# Patient Record
Sex: Female | Born: 1935 | Race: White | Hispanic: No | Marital: Married | Smoking: Never smoker
Health system: Southern US, Community
[De-identification: ages and names within clinical notes are randomized; demographics above are authoritative.]

## PROBLEM LIST (undated history)

## (undated) DIAGNOSIS — E7800 Pure hypercholesterolemia, unspecified: Secondary | ICD-10-CM

## (undated) DIAGNOSIS — J189 Pneumonia, unspecified organism: Secondary | ICD-10-CM

## (undated) DIAGNOSIS — I420 Dilated cardiomyopathy: Secondary | ICD-10-CM

## (undated) DIAGNOSIS — C801 Malignant (primary) neoplasm, unspecified: Secondary | ICD-10-CM

## (undated) DIAGNOSIS — M810 Age-related osteoporosis without current pathological fracture: Secondary | ICD-10-CM

## (undated) DIAGNOSIS — I471 Supraventricular tachycardia, unspecified: Secondary | ICD-10-CM

## (undated) DIAGNOSIS — K219 Gastro-esophageal reflux disease without esophagitis: Secondary | ICD-10-CM

## (undated) DIAGNOSIS — T4145XA Adverse effect of unspecified anesthetic, initial encounter: Secondary | ICD-10-CM

## (undated) DIAGNOSIS — D869 Sarcoidosis, unspecified: Secondary | ICD-10-CM

## (undated) DIAGNOSIS — M1990 Unspecified osteoarthritis, unspecified site: Secondary | ICD-10-CM

## (undated) DIAGNOSIS — Z9889 Other specified postprocedural states: Secondary | ICD-10-CM

## (undated) DIAGNOSIS — R112 Nausea with vomiting, unspecified: Secondary | ICD-10-CM

## (undated) DIAGNOSIS — I351 Nonrheumatic aortic (valve) insufficiency: Secondary | ICD-10-CM

## (undated) DIAGNOSIS — I890 Lymphedema, not elsewhere classified: Secondary | ICD-10-CM

## (undated) DIAGNOSIS — I2510 Atherosclerotic heart disease of native coronary artery without angina pectoris: Secondary | ICD-10-CM

## (undated) DIAGNOSIS — I5042 Chronic combined systolic (congestive) and diastolic (congestive) heart failure: Secondary | ICD-10-CM

## (undated) DIAGNOSIS — M7989 Other specified soft tissue disorders: Secondary | ICD-10-CM

## (undated) DIAGNOSIS — N281 Cyst of kidney, acquired: Secondary | ICD-10-CM

## (undated) DIAGNOSIS — I480 Paroxysmal atrial fibrillation: Secondary | ICD-10-CM

## (undated) DIAGNOSIS — I350 Nonrheumatic aortic (valve) stenosis: Secondary | ICD-10-CM

## (undated) DIAGNOSIS — I340 Nonrheumatic mitral (valve) insufficiency: Secondary | ICD-10-CM

## (undated) DIAGNOSIS — K589 Irritable bowel syndrome without diarrhea: Secondary | ICD-10-CM

## (undated) DIAGNOSIS — I10 Essential (primary) hypertension: Secondary | ICD-10-CM

## (undated) DIAGNOSIS — G709 Myoneural disorder, unspecified: Secondary | ICD-10-CM

## (undated) DIAGNOSIS — I700 Atherosclerosis of aorta: Secondary | ICD-10-CM

## (undated) HISTORY — DX: Dilated cardiomyopathy: I42.0

## (undated) HISTORY — DX: Sarcoidosis, unspecified: D86.9

## (undated) HISTORY — DX: Supraventricular tachycardia, unspecified: I47.10

## (undated) HISTORY — DX: Nonrheumatic mitral (valve) insufficiency: I34.0

## (undated) HISTORY — DX: Paroxysmal atrial fibrillation: I48.0

## (undated) HISTORY — DX: Chronic combined systolic (congestive) and diastolic (congestive) heart failure: I50.42

## (undated) HISTORY — DX: Atherosclerotic heart disease of native coronary artery without angina pectoris: I25.10

## (undated) HISTORY — DX: Cyst of kidney, acquired: N28.1

## (undated) HISTORY — DX: Nonrheumatic aortic (valve) insufficiency: I35.1

## (undated) HISTORY — PX: EYE SURGERY: SHX253

## (undated) HISTORY — DX: Irritable bowel syndrome, unspecified: K58.9

## (undated) HISTORY — DX: Supraventricular tachycardia: I47.1

## (undated) HISTORY — DX: Pneumonia, unspecified organism: J18.9

## (undated) HISTORY — DX: Unspecified osteoarthritis, unspecified site: M19.90

## (undated) HISTORY — DX: Age-related osteoporosis without current pathological fracture: M81.0

## (undated) HISTORY — DX: Pure hypercholesterolemia, unspecified: E78.00

## (undated) HISTORY — DX: Lymphedema, not elsewhere classified: I89.0

---

## 1995-05-14 HISTORY — PX: BREAST REDUCTION SURGERY: SHX8

## 1999-02-15 ENCOUNTER — Ambulatory Visit (HOSPITAL_COMMUNITY): Admission: RE | Admit: 1999-02-15 | Discharge: 1999-02-15 | Payer: Self-pay | Admitting: *Deleted

## 1999-02-15 ENCOUNTER — Encounter: Payer: Self-pay | Admitting: *Deleted

## 1999-03-14 ENCOUNTER — Encounter: Payer: Self-pay | Admitting: *Deleted

## 1999-03-14 ENCOUNTER — Ambulatory Visit (HOSPITAL_COMMUNITY): Admission: RE | Admit: 1999-03-14 | Discharge: 1999-03-14 | Payer: Self-pay | Admitting: *Deleted

## 1999-05-03 ENCOUNTER — Ambulatory Visit (HOSPITAL_COMMUNITY): Admission: RE | Admit: 1999-05-03 | Discharge: 1999-05-03 | Payer: Self-pay | Admitting: *Deleted

## 1999-05-03 ENCOUNTER — Encounter (INDEPENDENT_AMBULATORY_CARE_PROVIDER_SITE_OTHER): Payer: Self-pay | Admitting: *Deleted

## 2000-06-10 ENCOUNTER — Other Ambulatory Visit: Admission: RE | Admit: 2000-06-10 | Discharge: 2000-06-10 | Payer: Self-pay | Admitting: *Deleted

## 2002-07-12 ENCOUNTER — Ambulatory Visit (HOSPITAL_COMMUNITY): Admission: RE | Admit: 2002-07-12 | Discharge: 2002-07-12 | Payer: Self-pay | Admitting: Specialist

## 2005-12-19 ENCOUNTER — Other Ambulatory Visit: Admission: RE | Admit: 2005-12-19 | Discharge: 2005-12-19 | Payer: Self-pay | Admitting: Family Medicine

## 2008-12-27 ENCOUNTER — Ambulatory Visit: Payer: Self-pay | Admitting: Diagnostic Radiology

## 2008-12-27 ENCOUNTER — Encounter: Payer: Self-pay | Admitting: Emergency Medicine

## 2008-12-27 ENCOUNTER — Inpatient Hospital Stay (HOSPITAL_COMMUNITY): Admission: AD | Admit: 2008-12-27 | Discharge: 2008-12-31 | Payer: Self-pay | Admitting: Internal Medicine

## 2009-01-07 ENCOUNTER — Emergency Department (HOSPITAL_COMMUNITY): Admission: EM | Admit: 2009-01-07 | Discharge: 2009-01-07 | Payer: Self-pay | Admitting: Emergency Medicine

## 2010-05-16 ENCOUNTER — Emergency Department (HOSPITAL_COMMUNITY)
Admission: EM | Admit: 2010-05-16 | Discharge: 2010-05-17 | Payer: Self-pay | Source: Home / Self Care | Admitting: Emergency Medicine

## 2010-05-16 ENCOUNTER — Ambulatory Visit
Admission: RE | Admit: 2010-05-16 | Discharge: 2010-05-16 | Payer: Self-pay | Source: Home / Self Care | Attending: Orthopedic Surgery | Admitting: Orthopedic Surgery

## 2010-05-16 LAB — POCT I-STAT, CHEM 8
BUN: 25 mg/dL — ABNORMAL HIGH (ref 6–23)
Calcium, Ion: 1.08 mmol/L — ABNORMAL LOW (ref 1.12–1.32)
Chloride: 106 mEq/L (ref 96–112)
Creatinine, Ser: 1 mg/dL (ref 0.4–1.2)
Glucose, Bld: 196 mg/dL — ABNORMAL HIGH (ref 70–99)
HCT: 41 % (ref 36.0–46.0)
Hemoglobin: 13.9 g/dL (ref 12.0–15.0)
Potassium: 4.7 mEq/L (ref 3.5–5.1)
Sodium: 139 mEq/L (ref 135–145)
TCO2: 28 mmol/L (ref 0–100)

## 2010-05-16 LAB — POCT HEMOGLOBIN-HEMACUE: Hemoglobin: 14.3 g/dL (ref 12.0–15.0)

## 2010-05-26 ENCOUNTER — Encounter (INDEPENDENT_AMBULATORY_CARE_PROVIDER_SITE_OTHER): Payer: Self-pay | Admitting: Internal Medicine

## 2010-05-26 ENCOUNTER — Observation Stay (HOSPITAL_COMMUNITY)
Admission: EM | Admit: 2010-05-26 | Discharge: 2010-05-28 | Payer: Self-pay | Source: Home / Self Care | Attending: Internal Medicine | Admitting: Internal Medicine

## 2010-05-27 ENCOUNTER — Encounter (INDEPENDENT_AMBULATORY_CARE_PROVIDER_SITE_OTHER): Payer: Self-pay | Admitting: Internal Medicine

## 2010-05-28 LAB — HEMOGLOBIN A1C
Hgb A1c MFr Bld: 5.8 % — ABNORMAL HIGH (ref <5.7)
Mean Plasma Glucose: 120 mg/dL — ABNORMAL HIGH (ref <117)

## 2010-05-28 LAB — CBC
HCT: 39.9 % (ref 36.0–46.0)
HCT: 37.2 % (ref 36.0–46.0)
Hemoglobin: 12.1 g/dL (ref 12.0–15.0)
Hemoglobin: 11.6 g/dL — ABNORMAL LOW (ref 12.0–15.0)
MCH: 27.2 pg (ref 26.0–34.0)
MCH: 28.2 pg (ref 26.0–34.0)
MCHC: 30.3 g/dL (ref 30.0–36.0)
MCHC: 31.2 g/dL (ref 30.0–36.0)
MCV: 89.7 fL (ref 78.0–100.0)
MCV: 90.3 fL (ref 78.0–100.0)
Platelets: 267 10*3/uL (ref 150–400)
Platelets: 238 10*3/uL (ref 150–400)
RBC: 4.45 MIL/uL (ref 3.87–5.11)
RBC: 4.12 MIL/uL (ref 3.87–5.11)
RDW: 15 % (ref 11.5–15.5)
RDW: 15.3 % (ref 11.5–15.5)
WBC: 8.6 10*3/uL (ref 4.0–10.5)
WBC: 8 10*3/uL (ref 4.0–10.5)

## 2010-05-28 LAB — BASIC METABOLIC PANEL
BUN: 26 mg/dL — ABNORMAL HIGH (ref 6–23)
BUN: 18 mg/dL (ref 6–23)
CO2: 25 mEq/L (ref 19–32)
CO2: 27 mEq/L (ref 19–32)
Calcium: 9.4 mg/dL (ref 8.4–10.5)
Calcium: 8.6 mg/dL (ref 8.4–10.5)
Chloride: 101 mEq/L (ref 96–112)
Chloride: 110 mEq/L (ref 96–112)
Creatinine, Ser: 1.18 mg/dL (ref 0.4–1.2)
Creatinine, Ser: 1.29 mg/dL — ABNORMAL HIGH (ref 0.4–1.2)
GFR calc Af Amer: 54 mL/min — ABNORMAL LOW (ref >60)
GFR calc Af Amer: 49 mL/min — ABNORMAL LOW (ref >60)
GFR calc non Af Amer: 45 mL/min — ABNORMAL LOW (ref >60)
GFR calc non Af Amer: 40 mL/min — ABNORMAL LOW (ref >60)
Glucose, Bld: 126 mg/dL — ABNORMAL HIGH (ref 70–99)
Glucose, Bld: 104 mg/dL — ABNORMAL HIGH (ref 70–99)
Potassium: 3.9 mEq/L (ref 3.5–5.1)
Potassium: 4.2 mEq/L (ref 3.5–5.1)
Sodium: 134 mEq/L — ABNORMAL LOW (ref 135–145)
Sodium: 142 mEq/L (ref 135–145)

## 2010-05-28 LAB — DIFFERENTIAL
Basophils Absolute: 0 10*3/uL (ref 0.0–0.1)
Basophils Relative: 0 % (ref 0–1)
Eosinophils Absolute: 0.2 10*3/uL (ref 0.0–0.7)
Eosinophils Relative: 2 % (ref 0–5)
Lymphocytes Relative: 21 % (ref 12–46)
Lymphs Abs: 1.8 10*3/uL (ref 0.7–4.0)
Monocytes Absolute: 0.9 10*3/uL (ref 0.1–1.0)
Monocytes Relative: 10 % (ref 3–12)
Neutro Abs: 5.7 10*3/uL (ref 1.7–7.7)
Neutrophils Relative %: 66 % (ref 43–77)

## 2010-05-28 LAB — LIPID PANEL
Cholesterol: 168 mg/dL (ref 0–200)
HDL: 51 mg/dL (ref >39)
LDL Cholesterol: 98 mg/dL (ref 0–99)
Total CHOL/HDL Ratio: 3.3 RATIO
Triglycerides: 95 mg/dL (ref <150)
VLDL: 19 mg/dL (ref 0–40)

## 2010-05-28 LAB — PROTIME-INR
INR: 1.07 (ref 0.00–1.49)
Prothrombin Time: 14.1 seconds (ref 11.6–15.2)

## 2010-05-28 LAB — POCT CARDIAC MARKERS
CKMB, poc: 1.1 ng/mL (ref 1.0–8.0)
Myoglobin, poc: 92.2 ng/mL (ref 12–200)
Troponin i, poc: <0.05 ng/mL (ref 0.00–0.09)

## 2010-05-28 LAB — CK TOTAL AND CKMB (NOT AT ARMC)
CK, MB: 1.5 ng/mL (ref 0.3–4.0)
Relative Index: INVALID (ref 0.0–2.5)
Total CK: 40 U/L (ref 7–177)

## 2010-05-28 LAB — CARDIAC PANEL(CRET KIN+CKTOT+MB+TROPI)
CK, MB: 1.3 ng/mL (ref 0.3–4.0)
CK, MB: 1.3 ng/mL (ref 0.3–4.0)
Relative Index: INVALID (ref 0.0–2.5)
Relative Index: INVALID (ref 0.0–2.5)
Total CK: 39 U/L (ref 7–177)
Total CK: 38 U/L (ref 7–177)
Troponin I: 0.12 ng/mL — ABNORMAL HIGH (ref 0.00–0.06)
Troponin I: 0.11 ng/mL — ABNORMAL HIGH (ref 0.00–0.06)

## 2010-05-28 LAB — TROPONIN I: Troponin I: 0.17 ng/mL — ABNORMAL HIGH (ref 0.00–0.06)

## 2010-05-28 LAB — APTT: aPTT: 41 seconds — ABNORMAL HIGH (ref 24–37)

## 2010-05-30 LAB — BASIC METABOLIC PANEL
BUN: 16 mg/dL (ref 6–23)
CO2: 25 mEq/L (ref 19–32)
Calcium: 8.6 mg/dL (ref 8.4–10.5)
Chloride: 108 mEq/L (ref 96–112)
Creatinine, Ser: 1.23 mg/dL — ABNORMAL HIGH (ref 0.4–1.2)
GFR calc Af Amer: 51 mL/min — ABNORMAL LOW (ref >60)
GFR calc non Af Amer: 42 mL/min — ABNORMAL LOW (ref >60)
Glucose, Bld: 106 mg/dL — ABNORMAL HIGH (ref 70–99)
Potassium: 4 mEq/L (ref 3.5–5.1)
Sodium: 142 mEq/L (ref 135–145)

## 2010-05-30 LAB — CBC
HCT: 37.2 % (ref 36.0–46.0)
Hemoglobin: 11.6 g/dL — ABNORMAL LOW (ref 12.0–15.0)
MCH: 28.1 pg (ref 26.0–34.0)
MCHC: 31.2 g/dL (ref 30.0–36.0)
MCV: 90.1 fL (ref 78.0–100.0)
Platelets: 224 10*3/uL (ref 150–400)
RBC: 4.13 MIL/uL (ref 3.87–5.11)
RDW: 15.4 % (ref 11.5–15.5)
WBC: 7 10*3/uL (ref 4.0–10.5)

## 2010-05-30 LAB — MAGNESIUM: Magnesium: 2.3 mg/dL (ref 1.5–2.5)

## 2010-06-02 NOTE — H&P (Signed)
NAME:  DEDRICK, KOTAS             ACCOUNT NO.:  [id number]  MEDICAL RECORD NO.:  [id number]          PATIENT TYPE:  EMS  LOCATION:  MAJO                         FACILITY:  [facility]  PHYSICIAN:  Thornton Dales, MD   DATE OF BIRTH:  04/06/1932  DATE OF ADMISSION:  05/26/2010 DATE OF DISCHARGE:                             HISTORY & PHYSICAL   PRIMARY CARE PHYSICIAN:  Emeline General. White, MD  CHIEF COMPLAINT:  Chest discomfort and right leg swelling.  HISTORY OF PRESENT ILLNESS:  This is a pleasant 77 year old woman, who has no prior history of coronary artery disease.  She presented to the emergency room with feeling of indigestion and chest pressure, which is not accompanied with nausea or vomiting or diaphoresis.  In the emergency room, she got some treatment with improvement of pain.  EKG at initial evaluation was nondiagnostic for  myocardial infarction. Initial set of cardiac enzymes were also negative for myocardial injury or infarction.  The patient had arthroscopic surgery over 2 weeks ago for two torn cartilages and had been doing well postoperatively until the past few days when she noticed pain and swelling of the right leg. She denied any fever or trauma.  There is a concern that she may have developed deep vein thrombosis because of limited mobility.  The patient has never had prior history of myocardial infarction and she had been worked up in the past for coronary artery disease.  PAST MEDICAL HISTORY: 1. Osteoarthritis. 2. Obesity. 3. Irritable bowel syndrome. 4. Hypercholesterolemia. 5. Gastric reflux. 6. Cataract. 7. Allergies rhinitis.  She has no prior history of diabetes or high blood pressure.  MEDICATION: 1. Prilosec 20 mg daily. 2. Vitamin D3 one tablet daily. 3. Hydromorphone 2 mg q.4 to q.6 p.r.n. 4. Methocarbamol 500 mg t.i.d.  ALLERGIES:  None.  SOCIAL HISTORY:  No smoking or alcohol use.  The patient lives with husband.  Ten-point review of  systems is negative except as above.  FAMILY HISTORY:  Further died of heart attack in his early 52's.  PHYSICAL EXAMINATION:  GENERAL:  The patient is lying in bed comfortably in no distress. VITAL SIGNS:  Blood pressure is 133/91, pulse 77, respirations 20, temperature 97.8, O2 sat is 97% on room air. HEENT:  Pallor. NECK:  Supple.  No JVD. LUNGS:  Clear to auscultation bilaterally.  No wheezing or crackles. HEART:  S1, S2 normal.  No murmurs, rubs, or gallops. ABDOMEN:  Full, soft, obese, nontender.  Bowel sounds present.  No masses. EXTREMITIES:  No edema, clubbing, or cyanosis.  There is no edema or cyanosis on the left side, but on the right side, there is some swelling of the right leg and calf region.  There is no erythema.  The right knee also appear mildly swollen.  LABORATORY:  White count is normal at 8.6, hemoglobin 12.4, platelet count is 267.  Chemistries:  Sodium is 134, potassium 3.9, BUN 26, creatinine 1.18, glucose is 126.  Cardiac enzymes x1 is negative.  Chest x-ray showed left scarring, right thoracic spondylosis, no acute findings.  EKG showed normal sinus rhythm with no evidence of ischemia.  ASSESSMENT:  This  is a 77 year old woman admitted with: 1. Chest discomfort, rule out acute coronary syndrome.  Her risk     factors include hyperlipidemia and high blood pressure.  She has no     prior history of high blood pressure, but her blood pressure today     is 133/91.  It is likely that pain might be responsible for this,     but as described, she has underlying high blood pressure that is     currently not being treated.  In view of possible coronary artery     disease risk factors, the patient will be admitted as observation     for serial cardiac enzymes and EKG.  She will also get stress echo     if enzymes are negative.  We will put her on aspirin, p.r.n.     nitroglycerin, and keep her on telemetry floor. 2. Newly diagnosed high blood pressure:  The  patient will be started     on Lopressor 25 mg b.i.d. 3. Right leg swelling:  To rule out deep venous thrombosis.  The     patient got one dose of Lovenox in the emergency room.  I will do the protection of right leg, keep deep venous thrombosis prophylaxis for now.  If the deep venous thrombosis is confirmed, she can be changed to full-dose Lovenox. 1. Hyperlipidemia:  Lipid panel will be checked.  She is currently not     on any anti-lipid medication.  This will probably be started if her     LDL is not at the target, as excepted it takes about 1-2 days.     Thornton Dales, MD     FA/MEDQ  D:  05/26/2010  T:  05/26/2010  Job:  [id number]  Electronically Signed by Thornton Dales  on 06/02/2010 10:46:58 PM

## 2010-06-07 NOTE — Consult Note (Addendum)
NAME:  Heather Benson, Heather Benson             ACCOUNT NO.:  [id number]  MEDICAL RECORD NO.:  [id number]          PATIENT TYPE:  OBV  LOCATION:  4729                         FACILITY:  [facility]  PHYSICIAN:  Minus Breeding, MD, FACCDATE OF BIRTH:  04/19/1932  DATE OF CONSULTATION:  05/26/2010 DATE OF DISCHARGE:                                CONSULTATION   PRIMARY CARE PHYSICIAN:  Emeline General. White, MD  CARDIOLOGIST:  None.  REASON FOR CONSULTATION:  Evaluate the patient with elevated troponin.  HISTORY OF PRESENT ILLNESS:  The patient is a 77 year old white female without prior cardiac history.  She came to the ER yesterday after an episode of "indigestion" while lying in bed.  It "scared her."  It was 5/10 in intensity.  There was a heaviness to it.  It was substernal. There was no radiation to her jaw or to her arms.  She never had anything like this before.  There was no nausea, vomiting, diaphoresis, shortness of breath, or other associated symptoms.  Prior to get into the emergency room, she took some Tums without help.  In the emergency room, a GI cocktail did not improve her symptoms.  She did not have any acute ST-segment changes.  However, troponins have been slightly elevated initially at 0.17, drifting down to 0.11.  CK-MB has been negative x2.  Of note, the patient has had recent arthroscopic right knee surgery. She has been slow to recover from this.  She had been ambulating with a walker.  She has had some right lower leg anterior tibial discomfort with walking.  However, she is not describing any calf tenderness or swelling.  PAST MEDICAL HISTORY:  Osteoarthritis, irritable bowel syndrome, obesity, renal cyst, melanoma, polio with left leg weakness, gastroesophageal reflux disease, osteoporosis.  PAST SURGICAL HISTORY:  Melanoma resected, foot surgery many years ago, partial hysterectomy, arthroscopic knee surgery.  ALLERGIES AND INTOLERANCES:  CODEINE.  MEDICATIONS  (PRIOR TO ADMISSION):  Prilosec, vitamin D3, hydromorphone, methocarbamol.  SOCIAL HISTORY:  The patient lives with her husband.  She never smoked cigarettes.  FAMILY HISTORY:  Father had "heart trouble" in his 42s.  Her mother died at later age with heart failure.  REVIEW OF SYSTEMS:  As stated in the HPI and positive for diarrhea. Negative for all other systems.  PHYSICAL EXAMINATION:  GENERAL:  The patient is pleasant and in no distress. VITAL SIGNS:  Blood pressure 125/64, heart rate 66 and regular, afebrile, respiratory rate 16, 96% saturation on room air. HEENT:  Eyes unremarkable.  Pupils equal, round, and reactive to light. Fundi not visualized.  Oral mucosa unremarkable.  Upper dentures. NECK:  No jugular venous distention at 45 degrees.  Carotid upstroke brisk and symmetrical.  No bruits or thyromegaly. LYMPHATICS:  No cervical, axillary, inguinal adenopathy. LUNGS:  Clear to auscultation bilaterally. BACK:  No costovertebral angle tenderness. CHEST:  Unremarkable. HEART:  PMI not displaced or sustained.  S1 and S2 within normal limits. No S3, no S4, 2/6 apical brief systolic murmur, radiating slightly at the aortic outflow tract, no diastolic murmurs. ABDOMEN:  Obese, positive bowel sounds, normal in frequency and pitch, no bruits, no rebound,  no guarding, no midline pulsatile mass, no hepatomegaly, no splenomegaly. SKIN:  No rashes, no nodules. EXTREMITIES:  2+ pulses, mild swelling right greater than left lower leg without calf tenderness, mild tenderness to palpation, anterior tibia. NEUROLOGIC:  Oriented to person, place, and time.  Cranial nerves II-XII grossly intact.  Motor grossly intact.  EKG, normal sinus rhythm, rate 61, left ventricular hypertrophy, left axis deviation, inferior T-wave inversions, possible ischemia.  Chest x-ray, basilar scarring with tortuous aorta.  LABS:  Sodium 134, potassium 0.9, BUN 26, creatinine 1.18.  WBC 8.6, hemoglobin 12.1,  platelets 267.  Hemoglobin A1c 5.8.  ASSESSMENT AND PLAN: 1. Chest discomfort.  The patient's chest discomfort does have some     typical features.  She does have a mildly elevated troponin.  With     her recent orthopedic surgery and lower leg discomfort, we need to     first think about pulmonary emboli.  I think probability is rather     high and so I would proceed directly to CT to exclude this.  She is     being treated currently with aspirin and Lovenox.  She is not     currently having any pain although nitroglycerin has been added.     If her chest CT does not demonstrate a pulmonary emboli, I will     suggest cardiac catheterization. 2. Dyslipidemia.  She needs a lipid profile and risk reduction 3. Obesity.  She can be counseled about this as she recovers from her     knee surgery.     Minus Breeding, MD, [facility]     JH/MEDQ  D:  05/26/2010  T:  05/27/2010  Job:  [id number]  cc:   Emeline General. Dema Severin, M.D.  Electronically Signed by Minus Breeding MD [facility] on 06/07/2010 12:29:17 PM

## 2010-06-07 NOTE — Discharge Summary (Addendum)
NAME:  Heather Benson, Heather Benson             ACCOUNT NO.:  [id number]  MEDICAL RECORD NO.:  [id number]          PATIENT TYPE:  EMS  LOCATION:  ED                           FACILITY:  [facility]  PHYSICIAN:  Hosie Poisson, MD       DATE OF BIRTH:  07-May-1932  DATE OF ADMISSION:  05/16/2010 DATE OF DISCHARGE:  05/17/2010                              DISCHARGE SUMMARY   PRIMARY CARE PHYSICIAN:  Dr. Vidal Schwalbe, [facility].  DISCHARGE DIAGNOSIS:  Chest pain.  OTHER DIAGNOSES: 1. Osteoarthritis, both knee. 2. Irritable bowel syndrome. 3. Hyperlipidemia. 4. Gastroesophageal reflux disease. 5. Allergic rhinitis.  DISCHARGE MEDICATIONS: 1. Protonix 40 mg daily. 2. Methocarbamol 500 mg t.i.d. 3. Hydromorphone 2 mg q.6 h. p.r.n. 4. Metoprolol 25 mg daily. 5. Aspirin 81 mg daily.  CONSULTS CALLED:  Cardiology consults and [facility].  PROCEDURES DONE: 1. Cardiac catheterization. 2. Venous duplex of the right lower extremity.  PERTINENT LABORATORY FINDINGS:  CBC done on May 26, 2010, which was pretty much normal and a basic metabolic panel which showed a sodium of 134, potassium of 3.9, chloride of 101, bicarb of 21, glucose of 126, BUN of 26 and creatinine of 1.18.  Point-of-care cardiac markers first set was negative.  CK-MB was 1.5, troponin was 0.17 which was high. Next set of troponin was therefore 0.2 with normal CK-MB.  The third CK- MB was 1.3 with troponin level of 0.11.  Hemoglobin A1c of 5.8.  Lipid profile showed an LDL of 98, HDL of 51, PT/INR was 1.01.  RADIOLOGY:  She had a CT and a chest x-ray which showed thoracic spondylosis, suspected left basilar scarring and tortuous aorta.  She had a CT angiogram of the chest which showed scattered atherosclerotic calcifications of the aorta, small hiatal hernia pulmonary arteries patent without evidence of pulmonary embolism.  No aortic dissections needed.  Density at the center wall of descending thoracic aorta  towards atelectatic lower lobe tissues enlarged point.  No evidence of pulmonary embolism.  No definite evidence of aneurysm or dissection of the thoracic aorta.  She had an x-ray of the tibia and fibula done on May 27, 2010, which could not show any acute bony abnormality.  BRIEF HOSPITAL COURSE: 1. This is a 77 year old lady with no history of coronary artery     disease who presented to ER with chest pressure.  She was worked up     for acute coronary syndrome.  Her initial cardiac enzyme has been     negative.  The second set of cardiac enzymes, the values of     troponin were high and I am prompting her to get a Cardiology     consult.  Her EKG was normal with sinus rhythm.  Prior to     Cardiology consult who recommended the CT angiogram to rule out PE     which was negative.  The patient later underwent cardiac     catheterization which showed clean coronary arteries.  The chest     pain was thought to be accounted from the gastroesophageal reflux     disease.  The patient was  previously on ranitidine for her acid     reflux which was stopped and she was discharged home on Protonix 40     mg daily. 2. She had right lower extremity swelling and pain.  The patient had a     recent right knee cartilage repair when she was in the hospital.     She was tested for right lower extremity DVT, got a venous Doppler     of her lower extremity, did not show a DVT, but the Baker cyst and     it did not rupture.  Later, we got an x-ray of this as she was     complaining of some pain, did not show any acute bony abnormality.     I asked the patient to elevate the legs, gave her pain medication     and on the day of discharge, the patient's vitals, she had a temp     of 98, pulse of 51 per minute, respirations of 24 per minute, blood     pressure 148/75, saturating 95% on room air.  She was     hemodynamically stable.  She was alert, afebrile and oriented x3.     Cardiovascular, S1 and S2  heard.  Respiratory exam, good air entry     bilateral.  Abdominal exam, soft, nontender, nondistended and bowel     sounds were good.  Extremities, no pedal edema.  The patient was     stable to be discharged home to be followed with Dr. Caren Griffins in     another week or two and with Cardiology as needed.          ______________________________ Hosie Poisson, MD     VA/MEDQ  D:  06/05/2010  T:  06/05/2010  Job:  [id number]  Electronically Signed by Hosie Poisson MD on 06/07/2010 11:05:19 PM

## 2010-07-20 NOTE — Procedures (Signed)
NAME:  Heather Benson, Heather Benson             ACCOUNT NO.:  [id number]  MEDICAL RECORD NO.:  [id number]          PATIENT TYPE:  OBV  LOCATION:  [id number]                         FACILITY:  [place]  PHYSICIAN:  Jettie Booze, MDDATE OF BIRTH:  March 13, 1932  DATE OF PROCEDURE:  05/28/2010 DATE OF DISCHARGE:  05/28/2010                           CARDIAC CATHETERIZATION   PRIMARY CARE DOCTOR:  Emeline General. White, MD  PROCEDURE PERFORMED:  Left heart catheterization, left ventriculogram, coronary angiogram, abdominal aortogram.  OPERATOR:  Jettie Booze, MD  INDICATIONS:  Unstable angina, abnormal troponin.  PROCEDURAL NARRATIVE:  The risks and benefits of cardiac catheterization were explained to the patient.  Informed consent was obtained.  She was brought to the cath lab.  She was prepped and draped in the usual sterile fashion.  Her right wrist was infiltrated with 1% lidocaine.  A 5-French glide sheath was placed into the right radial artery using the modified Seldinger technique.  Right coronary artery angiography was performed using a JR-4.0 Pigtail catheter.  The catheter was advanced to about the vessel ostium under fluoroscopic guidance.  Digital angiography was performed in multiple projections using hand injection of contrast.  Left coronary artery angiography was eventually performed with a JL-3.5 guiding catheter in a similar fashion.  A pigtail catheter was advanced to the ascending aorta and across the aortic valve under fluoroscopic guidance.  Power injection of contrast was performed in the RAO projection to image the left ventricle.  The catheter was then pulled back under continuous hemodynamic pressure monitoring.  It was then advanced to the abdominal aorta.  A power injection of contrast was performed in the AP projection to image the abdominal aorta.  The sheath was removed, and a TR band was placed for hemostasis.  FINDINGS: 1. The right coronary artery is a large  dominant vessel which appears     angiographically normal.  There is a medium-sized posterolateral     artery and PDA which are widely patent. 2. Left main artery is widely patent. 3. Left circumflex is a large vessel.  The OM-1 and OM-2 are medium-     sized vessels, all of these are widely patent.  The continuation     branch in the AV groove is also widely patent. 4. Left anterior descending has mild irregularities in the midvessel.     There are three small diagonals, all of which are widely patent. 5. Left ventriculogram shows normal ventricular function with an     estimated ejection fraction of 60%.  There is significant aortic     annular calcification.  HEMODYNAMICS: 1. Left ventricular pressure 149/40 with an LVEDP of 21 mmHg.  Aortic     pressure 146/64 with a mean aortic pressure of 96 mmHg. 2. Abdominal aortogram showed mild abdominal aortic atherosclerosis     and some ectasia.  IMPRESSION: 1. Mild coronary artery disease.  No significant stenoses. 2. Normal left ventricular function. 3. Mildly increased left ventricular end-diastolic pressure. 4. Small area of infrarenal aortic ectasia with aortic     atherosclerosis.  RECOMMENDATIONS:  The patient needs aggressive preventive therapy and risk factor modification.  It  is unclear what caused her abnormal troponin.  No further cardiac workup is needed at this time.  She has also had a workup for pulmonary embolus which was negative.  From a cardiac standpoint, she is okay be discharged.     Jettie Booze, MD     JSV/MEDQ  D:  05/28/2010  T:  05/29/2010  Job:  [id number]  Electronically Signed by Larae Grooms MD on 07/19/2010 09:59:21 AM

## 2010-08-18 LAB — DIFFERENTIAL
Basophils Absolute: 0 10*3/uL (ref 0.0–0.1)
Basophils Absolute: 0 10*3/uL (ref 0.0–0.1)
Basophils Relative: 0 % (ref 0–1)
Eosinophils Absolute: 0.2 10*3/uL (ref 0.0–0.7)
Eosinophils Relative: 2 % (ref 0–5)
Lymphocytes Relative: 10 % — ABNORMAL LOW (ref 12–46)
Lymphocytes Relative: 6 % — ABNORMAL LOW (ref 12–46)
Lymphs Abs: 0.8 10*3/uL (ref 0.7–4.0)
Monocytes Relative: 4 % (ref 3–12)
Neutro Abs: 11.9 10*3/uL — ABNORMAL HIGH (ref 1.7–7.7)
Neutro Abs: 8.8 10*3/uL — ABNORMAL HIGH (ref 1.7–7.7)
Neutrophils Relative %: 80 % — ABNORMAL HIGH (ref 43–77)
Neutrophils Relative %: 84 % — ABNORMAL HIGH (ref 43–77)
Neutrophils Relative %: 87 % — ABNORMAL HIGH (ref 43–77)

## 2010-08-18 LAB — CBC
Hemoglobin: 11.8 g/dL — ABNORMAL LOW (ref 12.0–15.0)
Hemoglobin: 13.9 g/dL (ref 12.0–15.0)
MCHC: 33.2 g/dL (ref 30.0–36.0)
MCHC: 33.8 g/dL (ref 30.0–36.0)
MCV: 87.1 fL (ref 78.0–100.0)
MCV: 88.4 fL (ref 78.0–100.0)
Platelets: 326 10*3/uL (ref 150–400)
RBC: 4.03 MIL/uL (ref 3.87–5.11)
RDW: 14.6 % (ref 11.5–15.5)
RDW: 15.5 % (ref 11.5–15.5)
WBC: 10.6 10*3/uL — ABNORMAL HIGH (ref 4.0–10.5)

## 2010-08-18 LAB — URINALYSIS, ROUTINE W REFLEX MICROSCOPIC
Bilirubin Urine: NEGATIVE
Glucose, UA: NEGATIVE mg/dL
Ketones, ur: NEGATIVE mg/dL
Nitrite: NEGATIVE
Specific Gravity, Urine: 1.004 — ABNORMAL LOW (ref 1.005–1.030)
Specific Gravity, Urine: 1.007 (ref 1.005–1.030)
Urobilinogen, UA: 0.2 mg/dL (ref 0.0–1.0)
pH: 5.5 (ref 5.0–8.0)

## 2010-08-18 LAB — CLOSTRIDIUM DIFFICILE EIA: C difficile Toxins A+B, EIA: NEGATIVE

## 2010-08-18 LAB — BASIC METABOLIC PANEL
BUN: 16 mg/dL (ref 6–23)
CO2: 25 mEq/L (ref 19–32)
Calcium: 8.5 mg/dL (ref 8.4–10.5)
Calcium: 9.3 mg/dL (ref 8.4–10.5)
Chloride: 110 mEq/L (ref 96–112)
Chloride: 113 mEq/L — ABNORMAL HIGH (ref 96–112)
GFR calc Af Amer: 46 mL/min — ABNORMAL LOW (ref >60)
GFR calc non Af Amer: 41 mL/min — ABNORMAL LOW (ref >60)
GFR calc non Af Amer: 53 mL/min — ABNORMAL LOW (ref >60)
Glucose, Bld: 104 mg/dL — ABNORMAL HIGH (ref 70–99)
Glucose, Bld: 117 mg/dL — ABNORMAL HIGH (ref 70–99)
Glucose, Bld: 148 mg/dL — ABNORMAL HIGH (ref 70–99)
Potassium: 4.1 mEq/L (ref 3.5–5.1)
Potassium: 4.6 mEq/L (ref 3.5–5.1)
Sodium: 139 mEq/L (ref 135–145)
Sodium: 140 mEq/L (ref 135–145)

## 2010-08-18 LAB — COMPREHENSIVE METABOLIC PANEL
ALT: 24 U/L (ref 0–35)
ALT: 30 U/L (ref 0–35)
AST: 34 U/L (ref 0–37)
BUN: 33 mg/dL — ABNORMAL HIGH (ref 6–23)
CO2: 23 mEq/L (ref 19–32)
Calcium: 8 mg/dL — ABNORMAL LOW (ref 8.4–10.5)
Calcium: 9.2 mg/dL (ref 8.4–10.5)
Chloride: 107 mEq/L (ref 96–112)
Creatinine, Ser: 2.6 mg/dL — ABNORMAL HIGH (ref 0.4–1.2)
GFR calc Af Amer: 22 mL/min — ABNORMAL LOW (ref >60)
GFR calc non Af Amer: 18 mL/min — ABNORMAL LOW (ref >60)
GFR calc non Af Amer: 18 mL/min — ABNORMAL LOW (ref >60)
Glucose, Bld: 122 mg/dL — ABNORMAL HIGH (ref 70–99)
Glucose, Bld: 150 mg/dL — ABNORMAL HIGH (ref 70–99)
Sodium: 136 mEq/L (ref 135–145)
Sodium: 138 mEq/L (ref 135–145)
Total Bilirubin: 0.8 mg/dL (ref 0.3–1.2)
Total Protein: 8.1 g/dL (ref 6.0–8.3)

## 2010-08-18 LAB — LEGIONELLA ANTIGEN, URINE

## 2010-08-18 LAB — URINE CULTURE
Colony Count: NO GROWTH
Culture: NO GROWTH

## 2010-08-18 LAB — URINE MICROSCOPIC-ADD ON

## 2010-09-25 NOTE — H&P (Signed)
NAMEMarland Kitchen  Heather Benson, ERPS             ACCOUNT NO.:  [id number]   MEDICAL RECORD NO.:  [id number]          PATIENT TYPE:  INP   LOCATION:  1502                         FACILITY:  [facility]   PHYSICIAN:  Heather Benson, MDDATE OF BIRTH:  06-14-1931   DATE OF ADMISSION:  12/28/2008  DATE OF DISCHARGE:                              HISTORY & PHYSICAL   PRIMARY CARE PHYSICIAN:  Heather Benson, M.D.   CHIEF COMPLAINT:  Diarrhea, nausea, vomiting, cough, low grade fevers at  home currently improving.   HISTORY OF PRESENT ILLNESS:  The patient started to have symptoms about  a week ago, so started mainly with nausea, vomiting and diarrhea.  She  presented to her primary care Heather Benson.  One of the things that was  checked was UA which showed to be dirty.  She was started given dose of  Rocephin and Phenergan and started on Cipro for possible pyelonephritis.  Later on, the urine culture was negative, but two days ago she started  to cough productive of brown sputum.  Continued to have some vomiting,  although her diarrhea started to improve.  No dysuria or frequency,  started to feel lightheaded when she stood up.  Eventually, she  presented back to her primary care Heather Benson who thought that she was  probably most likely dehydrated and sending to [facility] ED from where  on she was directly admitted to the floor.  One chest x-ray showed  possible pneumonia.  Last fever per the patient was up to 101.8 and that  was probably on the 16th.  No chest pain or shortness of breath.  Otherwise, review of systems unremarkable except for his HPI.   PAST MEDICAL HISTORY:  1. Allergic rhinitis.  2. Irritable bowel syndrome.  3. Osteoarthritis.  4. Right renal cyst.  5. Osteoporosis.  6. Status post melanoma removal.  7. GERD.  8. Obesity.  9. History of polio with left leg weakness.   SOCIAL HISTORY:  The patient never smoked, does not drink, does not  abuse drugs.  Lives at home with her  husband.   FAMILY HISTORY:  Significant for coronary artery disease and  hypertension as well as colon cancer and breast cancer in her sister.   ALLERGIES:  CODEINE.   MEDICATIONS:  She recently was started on Cipro, but otherwise, only  takes Centrum Silver daily, fish oil daily, vitamin D and Zyrtec daily  as needed.   PHYSICAL EXAMINATION:  VITALS:  Temperature 98.9, blood pressure 99/64,  pulse 79, respirations 18, satting 97% on 2 liters.  GENERAL:  The patient appears to be in no acute distress, currently not  orthostatic.  HEENT:  Head:  Nontraumatic.  Somewhat dry mucous membranes but normal  skin turgor.  LUNGS:  Clear to auscultation bilaterally.  HEART:  Regular rate and rhythm.  No murmurs appreciated.  ABDOMEN:  Obese but soft, nontender, nondistended.  LOWER EXTREMITIES:  Slightly obese, but also no edema noted.  NEUROLOGICAL:  Grossly intact.   LABORATORY DATA:  Benson blood cell count 13.6, hemoglobin 13.9.  Sodium  136, potassium 4.5, creatinine 2.6.  Total bilirubin 1.7, alk phos 54.  UA showing many bacteria and 11-20  Benson blood cells.  Chest x-ray showing questionable left lower lobe  pneumonia versus atelectasis and right perihilar density pneumonia  versus mass.  Recommend follow-up chest x-ray after her pneumonia is  clear.   ASSESSMENT/PLAN:  This is a 77 year old female with cough, fever,  diarrhea with questionable pneumonia and then urinary tract infection.  1. Pneumonia.  Will treat with Levaquin as this should cover both her      urine and pneumonia.  Her UA is still showing many bacteria which      makes UA still a possibility.  __________should cover while      atypical pneumonia and possible Legionella.  Will send for      Legionella screen.  Will send for sputum cultures.  The patient is      currently afebrile.  Given the initial EKG, she was somewhat      tachycardiac with low blood pressures.  Will put her on telemetry      for further  evaluation with frequent vitals.  2. Questionable urinary tract infection.  Will await urine culture,      but for right now, will ensure she is on Levaquin.  Given her      insufficiency, will dose Levaquin per pharmacy.  3. Acute renal failure.  Will check renal ultrasound.  Will check      urine lytes.  Give IV fluids.  4. Slightly elevated LFTs.  Will follow and recheck.  5. Normal chest x-ray, also likely pneumonia based on her clinical      presentation, but will need to have follow-up chest x-ray and      possibly even CT scan once she becomes asymptomatic.  6. Prophylaxis.  Protonix plus Lovenox.  7. Diarrhea.  Since she had exposure to antibiotics, will check for C      difficile, although doubt that this is C difficile.  Will send      stool culture, stool Benson blood cell count.  She does have history      of irritable bowel syndrome which is a possibility in this      situation.  8. Dehydration.  Will give IV fluids.      Heather Has, MD  Electronically Signed     AVD/MEDQ  D:  12/28/2008  T:  12/28/2008  Job:  [id number]   cc:   Heather General. Dema Severin, M.D.  Fax: [phone]

## 2010-09-28 NOTE — Discharge Summary (Signed)
NAMEMarland Kitchen  Heather Benson, Heather Benson             ACCOUNT NO.:  [id number]   MEDICAL RECORD NO.:  [id number]          PATIENT TYPE:  INP   LOCATION:  [place]                         FACILITY:  [facility]   PHYSICIAN:  Corinna L. Conley Canal, MDDATE OF BIRTH:  1932/03/30   DATE OF ADMISSION:  12/27/2008  DATE OF DISCHARGE:  12/31/2008                               DISCHARGE SUMMARY   DISCHARGE DIAGNOSES:  1. Bilateral pneumonia.  2. Resolved diarrhea, antibiotic related.  3. Resolved nausea.  4. History of recurrent/chronic diarrhea secondary to irritable bowel      syndrome.  5. Acute renal failure secondary to dehydration.  6. Osteoarthritis.  7. Osteoporosis.  8. History of malignant melanoma status post resection.  9. Gastroesophageal reflux disease.  10.History of polio with left leg weakness.   DISCHARGE MEDICATIONS:  1. Ceftin 500 mg twice a day for 3 more days.  2. Azithromycin 500 mg a day for 3 more days.  3. Continue multivitamin, fish oil capsules, vitamin D and  Zyrtec as      previous.   CONDITION:  Stable.   FOLLOW UP:  Follow up with Dr. Dema Severin in 2 weeks.  Will need a repeat PA  and lateral chest x-ray in  4-6 weeks.  If still abnormal, consider CT of the chest.   ACTIVITY:  Increase slowly.   DIET:  As tolerated.   CONSULTATIONS:  None.   PROCEDURE:  None.   LABORATORY DATA:  On admission:  White blood cell count was 10,600 with  80% neutrophils, 11% lymphocytes.  Basic metabolic panel on admission  significant for a BUN of 30, creatinine 2.6.  At discharge, her BUN is  13, creatinine 1.28.  Liver function tests significant for an albumin of  2.5.  Urine Legionella negative.  C. diff negative x2.   DIAGNOSTICS:  1. Two views of the chest on admission showed right perihilar density      may be due to pneumonia or mass lesion, left lower lobe airspace      disease, atelectasis versus infiltrate, possible left lower lobe      nodule.  Findings most likely due to pneumonia, but  followup until      clearing is suggested.  2. Renal ultrasound showed no hydronephrosis, 2.9 cm right renal cyst.  3. Abdominal films showed nonspecific bowel gas pattern.   HISTORY/HOSPITAL COURSE:  Heather Benson is a pleasant 77 year old white  female patient of Dr. [name], who had about a week's worth of  nausea, vomiting and diarrhea.  She had an urinary tract infection  diagnosed prior to admission which was treated with Cipro for concern of  her pyelonephritis.  Several days after the onset of those symptoms, she  began a cough productive of brown sputum.  She went back to her primary  care physician and had evidence of clinical dehydration.  She was  subsequently seen in the [facility] Emergency Room and transferred to  the floor at [facility].  Chest x-ray showed pneumonia.  She had fevers  of 101 and above the week prior to admission.  On admission, she had  normal vital signs.  She was in no acute distress.  She had dry mucous  membranes.  Clear lungs, regular heart rate and rhythm.  Abdomen was  soft, nontender.  She was admitted to treat pneumonia, vomiting,  diarrhea, acute renal failure secondary to dehydration.  She was started  on Levaquin.  She continued to have diarrhea.  This was most likely  worsened by Levaquin and she was switched to Rocephin and azithromycin.  Her diarrhea resolved and she was negative for C. diff toxin.  She  tolerated the Rocephin and azithromycin better.  At the time of  discharge, her renal failure had resolved.  Her vomiting and diarrhea  had resolved.  She was feeling much stronger and ready to go home.  She  continued to have a normal vital signs.  She was given IV hydration.  She will need a repeat chest x-ray as an outpatient to ensure  resolution.      Corinna L. Conley Canal, MD  Electronically Signed     CLS/MEDQ  D:  01/10/2009  T:  01/10/2009  Job:  [id number]

## 2011-02-05 ENCOUNTER — Encounter (HOSPITAL_COMMUNITY): Payer: Medicare Other

## 2011-02-05 ENCOUNTER — Other Ambulatory Visit: Payer: Self-pay | Admitting: Orthopedic Surgery

## 2011-02-05 ENCOUNTER — Other Ambulatory Visit (HOSPITAL_COMMUNITY): Payer: Self-pay | Admitting: Orthopedic Surgery

## 2011-02-05 ENCOUNTER — Ambulatory Visit (HOSPITAL_COMMUNITY)
Admission: RE | Admit: 2011-02-05 | Discharge: 2011-02-05 | Disposition: A | Discharge status: Home / Self Care | Payer: Medicare Other | Source: Ambulatory Visit | Attending: Orthopedic Surgery | Admitting: Orthopedic Surgery

## 2011-02-05 DIAGNOSIS — Z01811 Encounter for preprocedural respiratory examination: Secondary | ICD-10-CM

## 2011-02-05 DIAGNOSIS — Z01818 Encounter for other preprocedural examination: Secondary | ICD-10-CM | POA: Insufficient documentation

## 2011-02-05 LAB — COMPREHENSIVE METABOLIC PANEL
ALT: 18 U/L (ref 0–35)
AST: 24 U/L (ref 0–37)
Alkaline Phosphatase: 58 U/L (ref 39–117)
CO2: 27 mEq/L (ref 19–32)
Calcium: 9.9 mg/dL (ref 8.4–10.5)
Potassium: 4.5 mEq/L (ref 3.5–5.1)
Sodium: 140 mEq/L (ref 135–145)
Total Protein: 8 g/dL (ref 6.0–8.3)

## 2011-02-05 LAB — CBC
HCT: 42.4 % (ref 36.0–46.0)
Hemoglobin: 13.2 g/dL (ref 12.0–15.0)
RBC: 4.64 MIL/uL (ref 3.87–5.11)
RDW: 15.1 % (ref 11.5–15.5)
WBC: 8.4 10*3/uL (ref 4.0–10.5)

## 2011-02-05 LAB — URINALYSIS, ROUTINE W REFLEX MICROSCOPIC
Glucose, UA: NEGATIVE mg/dL
Hgb urine dipstick: NEGATIVE
Ketones, ur: NEGATIVE mg/dL
pH: 5.5 (ref 5.0–8.0)

## 2011-02-05 LAB — URINE MICROSCOPIC-ADD ON

## 2011-02-05 LAB — PROTIME-INR: Prothrombin Time: 13.8 seconds (ref 11.6–15.2)

## 2011-02-11 ENCOUNTER — Inpatient Hospital Stay (HOSPITAL_COMMUNITY)
Admission: RE | Admit: 2011-02-11 | Discharge: 2011-02-18 | DRG: 470 | Disposition: A | Discharge status: Home Health | Payer: Medicare Other | Source: Ambulatory Visit | Attending: Orthopedic Surgery | Admitting: Orthopedic Surgery

## 2011-02-11 DIAGNOSIS — Z01812 Encounter for preprocedural laboratory examination: Secondary | ICD-10-CM

## 2011-02-11 DIAGNOSIS — I519 Heart disease, unspecified: Secondary | ICD-10-CM | POA: Diagnosis not present

## 2011-02-11 DIAGNOSIS — I2510 Atherosclerotic heart disease of native coronary artery without angina pectoris: Secondary | ICD-10-CM | POA: Diagnosis present

## 2011-02-11 DIAGNOSIS — I248 Other forms of acute ischemic heart disease: Secondary | ICD-10-CM | POA: Diagnosis not present

## 2011-02-11 DIAGNOSIS — K219 Gastro-esophageal reflux disease without esophagitis: Secondary | ICD-10-CM | POA: Diagnosis present

## 2011-02-11 DIAGNOSIS — I959 Hypotension, unspecified: Secondary | ICD-10-CM | POA: Diagnosis not present

## 2011-02-11 DIAGNOSIS — I4891 Unspecified atrial fibrillation: Secondary | ICD-10-CM | POA: Diagnosis not present

## 2011-02-11 DIAGNOSIS — Y9210 Unspecified residential institution as the place of occurrence of the external cause: Secondary | ICD-10-CM | POA: Diagnosis present

## 2011-02-11 DIAGNOSIS — Z7982 Long term (current) use of aspirin: Secondary | ICD-10-CM

## 2011-02-11 DIAGNOSIS — K589 Irritable bowel syndrome without diarrhea: Secondary | ICD-10-CM | POA: Diagnosis present

## 2011-02-11 DIAGNOSIS — M1710 Unilateral primary osteoarthritis, unspecified knee: Principal | ICD-10-CM | POA: Diagnosis present

## 2011-02-11 DIAGNOSIS — Z79899 Other long term (current) drug therapy: Secondary | ICD-10-CM

## 2011-02-11 DIAGNOSIS — E669 Obesity, unspecified: Secondary | ICD-10-CM | POA: Diagnosis present

## 2011-02-11 DIAGNOSIS — B029 Zoster without complications: Secondary | ICD-10-CM | POA: Diagnosis present

## 2011-02-11 DIAGNOSIS — I2489 Other forms of acute ischemic heart disease: Secondary | ICD-10-CM | POA: Diagnosis not present

## 2011-02-11 DIAGNOSIS — Y831 Surgical operation with implant of artificial internal device as the cause of abnormal reaction of the patient, or of later complication, without mention of misadventure at the time of the procedure: Secondary | ICD-10-CM | POA: Diagnosis present

## 2011-02-11 DIAGNOSIS — I129 Hypertensive chronic kidney disease with stage 1 through stage 4 chronic kidney disease, or unspecified chronic kidney disease: Secondary | ICD-10-CM | POA: Diagnosis present

## 2011-02-11 DIAGNOSIS — N189 Chronic kidney disease, unspecified: Secondary | ICD-10-CM | POA: Diagnosis present

## 2011-02-11 DIAGNOSIS — M810 Age-related osteoporosis without current pathological fracture: Secondary | ICD-10-CM | POA: Diagnosis present

## 2011-02-11 DIAGNOSIS — B91 Sequelae of poliomyelitis: Secondary | ICD-10-CM

## 2011-02-11 HISTORY — PX: JOINT REPLACEMENT: SHX530

## 2011-02-11 LAB — TYPE AND SCREEN
ABO/RH(D): A POS
Antibody Screen: NEGATIVE

## 2011-02-11 LAB — ABO/RH: ABO/RH(D): A POS

## 2011-02-12 LAB — CBC
MCH: 28.8 pg (ref 26.0–34.0)
Platelets: 185 10*3/uL (ref 150–400)
RBC: 3.58 MIL/uL — ABNORMAL LOW (ref 3.87–5.11)
WBC: 13.6 10*3/uL — ABNORMAL HIGH (ref 4.0–10.5)

## 2011-02-12 LAB — PROTIME-INR
INR: 1.15 (ref 0.00–1.49)
Prothrombin Time: 14.9 seconds (ref 11.6–15.2)

## 2011-02-12 LAB — BASIC METABOLIC PANEL
CO2: 24 mEq/L (ref 19–32)
Calcium: 8.7 mg/dL (ref 8.4–10.5)
Chloride: 100 mEq/L (ref 96–112)
Potassium: 4.8 mEq/L (ref 3.5–5.1)
Sodium: 131 mEq/L — ABNORMAL LOW (ref 135–145)

## 2011-02-13 ENCOUNTER — Inpatient Hospital Stay (HOSPITAL_COMMUNITY): Payer: Medicare Other

## 2011-02-13 DIAGNOSIS — I4891 Unspecified atrial fibrillation: Secondary | ICD-10-CM

## 2011-02-13 LAB — CBC
HCT: 31 % — ABNORMAL LOW (ref 36.0–46.0)
Platelets: 164 10*3/uL (ref 150–400)
RDW: 15.1 % (ref 11.5–15.5)
WBC: 12 10*3/uL — ABNORMAL HIGH (ref 4.0–10.5)

## 2011-02-13 LAB — BASIC METABOLIC PANEL
Chloride: 106 mEq/L (ref 96–112)
Creatinine, Ser: 1.27 mg/dL — ABNORMAL HIGH (ref 0.50–1.10)
GFR calc Af Amer: 46 mL/min — ABNORMAL LOW (ref >90)
Sodium: 138 mEq/L (ref 135–145)

## 2011-02-13 LAB — PROTIME-INR: INR: 1.24 (ref 0.00–1.49)

## 2011-02-14 DIAGNOSIS — I4891 Unspecified atrial fibrillation: Secondary | ICD-10-CM

## 2011-02-14 LAB — BASIC METABOLIC PANEL
BUN: 19 mg/dL (ref 6–23)
Calcium: 8.9 mg/dL (ref 8.4–10.5)
Creatinine, Ser: 1.42 mg/dL — ABNORMAL HIGH (ref 0.50–1.10)
GFR calc non Af Amer: 35 mL/min — ABNORMAL LOW (ref >90)
Glucose, Bld: 119 mg/dL — ABNORMAL HIGH (ref 70–99)

## 2011-02-14 LAB — CARDIAC PANEL(CRET KIN+CKTOT+MB+TROPI)
CK, MB: 3.1 ng/mL (ref 0.3–4.0)
CK, MB: 2.9 ng/mL (ref 0.3–4.0)
Relative Index: INVALID (ref 0.0–2.5)
Total CK: 82 U/L (ref 7–177)
Troponin I: 0.82 ng/mL (ref <0.30)
Troponin I: 1.02 ng/mL (ref <0.30)
Troponin I: 1.43 ng/mL (ref <0.30)
Troponin I: 1.85 ng/mL (ref <0.30)

## 2011-02-14 LAB — MAGNESIUM: Magnesium: 2.1 mg/dL (ref 1.5–2.5)

## 2011-02-14 LAB — TSH: TSH: 0.683 u[IU]/mL (ref 0.350–4.500)

## 2011-02-14 LAB — PROTIME-INR: INR: 1.66 — ABNORMAL HIGH (ref 0.00–1.49)

## 2011-02-14 LAB — PRO B NATRIURETIC PEPTIDE: Pro B Natriuretic peptide (BNP): 5080 pg/mL — ABNORMAL HIGH (ref 0–450)

## 2011-02-14 LAB — CBC
HCT: 30.4 % — ABNORMAL LOW (ref 36.0–46.0)
Hemoglobin: 9.5 g/dL — ABNORMAL LOW (ref 12.0–15.0)
MCH: 28.5 pg (ref 26.0–34.0)
MCHC: 31.3 g/dL (ref 30.0–36.0)

## 2011-02-14 LAB — PHOSPHORUS: Phosphorus: 2.5 mg/dL (ref 2.3–4.6)

## 2011-02-15 LAB — PROTIME-INR: INR: 1.79 — ABNORMAL HIGH (ref 0.00–1.49)

## 2011-02-17 DIAGNOSIS — I4891 Unspecified atrial fibrillation: Secondary | ICD-10-CM

## 2011-02-17 LAB — PROTIME-INR
INR: 1.83 — ABNORMAL HIGH (ref 0.00–1.49)
Prothrombin Time: 21.5 seconds — ABNORMAL HIGH (ref 11.6–15.2)

## 2011-02-18 LAB — CBC
Hemoglobin: 8.8 g/dL — ABNORMAL LOW (ref 12.0–15.0)
MCHC: 31.9 g/dL (ref 30.0–36.0)
RDW: 15.3 % (ref 11.5–15.5)
WBC: 6.5 10*3/uL (ref 4.0–10.5)

## 2011-02-18 LAB — PROTIME-INR
INR: 2.22 — ABNORMAL HIGH (ref 0.00–1.49)
Prothrombin Time: 25 seconds — ABNORMAL HIGH (ref 11.6–15.2)

## 2011-02-20 NOTE — Op Note (Signed)
NAMEMarland Kitchen  Heather Benson, Heather Benson             ACCOUNT NO.:  [id number]  MEDICAL RECORD NO.:  [id number]  LOCATION:  0009                         FACILITY:  [facility]  PHYSICIAN:  Gaynelle Arabian, M.D.    DATE OF BIRTH:  [birth year]  DATE OF PROCEDURE:  02/11/2011 DATE OF DISCHARGE:                              OPERATIVE REPORT   PREOPERATIVE DIAGNOSIS:  Osteoarthritis, right knee.  POSTOPERATIVE DIAGNOSIS:  Osteoarthritis, right knee.  PROCEDURE:  Right total knee arthroplasty.  SURGEON:  Gaynelle Arabian, M.D.  ASSISTANT:  Alexzandrew L. Perkins, P.A.C.  ANESTHESIA:  General.  ESTIMATED BLOOD LOSS:  Minimal.  DRAIN:  Hemovac x1.  TOURNIQUET TIME:  33 minutes at 300 mmHg.  COMPLICATIONS:  None.  CONDITION:  Stable to Recovery.  CLINICAL NOTE:  Heather Benson is a 77 year old female with advanced end- stage arthritis of the right knee with progressively worsening pain and dysfunction.  She has had a previous arthroscopy which had a meniscal tear, but also showed degenerative changes bone-on-bone in the lateral patellofemoral compartments.  She has had injections including cortisone and viscosupplements and analgesics, which have not helped.  She has failed nonoperative management, presents now for right total knee arthroplasty.  PROCEDURE IN DETAIL:  After successful administration of general anesthetic, a tourniquet was placed high on her right thigh and her right lower extremity was prepped and draped in the usual sterile fashion.  The extremity was wrapped in Esmarch, knee flexed, tourniquet inflated to 300 mmHg.  A midline incision was made with a 10 blade through subcutaneous tissue to the level of the extensor mechanism.  A fresh blade was used make a medial parapatellar arthrotomy.  Soft tissue on the proximal medial tibia was subperiosteally elevated to the joint line with the knife into the semimembranosus bursa with a Cobb elevator. Soft tissue laterally was elevated with  attention being paid to avoiding the patellar tendon on the tibial tubercle.  The patella was everted, knee flexed to 90 degrees and ACL and PCL were removed.  She has bone-on- bone change in the lateral compartment and bone-on-bone on the patellofemoral.  A drill was used to create a starting hole in the distal femur and the canal was thoroughly irrigated.  5 degree right valgus alignment guide was placed and the distal femoral cutting block was pinned to remove 10 mm off the distal femur.  Resection was made with an oscillating saw.  The tibia was subluxed forward and the menisci were removed.  The extramedullary tibial alignment guide was placed, referencing proximally at the medial aspect of the tibial tubercle and distally along the second metatarsal axis and tibial crest.  Block was pinned to remove 2 mm off the more deficient lateral side.  Tibial resection was made with an oscillating saw.  Size 2.5 was the most appropriate tibial component. The proximal tibia was prepared to modular drill and keel punch for the size 2.5.  Femoral sizing guide was placed, size 3 was most appropriate. Rotation was marked off the epicondylar axis and confirmed by creating a rectangular flexion gap at 90 degrees.  The block was pinned in that rotation, then the anterior, posterior, and chamfer cuts were made. Intercondylar block was placed  and that cut was made.  Trial size 3 posterior stabilized femur was placed.  A 12.5 mm posterior stabilized rotating platform insert trial was placed.  Full extension was achieved with excellent varus-valgus and anterior-posterior balance throughout full range of motion.  The patella was everted and thickness measured to be 22 mm.  Freehand resection was taken to 12 mm, 35 template was placed, lug holes were drilled, trial patella was placed and it tracked normally.  Osteophytes were removed off the posterior femur with the trial in place.  All trials were removed  and the cut bone surfaces were prepared with pulsatile lavage.  Cement was mixed and once ready for implantation, the size 2.5 mobile-bearing tibial tray, size 3 posterior stabilized femur, and 35 patella were cemented into place and the patella was held with a clamp.  All extruded cement was removed.  The trial 12.5-mm insert was placed, knee held in full extension. Additional extruded cement was removed.  When the cement was fully hardened, then the permanent 12.5-mm posterior stabilized rotating platform insert was placed in the tibial tray.  The wound was copiously irrigated with saline solution and the arthrotomy closed over Hemovac drain with interrupted #1 PDS, flexion against gravity to 135 degrees and patella tracked normally.  The tourniquet was released at a total time of 33 minutes.  Subcu was closed with interrupted 2-0 Vicryl and subcuticular running 4-0 Monocryl.  The incision was cleaned and dried, and Steri-Strips and a bulky sterile dressing were applied.  She was then placed into a knee immobilizer, awakened, transferred to recovery in stable condition.  Please note that it was a medical necessity to have a surgical assistant for this procedure to perform it in a safe and expeditious manner. Surgical assistance was necessary to retract ligaments and vital neurovascular structures to prevent them from injury.  Surgical assistance was also necessary for proper positioning of the leg to allow for anatomic placement of prosthetic implants.     Gaynelle Arabian, M.D.     FA/MEDQ  D:  02/11/2011  T:  02/11/2011  Job:  [id number]  Electronically Signed by Gaynelle Arabian M.D. on 02/20/2011 11:22:59 AM

## 2011-02-27 NOTE — Consult Note (Signed)
NAMEMarland Kitchen  ROHNDA, Heather Benson NO.:  [id number]  MEDICAL RECORD NO.:  [id number]  LOCATION:  24                         FACILITY[facility]  PHYSICIAN:  Lovenia Shuck, MD   DATE OF BIRTH:  03/06/1932  DATE OF CONSULTATION: DATE OF DISCHARGE:                                CONSULTATION   REFERRING PHYSICIAN:  Gaynelle Arabian, M.D.  REASON FOR CONSULTATION:  AFib.  CHIEF COMPLAINT:  Palpitations.  HISTORY OF PRESENT ILLNESS:  Heather Benson is a pleasant white female with history of noncardiac chest pain status post cath showing nonobstructive coronary disease in January 2012, who is in the hospital now, 2 days status post a total knee replacement.  Tonight on the floor she developed AFib with rapid ventricular response.  Rapid response moved her to the ICU and I was consulted.  The patient is relatively asymptomatic at this time, complains only of a fluttering feeling in her chest.  She has no chest pain or shortness of breath.  Other than pain in her knee, she overall is doing fairly well.  She has no orthopnea or PND.  She states that she has never had any atrial fibrillation before.  PAST MEDICAL HISTORY: 1. Knee osteoarthritis. 2. Irritable bowel syndrome. 3. Obesity. 4. History of a renal cyst. 5. History of melanoma. 6. Polio. 7. Gastroesophageal reflux disease. 8. Osteoporosis. 9. Chest pain with nonobstructive coronary disease by left heart cath,     January 2012. 10.Partial hysterectomy. 11.Arthroscopic knee surgery.  ALLERGIES:  CODEINE.  MEDICATIONS: 1. Multivitamin daily. 2. Allegra daily. 3. Aspirin 81 mg daily. 4. Vitamin D3 daily. 5. Losartan/HCTZ 100/12.5 daily. 6. Toprol XL 25 mg daily. 7. Omeprazole 20 mg daily.  SOCIAL HISTORY:  The patient lives at home with her husband.  She has never smoked cigarettes.  She does not drink alcohol.  FAMILY HISTORY:  There is a history of early coronary disease.  There is no family history of  atrial fibrillation.  REVIEW OF SYSTEMS:  Full review of systems is obtained and is negative except as stated in HPI.  She does have knee pain.  PHYSICAL EXAMINATION:  VITAL SIGNS:  Blood pressure 107/70, heart rate 155 initially, respirations 20, temperature afebrile. GENERAL:  No acute stress. HEENT:  Extraocular movements intact.  Oropharynx benign.  Nonicteric sclerae. NECK:  Supple. CARDIOVASCULAR:  Irregular, tachycardic.  No obvious gallops or murmurs and a elevated heart rate.  Jugular venous pressure is currently elevated while she is in AFib. LUNGS:  Clear to auscultation bilaterally. ABDOMEN:  Soft, nontender, nondistended. EXTREMITIES:  Mild edema in her right lower extremity and right knee. Incision clean. SKIN:  No rashes. LYMPH:  No lymphadenopathy. NEURO:  Afocal.  Moves all extremities well.  Hemoglobin 10, white count 12, BUN 23, creatinine 1.27, INR 1.24.  EKG shows a AFib with RVR.  No ST-T changes.  ASSESSMENT/PLAN:  Heather Benson is a 77 year old white female who is postop day #2 from total knee replacement who developed atrial fibrillation with rapid ventricular rate on the floor.  We will treat her with IV diltiazem 50 mg x1 in the IV drip.  Continue her on telemetry monitoring in the step-down unit here.  She will need a TSH with her next lab draw and a 2-D echocardiogram.  We will also plan on cycling her cardiac markers.  Chest x-ray has been ordered and she will get a repeat EKG in the morning.  She is currently asymptomatic. If she develops hypoxia, given her recent surgery, pulmonary embolism should be considered.  If she does not spontaneously convert to normal sinus rhythm, we will likely plan to cardiovert in the next 24 to 48 hours.     Lovenia Shuck, MD     MMB/MEDQ  D:  02/13/2011  T:  02/14/2011  Job:  [id number]  Electronically Signed by Aletta Edouard MD on 02/27/2011 11:55:05 PM

## 2011-03-06 NOTE — Discharge Summary (Signed)
NAMEMarland Kitchen  ANAMARIS, SARANTOS             ACCOUNT NO.:  [id number]  MEDICAL RECORD NO.:  [id number]  LOCATION:  [id number]                         [facility]ACILITY:  [facility]  PHYSICIAN:  Gaynelle Arabian, M.D.    DATE OF BIRTH:  May 13, 1932  DATE OF ADMISSION:  02/11/2011 DATE OF DISCHARGE:  02/18/2011                              DISCHARGE SUMMARY   ADMITTING DIAGNOSES: 1. Osteoarthritis, right knee. 2. Polio, age 77. 22. Shingles. 4. 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 5. Hypertension. 6. Reflux disease.  DISCHARGE DIAGNOSES: 1. Osteoarthritis, right knee; status post right total knee     replacement arthroplasty. 2. Postoperative acute blood loss anemia. 3. Postoperative hyponatremia, improved. 4. Postoperative atrial fibrillation with rapid ventricular rate.  PROCEDURE:  February 11, 2011, right total knee.  SURGEON:  Gaynelle Arabian, MD  ASSISTANT:  Alexzandrew L. Dara Lords, PA-C  ANESTHESIA:  General.  CONSULTS: [facility]. 2. Critical Care Medicine.  BRIEF HISTORY:  The patient is a 77 year old female with advanced arthritis of the right knee, progressive worsening pain and dysfunction, previous scope and meniscal tear surgery showed some bone-on-bone changes, lateral patellofemoral compartment.  She has had injections and failed cortisone injection, viscosupplementation and also analgesics; now presents for total knee arthroplasty.  LABORATORY DATA:  On admission, the preop CBC is not scanned in the chart, but the postop hemoglobin was 10.6, drifted down to 9.5.  Last noted hemoglobin and hematocrit were 8.8 and 27.6.  The admission PT/INR not scanned in, but serial pro-times followed per Coumadin protocol. Last noted pro-time/INR was actually 25.0 and 2.2.  Chem panel on admission not scanned in the chart, but postop sodium dropped down to 131, was back up to 135.  She had elevated BUN and creatinine noted to her known renal insufficiency.  Those  did improve postoperatively and back down to the 19 and 1.42.  She had a phosphorous level taken on June 16, 2010, which is 2.5.  Cardiac enzymes taken on October 3, first set CK 88, CK-MB 3.5.  Troponin 1.85.  B-type natriuretic peptide taken at that time was also 5080.  Second set of cardiac enzymes taken on February 14, 2011; CK 82, CK-MB 3.1.  Troponin 1.43.  Third set taken on February 14, 2011; CK 87, CK-MB 3.1.  Troponin 1.02.  Last set taken on February 14, 2011; CK 81, CK-MB 2.9.  Troponin 0.82.  TSH taken 0.683. Blood group type A positive.  X-rays, portable chest taken on February 13, 2011, shallow inspiration, no evidence of acute pulmonary disease.  HOSPITAL COURSE:  The patient was admitted to [facility], taken to OR, underwent above-stated procedure without complication.  The patient tolerated well, later changed from recovery room to orthopedic floor.  Started on p.o. and IV analgesics, given 24-hour postop IV antibiotics.  Actually doing pretty well on the morning of day 1, seen in rounds by Dr. Wynelle Link, had decent output.  Sodium was a little low. She had some known renal insufficiency and we decreased the fluids, started back on her home meds.  By day 2, she was actually doing pretty well.  Hemoglobin was 10.2.  We changed the dressing.  Incision looked good.  There was possibility that  she would be going home in the next day or so.  So we got discharge planning.  She was starting to get up and ambulate with therapy, fairly well.  Unfortunately, later that evening, she had gone into a rapid heart rate.  EKG was performed by rapid response team, showed to be in atrial fibrillation.  She was transferred.  Cardiology was called.  She was seen and found to have a rapid response.  TSH was drawn.  Chest x-ray was taken, which was stated as above.  She was transferred to step-down for further care.  Critical Care Medicine also evaluated her while she was in step-down.   Enzymes and troponins were taken, and she was monitored and treated by Cardiology and [place].  She remained in the unit a couple of days.  By day 3, she was already back in sinus rhythm for a couple of hours and went back into atrial fibrillation, she was in and out.  Her enzymes were taken and the first 2 sets looked okay, but she had 2 further sets taken for a total of 4 as above.  She had been started on IV Cardizem which helped with the rate.  She was given fluids for any kind of orthostatic hypotension.  Her troponins which were mildly elevated on the enzymes were felt to be due to the rapid response and the ischemic demand.  It was doubtful that she had a PE.  It did not feel she had any acute coronary syndrome.  It felt like it was more of an ischemic demand and troponin leak.  By day 4, she was doing a little better.  She was getting up to the bathroom.  We are waiting for her to get fully cleared before we resume her to aggressive PT.  She was placed on Coumadin postoperatively.  By this time, her INR was 1.79.  This was noted be on day 4.  She remained on IV and eventually was switched over to p.o. meds once she was back in sinus rhythm with a heart rate back down to normal limits diet.  By day 5, she was doing better.  Heart rate was controlled.  She was transferred out of the unit and up to telemetry floor where she remained over the weekend, has been on Saturday and Sunday.  She continued to receive therapy once that was resumed.  She was in sinus rhythm.  We felt that it was all related to her surgical stress.  She was started on amiodarone after came off the IV meds.  She went into the telemetry floor over the weekend again and received therapy on Sunday and by Monday, she was seen back in rounds by Dr. Wynelle Link.  Her atrial fibrillation had resolved.  She was doing better. She was rate controlled.  We had her work with therapy and when she proved that she  can meet her goals, she was doing well, it was decided she be discharged home at that time.  DISCHARGE PLAN: 1. The patient discharged home on February 18, 2011. 2. Discharge diagnoses, please see above. 3. Discharge meds:  Amiodarone, hydromorphone, Nu-Iron, Robaxin,     Coumadin.  Continue home meds of Allegra, baby aspirin,     losartan/hydrochlorothiazide, metoprolol, and omeprazole. 4. Diet:  Heart-healthy diet. 5. Activity:  She is weightbearing as tolerated.  Total knee protocol. 6. Follow up in 2 weeks.  DISPOSITION:  Home.  CONDITION ON DISCHARGE:  Improving.     Alexzandrew  Monika Salk, P.A.C.   ______________________________ Gaynelle Arabian, M.D.    ALP/MEDQ  D:  03/03/2011  T:  03/03/2011  Job:  [id number]  cc:   Emeline General. Dema Severin, M.D. [phone]   Cardiology  Dr. Harrietta Guardian, MD Fax: [phone]  Electronically Signed by Mickel Crow P.A.C. on 03/04/2011 [id number] PM Electronically Signed by Gaynelle Arabian M.D. on 03/06/2011 11:24:17 AM

## 2011-03-08 NOTE — H&P (Signed)
NAMEMarland Kitchen  Heather Benson, Heather Benson             ACCOUNT NO.:  [id number]  MEDICAL RECORD NO.:  [id number]  LOCATION:  [id number]                         [facility]ACILITY[facility]  [facility]  PHYSICIAN:  Alexzandrew L. Perkins, P.A.C.DATE OF BIRTH:  May 11, 1932  DATE OF ADMISSION:  02/11/2011 DATE OF DISCHARGE:                             HISTORY & PHYSICAL   CHIEF COMPLAINT:  Right knee pain.  HISTORY OF PRESENT ILLNESS:  The patient is a 77 year old female who has been seen by Dr. Wynelle Link for ongoing right knee pain.  She has been treated conservatively in the past for her arthritis.  She has undergone injections and also medications.  She has now reached a point where she has advanced arthritis and near bone-on-bone, moderate amount of arthritis in the right knee, and she does have some small areas of bone- on-bone changes.  It was felt she would benefit undergoing surgical intervention.  Risks and benefits have been discussed.  She elects to proceed with surgery.  ALLERGIES:  No known drug allergies.  CURRENT MEDICATIONS: 1. Metoprolol ER 25 mg daily. 2. Baby aspirin 81 mg daily. 3. Vitamin D3 2,000 international units daily. 4. Fexofenadine 180 mg daily. 5. Vicodin 5 mg p.r.n. pain. 6. Omeprazole 20 mg daily. 7. Robaxin 500 mg t.i.d. p.r.n. spasm. 8. Losartan/HCTZ 100/12.5 daily. 9. Advil p.r.n.  PAST MEDICAL HISTORY: 1. Polio age 60 with some left-sided residual weakness. 2. Shingles. 3. Nonsteroidal anti-inflammatory induced renal insufficiency. 4. Hypertension. 5. Reflux disease.  PAST SURGICAL HISTORY: 1. Foot surgery. 2. Hysterectomy.  FAMILY HISTORY:  Father deceased at 58 of heart disease, kidney disease. Mother deceased at age 63 with congestive heart failure, colon cancer.  SOCIAL HISTORY:  She is married.  Denies use of tobacco products or alcohol products.  REVIEW OF SYSTEMS:  GENERAL:  No fevers, chills, night sweats.  NEURO: No seizures, syncope, or paralysis.  RESPIRATORY: No  shortness breath, productive cough, or hemoptysis.  CARDIOVASCULAR:  No chest pain, angina, orthopnea.  GI: No nausea, vomiting, diarrhea, or constipation. GU: No dysuria, hematuria, or discharge.  MUSCULOSKELETAL:  Knee pain.  PHYSICAL EXAMINATION:  VITAL SIGNS: Pulse 74, respirations 14, blood pressure 132/70. GENERAL: A 77 year old white female well-nourished, well-developed, no acute distress, slightly overweight, alert, oriented and cooperative, pleasant. HEENT: Normocephalic, atraumatic.  Pupils round and reactive.  EOMs intact.  Does have upper dentures. NECK: Supple.  No bruits. CHEST: Clear. HEART: Regular rate and rhythm without murmur, S1 and S2 noted. ABDOMEN: Soft, round.  Bowel sounds present. RECTAL:  Not done, not pertinent to present illness. BREASTS:  Not done, not pertinent to present illness. GENITALIA:  Not done, not pertinent to present illness. EXTREMITIES:  Right knee, no effusion.  Marked crepitus, tender more medial than lateral.  No instability.  Left knee, no effusion.  Range of motion 0 to 135.  No tenderness, no instability.  IMPRESSION:  Osteoarthritis, right knee.  PLAN:  The patient will be admitted to [facility] to undergo right total knee arthroplasty.  Surgery will be performed by Dr. Gaynelle Arabian.     Alexzandrew L. Perkins, P.A.C.     ALP/MEDQ  D:  02/13/2011  T:  02/13/2011  Job:  [id number]  cc:  Emeline General Dema Severin, M.D. Fax: [phone]  Electronically Signed by Mickel Crow P.A.C. on 02/14/2011 10:59:21 AM Electronically Signed by Gaynelle Arabian M.D. on 03/08/2011 02:54:39 PM

## 2011-05-20 DIAGNOSIS — L570 Actinic keratosis: Secondary | ICD-10-CM | POA: Diagnosis not present

## 2011-05-20 DIAGNOSIS — L0100 Impetigo, unspecified: Secondary | ICD-10-CM | POA: Diagnosis not present

## 2011-06-27 DIAGNOSIS — E785 Hyperlipidemia, unspecified: Secondary | ICD-10-CM | POA: Diagnosis not present

## 2011-06-27 DIAGNOSIS — I129 Hypertensive chronic kidney disease with stage 1 through stage 4 chronic kidney disease, or unspecified chronic kidney disease: Secondary | ICD-10-CM | POA: Diagnosis not present

## 2011-06-27 DIAGNOSIS — D1800 Hemangioma unspecified site: Secondary | ICD-10-CM | POA: Diagnosis not present

## 2011-06-27 DIAGNOSIS — E559 Vitamin D deficiency, unspecified: Secondary | ICD-10-CM | POA: Diagnosis not present

## 2011-06-27 DIAGNOSIS — K219 Gastro-esophageal reflux disease without esophagitis: Secondary | ICD-10-CM | POA: Diagnosis not present

## 2011-06-27 DIAGNOSIS — Z1331 Encounter for screening for depression: Secondary | ICD-10-CM | POA: Diagnosis not present

## 2011-07-02 DIAGNOSIS — M25569 Pain in unspecified knee: Secondary | ICD-10-CM | POA: Diagnosis not present

## 2011-07-10 DIAGNOSIS — M25569 Pain in unspecified knee: Secondary | ICD-10-CM | POA: Diagnosis not present

## 2011-07-12 DIAGNOSIS — M25569 Pain in unspecified knee: Secondary | ICD-10-CM | POA: Diagnosis not present

## 2011-07-15 DIAGNOSIS — M25569 Pain in unspecified knee: Secondary | ICD-10-CM | POA: Diagnosis not present

## 2011-07-18 DIAGNOSIS — M25569 Pain in unspecified knee: Secondary | ICD-10-CM | POA: Diagnosis not present

## 2011-07-23 DIAGNOSIS — M25569 Pain in unspecified knee: Secondary | ICD-10-CM | POA: Diagnosis not present

## 2011-07-30 DIAGNOSIS — M25569 Pain in unspecified knee: Secondary | ICD-10-CM | POA: Diagnosis not present

## 2011-08-08 DIAGNOSIS — M1710 Unilateral primary osteoarthritis, unspecified knee: Secondary | ICD-10-CM | POA: Diagnosis not present

## 2011-08-25 ENCOUNTER — Other Ambulatory Visit: Payer: Self-pay | Admitting: Orthopedic Surgery

## 2011-08-26 DIAGNOSIS — L821 Other seborrheic keratosis: Secondary | ICD-10-CM | POA: Diagnosis not present

## 2011-08-26 DIAGNOSIS — L570 Actinic keratosis: Secondary | ICD-10-CM | POA: Diagnosis not present

## 2011-08-26 DIAGNOSIS — L905 Scar conditions and fibrosis of skin: Secondary | ICD-10-CM | POA: Diagnosis not present

## 2011-08-26 DIAGNOSIS — Z85828 Personal history of other malignant neoplasm of skin: Secondary | ICD-10-CM | POA: Diagnosis not present

## 2011-08-26 DIAGNOSIS — D0439 Carcinoma in situ of skin of other parts of face: Secondary | ICD-10-CM | POA: Diagnosis not present

## 2011-08-26 DIAGNOSIS — C44320 Squamous cell carcinoma of skin of unspecified parts of face: Secondary | ICD-10-CM | POA: Diagnosis not present

## 2011-08-26 DIAGNOSIS — Z85820 Personal history of malignant melanoma of skin: Secondary | ICD-10-CM | POA: Diagnosis not present

## 2011-08-28 ENCOUNTER — Encounter (HOSPITAL_COMMUNITY): Payer: Self-pay

## 2011-08-28 ENCOUNTER — Encounter (HOSPITAL_COMMUNITY)
Admission: RE | Admit: 2011-08-28 | Discharge: 2011-08-28 | Disposition: A | Discharge status: Home / Self Care | Payer: Medicare Other | Source: Ambulatory Visit | Attending: Orthopedic Surgery | Admitting: Orthopedic Surgery

## 2011-08-28 DIAGNOSIS — T8859XA Other complications of anesthesia, initial encounter: Secondary | ICD-10-CM

## 2011-08-28 DIAGNOSIS — K219 Gastro-esophageal reflux disease without esophagitis: Secondary | ICD-10-CM

## 2011-08-28 DIAGNOSIS — M6580 Other synovitis and tenosynovitis, unspecified site: Secondary | ICD-10-CM | POA: Diagnosis not present

## 2011-08-28 DIAGNOSIS — R29898 Other symptoms and signs involving the musculoskeletal system: Secondary | ICD-10-CM | POA: Diagnosis not present

## 2011-08-28 DIAGNOSIS — M1990 Unspecified osteoarthritis, unspecified site: Secondary | ICD-10-CM

## 2011-08-28 DIAGNOSIS — I10 Essential (primary) hypertension: Secondary | ICD-10-CM | POA: Diagnosis not present

## 2011-08-28 DIAGNOSIS — G709 Myoneural disorder, unspecified: Secondary | ICD-10-CM

## 2011-08-28 DIAGNOSIS — Z01812 Encounter for preprocedural laboratory examination: Secondary | ICD-10-CM | POA: Diagnosis not present

## 2011-08-28 DIAGNOSIS — M7989 Other specified soft tissue disorders: Secondary | ICD-10-CM

## 2011-08-28 DIAGNOSIS — Z79899 Other long term (current) drug therapy: Secondary | ICD-10-CM | POA: Diagnosis not present

## 2011-08-28 DIAGNOSIS — C801 Malignant (primary) neoplasm, unspecified: Secondary | ICD-10-CM

## 2011-08-28 DIAGNOSIS — Z85820 Personal history of malignant melanoma of skin: Secondary | ICD-10-CM | POA: Diagnosis not present

## 2011-08-28 HISTORY — DX: Gastro-esophageal reflux disease without esophagitis: K21.9

## 2011-08-28 HISTORY — DX: Malignant (primary) neoplasm, unspecified: C80.1

## 2011-08-28 HISTORY — PX: ABDOMINAL HYSTERECTOMY: SHX81

## 2011-08-28 HISTORY — DX: Other specified soft tissue disorders: M79.89

## 2011-08-28 HISTORY — PX: CATARACT EXTRACTION, BILATERAL: SHX1313

## 2011-08-28 HISTORY — DX: Myoneural disorder, unspecified: G70.9

## 2011-08-28 HISTORY — DX: Unspecified osteoarthritis, unspecified site: M19.90

## 2011-08-28 HISTORY — DX: Adverse effect of unspecified anesthetic, initial encounter: T41.45XA

## 2011-08-28 HISTORY — PX: BUNIONECTOMY: SHX129

## 2011-08-28 HISTORY — DX: Essential (primary) hypertension: I10

## 2011-08-28 HISTORY — DX: Other complications of anesthesia, initial encounter: T88.59XA

## 2011-08-28 LAB — CBC
MCH: 27.6 pg (ref 26.0–34.0)
MCV: 89 fL (ref 78.0–100.0)
Platelets: 228 10*3/uL (ref 150–400)
RDW: 15.6 % — ABNORMAL HIGH (ref 11.5–15.5)
WBC: 7.2 10*3/uL (ref 4.0–10.5)

## 2011-08-28 LAB — BASIC METABOLIC PANEL
Calcium: 9.6 mg/dL (ref 8.4–10.5)
Creatinine, Ser: 1.52 mg/dL — ABNORMAL HIGH (ref 0.50–1.10)
GFR calc Af Amer: 37 mL/min — ABNORMAL LOW (ref >90)
GFR calc non Af Amer: 32 mL/min — ABNORMAL LOW (ref >90)

## 2011-08-28 LAB — SURGICAL PCR SCREEN: MRSA, PCR: NEGATIVE

## 2011-08-28 MED ORDER — CHLORHEXIDINE GLUCONATE 4 % EX LIQD: 60.0000 mL | Freq: Once | CUTANEOUS | Status: DC

## 2011-08-28 NOTE — Patient Instructions (Signed)
[place]  08/28/2011   Your procedure is scheduled on:  09-06-2011  Report to [place] at  1245 PM.  Call this number if you have problems the morning of surgery: [phone]   Remember:   Do not eat food:After Midnight.  May have clear liquids:up to 6 Hours before arrival. Nothing after :nothing after 0900 am  Clear liquids include soda, tea, black coffee, apple or grape juice, broth.  Take these medicines the morning of surgery with A SIP OF WATER: Allegra, Metoprolol, Omeprazole, Tylenol   Do not wear jewelry, make-up or nail polish.  Do not wear lotions, powders, or perfumes. You may wear deodorant.  Do not shave 48 hours prior to surgery.(face and neck okay, no shaving of legs)  Do not bring valuables to the hospital.  Contacts, dentures or bridgework may not be worn into surgery.  Leave suitcase in the car. After surgery it may be brought to your room.  For patients admitted to the hospital, checkout time is 11:00 AM the day of discharge.   Patients discharged the day of surgery will not be allowed to drive home.  Name and phone number of your driver: spouse  Special Instructions: CHG Shower Use Special Wash: 1/2 bottle night before surgery and 1/2 bottle morning of surgery.(avoid face and genitals)   Please read over the following fact sheets that you were given: MRSA Information, Incentive Spirometry Instruction.

## 2011-08-28 NOTE — Pre-Procedure Instructions (Addendum)
08-28-11 EKG 10'12 in Epic/ CXR 9'12 report in Epic.W. Luba Matzen,RN Fascimil to Dr. Wynelle Link -note labs viewable in Epic-abnormal Bun.W. Rosena Bartle,RN

## 2011-08-30 DIAGNOSIS — H40019 Open angle with borderline findings, low risk, unspecified eye: Secondary | ICD-10-CM | POA: Diagnosis not present

## 2011-09-02 DIAGNOSIS — I4891 Unspecified atrial fibrillation: Secondary | ICD-10-CM | POA: Diagnosis not present

## 2011-09-05 DIAGNOSIS — C44320 Squamous cell carcinoma of skin of unspecified parts of face: Secondary | ICD-10-CM | POA: Diagnosis not present

## 2011-09-05 NOTE — H&P (Signed)
  CC- Heather Benson is a 78 y.o. female who presents with right knee pain.  HPI- . Knee Pain: Patient presents with knee pain involving the  right knee. Onset of the symptoms was several months ago. Inciting event: none known. Current symptoms include crepitus sensation. Pain is aggravated by going up and down stairs, lateral movements, rising after sitting and squatting.  Patient has had prior knee problems. Evaluation to date: plain films: abnormal knee replacement in excellent position with no abnormalities. Treatment to date: avoidance of offending activity.  Past Medical History  Diagnosis Date  . Complication of anesthesia 08-28-11    in past arrythmia-  cardiology has seen in past  . Hypertension 08-28-11    tx. meds  . Swelling of extremity 08-28-11    bilateral lower extremities- mid calf down  . GERD (gastroesophageal reflux disease) 08-28-11    tx. omeprazole  . Cancer 08-28-11    Melonoma-cheek(many Yrs ago) , recent bx. left  face lesion, past skin cancer lesions  . Arthritis 08-28-11    right knee pain-surgery planned  . Neuromuscular disorder 08-28-11    hx. Polio age 24-slight residual affects legs  . Dysrhythmia 08-28-11    only immed.  to several days after surgery- hx. A.fib. in past    Past Surgical History  Procedure Date  . Cataract extraction, bilateral 08-28-11    bilateral  . Abdominal hysterectomy 08-28-11    Partial-vaginal approach  . Bunionectomy 08-28-11    right    Prior to Admission medications   Medication Sig Start Date End Date Taking? Authorizing Provider  aspirin 325 MG tablet Take 325 mg by mouth every morning.   Yes Historical Provider, MD  cholecalciferol (VITAMIN D) 1000 UNITS tablet Take 1,000 Units by mouth daily.   Yes Historical Provider, MD  fexofenadine (ALLEGRA) 180 MG tablet Take 180 mg by mouth daily.   Yes Historical Provider, MD  losartan-hydrochlorothiazide (HYZAAR) 100-12.5 MG per tablet Take 1 tablet by mouth daily.   Yes  Historical Provider, MD  metoprolol succinate (TOPROL-XL) 25 MG 24 hr tablet Take 25 mg by mouth every morning.   Yes Historical Provider, MD  omeprazole (PRILOSEC) 20 MG capsule Take 20 mg by mouth daily.   Yes Historical Provider, MD  tetrahydrozoline 0.05 % ophthalmic solution Place 1 drop into both eyes 2 (two) times daily.   Yes Historical Provider, MD    KNEE EXAM  No effusion. Range 0-120 with moderate crepitus on ROM. No tenderness or instability  Physical Examination: General appearance - alert, well appearing, and in no distress Mental status - alert, oriented to person, place, and time Chest - clear to auscultation, no wheezes, rales or rhonchi, symmetric air entry Heart - normal rate, regular rhythm, normal S1, S2, no murmurs, rubs, clicks or gallops Abdomen - soft, nontender, nondistended, no masses or organomegaly Neurological - alert, oriented, normal speech, no focal findings or movement disorder noted   Asessment/Plan--- Right knee hypertrophic synovitis- - Plan rightt knee arthroscopy with synovectomy. Procedure risks and potential comps discussed with patient who elects to proceed. Goals are decreased pain and increased function with a high likelihood of achieving both

## 2011-09-06 ENCOUNTER — Ambulatory Visit (HOSPITAL_COMMUNITY)
Admission: RE | Admit: 2011-09-06 | Discharge: 2011-09-06 | Disposition: A | Discharge status: Home / Self Care | Payer: Medicare Other | Source: Ambulatory Visit | Attending: Orthopedic Surgery | Admitting: Orthopedic Surgery

## 2011-09-06 ENCOUNTER — Encounter (HOSPITAL_COMMUNITY)
Admission: RE | Disposition: A | Discharge status: Home / Self Care | Payer: Self-pay | Source: Ambulatory Visit | Attending: Orthopedic Surgery

## 2011-09-06 ENCOUNTER — Encounter (HOSPITAL_COMMUNITY): Payer: Self-pay | Admitting: *Deleted

## 2011-09-06 ENCOUNTER — Ambulatory Visit (HOSPITAL_COMMUNITY): Payer: Medicare Other | Admitting: Anesthesiology

## 2011-09-06 ENCOUNTER — Encounter (HOSPITAL_COMMUNITY): Payer: Self-pay | Admitting: Anesthesiology

## 2011-09-06 DIAGNOSIS — M12269 Villonodular synovitis (pigmented), unspecified knee: Secondary | ICD-10-CM | POA: Diagnosis not present

## 2011-09-06 DIAGNOSIS — Z79899 Other long term (current) drug therapy: Secondary | ICD-10-CM | POA: Insufficient documentation

## 2011-09-06 DIAGNOSIS — M6580 Other synovitis and tenosynovitis, unspecified site: Secondary | ICD-10-CM | POA: Diagnosis not present

## 2011-09-06 DIAGNOSIS — R29898 Other symptoms and signs involving the musculoskeletal system: Secondary | ICD-10-CM | POA: Insufficient documentation

## 2011-09-06 DIAGNOSIS — M659 Synovitis and tenosynovitis, unspecified: Secondary | ICD-10-CM

## 2011-09-06 DIAGNOSIS — I10 Essential (primary) hypertension: Secondary | ICD-10-CM | POA: Diagnosis not present

## 2011-09-06 DIAGNOSIS — Z85820 Personal history of malignant melanoma of skin: Secondary | ICD-10-CM | POA: Diagnosis not present

## 2011-09-06 DIAGNOSIS — Z01812 Encounter for preprocedural laboratory examination: Secondary | ICD-10-CM | POA: Insufficient documentation

## 2011-09-06 DIAGNOSIS — K219 Gastro-esophageal reflux disease without esophagitis: Secondary | ICD-10-CM | POA: Insufficient documentation

## 2011-09-06 HISTORY — PX: KNEE ARTHROSCOPY: SHX127

## 2011-09-06 SURGERY — ARTHROSCOPY, KNEE
Anesthesia: General | Site: Knee | Laterality: Right | Wound class: Clean

## 2011-09-06 MED ORDER — METHOCARBAMOL 500 MG PO TABS
ORAL_TABLET | ORAL | Status: AC
  Filled 2011-09-06: qty 1

## 2011-09-06 MED ORDER — BUPIVACAINE-EPINEPHRINE 0.25% -1:200000 IJ SOLN
INTRAMUSCULAR | Status: DC | PRN
  Administered 2011-09-06: 20 mL

## 2011-09-06 MED ORDER — LACTATED RINGERS IV SOLN
INTRAVENOUS | Status: DC
  Administered 2011-09-06: 1000 mL via INTRAVENOUS

## 2011-09-06 MED ORDER — MIDAZOLAM HCL 5 MG/5ML IJ SOLN
INTRAMUSCULAR | Status: DC | PRN
  Administered 2011-09-06: 1 mg via INTRAVENOUS

## 2011-09-06 MED ORDER — FENTANYL CITRATE 0.05 MG/ML IJ SOLN
INTRAMUSCULAR | Status: DC | PRN
  Administered 2011-09-06 (×2): 50 ug via INTRAVENOUS

## 2011-09-06 MED ORDER — ACETAMINOPHEN 10 MG/ML IV SOLN
INTRAVENOUS | Status: DC | PRN
  Administered 2011-09-06: 1000 mg via INTRAVENOUS

## 2011-09-06 MED ORDER — CEFAZOLIN SODIUM-DEXTROSE 2-3 GM-% IV SOLR
INTRAVENOUS | Status: AC
  Filled 2011-09-06: qty 50

## 2011-09-06 MED ORDER — CEFAZOLIN SODIUM-DEXTROSE 2-3 GM-% IV SOLR
2.0000 g | INTRAVENOUS | Status: AC
  Administered 2011-09-06: 2 g via INTRAVENOUS

## 2011-09-06 MED ORDER — SODIUM CHLORIDE 0.9 % IV SOLN: INTRAVENOUS | Status: DC

## 2011-09-06 MED ORDER — METHOCARBAMOL 500 MG PO TABS
500.0000 mg | ORAL_TABLET | Freq: Once | ORAL | Status: AC
  Administered 2011-09-06: 500 mg via ORAL

## 2011-09-06 MED ORDER — FENTANYL CITRATE 0.05 MG/ML IJ SOLN: 25.0000 ug | INTRAMUSCULAR | Status: DC | PRN

## 2011-09-06 MED ORDER — METHOCARBAMOL 500 MG PO TABS: 500.0000 mg | ORAL_TABLET | Freq: Four times a day (QID) | ORAL | Status: AC

## 2011-09-06 MED ORDER — PROPOFOL 10 MG/ML IV EMUL
INTRAVENOUS | Status: DC | PRN
  Administered 2011-09-06: 150 mg via INTRAVENOUS

## 2011-09-06 MED ORDER — ACETAMINOPHEN 10 MG/ML IV SOLN
INTRAVENOUS | Status: AC
  Filled 2011-09-06: qty 100

## 2011-09-06 MED ORDER — HYDROMORPHONE HCL 2 MG PO TABS: 2.0000 mg | ORAL_TABLET | ORAL | Status: AC | PRN

## 2011-09-06 MED ORDER — LIDOCAINE HCL 1 % IJ SOLN
INTRAMUSCULAR | Status: AC
  Filled 2011-09-06: qty 40

## 2011-09-06 MED ORDER — DEXAMETHASONE SODIUM PHOSPHATE 10 MG/ML IJ SOLN: 10.0000 mg | Freq: Once | INTRAMUSCULAR | Status: DC

## 2011-09-06 MED ORDER — ACETAMINOPHEN 10 MG/ML IV SOLN
1000.0000 mg | Freq: Once | INTRAVENOUS | Status: DC
  Filled 2011-09-06: qty 100

## 2011-09-06 MED ORDER — ONDANSETRON HCL 4 MG/2ML IJ SOLN
INTRAMUSCULAR | Status: DC | PRN
  Administered 2011-09-06: 4 mg via INTRAVENOUS

## 2011-09-06 MED ORDER — BUPIVACAINE-EPINEPHRINE PF 0.25-1:200000 % IJ SOLN
INTRAMUSCULAR | Status: AC
  Filled 2011-09-06: qty 30

## 2011-09-06 MED ORDER — LIDOCAINE HCL (CARDIAC) 20 MG/ML IV SOLN
INTRAVENOUS | Status: DC | PRN
  Administered 2011-09-06: 75 mg via INTRAVENOUS

## 2011-09-06 MED ORDER — DEXAMETHASONE SODIUM PHOSPHATE 4 MG/ML IJ SOLN
INTRAMUSCULAR | Status: DC | PRN
  Administered 2011-09-06: 10 mg via INTRAVENOUS

## 2011-09-06 MED ORDER — LACTATED RINGERS IV SOLN: INTRAVENOUS | Status: DC

## 2011-09-06 MED ORDER — LACTATED RINGERS IR SOLN
Status: DC | PRN
  Administered 2011-09-06: 3000 mL

## 2011-09-06 SURGICAL SUPPLY — 26 items
BANDAGE ELASTIC 6 VELCRO ST LF (GAUZE/BANDAGES/DRESSINGS) ×2 IMPLANT
BLADE 4.2CUDA (BLADE) ×2 IMPLANT
BLADE CUDA SHAVER 3.5 (BLADE) ×2 IMPLANT
CLOTH BEACON ORANGE TIMEOUT ST (SAFETY) ×2 IMPLANT
CUFF TOURN SGL QUICK 34 (TOURNIQUET CUFF) ×1
CUFF TRNQT CYL 34X4X40X1 (TOURNIQUET CUFF) ×1 IMPLANT
DRAPE U-SHAPE 47X51 STRL (DRAPES) ×2 IMPLANT
DRSG EMULSION OIL 3X3 NADH (GAUZE/BANDAGES/DRESSINGS) ×2 IMPLANT
DRSG PAD ABDOMINAL 8X10 ST (GAUZE/BANDAGES/DRESSINGS) ×2 IMPLANT
DURAPREP 26ML APPLICATOR (WOUND CARE) ×2 IMPLANT
GLOVE BIO SURGEON STRL SZ7.5 (GLOVE) ×2 IMPLANT
GLOVE BIO SURGEON STRL SZ8 (GLOVE) ×2 IMPLANT
GLOVE BIOGEL PI IND STRL 8 (GLOVE) ×1 IMPLANT
GLOVE BIOGEL PI INDICATOR 8 (GLOVE) ×1
GOWN STRL NON-REIN LRG LVL3 (GOWN DISPOSABLE) ×2 IMPLANT
MANIFOLD NEPTUNE II (INSTRUMENTS) ×4 IMPLANT
PACK ARTHROSCOPY WL (CUSTOM PROCEDURE TRAY) ×2 IMPLANT
PACK ICE MAXI GEL EZY WRAP (MISCELLANEOUS) ×6 IMPLANT
PADDING CAST COTTON 6X4 STRL (CAST SUPPLIES) ×2 IMPLANT
POSITIONER SURGICAL ARM (MISCELLANEOUS) ×2 IMPLANT
SET ARTHROSCOPY TUBING (MISCELLANEOUS) ×1
SET ARTHROSCOPY TUBING LN (MISCELLANEOUS) ×1 IMPLANT
SPONGE GAUZE 4X4 12PLY (GAUZE/BANDAGES/DRESSINGS) ×2 IMPLANT
SUT ETHILON 4 0 PS 2 18 (SUTURE) ×2 IMPLANT
TOWEL OR 17X26 10 PK STRL BLUE (TOWEL DISPOSABLE) ×2 IMPLANT
WRAP KNEE MAXI GEL POST OP (GAUZE/BANDAGES/DRESSINGS) ×4 IMPLANT

## 2011-09-06 NOTE — Brief Op Note (Signed)
09/06/2011  4:26 PM  PATIENT:  Heather Benson  78 y.o. female  PRE-OPERATIVE DIAGNOSIS:  right knee hypertrothic synovitis  POST-OPERATIVE DIAGNOSIS:  right knee hypertrothic synovitis  PROCEDURE:  Procedure(s) (LRB): ARTHROSCOPY KNEE (Right) with synovectomy  SURGEON:  Surgeon(s) and Role:    * Gearlean Alf, MD - Primary  PHYSICIAN ASSISTANT:   ASSISTANTS: none   ANESTHESIA:   general  EBL:  Total I/O In: 900 [I.V.:900] Out: -   DRAINS: none   LOCAL MEDICATIONS USED:  MARCAINE     DICTATION: .Other Dictation: Dictation Number [id number]  PLAN OF CARE: Discharge to home after PACU  PATIENT DISPOSITION:  PACU - hemodynamically stable.   Dione Plover Shady Bradish, MD    09/06/2011, 4:30 PM

## 2011-09-06 NOTE — Anesthesia Preprocedure Evaluation (Addendum)
Anesthesia Evaluation  Patient identified by MRN, date of birth, ID band Patient awake    Reviewed: Allergy & Precautions, H&P , NPO status , Patient's Chart, lab work & pertinent test results, reviewed documented beta blocker date and time   History of Anesthesia Complications (+) PONV  Airway Mallampati: II TM Distance: >3 FB Neck ROM: full    Dental  (+) Edentulous Upper   Pulmonary neg pulmonary ROS,  breath sounds clear to auscultation  Pulmonary exam normal       Cardiovascular Exercise Tolerance: Good hypertension, Pt. on home beta blockers and Pt. on medications negative cardio ROS  + dysrhythmias Atrial Fibrillation Rhythm:regular Rate:Normal  Post op arrhythmia in past.  Atrial fib in past hx.   Neuro/Psych Polio age 78.  Mild residual  Neuromuscular disease negative neurological ROS  negative psych ROS   GI/Hepatic negative GI ROS, Neg liver ROS, GERD-  Medicated and Controlled,  Endo/Other  negative endocrine ROS  Renal/GU negative Renal ROS  negative genitourinary   Musculoskeletal   Abdominal   Peds  Hematology negative hematology ROS (+)   Anesthesia Other Findings   Reproductive/Obstetrics negative OB ROS                          Anesthesia Physical Anesthesia Plan  ASA: III  Anesthesia Plan: General   Post-op Pain Management:    Induction: Intravenous  Airway Management Planned: LMA  Additional Equipment:   Intra-op Plan:   Post-operative Plan:   Informed Consent: I have reviewed the patients History and Physical, chart, labs and discussed the procedure including the risks, benefits and alternatives for the proposed anesthesia with the patient or authorized representative who has indicated his/her understanding and acceptance.   Dental Advisory Given  Plan Discussed with: CRNA and Surgeon  Anesthesia Plan Comments:         Anesthesia Quick  Evaluation

## 2011-09-06 NOTE — Anesthesia Postprocedure Evaluation (Signed)
  Anesthesia Post-op Note  Patient: Heather Benson  Procedure(s) Performed: Procedure(s) (LRB): ARTHROSCOPY KNEE (Right)  Patient Location: PACU  Anesthesia Type: General  Level of Consciousness: awake and alert   Airway and Oxygen Therapy: Patient Spontanous Breathing  Post-op Pain: mild  Post-op Assessment: Post-op Vital signs reviewed, Patient's Cardiovascular Status Stable, Respiratory Function Stable, Patent Airway and No signs of Nausea or vomiting  Post-op Vital Signs: stable  Complications: No apparent anesthesia complications

## 2011-09-06 NOTE — Transfer of Care (Signed)
Immediate Anesthesia Transfer of Care Note  Patient: Heather Benson  Procedure(s) Performed: Procedure(s) (LRB): ARTHROSCOPY KNEE (Right)  Patient Location: PACU  Anesthesia Type: General  Level of Consciousness: awake and alert   Airway & Oxygen Therapy: Patient Spontanous Breathing and Patient connected to face mask oxygen  Post-op Assessment: Report given to PACU RN and Post -op Vital signs reviewed and stable  Post vital signs: Reviewed and stable  Complications: No apparent anesthesia complications

## 2011-09-06 NOTE — Preoperative (Signed)
Beta Blockers   Reason not to administer Beta Blockers:Not Applicable 

## 2011-09-06 NOTE — Anesthesia Procedure Notes (Signed)
Procedure Name: LMA Insertion Date/Time: 09/06/2011 3:47 PM Performed by: Danley Danker L Patient Re-evaluated:Patient Re-evaluated prior to inductionOxygen Delivery Method: Circle system utilized Preoxygenation: Pre-oxygenation with 100% oxygen Intubation Type: IV induction Ventilation: Mask ventilation without difficulty LMA: LMA with gastric port inserted Tube type: Oral Number of attempts: 1 Placement Confirmation: breath sounds checked- equal and bilateral and positive ETCO2 Tube secured with: Tape Dental Injury: Teeth and Oropharynx as per pre-operative assessment

## 2011-09-06 NOTE — Anesthesia Postprocedure Evaluation (Signed)
  Anesthesia Post-op Note  Patient: Heather Benson  Procedure(s) Performed: Procedure(s) (LRB): ARTHROSCOPY KNEE (Right)  Patient Location: PACU  Anesthesia Type: General  Level of Consciousness: awake and alert   Airway and Oxygen Therapy: Patient Spontanous Breathing  Post-op Pain: mild  Post-op Assessment: Post-op Vital signs reviewed  Post-op Vital Signs: Reviewed and stable  Complications: No apparent anesthesia complications

## 2011-09-07 NOTE — Op Note (Signed)
NAME:  Heather Benson, Heather Benson             ACCOUNT NO.:  [id number]  MEDICAL RECORD NO.:  [id number]  LOCATION:  [facility]                         FACILITY:  [facility]  PHYSICIAN:  Gaynelle Arabian, M.D.    DATE OF BIRTH:  13-Jan-1932  DATE OF PROCEDURE:  09/06/2011 DATE OF DISCHARGE:  09/06/2011                              OPERATIVE REPORT   PREOPERATIVE DIAGNOSIS:  Right knee hypertrophic synovitis.  POSTOPERATIVE DIAGNOSIS:  Right knee hypertrophic synovitis.  PROCEDURE:  Right knee arthroscopy with synovectomy.  SURGEON:  Gaynelle Arabian, M.D.  ASSISTANT:  None.  ANESTHESIA:  General.  ESTIMATED BLOOD LOSS:  Minimal.  DRAINS:  None.  COMPLICATIONS:  None.  CONDITION:  Stable to Recovery.  BRIEF CLINICAL NOTE:  [name] is a 78 year old female, who had a right total knee arthroplasty done many months ago.  Did very well with regards to rehab and pain relief, but recently has noted painful popping in the knee especially getting up out of a chair and doing stairs.  Exam and history consistent with hypertrophic synovitis or the patellar clunk syndrome.  She has failed nonoperative management and presents for arthroscopy and debridement.  PROCEDURE IN DETAIL:  After successful administration of general anesthetic, a tourniquet was placed high on her right thigh and her right lower extremity is prepped and draped in usual sterile fashion. Standard superomedial and inferolateral incisions made, inflow cannula passed, superomedial camera passed inferolateral.  Arthroscopic visualization proceeds.  The components appeared in good position and upon probing were well-fixed.  Probing was done to superolateral incision.  There was a large focus of hypertrophic synovial tissue at the junction of the quadriceps tendon and patella.  This would obliterate the suprapatellar area and would lead to the popping during range of motion.  The combination of the 4.2 mm shaver and the ArthroCare device were  used to debride this back to a healthy normal tissue and to recreate the suprapatellar pouch.  Once completed, there was no evidence of any impinging type tissue left.  The arthroscopic equipment was then removed from the lateral and inferior portals.  20 cc of 0.25% Marcaine with epi injected with inflow cannula and then that is removed and  then portal closed with interrupted 4-0 nylon.  The incisions were cleaned and dried, and bulky sterile dressing applied. She is awake and transported to recovery in stable condition.     Gaynelle Arabian, M.D.     FA/MEDQ  D:  09/06/2011  T:  09/07/2011  Job:  [id number]

## 2011-09-09 ENCOUNTER — Encounter (HOSPITAL_COMMUNITY): Payer: Self-pay | Admitting: Orthopedic Surgery

## 2011-10-01 DIAGNOSIS — I872 Venous insufficiency (chronic) (peripheral): Secondary | ICD-10-CM | POA: Diagnosis not present

## 2011-10-02 DIAGNOSIS — R609 Edema, unspecified: Secondary | ICD-10-CM | POA: Diagnosis not present

## 2011-10-02 DIAGNOSIS — J309 Allergic rhinitis, unspecified: Secondary | ICD-10-CM | POA: Diagnosis not present

## 2011-10-02 DIAGNOSIS — I10 Essential (primary) hypertension: Secondary | ICD-10-CM | POA: Diagnosis not present

## 2011-10-14 ENCOUNTER — Ambulatory Visit
Admission: RE | Admit: 2011-10-14 | Discharge: 2011-10-14 | Disposition: A | Discharge status: Home / Self Care | Payer: Medicare Other | Source: Ambulatory Visit | Attending: Family Medicine | Admitting: Family Medicine

## 2011-10-14 ENCOUNTER — Other Ambulatory Visit: Payer: Self-pay | Admitting: Family Medicine

## 2011-10-14 DIAGNOSIS — R05 Cough: Secondary | ICD-10-CM

## 2011-10-14 DIAGNOSIS — I10 Essential (primary) hypertension: Secondary | ICD-10-CM | POA: Diagnosis not present

## 2011-10-14 DIAGNOSIS — K219 Gastro-esophageal reflux disease without esophagitis: Secondary | ICD-10-CM | POA: Diagnosis not present

## 2011-11-08 DIAGNOSIS — N184 Chronic kidney disease, stage 4 (severe): Secondary | ICD-10-CM | POA: Diagnosis not present

## 2011-11-26 DIAGNOSIS — N184 Chronic kidney disease, stage 4 (severe): Secondary | ICD-10-CM | POA: Diagnosis not present

## 2011-11-26 DIAGNOSIS — I129 Hypertensive chronic kidney disease with stage 1 through stage 4 chronic kidney disease, or unspecified chronic kidney disease: Secondary | ICD-10-CM | POA: Diagnosis not present

## 2011-11-26 DIAGNOSIS — I10 Essential (primary) hypertension: Secondary | ICD-10-CM | POA: Diagnosis not present

## 2011-11-28 DIAGNOSIS — Z1231 Encounter for screening mammogram for malignant neoplasm of breast: Secondary | ICD-10-CM | POA: Diagnosis not present

## 2011-11-28 DIAGNOSIS — Z803 Family history of malignant neoplasm of breast: Secondary | ICD-10-CM | POA: Diagnosis not present

## 2011-12-17 DIAGNOSIS — I10 Essential (primary) hypertension: Secondary | ICD-10-CM | POA: Diagnosis not present

## 2011-12-24 DIAGNOSIS — N184 Chronic kidney disease, stage 4 (severe): Secondary | ICD-10-CM | POA: Diagnosis not present

## 2011-12-24 DIAGNOSIS — I129 Hypertensive chronic kidney disease with stage 1 through stage 4 chronic kidney disease, or unspecified chronic kidney disease: Secondary | ICD-10-CM | POA: Diagnosis not present

## 2012-01-09 DIAGNOSIS — M1710 Unilateral primary osteoarthritis, unspecified knee: Secondary | ICD-10-CM | POA: Diagnosis not present

## 2012-02-17 DIAGNOSIS — I890 Lymphedema, not elsewhere classified: Secondary | ICD-10-CM | POA: Diagnosis not present

## 2012-02-18 DIAGNOSIS — N184 Chronic kidney disease, stage 4 (severe): Secondary | ICD-10-CM | POA: Diagnosis not present

## 2012-02-18 DIAGNOSIS — E559 Vitamin D deficiency, unspecified: Secondary | ICD-10-CM | POA: Diagnosis not present

## 2012-02-18 DIAGNOSIS — I129 Hypertensive chronic kidney disease with stage 1 through stage 4 chronic kidney disease, or unspecified chronic kidney disease: Secondary | ICD-10-CM | POA: Diagnosis not present

## 2012-02-18 DIAGNOSIS — R05 Cough: Secondary | ICD-10-CM | POA: Diagnosis not present

## 2012-02-18 DIAGNOSIS — E785 Hyperlipidemia, unspecified: Secondary | ICD-10-CM | POA: Diagnosis not present

## 2012-02-18 DIAGNOSIS — I890 Lymphedema, not elsewhere classified: Secondary | ICD-10-CM | POA: Diagnosis not present

## 2012-02-18 DIAGNOSIS — Z23 Encounter for immunization: Secondary | ICD-10-CM | POA: Diagnosis not present

## 2012-02-24 ENCOUNTER — Other Ambulatory Visit: Payer: Self-pay | Admitting: Family Medicine

## 2012-02-24 ENCOUNTER — Ambulatory Visit
Admission: RE | Admit: 2012-02-24 | Discharge: 2012-02-24 | Disposition: A | Discharge status: Home / Self Care | Payer: Medicare Other | Source: Ambulatory Visit | Attending: Family Medicine | Admitting: Family Medicine

## 2012-02-24 DIAGNOSIS — D485 Neoplasm of uncertain behavior of skin: Secondary | ICD-10-CM | POA: Diagnosis not present

## 2012-02-24 DIAGNOSIS — L821 Other seborrheic keratosis: Secondary | ICD-10-CM | POA: Diagnosis not present

## 2012-02-24 DIAGNOSIS — R05 Cough: Secondary | ICD-10-CM

## 2012-02-24 DIAGNOSIS — Z85820 Personal history of malignant melanoma of skin: Secondary | ICD-10-CM | POA: Diagnosis not present

## 2012-02-24 DIAGNOSIS — L570 Actinic keratosis: Secondary | ICD-10-CM | POA: Diagnosis not present

## 2012-02-24 DIAGNOSIS — I10 Essential (primary) hypertension: Secondary | ICD-10-CM | POA: Diagnosis not present

## 2012-02-25 ENCOUNTER — Other Ambulatory Visit: Payer: Self-pay

## 2012-02-25 DIAGNOSIS — M7989 Other specified soft tissue disorders: Secondary | ICD-10-CM

## 2012-03-20 DIAGNOSIS — N184 Chronic kidney disease, stage 4 (severe): Secondary | ICD-10-CM | POA: Diagnosis not present

## 2012-03-31 ENCOUNTER — Encounter: Payer: Self-pay | Admitting: Vascular Surgery

## 2012-04-01 ENCOUNTER — Ambulatory Visit (INDEPENDENT_AMBULATORY_CARE_PROVIDER_SITE_OTHER): Payer: Medicare Other | Admitting: Vascular Surgery

## 2012-04-01 ENCOUNTER — Encounter (INDEPENDENT_AMBULATORY_CARE_PROVIDER_SITE_OTHER): Payer: Medicare Other | Admitting: *Deleted

## 2012-04-01 ENCOUNTER — Encounter: Payer: Self-pay | Admitting: Vascular Surgery

## 2012-04-01 VITALS — BP 136/87 | HR 61 | Resp 16 | Ht 66.0 in | Wt 226.9 lb

## 2012-04-01 DIAGNOSIS — M7989 Other specified soft tissue disorders: Secondary | ICD-10-CM

## 2012-04-01 DIAGNOSIS — I890 Lymphedema, not elsewhere classified: Secondary | ICD-10-CM

## 2012-04-01 DIAGNOSIS — R600 Localized edema: Secondary | ICD-10-CM

## 2012-04-01 DIAGNOSIS — R609 Edema, unspecified: Secondary | ICD-10-CM

## 2012-04-01 NOTE — Assessment & Plan Note (Signed)
This patient has had a long history of bilateral lower extremity swelling. I think that this is likely related to both some element of lymphedema and also chronic venous insufficiency. I have explained however that the treatment for both of these is the same. We have discussed the importance of intermittent leg elevation and the proper positioning for this. In addition she does wear knee-high medical grade compression stockings faithfully. I have explained that he should probably be replaced every 6 months as they can stretch out and become less effective. I've encouraged her to avoid prolonged standing and to stay as active as possible. She has some incompetence of both the saphenous veins and also the deep system bilaterally. I did not think that having laser ablation of her saphenous veins would significantly impact the swelling in her legs. I will be happy to see her back at any time if any new vascular issues arise. I did reassure her that she has excellent arterial flow.

## 2012-04-01 NOTE — Progress Notes (Signed)
[facility]   Patient name: Heather Benson MRN: [id number] DOB: Dec 23, 1931 Sex: female  REASON FOR CONSULT: Bilateral lower extremity swelling. Referred by Dr. [name]  HPI: Heather Benson is a 78 y.o. female states that she has had many years of bilateral lower extremity swelling. She feels that he has gradually gotten worse over the last year. She denies any history of previous DVT or phlebitis. She denies any abdominal surgery or surgery in her groins. Likewise she has had no radiation therapy in the past. The swelling is aggravated by standing and relieved somewhat with elevation. She has been wearing compression stockings fairly faithfully. She wears knee-high stockings which are medical grade stockings. She does experience some throbbing pain in her legs with standing and also some fatigue in her legs and heavy numbness in both lower extremities. She denies any history of claudication or rest pain.  I have reviewed her records from Dr. Orest Dikes office. She does have a history of hypertension which is been well controlled. In addition she has hyperlipidemia. She has mild chronic kidney disease.  Past Medical History  Diagnosis Date  . Complication of anesthesia 08-28-11    in past arrythmia-  cardiology has seen in past  . Hypertension 08-28-11    tx. meds  . Swelling of extremity 08-28-11    bilateral lower extremities- mid calf down  . GERD (gastroesophageal reflux disease) 08-28-11    tx. omeprazole  . Cancer 08-28-11    Melonoma-cheek(many Yrs ago) , recent bx. left  face lesion, past skin cancer lesions  . Arthritis 08-28-11    right knee pain-surgery planned  . Neuromuscular disorder 08-28-11    hx. Polio age 76-slight residual affects legs  . Dysrhythmia 08-28-11    only immed.  to several days after surgery- hx. A.fib. in past    Family History  Problem Relation Age of Onset  . Cancer Mother   . Heart disease Mother     Varicose Veins  .  Hyperlipidemia Mother   . Hypertension Mother   . Heart disease Father     Heart Disease before age 35  . Hypertension Father   . Cancer Sister     SOCIAL HISTORY: History  Substance Use Topics  . Smoking status: Never Smoker   . Smokeless tobacco: Never Used  . Alcohol Use: No    Allergies  Allergen Reactions  . Codeine     "Spaced out feeling"    Current Outpatient Prescriptions  Medication Sig Dispense Refill  . aspirin 325 MG tablet Take 325 mg by mouth every morning.      . cholecalciferol (VITAMIN D) 1000 UNITS tablet Take 1,000 Units by mouth daily.      . fexofenadine (ALLEGRA) 180 MG tablet Take 180 mg by mouth daily.      . furosemide (LASIX) 20 MG tablet Take by mouth daily.      [name] losartan (COZAAR) 100 MG tablet Take by mouth daily.      [name] losartan-hydrochlorothiazide (HYZAAR) 100-12.5 MG per tablet Take 1 tablet by mouth daily.      . metoprolol succinate (TOPROL-XL) 25 MG 24 hr tablet Take 25 mg by mouth every morning.      [name] omeprazole (PRILOSEC) 20 MG capsule Take 20 mg by mouth daily.      [name] amiodarone (PACERONE) 400 MG tablet Take 400 mg by mouth daily.      [name] tetrahydrozoline 0.05 % ophthalmic solution Place 1 drop into  both eyes 2 (two) times daily.        REVIEW OF SYSTEMS: Valu.Nieves ] denotes positive finding; [  ] denotes negative finding  CARDIOVASCULAR:  [ ]  chest pain   [ ]  chest pressure   [ ]  palpitations   [ ]  orthopnea   [ ]  dyspnea on exertion   [ ]  claudication   [ ]  rest pain   [ ]  DVT   [ ]  phlebitis PULMONARY:   [ ]  productive cough   [ ]  asthma   [ ]  wheezing NEUROLOGIC:   [ ]  weakness  [ ]  paresthesias  [ ]  aphasia  [ ]  amaurosis  [ ]  dizziness HEMATOLOGIC:   [ ]  bleeding problems   [ ]  clotting disorders MUSCULOSKELETAL:  [ ]  joint pain   [ ]  joint swelling [ ]  leg swelling GASTROINTESTINAL: [ ]   blood in stool  [ ]   hematemesis GENITOURINARY:  [ ]   dysuria  [ ]   hematuria PSYCHIATRIC:  [ ]  history of major depression INTEGUMENTARY:  [ ]   rashes  [ ]  ulcers CONSTITUTIONAL:  [ ]  fever   [ ]  chills  PHYSICAL EXAM: Filed Vitals:   04/01/12 1100  BP: 136/87  Pulse: 61  Resp: 16  Height: 5\' 6"  (1.676 m)  Weight: 226 lb 14.4 oz (102.921 kg)  SpO2: 97%   Body mass index is 36.62 kg/(m^2). GENERAL: The patient is a well-nourished female, in no acute distress. The vital signs are documented above. CARDIOVASCULAR: There is a regular rate and rhythm. I do not detect carotid bruits. She has palpable popliteal and pedal pulses bilaterally. She has significant bilateral lower extremity swelling which is nonpitting. PULMONARY: There is good air exchange bilaterally without wheezing or rales. ABDOMEN: Soft and non-tender with normal pitched bowel sounds.  MUSCULOSKELETAL: There are no major deformities or cyanosis. NEUROLOGIC: No focal weakness or paresthesias are detected. SKIN: she does have some hyperpigmentation bilaterally consistent with chronic venous insufficiency. PSYCHIATRIC: The patient has a normal affect.  DATA:  I have independently interpreted her venous duplex study which shows no evidence of DVT bilaterally. She does have venous valvular incompetence involving bilateral common femoral, femoral and greater saphenous veins.  MEDICAL ISSUES:  Swelling of limb This patient has had a long history of bilateral lower extremity swelling. I think that this is likely related to both some element of lymphedema and also chronic venous insufficiency. I have explained however that the treatment for both of these is the same. We have discussed the importance of intermittent leg elevation and the proper positioning for this. In addition she does wear knee-high medical grade compression stockings faithfully. I have explained that he should probably be replaced every 6 months as they can stretch out and become less effective. I've encouraged her to avoid prolonged standing and to stay as active as possible. She has some incompetence of  both the saphenous veins and also the deep system bilaterally. I did not think that having laser ablation of her saphenous veins would significantly impact the swelling in her legs. I will be happy to see her back at any time if any new vascular issues arise. I did reassure her that she has excellent arterial flow.   [facility] [place] Beeper: [phone]

## 2012-04-02 DIAGNOSIS — M1710 Unilateral primary osteoarthritis, unspecified knee: Secondary | ICD-10-CM | POA: Diagnosis not present

## 2012-04-13 DIAGNOSIS — M1710 Unilateral primary osteoarthritis, unspecified knee: Secondary | ICD-10-CM | POA: Diagnosis not present

## 2012-04-20 DIAGNOSIS — M1710 Unilateral primary osteoarthritis, unspecified knee: Secondary | ICD-10-CM | POA: Diagnosis not present

## 2012-06-02 DIAGNOSIS — M1710 Unilateral primary osteoarthritis, unspecified knee: Secondary | ICD-10-CM | POA: Diagnosis not present

## 2012-06-03 DIAGNOSIS — J019 Acute sinusitis, unspecified: Secondary | ICD-10-CM | POA: Diagnosis not present

## 2012-07-10 DIAGNOSIS — N184 Chronic kidney disease, stage 4 (severe): Secondary | ICD-10-CM | POA: Diagnosis not present

## 2012-08-24 DIAGNOSIS — C44319 Basal cell carcinoma of skin of other parts of face: Secondary | ICD-10-CM | POA: Diagnosis not present

## 2012-08-24 DIAGNOSIS — Z85820 Personal history of malignant melanoma of skin: Secondary | ICD-10-CM | POA: Diagnosis not present

## 2012-08-24 DIAGNOSIS — L821 Other seborrheic keratosis: Secondary | ICD-10-CM | POA: Diagnosis not present

## 2012-08-24 DIAGNOSIS — L570 Actinic keratosis: Secondary | ICD-10-CM | POA: Diagnosis not present

## 2012-08-24 DIAGNOSIS — D485 Neoplasm of uncertain behavior of skin: Secondary | ICD-10-CM | POA: Diagnosis not present

## 2012-09-02 DIAGNOSIS — H40019 Open angle with borderline findings, low risk, unspecified eye: Secondary | ICD-10-CM | POA: Diagnosis not present

## 2012-09-10 DIAGNOSIS — C44319 Basal cell carcinoma of skin of other parts of face: Secondary | ICD-10-CM | POA: Diagnosis not present

## 2012-10-12 DIAGNOSIS — Z1331 Encounter for screening for depression: Secondary | ICD-10-CM | POA: Diagnosis not present

## 2012-10-12 DIAGNOSIS — E785 Hyperlipidemia, unspecified: Secondary | ICD-10-CM | POA: Diagnosis not present

## 2012-10-12 DIAGNOSIS — R609 Edema, unspecified: Secondary | ICD-10-CM | POA: Diagnosis not present

## 2012-10-12 DIAGNOSIS — I129 Hypertensive chronic kidney disease with stage 1 through stage 4 chronic kidney disease, or unspecified chronic kidney disease: Secondary | ICD-10-CM | POA: Diagnosis not present

## 2012-10-12 DIAGNOSIS — R05 Cough: Secondary | ICD-10-CM | POA: Diagnosis not present

## 2012-10-12 DIAGNOSIS — M810 Age-related osteoporosis without current pathological fracture: Secondary | ICD-10-CM | POA: Diagnosis not present

## 2012-10-12 DIAGNOSIS — E559 Vitamin D deficiency, unspecified: Secondary | ICD-10-CM | POA: Diagnosis not present

## 2012-11-03 DIAGNOSIS — L608 Other nail disorders: Secondary | ICD-10-CM | POA: Diagnosis not present

## 2012-11-03 DIAGNOSIS — L03039 Cellulitis of unspecified toe: Secondary | ICD-10-CM | POA: Diagnosis not present

## 2012-11-23 DIAGNOSIS — N184 Chronic kidney disease, stage 4 (severe): Secondary | ICD-10-CM | POA: Diagnosis not present

## 2012-11-23 DIAGNOSIS — R609 Edema, unspecified: Secondary | ICD-10-CM | POA: Diagnosis not present

## 2012-11-25 DIAGNOSIS — L821 Other seborrheic keratosis: Secondary | ICD-10-CM | POA: Diagnosis not present

## 2012-11-25 DIAGNOSIS — D485 Neoplasm of uncertain behavior of skin: Secondary | ICD-10-CM | POA: Diagnosis not present

## 2012-11-25 DIAGNOSIS — L570 Actinic keratosis: Secondary | ICD-10-CM | POA: Diagnosis not present

## 2012-11-25 DIAGNOSIS — L608 Other nail disorders: Secondary | ICD-10-CM | POA: Diagnosis not present

## 2012-12-10 DIAGNOSIS — N184 Chronic kidney disease, stage 4 (severe): Secondary | ICD-10-CM | POA: Diagnosis not present

## 2012-12-10 DIAGNOSIS — I120 Hypertensive chronic kidney disease with stage 5 chronic kidney disease or end stage renal disease: Secondary | ICD-10-CM | POA: Diagnosis not present

## 2012-12-15 DIAGNOSIS — L600 Ingrowing nail: Secondary | ICD-10-CM | POA: Diagnosis not present

## 2012-12-15 DIAGNOSIS — L608 Other nail disorders: Secondary | ICD-10-CM | POA: Diagnosis not present

## 2012-12-31 DIAGNOSIS — M810 Age-related osteoporosis without current pathological fracture: Secondary | ICD-10-CM | POA: Diagnosis not present

## 2013-01-19 DIAGNOSIS — I10 Essential (primary) hypertension: Secondary | ICD-10-CM | POA: Diagnosis not present

## 2013-01-19 DIAGNOSIS — I129 Hypertensive chronic kidney disease with stage 1 through stage 4 chronic kidney disease, or unspecified chronic kidney disease: Secondary | ICD-10-CM | POA: Diagnosis not present

## 2013-01-19 DIAGNOSIS — N2581 Secondary hyperparathyroidism of renal origin: Secondary | ICD-10-CM | POA: Diagnosis not present

## 2013-01-19 DIAGNOSIS — I951 Orthostatic hypotension: Secondary | ICD-10-CM | POA: Diagnosis not present

## 2013-01-19 DIAGNOSIS — Z8349 Family history of other endocrine, nutritional and metabolic diseases: Secondary | ICD-10-CM | POA: Diagnosis not present

## 2013-01-19 DIAGNOSIS — N184 Chronic kidney disease, stage 4 (severe): Secondary | ICD-10-CM | POA: Diagnosis not present

## 2013-02-10 DIAGNOSIS — Z85820 Personal history of malignant melanoma of skin: Secondary | ICD-10-CM | POA: Diagnosis not present

## 2013-02-10 DIAGNOSIS — D485 Neoplasm of uncertain behavior of skin: Secondary | ICD-10-CM | POA: Diagnosis not present

## 2013-02-10 DIAGNOSIS — L570 Actinic keratosis: Secondary | ICD-10-CM | POA: Diagnosis not present

## 2013-02-10 DIAGNOSIS — C44319 Basal cell carcinoma of skin of other parts of face: Secondary | ICD-10-CM | POA: Diagnosis not present

## 2013-02-10 DIAGNOSIS — L820 Inflamed seborrheic keratosis: Secondary | ICD-10-CM | POA: Diagnosis not present

## 2013-02-10 DIAGNOSIS — D239 Other benign neoplasm of skin, unspecified: Secondary | ICD-10-CM | POA: Diagnosis not present

## 2013-02-10 DIAGNOSIS — Z85828 Personal history of other malignant neoplasm of skin: Secondary | ICD-10-CM | POA: Diagnosis not present

## 2013-02-10 DIAGNOSIS — L821 Other seborrheic keratosis: Secondary | ICD-10-CM | POA: Diagnosis not present

## 2013-02-17 DIAGNOSIS — Z23 Encounter for immunization: Secondary | ICD-10-CM | POA: Diagnosis not present

## 2013-02-22 DIAGNOSIS — Z1231 Encounter for screening mammogram for malignant neoplasm of breast: Secondary | ICD-10-CM | POA: Diagnosis not present

## 2013-03-16 DIAGNOSIS — N184 Chronic kidney disease, stage 4 (severe): Secondary | ICD-10-CM | POA: Diagnosis not present

## 2013-03-25 DIAGNOSIS — S81009A Unspecified open wound, unspecified knee, initial encounter: Secondary | ICD-10-CM | POA: Diagnosis not present

## 2013-04-08 DIAGNOSIS — B9789 Other viral agents as the cause of diseases classified elsewhere: Secondary | ICD-10-CM | POA: Diagnosis not present

## 2013-04-13 DIAGNOSIS — H9209 Otalgia, unspecified ear: Secondary | ICD-10-CM | POA: Diagnosis not present

## 2013-04-13 DIAGNOSIS — R51 Headache: Secondary | ICD-10-CM | POA: Diagnosis not present

## 2013-04-13 DIAGNOSIS — L02419 Cutaneous abscess of limb, unspecified: Secondary | ICD-10-CM | POA: Diagnosis not present

## 2013-04-21 DIAGNOSIS — R700 Elevated erythrocyte sedimentation rate: Secondary | ICD-10-CM | POA: Diagnosis not present

## 2013-06-14 DIAGNOSIS — Z85820 Personal history of malignant melanoma of skin: Secondary | ICD-10-CM | POA: Diagnosis not present

## 2013-06-14 DIAGNOSIS — D485 Neoplasm of uncertain behavior of skin: Secondary | ICD-10-CM | POA: Diagnosis not present

## 2013-06-14 DIAGNOSIS — Z85828 Personal history of other malignant neoplasm of skin: Secondary | ICD-10-CM | POA: Diagnosis not present

## 2013-06-14 DIAGNOSIS — L98499 Non-pressure chronic ulcer of skin of other sites with unspecified severity: Secondary | ICD-10-CM | POA: Diagnosis not present

## 2013-06-14 DIAGNOSIS — L821 Other seborrheic keratosis: Secondary | ICD-10-CM | POA: Diagnosis not present

## 2013-06-14 DIAGNOSIS — L570 Actinic keratosis: Secondary | ICD-10-CM | POA: Diagnosis not present

## 2013-10-06 DIAGNOSIS — N184 Chronic kidney disease, stage 4 (severe): Secondary | ICD-10-CM | POA: Diagnosis not present

## 2013-11-01 DIAGNOSIS — H26499 Other secondary cataract, unspecified eye: Secondary | ICD-10-CM | POA: Diagnosis not present

## 2013-11-01 DIAGNOSIS — H40019 Open angle with borderline findings, low risk, unspecified eye: Secondary | ICD-10-CM | POA: Diagnosis not present

## 2013-12-02 ENCOUNTER — Encounter (HOSPITAL_COMMUNITY): Payer: Self-pay | Admitting: Emergency Medicine

## 2013-12-02 ENCOUNTER — Emergency Department (HOSPITAL_COMMUNITY): Payer: Medicare Other

## 2013-12-02 ENCOUNTER — Emergency Department (HOSPITAL_COMMUNITY)
Admission: EM | Admit: 2013-12-02 | Discharge: 2013-12-03 | Disposition: A | Discharge status: Home / Self Care | Payer: Medicare Other | Attending: Emergency Medicine | Admitting: Emergency Medicine

## 2013-12-02 DIAGNOSIS — I4891 Unspecified atrial fibrillation: Secondary | ICD-10-CM | POA: Diagnosis not present

## 2013-12-02 DIAGNOSIS — Z7982 Long term (current) use of aspirin: Secondary | ICD-10-CM | POA: Diagnosis not present

## 2013-12-02 DIAGNOSIS — Z79899 Other long term (current) drug therapy: Secondary | ICD-10-CM | POA: Diagnosis not present

## 2013-12-02 DIAGNOSIS — R609 Edema, unspecified: Secondary | ICD-10-CM | POA: Diagnosis not present

## 2013-12-02 DIAGNOSIS — IMO0002 Reserved for concepts with insufficient information to code with codable children: Secondary | ICD-10-CM | POA: Diagnosis not present

## 2013-12-02 DIAGNOSIS — J159 Unspecified bacterial pneumonia: Secondary | ICD-10-CM | POA: Insufficient documentation

## 2013-12-02 DIAGNOSIS — R071 Chest pain on breathing: Secondary | ICD-10-CM | POA: Insufficient documentation

## 2013-12-02 DIAGNOSIS — R079 Chest pain, unspecified: Secondary | ICD-10-CM | POA: Diagnosis not present

## 2013-12-02 DIAGNOSIS — M129 Arthropathy, unspecified: Secondary | ICD-10-CM | POA: Diagnosis not present

## 2013-12-02 DIAGNOSIS — Z8669 Personal history of other diseases of the nervous system and sense organs: Secondary | ICD-10-CM | POA: Insufficient documentation

## 2013-12-02 DIAGNOSIS — R000 Tachycardia, unspecified: Secondary | ICD-10-CM | POA: Diagnosis not present

## 2013-12-02 DIAGNOSIS — I10 Essential (primary) hypertension: Secondary | ICD-10-CM | POA: Diagnosis not present

## 2013-12-02 DIAGNOSIS — Z85820 Personal history of malignant melanoma of skin: Secondary | ICD-10-CM | POA: Insufficient documentation

## 2013-12-02 DIAGNOSIS — K219 Gastro-esophageal reflux disease without esophagitis: Secondary | ICD-10-CM | POA: Insufficient documentation

## 2013-12-02 DIAGNOSIS — J189 Pneumonia, unspecified organism: Secondary | ICD-10-CM | POA: Diagnosis not present

## 2013-12-02 DIAGNOSIS — R0781 Pleurodynia: Secondary | ICD-10-CM

## 2013-12-02 DIAGNOSIS — J9819 Other pulmonary collapse: Secondary | ICD-10-CM | POA: Diagnosis not present

## 2013-12-02 LAB — CBC WITH DIFFERENTIAL/PLATELET
BASOS PCT: 0 % (ref 0–1)
Basophils Absolute: 0 10*3/uL (ref 0.0–0.1)
EOS ABS: 0.6 10*3/uL (ref 0.0–0.7)
Eosinophils Relative: 5 % (ref 0–5)
HCT: 42.7 % (ref 36.0–46.0)
Hemoglobin: 12.4 g/dL (ref 12.0–15.0)
LYMPHS ABS: 2.8 10*3/uL (ref 0.7–4.0)
Lymphocytes Relative: 26 % (ref 12–46)
MCH: 25.9 pg — AB (ref 26.0–34.0)
MCHC: 29 g/dL — AB (ref 30.0–36.0)
MCV: 89.1 fL (ref 78.0–100.0)
Monocytes Absolute: 1.1 10*3/uL — ABNORMAL HIGH (ref 0.1–1.0)
Monocytes Relative: 10 % (ref 3–12)
NEUTROS ABS: 6.6 10*3/uL (ref 1.7–7.7)
NEUTROS PCT: 59 % (ref 43–77)
PLATELETS: 222 10*3/uL (ref 150–400)
RBC: 4.79 MIL/uL (ref 3.87–5.11)
RDW: 17.2 % — ABNORMAL HIGH (ref 11.5–15.5)
WBC: 11.1 10*3/uL — ABNORMAL HIGH (ref 4.0–10.5)

## 2013-12-02 LAB — COMPREHENSIVE METABOLIC PANEL
ALBUMIN: 3.6 g/dL (ref 3.5–5.2)
ALK PHOS: 64 U/L (ref 39–117)
ALT: 36 U/L — ABNORMAL HIGH (ref 0–35)
ANION GAP: 15 (ref 5–15)
AST: 44 U/L — ABNORMAL HIGH (ref 0–37)
BUN: 32 mg/dL — AB (ref 6–23)
CO2: 26 mEq/L (ref 19–32)
CREATININE: 1.7 mg/dL — AB (ref 0.50–1.10)
Calcium: 9.5 mg/dL (ref 8.4–10.5)
Chloride: 101 mEq/L (ref 96–112)
GFR calc non Af Amer: 27 mL/min — ABNORMAL LOW (ref >90)
GFR, EST AFRICAN AMERICAN: 32 mL/min — AB (ref >90)
GLUCOSE: 106 mg/dL — AB (ref 70–99)
POTASSIUM: 4.1 meq/L (ref 3.7–5.3)
Sodium: 142 mEq/L (ref 137–147)
TOTAL PROTEIN: 7.7 g/dL (ref 6.0–8.3)
Total Bilirubin: 0.4 mg/dL (ref 0.3–1.2)

## 2013-12-02 LAB — I-STAT TROPONIN, ED: TROPONIN I, POC: 0 ng/mL (ref 0.00–0.08)

## 2013-12-02 LAB — LIPASE, BLOOD: LIPASE: 49 U/L (ref 11–59)

## 2013-12-02 LAB — PRO B NATRIURETIC PEPTIDE: PRO B NATRI PEPTIDE: 566.4 pg/mL — AB (ref 0–450)

## 2013-12-02 MED ORDER — SODIUM CHLORIDE 0.9 % IV BOLUS (SEPSIS)
250.0000 mL | Freq: Once | INTRAVENOUS | Status: AC
  Administered 2013-12-02: 250 mL via INTRAVENOUS

## 2013-12-02 MED ORDER — NITROGLYCERIN 0.4 MG SL SUBL
0.4000 mg | SUBLINGUAL_TABLET | SUBLINGUAL | Status: DC | PRN
Start: 2013-12-02 — End: 2013-12-03

## 2013-12-02 MED ORDER — SODIUM CHLORIDE 0.9 % IV SOLN
INTRAVENOUS | Status: DC
  Administered 2013-12-03: 02:00:00 via INTRAVENOUS

## 2013-12-02 NOTE — ED Notes (Signed)
Patient returned from X-ray 

## 2013-12-02 NOTE — ED Notes (Signed)
Per EMS, Patient has been experiencing episodes where she feels as if her "heart is running off." Today patient has had chest pain, with discomfort and SOB. Patient compares it with the feeling of indigestion and pneumonia. Pain becomes worse with breathing and palpitations. Patient has a history of Atrial Fibrillation, Exploratory Cath around five years ago, and takes Lasix for Leg Swelling. Patient denies N/V/D. Vitals per EMS: 150/100, 110 HR, 97 % on RA.

## 2013-12-03 ENCOUNTER — Emergency Department (HOSPITAL_COMMUNITY): Payer: Medicare Other

## 2013-12-03 ENCOUNTER — Encounter (HOSPITAL_COMMUNITY): Payer: Self-pay | Admitting: Radiology

## 2013-12-03 DIAGNOSIS — R079 Chest pain, unspecified: Secondary | ICD-10-CM | POA: Diagnosis not present

## 2013-12-03 DIAGNOSIS — I4891 Unspecified atrial fibrillation: Secondary | ICD-10-CM | POA: Diagnosis not present

## 2013-12-03 DIAGNOSIS — J9819 Other pulmonary collapse: Secondary | ICD-10-CM | POA: Diagnosis not present

## 2013-12-03 LAB — I-STAT TROPONIN, ED: TROPONIN I, POC: 0.01 ng/mL (ref 0.00–0.08)

## 2013-12-03 LAB — D-DIMER, QUANTITATIVE (NOT AT ARMC): D-Dimer, Quant: 1.38 ug/mL-FEU — ABNORMAL HIGH (ref 0.00–0.48)

## 2013-12-03 MED ORDER — AZITHROMYCIN 250 MG PO TABS: ORAL_TABLET | ORAL | Status: DC

## 2013-12-03 MED ORDER — DEXTROSE 5 % IV SOLN
1.0000 g | Freq: Once | INTRAVENOUS | Status: AC
  Administered 2013-12-03: 1 g via INTRAVENOUS
  Filled 2013-12-03: qty 10

## 2013-12-03 MED ORDER — TECHNETIUM TC 99M DIETHYLENETRIAME-PENTAACETIC ACID: 40.0000 | Freq: Once | INTRAVENOUS | Status: DC | PRN

## 2013-12-03 MED ORDER — TECHNETIUM TO 99M ALBUMIN AGGREGATED
6.0000 | Freq: Once | INTRAVENOUS | Status: AC | PRN
  Administered 2013-12-03: 6 via INTRAVENOUS

## 2013-12-03 MED ORDER — AZITHROMYCIN 250 MG PO TABS
500.0000 mg | ORAL_TABLET | Freq: Once | ORAL | Status: AC
  Administered 2013-12-03: 500 mg via ORAL
  Filled 2013-12-03: qty 2

## 2013-12-03 NOTE — ED Provider Notes (Signed)
CSN: [id number]     Arrival date & time 12/02/13  1933 History   First MD Initiated Contact with Patient 12/02/13 1934     Chief Complaint  Patient presents with  . Atrial Fibrillation     (Consider location/radiation/quality/duration/timing/severity/associated sxs/prior Treatment) Patient is a 80 y.o. female presenting with atrial fibrillation. The history is provided by the patient and the EMS personnel.  Atrial Fibrillation Associated symptoms include chest pain and shortness of breath. Pertinent negatives include no abdominal pain and no headaches.   patient came in by EMS. Usually was billed as atrial fibrillation with a heart rate around 110. Sats were 97% on room air. Patient's been experiencing episodes of pleuritic chest pain now with some palpitate sheets. Patient can pairs the feeling to indigestion or when she had pneumonia in the past. Patient really has no chest pain without taking a deep breath. Patient had a cardiac cath 5 years ago currently nothing was significantly abnormal there. Patient has had leg swelling, long term and takes Lasix for that.  Past Medical History  Diagnosis Date  . Complication of anesthesia 08-28-11    in past arrythmia-  cardiology has seen in past  . Hypertension 08-28-11    tx. meds  . Swelling of extremity 08-28-11    bilateral lower extremities- mid calf down  . GERD (gastroesophageal reflux disease) 08-28-11    tx. omeprazole  . Cancer 08-28-11    Melonoma-cheek(many Yrs ago) , recent bx. left  face lesion, past skin cancer lesions  . Arthritis 08-28-11    right knee pain-surgery planned  . Neuromuscular disorder 08-28-11    hx. Polio age 17-slight residual affects legs  . Dysrhythmia 08-28-11    only immed.  to several days after surgery- hx. A.fib. in past   Past Surgical History  Procedure Laterality Date  . Cataract extraction, bilateral  08-28-11    bilateral  . Abdominal hysterectomy  08-28-11    Partial-vaginal approach  .  Bunionectomy  08-28-11    right  . Knee arthroscopy  09/06/2011    Procedure: ARTHROSCOPY KNEE;  Surgeon: Gearlean Alf, MD;  Location: [facility];  Service: Orthopedics;  Laterality: Right;  with Synovectomy  . Joint replacement  Oct. 1, 2012    Right Knee  . Eye surgery      Cataract   Family History  Problem Relation Age of Onset  . Cancer Mother   . Heart disease Mother     Varicose Veins  . Hyperlipidemia Mother   . Hypertension Mother   . Heart disease Father     Heart Disease before age 65  . Hypertension Father   . Cancer Sister    History  Substance Use Topics  . Smoking status: Never Smoker   . Smokeless tobacco: Never Used  . Alcohol Use: No   OB History   Grav Para Term Preterm Abortions TAB SAB Ect Mult Living                 Review of Systems  Constitutional: Negative for fever.  HENT: Negative for congestion.   Eyes: Negative for visual disturbance.  Respiratory: Positive for shortness of breath.   Cardiovascular: Positive for chest pain and leg swelling.  Gastrointestinal: Negative for nausea, vomiting and abdominal pain.  Genitourinary: Negative for dysuria.  Musculoskeletal: Negative for back pain.  Skin: Negative for rash.  Neurological: Negative for headaches.  Hematological: Does not bruise/bleed easily.  Psychiatric/Behavioral: Negative for confusion.  Allergies  Codeine  Home Medications   Prior to Admission medications   Medication Sig Start Date End Date Taking? Authorizing Provider  aspirin 325 MG tablet Take 325 mg by mouth every morning.   Yes Historical Provider, MD  Cholecalciferol (VITAMIN D3) 2000 UNITS capsule Take 2,000 Units by mouth daily.   Yes Historical Provider, MD  fexofenadine (ALLEGRA) 180 MG tablet Take 180 mg by mouth daily.   Yes Historical Provider, MD  fluticasone (FLONASE) 50 MCG/ACT nasal spray Place 1 spray into the nose daily as needed for allergies.  09/27/13  Yes Historical Provider, MD  furosemide  (LASIX) 40 MG tablet Take 40 mg by mouth daily. Can take an additional tablet if swelling doesn't decrease   Yes Historical Provider, MD  metoprolol succinate (TOPROL-XL) 25 MG 24 hr tablet Take 25 mg by mouth every morning.   Yes Historical Provider, MD  omeprazole (PRILOSEC) 20 MG capsule Take 20 mg by mouth daily.   Yes Historical Provider, MD  OVER THE COUNTER MEDICATION Take 1 tablet by mouth daily. Move Free Ultra   Yes Historical Provider, MD  tetrahydrozoline 0.05 % ophthalmic solution Place 1 drop into both eyes 2 (two) times daily.   Yes Historical Provider, MD   BP 129/53  Pulse 76  Temp(Src) 98.6 F (37 C) (Oral)  Resp 18  SpO2 96% Physical Exam  Nursing note and vitals reviewed. Constitutional: She is oriented to person, place, and time. She appears well-developed and well-nourished. No distress.  HENT:  Head: Normocephalic and atraumatic.  Mouth/Throat: Oropharynx is clear and moist.  Eyes: Conjunctivae and EOM are normal. Pupils are equal, round, and reactive to light.  Cardiovascular: Normal rate, regular rhythm and normal heart sounds.   No murmur heard. Pulmonary/Chest: Effort normal and breath sounds normal. No respiratory distress.  Abdominal: Soft. Bowel sounds are normal. There is no tenderness.  Musculoskeletal: Normal range of motion. She exhibits edema. She exhibits no tenderness.  Neurological: She is alert and oriented to person, place, and time. No cranial nerve deficit. She exhibits normal muscle tone. Coordination normal.  Skin: Skin is warm. No rash noted.    ED Course  Procedures (including critical care time) Labs Review Labs Reviewed  COMPREHENSIVE METABOLIC PANEL - Abnormal; Notable for the following:    Glucose, Bld 106 (*)    BUN 32 (*)    Creatinine, Ser 1.70 (*)    AST 44 (*)    ALT 36 (*)    GFR calc non Af Amer 27 (*)    GFR calc Af Amer 32 (*)    All other components within normal limits  CBC WITH DIFFERENTIAL - Abnormal; Notable for  the following:    WBC 11.1 (*)    MCH 25.9 (*)    MCHC 29.0 (*)    RDW 17.2 (*)    Monocytes Absolute 1.1 (*)    All other components within normal limits  PRO B NATRIURETIC PEPTIDE - Abnormal; Notable for the following:    Pro B Natriuretic peptide (BNP) 566.4 (*)    All other components within normal limits  D-DIMER, QUANTITATIVE - Abnormal; Notable for the following:    D-Dimer, Quant 1.38 (*)    All other components within normal limits  LIPASE, BLOOD  I-STAT TROPOININ, ED  I-STAT TROPOININ, ED  I-STAT TROPOININ, ED   Results for orders placed during the hospital encounter of 12/02/13  COMPREHENSIVE METABOLIC PANEL      Result Value Ref Range   Sodium  142  137 - 147 mEq/L   Potassium 4.1  3.7 - 5.3 mEq/L   Chloride 101  96 - 112 mEq/L   CO2 26  19 - 32 mEq/L   Glucose, Bld 106 (*) 70 - 99 mg/dL   BUN 32 (*) 6 - 23 mg/dL   Creatinine, Ser 1.70 (*) 0.50 - 1.10 mg/dL   Calcium 9.5  8.4 - 10.5 mg/dL   Total Protein 7.7  6.0 - 8.3 g/dL   Albumin 3.6  3.5 - 5.2 g/dL   AST 44 (*) 0 - 37 U/L   ALT 36 (*) 0 - 35 U/L   Alkaline Phosphatase 64  39 - 117 U/L   Total Bilirubin 0.4  0.3 - 1.2 mg/dL   GFR calc non Af Amer 27 (*) >90 mL/min   GFR calc Af Amer 32 (*) >90 mL/min   Anion gap 15  5 - 15  LIPASE, BLOOD      Result Value Ref Range   Lipase 49  11 - 59 U/L  CBC WITH DIFFERENTIAL      Result Value Ref Range   WBC 11.1 (*) 4.0 - 10.5 K/uL   RBC 4.79  3.87 - 5.11 MIL/uL   Hemoglobin 12.4  12.0 - 15.0 g/dL   HCT 42.7  36.0 - 46.0 %   MCV 89.1  78.0 - 100.0 fL   MCH 25.9 (*) 26.0 - 34.0 pg   MCHC 29.0 (*) 30.0 - 36.0 g/dL   RDW 17.2 (*) 11.5 - 15.5 %   Platelets 222  150 - 400 K/uL   Neutrophils Relative % 59  43 - 77 %   Neutro Abs 6.6  1.7 - 7.7 K/uL   Lymphocytes Relative 26  12 - 46 %   Lymphs Abs 2.8  0.7 - 4.0 K/uL   Monocytes Relative 10  3 - 12 %   Monocytes Absolute 1.1 (*) 0.1 - 1.0 K/uL   Eosinophils Relative 5  0 - 5 %   Eosinophils Absolute 0.6  0.0 -  0.7 K/uL   Basophils Relative 0  0 - 1 %   Basophils Absolute 0.0  0.0 - 0.1 K/uL  PRO B NATRIURETIC PEPTIDE      Result Value Ref Range   Pro B Natriuretic peptide (BNP) 566.4 (*) 0 - 450 pg/mL  D-DIMER, QUANTITATIVE      Result Value Ref Range   D-Dimer, Quant 1.38 (*) 0.00 - 0.48 ug/mL-FEU  I-STAT TROPOININ, ED      Result Value Ref Range   Troponin i, poc 0.00  0.00 - 0.08 ng/mL   Comment 3           I-STAT TROPOININ, ED      Result Value Ref Range   Troponin i, poc 0.01  0.00 - 0.08 ng/mL   Comment 3              Imaging Review Dg Chest 2 View  12/02/2013   CLINICAL DATA:  Tachycardia with chest pain and shortness of breath. Atrial fibrillation.  EXAM: CHEST  2 VIEW  COMPARISON:  02/24/2012 and 10/14/2011.  FINDINGS: The heart size and mediastinal contours are stable with aortic tortuosity. There is stable mild chronic central airway thickening. No edema, confluent airspace opacity or pleural effusion is seen. The bones are demineralized with a stable lower thoracic compression deformity.  IMPRESSION: Stable chest.  No acute cardiopulmonary process.   Electronically Signed   By: Modesta Messing.D.  On: 12/02/2013 20:55   Ct Chest Wo Contrast  12/03/2013   CLINICAL DATA:  Chest pain, shortness of breath and palpitations.  EXAM: CT CHEST WITHOUT CONTRAST  TECHNIQUE: Multidetector CT imaging of the chest was performed following the standard protocol without IV contrast.  COMPARISON:  Chest radiograph performed 12/02/2013, and CTA of the chest performed 05/27/2010  FINDINGS: Mild bibasilar atelectasis is noted, more prominent on the right. Minimal hazy opacity at the anterior right upper lobe and inferior aspect of the left upper lobe likely also reflects atelectasis. Infection is thought to be less likely. No pleural effusion or pneumothorax is seen. No masses are identified.  Dense calcification is noted at the mitral valve. Scattered coronary artery calcifications are seen. There is  apparent mild prominence of the pulmonary arteries, which could reflect mild pulmonary arterial hypertension. Scattered mediastinal nodes remain normal in size. The great vessels are grossly unremarkable in appearance.  The visualized portions of the thyroid gland are unremarkable. No axillary lymphadenopathy is seen.  The visualized portions of the liver and spleen are unremarkable in appearance. The visualized portions of the gallbladder are within normal limits. The visualized portions of the pancreas and adrenal glands are grossly unremarkable. A few small renal cysts are noted bilaterally. A tiny hiatal hernia is seen.  No acute osseous abnormalities are identified. There is stable chronic loss of height at vertebral body T12.  IMPRESSION: 1. Mild bibasilar atelectasis noted, more prominent on the right. Minimal hazy opacity at the anterior right upper lobe and inferior aspect of the left upper lobe likely also reflects atelectasis. Infection is thought to be less likely. 2. Dense calcification at the mitral valve. 3. Scattered coronary artery calcifications seen. 4. Apparent mild prominence of the pulmonary arteries, which could reflect mild pulmonary arterial hypertension. 5. Likely small bilateral renal cysts seen. 6. Tiny hiatal hernia noted. 7. Stable chronic loss of height at vertebral body T12.   Electronically Signed   By: Garald Balding M.D.   On: 12/03/2013 00:53     EKG Interpretation   Date/Time:  Thursday December 02 2013 19:40:27 EDT Ventricular Rate:  117 PR Interval:  141 QRS Duration: 111 QT Interval:  352 QTC Calculation: 491 R Axis:   -50 Text Interpretation:  Atrial fibrillation Abnormal R-wave progression,  early transition LVH with IVCD, LAD and secondary repol abnrm Borderline  prolonged QT interval Artifact Confirmed by Rogene Houston  MD, Crystian Frith [id number])  on 12/02/2013 7:50:48 PM      MDM   Final diagnoses:  Pleuritic chest pain  Atrial fibrillation, unspecified     Patient with known history of atrial fibrillation. Patient heart rate improved here in the emergency apartment. Never was really above like 117. Patient with pleuritic chest pain. Cardiac standpoint no evidence of any acute cardiac event. EKG without acute changes. Chest x-ray was negative. Negative for pneumonia pneumothorax or pulmonary edema. Patient's had troponin x2 both negative. Patient's chest pain has only been pearly in nature and mostly only present with taking a deep breath. Never had any prolonged chest pain lasting 15 or 20 minutes or longer. Because of the low bit tachycardia and pleuritic chest pain. Some concerns were obviously present for the possibility of a pulmonary embolus on the rest of the workup was negative. Patient's renal function did not allow CT angios chest. Regular CT of the chest was done along with a d-dimer. Patient only stated that the symptoms seem to remind her when she had pneumonia in the past. Patient's  room air sats of always been in the mid to upper 90s. CT scan raises concern perhaps for early pneumonia. Patient will be asked initiated on antibiotics for community-acquired pneumonia. No recent admissions to the hospital. Patient to receive Rocephin 1 g and Zithromax by mouth. Patient will have a VQ scan scheduled to rule out the coronary embolus. Patient's d-dimer was elevated at greater than 1.0. If VQ scan is negative would treat patient as if it is a community-acquired pneumonia and pleuritic chest pain. If VQ scan is positive not low were normal probability may require admission.    Fredia Sorrow, MD 12/03/13 [phone]

## 2013-12-03 NOTE — ED Notes (Signed)
Notified Dr. Christy Gentles pt has returned from procedure and was wanting to eat.

## 2013-12-03 NOTE — ED Notes (Signed)
Patient transported to nuclear medicine for [facility]

## 2013-12-03 NOTE — ED Provider Notes (Signed)
Assumed care in signout to f/u on V/Q study Pt reports pleuritic CP.  No other complaints.  She is in no distress If V/Q study negative, will be discharged home with treatment for CAP Pt agreeable with this plan She reports h/o afib, though repeat ekg at 0832am does not reveal afib.  She reports she is only on ASA as directed by her cardiologist.  She will need f/u for this as outpatient   Date: 12/03/2013 0832am  Rate: 63  Rhythm: normal sinus rhythm  QRS Axis:left  Intervals: normal  ST/T Wave abnormalities: inverted T wave in inferior lead  Conduction Disutrbances:none  Narrative Interpretation:   Old EKG Reviewed: unchanged from EKG fron 12/2008    Sharyon Cable, MD 12/03/13 [phone]

## 2013-12-03 NOTE — ED Notes (Signed)
Spoke with Nuclear Medicine stated will be sending for patient now. Patient notified.

## 2013-12-03 NOTE — ED Provider Notes (Signed)
No PE by V/Q study Pt is in no distress. Her pulse ox >96% on RA on my evaluation She declines pain meds She wants to go home She is stable for outpatient treamtent She has been ambulatory and feels well ambulating We discussed strict return precautions   Sharyon Cable, MD 12/03/13 1228

## 2013-12-13 DIAGNOSIS — L84 Corns and callosities: Secondary | ICD-10-CM | POA: Diagnosis not present

## 2013-12-13 DIAGNOSIS — L570 Actinic keratosis: Secondary | ICD-10-CM | POA: Diagnosis not present

## 2013-12-13 DIAGNOSIS — D485 Neoplasm of uncertain behavior of skin: Secondary | ICD-10-CM | POA: Diagnosis not present

## 2013-12-13 DIAGNOSIS — R0602 Shortness of breath: Secondary | ICD-10-CM | POA: Diagnosis not present

## 2013-12-13 DIAGNOSIS — J189 Pneumonia, unspecified organism: Secondary | ICD-10-CM | POA: Diagnosis not present

## 2013-12-13 DIAGNOSIS — L821 Other seborrheic keratosis: Secondary | ICD-10-CM | POA: Diagnosis not present

## 2013-12-13 DIAGNOSIS — C44319 Basal cell carcinoma of skin of other parts of face: Secondary | ICD-10-CM | POA: Diagnosis not present

## 2013-12-13 DIAGNOSIS — R05 Cough: Secondary | ICD-10-CM | POA: Diagnosis not present

## 2013-12-13 DIAGNOSIS — I4891 Unspecified atrial fibrillation: Secondary | ICD-10-CM | POA: Diagnosis not present

## 2013-12-13 DIAGNOSIS — R059 Cough, unspecified: Secondary | ICD-10-CM | POA: Diagnosis not present

## 2013-12-13 DIAGNOSIS — Z85828 Personal history of other malignant neoplasm of skin: Secondary | ICD-10-CM | POA: Diagnosis not present

## 2013-12-13 DIAGNOSIS — L608 Other nail disorders: Secondary | ICD-10-CM | POA: Diagnosis not present

## 2013-12-15 DIAGNOSIS — Z85828 Personal history of other malignant neoplasm of skin: Secondary | ICD-10-CM | POA: Diagnosis not present

## 2013-12-15 DIAGNOSIS — L600 Ingrowing nail: Secondary | ICD-10-CM | POA: Diagnosis not present

## 2014-01-27 ENCOUNTER — Encounter: Payer: Self-pay | Admitting: Interventional Cardiology

## 2014-01-27 ENCOUNTER — Ambulatory Visit (INDEPENDENT_AMBULATORY_CARE_PROVIDER_SITE_OTHER): Payer: Medicare Other | Admitting: Interventional Cardiology

## 2014-01-27 VITALS — BP 140/80 | HR 60 | Ht 65.5 in | Wt 223.0 lb

## 2014-01-27 DIAGNOSIS — N184 Chronic kidney disease, stage 4 (severe): Secondary | ICD-10-CM | POA: Insufficient documentation

## 2014-01-27 DIAGNOSIS — N189 Chronic kidney disease, unspecified: Secondary | ICD-10-CM

## 2014-01-27 DIAGNOSIS — R609 Edema, unspecified: Secondary | ICD-10-CM

## 2014-01-27 DIAGNOSIS — I4891 Unspecified atrial fibrillation: Secondary | ICD-10-CM

## 2014-01-27 DIAGNOSIS — R0602 Shortness of breath: Secondary | ICD-10-CM | POA: Diagnosis not present

## 2014-01-27 DIAGNOSIS — I480 Paroxysmal atrial fibrillation: Secondary | ICD-10-CM

## 2014-01-27 DIAGNOSIS — R600 Localized edema: Secondary | ICD-10-CM

## 2014-01-27 LAB — BASIC METABOLIC PANEL
BUN: 26 mg/dL — ABNORMAL HIGH (ref 6–23)
CALCIUM: 9.2 mg/dL (ref 8.4–10.5)
CO2: 29 mEq/L (ref 19–32)
CREATININE: 1.8 mg/dL — AB (ref 0.4–1.2)
Chloride: 103 mEq/L (ref 96–112)
GFR: 29.71 mL/min — AB (ref >60.00)
Glucose, Bld: 111 mg/dL — ABNORMAL HIGH (ref 70–99)
Potassium: 4.4 mEq/L (ref 3.5–5.1)
SODIUM: 139 meq/L (ref 135–145)

## 2014-01-27 LAB — BRAIN NATRIURETIC PEPTIDE: Pro B Natriuretic peptide (BNP): 185 pg/mL — ABNORMAL HIGH (ref 0.0–100.0)

## 2014-01-27 NOTE — Patient Instructions (Addendum)
Your physician recommends that you continue on your current medications as directed. Please refer to the Current Medication list given to you today.  Your physician recommends that you return for lab work today for BNP and Bmet.   Your physician has requested that you have an echocardiogram. Echocardiography is a painless test that uses sound waves to create images of your heart. It provides your doctor with information about the size and shape of your heart and how well your heart's chambers and valves are working. This procedure takes approximately one hour. There are no restrictions for this procedure.  Your physician recommends that you schedule a follow-up appointment in: 6 weeks with Dr. Irish Lack (after all testing completed)

## 2014-01-27 NOTE — Progress Notes (Signed)
Patient ID: Heather Benson, female   DOB: 1931/11/12, 80 y.o.   MRN: [id number]    [place], [place], [place]  [address] Phone: [phone] Fax:  [phone]  Date:  01/27/2014   ID:  Heather Benson, DOB 03-21-1932, MRN [id number]  PCP:  Vidal Schwalbe, MD      History of Present Illness: Heather Benson is a 80 y.o. female who has had PAF with surgery on two occasions several years ago. Atrial Fibrillation F/U:  Denies : Chest pain.  Syncope.   Reports: Dizziness- worse with standing up- feels better when she sits back down Leg edema. - treated with lasix-sliding scale based on edema  Palpitations. - worse at night. Not daily. Shortness of breath- worse with walking  BP at home are typically in the 135/80 range.  Lowest have been < 123XX123 systolic.  She will feel dizzy with the low BP readings.  She does not drink a lot of water during the day.  Urine is typically concentrated.        Wt Readings from Last 3 Encounters:  01/27/14 223 lb (101.152 kg)  04/01/12 226 lb 14.4 oz (102.921 kg)  08/28/11 217 lb (98.431 kg)     Past Medical History  Diagnosis Date  . Complication of anesthesia 08-28-11    in past arrythmia-  cardiology has seen in past  . Hypertension 08-28-11    tx. meds  . Swelling of extremity 08-28-11    bilateral lower extremities- mid calf down  . GERD (gastroesophageal reflux disease) 08-28-11    tx. omeprazole  . Cancer 08-28-11    Melonoma-cheek(many Yrs ago) , recent bx. left  face lesion, past skin cancer lesions  . Arthritis 08-28-11    right knee pain-surgery planned  . Neuromuscular disorder 08-28-11    hx. Polio age 89-slight residual affects legs  . Dysrhythmia 08-28-11    only immed.  to several days after surgery- hx. A.fib. in past    Current Outpatient Prescriptions  Medication Sig Dispense Refill  . aspirin 325 MG tablet Take 325 mg by mouth every morning.      . Cholecalciferol (VITAMIN D3) 2000 UNITS capsule  Take 2,000 Units by mouth daily.      . fexofenadine (ALLEGRA) 180 MG tablet Take 180 mg by mouth daily.      . fluticasone (FLONASE) 50 MCG/ACT nasal spray Place 1 spray into the nose daily as needed for allergies.       . furosemide (LASIX) 40 MG tablet Take 40 mg by mouth daily. Can take an additional tablet if swelling doesn't decrease      . metoprolol succinate (TOPROL-XL) 25 MG 24 hr tablet Take 25 mg by mouth every morning.      [name] omeprazole (PRILOSEC) 20 MG capsule Take 20 mg by mouth daily.      [name] OVER THE COUNTER MEDICATION Take 1 tablet by mouth daily. Move Free Ultra      . tetrahydrozoline 0.05 % ophthalmic solution Place 1 drop into both eyes 2 (two) times daily.       No current facility-administered medications for this visit.    Allergies:    Allergies  Allergen Reactions  . Codeine     "Spaced out feeling"    Social History:  The patient  reports that she has never smoked. She has never used smokeless tobacco. She reports that she does not drink alcohol or use illicit drugs.   Family  History:  The patient's family history includes Cancer in her mother and sister; Heart disease in her father and mother; Hyperlipidemia in her mother; Hypertension in her father and mother.   ROS:  Please see the history of present illness.  No nausea, vomiting.  No fevers, chills.  No focal weakness.  No dysuria.    All other systems reviewed and negative.   PHYSICAL EXAM: VS:  BP 140/80  Pulse 60  Ht 5' 5.5" (1.664 m)  Wt 223 lb (101.152 kg)  BMI 36.53 kg/m2  SpO2 99% Well nourished, well developed, in no acute distress HEENT: normal Neck: no JVD, no carotid bruits Cardiac:  normal S1, S2; RRR;  Lungs:  clear to auscultation bilaterally, no wheezing, rhonchi or rales Abd: soft, nontender, no hepatomegaly Ext: no edema Skin: warm and dry Neuro:   no focal abnormalities noted  EKG:    AFib, RVR in 7/15  ASSESSMENT AND PLAN:  Atrial fibrillation  Used Amiodarone HCl  Tablet, 400 MG, 1 tablet, Orally, Once a day, 30 day(s), 30, Refills 11 in the past before surgery in 2013. Treated with prophylactic amiodarone to prevent AFib. Continued aspirin for stroke prevention. Now with increased palpitations.  AFib in 7/15.  Palpitations are similar to the AFib.  She should be on stronger anticoagulation.  She does not want Coumadin.  WIll try NOAC based on CrCl.   Chronic renal insuficiency.  Stop aspirin with NOAC.  HTN:  Some low BP episodes.  She needs to stay well hydrated.  Hopefully, this will reduce orthostatic sx.  SHOB: Plan echocardiogram.  LV function normal in 2012 at cath.  Check BNP , BMet.    Edema: Lasix.  Elevate legs at night as well.     Signed, Mina Marble, MD, [facility] 01/27/2014 9:42 AM

## 2014-01-28 ENCOUNTER — Telehealth: Payer: Self-pay | Admitting: Interventional Cardiology

## 2014-01-28 NOTE — Telephone Encounter (Signed)
New message          Pt would like to know if she can go out of town Sunday 9/20

## 2014-01-28 NOTE — Telephone Encounter (Signed)
Pt calling to ask Dr Irish Lack and CMA if its advisable for her to go [place].  Informed the pt that Dr Irish Lack and his CMA are both out of the office today.  Asked pt how she was feeling, and she stated "I feel better, but still dizzy in the mornings." Pt states she really wants to go on this trip for its already prepaid.  Advised pt that she should go based on how she's feeling.  Advised her if she does go then she should take all her meds as prescribed, try to avoid foods high in sodium, get out of the car every hour and rest and walk around, stay hydrated with water, change positions slowly to avoid worsening of dizziness.  Advised the pt if before Sunday her LEE and sob increase, then she should reconsider going.  Advised pt that if she gets there and starts feeling bad with cp, increased sob, increased dizziness, palpitations, increased LEE, or becomes acutely distressed at all, then she should seek medical attention immediately.  Informed the pt that I will route this message to Dr Irish Lack for further review and recommendation, and follow-up thereafter.  Pt verbalized understanding and agrees with this plan.

## 2014-02-01 NOTE — Telephone Encounter (Signed)
Heather Benson, please advise.

## 2014-02-01 NOTE — Telephone Encounter (Signed)
OK to go on trip.  Check with PharmD as to what would be the best NOAC.

## 2014-02-02 NOTE — Telephone Encounter (Signed)
Given age of 80 y.o., Scr of 1.8, and weight of 101 kg, it is appropriate to use Eliquis 2.5 mg bid.

## 2014-02-02 NOTE — Telephone Encounter (Signed)
Dr. Irish Lack prefers Eliquis 2.5 mg 1 tablet BID. Lm for pt to return call.

## 2014-02-02 NOTE — Telephone Encounter (Signed)
Spoke with Porfirio Oar, Pharm D and pt can either take Eliquis 5 mg 1 tab BID or Xarelto 15 mg qd.

## 2014-02-03 ENCOUNTER — Telehealth: Payer: Self-pay | Admitting: Interventional Cardiology

## 2014-02-03 MED ORDER — APIXABAN 2.5 MG PO TABS: 2.5000 mg | ORAL_TABLET | Freq: Two times a day (BID) | ORAL | Status: DC

## 2014-02-03 NOTE — Telephone Encounter (Signed)
See other telephone note.  

## 2014-02-03 NOTE — Telephone Encounter (Signed)
New problem ° ° °Pt returning your call. °

## 2014-02-03 NOTE — Telephone Encounter (Signed)
Call Documentation     Pauline Good at 02/03/2014 8:08 AM     Status: Signed        New problem]  Pt returning your call

## 2014-02-03 NOTE — Telephone Encounter (Signed)
Pt notified. Eliquis 2.5 mg 1 tab BID sent into pharmacy. Pt will stop Aspirin 325 mg. She will be scheduled to see Pharmacist in 1 month for med mangement and labs will be drawn that day.

## 2014-02-04 ENCOUNTER — Ambulatory Visit (HOSPITAL_COMMUNITY): Payer: Medicare Other | Attending: Interventional Cardiology | Admitting: Radiology

## 2014-02-04 DIAGNOSIS — R609 Edema, unspecified: Secondary | ICD-10-CM

## 2014-02-04 DIAGNOSIS — R0609 Other forms of dyspnea: Secondary | ICD-10-CM | POA: Insufficient documentation

## 2014-02-04 DIAGNOSIS — I480 Paroxysmal atrial fibrillation: Secondary | ICD-10-CM

## 2014-02-04 DIAGNOSIS — R0602 Shortness of breath: Secondary | ICD-10-CM | POA: Diagnosis not present

## 2014-02-04 DIAGNOSIS — R0989 Other specified symptoms and signs involving the circulatory and respiratory systems: Secondary | ICD-10-CM | POA: Diagnosis not present

## 2014-02-04 DIAGNOSIS — R600 Localized edema: Secondary | ICD-10-CM

## 2014-02-04 DIAGNOSIS — I10 Essential (primary) hypertension: Secondary | ICD-10-CM | POA: Insufficient documentation

## 2014-02-04 DIAGNOSIS — I4891 Unspecified atrial fibrillation: Secondary | ICD-10-CM

## 2014-02-04 NOTE — Progress Notes (Signed)
Echocardiogram performed.  

## 2014-02-08 ENCOUNTER — Telehealth: Payer: Self-pay | Admitting: Interventional Cardiology

## 2014-02-08 DIAGNOSIS — I480 Paroxysmal atrial fibrillation: Secondary | ICD-10-CM

## 2014-02-08 NOTE — Telephone Encounter (Signed)
New message      Want echo results, and should she continue taking aspirin daily with new medication?

## 2014-02-09 MED ORDER — METOPROLOL SUCCINATE ER 25 MG PO TB24: 25.0000 mg | ORAL_TABLET | Freq: Every morning | ORAL | Status: DC

## 2014-02-09 MED ORDER — METOPROLOL SUCCINATE ER 50 MG PO TB24: 50.0000 mg | ORAL_TABLET | Freq: Every morning | ORAL | Status: DC

## 2014-02-09 NOTE — Addendum Note (Signed)
Addended byUlla Potash H on: 02/09/2014 09:16 AM   Modules accepted: Orders, Medications

## 2014-02-09 NOTE — Telephone Encounter (Signed)
Called pt to let her know to stop Aspirin and gave echo results.

## 2014-02-09 NOTE — Telephone Encounter (Signed)
Pt states she is willing to wear heart monitor if Dr. Irish Lack thinks monitor is needed. Pt c/o of intermittent dizziness everyday that will last for 2-3 hours. It is worse when she goes from sitting to standing. BP usually runs [id number] systolic, but she has had occurences of BP dropping down to 60 systolic ( about 2 times a week). Pt will continue to monitor BP and HR everyday.

## 2014-02-09 NOTE — Telephone Encounter (Signed)
Pt notified. Holter ordered. Pcc to schedule.

## 2014-02-09 NOTE — Addendum Note (Signed)
Addended byUlla Potash H on: 02/09/2014 09:01 AM   Modules accepted: Orders

## 2014-02-09 NOTE — Telephone Encounter (Signed)
Per Dr. Irish Lack order 48 hour holter and have pt decrease Metoprolol back to 25 mg qd.

## 2014-02-10 ENCOUNTER — Encounter (INDEPENDENT_AMBULATORY_CARE_PROVIDER_SITE_OTHER): Payer: Medicare Other

## 2014-02-10 ENCOUNTER — Encounter: Payer: Self-pay | Admitting: *Deleted

## 2014-02-10 DIAGNOSIS — I480 Paroxysmal atrial fibrillation: Secondary | ICD-10-CM | POA: Diagnosis not present

## 2014-02-10 NOTE — Progress Notes (Signed)
Patient ID: Heather Benson, female   DOB: [birth year], 80 y.o.   MRN: [id number] [employer] 48 hour holter monitor applied to patient.

## 2014-02-11 NOTE — Telephone Encounter (Signed)
Pt.notified

## 2014-02-11 NOTE — Telephone Encounter (Signed)
New message '     Patient having nose surgery on 10/5 . On eliquis, should she cancel her procedure or continue. Please advise.

## 2014-02-11 NOTE — Telephone Encounter (Signed)
OK to hold Eliquis 2 days prior to surgery and it will be out of her system, if surgeon wants medicine held.  If surgeon ok doing surgery on Eliquis, that is ok with me.

## 2014-02-11 NOTE — Telephone Encounter (Signed)
To Dr. Beau Fanny, please advise.

## 2014-02-14 DIAGNOSIS — Z85828 Personal history of other malignant neoplasm of skin: Secondary | ICD-10-CM | POA: Diagnosis not present

## 2014-02-14 DIAGNOSIS — C44311 Basal cell carcinoma of skin of nose: Secondary | ICD-10-CM | POA: Diagnosis not present

## 2014-02-18 ENCOUNTER — Telehealth: Payer: Self-pay | Admitting: Cardiology

## 2014-02-18 NOTE — Telephone Encounter (Signed)
Dr. Hassell Done Interpretation of 48 Hour Holter: Intermittent AFib. Controlled HR. Continue current medications.

## 2014-02-18 NOTE — Telephone Encounter (Signed)
Pt.notified

## 2014-02-18 NOTE — Telephone Encounter (Signed)
Stop metoprolol and see what BP does.

## 2014-02-18 NOTE — Telephone Encounter (Signed)
Pt states her BP has been running 110's-90's/50's but mostly readings are in the [id number] systolic. Pt c/o of dizziness that occurs it the mornings, which is when she takes her BP (usually doesn't take BP in the pm).

## 2014-02-21 NOTE — Telephone Encounter (Signed)
Pt notified and meds updated. Pt will stop metoprolol and call back if symptoms persist.

## 2014-02-21 NOTE — Addendum Note (Signed)
Addended byUlla Potash H on: 02/21/2014 08:51 AM   Modules accepted: Orders, Medications

## 2014-02-23 DIAGNOSIS — Z1231 Encounter for screening mammogram for malignant neoplasm of breast: Secondary | ICD-10-CM | POA: Diagnosis not present

## 2014-03-04 ENCOUNTER — Ambulatory Visit (INDEPENDENT_AMBULATORY_CARE_PROVIDER_SITE_OTHER): Payer: Medicare Other | Admitting: *Deleted

## 2014-03-04 DIAGNOSIS — Z23 Encounter for immunization: Secondary | ICD-10-CM | POA: Diagnosis not present

## 2014-03-04 DIAGNOSIS — I4891 Unspecified atrial fibrillation: Secondary | ICD-10-CM | POA: Diagnosis not present

## 2014-03-04 DIAGNOSIS — Z5181 Encounter for therapeutic drug level monitoring: Secondary | ICD-10-CM | POA: Diagnosis not present

## 2014-03-04 LAB — CBC
HCT: 36.5 % (ref 36.0–46.0)
Hemoglobin: 11.6 g/dL — ABNORMAL LOW (ref 12.0–15.0)
MCHC: 31.8 g/dL (ref 30.0–36.0)
MCV: 80.5 fl (ref 78.0–100.0)
Platelets: 234 10*3/uL (ref 150.0–400.0)
RBC: 4.53 Mil/uL (ref 3.87–5.11)
RDW: 17.3 % — ABNORMAL HIGH (ref 11.5–15.5)
WBC: 8.3 10*3/uL (ref 4.0–10.5)

## 2014-03-04 LAB — BASIC METABOLIC PANEL
BUN: 36 mg/dL — ABNORMAL HIGH (ref 6–23)
CHLORIDE: 106 meq/L (ref 96–112)
CO2: 24 meq/L (ref 19–32)
Calcium: 9.5 mg/dL (ref 8.4–10.5)
Creatinine, Ser: 1.8 mg/dL — ABNORMAL HIGH (ref 0.4–1.2)
GFR: 28.94 mL/min — ABNORMAL LOW (ref >60.00)
GLUCOSE: 96 mg/dL (ref 70–99)
POTASSIUM: 3.7 meq/L (ref 3.5–5.1)
SODIUM: 142 meq/L (ref 135–145)

## 2014-03-04 NOTE — Progress Notes (Signed)
Pt was started on Eliquis 2.5mg  bid   for  Atrial Fib on September 24,2015.    Reviewed patients medication list.  Pt is not currently on any combined P-gp and strong CYP3A4 inhibitors/inducers (ketoconazole, traconazole, ritonavir, carbamazepine, phenytoin, rifampin, St. John's wort).  Reviewed labs.  SCr 1.8 , Weight 101.152kg.  Dose is appropriate  based on labs.   Hgb 11.6 and HCT 36.5  A full discussion of the nature of anticoagulants has been carried out.  A benefit/risk analysis has been presented to the patient, so that they understand the justification for choosing anticoagulation with Eliquis at this time.  The need for compliance is stressed.  Pt is aware to take the medication twice daily.  Side effects of potential bleeding are discussed, including unusual colored urine or stools, coughing up blood or coffee ground emesis, nose bleeds or serious fall or head trauma.  Discussed signs and symptoms of stroke. The patient should avoid any OTC items containing aspirin or ibuprofen.  Avoid alcohol consumption.   Call if any signs of abnormal bleeding.  Discussed financial obligations and states is not having  any difficulty in obtaining medication.  Next lab test test in 6 months.  Pt states has had no sign or symptom of bleeding or stroke just has had dizziness. Pt states she restarted Metoprolol only after 2days of holding this  medication due to feeling as though heart rate was up will sent note in basket to Dr Irish Lack . Called pt and placed on her voice mail will talk with her on Monday October 26th 03/07/2014 Called pt and gave lab results and informed to continue on Eliquis 2.5mg  bid  and made appt for her to be rechecked in 6 months and instructed pt to call Dr Irish Lack and talk with him regarding her Metoprolol and she states understanding.

## 2014-03-08 ENCOUNTER — Telehealth: Payer: Self-pay | Admitting: Interventional Cardiology

## 2014-03-08 NOTE — Telephone Encounter (Signed)
New message     Pt cannot remember if she took her eliquis this am.  Should she take another pill just in case she did not take one earlier?

## 2014-03-08 NOTE — Telephone Encounter (Signed)
I spoke with the patient and advised her if she cannot recall if she took her am dose of eliquis, to not take extra now, but to take her pm dose tonight. She did take her eliquis last night.

## 2014-03-09 DIAGNOSIS — L03126 Acute lymphangitis of left lower limb: Secondary | ICD-10-CM | POA: Diagnosis not present

## 2014-03-09 DIAGNOSIS — Z85828 Personal history of other malignant neoplasm of skin: Secondary | ICD-10-CM | POA: Diagnosis not present

## 2014-03-09 DIAGNOSIS — L0100 Impetigo, unspecified: Secondary | ICD-10-CM | POA: Diagnosis not present

## 2014-03-10 DIAGNOSIS — L03116 Cellulitis of left lower limb: Secondary | ICD-10-CM | POA: Diagnosis not present

## 2014-03-10 NOTE — Progress Notes (Signed)
Left message to call back re: lab results.

## 2014-03-10 NOTE — Patient Instructions (Signed)
Patient informed of labs and recommendation to continue Eliquis, as directed.

## 2014-03-10 NOTE — Progress Notes (Signed)
Patient informed. Will continue Eliquis at current dose.

## 2014-03-17 ENCOUNTER — Telehealth: Payer: Self-pay | Admitting: Interventional Cardiology

## 2014-03-17 NOTE — Telephone Encounter (Signed)
New message      Pt said she has been dizzy for the last 4 days.  Could this be from the eliquis?

## 2014-03-17 NOTE — Telephone Encounter (Signed)
I spoke with the patient. She states she left for the beach on Sunday.  Monday she developed dizziness and a headache. The most recent change in medications is starting Eliquis 2.5 mg BID about a month ago. She has not felt her heart racing, but does think it is irregular. She has felt close to blacking out, but mostly it is hard for her to walk due to her dizziness. She reports taking tylenol regularly since Monday (11/2) for her headache. She came home from her beach trip early today because she was not feeling well. She did check her BP/ HR when she got home and this was 139/71 & 69. I advised her I would review with Dr. Irish Lack and call her back.Alvis Lemmings, RN, BSN  I reviewed the patient's symptoms with Dr. Irish Lack and Elberta Leatherwood, Pharm D. They are in agreement that symptoms should not be coming from Eliquis. I have instructed the patient to follow up with her PCP and confirm she does not have vertigo or an inner ear issue. She voices understanding. Alvis Lemmings, RN, BSN

## 2014-03-18 DIAGNOSIS — I129 Hypertensive chronic kidney disease with stage 1 through stage 4 chronic kidney disease, or unspecified chronic kidney disease: Secondary | ICD-10-CM | POA: Diagnosis not present

## 2014-03-18 DIAGNOSIS — J309 Allergic rhinitis, unspecified: Secondary | ICD-10-CM | POA: Diagnosis not present

## 2014-03-18 DIAGNOSIS — R42 Dizziness and giddiness: Secondary | ICD-10-CM | POA: Diagnosis not present

## 2014-03-30 DIAGNOSIS — R42 Dizziness and giddiness: Secondary | ICD-10-CM | POA: Diagnosis not present

## 2014-03-30 DIAGNOSIS — N184 Chronic kidney disease, stage 4 (severe): Secondary | ICD-10-CM | POA: Diagnosis not present

## 2014-03-30 DIAGNOSIS — I10 Essential (primary) hypertension: Secondary | ICD-10-CM | POA: Diagnosis not present

## 2014-03-30 DIAGNOSIS — E785 Hyperlipidemia, unspecified: Secondary | ICD-10-CM | POA: Diagnosis not present

## 2014-04-01 DIAGNOSIS — E785 Hyperlipidemia, unspecified: Secondary | ICD-10-CM | POA: Diagnosis not present

## 2014-04-01 DIAGNOSIS — N184 Chronic kidney disease, stage 4 (severe): Secondary | ICD-10-CM | POA: Diagnosis not present

## 2014-04-01 DIAGNOSIS — R5383 Other fatigue: Secondary | ICD-10-CM | POA: Diagnosis not present

## 2014-04-01 DIAGNOSIS — H8110 Benign paroxysmal vertigo, unspecified ear: Secondary | ICD-10-CM | POA: Diagnosis not present

## 2014-04-01 DIAGNOSIS — Z23 Encounter for immunization: Secondary | ICD-10-CM | POA: Diagnosis not present

## 2014-04-01 DIAGNOSIS — I129 Hypertensive chronic kidney disease with stage 1 through stage 4 chronic kidney disease, or unspecified chronic kidney disease: Secondary | ICD-10-CM | POA: Diagnosis not present

## 2014-04-12 DIAGNOSIS — Z96651 Presence of right artificial knee joint: Secondary | ICD-10-CM | POA: Diagnosis not present

## 2014-04-12 DIAGNOSIS — M1712 Unilateral primary osteoarthritis, left knee: Secondary | ICD-10-CM | POA: Diagnosis not present

## 2014-04-27 DIAGNOSIS — H26493 Other secondary cataract, bilateral: Secondary | ICD-10-CM | POA: Diagnosis not present

## 2014-04-27 DIAGNOSIS — H524 Presbyopia: Secondary | ICD-10-CM | POA: Diagnosis not present

## 2014-04-27 DIAGNOSIS — H40023 Open angle with borderline findings, high risk, bilateral: Secondary | ICD-10-CM | POA: Diagnosis not present

## 2014-05-09 ENCOUNTER — Ambulatory Visit: Payer: Medicare Other | Attending: Family Medicine | Admitting: Physical Therapy

## 2014-05-09 ENCOUNTER — Encounter: Payer: Self-pay | Admitting: Physical Therapy

## 2014-05-09 DIAGNOSIS — R42 Dizziness and giddiness: Secondary | ICD-10-CM | POA: Insufficient documentation

## 2014-05-09 DIAGNOSIS — H8111 Benign paroxysmal vertigo, right ear: Secondary | ICD-10-CM | POA: Insufficient documentation

## 2014-05-09 NOTE — Therapy (Signed)
[facility] [address] [place], [place], [address] Phone: [phone]   Fax:  [phone]  Physical Therapy Evaluation  Patient Details  Name: Heather Benson MRN: [id number] Date of Birth: June 21, 1931  Encounter Date: 05/09/2014      PT End of Session - 05/09/14 2011    Visit Number 1  G1   Number of Visits 4   Date for PT Re-Evaluation 06/09/14   Authorization Type Medicare   Authorization Time Period 05-09-14 - 07-10-14   PT Start Time 1402   PT Stop Time 1448   PT Time Calculation (min) 46 min      Past Medical History  Diagnosis Date  . Complication of anesthesia 08-28-11    in past arrythmia-  cardiology has seen in past  . Hypertension 08-28-11    tx. meds  . Swelling of extremity 08-28-11    bilateral lower extremities- mid calf down  . GERD (gastroesophageal reflux disease) 08-28-11    tx. omeprazole  . Cancer 08-28-11    Melonoma-cheek(many Yrs ago) , recent bx. left  face lesion, past skin cancer lesions  . Arthritis 08-28-11    right knee pain-surgery planned  . Neuromuscular disorder 08-28-11    hx. Polio age 89-slight residual affects legs  . Dysrhythmia 08-28-11    only immed.  to several days after surgery- hx. A.fib. in past    Past Surgical History  Procedure Laterality Date  . Cataract extraction, bilateral  08-28-11    bilateral  . Abdominal hysterectomy  08-28-11    Partial-vaginal approach  . Bunionectomy  08-28-11    right  . Knee arthroscopy  09/06/2011    Procedure: ARTHROSCOPY KNEE;  Surgeon: Gearlean Alf, MD;  Location: [facility];  Service: Orthopedics;  Laterality: Right;  with Synovectomy  . Joint replacement  Oct. 1, 2012    Right Knee  . Eye surgery      Cataract    There were no vitals taken for this visit.  Visit Diagnosis:  BPPV (benign paroxysmal positional vertigo), right - Plan: PT plan of care cert/re-cert  Dizziness and giddiness - Plan: PT plan of care  cert/re-cert      Subjective Assessment - 05/09/14 1349    Symptoms Pt. reports symptoms started when looking down while singing in choir - onset approx. 2 months ago   Pertinent History polio with leg weakness; arrythmia; osteoporosis; s/p TKR   Currently in Pain? No/denies          [facility] PT Assessment - 05/09/14 1415    Assessment   Medical Diagnosis BPPV   Onset Date --  approx. 2 months ago   Balance Screen   Has the patient fallen in the past 6 months No   Has the patient had a decrease in activity level because of a fear of falling?  No   Is the patient reluctant to leave their home because of a fear of falling?  No   Prior Function   Level of Independence Independent with homemaking with ambulation            Vestibular Assessment - 05/09/14 1434    Type of Dizziness "Funny feeling in head"   Aggravating Factors --  looking down   Relieving Factors Lying supine   Smooth Pursuits Intact   Saccades Intact   Supine to Left Side Lightheadedness   Supine to Right Side Mild dizziness   Up from Right Hallpike Moderate dizziness   Up  from Left Hallpike Moderate dizziness   BP sitting 114/93 mmHg   BP standing (after 1 minute) 110/78 mmHg   Orthostatics Comment BP 134/89 seated and 120/78 in standing on 2nd recording for comparison with initial readings     Treatment:  Pt. Received canalith repositioning maneuver (Epley's maneuver) x 3 reps for R BPPV; pt. Did report improvement in symptoms on 3rd except with return to upright from left sidelying position of Epley's maneuver. BP readings taken with c/o no improvement in dizziness/light-headedness on 3rd rep; will continue to assess orthostatics next session if Brandt-Daroff exercises do not fully resolve vertigo.  BP readings significantly fluctuate - please refer to above readings; pt. States vertigo worsens when she is statically standing, i.e. when singing in church choir                 PT Education -  May 14, 2014 August 09, 2008    Education provided Yes   Education Details Laruth Bouchard- Daroff exercises   Person(s) Educated Patient   Methods Explanation;Demonstration;Handout   Comprehension Verbalized understanding;Returned demonstration          PT Short Term Goals - May 14, 2014 2016/08/09    PT SHORT TERM GOAL #1   Title same as LTG's           PT Long Term Goals - 14-May-2014 08/09/16    PT LONG TERM GOAL #1   Title Pt. will report no vertigo with bed mobility   Baseline 06-09-14   Time 4   Period Weeks   Status New   PT LONG TERM GOAL #2   Title Report at least 50% improvement in vertigo   Baseline 06-09-14   Time 4   Period Weeks   Status New   PT LONG TERM GOAL #3   Title Independent in Brandt-Daroff exercises prn   Baseline 06-09-14   Time 4   Period Weeks   Status New   PT LONG TERM GOAL #4   Title Assess for orthostatic hypotension; pt. will verbalize understanding   Baseline 06-09-14   Time 4   Period Weeks   Status New               Plan - 05/14/2014 08/10/2011    Clinical Impression Statement pt. has symptoms consistent with R BPPV but no nystagmus observed; pt was treated with Epley's maneuver x 3 reps and reported improvement on 3rd rep but continues to c/o dizziness with return to upright position; BP checked and pt has symptoms consistent with orthostaic hypotension       Pt will benefit from skilled therapeutic intervention in order to improve on the following deficits --  vertigo/dizziness   Rehab Potential Good   PT Frequency 1x / week  evaluation +   PT Duration 4 weeks   PT Treatment/Interventions Other (comment);ADLs/Self Care Home Management;Patient/family education;Neuromuscular re-education;Balance training;Therapeutic exercise  Epley's maneuver; vestibular rehab   PT Next Visit Plan check orthostatics; CRM for BPPV (R) prn   PT Home Exercise Plan Brandt-Daroff exercises   Consulted and Agree with Plan of Care Patient          G-Codes - 05/14/2014 2022/08/10     Functional Assessment Tool Used --  clinical judgment   Functional Limitation Other PT primary   Other PT Primary Current Status [id number]) At least 40 percent but less than 60 percent impaired, limited or restricted   Other PT Primary Goal Status [id number]) At least 20 percent but less than 40 percent impaired, limited or restricted  Problem List Patient Active Problem List   Diagnosis Date Noted  . Atrial fibrillation 01/27/2014  . Chronic kidney disease, unspecified 01/27/2014  . Swelling of limb 04/01/2012  . Edema leg 04/01/2012    Alda Lea, PT 05/09/2014, 8:30 PM  [facility] [address] [place], [place], [address] Phone: [phone]   Fax:  [phone]

## 2014-05-09 NOTE — Patient Instructions (Addendum)
Benign Positional Vertigo Vertigo means you feel like you or your surroundings are moving when they are not. Benign positional vertigo is the most common form of vertigo. Benign means that the cause of your condition is not serious. Benign positional vertigo is more common in older adults. CAUSES  Benign positional vertigo is the result of an upset in the labyrinth system. This is an area in the middle ear that helps control your balance. This may be caused by a viral infection, head injury, or repetitive motion. However, often no specific cause is found. SYMPTOMS  Symptoms of benign positional vertigo occur when you move your head or eyes in different directions. Some of the symptoms may include:  Loss of balance and falls.  Vomiting.  Blurred vision.  Dizziness.  Nausea.  Involuntary eye movements (nystagmus). DIAGNOSIS  Benign positional vertigo is usually diagnosed by physical exam. If the specific cause of your benign positional vertigo is unknown, your caregiver may perform imaging tests, such as magnetic resonance imaging (MRI) or computed tomography (CT). TREATMENT  Your caregiver may recommend movements or procedures to correct the benign positional vertigo. Medicines such as meclizine, benzodiazepines, and medicines for nausea may be used to treat your symptoms. In rare cases, if your symptoms are caused by certain conditions that affect the inner ear, you may need surgery. HOME CARE INSTRUCTIONS   Follow your caregiver's instructions.  Move slowly. Do not make sudden body or head movements.  Avoid driving.  Avoid operating heavy machinery.  Avoid performing any tasks that would be dangerous to you or others during a vertigo episode.  Drink enough fluids to keep your urine clear or pale yellow. SEEK IMMEDIATE MEDICAL CARE IF:   You develop problems with walking, weakness, numbness, or using your arms, hands, or legs.  You have difficulty speaking.  You develop  severe headaches.  Your nausea or vomiting continues or gets worse.  You develop visual changes.  Your family or friends notice any behavioral changes.  Your condition gets worse.  You have a fever.  You develop a stiff neck or sensitivity to light. MAKE SURE YOU:   Understand these instructions.  Will watch your condition.  Will get help right away if you are not doing well or get worse. Document Released: 02/04/2006 Document Revised: 07/22/2011 Document Reviewed: 01/17/2011 [facility] Patient Information 2015 [place], [place]. This information is not intended to replace advice given to you by your health care provider. Make sure you discuss any questions you have with your health care provider.  Brandt-Daroff exercises Laruth Bouchard- Daroff exercises

## 2014-05-16 ENCOUNTER — Encounter: Payer: Self-pay | Admitting: Physical Therapy

## 2014-05-16 ENCOUNTER — Ambulatory Visit: Payer: Medicare Other | Attending: Family Medicine | Admitting: Physical Therapy

## 2014-05-16 DIAGNOSIS — H8111 Benign paroxysmal vertigo, right ear: Secondary | ICD-10-CM | POA: Insufficient documentation

## 2014-05-16 DIAGNOSIS — R42 Dizziness and giddiness: Secondary | ICD-10-CM | POA: Diagnosis not present

## 2014-05-16 NOTE — Therapy (Signed)
[facility] [address] [place], [place], [address] Phone: [phone]   Fax:  [phone]  Physical Therapy Treatment  Patient Details  Name: Heather Benson MRN: [id number] Date of Birth: 06-Nov-1931  Encounter Date: 05/16/2014 PHYSICAL THERAPY DISCHARGE SUMMARY  Visits from Start of Care: 2  Current functional level related to goals / functional outcomes:   PT Goals         05/09/14 2018    PT SHORT TERM GOAL #1    Title  same as LTG's    PT LONG TERM GOAL #1    Title  Pt. will report no vertigo with bed mobility    Baseline  06-09-14    Time  4    Period  Weeks    Status  New    PT LONG TERM GOAL #2    Title  Report at least 50% improvement in vertigo    Baseline  06-09-14    Time  4    Period  Weeks    Status  New    PT LONG TERM GOAL #3    Title  Independent in Brandt-Daroff exercises prn    Baseline  06-09-14    Time  4    Period  Weeks    Status  New    PT LONG TERM GOAL #4    Title  Assess for orthostatic hypotension; pt. will verbalize understanding    Baseline  06-09-14    Time  4    Period  Weeks    Status  New        05/16/14 2039    PT LONG TERM GOAL #1    Title  Pt. will report no vertigo with bed mobility    Baseline  06-09-14    Status  Achieved    PT LONG TERM GOAL #2    Title  Report at least 50% improvement in vertigo    Status  Not Met    PT LONG TERM GOAL #3    Title  Independent in Brandt-Daroff exercises prn    Status  Revised      exercises discontinued due to pt. reporting not helping to decr. vertigo; light-headedness persists with supine to siit    PT LONG TERM GOAL #4    Title  Assess for orthostatic hypotension; pt. will verbalize understanding    Status  Achieved         Remaining deficits: Continued c/o vertigo/light-headedness with sit to stand and with supine to sit and  with prolonged standing  Education / Equipment: Pt. Was initially instructed in [place] exercises but states exercises do not help to reduce the vertigo/light-headedness Plan: Patient agrees to discharge.  Patient goals were partially met. Patient is being discharged due to lack of progress.  ?????Pt.'s vertigo appears to be due to BP changes and not due to BPPV.         PT End of Session - 05/16/14 2034    Visit Number 2  G2   Date for PT Re-Evaluation 06/09/14   Authorization Type Medicare   PT Start Time 1102   PT Stop Time 1140   PT Time Calculation (min) 38 min      Past Medical History  Diagnosis Date  . Complication of anesthesia 08-28-11    in past arrythmia-  cardiology has seen in past  . Hypertension 08-28-11    tx. meds  . Swelling of extremity 08-28-11    bilateral lower  extremities- mid calf down  . GERD (gastroesophageal reflux disease) 08-28-11    tx. omeprazole  . Cancer 08-28-11    Melonoma-cheek(many Yrs ago) , recent bx. left  face lesion, past skin cancer lesions  . Arthritis 08-28-11    right knee pain-surgery planned  . Neuromuscular disorder 08-28-11    hx. Polio age 12-slight residual affects legs  . Dysrhythmia 08-28-11    only immed.  to several days after surgery- hx. A.fib. in past    Past Surgical History  Procedure Laterality Date  . Cataract extraction, bilateral  08-28-11    bilateral  . Abdominal hysterectomy  08-28-11    Partial-vaginal approach  . Bunionectomy  08-28-11    right  . Knee arthroscopy  09/06/2011    Procedure: ARTHROSCOPY KNEE;  Surgeon: Gearlean Alf, MD;  Location: [facility];  Service: Orthopedics;  Laterality: Right;  with Synovectomy  . Joint replacement  Oct. 1, 2012    Right Knee  . Eye surgery      Cataract    There were no vitals taken for this visit.  Visit Diagnosis:  BPPV (benign paroxysmal positional vertigo), right  Dizziness and giddiness      Subjective Assessment - 05/16/14 2030    Symptoms  Pt. reports no change in the dizziness; did the Brandt-Daroff exercises some but did not help to reduce the vertigo   Pertinent History polio with leg weakness; arrythmia; osteoporosis; s/p TKR   Currently in Pain? No/denies              Vestibular Assessment - 05/16/14 2031    Dix-Hallpike Right Symptoms No nystagmus  c/o light-headedness   BP supine (x 5 minutes) 114/86 mmHg   HR supine (x 5 minutes) 97   BP sitting 103/70 mmHg   HR sitting 91   BP standing (after 1 minute) 104/81 mmHg   HR standing (after 1 minute) 81   BP standing (after 3 minutes) 114/87 mmHg   HR standing (after 3 minutes) 100   Orthostatics Comment pt. reports vertigo with transferring from supine to sit and with sit to stand;  pt. also states that the dizziness increases with standing ( pt reported more vertigo standing for 3" when checking orthostatics)    Pt.'s symptoms appear to be more consistent with orthostatic hypotension as pt. Denies vertigo with any bed mobility; pt.  States vertigo increases with transitional changes such as supine to sit, sit to stand, and prolonged standing ,i.e. Reported   vertigo increased with standing 3"  Dix-Hallpike test negative for nystagmus, however, pt. Did c/o dizziness in test position but this was attributed to changes in circulation In test position as not described as true spinning vertigo; pt. Reported incr. Vertigo/dizziness with return to upright position from  test position                  PT Short Term Goals - 05/09/14 2018    PT SHORT TERM GOAL #1   Title same as LTG's           PT Long Term Goals - 05/16/14 2039    PT LONG TERM GOAL #1   Title Pt. will report no vertigo with bed mobility   Baseline 06-09-14   Status Achieved   PT LONG TERM GOAL #2   Title Report at least 50% improvement in vertigo   Status Not Met   PT LONG TERM GOAL #3   Title Independent in Brandt-Daroff exercises prn  Status Revised  exercises  discontinued  due to pt. reporting not helping to decr. vertigo;  light-headedness persists with supine to siit   PT LONG TERM GOAL #4   Title Assess for orthostatic hypotension; pt. will verbalize understanding   Status Achieved               Plan - 08-Jun-2014 07-14-2033    Clinical Impression Statement Pt. has no nystagmus with any positional testing but does exhibit fluctuations in BP (orthostatic hypostenison) and fluctuations in heart rate, possibly due to arrythmia per pt. report;  pt.'s vertigo appears to be attribuatedd to orthostatic hypotension and does not appear to be BPPV   PT Next Visit Plan D/C due to symptoms consistent with orthostatic hypotension or cardiac arrythmia   PT Home Exercise Plan D/C   Consulted and Agree with Plan of Care Patient          G-Codes - June 08, 2014 2040-07-14    Functional Assessment Tool Used clinical judgment   Functional Limitation Other PT primary   Other PT Primary Goal Status (A2320) At least 20 percent but less than 40 percent impaired, limited or restricted   Other PT Primary Discharge Status [phone]) At least 40 percent but less than 60 percent impaired, limited or restricted      Problem List Patient Active Problem List   Diagnosis Date Noted  . Atrial fibrillation 01/27/2014  . Chronic kidney disease, unspecified 01/27/2014  . Swelling of limb 04/01/2012  . Edema leg 04/01/2012    Alda Lea, PT 06-08-2014, 8:44 PM  [facility] [address] [place], [place], [address] Phone: [phone]   Fax:  [phone]

## 2014-05-17 ENCOUNTER — Ambulatory Visit: Payer: Medicare Other | Admitting: Physical Therapy

## 2014-05-18 DIAGNOSIS — R609 Edema, unspecified: Secondary | ICD-10-CM | POA: Diagnosis not present

## 2014-05-18 DIAGNOSIS — R42 Dizziness and giddiness: Secondary | ICD-10-CM | POA: Diagnosis not present

## 2014-05-18 DIAGNOSIS — I480 Paroxysmal atrial fibrillation: Secondary | ICD-10-CM | POA: Diagnosis not present

## 2014-05-26 DIAGNOSIS — M1712 Unilateral primary osteoarthritis, left knee: Secondary | ICD-10-CM | POA: Diagnosis not present

## 2014-06-02 DIAGNOSIS — M1712 Unilateral primary osteoarthritis, left knee: Secondary | ICD-10-CM | POA: Diagnosis not present

## 2014-06-08 DIAGNOSIS — M1712 Unilateral primary osteoarthritis, left knee: Secondary | ICD-10-CM | POA: Diagnosis not present

## 2014-06-20 ENCOUNTER — Ambulatory Visit: Payer: Medicare Other | Attending: Family Medicine | Admitting: Physical Therapy

## 2014-06-20 DIAGNOSIS — R42 Dizziness and giddiness: Secondary | ICD-10-CM | POA: Insufficient documentation

## 2014-06-20 DIAGNOSIS — H8111 Benign paroxysmal vertigo, right ear: Secondary | ICD-10-CM | POA: Insufficient documentation

## 2014-06-20 DIAGNOSIS — M1712 Unilateral primary osteoarthritis, left knee: Secondary | ICD-10-CM | POA: Diagnosis not present

## 2014-06-22 ENCOUNTER — Encounter: Payer: Self-pay | Admitting: Interventional Cardiology

## 2014-06-22 ENCOUNTER — Ambulatory Visit (INDEPENDENT_AMBULATORY_CARE_PROVIDER_SITE_OTHER): Payer: Medicare Other | Admitting: Interventional Cardiology

## 2014-06-22 VITALS — BP 135/89 | HR 92 | Ht 65.5 in | Wt 216.0 lb

## 2014-06-22 DIAGNOSIS — N183 Chronic kidney disease, stage 3 unspecified: Secondary | ICD-10-CM

## 2014-06-22 DIAGNOSIS — I4819 Other persistent atrial fibrillation: Secondary | ICD-10-CM

## 2014-06-22 DIAGNOSIS — R600 Localized edema: Secondary | ICD-10-CM

## 2014-06-22 DIAGNOSIS — I481 Persistent atrial fibrillation: Secondary | ICD-10-CM

## 2014-06-22 DIAGNOSIS — R5382 Chronic fatigue, unspecified: Secondary | ICD-10-CM | POA: Diagnosis not present

## 2014-06-22 DIAGNOSIS — R5383 Other fatigue: Secondary | ICD-10-CM

## 2014-06-22 DIAGNOSIS — R531 Weakness: Secondary | ICD-10-CM | POA: Insufficient documentation

## 2014-06-22 MED ORDER — BISOPROLOL FUMARATE 5 MG PO TABS
5.0000 mg | ORAL_TABLET | Freq: Two times a day (BID) | ORAL | Status: DC
Start: 2014-06-22 — End: 2014-06-22

## 2014-06-22 MED ORDER — BISOPROLOL FUMARATE 5 MG PO TABS: 5.0000 mg | ORAL_TABLET | Freq: Two times a day (BID) | ORAL | Status: DC

## 2014-06-22 NOTE — Progress Notes (Signed)
Patient ID: Heather Benson, female   DOB: April 21, 1932, 81 y.o.   MRN: [id number]    [place], [place], [place]  [address] Phone: [phone] Fax:  [phone]  Date:  06/22/2014   ID:  Heather Benson, DOB Nov 29, 1931, MRN [id number]  PCP:  Vidal Schwalbe, MD      History of Present Illness: Heather Benson is a 81 y.o. female who has had PAF with surgery on two occasions several years ago. Atrial Fibrillation F/U: She thinks she has AFib several times a week.  Episodes last 30 minutes at a time.  She takes deep breaths and gets some relef.  Denies : Chest pain.  Syncope.   Reports: Dizziness- worse with standing up- feels better when she sits back down Leg edema. - treated with lasix-sliding scale based on edema  Palpitations. - worse at night. Not daily. Shortness of breath- worse with walking  BP at home are typically in the 120s/80 range.  In the past, she has had low BP with systolic around 123XX123, and had dizziness.  Of late, she has had dizziness while standing for long periods of time at church or while doing house work.  She will sit down with relief.  No syncope or falling.  No bleeding on the Eliquis.   She takes the Lasix daily.  If she does not take it, there will be severe swelling.  She sometimes has to take an extra 2-3x/week.     Wt Readings from Last 3 Encounters:  06/22/14 216 lb (97.977 kg)  01/27/14 223 lb (101.152 kg)  04/01/12 226 lb 14.4 oz (102.921 kg)     Past Medical History  Diagnosis Date  . Complication of anesthesia 08-28-11    in past arrythmia-  cardiology has seen in past  . Hypertension 08-28-11    tx. meds  . Swelling of extremity 08-28-11    bilateral lower extremities- mid calf down  . GERD (gastroesophageal reflux disease) 08-28-11    tx. omeprazole  . Cancer 08-28-11    Melonoma-cheek(many Yrs ago) , recent bx. left  face lesion, past skin cancer lesions  . Arthritis 08-28-11    right knee pain-surgery planned  .  Neuromuscular disorder 08-28-11    hx. Polio age 59-slight residual affects legs  . Dysrhythmia 08-28-11    only immed.  to several days after surgery- hx. A.fib. in past    Current Outpatient Prescriptions  Medication Sig Dispense Refill  . apixaban (ELIQUIS) 2.5 MG TABS tablet Take 1 tablet (2.5 mg total) by mouth 2 (two) times daily. 60 tablet 6  . Cholecalciferol (VITAMIN D3) 2000 UNITS capsule Take 2,000 Units by mouth daily.    . fexofenadine (ALLEGRA) 180 MG tablet Take 180 mg by mouth daily.    . fluticasone (FLONASE) 50 MCG/ACT nasal spray Place 1 spray into the nose daily as needed for allergies.     . furosemide (LASIX) 40 MG tablet Take 40 mg by mouth daily. Can take an additional tablet if swelling persists    . meclizine (ANTIVERT) 25 MG tablet   0  . omeprazole (PRILOSEC) 20 MG capsule Take 20 mg by mouth daily.    [name] OVER THE COUNTER MEDICATION Take 1 tablet by mouth daily. Move Free Ultra    . tetrahydrozoline 0.05 % ophthalmic solution Place 1 drop into both eyes 2 (two) times daily.     No current facility-administered medications for this visit.    Allergies:  Allergies  Allergen Reactions  . Codeine     "Spaced out feeling"    Social History:  The patient  reports that she has never smoked. She has never used smokeless tobacco. She reports that she does not drink alcohol or use illicit drugs.   Family History:  The patient's family history includes Cancer in her mother and sister; Heart disease in her father and mother; Hyperlipidemia in her mother; Hypertension in her father and mother.   ROS:  Please see the history of present illness.  No nausea, vomiting.  No fevers, chills.  No focal weakness.  No dysuria. Fatigue/SHOB.   All other systems reviewed and negative.   PHYSICAL EXAM: VS:  BP 135/89 mmHg  Pulse 92  Ht 5' 5.5" (1.664 m)  Wt 216 lb (97.977 kg)  BMI 35.38 kg/m2 General: Well developed, well nourished, in no acute distress HEENT: normal Neck:  no JVD, no carotid bruits Cardiac:  normal S1, S2; irregularly irregular;  Lungs:  clear to auscultation bilaterally, no wheezing, rhonchi or rales Abd: soft, nontender, no hepatomegaly Ext: bilateral leg edema Skin: warm and dry Neuro:   no focal abnormalities noted Psych: normal affect      ASSESSMENT AND PLAN:  Atrial fibrillation  Used Amiodarone HCl Tablet, 400 MG, 1 tablet, Orally, Once a day, 30 day(s), 30, Refills 11 in the past before surgery in 2013. Treated with prophylactic amiodarone to prevent AFib.  AFib in 7/15 ECG and today on exam. Palpitations are similar to the AFib. She is on low-dose Eliquis based on CrCl. No bleeding issues. Chronic renal insuficiency. Stopped aspirin with NOAC.  HTN: No low BP episodes. She needs to stay well hydrated. Hopefully, this will reduce orthostatic sx.  SHOB/fatigue: May be related to atrial fibrillation. Start bisoprolol 5 mg by mouth twice a day. She had some symptoms on Lopressor in the past. If she does not tolerate bisoprolol, would consider adding back amiodarone for rhythm control.   Edema: Lasix. Elevate legs at night as well. She does need to drink plenty of water to try to avoid dehydration. Would avoid calcium channel blocker as I do not want to worsen her edema.  Check HR in a few weeks.  Signed, Mina Marble, MD, [facility] 06/22/2014 9:32 AM

## 2014-06-22 NOTE — Patient Instructions (Signed)
Your physician has recommended you make the following change in your medication:  1)START taking Bisoprolol 5mg  twice daily  Your physician recommends that you schedule a follow-up appointment in: 2 weeks with Dr. Irish Lack or Flex provider.

## 2014-06-23 DIAGNOSIS — M1712 Unilateral primary osteoarthritis, left knee: Secondary | ICD-10-CM | POA: Diagnosis not present

## 2014-06-27 DIAGNOSIS — L820 Inflamed seborrheic keratosis: Secondary | ICD-10-CM | POA: Diagnosis not present

## 2014-06-27 DIAGNOSIS — L821 Other seborrheic keratosis: Secondary | ICD-10-CM | POA: Diagnosis not present

## 2014-06-27 DIAGNOSIS — L84 Corns and callosities: Secondary | ICD-10-CM | POA: Diagnosis not present

## 2014-06-27 DIAGNOSIS — D1801 Hemangioma of skin and subcutaneous tissue: Secondary | ICD-10-CM | POA: Diagnosis not present

## 2014-06-27 DIAGNOSIS — Z85828 Personal history of other malignant neoplasm of skin: Secondary | ICD-10-CM | POA: Diagnosis not present

## 2014-06-27 DIAGNOSIS — L570 Actinic keratosis: Secondary | ICD-10-CM | POA: Diagnosis not present

## 2014-06-28 DIAGNOSIS — M1712 Unilateral primary osteoarthritis, left knee: Secondary | ICD-10-CM | POA: Diagnosis not present

## 2014-06-29 ENCOUNTER — Encounter: Payer: Self-pay | Admitting: *Deleted

## 2014-07-04 ENCOUNTER — Ambulatory Visit (INDEPENDENT_AMBULATORY_CARE_PROVIDER_SITE_OTHER): Payer: Medicare Other | Admitting: Neurology

## 2014-07-04 ENCOUNTER — Encounter: Payer: Self-pay | Admitting: Neurology

## 2014-07-04 VITALS — BP 136/74 | HR 96 | Temp 98.4°F | Resp 16 | Ht 65.0 in | Wt 210.7 lb

## 2014-07-04 DIAGNOSIS — R42 Dizziness and giddiness: Secondary | ICD-10-CM

## 2014-07-04 DIAGNOSIS — I959 Hypotension, unspecified: Secondary | ICD-10-CM

## 2014-07-04 NOTE — Patient Instructions (Signed)
Based on our testing, your blood pressure does drop when you stand up.  Therefore, I think this is the cause of the dizziness.  I don't think it is crystals in the ears or anything neurologic in the brain.  Discuss with Dr. Dema Severin regarding steps to properly treat this.

## 2014-07-04 NOTE — Progress Notes (Signed)
NEUROLOGY CONSULTATION NOTE  Heather Benson MRN: [id number] DOB: 1932/05/04  Referring provider: Dr. Dema Severin Primary care provider: Dr. Dema Severin  Reason for consult:  dizziness  HISTORY OF PRESENT ILLNESS: Heather Benson is an 81 year old right-handed woman with paroxysmal atrial fibrillation, hypertension, arthritis and history of Polio who presents for dizziness.  Records reviewed.  She began feeling dizzy several months ago.  At first, reportedly it would occur when she exhibited low blood pressure readings with systolic less than 123XX123.  She describes a "funny feeling" in her head, a lightheadedness.  On a couple of occasions,she felt like she was going to pass out but never did.  It is not described as a spinning sensation.  It would occur when she would get up or while standing.  It would resolve usually when she would sit back down.  There is no associated nausea or diaphoresis.  She was sent for vestibular rehab.  The therapist thought it was related to orthostatic changes rather than BPPV.  Orthostatics were later checked by her PCP, which were negative.  PAST MEDICAL HISTORY: Past Medical History  Diagnosis Date  . Complication of anesthesia 08-28-11    in past arrythmia-  cardiology has seen in past  . Hypertension 08-28-11    tx. meds  . Swelling of extremity 08-28-11    bilateral lower extremities- mid calf down  . GERD (gastroesophageal reflux disease) 08-28-11    tx. omeprazole  . Cancer 08-28-11    Melonoma-cheek(many Yrs ago) , recent bx. left  face lesion, past skin cancer lesions  . Arthritis 08-28-11    right knee pain-surgery planned  . Neuromuscular disorder 08-28-11    hx. Polio age 37-slight residual affects legs  . Dysrhythmia 08-28-11    only immed.  to several days after surgery- hx. A.fib. in past    PAST SURGICAL HISTORY: Past Surgical History  Procedure Laterality Date  . Cataract extraction, bilateral  08-28-11    bilateral  . Abdominal hysterectomy   08-28-11    Partial-vaginal approach  . Bunionectomy  08-28-11    right  . Knee arthroscopy  09/06/2011    Procedure: ARTHROSCOPY KNEE;  Surgeon: Gearlean Alf, MD;  Location: [facility];  Service: Orthopedics;  Laterality: Right;  with Synovectomy  . Joint replacement  Oct. 1, 2012    Right Knee  . Eye surgery      Cataract    MEDICATIONS: Current Outpatient Prescriptions on File Prior to Visit  Medication Sig Dispense Refill  . apixaban (ELIQUIS) 2.5 MG TABS tablet Take 1 tablet (2.5 mg total) by mouth 2 (two) times daily. 60 tablet 6  . bisoprolol (ZEBETA) 5 MG tablet Take 1 tablet (5 mg total) by mouth 2 (two) times daily. 60 tablet 6  . Cholecalciferol (VITAMIN D3) 2000 UNITS capsule Take 2,000 Units by mouth daily.    . fexofenadine (ALLEGRA) 180 MG tablet Take 180 mg by mouth daily.    . fluticasone (FLONASE) 50 MCG/ACT nasal spray Place 1 spray into the nose daily as needed for allergies.     . furosemide (LASIX) 40 MG tablet Take 40 mg by mouth daily. Can take an additional tablet if swelling persists    . meclizine (ANTIVERT) 25 MG tablet   0  . omeprazole (PRILOSEC) 20 MG capsule Take 20 mg by mouth daily.    [name] OVER THE COUNTER MEDICATION Take 1 tablet by mouth daily. Move Free Ultra    . tetrahydrozoline 0.05 % ophthalmic  solution Place 1 drop into both eyes 2 (two) times daily.     No current facility-administered medications on file prior to visit.    ALLERGIES: Allergies  Allergen Reactions  . Codeine     "Spaced out feeling"    FAMILY HISTORY: Family History  Problem Relation Age of Onset  . Cancer Mother   . Heart disease Mother     Varicose Veins  . Hyperlipidemia Mother   . Hypertension Mother   . Heart disease Father     Heart Disease before age 75  . Hypertension Father   . Cancer Sister     SOCIAL HISTORY: History   Social History  . Marital Status: Married    Spouse Name: N/A  . Number of Children: N/A  . Years of Education: N/A    Occupational History  . Not on file.   Social History Main Topics  . Smoking status: Never Smoker   . Smokeless tobacco: Never Used  . Alcohol Use: No  . Drug Use: No  . Sexual Activity:    Partners: Male   Other Topics Concern  . Not on file   Social History Narrative    REVIEW OF SYSTEMS: Constitutional: No fevers, chills, or sweats, no generalized fatigue, change in appetite Eyes: No visual changes, double vision, eye pain Ear, nose and throat: No hearing loss, ear pain, nasal congestion, sore throat Cardiovascular: No chest pain, palpitations Respiratory:  No shortness of breath at rest or with exertion, wheezes GastrointestinaI: No nausea, vomiting, diarrhea, abdominal pain, fecal incontinence Genitourinary:  No dysuria, urinary retention or frequency Musculoskeletal:  No neck pain, back pain Integumentary: No rash, pruritus, skin lesions Neurological: as above Psychiatric: No depression, insomnia, anxiety Endocrine: No palpitations, fatigue, diaphoresis, mood swings, change in appetite, change in weight, increased thirst Hematologic/Lymphatic:  No anemia, purpura, petechiae. Allergic/Immunologic: no itchy/runny eyes, nasal congestion, recent allergic reactions, rashes  PHYSICAL EXAM: Filed Vitals:   07/04/14 1436  BP: 136/74  Pulse:   Temp:   Resp:   Orthostatics: Left  Right Supine: 130/72  120/56 Sitting: 136/74  120/68 Standing: 100/60  96/62 General: No acute distress Head:  Normocephalic/atraumatic Eyes:  fundi unremarkable, without vessel changes, exudates, hemorrhages or papilledema. Neck: supple, no paraspinal tenderness, full range of motion Back: No paraspinal tenderness Heart: regular rate and rhythm Lungs: Clear to auscultation bilaterally. Vascular: No carotid bruits. Neurological Exam: Mental status: alert and oriented to person, place, and time, recent and remote memory intact, fund of knowledge intact, attention and concentration intact,  speech fluent and not dysarthric, language intact. Cranial nerves: CN I: not tested CN II: pupils equal, round and reactive to light, visual fields intact, fundi unremarkable, without vessel changes, exudates, hemorrhages or papilledema. CN III, IV, VI:  full range of motion, no nystagmus, no ptosis CN V: facial sensation intact CN VII: upper and lower face symmetric CN VIII: hearing intact CN IX, X: gag intact, uvula midline CN XI: sternocleidomastoid and trapezius muscles intact CN XII: tongue midline Bulk & Tone: normal, no fasciculations. Motor:  5/5 throughout except 5-/5 in legs distally. Sensation:  Reduced vibration in feet.  Pinprick sensation intact. Deep Tendon Reflexes:  2+ throughout, toes downgoing. Finger to nose testing:  No dysmetria Gait:  Wide-based gait.  Unable to tandem walk. Romberg negative.  IMPRESSION: Dizziness.  Related to Orthostatic hypotension.  She is orthostatic on testing today.  Also, her symptoms are not characteristic for vertigo.  PLAN: Discuss treatment options with Dr. Dema Severin regarding  dizziness.  45 minutes spent with patient, over 50% spent discussing diagnosis and management.  Thank you for allowing me to take part in the care of this patient.  Metta Clines, DO  CC:  [name], MD

## 2014-07-06 ENCOUNTER — Encounter: Payer: Self-pay | Admitting: Physician Assistant

## 2014-07-06 ENCOUNTER — Ambulatory Visit (INDEPENDENT_AMBULATORY_CARE_PROVIDER_SITE_OTHER): Payer: Medicare Other | Admitting: Physician Assistant

## 2014-07-06 VITALS — BP 122/53 | HR 90 | Ht 65.0 in | Wt 217.0 lb

## 2014-07-06 DIAGNOSIS — I10 Essential (primary) hypertension: Secondary | ICD-10-CM

## 2014-07-06 DIAGNOSIS — I481 Persistent atrial fibrillation: Secondary | ICD-10-CM

## 2014-07-06 DIAGNOSIS — N189 Chronic kidney disease, unspecified: Secondary | ICD-10-CM

## 2014-07-06 DIAGNOSIS — I4819 Other persistent atrial fibrillation: Secondary | ICD-10-CM

## 2014-07-06 DIAGNOSIS — M1990 Unspecified osteoarthritis, unspecified site: Secondary | ICD-10-CM | POA: Diagnosis not present

## 2014-07-06 DIAGNOSIS — R42 Dizziness and giddiness: Secondary | ICD-10-CM | POA: Diagnosis not present

## 2014-07-06 DIAGNOSIS — I4891 Unspecified atrial fibrillation: Secondary | ICD-10-CM | POA: Diagnosis not present

## 2014-07-06 DIAGNOSIS — R600 Localized edema: Secondary | ICD-10-CM

## 2014-07-06 MED ORDER — AMIODARONE HCL 200 MG PO TABS: 200.0000 mg | ORAL_TABLET | ORAL | Status: DC

## 2014-07-06 NOTE — Progress Notes (Signed)
Cardiology Office Note   Date:  07/06/2014   ID:  Heather Benson, DOB 13-Jul-1931, MRN [id number]  PCP:  Vidal Schwalbe, MD  Cardiologist:  Dr. Casandra Doffing     Chief Complaint  Patient presents with  . Atrial Fibrillation     History of Present Illness: Heather Benson is a 81 y.o. female with a hx of paroxysmal atrial fibrillation, HTN, LE edema, GERD.  Seen by Dr. Irish Lack 06/22/14.  She complained of dizziness, edema, palpitations and dyspnea.  She was back in atrial fibrillation. She had previously been on amiodarone. She was placed on bisoprolol for rate control and brought back today for follow-up.  She has had a lot of issues with dizziness. She's had orthostatic hypotension documented in the past by primary care as well as neurology. However, her orthostatic vital signs today are normal. Her dizziness seems to be brought on by prolonged standing. She tells me that, for the last several months, she has not felt well at all. I suspect her symptoms are all related to atrial fibrillation. There are likely made worse by her orthostatic hypotension. She has been to physical therapy. She has been told that she does not have BPPV. She does not wear compression stockings. She denies chest pain. She has chronic dyspnea which is made worse by her struggles with arthritis and polio. She is NYHA 2b-3. She denies orthopnea or PND. She has chronic LE edema without significant change.   Studies/Reports Reviewed Today:  24-hour Holter 02/2014 Intermittent atrial fibrillation with controlled ventricular rate  Echocardiogram 01/2014 Mild LVH, EF 50-55%, normal wall motion Mild AI Mild MR Mild LAE Mildly reduced RVSF Mild TR PASP 32 mmHg  Renal artery ultrasound 12/2011 No RAS  Cardiac cath 05/2010 LAD: Mid mild irregularities EF: 60%    Past Medical History  Diagnosis Date  . Complication of anesthesia 08-28-11    in past arrythmia-  cardiology has seen in past  .  Hypertension 08-28-11    tx. meds  . Swelling of extremity 08-28-11    bilateral lower extremities- mid calf down  . GERD (gastroesophageal reflux disease) 08-28-11    tx. omeprazole  . Cancer 08-28-11    Melonoma-cheek(many Yrs ago) , recent bx. left  face lesion, past skin cancer lesions  . Arthritis 08-28-11    right knee pain-surgery planned  . Neuromuscular disorder 08-28-11    hx. Polio age 4-slight residual affects legs  . Dysrhythmia 08-28-11    only immed.  to several days after surgery- hx. A.fib. in past    Past Surgical History  Procedure Laterality Date  . Cataract extraction, bilateral  08-28-11    bilateral  . Abdominal hysterectomy  08-28-11    Partial-vaginal approach  . Bunionectomy  08-28-11    right  . Knee arthroscopy  09/06/2011    Procedure: ARTHROSCOPY KNEE;  Surgeon: Gearlean Alf, MD;  Location: [facility];  Service: Orthopedics;  Laterality: Right;  with Synovectomy  . Joint replacement  Oct. 1, 2012    Right Knee  . Eye surgery      Cataract     Current Outpatient Prescriptions  Medication Sig Dispense Refill  . apixaban (ELIQUIS) 2.5 MG TABS tablet Take 1 tablet (2.5 mg total) by mouth 2 (two) times daily. 60 tablet 6  . bisoprolol (ZEBETA) 5 MG tablet Take 1 tablet (5 mg total) by mouth 2 (two) times daily. 60 tablet 6  . Cholecalciferol (VITAMIN D3) 2000 UNITS capsule Take 2,000  Units by mouth daily.    . fexofenadine (ALLEGRA) 180 MG tablet Take 180 mg by mouth daily.    . fluticasone (FLONASE) 50 MCG/ACT nasal spray Place 1 spray into the nose daily as needed for allergies.     . furosemide (LASIX) 40 MG tablet Take 40 mg by mouth daily. Can take an additional tablet if swelling persists    . meclizine (ANTIVERT) 25 MG tablet   0  . omeprazole (PRILOSEC) 20 MG capsule Take 20 mg by mouth daily.    [name] OVER THE COUNTER MEDICATION Take 1 tablet by mouth daily. Move Free Ultra    . tetrahydrozoline 0.05 % ophthalmic solution Place 1 drop into both eyes 2  (two) times daily.     No current facility-administered medications for this visit.    Allergies:   Codeine    Social History:  The patient  reports that she has never smoked. She has never used smokeless tobacco. She reports that she does not drink alcohol or use illicit drugs.   Family History:  The patient's family history includes Cancer in her mother and sister; Heart disease in her father and mother; Hyperlipidemia in her mother; Hypertension in her father and mother.    ROS:   Please see the history of present illness.   Review of Systems  HENT: Positive for headaches.   Hematologic/Lymphatic: Bruises/bleeds easily.  All other systems reviewed and are negative.    PHYSICAL EXAM: VS:  BP 122/53 mmHg  Pulse 90  Ht 5\' 5"  (1.651 m)  Wt 217 lb (98.431 kg)  BMI 36.11 kg/m2    Wt Readings from Last 3 Encounters:  07/06/14 217 lb (98.431 kg)  07/04/14 210 lb 11.2 oz (95.573 kg)  06/22/14 216 lb (97.977 kg)     GEN: Well nourished, well developed, in no acute distress HEENT: normal Neck: no JVD, no masses Cardiac:  Normal S1/S2, irreg irreg rhythm; no murmur, no rubs or gallops, trace edema  Respiratory:  clear to auscultation bilaterally, no wheezing, rhonchi or rales. GI: soft, nontender, nondistended, + BS MS: no deformity or atrophy Skin: warm and dry  Neuro:  CNs II-XII intact, Strength and sensation are intact Psych: Normal affect   EKG:  EKG is ordered today.  It demonstrates:   AFib, HR 90, LAD, IVCD, no change from prior tracing   Recent Labs: 12/02/2013: ALT 36* 01/27/2014: Pro B Natriuretic peptide (BNP) 185.0* 03/04/2014: BUN 36*; Creatinine 1.8*; Hemoglobin 11.6*; Platelets 234.0; Potassium 3.7; Sodium 142       ASSESSMENT AND PLAN:  1.  Persistent Atrial Fibrillation:  I suspect that her fatigue and dizziness are primarily related to her atrial fibrillation. Her rate is marginally controlled on her current therapy.  When seen by Dr. Irish Lack last,  he recommended starting amiodarone should she continue to have issues with atrial fibrillation.  I think we should pursue a strategy of rhythm control. She is on the appropriate dose of Eliquis. She has tolerated amiodarone in the past.    -  Start amiodarone 200 mg twice daily 1 week, then 200 mg daily    -  Continue Eliquis    -  Consider arranging DC CV at follow-up 2.  Dizziness: As noted, she does have some issues with orthostatic hypotension. I have recommended that she keep her legs elevated, pump her calf muscles prior to rising from a sitting position. She should by over-the-counter compression stockings. She should wear stockings from the time she wakes up  to the time she goes to bed at night.  3.  Hypertension: Controlled.  4.  Chronic kidney disease: She is managed by nephrology.  5.  LE edema: Stable.  6.  DJD: I asked her to hold off on proceeding with physical therapy until we can better control her heart rate/rhythm.  Current medicines are reviewed at length with the patient today.  The patient does not have concerns regarding medicines.  The following changes have been made:  As above.  Labs/ tests ordered today include:   Orders Placed This Encounter  Procedures  . EKG 12-Lead     Disposition:   FU with me in 2 weeks   Signed, Versie Starks, MHS 07/06/2014 12:26 PM    [facility] [address], [place], [place]  [address] Phone: [phone]; Fax: [phone]

## 2014-07-06 NOTE — Patient Instructions (Addendum)
START AMIODARONE 200 MG TWICE DAILY FOR 1 WEEK; THEN DECREASE TO 200 MG DAILY; RX SENT IN   FOLLOW UP WITH SCOTT WEAVER, PAC IN 2-3 WEEKS SAME DAY DR. Irish Lack IS IN THE OFFICE  PER SCOTT WEAVER, PAC TO GET OTC COMPRESSION STOCKINGS.

## 2014-07-08 ENCOUNTER — Telehealth: Payer: Self-pay | Admitting: Neurology

## 2014-07-08 NOTE — Telephone Encounter (Signed)
Note faxed to Dr Dema Severin at [phone] with confirmation received.

## 2014-07-25 ENCOUNTER — Ambulatory Visit (INDEPENDENT_AMBULATORY_CARE_PROVIDER_SITE_OTHER): Payer: Medicare Other | Admitting: Physician Assistant

## 2014-07-25 ENCOUNTER — Encounter: Payer: Self-pay | Admitting: Physician Assistant

## 2014-07-25 VITALS — BP 130/78 | HR 80 | Ht 65.0 in | Wt 215.0 lb

## 2014-07-25 DIAGNOSIS — R600 Localized edema: Secondary | ICD-10-CM

## 2014-07-25 DIAGNOSIS — I481 Persistent atrial fibrillation: Secondary | ICD-10-CM

## 2014-07-25 DIAGNOSIS — I4819 Other persistent atrial fibrillation: Secondary | ICD-10-CM

## 2014-07-25 DIAGNOSIS — I10 Essential (primary) hypertension: Secondary | ICD-10-CM

## 2014-07-25 DIAGNOSIS — N189 Chronic kidney disease, unspecified: Secondary | ICD-10-CM | POA: Diagnosis not present

## 2014-07-25 DIAGNOSIS — R42 Dizziness and giddiness: Secondary | ICD-10-CM

## 2014-07-25 NOTE — H&P (Signed)
History and Physical   Date:  07/25/2014   ID:  Heather Benson, DOB Jul 26, 1931, MRN [id number]  PCP:  Heather Schwalbe, MD  Cardiologist:  Dr. Casandra Benson     Chief Complaint  Patient presents with  . Atrial Fibrillation    follow up     History of Present Illness: Heather Benson is a 81 y.o. female with a hx of paroxysmal atrial fibrillation, HTN, LE edema, GERD.  Seen by Dr. Irish Benson 06/22/14.  She complained of dizziness, edema, palpitations and dyspnea.  She was back in atrial fibrillation. She had previously been on amiodarone. She was placed on bisoprolol for rate control and brought back for follow-up.  She remained in AFib.  She seemed symptomatic with fatigue and dizziness.  Amiodarone was started and she was brought back today with eye towards DCCV if she remained in AFib.   She continues to feel weak and dizzy. She denies chest pain, orthopnea, PND or syncope. LE edema is unchanged. She remains short of breath with exertion. She is NYHA 3.   Studies/Reports Reviewed Today:  24-hour Holter 02/2014 Intermittent atrial fibrillation with controlled ventricular rate  Echocardiogram 01/2014 Mild LVH, EF 50-55%, normal wall motion Mild AI Mild MR Mild LAE Mildly reduced RVSF Mild TR PASP 32 mmHg  Renal artery ultrasound 12/2011 No RAS  Cardiac cath 05/2010 LAD: Mid mild irregularities EF: 60%    Past Medical History  Diagnosis Date  . Complication of anesthesia 08-28-11    in past arrythmia-  cardiology has seen in past  . Hypertension 08-28-11    tx. meds  . Swelling of extremity 08-28-11    bilateral lower extremities- mid calf down  . GERD (gastroesophageal reflux disease) 08-28-11    tx. omeprazole  . Cancer 08-28-11    Melonoma-cheek(many Yrs ago) , recent bx. left  face lesion, past skin cancer lesions  . Arthritis 08-28-11    right knee pain-surgery planned  . Neuromuscular disorder 08-28-11    hx. Polio age 26-slight residual affects legs  .  Dysrhythmia 08-28-11    only immed.  to several days after surgery- hx. A.fib. in past    Past Surgical History  Procedure Laterality Date  . Cataract extraction, bilateral  08-28-11    bilateral  . Abdominal hysterectomy  08-28-11    Partial-vaginal approach  . Bunionectomy  08-28-11    right  . Knee arthroscopy  09/06/2011    Procedure: ARTHROSCOPY KNEE;  Surgeon: Gearlean Alf, MD;  Location: [facility];  Service: Orthopedics;  Laterality: Right;  with Synovectomy  . Joint replacement  Oct. 1, 2012    Right Knee  . Eye surgery      Cataract     Current Outpatient Prescriptions  Medication Sig Dispense Refill  . amiodarone (PACERONE) 200 MG tablet Take 1 tablet (200 mg total) by mouth as directed. 200 mg twice daily x 1 week then decrease to 200 mg daily 45 tablet 6  . apixaban (ELIQUIS) 2.5 MG TABS tablet Take 1 tablet (2.5 mg total) by mouth 2 (two) times daily. 60 tablet 6  . bisoprolol (ZEBETA) 5 MG tablet Take 1 tablet (5 mg total) by mouth 2 (two) times daily. 60 tablet 6  . Cholecalciferol (VITAMIN D3) 2000 UNITS capsule Take 2,000 Units by mouth daily.    . fexofenadine (ALLEGRA) 180 MG tablet Take 180 mg by mouth daily.    . fluticasone (FLONASE) 50 MCG/ACT nasal spray Place 1 spray into the nose  daily as needed for allergies.     . furosemide (LASIX) 40 MG tablet Take 40 mg by mouth daily. Can take an additional tablet if swelling persists    . meclizine (ANTIVERT) 25 MG tablet   0  . omeprazole (PRILOSEC) 20 MG capsule Take 20 mg by mouth daily.    [name] OVER THE COUNTER MEDICATION Take 1 tablet by mouth daily. Move Free Ultra    . tetrahydrozoline 0.05 % ophthalmic solution Place 1 drop into both eyes 2 (two) times daily.     No current facility-administered medications for this visit.    Allergies:   Codeine    Social History:  The patient  reports that she has never smoked. She has never used smokeless tobacco. She reports that she does not drink alcohol or use illicit  drugs.   Family History:  The patient's family history includes Cancer in her mother and sister; Heart attack in her brother; Heart disease in her father and mother; Hyperlipidemia in her mother; Hypertension in her father and mother; Stroke in her maternal grandmother.    ROS:   Please see the history of present illness.   Review of Systems  HENT: Positive for headaches.   Cardiovascular: Positive for dyspnea on exertion and palpitations.  Respiratory: Positive for sleep disturbances due to breathing.   Neurological: Positive for dizziness and loss of balance.  All other systems reviewed and are negative.    PHYSICAL EXAM: VS:  BP 130/78 mmHg  Pulse 80  Ht 5\' 5"  (1.651 m)  Wt 215 lb (97.523 kg)  BMI 35.78 kg/m2    Wt Readings from Last 3 Encounters:  07/25/14 215 lb (97.523 kg)  07/06/14 217 lb (98.431 kg)  07/04/14 210 lb 11.2 oz (95.573 kg)     GEN: Well nourished, well developed, in no acute distress HEENT: normal Neck: no JVD, no masses Cardiac:  Normal S1/S2, irreg irreg rhythm; no murmur, no rubs or gallops, trace edema  Respiratory:  clear to auscultation bilaterally, no wheezing, rhonchi or rales. GI: soft, nontender, nondistended, + BS MS: no deformity or atrophy Skin: warm and dry  Neuro:  CNs II-XII intact, Strength and sensation are intact Psych: Normal affect   EKG:  EKG is ordered today.  It demonstrates:   AFib, HR 80, no change since prior tracing   Recent Labs: 12/02/2013: ALT 36* 01/27/2014: Pro B Natriuretic peptide (BNP) 185.0* 03/04/2014: BUN 36*; Creatinine 1.8*; Hemoglobin 11.6*; Platelets 234.0; Potassium 3.7; Sodium 142    ASSESSMENT AND PLAN:  1.  Persistent Atrial Fibrillation:  She remains in AFib and I believe she is symptomatic.  She has been on Amiodarone for a couple of weeks.  I have recommended proceeding with DCCV.  She has been on Eliquis without interruption for several months.  We discussed the risks and benefits and she agrees  to proceed.  I reviewed this with Dr. Thompson Grayer (DOD) who agreed. 2.  Dizziness:   She has issues with orthostatic hypotension. She should keep her legs elevated, pump her calf muscles prior to rising from a sitting position and wear compression stockings.   3.  Hypertension: Controlled.    4.  Chronic kidney disease:   She is managed by nephrology.  5.  LE edema:  Continue compression stockings, Lasix.   Current medicines are reviewed at length with the patient today.  The patient does not have concerns regarding medicines.  The following changes have been made:  As above.   Labs/  tests ordered today include:   Orders Placed This Encounter  Procedures  . PTT  . Basic Metabolic Panel (BMET)  . CBC with Differential  . INR/PT  . EKG 12-Lead    Disposition:   FU with Dr. Casandra Benson or me after her DCCV.   Signed, Versie Starks, MHS 07/25/2014 12:25 PM    [facility] [place], [place], [place]  [address] Phone: [phone]; Fax: [phone]

## 2014-07-25 NOTE — Progress Notes (Signed)
Cardiology Office Note   Date:  07/25/2014   ID:  Heather Benson, DOB [birth year], MRN [id number]  PCP:  Vidal Schwalbe, MD  Cardiologist:  Dr. Casandra Doffing     Chief Complaint  Patient presents with  . Atrial Fibrillation    follow up     History of Present Illness: Heather Benson is a 81 y.o. female with a hx of paroxysmal atrial fibrillation, HTN, LE edema, GERD.  Seen by Dr. Irish Lack 06/22/14.  She complained of dizziness, edema, palpitations and dyspnea.  She was back in atrial fibrillation. She had previously been on amiodarone. She was placed on bisoprolol for rate control and brought back for follow-up.  She remained in AFib.  She seemed symptomatic with fatigue and dizziness.  Amiodarone was started and she was brought back today with eye towards DCCV if she remained in AFib.   She continues to feel weak and dizzy. She denies chest pain, orthopnea, PND or syncope. LE edema is unchanged. She remains short of breath with exertion. She is NYHA 3.   Studies/Reports Reviewed Today:  24-hour Holter 02/2014 Intermittent atrial fibrillation with controlled ventricular rate  Echocardiogram 01/2014 Mild LVH, EF 50-55%, normal wall motion Mild AI Mild MR Mild LAE Mildly reduced RVSF Mild TR PASP 32 mmHg  Renal artery ultrasound 12/2011 No RAS  Cardiac cath 05/2010 LAD: Mid mild irregularities EF: 60%    Past Medical History  Diagnosis Date  . Complication of anesthesia 08-28-11    in past arrythmia-  cardiology has seen in past  . Hypertension 08-28-11    tx. meds  . Swelling of extremity 08-28-11    bilateral lower extremities- mid calf down  . GERD (gastroesophageal reflux disease) 08-28-11    tx. omeprazole  . Cancer 08-28-11    Melonoma-cheek(many Yrs ago) , recent bx. left  face lesion, past skin cancer lesions  . Arthritis 08-28-11    right knee pain-surgery planned  . Neuromuscular disorder 08-28-11    hx. Polio age 25-slight residual affects legs  .  Dysrhythmia 08-28-11    only immed.  to several days after surgery- hx. A.fib. in past    Past Surgical History  Procedure Laterality Date  . Cataract extraction, bilateral  08-28-11    bilateral  . Abdominal hysterectomy  08-28-11    Partial-vaginal approach  . Bunionectomy  08-28-11    right  . Knee arthroscopy  09/06/2011    Procedure: ARTHROSCOPY KNEE;  Surgeon: Gearlean Alf, MD;  Location: [facility];  Service: Orthopedics;  Laterality: Right;  with Synovectomy  . Joint replacement  Oct. 1, 2012    Right Knee  . Eye surgery      Cataract     Current Outpatient Prescriptions  Medication Sig Dispense Refill  . amiodarone (PACERONE) 200 MG tablet Take 1 tablet (200 mg total) by mouth as directed. 200 mg twice daily x 1 week then decrease to 200 mg daily 45 tablet 6  . apixaban (ELIQUIS) 2.5 MG TABS tablet Take 1 tablet (2.5 mg total) by mouth 2 (two) times daily. 60 tablet 6  . bisoprolol (ZEBETA) 5 MG tablet Take 1 tablet (5 mg total) by mouth 2 (two) times daily. 60 tablet 6  . Cholecalciferol (VITAMIN D3) 2000 UNITS capsule Take 2,000 Units by mouth daily.    . fexofenadine (ALLEGRA) 180 MG tablet Take 180 mg by mouth daily.    . fluticasone (FLONASE) 50 MCG/ACT nasal spray Place 1 spray into the nose  daily as needed for allergies.     . furosemide (LASIX) 40 MG tablet Take 40 mg by mouth daily. Can take an additional tablet if swelling persists    . meclizine (ANTIVERT) 25 MG tablet   0  . omeprazole (PRILOSEC) 20 MG capsule Take 20 mg by mouth daily.    [name] OVER THE COUNTER MEDICATION Take 1 tablet by mouth daily. Move Free Ultra    . tetrahydrozoline 0.05 % ophthalmic solution Place 1 drop into both eyes 2 (two) times daily.     No current facility-administered medications for this visit.    Allergies:   Codeine    Social History:  The patient  reports that she has never smoked. She has never used smokeless tobacco. She reports that she does not drink alcohol or use illicit  drugs.   Family History:  The patient's family history includes Cancer in her mother and sister; Heart attack in her brother; Heart disease in her father and mother; Hyperlipidemia in her mother; Hypertension in her father and mother; Stroke in her maternal grandmother.    ROS:   Please see the history of present illness.   Review of Systems  HENT: Positive for headaches.   Cardiovascular: Positive for dyspnea on exertion and palpitations.  Respiratory: Positive for sleep disturbances due to breathing.   Neurological: Positive for dizziness and loss of balance.  All other systems reviewed and are negative.    PHYSICAL EXAM: VS:  BP 130/78 mmHg  Pulse 80  Ht 5\' 5"  (1.651 m)  Wt 215 lb (97.523 kg)  BMI 35.78 kg/m2    Wt Readings from Last 3 Encounters:  07/25/14 215 lb (97.523 kg)  07/06/14 217 lb (98.431 kg)  07/04/14 210 lb 11.2 oz (95.573 kg)     GEN: Well nourished, well developed, in no acute distress HEENT: normal Neck: no JVD, no masses Cardiac:  Normal S1/S2, irreg irreg rhythm; no murmur, no rubs or gallops, trace edema  Respiratory:  clear to auscultation bilaterally, no wheezing, rhonchi or rales. GI: soft, nontender, nondistended, + BS MS: no deformity or atrophy Skin: warm and dry  Neuro:  CNs II-XII intact, Strength and sensation are intact Psych: Normal affect   EKG:  EKG is ordered today.  It demonstrates:   AFib, HR 80, no change since prior tracing   Recent Labs: 12/02/2013: ALT 36* 01/27/2014: Pro B Natriuretic peptide (BNP) 185.0* 03/04/2014: BUN 36*; Creatinine 1.8*; Hemoglobin 11.6*; Platelets 234.0; Potassium 3.7; Sodium 142    ASSESSMENT AND PLAN:  1.  Persistent Atrial Fibrillation:  She remains in AFib and I believe she is symptomatic.  She has been on Amiodarone for a couple of weeks.  I have recommended proceeding with DCCV.  She has been on Eliquis without interruption for several months.  We discussed the risks and benefits and she agrees  to proceed.  I reviewed this with Dr. Thompson Grayer (DOD) who agreed. 2.  Dizziness:   She has issues with orthostatic hypotension. She should keep her legs elevated, pump her calf muscles prior to rising from a sitting position and wear compression stockings.   3.  Hypertension: Controlled.    4.  Chronic kidney disease:   She is managed by nephrology.  5.  LE edema:  Continue compression stockings, Lasix.   Current medicines are reviewed at length with the patient today.  The patient does not have concerns regarding medicines.  The following changes have been made:  As above.   Labs/  tests ordered today include:   Orders Placed This Encounter  Procedures  . PTT  . Basic Metabolic Panel (BMET)  . CBC with Differential  . INR/PT  . EKG 12-Lead    Disposition:   FU with Dr. Casandra Doffing or me after her DCCV.   Signed, Versie Starks, MHS 07/25/2014 12:25 PM    [facility] [place], [place], [place]  [address] Phone: [phone]; Fax: [phone]

## 2014-07-25 NOTE — Patient Instructions (Signed)
Your physician recommends that you return for lab work: Monday, 3/21.  (PTT, CBC, BMET)  Your physician recommends that you continue on your current medications as directed. Please refer to the Current Medication list given to you today.

## 2014-07-26 ENCOUNTER — Telehealth: Payer: Self-pay

## 2014-07-26 DIAGNOSIS — M1712 Unilateral primary osteoarthritis, left knee: Secondary | ICD-10-CM | POA: Diagnosis not present

## 2014-07-26 NOTE — Telephone Encounter (Signed)
Left message for patient to call back.  Pt has lab appt next Tuesday 3/22 and scheduled cardioversion Friday 3/25.   See below message from Richardson Dopp as to why pt was called:  I forgot for this patient -     Have her take 1/2 dose Bisoprolol the night before and the AM of her DCCV.    Schedule FU with Dr. Casandra Doffing or me 2 weeks post DCCV    Thanks    Nicki Reaper

## 2014-07-26 NOTE — Telephone Encounter (Signed)
Pt aware of medication update before Cardioversion on 3/25. Scheduled Follow up appointment with Dr. Irish Lack for 4/12 @ 11:45

## 2014-08-01 ENCOUNTER — Other Ambulatory Visit: Payer: No Typology Code available for payment source

## 2014-08-01 ENCOUNTER — Encounter (HOSPITAL_COMMUNITY): Payer: Self-pay | Admitting: Pharmacy Technician

## 2014-08-02 ENCOUNTER — Other Ambulatory Visit (INDEPENDENT_AMBULATORY_CARE_PROVIDER_SITE_OTHER): Payer: Medicare Other

## 2014-08-02 DIAGNOSIS — I481 Persistent atrial fibrillation: Secondary | ICD-10-CM | POA: Diagnosis not present

## 2014-08-02 DIAGNOSIS — I4819 Other persistent atrial fibrillation: Secondary | ICD-10-CM

## 2014-08-02 DIAGNOSIS — R42 Dizziness and giddiness: Secondary | ICD-10-CM | POA: Diagnosis not present

## 2014-08-02 LAB — BASIC METABOLIC PANEL
BUN: 35 mg/dL — ABNORMAL HIGH (ref 6–23)
CALCIUM: 9.3 mg/dL (ref 8.4–10.5)
CO2: 30 mEq/L (ref 19–32)
Chloride: 105 mEq/L (ref 96–112)
Creatinine, Ser: 1.81 mg/dL — ABNORMAL HIGH (ref 0.40–1.20)
GFR: 28.54 mL/min — ABNORMAL LOW (ref >60.00)
GLUCOSE: 98 mg/dL (ref 70–99)
Potassium: 4.5 mEq/L (ref 3.5–5.1)
SODIUM: 140 meq/L (ref 135–145)

## 2014-08-02 LAB — CBC WITH DIFFERENTIAL/PLATELET
BASOS ABS: 0 10*3/uL (ref 0.0–0.1)
Basophils Relative: 0.4 % (ref 0.0–3.0)
EOS ABS: 0.4 10*3/uL (ref 0.0–0.7)
Eosinophils Relative: 4.9 % (ref 0.0–5.0)
HEMATOCRIT: 37.5 % (ref 36.0–46.0)
HEMOGLOBIN: 11.9 g/dL — AB (ref 12.0–15.0)
LYMPHS ABS: 2.5 10*3/uL (ref 0.7–4.0)
Lymphocytes Relative: 34.9 % (ref 12.0–46.0)
MCHC: 31.7 g/dL (ref 30.0–36.0)
MCV: 78.3 fl (ref 78.0–100.0)
MONO ABS: 0.7 10*3/uL (ref 0.1–1.0)
MONOS PCT: 9.5 % (ref 3.0–12.0)
NEUTROS ABS: 3.6 10*3/uL (ref 1.4–7.7)
Neutrophils Relative %: 50.3 % (ref 43.0–77.0)
PLATELETS: 248 10*3/uL (ref 150.0–400.0)
RBC: 4.78 Mil/uL (ref 3.87–5.11)
RDW: 17.3 % — AB (ref 11.5–15.5)
WBC: 7.2 10*3/uL (ref 4.0–10.5)

## 2014-08-02 LAB — PROTIME-INR
INR: 1.3 ratio — ABNORMAL HIGH (ref 0.8–1.0)
Prothrombin Time: 14.4 s — ABNORMAL HIGH (ref 9.6–13.1)

## 2014-08-02 LAB — APTT: aPTT: 31.6 s (ref 23.4–32.7)

## 2014-08-03 ENCOUNTER — Telehealth: Payer: Self-pay | Admitting: Physician Assistant

## 2014-08-03 DIAGNOSIS — Z961 Presence of intraocular lens: Secondary | ICD-10-CM | POA: Diagnosis not present

## 2014-08-03 DIAGNOSIS — H401232 Low-tension glaucoma, bilateral, moderate stage: Secondary | ICD-10-CM | POA: Diagnosis not present

## 2014-08-03 NOTE — Telephone Encounter (Signed)
pt notified about lab results with verbal understanding. Pt asked what time was she supposed to be at [facility] 3/25 for DCCV. I said new protocol for time is now 1 hour and not the 2 hours as before. I advised pt to be there by 11 am 3/25. Pt said thank you.

## 2014-08-03 NOTE — Telephone Encounter (Signed)
New Msg        Pt states she is returning call from [place].    Please call back.

## 2014-08-05 ENCOUNTER — Encounter (HOSPITAL_COMMUNITY)
Admission: RE | Disposition: A | Discharge status: Home Health | Payer: Medicare Other | Source: Ambulatory Visit | Attending: Cardiology

## 2014-08-05 ENCOUNTER — Ambulatory Visit (HOSPITAL_COMMUNITY)
Admission: RE | Admit: 2014-08-05 | Discharge: 2014-08-05 | Disposition: A | Discharge status: Home Health | Payer: Medicare Other | Source: Ambulatory Visit | Attending: Cardiology | Admitting: Cardiology

## 2014-08-05 ENCOUNTER — Encounter (HOSPITAL_COMMUNITY): Payer: Self-pay | Admitting: Anesthesiology

## 2014-08-05 ENCOUNTER — Ambulatory Visit (HOSPITAL_COMMUNITY): Payer: Medicare Other | Admitting: Anesthesiology

## 2014-08-05 ENCOUNTER — Encounter (HOSPITAL_COMMUNITY): Payer: Self-pay

## 2014-08-05 ENCOUNTER — Ambulatory Visit (HOSPITAL_COMMUNITY): Admit: 2014-08-05 | Payer: Self-pay | Admitting: Interventional Cardiology

## 2014-08-05 DIAGNOSIS — Z90710 Acquired absence of both cervix and uterus: Secondary | ICD-10-CM | POA: Diagnosis not present

## 2014-08-05 DIAGNOSIS — I4819 Other persistent atrial fibrillation: Secondary | ICD-10-CM

## 2014-08-05 DIAGNOSIS — Z85820 Personal history of malignant melanoma of skin: Secondary | ICD-10-CM | POA: Diagnosis not present

## 2014-08-05 DIAGNOSIS — I481 Persistent atrial fibrillation: Secondary | ICD-10-CM | POA: Diagnosis not present

## 2014-08-05 DIAGNOSIS — I129 Hypertensive chronic kidney disease with stage 1 through stage 4 chronic kidney disease, or unspecified chronic kidney disease: Secondary | ICD-10-CM | POA: Diagnosis not present

## 2014-08-05 DIAGNOSIS — Z886 Allergy status to analgesic agent status: Secondary | ICD-10-CM | POA: Diagnosis not present

## 2014-08-05 DIAGNOSIS — M13861 Other specified arthritis, right knee: Secondary | ICD-10-CM | POA: Diagnosis not present

## 2014-08-05 DIAGNOSIS — I4891 Unspecified atrial fibrillation: Secondary | ICD-10-CM | POA: Diagnosis not present

## 2014-08-05 DIAGNOSIS — K219 Gastro-esophageal reflux disease without esophagitis: Secondary | ICD-10-CM | POA: Insufficient documentation

## 2014-08-05 DIAGNOSIS — N189 Chronic kidney disease, unspecified: Secondary | ICD-10-CM | POA: Diagnosis not present

## 2014-08-05 DIAGNOSIS — Z96651 Presence of right artificial knee joint: Secondary | ICD-10-CM | POA: Diagnosis not present

## 2014-08-05 HISTORY — PX: CARDIOVERSION: SHX1299

## 2014-08-05 SURGERY — CARDIOVERSION
Anesthesia: General

## 2014-08-05 SURGERY — LEFT HEART CATHETERIZATION WITH CORONARY ANGIOGRAM

## 2014-08-05 MED ORDER — HYDROMORPHONE HCL 1 MG/ML IJ SOLN: 0.2500 mg | INTRAMUSCULAR | Status: DC | PRN

## 2014-08-05 MED ORDER — SODIUM CHLORIDE 0.9 % IJ SOLN: 3.0000 mL | INTRAMUSCULAR | Status: DC | PRN

## 2014-08-05 MED ORDER — LIDOCAINE HCL (CARDIAC) 20 MG/ML IV SOLN
INTRAVENOUS | Status: DC | PRN
  Administered 2014-08-05: 4 mg via INTRAVENOUS

## 2014-08-05 MED ORDER — SODIUM CHLORIDE 0.9 % IV SOLN
INTRAVENOUS | Status: DC | PRN
  Administered 2014-08-05: 12:00:00 via INTRAVENOUS

## 2014-08-05 MED ORDER — SODIUM CHLORIDE 0.9 % IV SOLN
250.0000 mL | INTRAVENOUS | Status: DC
  Administered 2014-08-05: 500 mL via INTRAVENOUS

## 2014-08-05 MED ORDER — ONDANSETRON HCL 4 MG/2ML IJ SOLN: 4.0000 mg | Freq: Once | INTRAMUSCULAR | Status: DC | PRN

## 2014-08-05 MED ORDER — PROPOFOL 10 MG/ML IV BOLUS
INTRAVENOUS | Status: DC | PRN
Start: 2014-08-05 — End: 2014-08-05
  Administered 2014-08-05: 30 mg via INTRAVENOUS

## 2014-08-05 MED ORDER — SODIUM CHLORIDE 0.9 % IJ SOLN: 3.0000 mL | Freq: Two times a day (BID) | INTRAMUSCULAR | Status: DC

## 2014-08-05 NOTE — Interval H&P Note (Signed)
History and Physical Interval Note:  08/05/2014 12:14 PM  Heather Benson  has presented today for surgery, with the diagnosis of AFIB  The various methods of treatment have been discussed with the patient and family. After consideration of risks, benefits and other options for treatment, the patient has consented to  Procedure(s): CARDIOVERSION (N/A) as a surgical intervention .  The patient's history has been reviewed, patient examined, no change in status, stable for surgery.  I have reviewed the patient's chart and labs.  Questions were answered to the patient's satisfaction.     Kelley Knoth [employer]

## 2014-08-05 NOTE — Anesthesia Preprocedure Evaluation (Signed)
Anesthesia Evaluation  Patient identified by MRN, date of birth, ID band Patient awake    Reviewed: Allergy & Precautions, NPO status , Patient's Chart, lab work & pertinent test results  Airway        Dental   Pulmonary          Cardiovascular hypertension, + dysrhythmias Atrial Fibrillation     Neuro/Psych  Neuromuscular disease    GI/Hepatic   Endo/Other    Renal/GU Renal InsufficiencyRenal disease     Musculoskeletal  (+) Arthritis -,   Abdominal   Peds  Hematology   Anesthesia Other Findings   Reproductive/Obstetrics                             Anesthesia Physical Anesthesia Plan  ASA: III  Anesthesia Plan: General   Post-op Pain Management:    Induction: Intravenous  Airway Management Planned: Mask  Additional Equipment:   Intra-op Plan:   Post-operative Plan:   Informed Consent: I have reviewed the patients History and Physical, chart, labs and discussed the procedure including the risks, benefits and alternatives for the proposed anesthesia with the patient or authorized representative who has indicated his/her understanding and acceptance.     Plan Discussed with: CRNA, Anesthesiologist and Surgeon  Anesthesia Plan Comments:         Anesthesia Quick Evaluation

## 2014-08-05 NOTE — Transfer of Care (Signed)
Immediate Anesthesia Transfer of Care Note  Patient: Heather Benson  Procedure(s) Performed: Procedure(s): CARDIOVERSION (N/A)  Patient Location: PACU  Anesthesia Type:MAC  Level of Consciousness: awake, alert  and oriented  Airway & Oxygen Therapy: Patient Spontanous Breathing and Patient connected to nasal cannula oxygen  Post-op Assessment: Report given to RN and Post -op Vital signs reviewed and stable  Post vital signs: Reviewed and stable  Last Vitals:  Filed Vitals:   08/05/14 1224  BP: 153/63  Temp:   Resp: 18    Complications: No apparent anesthesia complications

## 2014-08-05 NOTE — Anesthesia Postprocedure Evaluation (Signed)
  Anesthesia Post-op Note  Patient: Heather Benson  Procedure(s) Performed: Procedure(s): CARDIOVERSION (N/A)  Patient Location: PACU  Anesthesia Type:General  Level of Consciousness: awake, alert , oriented and patient cooperative  Airway and Oxygen Therapy: Patient Spontanous Breathing  Post-op Pain: none  Post-op Assessment: Post-op Vital signs reviewed, Patient's Cardiovascular Status Stable, Respiratory Function Stable, Patent Airway, No signs of Nausea or vomiting and Pain level controlled  Post-op Vital Signs: stable  Last Vitals:  Filed Vitals:   08/05/14 1224  BP: 153/63  Temp:   Resp: 18    Complications: No apparent anesthesia complications

## 2014-08-05 NOTE — Discharge Instructions (Signed)
Monitored Anesthesia Care Monitored anesthesia care is an anesthesia service for a medical procedure. Anesthesia is the loss of the ability to feel pain. It is produced by medicines called anesthetics. It may affect a small area of your body (local anesthesia), a large area of your body (regional anesthesia), or your entire body (general anesthesia). The need for monitored anesthesia care depends your procedure, your condition, and the potential need for regional or general anesthesia. It is often provided during procedures where:   General anesthesia may be needed if there are complications. This is because you need special care when you are under general anesthesia.   You will be under local or regional anesthesia. This is so that you are able to have higher levels of anesthesia if needed.   You will receive calming medicines (sedatives). This is especially the case if sedatives are given to put you in a semi-conscious state of relaxation (deep sedation). This is because the amount of sedative needed to produce this state can be hard to predict. Too much of a sedative can produce general anesthesia. Monitored anesthesia care is performed by one or more health care providers who have special training in all types of anesthesia. You will need to meet with these health care providers before your procedure. During this meeting, they will ask you about your medical history. They will also give you instructions to follow. (For example, you will need to stop eating and drinking before your procedure. You may also need to stop or change medicines you are taking.) During your procedure, your health care providers will stay with you. They will:   Watch your condition. This includes watching your blood pressure, breathing, and level of pain.   Diagnose and treat problems that occur.   Give medicines if they are needed. These may include calming medicines (sedatives) and anesthetics.   Make sure you are  comfortable.  Having monitored anesthesia care does not necessarily mean that you will be under anesthesia. It does mean that your health care providers will be able to manage anesthesia if you need it or if it occurs. It also means that you will be able to have a different type of anesthesia than you are having if you need it. When your procedure is complete, your health care providers will continue to watch your condition. They will make sure any medicines wear off before you are allowed to go home.  Document Released: 01/23/2005 Document Revised: 09/13/2013 Document Reviewed: 06/10/2012 [facility] Patient Information 2015 [place], [place]. This information is not intended to replace advice given to you by your health care provider. Make sure you discuss any questions you have with your health care provider. Electrical Cardioversion Electrical cardioversion is the delivery of a jolt of electricity to change the rhythm of the heart. Sticky patches or metal paddles are placed on the chest to deliver the electricity from a device. This is done to restore a normal rhythm. A rhythm that is too fast or not regular keeps the heart from pumping well. Electrical cardioversion is done in an emergency if:   There is low or no blood pressure as a result of the heart rhythm.   Normal rhythm must be restored as fast as possible to protect the brain and heart from further damage.   It may save a life. Cardioversion may be done for heart rhythms that are not immediately life threatening, such as atrial fibrillation or flutter, in which:   The heart is beating too fast or  is not regular.   Medicine to change the rhythm has not worked.   It is safe to wait in order to allow time for preparation.  Symptoms of the abnormal rhythm are bothersome.  The risk of stroke and other serious problems can be reduced. LET [facility] CARE PROVIDER KNOW ABOUT:   Any allergies you have.  All medicines you are taking,  including vitamins, herbs, eye drops, creams, and over-the-counter medicines.  Previous problems you or members of your family have had with the use of anesthetics.   Any blood disorders you have.   Previous surgeries you have had.   Medical conditions you have. RISKS AND COMPLICATIONS  Generally, this is a safe procedure. However, problems can occur and include:   Breathing problems related to the anesthetic used.  A blood clot that breaks free and travels to other parts of your body. This could cause a stroke or other problems. The risk of this is lowered by use of blood-thinning medicine (anticoagulant) prior to the procedure.  Cardiac arrest (rare). BEFORE THE PROCEDURE   You may have tests to detect blood clots in your heart and to evaluate heart function.  You may start taking anticoagulants so your blood does not clot as easily.   Medicines may be given to help stabilize your heart rate and rhythm. PROCEDURE  You will be given medicine through an IV tube to reduce discomfort and make you sleepy (sedative).   An electrical shock will be delivered. AFTER THE PROCEDURE Your heart rhythm will be watched to make sure it does not change.  Document Released: 04/19/2002 Document Revised: 09/13/2013 Document Reviewed: 11/11/2012 [facility] Patient Information 2015 [place], [place]. This information is not intended to replace advice given to you by your health care provider. Make sure you discuss any questions you have with your health care provider.

## 2014-08-05 NOTE — Anesthesia Procedure Notes (Signed)
Procedure Name: MAC Date/Time: 08/05/2014 12:18 PM Performed by: Eligha Bridegroom Pre-anesthesia Checklist: Patient identified, Emergency Drugs available, Suction available, Patient being monitored and Timeout performed Patient Re-evaluated:Patient Re-evaluated prior to inductionOxygen Delivery Method: Ambu bag Preoxygenation: Pre-oxygenation with 100% oxygen Intubation Type: IV induction

## 2014-08-05 NOTE — Procedures (Addendum)
Electrical Cardioversion Procedure Note Heather Benson [id number] 06-18-1931  Procedure: Electrical Cardioversion Indications:  Atrial Fibrillation  Procedure Details Consent: Risks of procedure as well as the alternatives and risks of each were explained to the (patient/caregiver).  Consent for procedure obtained. Time Out: Verified patient identification, verified procedure, site/side was marked, verified correct patient position, special equipment/implants available, medications/allergies/relevent history reviewed, required imaging and test results available.  Performed  Patient placed on cardiac monitor, pulse oximetry, supplemental oxygen as necessary.  Sedation given: Propofol per anesthesiology. Pacer pads placed anterior and posterior chest.  Cardioverted 1 time(s).  Cardioverted at [facility].  Evaluation Findings: Post procedure EKG shows: NSR Complications: None Patient did tolerate procedure well.   [name] 08/05/2014, 12:20 PM

## 2014-08-08 ENCOUNTER — Encounter (HOSPITAL_COMMUNITY): Payer: Self-pay | Admitting: Cardiology

## 2014-08-23 ENCOUNTER — Encounter: Payer: Self-pay | Admitting: Interventional Cardiology

## 2014-08-23 ENCOUNTER — Ambulatory Visit (INDEPENDENT_AMBULATORY_CARE_PROVIDER_SITE_OTHER): Payer: Medicare Other | Admitting: Interventional Cardiology

## 2014-08-23 VITALS — BP 118/72 | HR 48 | Ht 66.0 in | Wt 215.0 lb

## 2014-08-23 DIAGNOSIS — R001 Bradycardia, unspecified: Secondary | ICD-10-CM

## 2014-08-23 DIAGNOSIS — I4891 Unspecified atrial fibrillation: Secondary | ICD-10-CM

## 2014-08-23 DIAGNOSIS — I10 Essential (primary) hypertension: Secondary | ICD-10-CM | POA: Diagnosis not present

## 2014-08-23 DIAGNOSIS — Z5181 Encounter for therapeutic drug level monitoring: Secondary | ICD-10-CM | POA: Diagnosis not present

## 2014-08-23 DIAGNOSIS — R42 Dizziness and giddiness: Secondary | ICD-10-CM

## 2014-08-23 MED ORDER — BISOPROLOL FUMARATE 5 MG PO TABS: 2.5000 mg | ORAL_TABLET | Freq: Two times a day (BID) | ORAL | Status: DC

## 2014-08-23 NOTE — Progress Notes (Signed)
Patient ID: Heather Benson, female   DOB: [birth year], 81 y.o.   MRN: [id number]     Cardiology Office Note   Date:  08/23/2014   ID:  Heather Benson, DOB April 13, 1932, MRN [id number]  PCP:  Vidal Schwalbe, MD    No chief complaint on file.  follow-up cardioversion   Wt Readings from Last 3 Encounters:  08/23/14 215 lb (97.523 kg)  08/05/14 200 lb (90.719 kg)  07/25/14 215 lb (97.523 kg)       History of Present Illness: Heather Benson is a 81 y.o. female  with a history of paroxysmal atrial fibrillation for several years. It is typically occurred when she has had surgery. Over the last few months, she has had intermittent palpitations and dizziness. She was found to be back in atrial fibrillation. She was set up for cardioversion which was successful at the end of March 2016. Since that time, she feels unchanged. She still has dizziness. She still feels intermittent palpitations. Dizziness is worse when she stands up. She has trouble immediately after standing as well as standing for long periods of time.  No syncope.  No falls.  She does walk regularly while walking her dog. She continues to feel fatigued in general. She'll be following up with her nephrologist next week.    Past Medical History  Diagnosis Date  . Complication of anesthesia 08-28-11    in past arrythmia-  cardiology has seen in past  . Hypertension 08-28-11    tx. meds  . Swelling of extremity 08-28-11    bilateral lower extremities- mid calf down  . GERD (gastroesophageal reflux disease) 08-28-11    tx. omeprazole  . Cancer 08-28-11    Melonoma-cheek(many Yrs ago) , recent bx. left  face lesion, past skin cancer lesions  . Arthritis 08-28-11    right knee pain-surgery planned  . Neuromuscular disorder 08-28-11    hx. Polio age 57-slight residual affects legs  . Dysrhythmia 08-28-11    only immed.  to several days after surgery- hx. A.fib. in past    Past Surgical History  Procedure Laterality Date  .  Cataract extraction, bilateral  08-28-11    bilateral  . Abdominal hysterectomy  08-28-11    Partial-vaginal approach  . Bunionectomy  08-28-11    right  . Knee arthroscopy  09/06/2011    Procedure: ARTHROSCOPY KNEE;  Surgeon: Gearlean Alf, MD;  Location: [facility];  Service: Orthopedics;  Laterality: Right;  with Synovectomy  . Joint replacement  Oct. 1, 2012    Right Knee  . Eye surgery      Cataract  . Cardioversion N/A 08/05/2014    Procedure: CARDIOVERSION;  Surgeon: Larey Dresser, MD;  Location: [place];  Service: Cardiovascular;  Laterality: N/A;     Current Outpatient Prescriptions  Medication Sig Dispense Refill  . acetaminophen (TYLENOL) 500 MG tablet Take 1,000 mg by mouth every 8 (eight) hours as needed for moderate pain (pain in legs).    [name] amiodarone (PACERONE) 200 MG tablet Take 200 mg by mouth daily.    [name] apixaban (ELIQUIS) 2.5 MG TABS tablet Take 1 tablet (2.5 mg total) by mouth 2 (two) times daily. 60 tablet 6  . bisoprolol (ZEBETA) 5 MG tablet Take 0.5 tablets (2.5 mg total) by mouth 2 (two) times daily.    . Cholecalciferol (VITAMIN D3) 2000 UNITS capsule Take 2,000 Units by mouth daily.    . fexofenadine (ALLEGRA) 180 MG tablet Take 180 mg by mouth  daily.    . fluticasone (FLONASE) 50 MCG/ACT nasal spray Place 1 spray into both nostrils daily as needed for allergies.     . furosemide (LASIX) 40 MG tablet Take 40 mg by mouth daily. Can take an additional tablet if swelling persists    . latanoprost (XALATAN) 0.005 % ophthalmic solution Place 1 drop into both eyes at bedtime.  3  . meclizine (ANTIVERT) 25 MG tablet Take 12.5 mg by mouth daily as needed for dizziness.   0  . omeprazole (PRILOSEC) 20 MG capsule Take 20 mg by mouth daily.    [name] OVER THE COUNTER MEDICATION Take 1 tablet by mouth daily. Move Free Ultra    . tetrahydrozoline 0.05 % ophthalmic solution Place 1 drop into both eyes 2 (two) times daily.     No current facility-administered medications for  this visit.    Allergies:   Hydromorphone and Codeine    Social History:  The patient  reports that she has never smoked. She has never used smokeless tobacco. She reports that she does not drink alcohol or use illicit drugs.   Family History:  The patient's family history includes Cancer in her mother and sister; Heart attack in her brother; Heart disease in her father and mother; Hyperlipidemia in her mother; Hypertension in her father and mother; Stroke in her maternal grandmother.    ROS:  Please see the history of present illness.   Otherwise, review of systems are positive for fatigue.   All other systems are reviewed and negative.    PHYSICAL EXAM: VS:  BP 118/72 mmHg  Pulse 48  Ht 5\' 6"  (1.676 m)  Wt 215 lb (97.523 kg)  BMI 34.72 kg/m2 , BMI Body mass index is 34.72 kg/(m^2). GEN: Well nourished, well developed, in no acute distress HEENT: normal Neck: no JVD, carotid bruits, or masses Cardiac: Bradycardic, regular rhythm; no murmurs, rubs, or gallops,no edema  Respiratory:  clear to auscultation bilaterally, normal work of breathing GI: soft, nontender, nondistended, + BS MS: no deformity or atrophy Skin: warm and dry, no rash Neuro:  Strength and sensation are intact Psych: euthymic mood, full affect  No ECG   Recent Labs: 12/02/2013: ALT 36* 01/27/2014: Pro B Natriuretic peptide (BNP) 185.0* 08/02/2014: BUN 35*; Creatinine 1.81*; Hemoglobin 11.9*; Platelets 248.0; Potassium 4.5; Sodium 140   Lipid Panel    Component Value Date/Time   CHOL  05/27/2010 0505    168        ATP III CLASSIFICATION:  <200     mg/dL   Desirable  200-239  mg/dL   Borderline High  >=240    mg/dL   High          TRIG 95 05/27/2010 0505   HDL 51 05/27/2010 0505   CHOLHDL 3.3 05/27/2010 0505   VLDL 19 05/27/2010 0505   LDLCALC  05/27/2010 0505    98        Total Cholesterol/HDL:CHD Risk Coronary Heart Disease Risk Table                     Men   Women  1/2 Average Risk   3.4    3.3  Average Risk       5.0   4.4  2 X Average Risk   9.6   7.1  3 X Average Risk  23.4   11.0        Use the calculated Patient Ratio above and the CHD Risk Table to determine  the patient's CHD Risk.        ATP III CLASSIFICATION (LDL):  <100     mg/dL   Optimal  100-129  mg/dL   Near or Above                    Optimal  130-159  mg/dL   Borderline  160-189  mg/dL   High  >190     mg/dL   Very High     Other studies Reviewed: Additional studies/ records that were reviewed today with results demonstrating: Cardioversion report.   ASSESSMENT AND PLAN:  1. Atrial fibrillation: Maintaining sinus rhythm. Continue amiodarone. Will check LFTs and TSH every 6 months. 2. Bradycardia: May be contributing to her fatigue and dizziness. Will decrease bisoprolol to 2.5 mg twice a day. Hopefully, resting heart rate will increase into the 50s. 3. Lightheadedness: I don't think this is completely coming from her heart rate. I don't think she would benefit from a pacemaker at this point. We'll try to decrease her medications to lower resting heart rate to be as high as possible without having a recurrence of atrial fibrillation. Hopefully, amiodarone will continue to maintain sinus rhythm. 4. HTN: stable   Current medicines are reviewed at length with the patient today.  The patient concerns regarding her medicines were addressed.  The following changes have been made:  No change decrease bisoprolol  Labs/ tests ordered today include: TSH LFTs in a week at her nephrologist.  No orders of the defined types were placed in this encounter.    Recommend 150 minutes/week of aerobic exercise Low fat, low carb, high fiber diet recommended  Disposition:   FU in one month with Richardson Dopp.   Teresita Madura., MD  08/23/2014 1:19 PM    [facility] [place], [place], [place]  [address] Phone: [phone]; Fax: [phone]

## 2014-08-23 NOTE — Patient Instructions (Signed)
Medication Instructions:  DECREASE ZEBETA TO 2.5 MG DAILY  Labwork: YOU HAVE BEEN GIVEN AN RX TO HAVE LAB WORK WITH YOUR NEPHROLOGIST NEXT WEEK; LFT, TSH, PLEASE HAVE THE RESULTS FAXED TO DR. Irish Lack [id number]  Testing/Procedures: NONE  Follow-Up: FOLLOW UP WITH [place], [facility] 09/28/14 11:30  Any Other Special Instructions Will Be Listed Below (If Applicable).

## 2014-08-29 ENCOUNTER — Ambulatory Visit (INDEPENDENT_AMBULATORY_CARE_PROVIDER_SITE_OTHER): Payer: Medicare Other | Admitting: *Deleted

## 2014-08-29 ENCOUNTER — Other Ambulatory Visit: Payer: Self-pay | Admitting: Interventional Cardiology

## 2014-08-29 DIAGNOSIS — I4891 Unspecified atrial fibrillation: Secondary | ICD-10-CM

## 2014-08-29 LAB — BASIC METABOLIC PANEL
BUN: 34 mg/dL — ABNORMAL HIGH (ref 6–23)
CHLORIDE: 103 meq/L (ref 96–112)
CO2: 30 meq/L (ref 19–32)
Calcium: 9.8 mg/dL (ref 8.4–10.5)
Creatinine, Ser: 2.17 mg/dL — ABNORMAL HIGH (ref 0.40–1.20)
GFR: 23.15 mL/min — ABNORMAL LOW (ref >60.00)
GLUCOSE: 91 mg/dL (ref 70–99)
POTASSIUM: 4.6 meq/L (ref 3.5–5.1)
SODIUM: 140 meq/L (ref 135–145)

## 2014-08-29 LAB — CBC
HEMATOCRIT: 37.2 % (ref 36.0–46.0)
HEMOGLOBIN: 11.7 g/dL — AB (ref 12.0–15.0)
MCHC: 31.5 g/dL (ref 30.0–36.0)
MCV: 78.5 fl (ref 78.0–100.0)
Platelets: 230 10*3/uL (ref 150.0–400.0)
RBC: 4.74 Mil/uL (ref 3.87–5.11)
RDW: 18.1 % — ABNORMAL HIGH (ref 11.5–15.5)
WBC: 6.7 10*3/uL (ref 4.0–10.5)

## 2014-08-29 NOTE — Progress Notes (Signed)
Pt was started on Eliquis 2.5mg  BID  for Atrial Fib on 02/03/2014.    Reviewed patients medication list.  Pt is not currently on any combined P-gp and strong CYP3A4 inhibitors/inducers (ketoconazole, traconazole, ritonavir, carbamazepine, phenytoin, rifampin, St. John's wort).  Reviewed labs.  SCr 2.17, Weight 97.72kg.  Dose is appropriate based on weight, age and 88.   Hgb 11.7 and HCT37.2  A full discussion of the nature of anticoagulants has been carried out.  A benefit/risk analysis has been presented to the patient, so that they understand the justification for choosing anticoagulation with Eliquis at this time.  The need for compliance is stressed.  Pt is aware to take the medication twice daily.  Side effects of potential bleeding are discussed, including unusual colored urine or stools, coughing up blood or coffee ground emesis, nose bleeds or serious fall or head trauma.  Discussed signs and symptoms of stroke. The patient should avoid any OTC items containing aspirin or ibuprofen.  Avoid alcohol consumption.   Call if any signs of abnormal bleeding.  Discussed financial obligations and states may have problem in the near future and instructed to talk with her insurance company and pharmacy and to call her MD if she has problems  in obtaining medication.  Next lab test test in 6 months Pt states she has not missed any doses of Eliquis and has had no sign or symptom of bleeding or sign or symptom of stroke Had cardioversion on August 05 2014  Bisoprolol reduced to 2.5mg  BID . Will get CBC and BMET today  08/30/2014 Spoke with pt and informed that she is on the correct dose of Eliquis and made an appt for her to be seen in 6 months and she states understanding.

## 2014-08-30 ENCOUNTER — Other Ambulatory Visit: Payer: Self-pay | Admitting: *Deleted

## 2014-08-30 DIAGNOSIS — I4819 Other persistent atrial fibrillation: Secondary | ICD-10-CM

## 2014-08-30 DIAGNOSIS — I10 Essential (primary) hypertension: Secondary | ICD-10-CM

## 2014-09-07 DIAGNOSIS — H401231 Low-tension glaucoma, bilateral, mild stage: Secondary | ICD-10-CM | POA: Diagnosis not present

## 2014-09-08 ENCOUNTER — Other Ambulatory Visit (INDEPENDENT_AMBULATORY_CARE_PROVIDER_SITE_OTHER): Payer: Medicare Other | Admitting: *Deleted

## 2014-09-08 DIAGNOSIS — I4819 Other persistent atrial fibrillation: Secondary | ICD-10-CM

## 2014-09-08 DIAGNOSIS — I10 Essential (primary) hypertension: Secondary | ICD-10-CM | POA: Diagnosis not present

## 2014-09-08 DIAGNOSIS — I481 Persistent atrial fibrillation: Secondary | ICD-10-CM | POA: Diagnosis not present

## 2014-09-08 LAB — BASIC METABOLIC PANEL
BUN: 25 mg/dL — AB (ref 6–23)
CHLORIDE: 103 meq/L (ref 96–112)
CO2: 30 meq/L (ref 19–32)
Calcium: 9.7 mg/dL (ref 8.4–10.5)
Creatinine, Ser: 1.71 mg/dL — ABNORMAL HIGH (ref 0.40–1.20)
GFR: 30.47 mL/min — AB (ref >60.00)
GLUCOSE: 95 mg/dL (ref 70–99)
POTASSIUM: 5.1 meq/L (ref 3.5–5.1)
Sodium: 137 mEq/L (ref 135–145)

## 2014-09-13 ENCOUNTER — Telehealth: Payer: Self-pay | Admitting: Interventional Cardiology

## 2014-09-13 NOTE — Telephone Encounter (Signed)
Follow Up ° °Pt returned call/sr  °

## 2014-09-13 NOTE — Telephone Encounter (Signed)
The pt is advised of her lab results per Dr Irish Lack. She verbalized understanding.

## 2014-09-27 NOTE — Progress Notes (Signed)
Cardiology Office Note   Date:  09/28/2014   ID:  Heather Benson, DOB Dec 13, 1931, MRN [id number]  Patient Care Team: [name], MD as PCP - General (Family Medicine) Jettie Booze, MD as Consulting Physician (Cardiology) Jamal Maes, MD as Consulting Physician (Nephrology)    Chief Complaint  Patient presents with  . Atrial Fibrillation     History of Present Illness: Heather Benson is a 81 y.o. female with a hx of paroxysmal atrial fibrillation, HTN, LE edema, GERD.  She was seen in February and was back in atrial fibrillation. She was placed on amiodarone and eventually underwent cardioversion 08/05/14.  She saw Dr. Irish Lack in follow-up 08/23/14. She felt no different and continued to have dizziness as well as intermittent palpitations. Dizziness was worse with standing up. She denied syncope. She was noted to be bradycardic and her bisoprolol was decreased. She was given an order to get TSH and LFTs with her nephrologist when seen in the near future.  She returns for FU.  She is here by herself today. She continues to have dizziness. It may be somewhat better. She has dizziness with standing. She's had orthostatic intolerance in the past. When seen recently, orthostatic vital signs were normal. She denies chest pain. She denies syncope or near-syncope. She notes chronic dyspnea with exertion. She describes NYHA 3 symptoms. It is not getting worse. She denies orthopnea, PND. LE edema is unchanged. She notes significant fatigue which is chronic.   Studies/Reports Reviewed Today:  24-hour Holter 02/2014 Intermittent atrial fibrillation with controlled ventricular rate  Echocardiogram 01/2014 Mild LVH, EF 50-55%, normal wall motion Mild AI Mild MR Mild LAE Mildly reduced RVSF Mild TR PASP 32 mmHg  Renal artery ultrasound 12/2011 No RAS  Cardiac cath 05/2010 LAD: Mid mild irregularities EF: 60%    Past Medical History  Diagnosis Date  . Complication of  anesthesia 08-28-11    in past arrythmia-  cardiology has seen in past  . Hypertension 08-28-11    tx. meds  . Swelling of extremity 08-28-11    bilateral lower extremities- mid calf down  . GERD (gastroesophageal reflux disease) 08-28-11    tx. omeprazole  . Cancer 08-28-11    Melonoma-cheek(many Yrs ago) , recent bx. left  face lesion, past skin cancer lesions  . Arthritis 08-28-11    right knee pain-surgery planned  . Neuromuscular disorder 08-28-11    hx. Polio age 28-slight residual affects legs  . Dysrhythmia 08-28-11    only immed.  to several days after surgery- hx. A.fib. in past    Past Surgical History  Procedure Laterality Date  . Cataract extraction, bilateral  08-28-11    bilateral  . Abdominal hysterectomy  08-28-11    Partial-vaginal approach  . Bunionectomy  08-28-11    right  . Knee arthroscopy  09/06/2011    Procedure: ARTHROSCOPY KNEE;  Surgeon: Gearlean Alf, MD;  Location: [facility];  Service: Orthopedics;  Laterality: Right;  with Synovectomy  . Joint replacement  Oct. 1, 2012    Right Knee  . Eye surgery      Cataract  . Cardioversion N/A 08/05/2014    Procedure: CARDIOVERSION;  Surgeon: Larey Dresser, MD;  Location: [place];  Service: Cardiovascular;  Laterality: N/A;     Current Outpatient Prescriptions  Medication Sig Dispense Refill  . acetaminophen (TYLENOL) 500 MG tablet Take 1,000 mg by mouth every 8 (eight) hours as needed for moderate pain (pain in legs).    [name]  amiodarone (PACERONE) 200 MG tablet Take 200 mg by mouth daily.    . Cholecalciferol (VITAMIN D3) 2000 UNITS capsule Take 2,000 Units by mouth daily.    [name] ELIQUIS 2.5 MG TABS tablet TAKE 1 TABLET (2.5 MG TOTAL) BY MOUTH 2 (TWO) TIMES DAILY. 60 tablet 6  . fexofenadine (ALLEGRA) 180 MG tablet Take 180 mg by mouth daily.    . fluticasone (FLONASE) 50 MCG/ACT nasal spray Place 1 spray into both nostrils daily as needed for allergies.     . furosemide (LASIX) 40 MG tablet Take 40 mg by mouth  daily. Can take an additional tablet if swelling persists    . latanoprost (XALATAN) 0.005 % ophthalmic solution Place 1 drop into both eyes at bedtime.  3  . meclizine (ANTIVERT) 25 MG tablet Take 12.5 mg by mouth daily as needed for dizziness.   0  . omeprazole (PRILOSEC) 20 MG capsule Take 20 mg by mouth daily.    [name] OVER THE COUNTER MEDICATION Take 1 tablet by mouth daily. Move Free Ultra    . tetrahydrozoline 0.05 % ophthalmic solution Place 1 drop into both eyes 2 (two) times daily.    [name] amLODipine (NORVASC) 2.5 MG tablet Take 1 tablet (2.5 mg total) by mouth daily. 30 tablet 6   No current facility-administered medications for this visit.    Allergies:   Hydromorphone and Codeine    Social History:  The patient  reports that she has never smoked. She has never used smokeless tobacco. She reports that she does not drink alcohol or use illicit drugs.   Family History:  The patient's family history includes Cancer in her mother and sister; Heart attack in her brother; Heart disease in her father and mother; Hyperlipidemia in her mother; Hypertension in her father and mother; Stroke in her maternal grandmother.    ROS:   Please see the history of present illness.   Review of Systems  HENT: Positive for headaches.   All other systems reviewed and are negative.    PHYSICAL EXAM: VS:  BP 160/78 mmHg  Pulse 49  Ht 5\' 6"  (1.676 m)  Wt 214 lb (97.07 kg)  BMI 34.56 kg/m2    Wt Readings from Last 3 Encounters:  09/28/14 214 lb (97.07 kg)  08/23/14 215 lb (97.523 kg)  08/05/14 200 lb (90.719 kg)     GEN: Well nourished, well developed, in no acute distress HEENT: normal Neck: no JVD, no masses Cardiac:  Normal S1/S2, RRR; no murmur, no rubs or gallops, trace edema   Respiratory:  clear to auscultation bilaterally, no wheezing, rhonchi or rales. GI: soft, nontender, nondistended, + BS MS: no deformity or atrophy Skin: warm and dry  Neuro:  CNs II-XII intact, Strength and  sensation are intact Psych: Normal affect   EKG:  EKG is ordered today.  It demonstrates:   Sinus bradycardia, HR 49, LAD, LVH, T-wave inversions in 3, aVF, V3, V4, question LVH with repolarization abnormality, IVCD   Recent Labs: 12/02/2013: ALT 36* 01/27/2014: Pro B Natriuretic peptide (BNP) 185.0* 08/29/2014: Hemoglobin 11.7*; Platelets 230.0 09/08/2014: BUN 25*; Creatinine 1.71*; Potassium 5.1; Sodium 137    ASSESSMENT AND PLAN:  1.  Persistent Atrial Fibrillation:  Maintaining NSR on Amiodarone.  She is tolerating Eliquis.  I will give her a Rx to get her LFTs and TSH done when she sees Nephrology. 2.  Dizziness:   Etiology not clear.  She has a lot of orthostatic intolerance. She is intolerant of compression stockings.  I suspect her dizziness is multifactorial.  Getting her HR to come up some may not make her dizziness resolve, but may help.  3.  Bradycardia:  I will taper her off of Bisoprolol.  Repeat ECG with BP check in 1 week with the RN.  If HR still < 60, I will get an Event Monitor.   4.  Hypertension: Not Controlled.   She has had some HAs.  I will try her on Norvasc 2.5 mg QD.  Will recheck her BP in 1 week.  If she cannot tolerate this, we may need to try Hydralazine.   5.  Chronic kidney disease:    She is managed by nephrology.  6.  LE edema:  Overall stable.    Current medicines are reviewed at length with the patient today.  Any concerns are as outlined above. The following changes have been made:    Stop Bisoprolol  Start Norvasc 2.5 mg QD   Labs/ tests ordered today include:   Orders Placed This Encounter  Procedures  . EKG 12-Lead    Disposition:   FU me 4 weeks.    Signed, Versie Starks, MHS 09/28/2014 5:38 PM    [facility] [place], [place],   [address] Phone: [phone]; Fax: [phone]

## 2014-09-28 ENCOUNTER — Encounter: Payer: Self-pay | Admitting: Physician Assistant

## 2014-09-28 ENCOUNTER — Ambulatory Visit (INDEPENDENT_AMBULATORY_CARE_PROVIDER_SITE_OTHER): Payer: Medicare Other | Admitting: Physician Assistant

## 2014-09-28 VITALS — BP 160/78 | HR 49 | Ht 66.0 in | Wt 214.0 lb

## 2014-09-28 DIAGNOSIS — I481 Persistent atrial fibrillation: Secondary | ICD-10-CM

## 2014-09-28 DIAGNOSIS — I4819 Other persistent atrial fibrillation: Secondary | ICD-10-CM

## 2014-09-28 DIAGNOSIS — R42 Dizziness and giddiness: Secondary | ICD-10-CM

## 2014-09-28 DIAGNOSIS — I10 Essential (primary) hypertension: Secondary | ICD-10-CM | POA: Diagnosis not present

## 2014-09-28 DIAGNOSIS — R001 Bradycardia, unspecified: Secondary | ICD-10-CM

## 2014-09-28 DIAGNOSIS — N189 Chronic kidney disease, unspecified: Secondary | ICD-10-CM

## 2014-09-28 DIAGNOSIS — R600 Localized edema: Secondary | ICD-10-CM

## 2014-09-28 MED ORDER — AMLODIPINE BESYLATE 2.5 MG PO TABS: 2.5000 mg | ORAL_TABLET | Freq: Every day | ORAL | Status: DC

## 2014-09-28 NOTE — Patient Instructions (Signed)
Medication Instructions:  TAKE  BISOPROLOL 2.5 MG ONCE A DAY FOR 2 DAYS THEN STOP AND   START TAKING NORVASC 2.5 MG ONCE A DAY   Labwork:  LAB SCRIPT SENT WITH YOU FOR NEUROLOGIST   Testing/Procedures:   Follow-Up: IN 1 TO 2 WEEKS FOR A NURSE VISIT  FOR BLOOD PRESSURE CHECK AND EKG   IN 4 WEEKS WITH SCOTT WEAVER ON A DAY DR Irish Lack IN OFFICE PER SCOTT   Any Other Special Instructions Will Be Listed Below (If Applicable).  BRING HOME BLOOD PRESSURE MACHINE  WITH YOU WHEN YOU COME BACK FOR NURSE VISIT

## 2014-10-04 ENCOUNTER — Telehealth: Payer: Self-pay | Admitting: Interventional Cardiology

## 2014-10-04 DIAGNOSIS — N2581 Secondary hyperparathyroidism of renal origin: Secondary | ICD-10-CM | POA: Diagnosis not present

## 2014-10-04 DIAGNOSIS — I4891 Unspecified atrial fibrillation: Secondary | ICD-10-CM | POA: Diagnosis not present

## 2014-10-04 DIAGNOSIS — Z5181 Encounter for therapeutic drug level monitoring: Secondary | ICD-10-CM | POA: Diagnosis not present

## 2014-10-04 DIAGNOSIS — I10 Essential (primary) hypertension: Secondary | ICD-10-CM | POA: Diagnosis not present

## 2014-10-04 DIAGNOSIS — E785 Hyperlipidemia, unspecified: Secondary | ICD-10-CM | POA: Diagnosis not present

## 2014-10-04 DIAGNOSIS — N184 Chronic kidney disease, stage 4 (severe): Secondary | ICD-10-CM | POA: Diagnosis not present

## 2014-10-04 NOTE — Telephone Encounter (Signed)
New Message  Pt wanted to clarify w/ Rn to see if she needs to continue taking metoprolol. Can leave VM if not avail. Please call back and discuss.

## 2014-10-04 NOTE — Telephone Encounter (Signed)
Pt seen by Nephrologist today and Metoprolol was on med list at their office. Pt calling to make sure she was not actually suppose to be taking that medication. Informed pt that she was not to take Metoprolol per her medication list from 5/18 OV. Pt verbalized understanding.

## 2014-10-12 ENCOUNTER — Ambulatory Visit (INDEPENDENT_AMBULATORY_CARE_PROVIDER_SITE_OTHER): Payer: Medicare Other | Admitting: *Deleted

## 2014-10-12 VITALS — BP 136/72 | HR 64 | Wt 216.5 lb

## 2014-10-12 DIAGNOSIS — R001 Bradycardia, unspecified: Secondary | ICD-10-CM

## 2014-10-12 NOTE — Progress Notes (Signed)
Agree. HR improved. Richardson Dopp, PA-C   10/12/2014 5:26 PM

## 2014-10-12 NOTE — Progress Notes (Signed)
The patient is here today for a follow up BP check and EKG per checkout 09/28/14 with Richardson Dopp, PA-C. At her last office visit, she was complaining of dizziness and she was bradycardic (HR- 49 bpm). Her bisoprolol was stopped and she was started on amlodipine 2.5 mg once daily. She is still having some dizziness with standing, but feels this may have improved slightly. She has been tracking her BP and HR at home since 09/30/14- She has ranged from 123-167/ 67-82. HR has been around the mid to upper 60's on average.   Today her BP is: 136/72 right arm 140/70 left arm HR- 64  144/80- right arm- HR- 71 (home monitor)  She is having some increased swelling to her lower extremities over the last 2-3 days. Her lasix was decreased from 40 mg daily to 20 mg daily on 4/19 per Dr. Irish Lack due to her creatinine increasing to ~ 2.1. She had repeat labs on 4/28 and her creatinine was back down to ~ 1.7.  She states she had labs repeated again last week with Dr. Lorrene Reid and was told her kidney function was good. These labs have not been scanned in her chart yet and are unavailable for review.   Reviewed with Richardson Dopp, PA-C. The patient will continue her current medications. Per Nicki Reaper, she may take lasix 40 mg total today, then she may take an extra 1/2 pill (20 mg) daily as needed for increased swelling or weight gain of 2-3 lbs in 24 hours. The patient is aware and verbalizes understanding. I have left a message for the Medical Records department at Dr. Sanda Klein office to please send her most recent labs.

## 2014-10-13 ENCOUNTER — Encounter: Payer: Self-pay | Admitting: Interventional Cardiology

## 2014-10-13 DIAGNOSIS — G4733 Obstructive sleep apnea (adult) (pediatric): Secondary | ICD-10-CM | POA: Diagnosis not present

## 2014-11-01 NOTE — Progress Notes (Signed)
Cardiology Office Note   Date:  11/02/2014   ID:  Heather Benson, DOB [birth year], MRN [id number]  Patient Care Team: [name], MD as PCP - General (Family Medicine) Jettie Booze, MD as Consulting Physician (Cardiology) Jamal Maes, MD as Consulting Physician (Nephrology)    Chief Complaint  Patient presents with  . Atrial Fibrillation     History of Present Illness: Heather Benson is a 81 y.o. female with a hx of paroxysmal atrial fibrillation, HTN, LE edema, GERD.  She was seen in February and was back in atrial fibrillation. She was placed on amiodarone and eventually underwent cardioversion 08/05/14.  She saw Dr. Irish Lack in follow-up 08/23/14. She felt no different and continued to have dizziness as well as intermittent palpitations. Dizziness was worse with standing up. She denied syncope. She was noted to be bradycardic and her bisoprolol was decreased.   Last seen 5/18.  Continued to c/o dizziness and was bradycardic.  Her beta-blocker was stopped.  FU ECG with improved HR.  She returns for FU.    Her heart rate is much improved. However, she remains dizzy. I reviewed her chart extensively today. She was seen by outpatient neuro physical therapy as well as neurology. Testing for BPPV was negative. She did have orthostatic hypotension documented at least once. Most of her dizziness seems to occur while she standing. However, she does describe a spinning sensation at times. She does get dizzy with activity. She continues to have shortness of breath with activity. She is NYHA 2b-3. She denies orthopnea or PND. She has chronic edema. This is unchanged. She denies syncope or near-syncope.  Studies/Reports Reviewed Today:  24-hour Holter 02/2014 Intermittent atrial fibrillation with controlled ventricular rate  Echocardiogram 01/2014 Mild LVH, EF 50-55%, normal wall motion Mild AI Mild MR Mild LAE Mildly reduced RVSF Mild TR PASP 32 mmHg  Renal artery  ultrasound 12/2011 No RAS  Cardiac cath 05/2010 LAD: Mid mild irregularities EF: 60%    Past Medical History  Diagnosis Date  . Complication of anesthesia 08-28-11    in past arrythmia-  cardiology has seen in past  . Hypertension 08-28-11    tx. meds  . Swelling of extremity 08-28-11    bilateral lower extremities- mid calf down  . GERD (gastroesophageal reflux disease) 08-28-11    tx. omeprazole  . Cancer 08-28-11    Melonoma-cheek(many Yrs ago) , recent bx. left  face lesion, past skin cancer lesions  . Arthritis 08-28-11    right knee pain-surgery planned  . Neuromuscular disorder 08-28-11    hx. Polio age 57-slight residual affects legs  . Dysrhythmia 08-28-11    only immed.  to several days after surgery- hx. A.fib. in past    Past Surgical History  Procedure Laterality Date  . Cataract extraction, bilateral  08-28-11    bilateral  . Abdominal hysterectomy  08-28-11    Partial-vaginal approach  . Bunionectomy  08-28-11    right  . Knee arthroscopy  09/06/2011    Procedure: ARTHROSCOPY KNEE;  Surgeon: Gearlean Alf, MD;  Location: [facility];  Service: Orthopedics;  Laterality: Right;  with Synovectomy  . Joint replacement  Oct. 1, 2012    Right Knee  . Eye surgery      Cataract  . Cardioversion N/A 08/05/2014    Procedure: CARDIOVERSION;  Surgeon: Larey Dresser, MD;  Location: [facility];  Service: Cardiovascular;  Laterality: N/A;     Current Outpatient Prescriptions  Medication Sig Dispense Refill  .  acetaminophen (TYLENOL) 500 MG tablet Take 1,000 mg by mouth every 8 (eight) hours as needed for moderate pain (pain in legs).    [name] amiodarone (PACERONE) 200 MG tablet Take 200 mg by mouth daily.    [name] amLODipine (NORVASC) 2.5 MG tablet Take 1 tablet (2.5 mg total) by mouth daily. 30 tablet 6  . Cholecalciferol (VITAMIN D3) 2000 UNITS capsule Take 2,000 Units by mouth daily.    [name] ELIQUIS 2.5 MG TABS tablet TAKE 1 TABLET (2.5 MG TOTAL) BY MOUTH 2 (TWO) TIMES DAILY. 60  tablet 6  . fexofenadine (ALLEGRA) 180 MG tablet Take 180 mg by mouth daily.    . fluticasone (FLONASE) 50 MCG/ACT nasal spray Place 1 spray into both nostrils daily as needed for allergies.     . furosemide (LASIX) 40 MG tablet Take 1/2 tablet (20 mg) once daily. Can take an additional tablet if swelling persists    . latanoprost (XALATAN) 0.005 % ophthalmic solution Place 1 drop into both eyes at bedtime.  3  . meclizine (ANTIVERT) 25 MG tablet Take 12.5 mg by mouth daily as needed for dizziness.   0  . omeprazole (PRILOSEC) 20 MG capsule Take 20 mg by mouth daily.    [name] OVER THE COUNTER MEDICATION Take 1 tablet by mouth daily. Move Free Ultra    . tetrahydrozoline 0.05 % ophthalmic solution Place 1 drop into both eyes 2 (two) times daily.     No current facility-administered medications for this visit.    Allergies:   Hydromorphone and Codeine    Social History:  The patient  reports that she has never smoked. She has never used smokeless tobacco. She reports that she does not drink alcohol or use illicit drugs.   Family History:  The patient's family history includes Cancer in her mother and sister; Heart attack in her brother; Heart disease in her father and mother; Hyperlipidemia in her mother; Hypertension in her father and mother; Stroke in her maternal grandmother.    ROS:   Please see the history of present illness.   Review of Systems  HENT: Positive for headaches.   Cardiovascular: Positive for dyspnea on exertion, irregular heartbeat and leg swelling.  Respiratory: Positive for cough.   Neurological: Positive for loss of balance.  All other systems reviewed and are negative.    PHYSICAL EXAM: VS:  BP 140/78 mmHg  Pulse 63  Ht 5\' 6"  (1.676 m)  Wt 214 lb (97.07 kg)  BMI 34.56 kg/m2    Wt Readings from Last 3 Encounters:  11/02/14 214 lb (97.07 kg)  10/12/14 216 lb 8 oz (98.204 kg)  09/28/14 214 lb (97.07 kg)     GEN: Well nourished, well developed, in no acute  distress HEENT: normal Neck: no JVD, no masses Cardiac:  Normal S1/S2, RRR; no murmur, no rubs or gallops, trace-1+ bilateral LE edema    Respiratory:  clear to auscultation bilaterally, no wheezing, rhonchi or rales. GI: soft, nontender, nondistended, + BS MS: no deformity or atrophy Skin: warm and dry  Neuro:  CNs II-XII intact, Strength and sensation are intact Psych: Normal affect   EKG:  EKG is ordered today.  It demonstrates:   NSR, HR 63, LAD, IVCD, nonspecific ST-T wave changes, QTc 458, no change from prior tracing  Recent Labs: 12/02/2013: ALT 36* 01/27/2014: Pro B Natriuretic peptide (BNP) 185.0* 08/29/2014: Hemoglobin 11.7*; Platelets 230.0 09/08/2014: BUN 25*; Creatinine, Ser 1.71*; Potassium 5.1; Sodium 137  10/04/14 (at [facility] office):  Cr 1.68,  K 4.8, TSH 1.98, ALT 13.  ASSESSMENT AND PLAN:  1.  Persistent Atrial Fibrillation:   Maintaining NSR on Amiodarone.  She is tolerating Eliquis.  She remains on the correct dose. Recent TSH and LFTs ok. 2.  Dizziness:   She continues to have problems with dizziness. The only consistent finding has been orthostatic hypotension. She cannot tolerate compression stockings. I have asked her to try these again. We also discussed keeping her legs elevated and pumping her calf muscles before standing. She should stand slowly from a seated or lying position. 3.  Bradycardia:  Resolved. 4.  Hypertension:  Controlled. 5.  Chronic kidney disease:    She is managed by nephrology.  6.  LE edema:  Overall stable.   7.  Shortness of breath: Etiology not entirely clear. I suspect deconditioning. I offered her a cardiopulmonary stress test. She prefers to hold off for now. I think this is reasonable. I have asked her to slowly increase her activity over time to see if this helps. If she continues to have problems or if her dyspnea worsens, she will contact us.  Current medicines are reviewed at length with the patient today.  Any concerns are as outlined  above. The following changes have been made:   Modified Medications   No medications on file   Discontinued Medications   No medications on file   New Prescriptions   No medications on file     Labs/ tests ordered today include:   Orders Placed This Encounter  Procedures  . EKG 12-Lead    Disposition:   FU  Dr. Irish Lack 4 months.  Signed, Versie Starks, MHS 11/02/2014 12:47 PM    [facility] [address], [place], [place]  [address] Phone: [phone]; Fax: [phone]

## 2014-11-02 ENCOUNTER — Ambulatory Visit (INDEPENDENT_AMBULATORY_CARE_PROVIDER_SITE_OTHER): Payer: Medicare Other | Admitting: Physician Assistant

## 2014-11-02 ENCOUNTER — Encounter: Payer: Self-pay | Admitting: Physician Assistant

## 2014-11-02 VITALS — BP 140/78 | HR 63 | Ht 66.0 in | Wt 214.0 lb

## 2014-11-02 DIAGNOSIS — I481 Persistent atrial fibrillation: Secondary | ICD-10-CM | POA: Diagnosis not present

## 2014-11-02 DIAGNOSIS — N189 Chronic kidney disease, unspecified: Secondary | ICD-10-CM

## 2014-11-02 DIAGNOSIS — R42 Dizziness and giddiness: Secondary | ICD-10-CM

## 2014-11-02 DIAGNOSIS — R001 Bradycardia, unspecified: Secondary | ICD-10-CM

## 2014-11-02 DIAGNOSIS — I4819 Other persistent atrial fibrillation: Secondary | ICD-10-CM

## 2014-11-02 DIAGNOSIS — R0602 Shortness of breath: Secondary | ICD-10-CM

## 2014-11-02 DIAGNOSIS — I10 Essential (primary) hypertension: Secondary | ICD-10-CM

## 2014-11-02 NOTE — Patient Instructions (Signed)
Medication Instructions:  Your physician recommends that you continue on your current medications as directed. Please refer to the Current Medication list given to you today.   Labwork: NONE  Testing/Procedures: NONE  Follow-Up: DR. VARANASI IN 4 MONTHS  Any Other Special Instructions Will Be Listed Below (If Applicable).

## 2015-01-02 DIAGNOSIS — D0472 Carcinoma in situ of skin of left lower limb, including hip: Secondary | ICD-10-CM | POA: Diagnosis not present

## 2015-01-02 DIAGNOSIS — L821 Other seborrheic keratosis: Secondary | ICD-10-CM | POA: Diagnosis not present

## 2015-01-02 DIAGNOSIS — Z85828 Personal history of other malignant neoplasm of skin: Secondary | ICD-10-CM | POA: Diagnosis not present

## 2015-01-02 DIAGNOSIS — L720 Epidermal cyst: Secondary | ICD-10-CM | POA: Diagnosis not present

## 2015-01-02 DIAGNOSIS — Z85820 Personal history of malignant melanoma of skin: Secondary | ICD-10-CM | POA: Diagnosis not present

## 2015-01-02 DIAGNOSIS — L570 Actinic keratosis: Secondary | ICD-10-CM | POA: Diagnosis not present

## 2015-01-02 DIAGNOSIS — D485 Neoplasm of uncertain behavior of skin: Secondary | ICD-10-CM | POA: Diagnosis not present

## 2015-01-02 DIAGNOSIS — D1801 Hemangioma of skin and subcutaneous tissue: Secondary | ICD-10-CM | POA: Diagnosis not present

## 2015-01-04 DIAGNOSIS — G4733 Obstructive sleep apnea (adult) (pediatric): Secondary | ICD-10-CM | POA: Diagnosis not present

## 2015-01-19 DIAGNOSIS — H401212 Low-tension glaucoma, right eye, moderate stage: Secondary | ICD-10-CM | POA: Diagnosis not present

## 2015-01-19 DIAGNOSIS — H10413 Chronic giant papillary conjunctivitis, bilateral: Secondary | ICD-10-CM | POA: Diagnosis not present

## 2015-01-19 DIAGNOSIS — H401222 Low-tension glaucoma, left eye, moderate stage: Secondary | ICD-10-CM | POA: Diagnosis not present

## 2015-02-01 DIAGNOSIS — R05 Cough: Secondary | ICD-10-CM | POA: Diagnosis not present

## 2015-02-01 DIAGNOSIS — Z23 Encounter for immunization: Secondary | ICD-10-CM | POA: Diagnosis not present

## 2015-02-03 ENCOUNTER — Other Ambulatory Visit: Payer: Self-pay | Admitting: *Deleted

## 2015-02-03 MED ORDER — AMLODIPINE BESYLATE 2.5 MG PO TABS: 2.5000 mg | ORAL_TABLET | Freq: Every day | ORAL | Status: DC

## 2015-02-03 MED ORDER — APIXABAN 2.5 MG PO TABS: ORAL_TABLET | ORAL | Status: DC

## 2015-02-03 MED ORDER — AMIODARONE HCL 200 MG PO TABS: 200.0000 mg | ORAL_TABLET | Freq: Every day | ORAL | Status: DC

## 2015-02-20 DIAGNOSIS — G4733 Obstructive sleep apnea (adult) (pediatric): Secondary | ICD-10-CM | POA: Diagnosis not present

## 2015-02-27 ENCOUNTER — Ambulatory Visit (INDEPENDENT_AMBULATORY_CARE_PROVIDER_SITE_OTHER): Payer: Medicare Other | Admitting: *Deleted

## 2015-02-27 DIAGNOSIS — I4891 Unspecified atrial fibrillation: Secondary | ICD-10-CM | POA: Diagnosis not present

## 2015-02-27 LAB — CBC
HEMATOCRIT: 38.3 % (ref 36.0–46.0)
Hemoglobin: 11.9 g/dL — ABNORMAL LOW (ref 12.0–15.0)
MCH: 26.3 pg (ref 26.0–34.0)
MCHC: 31.1 g/dL (ref 30.0–36.0)
MCV: 84.7 fL (ref 78.0–100.0)
MPV: 10.1 fL (ref 8.6–12.4)
PLATELETS: 240 10*3/uL (ref 150–400)
RBC: 4.52 MIL/uL (ref 3.87–5.11)
RDW: 15.8 % — ABNORMAL HIGH (ref 11.5–15.5)
WBC: 7.1 10*3/uL (ref 4.0–10.5)

## 2015-02-27 LAB — BASIC METABOLIC PANEL
BUN: 32 mg/dL — ABNORMAL HIGH (ref 7–25)
CHLORIDE: 103 mmol/L (ref 98–110)
CO2: 29 mmol/L (ref 20–31)
CREATININE: 2.03 mg/dL — AB (ref 0.60–0.88)
Calcium: 9.5 mg/dL (ref 8.6–10.4)
Glucose, Bld: 102 mg/dL — ABNORMAL HIGH (ref 65–99)
Potassium: 4.2 mmol/L (ref 3.5–5.3)
Sodium: 142 mmol/L (ref 135–146)

## 2015-02-27 NOTE — Progress Notes (Signed)
Pt was started on Eliquis 2.5mg  for Afib on February 03, 2014 by Dr. Irish Lack.    Reviewed patients medication list.  Pt is not currently on any combined P-gp and strong CYP3A4 inhibitors/inducers (ketoconazole, traconazole, ritonavir, carbamazepine, phenytoin, rifampin, St. John's wort).  Reviewed labs.  SCr 2.03, Weight-100.1 kg, .  Dose is appropriate based on Age and SrCr    Hgb 11.9 and HCT 38.3  A full discussion of the nature of anticoagulants has been carried out.  A benefit/risk analysis has been presented to the patient, so that they understand the justification for choosing anticoagulation with Eliquis at this time.  The need for compliance is stressed.  Pt is aware to take the medication twice daily.  Side effects of potential bleeding are discussed, including unusual colored urine or stools, coughing up blood or coffee ground emesis, nose bleeds or serious fall or head trauma.  Discussed signs and symptoms of stroke. The patient should avoid any OTC items containing aspirin or ibuprofen.  Avoid alcohol consumption.   Call if any signs of abnormal bleeding.  Discussed financial obligations and resolved any difficulty in obtaining medication.  Will need to check labs in 6 months   Patient states she has not missed any doses of Eliquis, denies having any bleeding problems and no signs or symptoms of a stroke.  Patient stated that her monthly copayment is $168/month and she is willing to continue with this.  Advised that Coumadin/Warfarin is an option and she stated Dr. Irish Lack wants her stay on Eliquis and she prefers to do so as well.  She stated she will ask Dr. Irish Lack about options next week at her appointment but states she will continue paying the higher cost for the medication at this time.   02/28/2015

## 2015-02-28 NOTE — Progress Notes (Signed)
Pt aware of lab results and to keep follow up with Dr. Irish Lack as previously scheduled.

## 2015-03-06 ENCOUNTER — Encounter: Payer: Self-pay | Admitting: Interventional Cardiology

## 2015-03-06 ENCOUNTER — Ambulatory Visit (INDEPENDENT_AMBULATORY_CARE_PROVIDER_SITE_OTHER): Payer: Medicare Other | Admitting: Interventional Cardiology

## 2015-03-06 VITALS — BP 142/80 | HR 63 | Ht 66.0 in | Wt 218.0 lb

## 2015-03-06 DIAGNOSIS — R0609 Other forms of dyspnea: Secondary | ICD-10-CM

## 2015-03-06 DIAGNOSIS — N183 Chronic kidney disease, stage 3 unspecified: Secondary | ICD-10-CM

## 2015-03-06 DIAGNOSIS — I4819 Other persistent atrial fibrillation: Secondary | ICD-10-CM

## 2015-03-06 DIAGNOSIS — R600 Localized edema: Secondary | ICD-10-CM | POA: Diagnosis not present

## 2015-03-06 DIAGNOSIS — I481 Persistent atrial fibrillation: Secondary | ICD-10-CM

## 2015-03-06 NOTE — Progress Notes (Signed)
Patient ID: Heather Benson, female   DOB: 07/22/1931, 81 y.o.   MRN: [id number]     Cardiology Office Note   Date:  03/06/2015   ID:  Heather Benson, DOB [birth year], MRN [id number]  PCP:  Vidal Schwalbe, MD    No chief complaint on file.  F/u PAF  Wt Readings from Last 3 Encounters:  03/06/15 218 lb (98.884 kg)  11/02/14 214 lb (97.07 kg)  10/12/14 216 lb 8 oz (98.204 kg)       History of Present Illness: Heather Benson is a 81 y.o. female  with a hx of paroxysmal atrial fibrillation, HTN, LE edema, GERD. She was seen in February and was back in atrial fibrillation. She was placed on amiodarone and eventually underwent cardioversion 08/05/14. She saw Dr. Irish Lack in follow-up 08/23/14. She felt no different and continued to have dizziness as well as intermittent palpitations. Dizziness was worse with standing up. She denied syncope. She was noted to be bradycardic and her bisoprolol was decreased.   Last seen 5/18. Continued to c/o dizziness and was bradycardic. Her beta-blocker was stopped. FU ECG with improved HR.   Her heart rate improved. However, she remained dizzy. She was seen by outpatient neuro physical therapy as well as neurology. Testing for BPPV was negative. She did have orthostatic hypotension documented at least once. Most of her dizziness seems to occur while she standing. However, she does describe a spinning sensation at times. She does get dizzy with activity. She continues to have shortness of breath with activity. She is NYHA 2b-3. She denies orthopnea or PND. She has chronic edema. This is unchanged. She denies syncope or near-syncope.  Overall, the dizziness and SHOB is better today.  Her biggest complaint is the cost of her Eliquis at this time in the year.     Past Medical History  Diagnosis Date  . Complication of anesthesia 08-28-11    in past arrythmia-  cardiology has seen in past  . Hypertension 08-28-11    tx. meds  . Swelling of  extremity 08-28-11    bilateral lower extremities- mid calf down  . GERD (gastroesophageal reflux disease) 08-28-11    tx. omeprazole  . Cancer [facility]) 08-28-11    Melonoma-cheek(many Yrs ago) , recent bx. left  face lesion, past skin cancer lesions  . Arthritis 08-28-11    right knee pain-surgery planned  . Neuromuscular disorder ([place]) 08-28-11    hx. Polio age 77-slight residual affects legs  . Dysrhythmia 08-28-11    only immed.  to several days after surgery- hx. A.fib. in past    Past Surgical History  Procedure Laterality Date  . Cataract extraction, bilateral  08-28-11    bilateral  . Abdominal hysterectomy  08-28-11    Partial-vaginal approach  . Bunionectomy  08-28-11    right  . Knee arthroscopy  09/06/2011    Procedure: ARTHROSCOPY KNEE;  Surgeon: Gearlean Alf, MD;  Location: [facility];  Service: Orthopedics;  Laterality: Right;  with Synovectomy  . Joint replacement  Oct. 1, 2012    Right Knee  . Eye surgery      Cataract  . Cardioversion N/A 08/05/2014    Procedure: CARDIOVERSION;  Surgeon: Larey Dresser, MD;  Location: [facility];  Service: Cardiovascular;  Laterality: N/A;     Current Outpatient Prescriptions  Medication Sig Dispense Refill  . acetaminophen (TYLENOL) 500 MG tablet Take 1,000 mg by mouth every 8 (eight) hours as needed for moderate pain (pain  in legs).    [name] amiodarone (PACERONE) 200 MG tablet Take 1 tablet (200 mg total) by mouth daily. 90 tablet 0  . amLODipine (NORVASC) 2.5 MG tablet Take 1 tablet (2.5 mg total) by mouth daily. 90 tablet 0  . apixaban (ELIQUIS) 2.5 MG TABS tablet TAKE 1 TABLET (2.5 MG TOTAL) BY MOUTH 2 (TWO) TIMES DAILY. 180 tablet 0  . Cholecalciferol (VITAMIN D3) 2000 UNITS capsule Take 2,000 Units by mouth daily.    . fexofenadine (ALLEGRA) 180 MG tablet Take 180 mg by mouth daily.    . fluticasone (FLONASE) 50 MCG/ACT nasal spray Place 1 spray into both nostrils daily as needed for allergies.     . furosemide (LASIX) 40 MG tablet  Take 1/2 tablet (20 mg) once daily. Can take an additional tablet if swelling persists    . latanoprost (XALATAN) 0.005 % ophthalmic solution Place 1 drop into both eyes at bedtime.  3  . meclizine (ANTIVERT) 25 MG tablet Take 12.5 mg by mouth daily as needed for dizziness.   0  . omeprazole (PRILOSEC) 20 MG capsule Take 20 mg by mouth daily.    [name] OVER THE COUNTER MEDICATION Take 1 tablet by mouth daily. Move Free Ultra    . tetrahydrozoline 0.05 % ophthalmic solution Place 1 drop into both eyes 2 (two) times daily.     No current facility-administered medications for this visit.    Allergies:   Hydromorphone and Codeine    Social History:  The patient  reports that she has never smoked. She has never used smokeless tobacco. She reports that she does not drink alcohol or use illicit drugs.   Family History:  The patient's family history includes Cancer in her mother and sister; Heart attack in her brother; Heart disease in her father and mother; Hyperlipidemia in her mother; Hypertension in her father and mother; Stroke in her maternal grandmother.    ROS:  Please see the history of present illness.   Otherwise, review of systems are positive for leg swelling, better with compression stocking.   All other systems are reviewed and negative.    PHYSICAL EXAM: VS:  BP 142/80 mmHg  Pulse 63  Ht 5\' 6"  (1.676 m)  Wt 218 lb (98.884 kg)  BMI 35.20 kg/m2  SpO2 96% , BMI Body mass index is 35.2 kg/(m^2). GEN: Well nourished, well developed, in no acute distress HEENT: normal Neck: no JVD, carotid bruits, or masses Cardiac: RRR; no murmurs, rubs, or gallops,no edema  Respiratory:  clear to auscultation bilaterally, normal work of breathing GI: soft, nontender, nondistended, + BS MS: no deformity or atrophy Skin: warm and dry, no rash Neuro:  Strength and sensation are intact Psych: euthymic mood, full affect    Recent Labs: 02/27/2015: BUN 32*; Creat 2.03*; Hemoglobin 11.9*; Platelets  240; Potassium 4.2; Sodium 142   Lipid Panel    Component Value Date/Time   CHOL  05/27/2010 0505    168        ATP III CLASSIFICATION:  <200     mg/dL   Desirable  200-239  mg/dL   Borderline High  >=240    mg/dL   High          TRIG 95 05/27/2010 0505   HDL 51 05/27/2010 0505   CHOLHDL 3.3 05/27/2010 0505   VLDL 19 05/27/2010 0505   LDLCALC  05/27/2010 0505    98        Total Cholesterol/HDL:CHD Risk Coronary Heart Disease Risk Table  Men   Women  1/2 Average Risk   3.4   3.3  Average Risk       5.0   4.4  2 X Average Risk   9.6   7.1  3 X Average Risk  23.4   11.0        Use the calculated Patient Ratio above and the CHD Risk Table to determine the patient's CHD Risk.        ATP III CLASSIFICATION (LDL):  <100     mg/dL   Optimal  100-129  mg/dL   Near or Above                    Optimal  130-159  mg/dL   Borderline  160-189  mg/dL   High  >190     mg/dL   Very High     Other studies Reviewed: Additional studies/ records that were reviewed today with results demonstrating: .   ASSESSMENT AND PLAN:  1. AFib:  Maintaining NSR on Amiodarone. She is tolerating Eliquis. She remains on the correct dose. Most Recent TSH and LFTs ok. 2. SHOB: I have encouraged her to try to increase her activity. She needs to try to get up to 5 days a week 30 minutes a day of walking. 3. Chronic kidney disease: Last creatinine was increased. She is artery on the decreased dose of Eliquis. I asked her to drink plenty of water during the day to avoid dehydration. 4. Lower extremity edema: She will resume wearing compression stockings out of the weather is getting cooler. I do not want to give her additional diuretic medication given her issues with dizziness which have been difficult to treat.   Current medicines are reviewed at length with the patient today.  The patient concerns regarding her medicines were addressed.  The following changes have been made:  No  change  Labs/ tests ordered today include:  No orders of the defined types were placed in this encounter.    Recommend 150 minutes/week of aerobic exercise Low fat, low carb, high fiber diet recommended  Disposition:   FU in 6 months   Teresita Madura., MD  03/06/2015 12:10 PM    [facility] [address], [place], [place]  [address] Phone: [phone]; Fax: [phone]

## 2015-03-06 NOTE — Patient Instructions (Signed)
Medication Instructions:  Same-no changes  Labwork: None  Testing/Procedures: None  Follow-Up: Your physician wants you to follow-up in: 6 months. You will receive a reminder letter in the mail two months in advance. If you don't receive a letter, please call our office to schedule the follow-up appointment.      If you need a refill on your cardiac medications before your next appointment, please call your pharmacy.   

## 2015-03-20 DIAGNOSIS — N184 Chronic kidney disease, stage 4 (severe): Secondary | ICD-10-CM | POA: Diagnosis not present

## 2015-03-20 DIAGNOSIS — I10 Essential (primary) hypertension: Secondary | ICD-10-CM | POA: Diagnosis not present

## 2015-03-20 DIAGNOSIS — R42 Dizziness and giddiness: Secondary | ICD-10-CM | POA: Diagnosis not present

## 2015-03-20 DIAGNOSIS — N2581 Secondary hyperparathyroidism of renal origin: Secondary | ICD-10-CM | POA: Diagnosis not present

## 2015-03-20 DIAGNOSIS — E785 Hyperlipidemia, unspecified: Secondary | ICD-10-CM | POA: Diagnosis not present

## 2015-03-21 DIAGNOSIS — Z1231 Encounter for screening mammogram for malignant neoplasm of breast: Secondary | ICD-10-CM | POA: Diagnosis not present

## 2015-03-21 DIAGNOSIS — Z803 Family history of malignant neoplasm of breast: Secondary | ICD-10-CM | POA: Diagnosis not present

## 2015-04-13 DIAGNOSIS — D485 Neoplasm of uncertain behavior of skin: Secondary | ICD-10-CM | POA: Diagnosis not present

## 2015-04-13 DIAGNOSIS — L570 Actinic keratosis: Secondary | ICD-10-CM | POA: Diagnosis not present

## 2015-04-13 DIAGNOSIS — L821 Other seborrheic keratosis: Secondary | ICD-10-CM | POA: Diagnosis not present

## 2015-04-13 DIAGNOSIS — Z85828 Personal history of other malignant neoplasm of skin: Secondary | ICD-10-CM | POA: Diagnosis not present

## 2015-04-13 DIAGNOSIS — L729 Follicular cyst of the skin and subcutaneous tissue, unspecified: Secondary | ICD-10-CM | POA: Diagnosis not present

## 2015-04-25 ENCOUNTER — Other Ambulatory Visit: Payer: Self-pay | Admitting: Physician Assistant

## 2015-04-26 DIAGNOSIS — M1712 Unilateral primary osteoarthritis, left knee: Secondary | ICD-10-CM | POA: Diagnosis not present

## 2015-05-03 DIAGNOSIS — M1712 Unilateral primary osteoarthritis, left knee: Secondary | ICD-10-CM | POA: Diagnosis not present

## 2015-05-10 DIAGNOSIS — M1712 Unilateral primary osteoarthritis, left knee: Secondary | ICD-10-CM | POA: Diagnosis not present

## 2015-08-03 DIAGNOSIS — E785 Hyperlipidemia, unspecified: Secondary | ICD-10-CM | POA: Diagnosis not present

## 2015-08-03 DIAGNOSIS — N184 Chronic kidney disease, stage 4 (severe): Secondary | ICD-10-CM | POA: Diagnosis not present

## 2015-08-03 DIAGNOSIS — I10 Essential (primary) hypertension: Secondary | ICD-10-CM | POA: Diagnosis not present

## 2015-08-03 DIAGNOSIS — N2581 Secondary hyperparathyroidism of renal origin: Secondary | ICD-10-CM | POA: Diagnosis not present

## 2015-08-03 DIAGNOSIS — R42 Dizziness and giddiness: Secondary | ICD-10-CM | POA: Diagnosis not present

## 2015-08-11 DIAGNOSIS — H401221 Low-tension glaucoma, left eye, mild stage: Secondary | ICD-10-CM | POA: Diagnosis not present

## 2015-08-11 DIAGNOSIS — Z961 Presence of intraocular lens: Secondary | ICD-10-CM | POA: Diagnosis not present

## 2015-08-11 DIAGNOSIS — H5211 Myopia, right eye: Secondary | ICD-10-CM | POA: Diagnosis not present

## 2015-08-11 DIAGNOSIS — H401211 Low-tension glaucoma, right eye, mild stage: Secondary | ICD-10-CM | POA: Diagnosis not present

## 2015-08-30 ENCOUNTER — Other Ambulatory Visit: Payer: Self-pay | Admitting: Interventional Cardiology

## 2015-09-05 ENCOUNTER — Encounter: Payer: Self-pay | Admitting: Interventional Cardiology

## 2015-09-05 ENCOUNTER — Ambulatory Visit (INDEPENDENT_AMBULATORY_CARE_PROVIDER_SITE_OTHER): Payer: Medicare Other | Admitting: Interventional Cardiology

## 2015-09-05 VITALS — BP 160/90 | HR 64 | Ht 66.0 in | Wt 220.0 lb

## 2015-09-05 DIAGNOSIS — I481 Persistent atrial fibrillation: Secondary | ICD-10-CM

## 2015-09-05 DIAGNOSIS — N183 Chronic kidney disease, stage 3 unspecified: Secondary | ICD-10-CM

## 2015-09-05 DIAGNOSIS — I10 Essential (primary) hypertension: Secondary | ICD-10-CM | POA: Diagnosis not present

## 2015-09-05 DIAGNOSIS — R600 Localized edema: Secondary | ICD-10-CM

## 2015-09-05 DIAGNOSIS — I4819 Other persistent atrial fibrillation: Secondary | ICD-10-CM

## 2015-09-05 NOTE — Progress Notes (Signed)
Patient ID: Heather Benson, female   DOB: [birth year], 82 y.o.   MRN: [id number]     Cardiology Office Note   Date:  09/05/2015   ID:  Heather Benson, DOB 07-12-1931, MRN [id number]  PCP:  Heather Schwalbe, MD    No chief complaint on file.  F/u PAF  Wt Readings from Last 3 Encounters:  09/05/15 220 lb (99.791 kg)  03/06/15 218 lb (98.884 kg)  11/02/14 214 lb (97.07 kg)       History of Present Illness: Heather Benson is a 82 y.o. female  with a hx of paroxysmal atrial fibrillation, HTN, LE edema, GERD. She was seen in February 2016 and was back in atrial fibrillation. She was placed on amiodarone and eventually underwent cardioversion 08/05/14. She saw Dr. Irish Lack in follow-up 08/23/14. She felt no different and continued to have dizziness as well as intermittent palpitations. Dizziness was worse with standing up. She denied syncope. She was noted to be bradycardic and her bisoprolol was decreased.   In 5/16, she Continued to c/o dizziness and was bradycardic. Her beta-blocker was stopped. FU ECG with improved HR.   Her heart rate improved. However, she remained dizzy. She was seen by outpatient neuro physical therapy as well as neurology. Testing for BPPV was negative. She did have orthostatic hypotension documented at least once. Most of her dizziness seems to occur while she standing. However, she does describe a spinning sensation at times.   She does get dizzy with activity. No syncope or falls. She continues to have shortness of breath with activity. She walks the dog for about 10 minutes at a time. She denies orthopnea or PND. She has chronic edema. This is unchanged. She denies syncope or near-syncope.  Overall, the dizziness and SHOB is better today.  Her biggest complaint is the cost of her Eliquis later in the year.  No bleeding issues.  She is back on Lasix for the leg swelling.    Very rarely, she feels palpitations at night.      Past Medical History    Diagnosis Date  . Complication of anesthesia 08-28-11    in past arrythmia-  cardiology has seen in past  . Hypertension 08-28-11    tx. meds  . Swelling of extremity 08-28-11    bilateral lower extremities- mid calf down  . GERD (gastroesophageal reflux disease) 08-28-11    tx. omeprazole  . Cancer [facility]) 08-28-11    Melonoma-cheek(many Yrs ago) , recent bx. left  face lesion, past skin cancer lesions  . Arthritis 08-28-11    right knee pain-surgery planned  . Neuromuscular disorder ([place]) 08-28-11    hx. Polio age 12-slight residual affects legs  . Dysrhythmia 08-28-11    only immed.  to several days after surgery- hx. A.fib. in past    Past Surgical History  Procedure Laterality Date  . Cataract extraction, bilateral  08-28-11    bilateral  . Abdominal hysterectomy  08-28-11    Partial-vaginal approach  . Bunionectomy  08-28-11    right  . Knee arthroscopy  09/06/2011    Procedure: ARTHROSCOPY KNEE;  Surgeon: Gearlean Alf, MD;  Location: [facility];  Service: Orthopedics;  Laterality: Right;  with Synovectomy  . Joint replacement  Oct. 1, 2012    Right Knee  . Eye surgery      Cataract  . Cardioversion N/A 08/05/2014    Procedure: CARDIOVERSION;  Surgeon: Larey Dresser, MD;  Location: [place];  Service: Cardiovascular;  Laterality: N/A;     Current Outpatient Prescriptions  Medication Sig Dispense Refill  . acetaminophen (TYLENOL) 500 MG tablet Take 1,000 mg by mouth every 8 (eight) hours as needed for moderate pain (pain in legs).    [name] amiodarone (PACERONE) 200 MG tablet Take 1 tablet (200 mg total) by mouth daily. 90 tablet 0  . amLODipine (NORVASC) 2.5 MG tablet Take 1 tablet (2.5 mg total) by mouth daily. 90 tablet 0  . Cholecalciferol (VITAMIN D3) 2000 UNITS capsule Take 2,000 Units by mouth daily.    [name] ELIQUIS 2.5 MG TABS tablet TAKE 1 TABLET (2.5 MG TOTAL) BY MOUTH 2 (TWO) TIMES DAILY. 180 tablet 1  . fexofenadine (ALLEGRA) 180 MG tablet Take 180 mg by mouth daily.     . fluticasone (FLONASE) 50 MCG/ACT nasal spray Place 1 spray into both nostrils daily as needed for allergies.     . furosemide (LASIX) 40 MG tablet Take 40 mg by mouth daily.     [name] latanoprost (XALATAN) 0.005 % ophthalmic solution Place 1 drop into both eyes at bedtime.  3  . meclizine (ANTIVERT) 25 MG tablet Take 12.5 mg by mouth daily as needed for dizziness.   0  . omeprazole (PRILOSEC) 20 MG capsule Take 20 mg by mouth daily.    [name] OVER THE COUNTER MEDICATION Take 1 tablet by mouth daily. Move Free Ultra    . tetrahydrozoline 0.05 % ophthalmic solution Place 1 drop into both eyes 2 (two) times daily.     No current facility-administered medications for this visit.    Allergies:   Hydromorphone and Codeine    Social History:  The patient  reports that she has never smoked. She has never used smokeless tobacco. She reports that she does not drink alcohol or use illicit drugs.   Family History:  The patient's family history includes Cancer in her mother and sister; Heart attack in her brother; Heart disease in her father and mother; Hyperlipidemia in her mother; Hypertension in her father and mother; Stroke in her maternal grandmother.    ROS:  Please see the history of present illness.   Otherwise, review of systems are positive for leg swelling, better with compression stocking.   All other systems are reviewed and negative.    PHYSICAL EXAM: VS:  BP 160/90 mmHg  Pulse 64  Ht 5\' 6"  (1.676 m)  Wt 220 lb (99.791 kg)  BMI 35.53 kg/m2 , BMI Body mass index is 35.53 kg/(m^2). GEN: Well nourished, well developed, in no acute distress HEENT: normal Neck: no JVD, carotid bruits, or masses Cardiac: RRR; no murmurs, rubs, or gallops,no edema  Respiratory:  clear to auscultation bilaterally, normal work of breathing GI: soft, nontender, nondistended, + BS MS: no deformity or atrophy Skin: warm and dry, no rash Neuro:  Strength and sensation are intact Psych: euthymic mood, full  affect    Recent Labs: 02/27/2015: BUN 32*; Creat 2.03*; Hemoglobin 11.9*; Platelets 240; Potassium 4.2; Sodium 142   Lipid Panel    Component Value Date/Time   CHOL  05/27/2010 0505    168        ATP III CLASSIFICATION:  <200     mg/dL   Desirable  200-239  mg/dL   Borderline High  >=240    mg/dL   High          TRIG 95 05/27/2010 0505   HDL 51 05/27/2010 0505   CHOLHDL 3.3 05/27/2010 0505   VLDL 19 05/27/2010 0505  [place]  05/27/2010 0505    98        Total Cholesterol/HDL:CHD Risk Coronary Heart Disease Risk Table                     Men   Women  1/2 Average Risk   3.4   3.3  Average Risk       5.0   4.4  2 X Average Risk   9.6   7.1  3 X Average Risk  23.4   11.0        Use the calculated Patient Ratio above and the CHD Risk Table to determine the patient's CHD Risk.        ATP III CLASSIFICATION (LDL):  <100     mg/dL   Optimal  100-129  mg/dL   Near or Above                    Optimal  130-159  mg/dL   Borderline  160-189  mg/dL   High  >190     mg/dL   Very High     Other studies Reviewed: Additional studies/ records that were reviewed today with results demonstrating: Creatinine was as high as 2.03 but has decreased 1.85 on last check with nephrologist..   ASSESSMENT AND PLAN:  1. AFib:  Maintaining NSR on Amiodarone. She is tolerating Eliquis. She remains on the correct dose. Most Recent TSH and LFTs ok.  Follwed with PMD.  Will have to get those records.  We'll contact Dr. Orest Dikes office. 2. SHOB: I have encouraged her to try to increase her activity. She needs to try to get up to 5 days a week 30 minutes a day of walking. 3. Chronic kidney disease: Last creatinine was increased. She is already on the decreased dose of Eliquis. I asked her to drink plenty of water during the day to avoid dehydration.  Followed by Nephrology, Dr. Clint Lipps recent creatinine from the nephrologists office was reviewed and was 1.85. 4. Lower extremity edema: She will  resume wearing compression stockings out of the weather is getting cooler. I do not want to give her additional diuretic medication given her issues with dizziness which have been difficult to treat. 5. HTN BP at home is in the 135-140 range.  Better controlled than in the office.  I am hesitant to increase her blood pressure medicines due to her dizziness issues.  Blood pressure will also improve with increased exercise.   Current medicines are reviewed at length with the patient today.  The patient concerns regarding her medicines were addressed.  The following changes have been made:  No change  Labs/ tests ordered today include:   Orders Placed This Encounter  Procedures  . EKG 12-Lead    Recommend 150 minutes/week of aerobic exercise Low fat, low carb, high fiber diet recommended  Disposition:   FU in 9 months   Teresita Madura., MD  09/05/2015 8:41 AM    [facility] [place], [place], [place]  [address] Phone: [phone]; Fax: [phone]

## 2015-09-05 NOTE — Patient Instructions (Signed)
**Note De-identified Heather Benson Obfuscation** Medication Instructions:  Same-no changes  Labwork: None  Testing/Procedures: None  Follow-Up: Your physician wants you to follow-up in: 9 months. You will receive a reminder letter in the mail two months in advance. If you don't receive a letter, please call our office to schedule the follow-up appointment.     If you need a refill on your cardiac medications before your next appointment, please call your pharmacy.   

## 2015-09-18 DIAGNOSIS — Z85828 Personal history of other malignant neoplasm of skin: Secondary | ICD-10-CM | POA: Diagnosis not present

## 2015-09-18 DIAGNOSIS — D1801 Hemangioma of skin and subcutaneous tissue: Secondary | ICD-10-CM | POA: Diagnosis not present

## 2015-09-18 DIAGNOSIS — L570 Actinic keratosis: Secondary | ICD-10-CM | POA: Diagnosis not present

## 2015-09-18 DIAGNOSIS — D692 Other nonthrombocytopenic purpura: Secondary | ICD-10-CM | POA: Diagnosis not present

## 2015-09-18 DIAGNOSIS — Z85820 Personal history of malignant melanoma of skin: Secondary | ICD-10-CM | POA: Diagnosis not present

## 2015-09-18 DIAGNOSIS — L821 Other seborrheic keratosis: Secondary | ICD-10-CM | POA: Diagnosis not present

## 2015-10-23 DIAGNOSIS — R42 Dizziness and giddiness: Secondary | ICD-10-CM | POA: Diagnosis not present

## 2015-10-23 DIAGNOSIS — R05 Cough: Secondary | ICD-10-CM | POA: Diagnosis not present

## 2015-10-23 DIAGNOSIS — R197 Diarrhea, unspecified: Secondary | ICD-10-CM | POA: Diagnosis not present

## 2015-10-25 ENCOUNTER — Other Ambulatory Visit: Payer: Self-pay | Admitting: Family Medicine

## 2015-10-25 ENCOUNTER — Ambulatory Visit
Admission: RE | Admit: 2015-10-25 | Discharge: 2015-10-25 | Disposition: A | Discharge status: Home / Self Care | Payer: Medicare Other | Source: Ambulatory Visit | Attending: Family Medicine | Admitting: Family Medicine

## 2015-10-25 DIAGNOSIS — R05 Cough: Secondary | ICD-10-CM | POA: Diagnosis not present

## 2015-10-25 DIAGNOSIS — R059 Cough, unspecified: Secondary | ICD-10-CM

## 2015-10-30 ENCOUNTER — Other Ambulatory Visit: Payer: Self-pay | Admitting: Interventional Cardiology

## 2015-11-09 ENCOUNTER — Encounter: Payer: Self-pay | Admitting: *Deleted

## 2015-11-13 ENCOUNTER — Encounter: Payer: Self-pay | Admitting: Diagnostic Neuroimaging

## 2015-11-13 ENCOUNTER — Ambulatory Visit (INDEPENDENT_AMBULATORY_CARE_PROVIDER_SITE_OTHER): Payer: Medicare Other | Admitting: Diagnostic Neuroimaging

## 2015-11-13 VITALS — Ht 66.0 in | Wt 218.0 lb

## 2015-11-13 DIAGNOSIS — R292 Abnormal reflex: Secondary | ICD-10-CM

## 2015-11-13 DIAGNOSIS — G20 Parkinson's disease: Secondary | ICD-10-CM

## 2015-11-13 DIAGNOSIS — I4819 Other persistent atrial fibrillation: Secondary | ICD-10-CM

## 2015-11-13 DIAGNOSIS — N184 Chronic kidney disease, stage 4 (severe): Secondary | ICD-10-CM | POA: Diagnosis not present

## 2015-11-13 DIAGNOSIS — R269 Unspecified abnormalities of gait and mobility: Secondary | ICD-10-CM

## 2015-11-13 DIAGNOSIS — M542 Cervicalgia: Secondary | ICD-10-CM | POA: Diagnosis not present

## 2015-11-13 DIAGNOSIS — Z7901 Long term (current) use of anticoagulants: Secondary | ICD-10-CM

## 2015-11-13 DIAGNOSIS — I951 Orthostatic hypotension: Secondary | ICD-10-CM | POA: Diagnosis not present

## 2015-11-13 DIAGNOSIS — R42 Dizziness and giddiness: Secondary | ICD-10-CM

## 2015-11-13 DIAGNOSIS — I5033 Acute on chronic diastolic (congestive) heart failure: Secondary | ICD-10-CM | POA: Diagnosis not present

## 2015-11-13 DIAGNOSIS — I481 Persistent atrial fibrillation: Secondary | ICD-10-CM | POA: Diagnosis not present

## 2015-11-13 NOTE — Progress Notes (Addendum)
[facility]  PATIENT: Heather Benson DOB: June 11, 1931  REFERRING CLINICIAN: C White  HISTORY FROM: patient  REASON FOR VISIT: new consult    HISTORICAL  CHIEF COMPLAINT:  Chief Complaint  Patient presents with  . New Patient (Initial Visit)    RM 6, alone. Paper referral from Dr Dema Severin for dizziness for the last 18 months. She has seen a previous neurologist for the same thing. She has orthos checked and they sent her to vestibular rehab but she is continuing to have sx.    HISTORY OF PRESENT ILLNESS:   82 year old right-handed female here for evaluation of dizziness.   Since 2012 patient has had intermittent episodes of lightheadedness, fear of passing out, almost blacking out sensations especially when she stands up from sitting position or if she is standing up for a long time. This has happened several times when she is at church. She typically grabs onto something, studies herself, and then avoid passing out. Episodes are happening frequently almost once per day. Patient was evaluated by neurology and cardiology in the past for this problem, found to have orthostatic hypotension, advised to increase fluid intake, adjust blood pressure medication and gradually move from sitting to standing position. In spite of these interventions patient has continued to have problems.  Patient has another type of "dizziness" problem which she describes as a balance problem. Patient describes at least one year of progressive unsteadiness with balance and walking. She has not fallen down. She typically uses a cane if she has to walk a long distance. Otherwise she tends to hold onto furniture and people to balance herself. She denies any room spinning or vertigo, nausea or vomiting with these attacks.  Patient has noticed some intermittent tremor in her arms.  Patient denies any change in smell, taste, sleep, anxiety or constipation. No unilateral numbness, tingling, weakness,  slurred speech or trouble talking. She has had a chronic cough for the past one year. People tell her that she tends to speak quietly.   REVIEW OF SYSTEMS: Full 14 system review of systems performed and negative with exception of:  Insomnia dizziness decreased energy easy bruising cough shortness of breath wheezing swelling in legs fatigue diarrhea joint pain.   ALLERGIES: Allergies  Allergen Reactions  . Hydromorphone Nausea And Vomiting  . Codeine Other (See Comments)    "Spaced out feeling"  . Boniva [Ibandronic Acid] Diarrhea  . Lisinopril Cough    HOME MEDICATIONS: Outpatient Prescriptions Prior to Visit  Medication Sig Dispense Refill  . acetaminophen (TYLENOL) 500 MG tablet Take 1,000 mg by mouth every 8 (eight) hours as needed for moderate pain (pain in legs).    [name] amiodarone (PACERONE) 200 MG tablet TAKE 1 TABLET (200 MG TOTAL) BY MOUTH DAILY. 90 tablet 3  . amLODipine (NORVASC) 2.5 MG tablet Take 1 tablet (2.5 mg total) by mouth daily. 90 tablet 0  . Cholecalciferol (VITAMIN D3) 2000 UNITS capsule Take 2,000 Units by mouth daily.    [name] ELIQUIS 2.5 MG TABS tablet TAKE 1 TABLET (2.5 MG TOTAL) BY MOUTH 2 (TWO) TIMES DAILY. 180 tablet 1  . fexofenadine (ALLEGRA) 180 MG tablet Take 180 mg by mouth daily.    . fluticasone (FLONASE) 50 MCG/ACT nasal spray Place 1 spray into both nostrils daily as needed for allergies.     . furosemide (LASIX) 40 MG tablet Take 40 mg by mouth daily.     [name] latanoprost (XALATAN) 0.005 % ophthalmic solution Place 1 drop into both  eyes at bedtime.  3  . meclizine (ANTIVERT) 25 MG tablet Take 12.5 mg by mouth daily as needed for dizziness.   0  . omeprazole (PRILOSEC) 20 MG capsule Take 20 mg by mouth daily.    [name] OVER THE COUNTER MEDICATION Take 1 tablet by mouth daily. Move Free Ultra    . tetrahydrozoline 0.05 % ophthalmic solution Place 1 drop into both eyes 2 (two) times daily.     No facility-administered medications prior to visit.    PAST  MEDICAL HISTORY: Past Medical History  Diagnosis Date  . Complication of anesthesia 08-28-11    in past arrythmia-  cardiology has seen in past  . Hypertension 08-28-11    tx. meds  . Swelling of extremity 08-28-11    bilateral lower extremities- mid calf down  . GERD (gastroesophageal reflux disease) 08-28-11    tx. omeprazole  . Cancer [facility]) 08-28-11    Melonoma-cheek(many Yrs ago) , recent bx. left  face lesion, past skin cancer lesions  . Arthritis 08-28-11    right knee pain-surgery planned  . Neuromuscular disorder ([place]) 08-28-11    hx. Polio age 48-slight residual affects legs  . Dysrhythmia 08-28-11    only immed.  to several days after surgery- hx. A.fib. in past  . IBS (irritable bowel syndrome)     PAST SURGICAL HISTORY: Past Surgical History  Procedure Laterality Date  . Cataract extraction, bilateral  08-28-11    bilateral  . Abdominal hysterectomy  08-28-11    Partial-vaginal approach  . Bunionectomy  08-28-11    right  . Knee arthroscopy  09/06/2011    Procedure: ARTHROSCOPY KNEE;  Surgeon: Gearlean Alf, MD;  Location: [facility];  Service: Orthopedics;  Laterality: Right;  with Synovectomy  . Joint replacement  Oct. 1, 2012    Right Knee  . Eye surgery      Cataract  . Cardioversion N/A 08/05/2014    Procedure: CARDIOVERSION;  Surgeon: Larey Dresser, MD;  Location: [facility] ENDOSCOPY;  Service: Cardiovascular;  Laterality: N/A;    FAMILY HISTORY: Family History  Problem Relation Age of Onset  . Cancer Mother   . Heart disease Mother     Varicose Veins  . Hyperlipidemia Mother   . Hypertension Mother   . Heart disease Father     Heart Disease before age 21  . Hypertension Father   . Cancer Sister   . Stroke Maternal Grandmother   . Heart attack Brother     SOCIAL HISTORY:  Social History   Social History  . Marital Status: Married    Spouse Name: [name]  . Number of Children: 3  . Years of Education: 12   Occupational History  . Retired- [employer] History Main Topics  . Smoking status: Never Smoker   . Smokeless tobacco: Never Used  . Alcohol Use: No  . Drug Use: No  . Sexual Activity:    Partners: Male   Other Topics Concern  . Not on file   Social History Narrative   Lives with husband, Hilbert Odor at home   Caffeine use: Drinks coffee/tea/caffeine free soda     PHYSICAL EXAM   GENERAL EXAM/CONSTITUTIONAL: Vitals:  Filed Vitals:   11/13/15 1123  Height: 5\' 6"  (1.676 m)  Weight: 218 lb (98.884 kg)     Orthostatic VS for the past 24 hrs (Last 3 readings):  BP- Lying Pulse- Lying BP- Sitting Pulse- Sitting BP- Standing at 3  minutes Pulse- Standing at 3 minutes  11/13/15 1127 145/70 mmHg 68 130/68 mmHg 71 117/70 mmHg 79    Body mass index is 35.2 kg/(m^2).  Visual Acuity Screening   Right eye Left eye Both eyes  Without correction: 20/70 20/70 20/50   With correction:        Patient is in no distress; well developed, nourished and groomed;   NECK HAS DECR ROM  MASKED FACIES  SOFT VOICE  CARDIOVASCULAR:  Examination of carotid arteries is normal; no carotid bruits  Regular rate and rhythm, no murmurs  Examination of peripheral vascular system by observation and palpation is normal  EYES:  Ophthalmoscopic exam of optic discs and posterior segments is normal; no papilledema or hemorrhages  MUSCULOSKELETAL:  Gait, strength, tone, movements noted in Neurologic exam below  NEUROLOGIC: MENTAL STATUS:  No flowsheet data found.  awake, alert, oriented to person, place and time  DECR recent and remote memory intact  normal attention and concentration  language fluent, comprehension intact, naming intact,   fund of knowledge appropriate  CRANIAL NERVE:   2nd - no papilledema on fundoscopic exam  2nd, 3rd, 4th, 6th - pupils equal and reactive to light, visual fields full to confrontation, extraocular muscles intact, no nystagmus  5th - facial sensation symmetric  7th - facial  strength symmetric  8th - hearing intact  9th - palate elevates symmetrically, uvula midline  11th - shoulder shrug symmetric  12th - tongue protrusion midline  MOTOR:   POSTURAL AND ACTION TREMOR IN BUE (LEFT WORSE THAN RIGHT)  RARE RESTING TREMOR IN LUE WITH CONTRALATERAL FINGER TAPPING  BRADYKINESIA IN BUE AND FOOT TAPPING  normal bulk, full strength in the BUE, BLE; EXCEPT HIP FLEX 4+ BILATERALLY  SENSORY:   normal and symmetric to light touch; ABSENT VIB AT TOES  COORDINATION:   finger-nose-finger, fine finger movements SLOW BILATERALLY  REFLEXES:   deep tendon reflexes RUE 2, LUE 3; KNEES 3; ANKLES TRACE  POSITIVE SNOUT AND MYERSON'S REFLEXES  GAIT/STATION:   SLOW TO RISE; SHORT STEPS; UNSTEADY    DIAGNOSTIC DATA (LABS, IMAGING, TESTING) - I reviewed patient records, labs, notes, testing and imaging myself where available.  Lab Results  Component Value Date   WBC 7.1 02/27/2015   HGB 11.9* 02/27/2015   HCT 38.3 02/27/2015   MCV 84.7 02/27/2015   PLT 240 02/27/2015      Component Value Date/Time   NA 142 02/27/2015 1051   K 4.2 02/27/2015 1051   CL 103 02/27/2015 1051   CO2 29 02/27/2015 1051   GLUCOSE 102* 02/27/2015 1051   BUN 32* 02/27/2015 1051   CREATININE 2.03* 02/27/2015 1051   CREATININE 1.71* 09/08/2014 1132   CALCIUM 9.5 02/27/2015 1051   PROT 7.7 12/02/2013 2009   ALBUMIN 3.6 12/02/2013 2009   AST 44* 12/02/2013 2009   ALT 36* 12/02/2013 2009   ALKPHOS 64 12/02/2013 2009   BILITOT 0.4 12/02/2013 2009   GFRNONAA 27* 12/02/2013 2009   GFRAA 32* 12/02/2013 2009   Lab Results  Component Value Date   CHOL  05/27/2010    168        ATP III CLASSIFICATION:  <200     mg/dL   Desirable  200-239  mg/dL   Borderline High  >=240    mg/dL   High          HDL 51 05/27/2010   LDLCALC  05/27/2010    98        Total Cholesterol/HDL:CHD Risk  Coronary Heart Disease Risk Table                     Men   Women  1/2 Average Risk   3.4    3.3  Average Risk       5.0   4.4  2 X Average Risk   9.6   7.1  3 X Average Risk  23.4   11.0        Use the calculated Patient Ratio above and the CHD Risk Table to determine the patient's CHD Risk.        ATP III CLASSIFICATION (LDL):  <100     mg/dL   Optimal  100-129  mg/dL   Near or Above                    Optimal  130-159  mg/dL   Borderline  160-189  mg/dL   High  >190     mg/dL   Very High   TRIG 95 05/27/2010   CHOLHDL 3.3 05/27/2010   Lab Results  Component Value Date   HGBA1C * 05/26/2010    5.8 (NOTE)                                                                       According to the ADA Clinical Practice Recommendations for 2011, when HbA1c is used as a screening test:   >=6.5%   Diagnostic of Diabetes Mellitus           (if abnormal result  is confirmed)  5.7-6.4%   Increased risk of developing Diabetes Mellitus  References:Diagnosis and Classification of Diabetes Mellitus,Diabetes [id number] 1):S62-S69 and Standards of Medical Care in         Diabetes - 2011,Diabetes Care,2011,34  (Suppl 1):S11-S61.   No results found for: VITAMINB12 Lab Results  Component Value Date   TSH 0.683 02/14/2011    02/04/14 TTE  - Low normal left ventricular systolic and mildly reduced rightventricular function. This might be underestimated by rapidventricular rate.  12/03/13 CT chest [I reviewed images myself and agree with interpretation. -VRP]  1. Mild bibasilar atelectasis noted, more prominent on the right. Minimal hazy opacity at the anterior right upper lobe and inferior aspect of the left upper lobe likely also reflects atelectasis. Infection is thought to be less likely. 2. Dense calcification at the mitral valve. 3. Scattered coronary artery calcifications seen. 4. Apparent mild prominence of the pulmonary arteries, which could reflect mild pulmonary arterial hypertension. 5. Likely small bilateral renal cysts seen. 6. Tiny hiatal hernia noted. 7. Stable  chronic loss of height at vertebral body T12.    ASSESSMENT AND PLAN  82 y.o. year old female here with 2 types of dizziness sensations. The first type consistent orthostatic lightheadedness associated with orthostatic hypotension. The second type consistent of balance and walking problem. Neurologic examination notable for orthostatic hypotension on exam today (30 point drop in systolic pressure with moving from laying to standing, with inadequate compensatory tachycardia). Also patient has subtle resting tremor, bradykinesia, postural instability. Findings suspicious for parkinsonism (multiple system atrophy versus idiopathic Parkinson's disease). Patient also has subtle hyperreflexia in the left upper and bilateral lower extremities, associated with neck pain, raising possibility  of cervical myelopathy. We'll check further evaluation for causes of neurogenic orthostatic hypotension, autonomic neuropathy, cervical myelopathy or other CNS causes.  Ddx: autonomic neuropathy, neurogenic orthostatic hypotension, parkinsonism (MSA-a vs parkinson's disease), cervical myelopathy   1. Orthostatic hypotension   2. Dizziness and giddiness   3. Gait difficulty   4. Parkinsonism, unspecified Parkinsonism type ([place])   5. Neck pain   6. Hyperreflexia   7. Persistent atrial fibrillation ([place])   8. Chronic anticoagulation     PLAN: - MRI brain and cervical spine - check neuropathy labs - PT evaluation - use rollator walker or cane - caution with gait, walking, driving - may consider droxidopa (northera) for neurogenic orthostatic hypotension  Orders Placed This Encounter  Procedures  . MR Brain Wo Contrast  . MR Cervical Spine Wo Contrast  . Neuropathy Panel  . Ambulatory referral to Physical Therapy   Return in about 6 weeks (around 12/25/2015).  I reviewed images, labs, notes, records myself. I summarized findings and reviewed with patient, for this high risk condition (orthostatic  hypotension, gait difficulty, chronic anticoagulation) requiring high complexity decision making.    Penni Bombard, MD 0000000, [id number] PM Certified in Neurology, Neurophysiology and Neuroimaging  [facility] [address] [place], [place] [address] [phone]

## 2015-11-13 NOTE — Patient Instructions (Addendum)
Thank you for coming to see [place] at [facility]. I hope we have been able to provide you high quality care today.  You may receive a patient satisfaction survey over the next few weeks. We would appreciate your feedback and comments so that we may continue to improve ourselves and the health of our patients.  - I will check MRI brain and cervical spine - I will check labs - I will setup physical therapy   ~~~~~~~~~~~~~~~~~~~~~~~~~~~~~~~~~~~~~~~~~~~~~~~~~~~~~~~~~~~~~~~~~  DR. PENUMALLI'S GUIDE TO HAPPY AND HEALTHY LIVING These are some of my general health and wellness recommendations. Some of them may apply to you better than others. Please use common sense as you try these suggestions and feel free to ask me any questions.   ACTIVITY/FITNESS Mental, social, emotional and physical stimulation are very important for brain and body health. Try learning a new activity (arts, music, language, sports, games).  Keep moving your body to the best of your abilities. You can do this at home, inside or outside, the park, community center, gym or anywhere you like. Consider a physical therapist or personal trainer to get started. Consider the app Sworkit. Fitness trackers such as smart-watches, smart-phones or Fitbits can help as well.   NUTRITION Eat more plants: colorful vegetables, nuts, seeds and berries.  Eat less sugar, salt, preservatives and processed foods.  Avoid toxins such as cigarettes and alcohol.  Drink water when you are thirsty. Warm water with a slice of lemon is an excellent morning drink to start the day.  Consider these websites for more information The Nutrition Source (https://www.henry-hernandez.biz/) Precision Nutrition (WindowBlog.ch)   RELAXATION Consider practicing mindfulness meditation or other relaxation techniques such as deep breathing, prayer, yoga, tai chi, massage. See website mindful.org or the  apps Headspace or Calm to help get started.   SLEEP Try to get at least 7-8+ hours sleep per day. Regular exercise and reduced caffeine will help you sleep better. Practice good sleep hygeine techniques. See website sleep.org for more information.   PLANNING Prepare estate planning, living will, healthcare POA documents. Sometimes this is best planned with the help of an attorney. Theconversationproject.org and agingwithdignity.org are excellent resources.

## 2015-11-16 LAB — NEUROPATHY PANEL
A/G Ratio: 1 (ref 0.7–1.7)
ALPHA 1: 0.2 g/dL (ref 0.0–0.4)
ANA: NEGATIVE
ANGIO CONVERT ENZYME: 47 U/L (ref 14–82)
Albumin ELP: 3.6 g/dL (ref 2.9–4.4)
Alpha 2: 1 g/dL (ref 0.4–1.0)
Beta: 1.6 g/dL — ABNORMAL HIGH (ref 0.7–1.3)
GAMMA GLOBULIN: 0.8 g/dL (ref 0.4–1.8)
GLOBULIN, TOTAL: 3.6 g/dL (ref 2.2–3.9)
SED RATE: 22 mm/h (ref 0–40)
TSH: 1.4 u[IU]/mL (ref 0.450–4.500)
Total Protein: 7.2 g/dL (ref 6.0–8.5)
VIT D 25 HYDROXY: 54.5 ng/mL (ref 30.0–100.0)
Vitamin B-12: 332 pg/mL (ref 211–946)

## 2015-11-17 ENCOUNTER — Telehealth: Payer: Self-pay | Admitting: *Deleted

## 2015-11-17 DIAGNOSIS — I10 Essential (primary) hypertension: Secondary | ICD-10-CM | POA: Diagnosis not present

## 2015-11-17 DIAGNOSIS — E785 Hyperlipidemia, unspecified: Secondary | ICD-10-CM | POA: Diagnosis not present

## 2015-11-17 DIAGNOSIS — R42 Dizziness and giddiness: Secondary | ICD-10-CM | POA: Diagnosis not present

## 2015-11-17 DIAGNOSIS — N184 Chronic kidney disease, stage 4 (severe): Secondary | ICD-10-CM | POA: Diagnosis not present

## 2015-11-17 DIAGNOSIS — Z6841 Body Mass Index (BMI) 40.0 and over, adult: Secondary | ICD-10-CM | POA: Diagnosis not present

## 2015-11-17 DIAGNOSIS — N2581 Secondary hyperparathyroidism of renal origin: Secondary | ICD-10-CM | POA: Diagnosis not present

## 2015-11-17 NOTE — Telephone Encounter (Signed)
LVM for patient informing her per Dr Leta Baptist, her lab results are unremarkable. Reminded her of her upcoming MRIs and FU. Left name, number and advised office is now closed.

## 2015-11-22 DIAGNOSIS — M542 Cervicalgia: Secondary | ICD-10-CM | POA: Diagnosis not present

## 2015-11-22 DIAGNOSIS — R42 Dizziness and giddiness: Secondary | ICD-10-CM | POA: Diagnosis not present

## 2015-11-22 DIAGNOSIS — R292 Abnormal reflex: Secondary | ICD-10-CM | POA: Diagnosis not present

## 2015-11-22 DIAGNOSIS — R269 Unspecified abnormalities of gait and mobility: Secondary | ICD-10-CM | POA: Diagnosis not present

## 2015-11-22 DIAGNOSIS — I951 Orthostatic hypotension: Secondary | ICD-10-CM | POA: Diagnosis not present

## 2015-11-23 ENCOUNTER — Ambulatory Visit
Admission: RE | Admit: 2015-11-23 | Discharge: 2015-11-23 | Disposition: A | Discharge status: Home / Self Care | Payer: Medicare Other | Source: Ambulatory Visit | Attending: Diagnostic Neuroimaging | Admitting: Diagnostic Neuroimaging

## 2015-11-23 DIAGNOSIS — R42 Dizziness and giddiness: Secondary | ICD-10-CM

## 2015-11-23 DIAGNOSIS — M542 Cervicalgia: Secondary | ICD-10-CM | POA: Diagnosis not present

## 2015-11-23 DIAGNOSIS — R269 Unspecified abnormalities of gait and mobility: Secondary | ICD-10-CM

## 2015-11-23 DIAGNOSIS — I951 Orthostatic hypotension: Secondary | ICD-10-CM

## 2015-11-23 DIAGNOSIS — R292 Abnormal reflex: Secondary | ICD-10-CM

## 2015-11-28 ENCOUNTER — Ambulatory Visit: Payer: Medicare Other | Attending: Diagnostic Neuroimaging | Admitting: Physical Therapy

## 2015-11-28 ENCOUNTER — Telehealth: Payer: Self-pay | Admitting: *Deleted

## 2015-11-28 VITALS — BP 146/73 | HR 69

## 2015-11-28 DIAGNOSIS — R42 Dizziness and giddiness: Secondary | ICD-10-CM | POA: Diagnosis not present

## 2015-11-28 DIAGNOSIS — R197 Diarrhea, unspecified: Secondary | ICD-10-CM | POA: Diagnosis not present

## 2015-11-28 NOTE — Telephone Encounter (Signed)
Received call back from patient. Informed her Dr Leta Baptist is out of office, but Dr Dohmeier reviewed her MRI brain and cervical spine. Informed her that her MRI brain showed no acute changes but some mild temporal shrinkage. Advised this can be seen in memory loss disorders, but it does not diagnose her with anything new. Informed her that MRI cervical spine showed degenerative disk disease in her neck, and when Dr Leta Baptist returns he will review her scans further. She will be advised  If he has any further recommendations or referals. She verbalized understanding, appreciation.

## 2015-11-28 NOTE — Telephone Encounter (Signed)
LVM requesting call back to give MRI results. Left name, number.

## 2015-12-03 NOTE — Therapy (Signed)
[facility] [address] [place], [place], [address] Phone: [phone]   Fax:  [phone]  Physical Therapy Evaluation  Patient Details  Name: THEIA FABER MRN: [id number] Date of Birth: 1931-05-17 Referring Provider: Dr. Andrey Spearman  Encounter Date: 11/28/2015      PT End of Session - 12/03/15 1344    Visit Number 1   Number of Visits 1   Authorization Type Medicare   Authorization Time Period 11-28-15 - 01-27-16   PT Start Time 1315   PT Stop Time 1400   PT Time Calculation (min) 45 min      Past Medical History:  Diagnosis Date  . Arthritis 08-28-11   right knee pain-surgery planned  . Cancer [facility]) 08-28-11   Melonoma-cheek(many Yrs ago) , recent bx. left  face lesion, past skin cancer lesions  . Complication of anesthesia 08-28-11   in past arrythmia-  cardiology has seen in past  . Dysrhythmia 08-28-11   only immed.  to several days after surgery- hx. A.fib. in past  . GERD (gastroesophageal reflux disease) 08-28-11   tx. omeprazole  . Hypertension 08-28-11   tx. meds  . IBS (irritable bowel syndrome)   . Neuromuscular disorder ([place]) 08-28-11   hx. Polio age 36-slight residual affects legs  . Swelling of extremity 08-28-11   bilateral lower extremities- mid calf down    Past Surgical History:  Procedure Laterality Date  . ABDOMINAL HYSTERECTOMY  08-28-11   Partial-vaginal approach  . BUNIONECTOMY  08-28-11   right  . CARDIOVERSION N/A 08/05/2014   Procedure: CARDIOVERSION;  Surgeon: Larey Dresser, MD;  Location: [facility];  Service: Cardiovascular;  Laterality: N/A;  . CATARACT EXTRACTION, BILATERAL  08-28-11   bilateral  . EYE SURGERY     Cataract  . JOINT REPLACEMENT  Oct. 1, 2012   Right Knee  . KNEE ARTHROSCOPY  09/06/2011   Procedure: ARTHROSCOPY KNEE;  Surgeon: Gearlean Alf, MD;  Location: [facility];  Service: Orthopedics;  Laterality: Right;  with Synovectomy    Vitals:   11/28/15 1405  BP: (!) 146/73  Pulse: 69         Subjective Assessment - 12/03/15 1336    Subjective Pt reports dizziness/lightheadedness with sit to stand and with prolonged standing/ambulating - episodes especially occur at church during which she usually has to stand for prolonged periods of time   Patient Stated Goals ?? - reduce vertigo if possible - "I don't know - I was here before and it didn't help"   Currently in Pain? No/denies            [facility] PT Assessment - 12/03/15 0001      Assessment   Medical Diagnosis Dizziness;  Orthostatic hypotension   Referring Provider Dr. Andrey Spearman   Onset Date/Surgical Date --  Oct. 2015   Prior Therapy at this facility in Jan. 2016     Precautions   Precautions Other (comment)  Vertigo     Balance Screen   Has the patient fallen in the past 6 months No   Has the patient had a decrease in activity level because of a fear of falling?  No   Is the patient reluctant to leave their home because of a fear of falling?  Yes     Prior Function   Level of Independence Independent            Vestibular Assessment - 12/03/15 0001      Vestibular  Assessment   General Observation Pt is an 82 year old lady with c/o light-headedness that started almost 2 yrs ago     Symptom Behavior   Type of Dizziness Lightheadedness   Frequency of Dizziness daily   Relieving Factors Lying supine     Occulomotor Exam   Smooth Pursuits Intact   Saccades Intact     Positional Testing   Sidelying Test Sidelying Right;Sidelying Left     Sidelying Right   Sidelying Right Duration none   Sidelying Right Symptoms Other (comment)  severe light headedness with return to upright     Sidelying Left   Sidelying Left Duration none   Sidelying Left Symptoms Other (comment)  c/o light-headedness with return to upright     Orthostatics   BP supine (x 5 minutes) 156/73   HR supine (x 5 minutes) 60   BP sitting 149/80   HR sitting 63   BP  standing (after 1 minute) 137/79   HR standing (after 1 minute) 70   BP standing (after 3 minutes) 135/78   HR standing (after 3 minutes) 71                                   Plan - 12/03/15 1345    Clinical Impression Statement Pt is an 82 year old lady with c/o light-headedness that occurs with transferring from supine to sit and with sit to stand.  Pt denies any tru room spinning vertigo:  pt also reports having severe coughing spells in which she says she coughs up tremendous amounts of mucous and is embarassed to go out for fear that she will have a coughing spell.  Pt denies pain and reports balance is affected by her light-headedness.  Pt's c/o's light-headedness do not appear to be of vestibular system etiology but rather related to orthostatic hypotension.     Rehab Potential Fair   PT Frequency One time visit   PT Duration Other (comment)  eval only - pt wishes to follow up with MD   PT Treatment/Interventions Other (comment)  PT evaluation   PT Next Visit Plan N/A   Recommended Other Services pt to follow up with MD regarding severe coughing spells and light-headedness   Consulted and Agree with Plan of Care Patient      Patient will benefit from skilled therapeutic intervention in order to improve the following deficits and impairments:  Dizziness  Visit Diagnosis: Dizziness and giddiness - Plan: PT plan of care cert/re-cert     Problem List Patient Active Problem List   Diagnosis Date Noted  . DOE (dyspnea on exertion) 03/06/2015  . Bradycardia 08/23/2014  . Essential hypertension 08/23/2014  . Lightheadedness 08/23/2014  . Fatigue 06/22/2014  . Atrial fibrillation ([place]) 01/27/2014  . Chronic kidney disease 01/27/2014  . Swelling of limb 04/01/2012  . Edema leg 04/01/2012    Alda Lea, PT 12/03/2015, 1:54 PM  [facility] [address] [place], [place],  [address] Phone: [phone]   Fax:  [phone]  Name: MAEVERY WASMUND MRN: [id number] Date of Birth: Sep 25, 1931

## 2015-12-08 DIAGNOSIS — N184 Chronic kidney disease, stage 4 (severe): Secondary | ICD-10-CM | POA: Diagnosis not present

## 2015-12-08 DIAGNOSIS — R05 Cough: Secondary | ICD-10-CM | POA: Diagnosis not present

## 2015-12-08 DIAGNOSIS — I480 Paroxysmal atrial fibrillation: Secondary | ICD-10-CM | POA: Diagnosis not present

## 2015-12-08 DIAGNOSIS — J209 Acute bronchitis, unspecified: Secondary | ICD-10-CM | POA: Diagnosis not present

## 2015-12-10 ENCOUNTER — Ambulatory Visit (HOSPITAL_COMMUNITY): Payer: Medicare Other

## 2015-12-10 ENCOUNTER — Ambulatory Visit (INDEPENDENT_AMBULATORY_CARE_PROVIDER_SITE_OTHER)
Admission: EM | Admit: 2015-12-10 | Discharge: 2015-12-10 | Disposition: A | Discharge status: Home / Self Care | Payer: Medicare Other | Source: Home / Self Care | Attending: Emergency Medicine | Admitting: Emergency Medicine

## 2015-12-10 ENCOUNTER — Encounter (HOSPITAL_COMMUNITY): Payer: Self-pay | Admitting: Emergency Medicine

## 2015-12-10 DIAGNOSIS — Z7901 Long term (current) use of anticoagulants: Secondary | ICD-10-CM | POA: Insufficient documentation

## 2015-12-10 DIAGNOSIS — R918 Other nonspecific abnormal finding of lung field: Secondary | ICD-10-CM | POA: Insufficient documentation

## 2015-12-10 DIAGNOSIS — I700 Atherosclerosis of aorta: Secondary | ICD-10-CM | POA: Insufficient documentation

## 2015-12-10 DIAGNOSIS — K589 Irritable bowel syndrome without diarrhea: Secondary | ICD-10-CM

## 2015-12-10 DIAGNOSIS — K219 Gastro-esophageal reflux disease without esophagitis: Secondary | ICD-10-CM

## 2015-12-10 DIAGNOSIS — Z8249 Family history of ischemic heart disease and other diseases of the circulatory system: Secondary | ICD-10-CM

## 2015-12-10 DIAGNOSIS — R05 Cough: Secondary | ICD-10-CM

## 2015-12-10 DIAGNOSIS — I509 Heart failure, unspecified: Secondary | ICD-10-CM

## 2015-12-10 DIAGNOSIS — I110 Hypertensive heart disease with heart failure: Secondary | ICD-10-CM

## 2015-12-10 DIAGNOSIS — R059 Cough, unspecified: Secondary | ICD-10-CM

## 2015-12-10 DIAGNOSIS — Z79899 Other long term (current) drug therapy: Secondary | ICD-10-CM | POA: Insufficient documentation

## 2015-12-10 DIAGNOSIS — Z85828 Personal history of other malignant neoplasm of skin: Secondary | ICD-10-CM

## 2015-12-10 LAB — COMPREHENSIVE METABOLIC PANEL
ALK PHOS: 83 U/L (ref 38–126)
ALT: 21 U/L (ref 14–54)
ANION GAP: 6 (ref 5–15)
AST: 32 U/L (ref 15–41)
Albumin: 3.3 g/dL — ABNORMAL LOW (ref 3.5–5.0)
BILIRUBIN TOTAL: 0.9 mg/dL (ref 0.3–1.2)
BUN: 23 mg/dL — ABNORMAL HIGH (ref 6–20)
CALCIUM: 9.5 mg/dL (ref 8.9–10.3)
CO2: 28 mmol/L (ref 22–32)
Chloride: 102 mmol/L (ref 101–111)
Creatinine, Ser: 1.89 mg/dL — ABNORMAL HIGH (ref 0.44–1.00)
GFR, EST AFRICAN AMERICAN: 27 mL/min — AB (ref >60)
GFR, EST NON AFRICAN AMERICAN: 24 mL/min — AB (ref >60)
GLUCOSE: 112 mg/dL — AB (ref 65–99)
Potassium: 4 mmol/L (ref 3.5–5.1)
Sodium: 136 mmol/L (ref 135–145)
TOTAL PROTEIN: 7.6 g/dL (ref 6.5–8.1)

## 2015-12-10 LAB — CBC WITH DIFFERENTIAL/PLATELET
BASOS PCT: 0 %
Basophils Absolute: 0 10*3/uL (ref 0.0–0.1)
Eosinophils Absolute: 0.2 10*3/uL (ref 0.0–0.7)
Eosinophils Relative: 3 %
HEMATOCRIT: 38.4 % (ref 36.0–46.0)
HEMOGLOBIN: 11.6 g/dL — AB (ref 12.0–15.0)
LYMPHS ABS: 2.8 10*3/uL (ref 0.7–4.0)
LYMPHS PCT: 33 %
MCH: 24.8 pg — AB (ref 26.0–34.0)
MCHC: 30.2 g/dL (ref 30.0–36.0)
MCV: 82.2 fL (ref 78.0–100.0)
MONO ABS: 0.9 10*3/uL (ref 0.1–1.0)
MONOS PCT: 10 %
NEUTROS ABS: 4.6 10*3/uL (ref 1.7–7.7)
NEUTROS PCT: 54 %
Platelets: 257 10*3/uL (ref 150–400)
RBC: 4.67 MIL/uL (ref 3.87–5.11)
RDW: 16.1 % — AB (ref 11.5–15.5)
WBC: 8.5 10*3/uL (ref 4.0–10.5)

## 2015-12-10 MED ORDER — ONDANSETRON 4 MG PO TBDP
4.0000 mg | ORAL_TABLET | Freq: Once | ORAL | Status: AC
  Administered 2015-12-10: 4 mg via ORAL

## 2015-12-10 MED ORDER — HYDROCOD POLST-CPM POLST ER 10-8 MG/5ML PO SUER: 5.0000 mL | Freq: Two times a day (BID) | ORAL | 0 refills | Status: DC

## 2015-12-10 MED ORDER — ACETAMINOPHEN 325 MG PO TABS
650.0000 mg | ORAL_TABLET | Freq: Once | ORAL | Status: AC
  Administered 2015-12-10: 650 mg via ORAL

## 2015-12-10 MED ORDER — ACETAMINOPHEN 325 MG PO TABS
ORAL_TABLET | ORAL | Status: AC
  Filled 2015-12-10: qty 2

## 2015-12-10 MED ORDER — ONDANSETRON 4 MG PO TBDP
ORAL_TABLET | ORAL | Status: AC
  Filled 2015-12-10: qty 1

## 2015-12-10 MED ORDER — FUROSEMIDE 40 MG PO TABS: 40.0000 mg | ORAL_TABLET | Freq: Every day | ORAL | 0 refills | Status: DC

## 2015-12-10 NOTE — ED Triage Notes (Signed)
Patient saw pcp on Friday and was prescribed generic augmentin and benzonatate.  Patient reports feling worse since Friday and starting medicine.  Patient feels worse and has developed nausea and a terrible headache.  Patient says her cough medicine is not helping and she coughs all night.  Patient had a chest xray 3 weeks ago that was "clear" and pcp wants her to have a chest xray tomorrow.

## 2015-12-10 NOTE — ED Provider Notes (Signed)
CSN: [id number]     Arrival date & time 12/10/15  1200 History   None    Chief Complaint  Patient presents with  . Cough   (Consider location/radiation/quality/duration/timing/severity/associated sxs/prior Treatment) The history is provided by the patient. No language interpreter was used.  Cough  Cough characteristics:  Productive Sputum characteristics:  Clear Severity:  Moderate Onset quality:  Gradual Duration:  1 week Timing:  Constant Progression:  Worsening Chronicity:  New Smoker: no   Context: upper respiratory infection   Relieved by:  Nothing Worsened by:  Nothing Ineffective treatments:  None tried Associated symptoms: no shortness of breath and no sinus congestion   Risk factors: no recent infection   Pt complains of a continued cough.  No relief with augmentin and tessalon perles.   Past Medical History:  Diagnosis Date  . Arthritis 08-28-11   right knee pain-surgery planned  . Cancer [facility]) 08-28-11   Melonoma-cheek(many Yrs ago) , recent bx. left  face lesion, past skin cancer lesions  . Complication of anesthesia 08-28-11   in past arrythmia-  cardiology has seen in past  . Dysrhythmia 08-28-11   only immed.  to several days after surgery- hx. A.fib. in past  . GERD (gastroesophageal reflux disease) 08-28-11   tx. omeprazole  . Hypertension 08-28-11   tx. meds  . IBS (irritable bowel syndrome)   . Neuromuscular disorder ([place]) 08-28-11   hx. Polio age 58-slight residual affects legs  . Swelling of extremity 08-28-11   bilateral lower extremities- mid calf down   Past Surgical History:  Procedure Laterality Date  . ABDOMINAL HYSTERECTOMY  08-28-11   Partial-vaginal approach  . BUNIONECTOMY  08-28-11   right  . CARDIOVERSION N/A 08/05/2014   Procedure: CARDIOVERSION;  Surgeon: Larey Dresser, MD;  Location: [facility];  Service: Cardiovascular;  Laterality: N/A;  . CATARACT EXTRACTION, BILATERAL  08-28-11   bilateral  . EYE SURGERY     Cataract  . JOINT  REPLACEMENT  Oct. 1, 2012   Right Knee  . KNEE ARTHROSCOPY  09/06/2011   Procedure: ARTHROSCOPY KNEE;  Surgeon: Gearlean Alf, MD;  Location: [facility];  Service: Orthopedics;  Laterality: Right;  with Synovectomy   Family History  Problem Relation Age of Onset  . Cancer Mother   . Heart disease Mother     Varicose Veins  . Hyperlipidemia Mother   . Hypertension Mother   . Heart disease Father     Heart Disease before age 30  . Hypertension Father   . Cancer Sister   . Stroke Maternal Grandmother   . Heart attack Brother    Social History  Substance Use Topics  . Smoking status: Never Smoker  . Smokeless tobacco: Never Used  . Alcohol use No   OB History    No data available     Review of Systems  Respiratory: Positive for cough. Negative for shortness of breath.   All other systems reviewed and are negative.   Allergies  Hydromorphone; Codeine; Boniva [ibandronic acid]; and Lisinopril  Home Medications   Prior to Admission medications   Medication Sig Start Date End Date Taking? Authorizing Provider  acetaminophen (TYLENOL) 500 MG tablet Take 1,000 mg by mouth every 8 (eight) hours as needed for moderate pain (pain in legs).   Yes Historical Provider, MD  amoxicillin-clavulanate (AUGMENTIN) 500-125 MG tablet Take 1 tablet by mouth 3 (three) times daily.   Yes Historical Provider, MD  benzonatate (TESSALON) 200 MG capsule Take 200 mg  by mouth 3 (three) times daily as needed for cough.   Yes Historical Provider, MD  amiodarone (PACERONE) 200 MG tablet TAKE 1 TABLET (200 MG TOTAL) BY MOUTH DAILY. 10/31/15   Jettie Booze, MD  amLODipine (NORVASC) 2.5 MG tablet Take 1 tablet (2.5 mg total) by mouth daily. 02/03/15   Jettie Booze, MD  chlorpheniramine-HYDROcodone [facility]) 10-8 MG/5ML SUER Take 5 mLs by mouth 2 (two) times daily. 12/10/15   Fransico Meadow, PA-C  Cholecalciferol (VITAMIN D3) 2000 UNITS capsule Take 2,000 Units by mouth daily.     Historical Provider, MD  ELIQUIS 2.5 MG TABS tablet TAKE 1 TABLET (2.5 MG TOTAL) BY MOUTH 2 (TWO) TIMES DAILY. 08/30/15   Jettie Booze, MD  fexofenadine (ALLEGRA) 180 MG tablet Take 180 mg by mouth daily.    Historical Provider, MD  fluticasone (FLONASE) 50 MCG/ACT nasal spray Place 1 spray into both nostrils daily as needed for allergies.  09/27/13   Historical Provider, MD  latanoprost (XALATAN) 0.005 % ophthalmic solution Place 1 drop into both eyes at bedtime. 08/11/14   Historical Provider, MD  meclizine (ANTIVERT) 25 MG tablet Take 12.5 mg by mouth daily as needed for dizziness.  04/01/14   Historical Provider, MD  omeprazole (PRILOSEC) 20 MG capsule Take 20 mg by mouth daily.    Historical Provider, MD  OVER THE COUNTER MEDICATION Take 1 tablet by mouth daily. Move Free Ultra    Historical Provider, MD  pantoprazole (PROTONIX) 40 MG tablet Take 40 mg by mouth daily. 11/01/15   Historical Provider, MD  tetrahydrozoline 0.05 % ophthalmic solution Place 1 drop into both eyes 2 (two) times daily.    Historical Provider, MD   Meds Ordered and Administered this Visit   Medications  ondansetron (ZOFRAN-ODT) disintegrating tablet 4 mg (4 mg Oral Given 12/10/15 1410)  acetaminophen (TYLENOL) tablet 650 mg (650 mg Oral Given 12/10/15 1409)    BP 152/76   Pulse 66   Temp 98.1 F (36.7 C) (Oral)   Resp 22   SpO2 96%  No data found.   Physical Exam  Constitutional: She appears well-developed and well-nourished. No distress.  HENT:  Head: Normocephalic and atraumatic.  Eyes: Conjunctivae are normal.  Neck: Neck supple.  Cardiovascular: Normal rate, regular rhythm, normal heart sounds and intact distal pulses.   No murmur heard. Pulmonary/Chest: Effort normal and breath sounds normal. No respiratory distress. She exhibits no tenderness.  Abdominal: Soft. She exhibits no mass. There is no tenderness.  Musculoskeletal: She exhibits no edema.  Neurological: She is alert.  Skin: Skin is  warm and dry.  Psychiatric: She has a normal mood and affect.  Nursing note and vitals reviewed.   Urgent Care Course   Clinical Course  Value Comment By Time  BUN: (!) 23 (Reviewed) Fransico Meadow, PA-C 07/30 1442    Procedures (including critical care time)  Labs Review Labs Reviewed  CBC WITH DIFFERENTIAL/PLATELET - Abnormal; Notable for the following:       Result Value   Hemoglobin 11.6 (*)    MCH 24.8 (*)    RDW 16.1 (*)    All other components within normal limits  COMPREHENSIVE METABOLIC PANEL - Abnormal; Notable for the following:    Glucose, Bld 112 (*)    BUN 23 (*)    Creatinine, Ser 1.89 (*)    Albumin 3.3 (*)    GFR calc non Af Amer 24 (*)    GFR calc Af Wyvonnia Lora  27 (*)    All other components within normal limits    Imaging Review Dg Chest 2 View  Result Date: 10-16-2015 CLINICAL DATA:  Cardiac care. Productive cough for 1 week. Decreased sleep. EXAM: CHEST  2 VIEW COMPARISON:  Two-view chest x-ray 10/25/2015. FINDINGS: Mild cardiomegaly is present. Increased interstitial markings are present since the prior exam. Increased pulmonary vascular congestion is noted. Atherosclerotic changes are noted within the aortic arch. Left basilar airspace disease likely reflects atelectasis. There is no effusion. Exaggerated thoracic kyphosis and remote compression fractures are stable. IMPRESSION: 1. Borderline cardiomegaly and increasing interstitial prominence suggesting edema. 2. New left lower lobe airspace disease. While this likely reflects atelectasis, infection is also considered. 3. Atherosclerosis. Electronically Signed   By: San Morelle M.D.   On: 006-09-2015 14:32    Visual Acuity Review  Right Eye Distance:   Left Eye Distance:   Bilateral Distance:    Right Eye Near:   Left Eye Near:    Bilateral Near:         MDM  Pt counseled on her xray.  Pt advised to take 40 mg of lasix today.  Pt advised to see her Md tomorrow.  Pt agrees with paln.     1. Cough   2. Chronic congestive heart failure, unspecified congestive heart failure type [facility])        Fransico Meadow, PA-C 12/10/15 2150

## 2015-12-10 NOTE — ED Notes (Signed)
Patient sent for xray  Patient going to hospital for radiology services

## 2015-12-12 ENCOUNTER — Telehealth: Payer: Self-pay | Admitting: Interventional Cardiology

## 2015-12-12 DIAGNOSIS — J189 Pneumonia, unspecified organism: Secondary | ICD-10-CM

## 2015-12-12 HISTORY — DX: Pneumonia, unspecified organism: J18.9

## 2015-12-12 NOTE — Telephone Encounter (Signed)
The pt had CXR and CMET on 7/30 while at Urgent care. She states that her PCP has called her in some medicine and that she now has an appt with him tomorrow due to her s/s. She thanked me for calling her back.

## 2015-12-12 NOTE — Telephone Encounter (Addendum)
The pt got on the phone and gave me permission to speak to her daughter in law concerning her s/s.  Cherene Julian, the pts daughter in law states that the pt was seen in Urgent care on 7/30 for cough.  Per Cherene Julian and notes from Urgent care visit in [facility]  the Pt was advised to take 40 mg of lasix today and to f/u with her MD on 7/31.  Cherene Julian is concerned because the pt is nauseated, has no appetite and no energy and wants to know what they should do.  I have advised them to contact the pts PCP and kidney MD  as well to make them aware and to see if they have any recommendations.  She is aware that I will call her back with Dr Hassell Done recommendations. Please advise.

## 2015-12-12 NOTE — Telephone Encounter (Signed)
Cherene Julian ( Daughter-in-Law) is calling because Heather Benson went to Urgent  Care and they told her she had fluid around her heart and they gave her some fluid pills and told her take two for today , but she says she has no energy , no appetite and is feeling nausea.  Please call     Thanks

## 2015-12-12 NOTE — Telephone Encounter (Signed)
OK to check a Chest xray, BMet and BNP for SHOB, cough.

## 2015-12-13 ENCOUNTER — Inpatient Hospital Stay (HOSPITAL_COMMUNITY)
Admission: EM | Admit: 2015-12-13 | Discharge: 2015-12-15 | DRG: 193 | Disposition: A | Discharge status: Home Health | Payer: Medicare Other | Attending: Internal Medicine | Admitting: Internal Medicine

## 2015-12-13 ENCOUNTER — Encounter (HOSPITAL_COMMUNITY): Payer: Self-pay | Admitting: *Deleted

## 2015-12-13 ENCOUNTER — Emergency Department (HOSPITAL_COMMUNITY): Payer: Medicare Other

## 2015-12-13 DIAGNOSIS — D631 Anemia in chronic kidney disease: Secondary | ICD-10-CM | POA: Diagnosis not present

## 2015-12-13 DIAGNOSIS — I5042 Chronic combined systolic (congestive) and diastolic (congestive) heart failure: Secondary | ICD-10-CM

## 2015-12-13 DIAGNOSIS — Z8612 Personal history of poliomyelitis: Secondary | ICD-10-CM | POA: Diagnosis not present

## 2015-12-13 DIAGNOSIS — Z7901 Long term (current) use of anticoagulants: Secondary | ICD-10-CM | POA: Diagnosis not present

## 2015-12-13 DIAGNOSIS — R7989 Other specified abnormal findings of blood chemistry: Secondary | ICD-10-CM | POA: Diagnosis not present

## 2015-12-13 DIAGNOSIS — Z888 Allergy status to other drugs, medicaments and biological substances status: Secondary | ICD-10-CM | POA: Diagnosis not present

## 2015-12-13 DIAGNOSIS — K580 Irritable bowel syndrome with diarrhea: Secondary | ICD-10-CM | POA: Diagnosis present

## 2015-12-13 DIAGNOSIS — Z885 Allergy status to narcotic agent status: Secondary | ICD-10-CM | POA: Diagnosis not present

## 2015-12-13 DIAGNOSIS — R197 Diarrhea, unspecified: Secondary | ICD-10-CM | POA: Diagnosis not present

## 2015-12-13 DIAGNOSIS — N189 Chronic kidney disease, unspecified: Secondary | ICD-10-CM | POA: Diagnosis not present

## 2015-12-13 DIAGNOSIS — Z85828 Personal history of other malignant neoplasm of skin: Secondary | ICD-10-CM | POA: Diagnosis not present

## 2015-12-13 DIAGNOSIS — Z96651 Presence of right artificial knee joint: Secondary | ICD-10-CM | POA: Diagnosis present

## 2015-12-13 DIAGNOSIS — I130 Hypertensive heart and chronic kidney disease with heart failure and stage 1 through stage 4 chronic kidney disease, or unspecified chronic kidney disease: Secondary | ICD-10-CM | POA: Diagnosis present

## 2015-12-13 DIAGNOSIS — I509 Heart failure, unspecified: Secondary | ICD-10-CM | POA: Diagnosis not present

## 2015-12-13 DIAGNOSIS — I482 Chronic atrial fibrillation: Secondary | ICD-10-CM

## 2015-12-13 DIAGNOSIS — R05 Cough: Secondary | ICD-10-CM | POA: Diagnosis not present

## 2015-12-13 DIAGNOSIS — R778 Other specified abnormalities of plasma proteins: Secondary | ICD-10-CM

## 2015-12-13 DIAGNOSIS — I350 Nonrheumatic aortic (valve) stenosis: Secondary | ICD-10-CM

## 2015-12-13 DIAGNOSIS — E871 Hypo-osmolality and hyponatremia: Secondary | ICD-10-CM

## 2015-12-13 DIAGNOSIS — N184 Chronic kidney disease, stage 4 (severe): Secondary | ICD-10-CM | POA: Diagnosis not present

## 2015-12-13 DIAGNOSIS — I10 Essential (primary) hypertension: Secondary | ICD-10-CM | POA: Diagnosis present

## 2015-12-13 DIAGNOSIS — N179 Acute kidney failure, unspecified: Secondary | ICD-10-CM | POA: Diagnosis present

## 2015-12-13 DIAGNOSIS — R531 Weakness: Secondary | ICD-10-CM | POA: Diagnosis not present

## 2015-12-13 DIAGNOSIS — Z79899 Other long term (current) drug therapy: Secondary | ICD-10-CM

## 2015-12-13 DIAGNOSIS — R0600 Dyspnea, unspecified: Secondary | ICD-10-CM | POA: Diagnosis not present

## 2015-12-13 DIAGNOSIS — R0602 Shortness of breath: Secondary | ICD-10-CM | POA: Diagnosis not present

## 2015-12-13 DIAGNOSIS — J189 Pneumonia, unspecified organism: Principal | ICD-10-CM | POA: Diagnosis present

## 2015-12-13 DIAGNOSIS — I5031 Acute diastolic (congestive) heart failure: Secondary | ICD-10-CM | POA: Diagnosis present

## 2015-12-13 DIAGNOSIS — K219 Gastro-esophageal reflux disease without esophagitis: Secondary | ICD-10-CM | POA: Diagnosis present

## 2015-12-13 DIAGNOSIS — I480 Paroxysmal atrial fibrillation: Secondary | ICD-10-CM | POA: Diagnosis not present

## 2015-12-13 HISTORY — DX: Pneumonia, unspecified organism: J18.9

## 2015-12-13 HISTORY — DX: Other specified postprocedural states: Z98.890

## 2015-12-13 HISTORY — DX: Other specified postprocedural states: R11.2

## 2015-12-13 HISTORY — DX: Nonrheumatic aortic (valve) stenosis: I35.0

## 2015-12-13 LAB — CBC
HEMATOCRIT: 38.4 % (ref 36.0–46.0)
HEMOGLOBIN: 11.5 g/dL — AB (ref 12.0–15.0)
MCH: 24.8 pg — AB (ref 26.0–34.0)
MCHC: 29.9 g/dL — AB (ref 30.0–36.0)
MCV: 82.8 fL (ref 78.0–100.0)
Platelets: 270 10*3/uL (ref 150–400)
RBC: 4.64 MIL/uL (ref 3.87–5.11)
RDW: 16.3 % — AB (ref 11.5–15.5)
WBC: 9.4 10*3/uL (ref 4.0–10.5)

## 2015-12-13 LAB — BASIC METABOLIC PANEL
Anion gap: 9 (ref 5–15)
BUN: 35 mg/dL — AB (ref 6–20)
CO2: 27 mmol/L (ref 22–32)
Calcium: 9.2 mg/dL (ref 8.9–10.3)
Chloride: 95 mmol/L — ABNORMAL LOW (ref 101–111)
Creatinine, Ser: 2.25 mg/dL — ABNORMAL HIGH (ref 0.44–1.00)
GFR calc Af Amer: 22 mL/min — ABNORMAL LOW (ref >60)
GFR calc non Af Amer: 19 mL/min — ABNORMAL LOW (ref >60)
Glucose, Bld: 105 mg/dL — ABNORMAL HIGH (ref 65–99)
POTASSIUM: 4.1 mmol/L (ref 3.5–5.1)
SODIUM: 131 mmol/L — AB (ref 135–145)

## 2015-12-13 LAB — I-STAT ARTERIAL BLOOD GAS, ED
Acid-Base Excess: 2 mmol/L (ref 0.0–2.0)
Bicarbonate: 28 mEq/L — ABNORMAL HIGH (ref 20.0–24.0)
O2 Saturation: 97 %
PCO2 ART: 45.5 mmHg — AB (ref 35.0–45.0)
TCO2: 29 mmol/L (ref 0–100)
pH, Arterial: 7.395 (ref 7.350–7.450)
pO2, Arterial: 94 mmHg (ref 80.0–100.0)

## 2015-12-13 LAB — I-STAT TROPONIN, ED
Troponin i, poc: 0.13 ng/mL (ref 0.00–0.08)
Troponin i, poc: 0.12 ng/mL (ref 0.00–0.08)

## 2015-12-13 MED ORDER — AZITHROMYCIN 500 MG PO TABS
500.0000 mg | ORAL_TABLET | ORAL | Status: DC
  Administered 2015-12-14: 500 mg via ORAL
  Filled 2015-12-13: qty 1

## 2015-12-13 MED ORDER — ACETAMINOPHEN 650 MG RE SUPP: 650.0000 mg | Freq: Four times a day (QID) | RECTAL | Status: DC | PRN

## 2015-12-13 MED ORDER — AMIODARONE HCL 200 MG PO TABS
200.0000 mg | ORAL_TABLET | Freq: Every day | ORAL | Status: DC
  Administered 2015-12-14 – 2015-12-15 (×2): 200 mg via ORAL
  Filled 2015-12-13 (×2): qty 1

## 2015-12-13 MED ORDER — PANTOPRAZOLE SODIUM 40 MG PO TBEC
40.0000 mg | DELAYED_RELEASE_TABLET | Freq: Every day | ORAL | Status: DC
  Administered 2015-12-14 – 2015-12-15 (×2): 40 mg via ORAL
  Filled 2015-12-13 (×2): qty 1

## 2015-12-13 MED ORDER — APIXABAN 2.5 MG PO TABS
2.5000 mg | ORAL_TABLET | Freq: Two times a day (BID) | ORAL | Status: DC
  Administered 2015-12-13 – 2015-12-15 (×4): 2.5 mg via ORAL
  Filled 2015-12-13 (×4): qty 1

## 2015-12-13 MED ORDER — DEXTROSE 5 % IV SOLN
1.0000 g | Freq: Once | INTRAVENOUS | Status: AC
  Administered 2015-12-13: 1 g via INTRAVENOUS
  Filled 2015-12-13: qty 10

## 2015-12-13 MED ORDER — AMLODIPINE BESYLATE 2.5 MG PO TABS
2.5000 mg | ORAL_TABLET | Freq: Every day | ORAL | Status: DC
  Administered 2015-12-14 – 2015-12-15 (×2): 2.5 mg via ORAL
  Filled 2015-12-13 (×2): qty 1

## 2015-12-13 MED ORDER — SACCHAROMYCES BOULARDII 250 MG PO CAPS
250.0000 mg | ORAL_CAPSULE | Freq: Two times a day (BID) | ORAL | Status: DC
  Administered 2015-12-13 – 2015-12-15 (×4): 250 mg via ORAL
  Filled 2015-12-13 (×4): qty 1

## 2015-12-13 MED ORDER — HYDROCOD POLST-CPM POLST ER 10-8 MG/5ML PO SUER
5.0000 mL | Freq: Two times a day (BID) | ORAL | Status: DC
  Administered 2015-12-13 – 2015-12-15 (×4): 5 mL via ORAL
  Filled 2015-12-13 (×4): qty 5

## 2015-12-13 MED ORDER — FUROSEMIDE 20 MG PO TABS
20.0000 mg | ORAL_TABLET | Freq: Every day | ORAL | Status: DC
  Administered 2015-12-14 – 2015-12-15 (×2): 20 mg via ORAL
  Filled 2015-12-13 (×2): qty 1

## 2015-12-13 MED ORDER — SODIUM CHLORIDE 0.9 % IV BOLUS (SEPSIS)
500.0000 mL | Freq: Once | INTRAVENOUS | Status: AC
  Administered 2015-12-13: 500 mL via INTRAVENOUS

## 2015-12-13 MED ORDER — ACETAMINOPHEN 325 MG PO TABS
650.0000 mg | ORAL_TABLET | Freq: Four times a day (QID) | ORAL | Status: DC | PRN
  Administered 2015-12-15: 650 mg via ORAL
  Filled 2015-12-13: qty 2

## 2015-12-13 MED ORDER — DEXTROSE 5 % IV SOLN
500.0000 mg | Freq: Once | INTRAVENOUS | Status: AC
  Administered 2015-12-13: 500 mg via INTRAVENOUS
  Filled 2015-12-13: qty 500

## 2015-12-13 MED ORDER — DEXTROSE 5 % IV SOLN
1.0000 g | INTRAVENOUS | Status: DC
  Administered 2015-12-14: 1 g via INTRAVENOUS
  Filled 2015-12-13 (×2): qty 10

## 2015-12-13 NOTE — ED Notes (Signed)
The pt just returned from xray  She is c/o a headache at present other complaint

## 2015-12-13 NOTE — Consult Note (Signed)
Referring Physician: Dr. Pattricia Boss-- ER  Primary Cardiologist: Dr. Irish Lack  Reason for Consultation: Elevated Troponin  HPI: 82 pleasant [place] woman with history of paroxysmal AF (on Amiodarone for about two years), CHA2DS2-VASc score at least 4 (gender, age, HTN; on Eliquis) presented to ER for evaluation. Her main complaint to me is diarrhea for about two weeks (loose stools 3-4 times per day), severe headache and bouts of severe cough associated with yellow sputum. No fever, N/V, syncope, CP, orthopnea, PND. Has chronic bilateral lower extremity edema without any change recently. In ER, being diagnosed with LLL pneumonia. She also seems to have AKI on CKD with Cr up to 2.25 today (was 1.89 three days ago). Upon my evaluation, she was supine in bed, no distress, 99% o2 sat on 2 lit Tira with NSR and normal BP.     Review of Systems:  10 systems reviewed unremarkable except as noted in HPI   Past Medical History:  Diagnosis Date  . Arthritis 08-28-11   right knee pain-surgery planned  . Cancer [facility]) 08-28-11   Melonoma-cheek(many Yrs ago) , recent bx. left  face lesion, past skin cancer lesions  . CHF (congestive heart failure) ([place])   . Complication of anesthesia 08-28-11   in past arrythmia-  cardiology has seen in past  . Dysrhythmia 08-28-11   only immed.  to several days after surgery- hx. A.fib. in past  . GERD (gastroesophageal reflux disease) 08-28-11   tx. omeprazole  . Hypertension 08-28-11   tx. meds  . IBS (irritable bowel syndrome)   . Neuromuscular disorder ([place]) 08-28-11   hx. Polio age 27-slight residual affects legs  . Swelling of extremity 08-28-11   bilateral lower extremities- mid calf down   Past Surgical History:  Procedure Laterality Date  . ABDOMINAL HYSTERECTOMY  08-28-11   Partial-vaginal approach  . BUNIONECTOMY  08-28-11   right  . CARDIOVERSION N/A 08/05/2014   Procedure: CARDIOVERSION;  Surgeon: Larey Dresser, MD;  Location: [place];  Service:  Cardiovascular;  Laterality: N/A;  . CATARACT EXTRACTION, BILATERAL  08-28-11   bilateral  . EYE SURGERY     Cataract  . JOINT REPLACEMENT  Oct. 1, 2012   Right Knee  . KNEE ARTHROSCOPY  09/06/2011   Procedure: ARTHROSCOPY KNEE;  Surgeon: Gearlean Alf, MD;  Location: [facility];  Service: Orthopedics;  Laterality: Right;  with Synovectomy      (Not in a hospital admission)   No current facility-administered medications on file prior to encounter.    Current Outpatient Prescriptions on File Prior to Encounter  Medication Sig Dispense Refill  . acetaminophen (TYLENOL) 500 MG tablet Take 1,000 mg by mouth every 8 (eight) hours as needed for moderate pain (pain in legs).    [name] amiodarone (PACERONE) 200 MG tablet TAKE 1 TABLET (200 MG TOTAL) BY MOUTH DAILY. 90 tablet 3  . amLODipine (NORVASC) 2.5 MG tablet Take 1 tablet (2.5 mg total) by mouth daily. 90 tablet 0  . chlorpheniramine-HYDROcodone (TUSSIONEX PENNKINETIC ER) 10-8 MG/5ML SUER Take 5 mLs by mouth 2 (two) times daily. 100 mL 0  . Cholecalciferol (VITAMIN D3) 2000 UNITS capsule Take 2,000 Units by mouth daily.    [name] ELIQUIS 2.5 MG TABS tablet TAKE 1 TABLET (2.5 MG TOTAL) BY MOUTH 2 (TWO) TIMES DAILY. 180 tablet 1  . fexofenadine (ALLEGRA) 180 MG tablet Take 180 mg by mouth daily.    . fluticasone (FLONASE) 50 MCG/ACT nasal spray Place 1 spray into both  nostrils daily as needed for allergies.     [name] latanoprost (XALATAN) 0.005 % ophthalmic solution Place 1 drop into both eyes at bedtime.  3  . meclizine (ANTIVERT) 25 MG tablet Take 12.5 mg by mouth every other day.   0  . OVER THE COUNTER MEDICATION Take 1 tablet by mouth daily. Move Free Ultra    . pantoprazole (PROTONIX) 40 MG tablet Take 40 mg by mouth daily.    [name] tetrahydrozoline 0.05 % ophthalmic solution Place 1 drop into both eyes 2 (two) times daily as needed (for allergy eyes).        Infusions:   Allergies  Allergen Reactions  . Hydromorphone Nausea And Vomiting  .  Codeine Other (See Comments)    "Spaced out feeling"  . Augmentin [Amoxicillin-Pot Clavulanate] Diarrhea and Nausea Only  . Boniva [Ibandronic Acid] Diarrhea  . Latex Other (See Comments)    Unknown  . Lisinopril Cough  . Tape Itching and Rash    Social History   Social History  . Marital status: Married    Spouse name: [name]  . Number of children: 3  . Years of education: 12   Occupational History  . Retired- [employer] History Main Topics  . Smoking status: Never Smoker  . Smokeless tobacco: Never Used  . Alcohol use No  . Drug use: No  . Sexual activity: Not Currently    Partners: Male   Other Topics Concern  . Not on file   Social History Narrative   Lives with husband, Hilbert Odor at home   Caffeine use: Drinks coffee/tea/caffeine free soda    Family History  Problem Relation Age of Onset  . Cancer Mother   . Heart disease Mother     Varicose Veins  . Hyperlipidemia Mother   . Hypertension Mother   . Heart disease Father     Heart Disease before age 49  . Hypertension Father   . Cancer Sister   . Stroke Maternal Grandmother   . Heart attack Brother     PHYSICAL EXAM: Vitals:   12/13/15 1930 12/13/15 2000  BP: 130/57 151/81  Pulse: 62 71  Resp: 17 21  Temp:      No intake or output data in the 24 hours ending 12/13/15 2121  General:  Well appearing. No respiratory difficulty HEENT: conjunctival hemorrhage R eye Neck: supple. no JVD. Carotids 2+ bilat; no bruits. No lymphadenopathy or thryomegaly appreciated. Cor: PMI nondisplaced. Regular rate & rhythm. No rubs, gallops or murmurs. Lungs: clear Abdomen: soft, nontender, nondistended. No hepatosplenomegaly. No bruits or masses. Good bowel sounds. Extremities: no cyanosis, clubbing, rash, edema Neuro: alert & oriented x 3, cranial nerves grossly intact. moves all 4 extremities w/o difficulty. Affect pleasant.  ECG: NSR, LAFB, no acute ischemic changes  Results for orders placed or  performed during the hospital encounter of 12/13/15 (from the past 24 hour(s))  Basic metabolic panel     Status: Abnormal   Collection Time: 12/13/15  4:33 PM  Result Value Ref Range   Sodium 131 (L) 135 - 145 mmol/L   Potassium 4.1 3.5 - 5.1 mmol/L   Chloride 95 (L) 101 - 111 mmol/L   CO2 27 22 - 32 mmol/L   Glucose, Bld 105 (H) 65 - 99 mg/dL   BUN 35 (H) 6 - 20 mg/dL   Creatinine, Ser 2.25 (H) 0.44 - 1.00 mg/dL   Calcium 9.2 8.9 - 10.3 mg/dL   GFR calc  non Af Amer 19 (L) >60 mL/min   GFR calc Af Amer 22 (L) >60 mL/min   Anion gap 9 5 - 15  CBC     Status: Abnormal   Collection Time: 12/13/15  4:33 PM  Result Value Ref Range   WBC 9.4 4.0 - 10.5 K/uL   RBC 4.64 3.87 - 5.11 MIL/uL   Hemoglobin 11.5 (L) 12.0 - 15.0 g/dL   HCT 38.4 36.0 - 46.0 %   MCV 82.8 78.0 - 100.0 fL   MCH 24.8 (L) 26.0 - 34.0 pg   MCHC 29.9 (L) 30.0 - 36.0 g/dL   RDW 16.3 (H) 11.5 - 15.5 %   Platelets 270 150 - 400 K/uL  I-stat troponin, ED     Status: Abnormal   Collection Time: 12/13/15  4:41 PM  Result Value Ref Range   Troponin i, poc 0.13 (HH) 0.00 - 0.08 ng/mL   Comment NOTIFIED PHYSICIAN    Comment 3          I-stat troponin, ED     Status: Abnormal   Collection Time: 12/13/15  5:22 PM  Result Value Ref Range   Troponin i, poc 0.12 (HH) 0.00 - 0.08 ng/mL   Comment NOTIFIED PHYSICIAN    Comment 3          I-Stat arterial blood gas, ED     Status: Abnormal   Collection Time: 12/13/15  6:24 PM  Result Value Ref Range   pH, Arterial 7.395 7.350 - 7.450   pCO2 arterial 45.5 (H) 35.0 - 45.0 mmHg   pO2, Arterial 94.0 80.0 - 100.0 mmHg   Bicarbonate 28.0 (H) 20.0 - 24.0 mEq/L   TCO2 29 0 - 100 mmol/L   O2 Saturation 97.0 %   Acid-Base Excess 2.0 0.0 - 2.0 mmol/L   Patient temperature 97.8 F    Collection site RADIAL, ALLEN'S TEST ACCEPTABLE    Drawn by Operator    Sample type ARTERIAL    Dg Chest 2 View  Result Date: 12/13/2015 CLINICAL DATA:  Shortness of breath. Headaches and diarrhea.  Productive cough. EXAM: CHEST  2 VIEW COMPARISON:  Two-view chest x-ray 12-19-202017. FINDINGS: The heart is mildly enlarged. Ill-defined airspace disease at the left base is new. Emphysematous changes are noted. Exaggerated kyphosis of the thoracic spine is stable. A remote superior endplate fracture at 624THL is stable. Atherosclerotic changes are present within the thoracic spine. IMPRESSION: 1. New left lower lobe airspace disease is concerning for pneumonia. 2. Otherwise stable chronic findings. Electronically Signed   By: San Morelle M.D.   On: 12/13/2015 17:32     ASSESSMENT:  1. Mildly elevated Troponin without rise or fall (flat) in the setting of possible community acquired pneumonia and AKI over CKD. No angina. Do not think it represents ACS but likely a secondary phenomenon.   2. Paroxysmal AF. Currently in NSR. On Amiodarone and Eliquis 2.5 mg bid.   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 Chronic pedal edema without recent change    PLAN/DISCUSSION:  Continue Amiodarone and Eliquis Check echo. Last echo in 01/2014 showed normal LVEF  Thanks for consult. Please call if questions.   Cardiology consult service will follow.    Wandra Mannan, MD Cardiology

## 2015-12-13 NOTE — ED Triage Notes (Signed)
Reported critical troponin to Dr Jeneen Rinks. Pt will get next room available.

## 2015-12-13 NOTE — ED Notes (Signed)
meds delayed needs blood cultures  Hard inv stick plebotomy  Needs to stick

## 2015-12-13 NOTE — ED Provider Notes (Signed)
[place] DEPT Provider Note   CSN: [id number] Arrival date & time: 12/13/15  1620  First Provider Contact:  None       History   Chief Complaint Chief Complaint  Patient presents with  . Diarrhea  . Shortness of Breath    HPI Heather Benson is a 82 y.o. female.History of congestive heart failure presents today complaining of cough, dyspnea, headache.  She states that she began having cough and congestion over the week ago. She was seen by her primary care physician 5 days ago and started on antibiotics. She states she took antibiotics for 2 days but had severe dizziness and diarrhea. She stopped the antibiotics 4 days ago. She was seen in the ED the next day and given cough medicine. She has continued to have active cough with increasing weakness, dyspnea, and continued diarrhea. Cough is productive of sputum. She denies fever or chills.  HPI  Past Medical History:  Diagnosis Date  . Arthritis 08-28-11   right knee pain-surgery planned  . Cancer [facility]) 08-28-11   Melonoma-cheek(many Yrs ago) , recent bx. left  face lesion, past skin cancer lesions  . CHF (congestive heart failure) ([place])   . Complication of anesthesia 08-28-11   in past arrythmia-  cardiology has seen in past  . Dysrhythmia 08-28-11   only immed.  to several days after surgery- hx. A.fib. in past  . GERD (gastroesophageal reflux disease) 08-28-11   tx. omeprazole  . Hypertension 08-28-11   tx. meds  . IBS (irritable bowel syndrome)   . Neuromuscular disorder ([place]) 08-28-11   hx. Polio age 25-slight residual affects legs  . Swelling of extremity 08-28-11   bilateral lower extremities- mid calf down    Patient Active Problem List   Diagnosis Date Noted  . DOE (dyspnea on exertion) 03/06/2015  . Bradycardia 08/23/2014  . Essential hypertension 08/23/2014  . Lightheadedness 08/23/2014  . Fatigue 06/22/2014  . Atrial fibrillation ([place]) 01/27/2014  . Chronic kidney disease 01/27/2014  . Swelling of limb  04/01/2012  . Edema leg 04/01/2012    Past Surgical History:  Procedure Laterality Date  . ABDOMINAL HYSTERECTOMY  08-28-11   Partial-vaginal approach  . BUNIONECTOMY  08-28-11   right  . CARDIOVERSION N/A 08/05/2014   Procedure: CARDIOVERSION;  Surgeon: Larey Dresser, MD;  Location: [place];  Service: Cardiovascular;  Laterality: N/A;  . CATARACT EXTRACTION, BILATERAL  08-28-11   bilateral  . EYE SURGERY     Cataract  . JOINT REPLACEMENT  Oct. 1, 2012   Right Knee  . KNEE ARTHROSCOPY  09/06/2011   Procedure: ARTHROSCOPY KNEE;  Surgeon: Gearlean Alf, MD;  [facility];  Service: Orthopedics;  Laterality: Right;  with Synovectomy    OB History    No data available       Home Medications    Prior to Admission medications   Medication Sig Start Date End Date Taking? Authorizing Provider  acetaminophen (TYLENOL) 500 MG tablet Take 1,000 mg by mouth every 8 (eight) hours as needed for moderate pain (pain in legs).    Historical Provider, MD  amiodarone (PACERONE) 200 MG tablet TAKE 1 TABLET (200 MG TOTAL) BY MOUTH DAILY. 10/31/15   Jettie Booze, MD  amLODipine (NORVASC) 2.5 MG tablet Take 1 tablet (2.5 mg total) by mouth daily. 02/03/15   Jettie Booze, MD  amoxicillin-clavulanate (AUGMENTIN) 500-125 MG tablet Take 1 tablet by mouth 3 (three) times daily.    Historical Provider, MD  benzonatate (TESSALON) 200 MG capsule Take 200 mg by mouth 3 (three) times daily as needed for cough.    Historical Provider, MD  chlorpheniramine-HYDROcodone (TUSSIONEX PENNKINETIC ER) 10-8 MG/5ML SUER Take 5 mLs by mouth 2 (two) times daily. 12/10/15   Fransico Meadow, PA-C  Cholecalciferol (VITAMIN D3) 2000 UNITS capsule Take 2,000 Units by mouth daily.    Historical Provider, MD  ELIQUIS 2.5 MG TABS tablet TAKE 1 TABLET (2.5 MG TOTAL) BY MOUTH 2 (TWO) TIMES DAILY. 08/30/15   Jettie Booze, MD  fexofenadine (ALLEGRA) 180 MG tablet Take 180 mg by mouth daily.    Historical  Provider, MD  fluticasone (FLONASE) 50 MCG/ACT nasal spray Place 1 spray into both nostrils daily as needed for allergies.  09/27/13   Historical Provider, MD  latanoprost (XALATAN) 0.005 % ophthalmic solution Place 1 drop into both eyes at bedtime. 08/11/14   Historical Provider, MD  meclizine (ANTIVERT) 25 MG tablet Take 12.5 mg by mouth daily as needed for dizziness.  04/01/14   Historical Provider, MD  omeprazole (PRILOSEC) 20 MG capsule Take 20 mg by mouth daily.    Historical Provider, MD  OVER THE COUNTER MEDICATION Take 1 tablet by mouth daily. Move Free Ultra    Historical Provider, MD  pantoprazole (PROTONIX) 40 MG tablet Take 40 mg by mouth daily. 11/01/15   Historical Provider, MD  tetrahydrozoline 0.05 % ophthalmic solution Place 1 drop into both eyes 2 (two) times daily.    Historical Provider, MD    Family History Family History  Problem Relation Age of Onset  . Cancer Mother   . Heart disease Mother     Varicose Veins  . Hyperlipidemia Mother   . Hypertension Mother   . Heart disease Father     Heart Disease before age 51  . Hypertension Father   . Cancer Sister   . Stroke Maternal Grandmother   . Heart attack Brother     Social History Social History  Substance Use Topics  . Smoking status: Never Smoker  . Smokeless tobacco: Never Used  . Alcohol use No     Allergies   Hydromorphone; Codeine; Boniva [ibandronic acid]; and Lisinopril   Review of Systems Review of Systems  All other systems reviewed and are negative.    Physical Exam Updated Vital Signs BP 132/65 (BP Location: Left Arm)   Pulse 66   Temp 98.2 F (36.8 C) (Oral)   Resp 20   SpO2 97%   Physical Exam  Constitutional: She appears well-developed and well-nourished. No distress.  HENT:  Head: Normocephalic and atraumatic.  Nose: Nose normal.  Mouth/Throat: Oropharynx is clear and moist.  Eyes: EOM are normal. Pupils are equal, round, and reactive to light.  Neck: Normal range of  motion.  Cardiovascular: Normal rate, regular rhythm, normal heart sounds and intact distal pulses.   Pulmonary/Chest: Effort normal and breath sounds normal.  Abdominal: Soft. Bowel sounds are normal.  Musculoskeletal: Normal range of motion.  Skin: Skin is warm.  Psychiatric: She has a normal mood and affect.  Nursing note and vitals reviewed.    ED Treatments / Results  Labs (all labs ordered are listed, but only abnormal results are displayed) Labs Reviewed  BASIC METABOLIC PANEL - Abnormal; Notable for the following:       Result Value   Sodium 131 (*)    Chloride 95 (*)    Glucose, Bld 105 (*)    BUN 35 (*)    Creatinine, Ser  2.25 (*)    GFR calc non Af Amer 19 (*)    GFR calc Af Amer 22 (*)    All other components within normal limits  CBC - Abnormal; Notable for the following:    Hemoglobin 11.5 (*)    MCH 24.8 (*)    MCHC 29.9 (*)    RDW 16.3 (*)    All other components within normal limits  I-STAT TROPOININ, ED - Abnormal; Notable for the following:    Troponin i, poc 0.13 (*)    All other components within normal limits  I-STAT TROPOININ, ED - Abnormal; Notable for the following:    Troponin i, poc 0.12 (*)    All other components within normal limits    EKG  EKG Interpretation  Date/Time:  Wednesday December 13 2015 16:27:49 EDT Ventricular Rate:  67 PR Interval:  156 QRS Duration: 120 QT Interval:  468 QTC Calculation: 494 R Axis:   -51 Text Interpretation:  Normal sinus rhythm Left anterior fascicular block Left ventricular hypertrophy with QRS widening T wave abnormality, consider anterior ischemia Abnormal ECG biphasic t waves new from prior Confirmed by FLOYD MD, DANIEL [id number]) on 12/13/2015 4:34:33 PM       Radiology Dg Chest 2 View  Result Date: 12/13/2015 CLINICAL DATA:  Shortness of breath. Headaches and diarrhea. Productive cough. EXAM: CHEST  2 VIEW COMPARISON:  Two-view chest x-Jeovani Weisenburger 07-Sep-202017. FINDINGS: The heart is mildly enlarged.  Ill-defined airspace disease at the left base is new. Emphysematous changes are noted. Exaggerated kyphosis of the thoracic spine is stable. A remote superior endplate fracture at 624THL is stable. Atherosclerotic changes are present within the thoracic spine. IMPRESSION: 1. New left lower lobe airspace disease is concerning for pneumonia. 2. Otherwise stable chronic findings. Electronically Signed   By: San Morelle M.D.   On: 12/13/2015 17:32    Procedures Procedures (including critical care time)  Medications Ordered in ED Medications - No data to display   Initial Impression / Assessment and Plan / ED Course  I have reviewed the triage vital signs and the nursing notes.  Pertinent labs & imaging results that were available during my care of the patient were reviewed by me and considered in my medical decision making (see chart for details).  Clinical Course    82 year old female history of congestive heart failure presents today with right lower lobe pneumonia which has failed outpatient therapy and patient is requiring oxygen for symptoms. She received Rocephin and Zithromax IV here in the emergency department. Blood pressure and heart rate have been stable here in the emergency department. Initial sats were 88% but have been responsive to nasal cannula with sats in the upper 90s. Plan admission for further treatment. Will consult hospitalist. Patient has EKG with normal sinus rhythm with left anterior fascicular block with mildly positive troponins here at 0.13. Patient has not had any chest pain. She has had similar appearing EKGs in the past. Plan consult to cardiology. Discussed with Dr. Loleta Books.  Final Clinical Impressions(s) / ED Diagnoses   Final diagnoses:  CAP (community acquired pneumonia)  Elevated troponin    New Prescriptions New Prescriptions   No medications on file     Pattricia Boss, MD 12/13/15 2108

## 2015-12-13 NOTE — ED Triage Notes (Addendum)
Pt reports being seen x 2 recently for sob and diagnosed with CHF. Also having diarrhea for almost one week, denies n/v. Reports having headache and ear pain. ekg done at triage. No resp distress noted.

## 2015-12-13 NOTE — H&P (Signed)
History and Physical  Patient Name: Heather Benson     [id number]    DOB: [birth year]    DOA: 12/13/2015 PCP: Vidal Schwalbe, MD   Patient coming from: Home  Chief Complaint: Cough  HPI: Heather Benson is a 82 y.o. female with a past medical history significant for atrial fibrillation on amiodarone and apixaban, HTN, CKD stage IV who presents with cough for 1 week with fever, purulent sputum and dyspnea.  The patient has had a nagging cough (sometimes dry, sometimes productive) for about 6 months.  Now, over the last week to 10 days, this has worsened to frequent cough, productive of green, non-bloody sputum, chest discomfort with coughing, intermittent subjective fever and chills, and (per family) intermittent confusion/"being out of it".  1 week ago she went to her PCP who prescribed an antibiotic and T[place]on.  The antibiotic (Augmentin per pharmacy notes) gave her diarrhea, so she stopped after one day, and the cough syrup didn't help her cough, and so the cough worsened.    Three days ago she went to urgent Care, where a CXR was interpreted as pulmonary edema, and she was given Tussionex and told to take extra Lasix and follow up with PMD.  Today, she had continued fevers, purulent sputum, painful cough and chest congestion, so she came to the ER.  ED course: -Afebrile, heart rate and respirations normal, BP stable, hypoxic to 88% on RA, normalized with supplemental O2 by nasal cannula -Na 131, K 4.1, Cr 2.25 (baseline 1.9-2.0), WBC 9.4K, Hgb 11.5 and normocytic -Troponin I-STAT 0.13 and repeat stable -CXR showed again a LL opacity, indistinct, read by Radiology as possible CAP -ECG was unchanged and Cardiology were consulted by ED for elevated troponin -Blood cultures were drawn and she was administered ceftriaxone and azithromycin for community-acquired pneumonia and TRH was asked to evaluate for admission    Review of Systems:  Pt complains of cough, dyspnea, purulent  sputum, chest discomfort with cough, confusion. Pt denies any chest pain, increased leg swelling, orthopnea, PND, dyspnea with exertion.  All other systems negative except as just noted or noted in the history of present illness.    Past Medical History:  Diagnosis Date  . Arthritis 08-28-11   right knee pain-surgery planned  . Cancer [facility]) 08-28-11   Melonoma-cheek(many Yrs ago) , recent bx. left  face lesion, past skin cancer lesions  . CHF (congestive heart failure) ([place])   . Complication of anesthesia 08-28-11   in past arrythmia-  cardiology has seen in past  . Dysrhythmia 08-28-11   only immed.  to several days after surgery- hx. A.fib. in past  . GERD (gastroesophageal reflux disease) 08-28-11   tx. omeprazole  . Hypertension 08-28-11   tx. meds  . IBS (irritable bowel syndrome)   . Neuromuscular disorder ([place]) 08-28-11   hx. Polio age 64-slight residual affects legs  . Swelling of extremity 08-28-11   bilateral lower extremities- mid calf down    Past Surgical History:  Procedure Laterality Date  . ABDOMINAL HYSTERECTOMY  08-28-11   Partial-vaginal approach  . BUNIONECTOMY  08-28-11   right  . CARDIOVERSION N/A 08/05/2014   Procedure: CARDIOVERSION;  Surgeon: Larey Dresser, MD;  Location: [place];  Service: Cardiovascular;  Laterality: N/A;  . CATARACT EXTRACTION, BILATERAL  08-28-11   bilateral  . EYE SURGERY     Cataract  . JOINT REPLACEMENT  Oct. 1, 2012   Right Knee  . KNEE ARTHROSCOPY  09/06/2011   Procedure:  ARTHROSCOPY KNEE;  Surgeon: Gearlean Alf, MD;  Location: [facility];  Service: Orthopedics;  Laterality: Right;  with Synovectomy    Social History: Patient lives with her husband in [place]. She is a lifelong nonsmoker, only has remote history to smoke exposure. She still drives, walks unassisted. No evidence of dementia.  Allergies  Allergen Reactions  . Hydromorphone Nausea And Vomiting  . Codeine Other (See Comments)    "Spaced out feeling"    . Augmentin [Amoxicillin-Pot Clavulanate] Diarrhea and Nausea Only  . Boniva [Ibandronic Acid] Diarrhea  . Latex Other (See Comments)    Unknown  . Lisinopril Cough  . Tape Itching and Rash    Family history: family history includes Cancer in her mother and sister; Heart attack in her brother; Heart disease in her father and mother; Hyperlipidemia in her mother; Hypertension in her father and mother; Stroke in her maternal grandmother.  Prior to Admission medications   Medication Sig Start Date End Date Taking? Authorizing Provider  acetaminophen (TYLENOL) 500 MG tablet Take 1,000 mg by mouth every 8 (eight) hours as needed for moderate pain (pain in legs).   Yes Historical Provider, MD  amiodarone (PACERONE) 200 MG tablet TAKE 1 TABLET (200 MG TOTAL) BY MOUTH DAILY. 10/31/15  Yes Jettie Booze, MD  amLODipine (NORVASC) 2.5 MG tablet Take 1 tablet (2.5 mg total) by mouth daily. 02/03/15  Yes Jettie Booze, MD  chlorpheniramine-HYDROcodone [facility]) 10-8 MG/5ML SUER Take 5 mLs by mouth 2 (two) times daily. 12/10/15  Yes Fransico Meadow, PA-C  Cholecalciferol (VITAMIN D3) 2000 UNITS capsule Take 2,000 Units by mouth daily.   Yes Historical Provider, MD  ELIQUIS 2.5 MG TABS tablet TAKE 1 TABLET (2.5 MG TOTAL) BY MOUTH 2 (TWO) TIMES DAILY. 08/30/15  Yes Jettie Booze, MD  fexofenadine (ALLEGRA) 180 MG tablet Take 180 mg by mouth daily.   Yes Historical Provider, MD  fluticasone (FLONASE) 50 MCG/ACT nasal spray Place 1 spray into both nostrils daily as needed for allergies.  09/27/13  Yes Historical Provider, MD  furosemide (LASIX) 40 MG tablet Take 20 mg by mouth daily. 12/09/15  Yes Historical Provider, MD  latanoprost (XALATAN) 0.005 % ophthalmic solution Place 1 drop into both eyes at bedtime. 08/11/14  Yes Historical Provider, MD  meclizine (ANTIVERT) 25 MG tablet Take 12.5 mg by mouth every other day.  04/01/14  Yes Historical Provider, MD  OVER THE COUNTER  MEDICATION Take 1 tablet by mouth daily. Move Free Ultra   Yes Historical Provider, MD  pantoprazole (PROTONIX) 40 MG tablet Take 40 mg by mouth daily. 11/01/15  Yes Historical Provider, MD  tetrahydrozoline 0.05 % ophthalmic solution Place 1 drop into both eyes 2 (two) times daily as needed (for allergy eyes).    Yes Historical Provider, MD       Physical Exam: BP 153/66   Pulse 63   Temp 98.2 F (36.8 C) (Oral)   Resp 23   SpO2 99%  General appearance: Well-developed, obese elderly adult female, alert and in no acute distress.   Eyes: Anicteric, conjunctiva pink, lids and lashes normal.  Subconjunctival hemorrhage.   ENT: No nasal deformity, discharge, or epistaxis.  OP moist without lesions.   Lymph: No cervical or supraclavicular lymphadenopathy. Mildly tender. Skin: Warm and dry.  No jaundice.  No suspicious rashes or lesions. Cardiac: RRR, nl S1-S2, no murmurs appreciated.  Capillary refill is brisk.  JVP not visible.  Mild 1+ LE edema.  Radial  and DP pulses 2+ and symmetric. Respiratory: Normal respiratory rate and rhythm.  No wheezes.  I appreciate rales on the RIGHT actually, base. Abdomen: Abdomen soft without rigidity.  No TTP. No ascites, distension.   MSK: No deformities or effusions. Neuro: Cranial nerves normal.  Sensorium intact and responding to questions, attention normal.  Speech is fluent.  Moves all extremities equally and with normal coordination.    Psych: Behavior appropriate.  Affect pleasant.  No evidence of aural or visual hallucinations or delusions.       Labs on Admission:  I have personally reviewed the following studies: The metabolic panel shows hyponatremia (new), normal potassium and bicarbonate, creatinine 2.25, chronically elevated, near baseline. Blood cultures pending. Troponin i-STAT 0.13 and repeat stable. The complete blood count shows no leukocytosis or thrombocytopenia. There si a chronic normocytic anemia.   Radiological Exams on  Admission: Personally reviewed, this is not an impressive infiltrate: Dg Chest 2 View  Result Date: 12/13/2015 CLINICAL DATA:  Shortness of breath. Headaches and diarrhea. Productive cough. EXAM: CHEST  2 VIEW COMPARISON:  Two-view chest x-ray 09/18/2015. FINDINGS: The heart is mildly enlarged. Ill-defined airspace disease at the left base is new. Emphysematous changes are noted. Exaggerated kyphosis of the thoracic spine is stable. A remote superior endplate fracture at 624THL is stable. Atherosclerotic changes are present within the thoracic spine. IMPRESSION: 1. New left lower lobe airspace disease is concerning for pneumonia. 2. Otherwise stable chronic findings. Electronically Signed   By: San Morelle M.D.   On: 12/13/2015 17:32    EKG: Independently reviewed. Rate 67, QTC 494, LAFB, nonspecific IVCD, inferior repolarization abnormality, similar to previous but more pronounced.  Echocardiogram 08/25/2013: EF 50-55%, no significant valvular disease.   Assessment/Plan 1. CAP:  Fever, cough productive of green sputum, hypoxia and new opacity on CXR although subtle.   -Ceftriaxone and azithromycin -Tussionex for cough (patient reports this was somewhat helpful) -Probiotic, given diarrhea with Augmentin -Sputum culture, follow blood cultures -Check urine strep and Legionella antigens -Check BNP and Procalcitonin -Incentive spirometry and supplemental oxygen as needed   2. Elevated troponin:  Doubt ACS. -Cardiology consulted, appreciate cares -Trend enzymes -Check echocardiogram  3. Hyponatremia:  Suspect this is hypovolemic and setting pneumonia.  -Check free water clearance and urine sodium  4. CKD stage IV:  Minimal increase in creatinine from baseline 1.9-2.0. -Monitor BMP while in hospital  5. HTN:  Normotensive at admission. -Continue amlodipine with hold parameters -Continue home furosemide  6. Atrial fibrillation:  CHADS2-VASc 4. On amiodarone and  apixaban. -Continue amlodipine -Continue apixaban, appropriately dosed given age and renal function -Check magnesium  7. Anemia of renal disease:  Stable     DVT prophylaxis: not needed, on apixaban  Code Status: FULL  Family Communication: Daughter-in-law and husband at bedside  Disposition Plan: Anticipate fluids, trend enzymes, follow procalcitonin and BNP, check echo.  For suspected CAP, ceftriaxone and azithromycin.  Patient desires to go home as soon as possible. Consults called: Cardiology. Admission status: INPATIENT, telemetry   Medical decision making: Patient seen at 10:00 PM on 12/13/2015.  The patient was discussed with Dr. Jeanell Sparrow.  What exists of the patient's chart was reviewed in depth.  Clinical condition: stable.          Edwin Dada [facility] Pager [phone]

## 2015-12-14 ENCOUNTER — Inpatient Hospital Stay (HOSPITAL_COMMUNITY): Payer: Medicare Other

## 2015-12-14 ENCOUNTER — Encounter (HOSPITAL_COMMUNITY): Payer: Self-pay | Admitting: General Practice

## 2015-12-14 DIAGNOSIS — I480 Paroxysmal atrial fibrillation: Secondary | ICD-10-CM

## 2015-12-14 DIAGNOSIS — I350 Nonrheumatic aortic (valve) stenosis: Secondary | ICD-10-CM

## 2015-12-14 DIAGNOSIS — R0600 Dyspnea, unspecified: Secondary | ICD-10-CM

## 2015-12-14 DIAGNOSIS — I5042 Chronic combined systolic (congestive) and diastolic (congestive) heart failure: Secondary | ICD-10-CM

## 2015-12-14 DIAGNOSIS — N184 Chronic kidney disease, stage 4 (severe): Secondary | ICD-10-CM

## 2015-12-14 DIAGNOSIS — I5031 Acute diastolic (congestive) heart failure: Secondary | ICD-10-CM

## 2015-12-14 DIAGNOSIS — I10 Essential (primary) hypertension: Secondary | ICD-10-CM

## 2015-12-14 HISTORY — DX: Nonrheumatic aortic (valve) stenosis: I35.0

## 2015-12-14 LAB — CBC
HCT: 36.5 % (ref 36.0–46.0)
HEMOGLOBIN: 10.7 g/dL — AB (ref 12.0–15.0)
MCH: 24.2 pg — ABNORMAL LOW (ref 26.0–34.0)
MCHC: 29.3 g/dL — ABNORMAL LOW (ref 30.0–36.0)
MCV: 82.6 fL (ref 78.0–100.0)
PLATELETS: 231 10*3/uL (ref 150–400)
RBC: 4.42 MIL/uL (ref 3.87–5.11)
RDW: 16 % — ABNORMAL HIGH (ref 11.5–15.5)
WBC: 6.7 10*3/uL (ref 4.0–10.5)

## 2015-12-14 LAB — PROCALCITONIN: Procalcitonin: 0.27 ng/mL

## 2015-12-14 LAB — ECHOCARDIOGRAM COMPLETE
HEIGHTINCHES: 66 in
WEIGHTICAEL: 3497.6 [oz_av]

## 2015-12-14 LAB — BASIC METABOLIC PANEL
ANION GAP: 9 (ref 5–15)
BUN: 29 mg/dL — ABNORMAL HIGH (ref 6–20)
CALCIUM: 8.8 mg/dL — AB (ref 8.9–10.3)
CO2: 27 mmol/L (ref 22–32)
CREATININE: 1.95 mg/dL — AB (ref 0.44–1.00)
Chloride: 99 mmol/L — ABNORMAL LOW (ref 101–111)
GFR calc non Af Amer: 23 mL/min — ABNORMAL LOW (ref >60)
GFR, EST AFRICAN AMERICAN: 26 mL/min — AB (ref >60)
Glucose, Bld: 113 mg/dL — ABNORMAL HIGH (ref 65–99)
Potassium: 3.9 mmol/L (ref 3.5–5.1)
SODIUM: 135 mmol/L (ref 135–145)

## 2015-12-14 LAB — BRAIN NATRIURETIC PEPTIDE: B NATRIURETIC PEPTIDE 5: 211 pg/mL — AB (ref 0.0–100.0)

## 2015-12-14 LAB — STREP PNEUMONIAE URINARY ANTIGEN: Strep Pneumo Urinary Antigen: NEGATIVE

## 2015-12-14 LAB — TROPONIN I
TROPONIN I: 0.07 ng/mL — AB (ref <0.03)
TROPONIN I: 0.09 ng/mL — AB (ref <0.03)

## 2015-12-14 LAB — MAGNESIUM: MAGNESIUM: 2.2 mg/dL (ref 1.7–2.4)

## 2015-12-14 LAB — OSMOLALITY: Osmolality: 289 mOsm/kg (ref 275–295)

## 2015-12-14 LAB — SODIUM, URINE, RANDOM: Sodium, Ur: 29 mmol/L

## 2015-12-14 LAB — OSMOLALITY, URINE: Osmolality, Ur: 297 mOsm/kg — ABNORMAL LOW (ref 300–900)

## 2015-12-14 MED ORDER — FUROSEMIDE 10 MG/ML IJ SOLN
20.0000 mg | Freq: Once | INTRAMUSCULAR | Status: AC
  Administered 2015-12-14: 20 mg via INTRAVENOUS
  Filled 2015-12-14: qty 2

## 2015-12-14 NOTE — Progress Notes (Signed)
Patient Name: Heather Benson Date of Encounter: 12/14/2015  Hospital Problem List     Principal Problem:   CAP (community acquired pneumonia) Active Problems:   Permanent atrial fibrillation (HCC)   CKD (chronic kidney disease), stage IV ([place])   Essential hypertension   Elevated troponin   Anemia in chronic renal disease   Hyponatremia    Subjective   Breathing easier this morning. No reports of angina. Sitting on the side of the bed.   Inpatient Medications    . amiodarone  200 mg Oral Daily  . amLODipine  2.5 mg Oral Daily  . apixaban  2.5 mg Oral BID  . azithromycin  500 mg Oral Q24H  . cefTRIAXone (ROCEPHIN)  IV  1 g Intravenous Q24H  . chlorpheniramine-HYDROcodone  5 mL Oral BID  . furosemide  20 mg Oral Daily  . pantoprazole  40 mg Oral Daily  . saccharomyces boulardii  250 mg Oral BID    Vital Signs    Vitals:   12/13/15 2100 12/13/15 2200 12/13/15 2331 12/14/15 0322  BP: (!) 126/53 153/66 (!) 147/66 (!) 141/59  Pulse: 62 63 64 64  Resp: 18 23 20 20   Temp:   97.7 F (36.5 C) 98.6 F (37 C)  TempSrc:   Oral Oral  SpO2: 100% 99% 98% 97%  Weight:   218 lb 11.2 oz (99.2 kg) 218 lb 9.6 oz (99.2 kg)  Height:   5\' 6"  (1.676 m)     Intake/Output Summary (Last 24 hours) at 12/14/15 0805 Last data filed at 12/13/15 2332  Gross per 24 hour  Intake                0 ml  Output              100 ml  Net             -100 ml   Filed Weights   12/13/15 2331 12/14/15 0322  Weight: 218 lb 11.2 oz (99.2 kg) 218 lb 9.6 oz (99.2 kg)    Physical Exam    General: Pleasant obese female, NAD. Neuro: Alert and oriented X 3. Moves all extremities spontaneously. Psych: Normal affect. HEENT:  Normal  Neck: Supple without bruits or JVD. Lungs:  Resp regular and unlabored, Dry crackles bilat bases. Heart: RRR no s3, s4, 2/6 systolic murmur. Abdomen: Soft, non-tender, non-distended, BS + x 4.  Extremities: No clubbing, cyanosis 2+ bilat lower extremity edema.  DP/PT/Radials 2+ and equal bilaterally.  Labs    CBC  Recent Labs  12/13/15 1633 12/14/15 0459  WBC 9.4 6.7  HGB 11.5* 10.7*  HCT 38.4 36.5  MCV 82.8 82.6  PLT 270 [id number]   Basic Metabolic Panel  Recent Labs  12/13/15 1633 12/13/15 2334 12/14/15 0459  NA 131*  --  135  K 4.1  --  3.9  CL 95*  --  99*  CO2 27  --  27  GLUCOSE 105*  --  113*  BUN 35*  --  29*  CREATININE 2.25*  --  1.95*  CALCIUM 9.2  --  8.8*  MG  --  2.2  --    Liver Function Tests No results for input(s): AST, ALT, ALKPHOS, BILITOT, PROT, ALBUMIN in the last 72 hours. No results for input(s): LIPASE, AMYLASE in the last 72 hours. Cardiac Enzymes  Recent Labs  12/13/15 2334  TROPONINI 0.09*   BNP Invalid input(s): POCBNP D-Dimer No results for input(s): DDIMER in the last  72 hours. Hemoglobin A1C No results for input(s): HGBA1C in the last 72 hours. Fasting Lipid Panel No results for input(s): CHOL, HDL, LDLCALC, TRIG, CHOLHDL, LDLDIRECT in the last 72 hours. Thyroid Function Tests No results for input(s): TSH, T4TOTAL, T3FREE, THYROIDAB in the last 72 hours.  Invalid input(s): FREET3  Telemetry    SR  ECG    SR Rate-67 LAFB (old)  Radiology     Assessment & Plan    82 yo female with PMH of PAF (on amiodarone and eliquis), HTN, IBS, diastolic CHF, CKD IV who presented to the ED with reports of increased dyspnea, headache and diarrhea for about 2 weeks.   1. LLL CAP: Treatment per primary  2. Elevated trop: Did have mild elevation in POC trop on admission with downward trend. 0.13>>0.09. Denies having had any episodes of chest pain prior or during this admission. At this time not likely to be ACS. 2/2 demand ischemia from CAP. Md to advise if further testing warranted.  3. AKI on CKD IV: Cr with improvement this morning. Follow BMET  4. HTN: Borderline controlled.  -- continue norvasc, adequate room to adjust if blood pressure remains elevated.  5. PAF:  SR on telemetry --  Continue amiodarone, Eliquis -- This patients CHA2DS2-VASc Score and unadjusted Ischemic Stroke Rate (% per year) is equal to 4.8 % stroke rate/year from a score of 4 Above score calculated as 1 point each if present [CHF, HTN, DM, Vascular=MI/PAD/Aortic Plaque, Age if 65-74, or Female] Above score calculated as 2 points each if present [Age > 75, or Stroke/TIA/TE]  6. Diastolic CHF: Does not appear volume overloaded. Some dry crackles in lungs bases. Last echo 01/2014 with showed normal EF.  --Repeat pending   Signed, Reino Bellis NP-C Pager [phone]

## 2015-12-14 NOTE — Progress Notes (Signed)
[facility] PROGRESS NOTE  Heather Benson [id number] DOB: 01-09-1932 DOA: 12/13/2015   PCP: Vidal Schwalbe, MD     Assessment/Plan: Principal Problem:   CAP (community acquired pneumonia) Active Problems:   PAF (paroxysmal atrial fibrillation) (HCC)   CKD (chronic kidney disease), stage IV ([place])   Essential hypertension   Elevated troponin   Anemia in chronic renal disease   Hyponatremia   Aortic stenosis, mild   Acute diastolic CHF (congestive heart failure) ([place])   82 y.o. female with a past medical history significant for atrial fibrillation on amiodarone and apixaban, HTN, CKD stage IV who presents with cough for 1 week with fever, purulent sputum and dyspnea  1. CAP:  Fever, cough productive of green sputum, hypoxia and new opacity on CXR although subtle.   -Ceftriaxone and azithromycin, day 2  -Tussionex for cough (patient reports this was somewhat helpful) -Probiotic, given diarrhea with Augmentin -Sputum culture, follow blood cultures   urine strep negative  and Legionella antigen pending  -Check BNP and Procalcitonin 0.27 -Incentive spirometry and supplemental oxygen as needed   2. Elevated troponin:  Doubt ACS. -Cardiology consulted, appreciate cares  echocardiogram LV EF: 55% -   60  3. Hyponatremia: resolved  Suspect this is hypovolemic and setting pneumonia.  -Check free water clearance and urine sodium  4. CKD stage IV:  Minimal increase in creatinine from baseline 1.9-2.0. -Monitor BMP while in hospital  5. HTN:  Normotensive at admission. -Continue amlodipine with hold parameters -Continue home furosemide  6. Atrial fibrillation:  CHADS2-VASc 4. On amiodarone and apixaban. -Continue amlodipine -Continue apixaban, appropriately dosed given age and renal function -Check magnesium  7. Anemia of renal disease:  Stable    DVT prophylaxsis eliquis  Code Status:  Full code     Family Communication: Discussed in  detail with the patient, all imaging results, lab results explained to the patient   Disposition Plan:  PT eval      Consultants:  None   Procedures:  None   Antibiotics: Anti-infectives    Start     Dose/Rate Route Frequency Ordered Stop   12/14/15 2200  cefTRIAXone (ROCEPHIN) 1 g in dextrose 5 % 50 mL IVPB     1 g 100 mL/hr over 30 Minutes Intravenous Every 24 hours 12/13/15 2320 12/21/15 2159   12/14/15 2200  azithromycin (ZITHROMAX) tablet 500 mg     500 mg Oral Every 24 hours 12/13/15 2320 12/21/15 2159   12/13/15 1800  cefTRIAXone (ROCEPHIN) 1 g in dextrose 5 % 50 mL IVPB     1 g 100 mL/hr over 30 Minutes Intravenous  Once 12/13/15 1747 12/13/15 2003   12/13/15 1800  azithromycin (ZITHROMAX) 500 mg in dextrose 5 % 250 mL IVPB     500 mg 250 mL/hr over 60 Minutes Intravenous  Once 12/13/15 1747 12/13/15 2154         HPI/Subjective:  very weak, family by the bedside , afebrile   Objective: Vitals:   12/13/15 2331 12/14/15 0322 12/14/15 1030 12/14/15 1412  BP: (!) 147/66 (!) 141/59 134/62 (!) 130/54  Pulse: 64 64  65  Resp: 20 20  20   Temp: 97.7 F (36.5 C) 98.6 F (37 C)  98.7 F (37.1 C)  TempSrc: Oral Oral  Oral  SpO2: 98% 97%  96%  Weight: 99.2 kg (218 lb 11.2 oz) 99.2 kg (218 lb 9.6 oz)    Height: 5\' 6"  (1.676 m)  Intake/Output Summary (Last 24 hours) at 12/14/15 1439 Last data filed at 12/14/15 1410  Gross per 24 hour  Intake              460 ml  Output              525 ml  Net              -65 ml    Exam:  Examination:  General exam: Appears calm and comfortable  Respiratory system: Clear to auscultation. Respiratory effort normal. Cardiovascular system: S1 & S2 heard, RRR. No JVD, murmurs, rubs, gallops or clicks. No pedal edema. Gastrointestinal system: Abdomen is nondistended, soft and nontender. No organomegaly or masses felt. Normal bowel sounds heard. Central nervous system: Alert and oriented. No focal neurological  deficits. Extremities: Symmetric 5 x 5 power. Skin: No rashes, lesions or ulcers Psychiatry: Judgement and insight appear normal. Mood & affect appropriate.     Data Reviewed: I have personally reviewed following labs and imaging studies  Micro Results Recent Results (from the past 240 hour(s))  Blood culture (routine x 2)     Status: None (Preliminary result)   Collection Time: 12/13/15  7:10 PM  Result Value Ref Range Status   Specimen Description BLOOD RIGHT ARM  Final   Special Requests IN PEDIATRIC BOTTLE 1CC  Final   Culture NO GROWTH < 24 HOURS  Final   Report Status PENDING  Incomplete  Blood culture (routine x 2)     Status: None (Preliminary result)   Collection Time: 12/13/15  7:18 PM  Result Value Ref Range Status   Specimen Description BLOOD RIGHT HAND  Final   Special Requests IN PEDIATRIC BOTTLE 1CC  Final   Culture NO GROWTH < 24 HOURS  Final   Report Status PENDING  Incomplete    Radiology Reports Dg Chest 2 View  Result Date: 12/13/2015 CLINICAL DATA:  Shortness of breath. Headaches and diarrhea. Productive cough. EXAM: CHEST  2 VIEW COMPARISON:  Two-view chest x-ray 01/07/202017. FINDINGS: The heart is mildly enlarged. Ill-defined airspace disease at the left base is new. Emphysematous changes are noted. Exaggerated kyphosis of the thoracic spine is stable. A remote superior endplate fracture at 624THL is stable. Atherosclerotic changes are present within the thoracic spine. IMPRESSION: 1. New left lower lobe airspace disease is concerning for pneumonia. 2. Otherwise stable chronic findings. Electronically Signed   By: San Morelle M.D.   On: 12/13/2015 17:32   Dg Chest 2 View  Result Date: 03/08/202017 CLINICAL DATA:  Cardiac care. Productive cough for 1 week. Decreased sleep. EXAM: CHEST  2 VIEW COMPARISON:  Two-view chest x-ray 10/25/2015. FINDINGS: Mild cardiomegaly is present. Increased interstitial markings are present since the prior exam. Increased  pulmonary vascular congestion is noted. Atherosclerotic changes are noted within the aortic arch. Left basilar airspace disease likely reflects atelectasis. There is no effusion. Exaggerated thoracic kyphosis and remote compression fractures are stable. IMPRESSION: 1. Borderline cardiomegaly and increasing interstitial prominence suggesting edema. 2. New left lower lobe airspace disease. While this likely reflects atelectasis, infection is also considered. 3. Atherosclerosis. Electronically Signed   By: San Morelle M.D.   On: 01/07/202017 14:32  Mr Brain Wo Contrast  Result Date: 11/24/2015  [facility] [address] [place], [place] [address] [phone] NEUROIMAGING REPORT STUDY DATE: 11/23/2015 PATIENT NAME: TEREZ BALAZS DOB: 09-01-1931 MRN: [id number] EXAM: MRI Brain without contrast ORDERING CLINICIAN: Andrey Spearman M.D. CLINICAL HISTORY: 82 year old woman with a gait  disturbance, dizziness and orthostatic hypotension COMPARISON FILMS: None TECHNIQUE: MRI of the brain without contrast was obtained utilizing 5 mm axial slices with T1, T2, T2 flair, SWI and diffusion weighted views.  T1 sagittal and T2 coronal views were obtained. CONTRAST: none IMAGING SITE: [facility], [place], [place] FINDINGS: On sagittal images, the spinal cord is imaged caudally to C3 and is normal in caliber.   The contents of the posterior fossa are of normal size and position.   There is a "partially empty sella" with thin pituitary tissue within a normal sized sella turcica.  The optic chiasm appear normal.    The fourth ventricle was normal in size without distortion. The third and lateral ventricles are mildly to moderately enlarged, in proportion to the extent to moderate cortical atrophy. The cortical atrophy is most pronounced in the mesial temporal lobes. There is mild cerebellar and moderate corpus callosal atrophy.  There are no abnormal extra-axial collections of  fluid.  The cerebellum shows a small T2/FLAIR hyperintense focus in the left cerebellar hemisphere .  The brainstem appears normal.   The deep gray matter appears normal.  In the hemispheres, there are some T2/FLAIR flair hyperintense foci, predominantly in the periventricular white matter. None of these appear to be acute.   Diffusion weighted images are normal.  Susceptibility weighted images are normal.  The orbits appear normal.   The VIIth/VIIIth nerve complex appears normal.  The mastoid air cells appear normal.  The paranasal sinuses appear normal.  Flow voids are identified within the major intracerebral arteries.      This MRI of the brain without contrast shows the following: 1.    Moderate cortical atrophy that is a little more pronounced in the mesial temporal lobes. 2.    Mild chronic microvascular ischemic change, predominantly in the periventricular white matter and with a small focus in the left cerebellum.  3.    There are no acute findings. INTERPRETING PHYSICIAN: Richard A. Felecia Shelling, MD, PhD Certified in  Neuroimaging by [employer]   Mr Cervical Spine Wo Contrast  Result Date: 11/24/2015  [facility] [address] [place], [place] [address] [phone] NEUROIMAGING REPORT STUDY DATE: 11/23/2015 PATIENT NAME: SHANIKQUA RUNNING DOB: 06-12-1931 MRN: [id number] EXAM: MRI of the cervical spine ORDERING CLINICIAN: Andrey Spearman M.D. CLINICAL HISTORY: 82 year old woman with a gait disturbance, hyperreflexia and neck pain COMPARISON FILMS: None TECHNIQUE: MRI of the cervical spine was obtained utilizing 3 mm sagittal slices from the posterior fossa down to the T3-4 level with T1, T2 and inversion recovery views. In addition 4 mm axial slices from [id number] down to T1-2 level were included with T2 and gradient echo views. CONTRAST: None IMAGING SITE: [facility], [address], [place], [place] [address] FINDINGS: :  On sagittal images, the  spine is imaged from above the cervicomedullary junction to T2.   The spinal cord is of normal caliber and signal.   There is straightening of the upper cervical spine. There is reduced disc height at C5-C6. There is no spondylolisthesis.   The vertebral bodies have normal signal.  The discs and interspaces were further evaluated on axial views from C2 to T1 as follows: C2 - C3:  The disc and interspace appear normal. C3 - C4:  There is uncovertebral spurring towards the left and mild left facet hypertrophy. The left neural foramen is moderately narrowed. There is no definite nerve root compression though there is some encroachment upon the  exiting left C4 nerve root. C4 - C5:  There is mild disc bulging and mild uncovertebral spurring.   There is a suggestion of a hyperintense focus in the right spinal cord on an echo axial images only not seen on other sequences or planes.  This is more likely to be artifactual.. C5 - C6:  There is a right disc osteophyte complex that distorts the right side of the thecal sac without causing spinal cord compression. There is mild right foraminal narrowing but no nerve root compression. C6 - C7:  The disc and interspace appear normal. C7 - T1:  The disc and interspace appear normal.    This MRI of the cervical spine without contrast shows the following: 1.   At C3-C4, there are predominantly left-sided degenerative changes causing moderate foraminal narrowing. Although there does not appear to be nerve root compression there is some encroachment upon the exiting left C4 nerve root. 2.   At C4-C5 there are degenerative changes nerve root impingement. On the axial gradient echo MEDIC images (image #11) there is a suggestion of hyperintense signal within the right side of the spinal cord but this is not seen on any of the other sequences and is thus more likely to be artifactual than due to myelopathy. 3.   At C5-C6 there is a right disc osteophyte complex distorting the right side  of the thecal sac without causing spinal cord compression or nerve root compression. INTERPRETING PHYSICIAN: Richard A. Felecia Shelling, MD, PhD Certified in  Neuroimaging by [place] [employer]     CBC  Recent Labs Lab 12/10/15 1313 12/13/15 1633 12/14/15 0459  WBC 8.5 9.4 6.7  HGB 11.6* 11.5* 10.7*  HCT 38.4 38.4 36.5  PLT 257 270 231  MCV 82.2 82.8 82.6  MCH 24.8* 24.8* 24.2*  MCHC 30.2 29.9* 29.3*  RDW 16.1* 16.3* 16.0*  LYMPHSABS 2.8  --   --   MONOABS 0.9  --   --   EOSABS 0.2  --   --   BASOSABS 0.0  --   --     Chemistries   Recent Labs Lab 12/10/15 1313 12/13/15 1633 12/13/15 2334 12/14/15 0459  NA 136 131*  --  135  K 4.0 4.1  --  3.9  CL 102 95*  --  99*  CO2 28 27  --  27  GLUCOSE 112* 105*  --  113*  BUN 23* 35*  --  29*  CREATININE 1.89* 2.25*  --  1.95*  CALCIUM 9.5 9.2  --  8.8*  MG  --   --  2.2  --   AST 32  --   --   --   ALT 21  --   --   --   ALKPHOS 83  --   --   --   BILITOT 0.9  --   --   --    ------------------------------------------------------------------------------------------------------------------ estimated creatinine clearance is 26.4 mL/min (by C-G formula based on SCr of 1.95 mg/dL). ------------------------------------------------------------------------------------------------------------------ No results for input(s): HGBA1C in the last 72 hours. ------------------------------------------------------------------------------------------------------------------ No results for input(s): CHOL, HDL, LDLCALC, TRIG, CHOLHDL, LDLDIRECT in the last 72 hours. ------------------------------------------------------------------------------------------------------------------ No results for input(s): TSH, T4TOTAL, T3FREE, THYROIDAB in the last 72 hours.  Invalid input(s): FREET3 ------------------------------------------------------------------------------------------------------------------ No results for input(s): VITAMINB12,  FOLATE, FERRITIN, TIBC, IRON, RETICCTPCT in the last 72 hours.  Coagulation profile No results for input(s): INR, PROTIME in the last 168 hours.  No results for input(s): DDIMER in the last  72 hours.  Cardiac Enzymes  Recent Labs Lab 12/13/15 2334 12/14/15 0836  TROPONINI 0.09* 0.07*   ------------------------------------------------------------------------------------------------------------------ Invalid input(s): POCBNP   CBG: No results for input(s): GLUCAP in the last 168 hours.     Studies: Dg Chest 2 View  Result Date: 12/13/2015 CLINICAL DATA:  Shortness of breath. Headaches and diarrhea. Productive cough. EXAM: CHEST  2 VIEW COMPARISON:  Two-view chest x-ray Sep 13, 202017. FINDINGS: The heart is mildly enlarged. Ill-defined airspace disease at the left base is new. Emphysematous changes are noted. Exaggerated kyphosis of the thoracic spine is stable. A remote superior endplate fracture at 624THL is stable. Atherosclerotic changes are present within the thoracic spine. IMPRESSION: 1. New left lower lobe airspace disease is concerning for pneumonia. 2. Otherwise stable chronic findings. Electronically Signed   By: San Morelle M.D.   On: 12/13/2015 17:32      Lab Results  Component Value Date   HGBA1C (H) 05/26/2010    5.8 (NOTE)                                                                       According to the ADA Clinical Practice Recommendations for 2011, when HbA1c is used as a screening test:   >=6.5%   Diagnostic of Diabetes Mellitus           (if abnormal result  is confirmed)  5.7-6.4%   Increased risk of developing Diabetes Mellitus  References:Diagnosis and Classification of Diabetes Mellitus,Diabetes [id number] 1):S62-S69 and Standards of Medical Care in         Diabetes - 2011,Diabetes [id number]  (Suppl 1):S11-S61.   Lab Results  Component Value Date   [facility]  05/27/2010    98        Total Cholesterol/HDL:CHD Risk Coronary Heart Disease  Risk Table                     Men   Women  1/2 Average Risk   3.4   3.3  Average Risk       5.0   4.4  2 X Average Risk   9.6   7.1  3 X Average Risk  23.4   11.0        Use the calculated Patient Ratio above and the CHD Risk Table to determine the patient's CHD Risk.        ATP III CLASSIFICATION (LDL):  <100     mg/dL   Optimal  100-129  mg/dL   Near or Above                    Optimal  130-159  mg/dL   Borderline  160-189  mg/dL   High  >190     mg/dL   Very High   CREATININE 1.95 (H) 12/14/2015       Scheduled Meds: . amiodarone  200 mg Oral Daily  . amLODipine  2.5 mg Oral Daily  . apixaban  2.5 mg Oral BID  . azithromycin  500 mg Oral Q24H  . cefTRIAXone (ROCEPHIN)  IV  1 g Intravenous Q24H  . chlorpheniramine-HYDROcodone  5 mL Oral BID  . furosemide  20 mg Oral Daily  . pantoprazole  40 mg Oral Daily  .  saccharomyces boulardii  250 mg Oral BID   Continuous Infusions:    LOS: 1 day    Time spent: >30 MINS    [facility]  [facility] Pager [phone]. If 7PM-7AM, please contact night-coverage at [email or web address], password [facility] 12/14/2015, 2:39 PM  LOS: 1 day

## 2015-12-14 NOTE — Discharge Instructions (Signed)

## 2015-12-14 NOTE — Care Management Important Message (Signed)
Important Message  Patient Details  Name: Heather Benson MRN: [id number] Date of Birth: [birth year]   Medicare Important Message Given:  Yes    Loann Quill 12/14/2015, 10:50 AM

## 2015-12-14 NOTE — Progress Notes (Signed)
  Echocardiogram 2D Echocardiogram has been performed.  Heather Benson 12/14/2015, 10:19 AM

## 2015-12-14 NOTE — Progress Notes (Signed)
Lab called with a Troponin of 0.09, will continue to monitor.

## 2015-12-15 DIAGNOSIS — N189 Chronic kidney disease, unspecified: Secondary | ICD-10-CM

## 2015-12-15 DIAGNOSIS — J189 Pneumonia, unspecified organism: Principal | ICD-10-CM

## 2015-12-15 DIAGNOSIS — D631 Anemia in chronic kidney disease: Secondary | ICD-10-CM

## 2015-12-15 LAB — CBC
HEMATOCRIT: 35.3 % — AB (ref 36.0–46.0)
HEMOGLOBIN: 10.6 g/dL — AB (ref 12.0–15.0)
MCH: 24.9 pg — ABNORMAL LOW (ref 26.0–34.0)
MCHC: 30 g/dL (ref 30.0–36.0)
MCV: 82.9 fL (ref 78.0–100.0)
Platelets: 265 10*3/uL (ref 150–400)
RBC: 4.26 MIL/uL (ref 3.87–5.11)
RDW: 16.3 % — AB (ref 11.5–15.5)
WBC: 6.7 10*3/uL (ref 4.0–10.5)

## 2015-12-15 LAB — COMPREHENSIVE METABOLIC PANEL
ALBUMIN: 2.9 g/dL — AB (ref 3.5–5.0)
ALT: 22 U/L (ref 14–54)
ANION GAP: 8 (ref 5–15)
AST: 37 U/L (ref 15–41)
Alkaline Phosphatase: 54 U/L (ref 38–126)
BILIRUBIN TOTAL: 0.3 mg/dL (ref 0.3–1.2)
BUN: 25 mg/dL — AB (ref 6–20)
CHLORIDE: 96 mmol/L — AB (ref 101–111)
CO2: 31 mmol/L (ref 22–32)
Calcium: 8.7 mg/dL — ABNORMAL LOW (ref 8.9–10.3)
Creatinine, Ser: 1.92 mg/dL — ABNORMAL HIGH (ref 0.44–1.00)
GFR calc Af Amer: 27 mL/min — ABNORMAL LOW (ref >60)
GFR calc non Af Amer: 23 mL/min — ABNORMAL LOW (ref >60)
GLUCOSE: 108 mg/dL — AB (ref 65–99)
POTASSIUM: 3.9 mmol/L (ref 3.5–5.1)
SODIUM: 135 mmol/L (ref 135–145)
TOTAL PROTEIN: 6 g/dL — AB (ref 6.5–8.1)

## 2015-12-15 LAB — PROCALCITONIN: Procalcitonin: 0.25 ng/mL

## 2015-12-15 LAB — LEGIONELLA PNEUMOPHILA SEROGP 1 UR AG: L. PNEUMOPHILA SEROGP 1 UR AG: NEGATIVE

## 2015-12-15 MED ORDER — CEFDINIR 300 MG PO CAPS: 300.0000 mg | ORAL_CAPSULE | Freq: Two times a day (BID) | ORAL | 0 refills | Status: AC

## 2015-12-15 MED ORDER — AZITHROMYCIN 500 MG PO TABS: 500.0000 mg | ORAL_TABLET | ORAL | 0 refills | Status: DC

## 2015-12-15 MED ORDER — SACCHAROMYCES BOULARDII 250 MG PO CAPS: 250.0000 mg | ORAL_CAPSULE | Freq: Two times a day (BID) | ORAL | 0 refills | Status: DC

## 2015-12-15 NOTE — Discharge Summary (Signed)
Physician Discharge Summary  Heather Benson MRN: [id number] DOB/AGE: 1931/12/25 82 y.o.  PCP: Cala Bradford, MD   Admit date: 12/13/2015 Discharge date: 12/15/2015  Discharge Diagnoses:    Principal Problem:   CAP (community acquired pneumonia) Active Problems:   PAF (paroxysmal atrial fibrillation) (HCC)   CKD (chronic kidney disease), stage IV (HCC)   Essential hypertension   Elevated troponin   Anemia in chronic renal disease   Hyponatremia   Aortic stenosis, mild   Acute diastolic CHF (congestive heart failure) (HCC)    Follow-up recommendations Follow-up with PCP in 3-5 days , including all  additional recommended appointments as below Follow-up CBC, CMP in 3-5 days       Current Discharge Medication List    START taking these medications   Details  azithromycin (ZITHROMAX) 500 MG tablet Take 1 tablet (500 mg total) by mouth daily. Qty: 7 tablet, Refills: 0    cefdinir (OMNICEF) 300 MG capsule Take 1 capsule (300 mg total) by mouth 2 (two) times daily. Qty: 14 capsule, Refills: 0    saccharomyces boulardii (FLORASTOR) 250 MG capsule Take 1 capsule (250 mg total) by mouth 2 (two) times daily. Qty: 20 capsule, Refills: 0      CONTINUE these medications which have NOT CHANGED   Details  acetaminophen (TYLENOL) 500 MG tablet Take 1,000 mg by mouth every 8 (eight) hours as needed for moderate pain (pain in legs).    amiodarone (PACERONE) 200 MG tablet TAKE 1 TABLET (200 MG TOTAL) BY MOUTH DAILY. Qty: 90 tablet, Refills: 3    amLODipine (NORVASC) 2.5 MG tablet Take 1 tablet (2.5 mg total) by mouth daily. Qty: 90 tablet, Refills: 0    Cholecalciferol (VITAMIN D3) 2000 UNITS capsule Take 2,000 Units by mouth daily.    ELIQUIS 2.5 MG TABS tablet TAKE 1 TABLET (2.5 MG TOTAL) BY MOUTH 2 (TWO) TIMES DAILY. Qty: 180 tablet, Refills: 1    fexofenadine (ALLEGRA) 180 MG tablet Take 180 mg by mouth daily.    fluticasone (FLONASE) 50 MCG/ACT nasal spray Place 1  spray into both nostrils daily as needed for allergies.     furosemide (LASIX) 40 MG tablet Take 20 mg by mouth daily.    latanoprost (XALATAN) 0.005 % ophthalmic solution Place 1 drop into both eyes at bedtime. Refills: 3    meclizine (ANTIVERT) 25 MG tablet Take 12.5 mg by mouth every other day.  Refills: 0    OVER THE COUNTER MEDICATION Take 1 tablet by mouth daily. Move Free Ultra    pantoprazole (PROTONIX) 40 MG tablet Take 40 mg by mouth daily.    tetrahydrozoline 0.05 % ophthalmic solution Place 1 drop into both eyes 2 (two) times daily as needed (for allergy eyes).       STOP taking these medications     chlorpheniramine-HYDROcodone (TUSSIONEX PENNKINETIC ER) 10-8 MG/5ML SUER          Discharge Condition: stable   Discharge Instructions Get Medicines reviewed and adjusted: Please take all your medications with you for your next visit with your Primary MD  Please request your Primary MD to go over all hospital tests and procedure/radiological results at the follow up, please ask your Primary MD to get all Hospital records sent to his/her office.  If you experience worsening of your admission symptoms, develop shortness of breath, life threatening emergency, suicidal or homicidal thoughts you must seek medical attention immediately by calling 911 or calling your MD immediately if symptoms less severe.  You  must read complete instructions/literature along with all the possible adverse reactions/side effects for all the Medicines you take and that have been prescribed to you. Take any new Medicines after you have completely understood and accpet all the possible adverse reactions/side effects.   Do not drive when taking Pain medications.   Do not take more than prescribed Pain, Sleep and Anxiety Medications  Special Instructions: If you have smoked or chewed Tobacco in the last 2 yrs please stop smoking, stop any regular Alcohol and or any Recreational drug  use.  Wear Seat belts while driving.  Please note  You were cared for by a hospitalist during your hospital stay. Once you are discharged, your primary care physician will handle any further medical issues. Please note that NO REFILLS for any discharge medications will be authorized once you are discharged, as it is imperative that you return to your primary care physician (or establish a relationship with a primary care physician if you do not have one) for your aftercare needs so that they can reassess your need for medications and monitor your lab values.  Discharge Instructions    Diet - low sodium heart healthy    Complete by:  As directed   Increase activity slowly    Complete by:  As directed       Allergies  Allergen Reactions  . Hydromorphone Nausea And Vomiting  . Codeine Other (See Comments)    "Spaced out feeling"  . Augmentin [Amoxicillin-Pot Clavulanate] Diarrhea and Nausea Only  . Boniva [Ibandronic Acid] Diarrhea  . Latex Other (See Comments)    Unknown  . Lisinopril Cough  . Tape Itching and Rash      Disposition: 01-Home or Self Care   Consults:  Cardiology     Significant Diagnostic Studies:  Dg Chest 2 View  Result Date: 12/13/2015 CLINICAL DATA:  Shortness of breath. Headaches and diarrhea. Productive cough. EXAM: CHEST  2 VIEW COMPARISON:  Two-view chest x-ray 02-Jun-202017. FINDINGS: The heart is mildly enlarged. Ill-defined airspace disease at the left base is new. Emphysematous changes are noted. Exaggerated kyphosis of the thoracic spine is stable. A remote superior endplate fracture at O84 is stable. Atherosclerotic changes are present within the thoracic spine. IMPRESSION: 1. New left lower lobe airspace disease is concerning for pneumonia. 2. Otherwise stable chronic findings. Electronically Signed   By: San Morelle M.D.   On: 12/13/2015 17:32   Dg Chest 2 View  Result Date: 2020/03/2816 CLINICAL DATA:  Cardiac care. Productive cough for 1  week. Decreased sleep. EXAM: CHEST  2 VIEW COMPARISON:  Two-view chest x-ray 10/25/2015. FINDINGS: Mild cardiomegaly is present. Increased interstitial markings are present since the prior exam. Increased pulmonary vascular congestion is noted. Atherosclerotic changes are noted within the aortic arch. Left basilar airspace disease likely reflects atelectasis. There is no effusion. Exaggerated thoracic kyphosis and remote compression fractures are stable. IMPRESSION: 1. Borderline cardiomegaly and increasing interstitial prominence suggesting edema. 2. New left lower lobe airspace disease. While this likely reflects atelectasis, infection is also considered. 3. Atherosclerosis. Electronically Signed   By: San Morelle M.D.   On: 02-Jun-202017 14:32  Mr Brain Wo Contrast  Result Date: 11/24/2015  [facility] [address] [place], [place] [address] [phone] NEUROIMAGING REPORT STUDY DATE: 11/23/2015 PATIENT NAME: Heather Benson DOB: 1931-07-20 MRN: [id number] EXAM: MRI Brain without contrast ORDERING CLINICIAN: Andrey Spearman M.D. CLINICAL HISTORY: 82 year old woman with a gait disturbance, dizziness and orthostatic hypotension COMPARISON FILMS: None TECHNIQUE:  MRI of the brain without contrast was obtained utilizing 5 mm axial slices with T1, T2, T2 flair, SWI and diffusion weighted views.  T1 sagittal and T2 coronal views were obtained. CONTRAST: none IMAGING SITE: [facility], [place], [place] FINDINGS: On sagittal images, the spinal cord is imaged caudally to C3 and is normal in caliber.   The contents of the posterior fossa are of normal size and position.   There is a "partially empty sella" with thin pituitary tissue within a normal sized sella turcica.  The optic chiasm appear normal.    The fourth ventricle was normal in size without distortion. The third and lateral ventricles are mildly to moderately enlarged, in proportion to the extent to  moderate cortical atrophy. The cortical atrophy is most pronounced in the mesial temporal lobes. There is mild cerebellar and moderate corpus callosal atrophy.  There are no abnormal extra-axial collections of fluid.  The cerebellum shows a small T2/FLAIR hyperintense focus in the left cerebellar hemisphere .  The brainstem appears normal.   The deep gray matter appears normal.  In the hemispheres, there are some T2/FLAIR flair hyperintense foci, predominantly in the periventricular white matter. None of these appear to be acute.   Diffusion weighted images are normal.  Susceptibility weighted images are normal.  The orbits appear normal.   The VIIth/VIIIth nerve complex appears normal.  The mastoid air cells appear normal.  The paranasal sinuses appear normal.  Flow voids are identified within the major intracerebral arteries.      This MRI of the brain without contrast shows the following: 1.    Moderate cortical atrophy that is a little more pronounced in the mesial temporal lobes. 2.    Mild chronic microvascular ischemic change, predominantly in the periventricular white matter and with a small focus in the left cerebellum.  3.    There are no acute findings. INTERPRETING PHYSICIAN: Richard A. Felecia Shelling, MD, PhD Certified in  Neuroimaging by [employer] [employer]   Mr Cervical Spine Wo Contrast  Result Date: 11/24/2015  [facility] [address] [place], [place] [address] [phone] NEUROIMAGING REPORT STUDY DATE: 11/23/2015 PATIENT NAME: Heather Benson DOB: 05/19/1931 MRN: [id number] EXAM: MRI of the cervical spine ORDERING CLINICIAN: Andrey Spearman M.D. CLINICAL HISTORY: 82 year old woman with a gait disturbance, hyperreflexia and neck pain COMPARISON FILMS: None TECHNIQUE: MRI of the cervical spine was obtained utilizing 3 mm sagittal slices from the posterior fossa down to the T3-4 level with T1, T2 and inversion recovery views. In addition 4 mm axial slices from  F6-4 down to T1-2 level were included with T2 and gradient echo views. CONTRAST: None IMAGING SITE: [facility], [address], [place], [place] [address] FINDINGS: :  On sagittal images, the spine is imaged from above the cervicomedullary junction to T2.   The spinal cord is of normal caliber and signal.   There is straightening of the upper cervical spine. There is reduced disc height at C5-C6. There is no spondylolisthesis.   The vertebral bodies have normal signal.  The discs and interspaces were further evaluated on axial views from C2 to T1 as follows: C2 - C3:  The disc and interspace appear normal. C3 - C4:  There is uncovertebral spurring towards the left and mild left facet hypertrophy. The left neural foramen is moderately narrowed. There is no definite nerve root compression though there is some encroachment upon the exiting left C4 nerve root. C4 - C5:  There is mild disc bulging and mild uncovertebral spurring.   There is a suggestion of a hyperintense focus in the right spinal cord on an echo axial images only not seen on other sequences or planes.  This is more likely to be artifactual.. C5 - C6:  There is a right disc osteophyte complex that distorts the right side of the thecal sac without causing spinal cord compression. There is mild right foraminal narrowing but no nerve root compression. C6 - C7:  The disc and interspace appear normal. C7 - T1:  The disc and interspace appear normal.    This MRI of the cervical spine without contrast shows the following: 1.   At C3-C4, there are predominantly left-sided degenerative changes causing moderate foraminal narrowing. Although there does not appear to be nerve root compression there is some encroachment upon the exiting left C4 nerve root. 2.   At C4-C5 there are degenerative changes nerve root impingement. On the axial gradient echo MEDIC images (image #11) there is a suggestion of hyperintense signal within the right side of the  spinal cord but this is not seen on any of the other sequences and is thus more likely to be artifactual than due to myelopathy. 3.   At C5-C6 there is a right disc osteophyte complex distorting the right side of the thecal sac without causing spinal cord compression or nerve root compression. INTERPRETING PHYSICIAN: Richard A. Felecia Shelling, MD, PhD Certified in  Neuroimaging by [place] [employer]    eLV EF: 55% -   60%  ------------------------------------------------------------------- Indications:      Dyspnea 786.09.  ------------------------------------------------------------------- History:   PMH:  Bradycardia. Elevated troponin. CKD.  Atrial fibrillation.  Risk factors:  Hypertension.  ------------------------------------------------------------------- Study Conclusions  - Left ventricle: The cavity size was normal. There was moderate   concentric hypertrophy. Systolic function was normal. The   estimated ejection fraction was in the range of 55% to 60%. Left   ventricular diastolic function parameters were normal. - Aortic valve: There was very mild stenosis. There was trivial   regurgitation. Valve area (VTI): 1.23 cm^2. Valve area (Vmax):   1.51 cm^2. Valve area (Vmean): 1.27 cm^2. - Mitral valve: Calcified annulus. There was mild regurgitation. - Atrial septum: No defect or patent foramen ovale was identifiedcho    [employer]   12/13/15 2331 12/14/15 0322 12/15/15 0554  Weight: 99.2 kg (218 lb 11.2 oz) 99.2 kg (218 lb 9.6 oz) 99.1 kg (218 lb 8 oz)     Microbiology: Recent Results (from the past 240 hour(s))  Blood culture (routine x 2)     Status: None (Preliminary result)   Collection Time: 12/13/15  7:10 PM  Result Value Ref Range Status   Specimen Description BLOOD RIGHT ARM  Final   Special Requests IN PEDIATRIC BOTTLE 1CC  Final   Culture NO GROWTH 2 DAYS  Final   Report Status PENDING  Incomplete  Blood culture (routine x 2)     Status: None  (Preliminary result)   Collection Time: 12/13/15  7:18 PM  Result Value Ref Range Status   Specimen Description BLOOD RIGHT HAND  Final   Special Requests IN PEDIATRIC BOTTLE 1CC  Final   Culture NO GROWTH 2 DAYS  Final   Report Status PENDING  Incomplete       Blood Culture    Component Value Date/Time   SDES BLOOD RIGHT HAND 12/13/2015 1918   SPECREQUEST IN PEDIATRIC BOTTLE [place] 12/13/2015 1918   CULT NO  GROWTH 2 DAYS 12/13/2015 1918   REPTSTATUS PENDING 12/13/2015 1918      Labs: Results for orders placed or performed during the hospital encounter of 12/13/15 (from the past 48 hour(s))  I-stat troponin, ED     Status: Abnormal   Collection Time: 12/13/15  5:22 PM  Result Value Ref Range   Troponin i, poc 0.12 (HH) 0.00 - 0.08 ng/mL   Comment NOTIFIED PHYSICIAN    Comment 3            Comment: Due to the release kinetics of cTnI, a negative result within the first hours of the onset of symptoms does not rule out myocardial infarction with certainty. If myocardial infarction is still suspected, repeat the test at appropriate intervals.   I-Stat arterial blood gas, ED     Status: Abnormal   Collection Time: 12/13/15  6:24 PM  Result Value Ref Range   pH, Arterial 7.395 7.350 - 7.450   pCO2 arterial 45.5 (H) 35.0 - 45.0 mmHg   pO2, Arterial 94.0 80.0 - 100.0 mmHg   Bicarbonate 28.0 (H) 20.0 - 24.0 mEq/L   TCO2 29 0 - 100 mmol/L   O2 Saturation 97.0 %   Acid-Base Excess 2.0 0.0 - 2.0 mmol/L   Patient temperature 97.8 F    Collection site RADIAL, ALLEN'S TEST ACCEPTABLE    Drawn by Operator    Sample type ARTERIAL   Blood culture (routine x 2)     Status: None (Preliminary result)   Collection Time: 12/13/15  7:10 PM  Result Value Ref Range   Specimen Description BLOOD RIGHT ARM    Special Requests IN PEDIATRIC BOTTLE 1CC    Culture NO GROWTH 2 DAYS    Report Status PENDING   Blood culture (routine x 2)     Status: None (Preliminary result)   Collection Time:  12/13/15  7:18 PM  Result Value Ref Range   Specimen Description BLOOD RIGHT HAND    Special Requests IN PEDIATRIC BOTTLE 1CC    Culture NO GROWTH 2 DAYS    Report Status PENDING   Strep pneumoniae urinary antigen     Status: None   Collection Time: 12/13/15 11:21 PM  Result Value Ref Range   Strep Pneumo Urinary Antigen NEGATIVE NEGATIVE    Comment:        Infection due to S. pneumoniae cannot be absolutely ruled out since the antigen present may be below the detection limit of the test.   Legionella Pneumophila Serogp 1 Ur Ag     Status: None   Collection Time: 12/13/15 11:21 PM  Result Value Ref Range   L. pneumophila Serogp 1 Ur Ag Negative Negative    Comment: (NOTE) Presumptive negative for L. pneumophila serogroup 1 antigen in urine, suggesting no recent or current infection. Legionnaires' disease cannot be ruled out since other serogroups and species may also cause disease. Performed At: [facility] [place], [place] [id number] Lindon Romp MD [id number]    Source of Sample URINE, RANDOM   Osmolality, urine     Status: Abnormal   Collection Time: 12/13/15 11:21 PM  Result Value Ref Range   Osmolality, Ur 297 (L) 300 - 900 mOsm/kg  Sodium, urine, random     Status: None   Collection Time: 12/13/15 11:21 PM  Result Value Ref Range   Sodium, Ur 29 mmol/L  Procalcitonin - Baseline     Status: None   Collection Time: 12/13/15 11:34 PM  Result Value  Ref Range   Procalcitonin 0.27 ng/mL    Comment:        Interpretation: PCT (Procalcitonin) <= 0.5 ng/mL: Systemic infection (sepsis) is not likely. Local bacterial infection is possible. (NOTE)         ICU PCT Algorithm               Non ICU PCT Algorithm    ----------------------------     ------------------------------         PCT < 0.25 ng/mL                 PCT < 0.1 ng/mL     Stopping of antibiotics            Stopping of antibiotics       strongly encouraged.                strongly encouraged.    ----------------------------     ------------------------------       PCT level decrease by               PCT < 0.25 ng/mL       >= 80% from peak PCT       OR PCT 0.25 - 0.5 ng/mL          Stopping of antibiotics                                             encouraged.     Stopping of antibiotics           encouraged.    ----------------------------     ------------------------------       PCT level decrease by              PCT >= 0.25 ng/mL       < 80% from peak PCT        AND PCT >= 0.5 ng/mL            Continuin g antibiotics                                              encouraged.       Continuing antibiotics            encouraged.    ----------------------------     ------------------------------     PCT level increase compared          PCT > 0.5 ng/mL         with peak PCT AND          PCT >= 0.5 ng/mL             Escalation of antibiotics                                          strongly encouraged.      Escalation of antibiotics        strongly encouraged.   Brain natriuretic peptide     Status: Abnormal   Collection Time: 12/13/15 11:34 PM  Result Value Ref Range   B Natriuretic Peptide 211.0 (H) 0.0 - 100.0 pg/mL  Troponin I     Status: Abnormal  Collection Time: 12/13/15 11:34 PM  Result Value Ref Range   Troponin I 0.09 (HH) <0.03 ng/mL    Comment: CRITICAL RESULT CALLED TO, READ BACK BY AND VERIFIED WITH: ALI V,RN 12/14/15 0041 WAYK   Magnesium     Status: None   Collection Time: 12/13/15 11:34 PM  Result Value Ref Range   Magnesium 2.2 1.7 - 2.4 mg/dL  Osmolality     Status: None   Collection Time: 12/13/15 11:34 PM  Result Value Ref Range   Osmolality 289 275 - 295 mOsm/kg  Basic metabolic panel     Status: Abnormal   Collection Time: 12/14/15  4:59 AM  Result Value Ref Range   Sodium 135 135 - 145 mmol/L   Potassium 3.9 3.5 - 5.1 mmol/L   Chloride 99 (L) 101 - 111 mmol/L   CO2 27 22 - 32 mmol/L   Glucose, Bld 113 (H) 65 - 99  mg/dL   BUN 29 (H) 6 - 20 mg/dL   Creatinine, Ser 1.95 (H) 0.44 - 1.00 mg/dL   Calcium 8.8 (L) 8.9 - 10.3 mg/dL   GFR calc non Af Amer 23 (L) >60 mL/min   GFR calc Af Amer 26 (L) >60 mL/min    Comment: (NOTE) The eGFR has been calculated using the CKD EPI equation. This calculation has not been validated in all clinical situations. eGFR's persistently <60 mL/min signify possible Chronic Kidney Disease.    Anion gap 9 5 - 15  CBC     Status: Abnormal   Collection Time: 12/14/15  4:59 AM  Result Value Ref Range   WBC 6.7 4.0 - 10.5 K/uL   RBC 4.42 3.87 - 5.11 MIL/uL   Hemoglobin 10.7 (L) 12.0 - 15.0 g/dL   HCT 36.5 36.0 - 46.0 %   MCV 82.6 78.0 - 100.0 fL   MCH 24.2 (L) 26.0 - 34.0 pg   MCHC 29.3 (L) 30.0 - 36.0 g/dL   RDW 16.0 (H) 11.5 - 15.5 %   Platelets 231 150 - 400 K/uL  Troponin I     Status: Abnormal   Collection Time: 12/14/15  8:36 AM  Result Value Ref Range   Troponin I 0.07 (HH) <0.03 ng/mL    Comment: CRITICAL VALUE NOTED.  VALUE IS CONSISTENT WITH PREVIOUSLY REPORTED AND CALLED VALUE.  Procalcitonin     Status: None   Collection Time: 12/15/15  4:30 AM  Result Value Ref Range   Procalcitonin 0.25 ng/mL    Comment:        Interpretation: PCT (Procalcitonin) <= 0.5 ng/mL: Systemic infection (sepsis) is not likely. Local bacterial infection is possible. (NOTE)         ICU PCT Algorithm               Non ICU PCT Algorithm    ----------------------------     ------------------------------         PCT < 0.25 ng/mL                 PCT < 0.1 ng/mL     Stopping of antibiotics            Stopping of antibiotics       strongly encouraged.               strongly encouraged.    ----------------------------     ------------------------------       PCT level decrease by  PCT < 0.25 ng/mL       >= 80% from peak PCT       OR PCT 0.25 - 0.5 ng/mL          Stopping of antibiotics                                             encouraged.     Stopping of  antibiotics           encouraged.    ----------------------------     ------------------------------       PCT level decrease by              PCT >= 0.25 ng/mL       < 80% from peak PCT        AND PCT >= 0.5 ng/mL            Continuin g antibiotics                                              encouraged.       Continuing antibiotics            encouraged.    ----------------------------     ------------------------------     PCT level increase compared          PCT > 0.5 ng/mL         with peak PCT AND          PCT >= 0.5 ng/mL             Escalation of antibiotics                                          strongly encouraged.      Escalation of antibiotics        strongly encouraged.   Comprehensive metabolic panel     Status: Abnormal   Collection Time: 12/15/15  4:30 AM  Result Value Ref Range   Sodium 135 135 - 145 mmol/L   Potassium 3.9 3.5 - 5.1 mmol/L   Chloride 96 (L) 101 - 111 mmol/L   CO2 31 22 - 32 mmol/L   Glucose, Bld 108 (H) 65 - 99 mg/dL   BUN 25 (H) 6 - 20 mg/dL   Creatinine, Ser 1.92 (H) 0.44 - 1.00 mg/dL   Calcium 8.7 (L) 8.9 - 10.3 mg/dL   Total Protein 6.0 (L) 6.5 - 8.1 g/dL   Albumin 2.9 (L) 3.5 - 5.0 g/dL   AST 37 15 - 41 U/L   ALT 22 14 - 54 U/L   Alkaline Phosphatase 54 38 - 126 U/L   Total Bilirubin 0.3 0.3 - 1.2 mg/dL   GFR calc non Af Amer 23 (L) >60 mL/min   GFR calc Af Amer 27 (L) >60 mL/min    Comment: (NOTE) The eGFR has been calculated using the CKD EPI equation. This calculation has not been validated in all clinical situations. eGFR's persistently <60 mL/min signify possible Chronic Kidney Disease.    Anion gap 8 5 - 15  CBC     Status: Abnormal   Collection Time: 12/15/15  4:30 AM  Result Value Ref Range   WBC 6.7 4.0 - 10.5 K/uL   RBC 4.26 3.87 - 5.11 MIL/uL   Hemoglobin 10.6 (L) 12.0 - 15.0 g/dL   HCT 35.3 (L) 36.0 - 46.0 %   MCV 82.9 78.0 - 100.0 fL   MCH 24.9 (L) 26.0 - 34.0 pg   MCHC 30.0 30.0 - 36.0 g/dL   RDW 16.3 (H) 11.5  - 15.5 %   Platelets 265 150 - 400 K/uL     Lipid Panel     Component Value Date/Time   CHOL  05/27/2010 0505    168        ATP III CLASSIFICATION:  <200     mg/dL   Desirable  200-239  mg/dL   Borderline High  >=240    mg/dL   High          TRIG 95 05/27/2010 0505   HDL 51 05/27/2010 0505   CHOLHDL 3.3 05/27/2010 0505   VLDL 19 05/27/2010 0505   LDLCALC  05/27/2010 0505    98        Total Cholesterol/HDL:CHD Risk Coronary Heart Disease Risk Table                     Men   Women  1/2 Average Risk   3.4   3.3  Average Risk       5.0   4.4  2 X Average Risk   9.6   7.1  3 X Average Risk  23.4   11.0        Use the calculated Patient Ratio above and the CHD Risk Table to determine the patient's CHD Risk.        ATP III CLASSIFICATION (LDL):  <100     mg/dL   Optimal  100-129  mg/dL   Near or Above                    Optimal  130-159  mg/dL   Borderline  160-189  mg/dL   High  >190     mg/dL   Very High     Lab Results  Component Value Date   HGBA1C (H) 05/26/2010    5.8 (NOTE)                                                                       According to the ADA Clinical Practice Recommendations for 2011, when HbA1c is used as a screening test:   >=6.5%   Diagnostic of Diabetes Mellitus           (if abnormal result  is confirmed)  5.7-6.4%   Increased risk of developing Diabetes Mellitus  References:Diagnosis and Classification of Diabetes Mellitus,Diabetes ZOXW,9604,54(UJWJX 1):S62-S69 and Standards of Medical Care in         Diabetes - 2011,Diabetes Care,2011,34  (Suppl 1):S11-S61.     Lab Results  Component Value Date   [facility]  05/27/2010    98        Total Cholesterol/HDL:CHD Risk Coronary Heart Disease Risk Table                     Men   Women  1/2 Average Risk  3.4   3.3  Average Risk       5.0   4.4  2 X Average Risk   9.6   7.1  3 X Average Risk  23.4   11.0        Use the calculated Patient Ratio above and the CHD Risk Table to  determine the patient's CHD Risk.        ATP III CLASSIFICATION (LDL):  <100     mg/dL   Optimal  100-129  mg/dL   Near or Above                    Optimal  130-159  mg/dL   Borderline  160-189  mg/dL   High  >190     mg/dL   Very High   CREATININE 1.92 (H) 12/15/2015     HPI :*  31 pleasant [place] woman with history of paroxysmal AF (on Amiodarone for about two years), CHA2DS2-VASc score at least 4 (gender, age, HTN; on Eliquis) presented to ER for evaluation. Her main complaint to me is diarrhea for about two weeks (loose stools 3-4 times per day), severe headache and bouts of severe cough associated with yellow sputum. No fever, N/V, syncope, CP, orthopnea, PND. Has chronic bilateral lower extremity edema without any change recently. In ER, being diagnosed with LLL pneumonia. She also seems to have AKI on CKD with Cr up to 2.25 today (was 1.89 three days ago  HOSPITAL COURSE:   1. CAP: Fever, cough productive of green sputum, hypoxia and new opacity on CXR on admission.  -given Ceftriaxone and azithromycin, day 3, now switched to azithromycin and omnicef for 7 days   -Tussionex for cough   -Probiotic, given diarrhea with Augmentin -Sputum culture, follow blood cultures, NGSF    urine strep negative  and Legionella antigen pending    BNP 211 and Procalcitonin 0.27 -Incentive spirometry and supplemental oxygen as needed PT OT evaluation recommend SNF Patient Saturations on Room Air while Ambulating = 96%  2. Elevated troponin: Did have mild elevation in POC trop on admission with downward trend. 0.13>>0.09. Denies having had any episodes of chest pain prior or during this admission. At this time not likely to be ACS. 2/2 demand ischemia from CAP. 2D echo with normal LVF and EKG with nonspecific ST abnormality similar to prior EKGs. No further ischemic workup at this time.  follow-up with Dr. Irish Lack as outpatient.   3. Hyponatremia:resolved  Suspect this is hypovolemic and  setting pneumonia. Vs SIADH    4. CKD stage IV: Minimal increase in creatinine from baseline 1.9-2.0. -Monitor BMP    5. HTN: Normotensive at admission. -Continue amlodipine with hold parameters -Continue home furosemide  6. Atrial fibrillation: CHADS2-VASc 4. On amiodarone and apixaban. -Continue amlodipine -Continue apixaban, appropriately dosed given age and renal function -Check magnesium  7. Anemia of renal disease: Stable  8.Diastolic BJY:NWGN not appear volume overloaded.Repeat echo with normal LVF EF 55-60% and moderate LVH, mild AS, mild MR. Cardiology has signed off   Discharge Exam: *Blood pressure (!) 108/49, pulse 72, temperature 98.9 F (37.2 C), temperature source Oral, resp. rate 19, height '5\' 6"'$  (1.676 m), weight 99.1 kg (218 lb 8 oz), SpO2 92 %.  General exam: Appears calm and comfortable  Respiratory system: Clear to auscultation. Respiratory effort normal. Cardiovascular system: S1 & S2 heard, RRR. No JVD, murmurs, rubs, gallops or clicks. No pedal edema. Gastrointestinal system: Abdomen is nondistended, soft and nontender. No organomegaly or  masses felt. Normal bowel sounds heard. Central nervous system: Alert and oriented. No focal neurological deficits. Extremities: Symmetric 5 x 5 power. Skin: No rashes, lesions or ulcers Psychiatry: Judgement and insight appear normal. Mood & affect appropriate    Follow-up Information    [facility] .   Why:  They will do your home health care at your home Contact information: [address] [place] [address] [phone]        Vidal Schwalbe, MD. Schedule an appointment as soon as possible for a visit in 2 week(s).   Specialty:  Family Medicine Contact information: [place] [address] [phone]           Signed: Reyne Dumas 12/15/2015, 4:57 PM        Time spent >45 mins

## 2015-12-15 NOTE — Progress Notes (Addendum)
SUBJECTIVE:  Denies any chest pain   OBJECTIVE:   Vitals:   Vitals:   12/14/15 1412 12/14/15 2223 12/15/15 0554 12/15/15 0901  BP: (!) 130/54 (!) 128/49 139/69 (!) 121/52  Pulse: 65 72 77   Resp: 20 20 19    Temp: 98.7 F (37.1 C) 98.7 F (37.1 C) 98.9 F (37.2 C)   TempSrc: Oral Oral Oral   SpO2: 96% 94% 93%   Weight:   218 lb 8 oz (99.1 kg)   Height:       I&O's:   Intake/Output Summary (Last 24 hours) at 12/15/15 1022 Last data filed at 12/15/15 0907  Gross per 24 hour  Intake              460 ml  Output             1050 ml  Net             -590 ml   TELEMETRY: Reviewed telemetry pt in NSR:     PHYSICAL EXAM General: Well developed, well nourished, in no acute distress Head: Eyes PERRLA, No xanthomas.   Normal cephalic and atramatic  Lungs:   Crackles at left base Heart:   HRRR S1 S2 Pulses are 2+ & equal. Abdomen: Bowel sounds are positive, abdomen soft and non-tender without masses  Msk:  Back normal, normal gait. Normal strength and tone for age. Extremities:   No clubbing, cyanosis or edema.  DP +1 Neuro: Alert and oriented X 3. Psych:  Good affect, responds appropriately   LABS: Basic Metabolic Panel:  Recent Labs  12/13/15 2334 12/14/15 0459 12/15/15 0430  NA  --  135 135  K  --  3.9 3.9  CL  --  99* 96*  CO2  --  27 31  GLUCOSE  --  113* 108*  BUN  --  29* 25*  CREATININE  --  1.95* 1.92*  CALCIUM  --  8.8* 8.7*  MG 2.2  --   --    Liver Function Tests:  Recent Labs  12/15/15 0430  AST 37  ALT 22  ALKPHOS 54  BILITOT 0.3  PROT 6.0*  ALBUMIN 2.9*   No results for input(s): LIPASE, AMYLASE in the last 72 hours. CBC:  Recent Labs  12/14/15 0459 12/15/15 0430  WBC 6.7 6.7  HGB 10.7* 10.6*  HCT 36.5 35.3*  MCV 82.6 82.9  PLT 231 265   Cardiac Enzymes:  Recent Labs  12/13/15 2334 12/14/15 0836  TROPONINI 0.09* 0.07*   BNP: Invalid input(s): POCBNP D-Dimer: No results for input(s): DDIMER in the last 72  hours. Hemoglobin A1C: No results for input(s): HGBA1C in the last 72 hours. Fasting Lipid Panel: No results for input(s): CHOL, HDL, LDLCALC, TRIG, CHOLHDL, LDLDIRECT in the last 72 hours. Thyroid Function Tests: No results for input(s): TSH, T4TOTAL, T3FREE, THYROIDAB in the last 72 hours.  Invalid input(s): FREET3 Anemia Panel: No results for input(s): VITAMINB12, FOLATE, FERRITIN, TIBC, IRON, RETICCTPCT in the last 72 hours. Coag Panel:   Lab Results  Component Value Date   INR 1.3 (H) 08/02/2014   INR 2.22 (H) 02/18/2011   INR 1.83 (H) 02/17/2011    RADIOLOGY: Dg Chest 2 View  Result Date: 12/13/2015 CLINICAL DATA:  Shortness of breath. Headaches and diarrhea. Productive cough. EXAM: CHEST  2 VIEW COMPARISON:  Two-view chest x-ray 01-21-2016. FINDINGS: The heart is mildly enlarged. Ill-defined airspace disease at the left base is new. Emphysematous changes are noted. Exaggerated kyphosis  of the thoracic spine is stable. A remote superior endplate fracture at [id number] is stable. Atherosclerotic changes are present within the thoracic spine. IMPRESSION: 1. New left lower lobe airspace disease is concerning for pneumonia. 2. Otherwise stable chronic findings. Electronically Signed   By: San Morelle M.D.   On: 12/13/2015 17:32   Dg Chest 2 View  Result Date: 03/03/202017 CLINICAL DATA:  Cardiac care. Productive cough for 1 week. Decreased sleep. EXAM: CHEST  2 VIEW COMPARISON:  Two-view chest x-ray 10/25/2015. FINDINGS: Mild cardiomegaly is present. Increased interstitial markings are present since the prior exam. Increased pulmonary vascular congestion is noted. Atherosclerotic changes are noted within the aortic arch. Left basilar airspace disease likely reflects atelectasis. There is no effusion. Exaggerated thoracic kyphosis and remote compression fractures are stable. IMPRESSION: 1. Borderline cardiomegaly and increasing interstitial prominence suggesting edema. 2. New left lower lobe  airspace disease. While this likely reflects atelectasis, infection is also considered. 3. Atherosclerosis. Electronically Signed   By: San Morelle M.D.   On: 003/03/202017 14:32  Mr Brain Wo Contrast  Result Date: 11/24/2015  [facility] [address], [place], [place] [address] [phone] NEUROIMAGING REPORT STUDY DATE: 11/23/2015 PATIENT NAME: Heather Benson DOB: 08-27-1931 MRN: [id number] EXAM: MRI Brain without contrast ORDERING CLINICIAN: Andrey Spearman M.D. CLINICAL HISTORY: 82 year old woman with a gait disturbance, dizziness and orthostatic hypotension COMPARISON FILMS: None TECHNIQUE: MRI of the brain without contrast was obtained utilizing 5 mm axial slices with T1, T2, T2 flair, SWI and diffusion weighted views.  T1 sagittal and T2 coronal views were obtained. CONTRAST: none IMAGING SITE: [facility], [place], [place] FINDINGS: On sagittal images, the spinal cord is imaged caudally to C3 and is normal in caliber.   The contents of the posterior fossa are of normal size and position.   There is a "partially empty sella" with thin pituitary tissue within a normal sized sella turcica.  The optic chiasm appear normal.    The fourth ventricle was normal in size without distortion. The third and lateral ventricles are mildly to moderately enlarged, in proportion to the extent to moderate cortical atrophy. The cortical atrophy is most pronounced in the mesial temporal lobes. There is mild cerebellar and moderate corpus callosal atrophy.  There are no abnormal extra-axial collections of fluid.  The cerebellum shows a small T2/FLAIR hyperintense focus in the left cerebellar hemisphere .  The brainstem appears normal.   The deep gray matter appears normal.  In the hemispheres, there are some T2/FLAIR flair hyperintense foci, predominantly in the periventricular white matter. None of these appear to be acute.   Diffusion weighted images are normal.   Susceptibility weighted images are normal.  The orbits appear normal.   The VIIth/VIIIth nerve complex appears normal.  The mastoid air cells appear normal.  The paranasal sinuses appear normal.  Flow voids are identified within the major intracerebral arteries.      This MRI of the brain without contrast shows the following: 1.    Moderate cortical atrophy that is a little more pronounced in the mesial temporal lobes. 2.    Mild chronic microvascular ischemic change, predominantly in the periventricular white matter and with a small focus in the left cerebellum.  3.    There are no acute findings. INTERPRETING PHYSICIAN: Richard A. Felecia Shelling, MD, PhD Certified in  Neuroimaging by [employer]   Mr Cervical Spine Wo Contrast  Result Date: 11/24/2015  [facility] [id number]  [address], [place], [place] [address] [phone] NEUROIMAGING REPORT STUDY DATE: 11/23/2015 PATIENT NAME: Heather Benson DOB: 22-Jan-1932 MRN: [id number] EXAM: MRI of the cervical spine ORDERING CLINICIAN: Andrey Spearman M.D. CLINICAL HISTORY: 82 year old woman with a gait disturbance, hyperreflexia and neck pain COMPARISON FILMS: None TECHNIQUE: MRI of the cervical spine was obtained utilizing 3 mm sagittal slices from the posterior fossa down to the T3-4 level with T1, T2 and inversion recovery views. In addition 4 mm axial slices from [id number] down to T1-2 level were included with T2 and gradient echo views. CONTRAST: None IMAGING SITE: [facility], [address], [place], [place] [address] FINDINGS: :  On sagittal images, the spine is imaged from above the cervicomedullary junction to T2.   The spinal cord is of normal caliber and signal.   There is straightening of the upper cervical spine. There is reduced disc height at C5-C6. There is no spondylolisthesis.   The vertebral bodies have normal signal.  The discs and interspaces were further evaluated on axial views from C2 to T1 as  follows: C2 - C3:  The disc and interspace appear normal. C3 - C4:  There is uncovertebral spurring towards the left and mild left facet hypertrophy. The left neural foramen is moderately narrowed. There is no definite nerve root compression though there is some encroachment upon the exiting left C4 nerve root. C4 - C5:  There is mild disc bulging and mild uncovertebral spurring.   There is a suggestion of a hyperintense focus in the right spinal cord on an echo axial images only not seen on other sequences or planes.  This is more likely to be artifactual.. C5 - C6:  There is a right disc osteophyte complex that distorts the right side of the thecal sac without causing spinal cord compression. There is mild right foraminal narrowing but no nerve root compression. C6 - C7:  The disc and interspace appear normal. C7 - T1:  The disc and interspace appear normal.    This MRI of the cervical spine without contrast shows the following: 1.   At C3-C4, there are predominantly left-sided degenerative changes causing moderate foraminal narrowing. Although there does not appear to be nerve root compression there is some encroachment upon the exiting left C4 nerve root. 2.   At C4-C5 there are degenerative changes nerve root impingement. On the axial gradient echo MEDIC images (image #11) there is a suggestion of hyperintense signal within the right side of the spinal cord but this is not seen on any of the other sequences and is thus more likely to be artifactual than due to myelopathy. 3.   At C5-C6 there is a right disc osteophyte complex distorting the right side of the thecal sac without causing spinal cord compression or nerve root compression. INTERPRETING PHYSICIAN: Richard A. Sater, MD, PhD Certified in  Neuroimaging by [employer] [facility] [employer]    82 yo female with PMH of PAF (on amiodarone and eliquis), HTN, IBS, diastolic CHF, CKD IV who presented to the ED with reports of  increased dyspnea, headache and diarrhea for about 2 weeks.   1. LLL CAP: Treatment per primary  2. Elevated trop: Did have mild elevation in POC trop on admission with downward trend. 0.13>>0.09. Denies having had any episodes of chest pain prior or during this admission. At this time not likely to be ACS. 2/2 demand ischemia from CAP.   2D echo with normal LVF and EKG  with nonspecific ST abnormality similar to prior EKGs. No further ischemic workup at this time.  Could consider as an outpt.  Please have patient followup with Dr. Irish Lack as outp.   3. AKI on CKD IV: Cr with improvement this morning. Follow BMET  4. HTN:  controlled.  -- continue norvasc  5. PAF:  SR on telemetry -- Continue amiodarone, Eliquis -- This patients CHA2DS2-VASc Score and unadjusted Ischemic Stroke Rate (% per year) is equal to 4.8 % stroke rate/year from a score of 4 Above score calculated as 1 point each if present [CHF, HTN, DM, Vascular=MI/PAD/Aortic Plaque, Age if 65-74, or Female] Above score calculated as 2 points each if present [Age > 75, or Stroke/TIA/TE]  6. Diastolic CHF: Does not appear volume overloaded. Crackles at right base have resolved.  She still has crackles at left base c/w LLL PNA. Last echo 01/2014 with showed normal EF. Repeat echo with normal LVF EF 55-60% and moderate LVH, mild AS, mild MR  No new recs at this time. Will sign off.  Call with any questions.       Fransico Him, MD  12/15/2015  10:22 AM

## 2015-12-15 NOTE — Progress Notes (Signed)
SATURATION QUALIFICATIONS: (This note is used to comply with regulatory documentation for home oxygen) 91 Patient Saturations on Room Air at Rest = 91%  Patient Saturations on Room Air while Ambulating = 96%  Patient Saturations on 2 Liters of oxygen while Ambulating =96%  Please briefly explain why patient needs home oxygen:

## 2015-12-15 NOTE — Care Management Note (Signed)
Case Management Note  Patient Details  Name: Heather Benson MRN: [id number] Date of Birth: [birth year]  Subjective/Objective:      Admitted with Pneumonia              Action/Plan: Patient lives at home with spouse; PCP is Dr [name]; Private insurance with Medicare; [facility] choice [facility]; patient could benefit from HHPT/ RN at discharge. HHC choice offered, pt/ spouse chose [place] ( [facility]) [facility] with [facility] called for arrangements; 3:1 ordered also as requested.  Expected Discharge Date:    possibly 12/16/2015             Expected Discharge Plan:  [place]  In-House Referral:   [facility]  Discharge planning Services  CM Consult Choice offered to:  Patient, Spouse  DME Arranged:   3:1 DME Agency:   [facility]  HH Arranged:  RN, PT [place] Agency:  [facility] ([facility])  Status of Service:  In process, will continue to follow  Sherrilyn Rist [id number] 12/15/2015, 1:59 PM

## 2015-12-15 NOTE — Evaluation (Signed)
Physical Therapy Evaluation Patient Details Name: Heather Benson MRN: [id number] DOB: [birth year] Today's Date: 12/15/2015   History of Present Illness  Heather Benson is a 82 y.o. female with a past medical history significant for atrial fibrillation on amiodarone and apixaban, HTN, CKD stage IV who presents with cough for 1 week with fever, purulent sputum and dyspnea.  Clinical Impression  Patient presents with decreased independence with mobility due to deficits listed in PT problem list.  She will benefit from skilled PT in the acute setting to allow d/c home with family support and follow up HHPT.     Follow Up Recommendations Home health PT;Supervision/Assistance - 24 hour    Equipment Recommendations  3in1 (PT)    Recommendations for Other Services       Precautions / Restrictions Precautions Precautions: Fall      Mobility  Bed Mobility               General bed mobility comments: sitting up in recliner  Transfers Overall transfer level: Needs assistance Equipment used: Rolling walker (2 wheeled) Transfers: Sit to/from [employer] Transfers Sit to Stand: Min assist;Mod assist Stand pivot transfers: Min assist       General transfer comment: spouse assist up from recliner, assist up from 3:1 with UE support on armrests; assist to pivot with RW to 3:1 from recliner  Ambulation/Gait Ambulation/Gait assistance: Min assist Ambulation Distance (Feet): 25 Feet Assistive device: Rolling walker (2 wheeled) Gait Pattern/deviations: Step-through pattern;Decreased stride length;Shuffle     General Gait Details: assist for balance, safety; pt c/o lightheaded throughout; BP checked after return to recliner and noted to be low (108/49)  Stairs            Wheelchair Mobility    Modified Rankin (Stroke Patients Only)       Balance Overall balance assessment: Needs assistance   Sitting balance-Leahy Scale: Fair     Standing balance support:  Bilateral upper extremity supported Standing balance-Leahy Scale: Poor Standing balance comment: needs assist or UE support for balance                             Pertinent Vitals/Pain Pain Assessment: Faces Faces Pain Scale: Hurts little more Pain Location: c/o headache Pain Descriptors / Indicators: Headache Pain Intervention(s): Monitored during session    Home Living Family/patient expects to be discharged to:: Private residence Living Arrangements: Spouse/significant other Available Help at Discharge: Family;Available 24 hours/day Type of Home: House Home Access: Stairs to enter   [employer] of Steps: 1 Home Layout: One level (with sunken sunroom 1 step to enter) Home Equipment: Walker - 2 wheels;Cane - single point      Prior Function Level of Independence: Needs assistance   Gait / Transfers Assistance Needed: spouse helping at times with transfers, patient able to walk with cane to bathroom on her own           Hand Dominance        Extremity/Trunk Assessment   Upper Extremity Assessment: Generalized weakness           Lower Extremity Assessment: Generalized weakness         Communication   Communication: No difficulties  Cognition Arousal/Alertness: Awake/alert Behavior During Therapy: WFL for tasks assessed/performed Overall Cognitive Status: Within Functional Limits for tasks assessed                      General  Comments General comments (skin integrity, edema, etc.): spouse and sons in room and report pt with limited mobility during hosptial stay.  Report pt. with declining mobility prior to admission    Exercises        Assessment/Plan    PT Assessment Patient needs continued PT services  PT Diagnosis Generalized weakness;Difficulty walking   PT Problem List Decreased strength;Decreased activity tolerance;Decreased balance;Decreased mobility;Cardiopulmonary status limiting activity  PT Treatment  Interventions DME instruction;Gait training;Stair training;Functional mobility training;Balance training;Therapeutic activities;Therapeutic exercise;Patient/family education   PT Goals (Current goals can be found in the Care Plan section) Acute Rehab PT Goals Patient Stated Goal: To go home today PT Goal Formulation: With patient/family Time For Goal Achievement: 12/22/15 Potential to Achieve Goals: Fair    Frequency Min 3X/week   Barriers to discharge        Co-evaluation               End of Session Equipment Utilized During Treatment: Gait belt Activity Tolerance: Patient limited by fatigue;Treatment limited secondary to medical complications (Comment) (low BP) Patient left: with call bell/phone within reach;in chair;with family/visitor present           Time: [id number] PT Time Calculation (min) (ACUTE ONLY): 26 min   Charges:   PT Evaluation $PT Eval Moderate Complexity: 1 Procedure PT Treatments $Gait Training: 8-22 mins   PT G CodesReginia Naas January 07, 2016, 1:14 PM  Magda Kiel, [place] 01-07-16

## 2015-12-15 NOTE — Progress Notes (Signed)
All d/c instructions explained and given to pt and her spouse at the bedside.  Verbalized understanding.  At [place] pt d/c off floor via w/ to awaiting transport/  Karie Kirks, RN.

## 2015-12-18 LAB — CULTURE, BLOOD (ROUTINE X 2)
CULTURE: NO GROWTH
CULTURE: NO GROWTH

## 2015-12-19 DIAGNOSIS — E441 Mild protein-calorie malnutrition: Secondary | ICD-10-CM | POA: Diagnosis not present

## 2015-12-19 DIAGNOSIS — R197 Diarrhea, unspecified: Secondary | ICD-10-CM | POA: Diagnosis not present

## 2015-12-19 DIAGNOSIS — N179 Acute kidney failure, unspecified: Secondary | ICD-10-CM | POA: Diagnosis not present

## 2015-12-19 DIAGNOSIS — J181 Lobar pneumonia, unspecified organism: Secondary | ICD-10-CM | POA: Diagnosis not present

## 2015-12-19 DIAGNOSIS — H6122 Impacted cerumen, left ear: Secondary | ICD-10-CM | POA: Diagnosis not present

## 2015-12-20 DIAGNOSIS — J189 Pneumonia, unspecified organism: Secondary | ICD-10-CM | POA: Diagnosis not present

## 2015-12-20 DIAGNOSIS — I480 Paroxysmal atrial fibrillation: Secondary | ICD-10-CM | POA: Diagnosis not present

## 2015-12-20 DIAGNOSIS — I130 Hypertensive heart and chronic kidney disease with heart failure and stage 1 through stage 4 chronic kidney disease, or unspecified chronic kidney disease: Secondary | ICD-10-CM | POA: Diagnosis not present

## 2015-12-20 DIAGNOSIS — I5030 Unspecified diastolic (congestive) heart failure: Secondary | ICD-10-CM | POA: Diagnosis not present

## 2015-12-20 DIAGNOSIS — D631 Anemia in chronic kidney disease: Secondary | ICD-10-CM | POA: Diagnosis not present

## 2015-12-20 DIAGNOSIS — N184 Chronic kidney disease, stage 4 (severe): Secondary | ICD-10-CM | POA: Diagnosis not present

## 2015-12-25 ENCOUNTER — Ambulatory Visit (INDEPENDENT_AMBULATORY_CARE_PROVIDER_SITE_OTHER): Payer: Medicare Other | Admitting: Diagnostic Neuroimaging

## 2015-12-25 ENCOUNTER — Encounter: Payer: Self-pay | Admitting: Diagnostic Neuroimaging

## 2015-12-25 VITALS — BP 124/70 | HR 80 | Wt 211.8 lb

## 2015-12-25 DIAGNOSIS — I951 Orthostatic hypotension: Secondary | ICD-10-CM | POA: Diagnosis not present

## 2015-12-25 DIAGNOSIS — G20 Parkinson's disease: Secondary | ICD-10-CM

## 2015-12-25 NOTE — Progress Notes (Signed)
[facility]  PATIENT: Heather Benson DOB: [birth year]  REFERRING CLINICIAN: C White  HISTORY FROM: patient  REASON FOR VISIT: new consult    HISTORICAL  CHIEF COMPLAINT:  Chief Complaint  Patient presents with  . Other    rm 6, husband [name], orthostatic hypotension, "episodes of dizziness, use a cane, had PT evaluation"    HISTORY OF PRESENT ILLNESS:   UPDATE 12/25/15: Since last visit patient is doing slightly better. Her episodes of dizziness have reduced, but continue. Now she is using a cane and has had physical therapy evaluation.  PRIOR HPI (11/23/15 ) 82 year old right-handed female here for evaluation of dizziness. Since 2012 patient has had intermittent episodes of lightheadedness, fear of passing out, almost blacking out sensations especially when she stands up from sitting position or if she is standing up for a long time. This has happened several times when she is at church. She typically grabs onto something, studies herself, and then avoid passing out. Episodes are happening frequently almost once per day. Patient was evaluated by neurology and cardiology in the past for this problem, found to have orthostatic hypotension, advised to increase fluid intake, adjust blood pressure medication and gradually move from sitting to standing position. In spite of these interventions patient has continued to have problems. Patient has another type of "dizziness" problem which she describes as a balance problem. Patient describes at least one year of progressive unsteadiness with balance and walking. She has not fallen down. She typically uses a cane if she has to walk a long distance. Otherwise she tends to hold onto furniture and people to balance herself. She denies any room spinning or vertigo, nausea or vomiting with these attacks. Patient has noticed some intermittent tremor in her arms. Patient denies any change in smell, taste, sleep, anxiety or constipation.  No unilateral numbness, tingling, weakness, slurred speech or trouble talking. She has had a chronic cough for the past one year. People tell her that she tends to speak quietly.   REVIEW OF SYSTEMS: Full 14 system review of systems performed and negative with exception of:  dizziness cough.   ALLERGIES: Allergies  Allergen Reactions  . Hydromorphone Nausea And Vomiting  . Codeine Other (See Comments)    "Spaced out feeling"  . Augmentin [Amoxicillin-Pot Clavulanate] Diarrhea and Nausea Only  . Boniva [Ibandronic Acid] Diarrhea  . Latex Other (See Comments)    Unknown  . Lisinopril Cough  . Tape Itching and Rash    HOME MEDICATIONS: Outpatient Medications Prior to Visit  Medication Sig Dispense Refill  . acetaminophen (TYLENOL) 500 MG tablet Take 1,000 mg by mouth every 8 (eight) hours as needed for moderate pain (pain in legs).    [name] amiodarone (PACERONE) 200 MG tablet TAKE 1 TABLET (200 MG TOTAL) BY MOUTH DAILY. 90 tablet 3  . amLODipine (NORVASC) 2.5 MG tablet Take 1 tablet (2.5 mg total) by mouth daily. 90 tablet 0  . azithromycin (ZITHROMAX) 500 MG tablet Take 1 tablet (500 mg total) by mouth daily. 7 tablet 0  . Cholecalciferol (VITAMIN D3) 2000 UNITS capsule Take 2,000 Units by mouth daily.    [name] ELIQUIS 2.5 MG TABS tablet TAKE 1 TABLET (2.5 MG TOTAL) BY MOUTH 2 (TWO) TIMES DAILY. 180 tablet 1  . fexofenadine (ALLEGRA) 180 MG tablet Take 180 mg by mouth daily.    . fluticasone (FLONASE) 50 MCG/ACT nasal spray Place 1 spray into both nostrils daily as needed for allergies.     [name]  furosemide (LASIX) 40 MG tablet Take 20 mg by mouth daily.    [name] latanoprost (XALATAN) 0.005 % ophthalmic solution Place 1 drop into both eyes at bedtime.  3  . meclizine (ANTIVERT) 25 MG tablet Take 12.5 mg by mouth every other day.   0  . OVER THE COUNTER MEDICATION Take 1 tablet by mouth daily. Move Free Ultra    . pantoprazole (PROTONIX) 40 MG tablet Take 40 mg by mouth daily.    [name] saccharomyces  boulardii (FLORASTOR) 250 MG capsule Take 1 capsule (250 mg total) by mouth 2 (two) times daily. 20 capsule 0  . tetrahydrozoline 0.05 % ophthalmic solution Place 1 drop into both eyes 2 (two) times daily as needed (for allergy eyes).      No facility-administered medications prior to visit.     PAST MEDICAL HISTORY: Past Medical History:  Diagnosis Date  . Aortic stenosis, mild 12/14/2015  . Arthritis 08-28-11   right knee pain-surgery planned  . Cancer [facility]) 08-28-11   Melonoma-cheek(many Yrs ago) , recent bx. left  face lesion, past skin cancer lesions  . CAP (community acquired pneumonia) 12/13/2015  . CHF (congestive heart failure) ([place])   . Complication of anesthesia 08-28-11   in past arrythmia-  cardiology has seen in past  . Dysrhythmia 08-28-11   only immed.  to several days after surgery- hx. A.fib. in past  . GERD (gastroesophageal reflux disease) 08-28-11   tx. omeprazole  . Hypertension 08-28-11   tx. meds  . IBS (irritable bowel syndrome)   . Neuromuscular disorder ([place]) 08-28-11   hx. Polio age 39-slight residual affects legs  . Pneumonia 12/2015   hospital x 3 days  . PONV (postoperative nausea and vomiting)   . Swelling of extremity 08-28-11   bilateral lower extremities- mid calf down    PAST SURGICAL HISTORY: Past Surgical History:  Procedure Laterality Date  . ABDOMINAL HYSTERECTOMY  08-28-11   Partial-vaginal approach  . BUNIONECTOMY  08-28-11   right  . CARDIOVERSION N/A 08/05/2014   Procedure: CARDIOVERSION;  Surgeon: Larey Dresser, MD;  Location: [facility];  Service: Cardiovascular;  Laterality: N/A;  . CATARACT EXTRACTION, BILATERAL  08-28-11   bilateral  . EYE SURGERY     Cataract  . JOINT REPLACEMENT  Oct. 1, 2012   Right Knee  . KNEE ARTHROSCOPY  09/06/2011   Procedure: ARTHROSCOPY KNEE;  Surgeon: Gearlean Alf, MD;  Location: [facility];  Service: Orthopedics;  Laterality: Right;  with Synovectomy    FAMILY HISTORY: Family History  Problem  Relation Age of Onset  . Cancer Mother   . Heart disease Mother     Varicose Veins  . Hyperlipidemia Mother   . Hypertension Mother   . Heart disease Father     Heart Disease before age 61  . Hypertension Father   . Cancer Sister     Breast  . Stroke Maternal Grandmother   . Heart attack Brother   . Cancer Sister     breast    SOCIAL HISTORY:  Social History   Social History  . Marital status: Married    Spouse name: [name]  . Number of children: 3  . Years of education: 12   Occupational History  . Retired- [employer] [place] History Main Topics  . Smoking status: Never Smoker  . Smokeless tobacco: Never Used  . Alcohol use No  . Drug use: No  . Sexual activity: Not Currently  Partners: Male   Other Topics Concern  . Not on file   Social History Narrative   Lives with husband, Hilbert Odor at home   Caffeine use: Drinks coffee/tea/caffeine free soda     PHYSICAL EXAM   GENERAL EXAM/CONSTITUTIONAL: Vitals:  Vitals:   12/25/15 1426  BP: 124/70  Pulse: 80  Weight: 211 lb 12.8 oz (96.1 kg)    No data found.  Body mass index is 34.19 kg/m. No exam data present  Patient is in no distress; well developed, nourished and groomed;   NECK HAS DECR ROM  MASKED FACIES  SOFT VOICE  CARDIOVASCULAR:  Examination of carotid arteries is normal; no carotid bruits  Regular rate and rhythm, no murmurs  Examination of peripheral vascular system by observation and palpation is normal  EYES:  Ophthalmoscopic exam of optic discs and posterior segments is normal; no papilledema or hemorrhages  MUSCULOSKELETAL:  Gait, strength, tone, movements noted in Neurologic exam below  NEUROLOGIC: MENTAL STATUS:  No flowsheet data found.  awake, alert, oriented to person, place and time  DECR recent and remote memory intact  normal attention and concentration  language fluent, comprehension intact, naming intact,   fund of knowledge  appropriate  CRANIAL NERVE:   2nd - no papilledema on fundoscopic exam  2nd, 3rd, 4th, 6th - pupils equal and reactive to light, visual fields full to confrontation, extraocular muscles intact, no nystagmus  5th - facial sensation symmetric  7th - facial strength symmetric  8th - hearing intact  9th - palate elevates symmetrically, uvula midline  11th - shoulder shrug symmetric  12th - tongue protrusion midline  MOTOR:   POSTURAL AND ACTION TREMOR IN BUE (LEFT WORSE THAN RIGHT)  RARE RESTING TREMOR IN LUE WITH CONTRALATERAL FINGER TAPPING  BRADYKINESIA IN BUE AND FOOT TAPPING  normal bulk, full strength in the BUE, BLE; EXCEPT HIP FLEX 4+ BILATERALLY  SENSORY:   normal and symmetric to light touch; ABSENT VIB AT TOES  COORDINATION:   finger-nose-finger, fine finger movements SLOW BILATERALLY  REFLEXES:   deep tendon reflexes RUE 2, LUE 3; KNEES 3; ANKLES TRACE  POSITIVE SNOUT AND MYERSON'S REFLEXES  GAIT/STATION:   SLOW TO RISE; SHORT STEPS; UNSTEADY    DIAGNOSTIC DATA (LABS, IMAGING, TESTING) - I reviewed patient records, labs, notes, testing and imaging myself where available.  Lab Results  Component Value Date   WBC 6.7 12/15/2015   HGB 10.6 (L) 12/15/2015   HCT 35.3 (L) 12/15/2015   MCV 82.9 12/15/2015   PLT 265 12/15/2015      Component Value Date/Time   NA 135 12/15/2015 0430   K 3.9 12/15/2015 0430   CL 96 (L) 12/15/2015 0430   CO2 31 12/15/2015 0430   GLUCOSE 108 (H) 12/15/2015 0430   BUN 25 (H) 12/15/2015 0430   CREATININE 1.92 (H) 12/15/2015 0430   CREATININE 2.03 (H) 02/27/2015 1051   CALCIUM 8.7 (L) 12/15/2015 0430   PROT 6.0 (L) 12/15/2015 0430   PROT 7.2 11/13/2015 1305   ALBUMIN 2.9 (L) 12/15/2015 0430   AST 37 12/15/2015 0430   ALT 22 12/15/2015 0430   ALKPHOS 54 12/15/2015 0430   BILITOT 0.3 12/15/2015 0430   GFRNONAA 23 (L) 12/15/2015 0430   GFRAA 27 (L) 12/15/2015 0430   Lab Results  Component Value Date   CHOL   05/27/2010    168        ATP III CLASSIFICATION:  <200     mg/dL   Desirable  200-239  mg/dL   Borderline High  >=240    mg/dL   High          HDL 51 05/27/2010   LDLCALC  05/27/2010    98        Total Cholesterol/HDL:CHD Risk Coronary Heart Disease Risk Table                     Men   Women  1/2 Average Risk   3.4   3.3  Average Risk       5.0   4.4  2 X Average Risk   9.6   7.1  3 X Average Risk  23.4   11.0        Use the calculated Patient Ratio above and the CHD Risk Table to determine the patient's CHD Risk.        ATP III CLASSIFICATION (LDL):  <100     mg/dL   Optimal  100-129  mg/dL   Near or Above                    Optimal  130-159  mg/dL   Borderline  160-189  mg/dL   High  >190     mg/dL   Very High   TRIG 95 05/27/2010   CHOLHDL 3.3 05/27/2010   Lab Results  Component Value Date   HGBA1C (H) 05/26/2010    5.8 (NOTE)                                                                       According to the ADA Clinical Practice Recommendations for 2011, when HbA1c is used as a screening test:   >=6.5%   Diagnostic of Diabetes Mellitus           (if abnormal result  is confirmed)  5.7-6.4%   Increased risk of developing Diabetes Mellitus  References:Diagnosis and Classification of Diabetes Mellitus,Diabetes [id number] 1):S62-S69 and Standards of Medical Care in         Diabetes - 2011,Diabetes Care,2011,34  (Suppl 1):S11-S61.   Lab Results  Component Value Date   [id number] 11/13/2015   Lab Results  Component Value Date   TSH 1.400 11/13/2015    02/04/14 TTE  - Low normal left ventricular systolic and mildly reduced rightventricular function. This might be underestimated by rapidventricular rate.  12/03/13 CT chest [I reviewed images myself and agree with interpretation. -VRP]  1. Mild bibasilar atelectasis noted, more prominent on the right. Minimal hazy opacity at the anterior right upper lobe and inferior aspect of the left upper lobe likely  also reflects atelectasis. Infection is thought to be less likely. 2. Dense calcification at the mitral valve. 3. Scattered coronary artery calcifications seen. 4. Apparent mild prominence of the pulmonary arteries, which could reflect mild pulmonary arterial hypertension. 5. Likely small bilateral renal cysts seen. 6. Tiny hiatal hernia noted. 7. Stable chronic loss of height at vertebral body T12.  11/23/15 MRI brain 1.    Moderate cortical atrophy that is a little more pronounced in the mesial temporal lobes. 2.    Mild chronic microvascular ischemic change, predominantly in the periventricular white matter and with a small focus in the left cerebellum.  3.    There are no acute findings.   11/23/15 MRI cervical  1.   At C3-C4, there are predominantly left-sided degenerative changes causing moderate foraminal narrowing. Although there does not appear to be nerve root compression there is some encroachment upon the exiting left C4 nerve root. 2.   At C4-C5 there are degenerative changes nerve root impingement. On the axial gradient echo MEDIC images (image #11) there is a suggestion of hyperintense signal within the right side of the spinal cord but this is not seen on any of the other sequences and is thus more likely to be artifactual than due to myelopathy.  3.   At C5-C6 there is a right disc osteophyte complex distorting the right side of the thecal sac without causing spinal cord compression or nerve root compression.     ASSESSMENT AND PLAN  82 y.o. year old female here with 2 types of dizziness sensations. The first type consistent orthostatic lightheadedness associated with orthostatic hypotension. The second type consistent of balance and walking problem. Neurologic examination notable for orthostatic hypotension on exam today (30 point drop in systolic pressure with moving from laying to standing, with inadequate compensatory tachycardia). Also patient has subtle resting tremor,  bradykinesia, postural instability. Findings suspicious for parkinsonism (multiple system atrophy versus idiopathic Parkinson's disease). Patient also has subtle hyperreflexia in the left upper and bilateral lower extremities, associated with neck pain, raising possibility of cervical myelopathy. We'll check further evaluation for causes of neurogenic orthostatic hypotension, autonomic neuropathy, cervical myelopathy or other CNS causes.  Ddx: autonomic neuropathy, neurogenic orthostatic hypotension, parkinsonism (MSA-a vs parkinson's disease)  1. Orthostatic hypotension   2. Parkinsonism, unspecified Parkinsonism type ([place])     PLAN: - continue PT evaluation / exercises - use cane - caution with gait, walking, driving - in future may consider droxidopa (northera) for neurogenic orthostatic hypotension  Return in about 3 months (around 03/26/2016).     Penni Bombard, MD [id number], [id number] PM Certified in Neurology, Neurophysiology and Neuroimaging  [facility] [address] [place], [place] [address] [phone]

## 2015-12-25 NOTE — Patient Instructions (Signed)
-   monitor symptoms

## 2015-12-26 ENCOUNTER — Other Ambulatory Visit: Payer: Self-pay | Admitting: Family Medicine

## 2015-12-26 ENCOUNTER — Ambulatory Visit
Admission: RE | Admit: 2015-12-26 | Discharge: 2015-12-26 | Disposition: A | Discharge status: Home / Self Care | Payer: Medicare Other | Source: Ambulatory Visit | Attending: Family Medicine | Admitting: Family Medicine

## 2015-12-26 ENCOUNTER — Encounter: Payer: Self-pay | Admitting: Diagnostic Neuroimaging

## 2015-12-26 DIAGNOSIS — J181 Lobar pneumonia, unspecified organism: Secondary | ICD-10-CM

## 2015-12-26 DIAGNOSIS — J189 Pneumonia, unspecified organism: Secondary | ICD-10-CM | POA: Diagnosis not present

## 2015-12-28 DIAGNOSIS — D631 Anemia in chronic kidney disease: Secondary | ICD-10-CM | POA: Diagnosis not present

## 2015-12-28 DIAGNOSIS — J189 Pneumonia, unspecified organism: Secondary | ICD-10-CM | POA: Diagnosis not present

## 2015-12-28 DIAGNOSIS — I5030 Unspecified diastolic (congestive) heart failure: Secondary | ICD-10-CM | POA: Diagnosis not present

## 2015-12-28 DIAGNOSIS — R197 Diarrhea, unspecified: Secondary | ICD-10-CM | POA: Diagnosis not present

## 2015-12-28 DIAGNOSIS — I480 Paroxysmal atrial fibrillation: Secondary | ICD-10-CM | POA: Diagnosis not present

## 2015-12-28 DIAGNOSIS — R630 Anorexia: Secondary | ICD-10-CM | POA: Diagnosis not present

## 2015-12-28 DIAGNOSIS — I130 Hypertensive heart and chronic kidney disease with heart failure and stage 1 through stage 4 chronic kidney disease, or unspecified chronic kidney disease: Secondary | ICD-10-CM | POA: Diagnosis not present

## 2015-12-28 DIAGNOSIS — N184 Chronic kidney disease, stage 4 (severe): Secondary | ICD-10-CM | POA: Diagnosis not present

## 2016-01-01 ENCOUNTER — Other Ambulatory Visit: Payer: Self-pay | Admitting: Interventional Cardiology

## 2016-01-01 MED ORDER — APIXABAN 2.5 MG PO TABS: ORAL_TABLET | ORAL | 2 refills | Status: DC

## 2016-01-02 DIAGNOSIS — N184 Chronic kidney disease, stage 4 (severe): Secondary | ICD-10-CM | POA: Diagnosis not present

## 2016-01-02 DIAGNOSIS — I5030 Unspecified diastolic (congestive) heart failure: Secondary | ICD-10-CM | POA: Diagnosis not present

## 2016-01-02 DIAGNOSIS — D631 Anemia in chronic kidney disease: Secondary | ICD-10-CM | POA: Diagnosis not present

## 2016-01-02 DIAGNOSIS — J189 Pneumonia, unspecified organism: Secondary | ICD-10-CM | POA: Diagnosis not present

## 2016-01-02 DIAGNOSIS — I130 Hypertensive heart and chronic kidney disease with heart failure and stage 1 through stage 4 chronic kidney disease, or unspecified chronic kidney disease: Secondary | ICD-10-CM | POA: Diagnosis not present

## 2016-01-02 DIAGNOSIS — I480 Paroxysmal atrial fibrillation: Secondary | ICD-10-CM | POA: Diagnosis not present

## 2016-01-05 ENCOUNTER — Telehealth: Payer: Self-pay | Admitting: Interventional Cardiology

## 2016-01-05 DIAGNOSIS — J189 Pneumonia, unspecified organism: Secondary | ICD-10-CM | POA: Diagnosis not present

## 2016-01-05 DIAGNOSIS — N184 Chronic kidney disease, stage 4 (severe): Secondary | ICD-10-CM | POA: Diagnosis not present

## 2016-01-05 DIAGNOSIS — I480 Paroxysmal atrial fibrillation: Secondary | ICD-10-CM | POA: Diagnosis not present

## 2016-01-05 DIAGNOSIS — I130 Hypertensive heart and chronic kidney disease with heart failure and stage 1 through stage 4 chronic kidney disease, or unspecified chronic kidney disease: Secondary | ICD-10-CM | POA: Diagnosis not present

## 2016-01-05 DIAGNOSIS — I5030 Unspecified diastolic (congestive) heart failure: Secondary | ICD-10-CM | POA: Diagnosis not present

## 2016-01-05 DIAGNOSIS — D631 Anemia in chronic kidney disease: Secondary | ICD-10-CM | POA: Diagnosis not present

## 2016-01-05 MED ORDER — APIXABAN 2.5 MG PO TABS: ORAL_TABLET | ORAL | 2 refills | Status: DC

## 2016-01-05 NOTE — Telephone Encounter (Signed)
Blood pressures controlled.  COntinue to monitor.  I fit gets worse, would consider repeating event monitor.

## 2016-01-05 NOTE — Telephone Encounter (Signed)
New Message:     Please call asap,she is at the pt's home. Not an emergency,but she definitely needs to talk to you while she is with the pt.

## 2016-01-05 NOTE — Telephone Encounter (Signed)
Spoke with home care nurse. 1.) pt took last dose of Eliquis this am.  Waiting on mail to deliver the prescription.  Request a prescription sent to [facility] to be picked up later today by her husband.   Sent refill to [facility].  2.) pt c/o dizziness that started over last weekend.  Per nurse, pt states she is getting enough to drink, even though not eating much.  She can get dizzy sitting or standing.  BP sitting 124/82, standing 108/78.   Takes hydrocodone cough syrup q hs.   Has meclizine at home (on med list), took one 3 days ago but it didn't help and "it never helps".    Advised I would send message to Dr. Irish Lack to inform and would call patient back with recommendations.

## 2016-01-08 NOTE — Telephone Encounter (Signed)
Called patient and informed to continue to monitor dizziness and if continues to let Dr. Irish Lack know.  She is appreciative for the call.

## 2016-01-10 ENCOUNTER — Institutional Professional Consult (permissible substitution): Payer: Medicare Other | Admitting: Pulmonary Disease

## 2016-01-11 DIAGNOSIS — D631 Anemia in chronic kidney disease: Secondary | ICD-10-CM | POA: Diagnosis not present

## 2016-01-11 DIAGNOSIS — N184 Chronic kidney disease, stage 4 (severe): Secondary | ICD-10-CM | POA: Diagnosis not present

## 2016-01-11 DIAGNOSIS — I480 Paroxysmal atrial fibrillation: Secondary | ICD-10-CM | POA: Diagnosis not present

## 2016-01-11 DIAGNOSIS — J189 Pneumonia, unspecified organism: Secondary | ICD-10-CM | POA: Diagnosis not present

## 2016-01-11 DIAGNOSIS — I130 Hypertensive heart and chronic kidney disease with heart failure and stage 1 through stage 4 chronic kidney disease, or unspecified chronic kidney disease: Secondary | ICD-10-CM | POA: Diagnosis not present

## 2016-01-11 DIAGNOSIS — I5030 Unspecified diastolic (congestive) heart failure: Secondary | ICD-10-CM | POA: Diagnosis not present

## 2016-01-18 DIAGNOSIS — I5030 Unspecified diastolic (congestive) heart failure: Secondary | ICD-10-CM | POA: Diagnosis not present

## 2016-01-18 DIAGNOSIS — I130 Hypertensive heart and chronic kidney disease with heart failure and stage 1 through stage 4 chronic kidney disease, or unspecified chronic kidney disease: Secondary | ICD-10-CM | POA: Diagnosis not present

## 2016-01-18 DIAGNOSIS — J189 Pneumonia, unspecified organism: Secondary | ICD-10-CM | POA: Diagnosis not present

## 2016-01-18 DIAGNOSIS — I480 Paroxysmal atrial fibrillation: Secondary | ICD-10-CM | POA: Diagnosis not present

## 2016-01-18 DIAGNOSIS — D631 Anemia in chronic kidney disease: Secondary | ICD-10-CM | POA: Diagnosis not present

## 2016-01-18 DIAGNOSIS — N184 Chronic kidney disease, stage 4 (severe): Secondary | ICD-10-CM | POA: Diagnosis not present

## 2016-01-25 DIAGNOSIS — N184 Chronic kidney disease, stage 4 (severe): Secondary | ICD-10-CM | POA: Diagnosis not present

## 2016-01-25 DIAGNOSIS — D631 Anemia in chronic kidney disease: Secondary | ICD-10-CM | POA: Diagnosis not present

## 2016-01-25 DIAGNOSIS — I5030 Unspecified diastolic (congestive) heart failure: Secondary | ICD-10-CM | POA: Diagnosis not present

## 2016-01-25 DIAGNOSIS — I480 Paroxysmal atrial fibrillation: Secondary | ICD-10-CM | POA: Diagnosis not present

## 2016-01-25 DIAGNOSIS — I130 Hypertensive heart and chronic kidney disease with heart failure and stage 1 through stage 4 chronic kidney disease, or unspecified chronic kidney disease: Secondary | ICD-10-CM | POA: Diagnosis not present

## 2016-01-25 DIAGNOSIS — J189 Pneumonia, unspecified organism: Secondary | ICD-10-CM | POA: Diagnosis not present

## 2016-01-29 DIAGNOSIS — N184 Chronic kidney disease, stage 4 (severe): Secondary | ICD-10-CM | POA: Diagnosis not present

## 2016-01-29 DIAGNOSIS — Z23 Encounter for immunization: Secondary | ICD-10-CM | POA: Diagnosis not present

## 2016-01-29 DIAGNOSIS — Z1389 Encounter for screening for other disorder: Secondary | ICD-10-CM | POA: Diagnosis not present

## 2016-01-29 DIAGNOSIS — R05 Cough: Secondary | ICD-10-CM | POA: Diagnosis not present

## 2016-01-29 DIAGNOSIS — I480 Paroxysmal atrial fibrillation: Secondary | ICD-10-CM | POA: Diagnosis not present

## 2016-01-29 DIAGNOSIS — E6609 Other obesity due to excess calories: Secondary | ICD-10-CM | POA: Diagnosis not present

## 2016-01-29 DIAGNOSIS — I129 Hypertensive chronic kidney disease with stage 1 through stage 4 chronic kidney disease, or unspecified chronic kidney disease: Secondary | ICD-10-CM | POA: Diagnosis not present

## 2016-02-06 ENCOUNTER — Ambulatory Visit (INDEPENDENT_AMBULATORY_CARE_PROVIDER_SITE_OTHER): Payer: Medicare Other | Admitting: Interventional Cardiology

## 2016-02-06 ENCOUNTER — Telehealth: Payer: Self-pay | Admitting: Pulmonary Disease

## 2016-02-06 ENCOUNTER — Encounter: Payer: Self-pay | Admitting: Pulmonary Disease

## 2016-02-06 ENCOUNTER — Ambulatory Visit (INDEPENDENT_AMBULATORY_CARE_PROVIDER_SITE_OTHER): Payer: Medicare Other | Admitting: Pulmonary Disease

## 2016-02-06 ENCOUNTER — Encounter: Payer: Self-pay | Admitting: Interventional Cardiology

## 2016-02-06 VITALS — BP 120/70 | HR 62 | Ht 66.0 in | Wt 209.0 lb

## 2016-02-06 VITALS — BP 140/84 | HR 67 | Ht 66.0 in | Wt 209.6 lb

## 2016-02-06 DIAGNOSIS — R05 Cough: Secondary | ICD-10-CM

## 2016-02-06 DIAGNOSIS — R600 Localized edema: Secondary | ICD-10-CM | POA: Diagnosis not present

## 2016-02-06 DIAGNOSIS — I10 Essential (primary) hypertension: Secondary | ICD-10-CM | POA: Diagnosis not present

## 2016-02-06 DIAGNOSIS — R0609 Other forms of dyspnea: Secondary | ICD-10-CM | POA: Diagnosis not present

## 2016-02-06 DIAGNOSIS — I480 Paroxysmal atrial fibrillation: Secondary | ICD-10-CM

## 2016-02-06 DIAGNOSIS — R059 Cough, unspecified: Secondary | ICD-10-CM

## 2016-02-06 LAB — NITRIC OXIDE: Nitric Oxide: 12

## 2016-02-06 MED ORDER — AZELASTINE-FLUTICASONE 137-50 MCG/ACT NA SUSP: 1.0000 | Freq: Two times a day (BID) | NASAL | 5 refills | Status: DC

## 2016-02-06 MED ORDER — FLUTTER DEVI: 0 refills | Status: AC

## 2016-02-06 MED ORDER — AZELASTINE-FLUTICASONE 137-50 MCG/ACT NA SUSP: 1.0000 | Freq: Two times a day (BID) | NASAL | 0 refills | Status: DC

## 2016-02-06 NOTE — Telephone Encounter (Signed)
Pt wed 03/06/16@10 :30@[facility]  for pfts before appt to see dr Vaughan Browner @12 :[place]

## 2016-02-06 NOTE — Telephone Encounter (Signed)
Order has been placed for pt PFT. Will inform PCC's as pt was seen today

## 2016-02-06 NOTE — Progress Notes (Signed)
Heather Benson    [id number]    03/09/1932  Primary Care Physician:Heather S, MD  Referring Physician: [name], MD Heather Benson, Heather Benson [id number]  Chief complaint:  Evaluation for chronic cough  HPI: Heather Benson is a 82 year old with complaints of chronic cough for the past 1 year. She has daily symptoms with yellow/white mucus production, no fevers, chills. This is associated with dyspnea on exertion. She denies any dyspnea at rest. She denies any wheezing, hemoptysis. She has daily symptoms of allergic rhinitis. She denies any postnasal drip. She denies any heartburn symptoms but she is on a Protonix by mouth daily. She was admitted in August of 2017 for left lower lobe CAP, treated with azithromycin. However the symptoms of cough preceded this episode. She was on lisinopril in the past which was stopped due to cough but symptoms have persisted. She was on hycodan cough syrup in past with no effect.   She saw her primary care physician Heather Benson last week and was started on Qvar inhaler and Mucinex. She just started taking this a few days ago and has not noticed any change in symptoms yet.  Outpatient Encounter Prescriptions as of 02/06/2016  Medication Sig  . acetaminophen (TYLENOL) 500 MG tablet Take 1,000 mg by mouth every 8 (eight) hours as needed for moderate pain (pain in legs).  [name] amiodarone (PACERONE) 200 MG tablet TAKE 1 TABLET (200 MG TOTAL) BY MOUTH DAILY.  [name] amLODipine (NORVASC) 2.5 MG tablet Take 1 tablet (2.5 mg total) by mouth daily.  [name] apixaban (ELIQUIS) 2.5 MG TABS tablet TAKE 1 TABLET (2.5 MG TOTAL) BY MOUTH 2 (TWO) TIMES DAILY.  [name] azithromycin (ZITHROMAX) 500 MG tablet Take 1 tablet (500 mg total) by mouth daily.  . Cholecalciferol (VITAMIN D3) 2000 UNITS capsule Take 2,000 Units by mouth daily.  . fexofenadine (ALLEGRA) 180 MG tablet Take 180 mg by mouth daily.  . fluticasone (FLONASE) 50 MCG/ACT nasal spray Place 1  spray into both nostrils daily as needed for allergies.   . furosemide (LASIX) 40 MG tablet Take 20 mg by mouth daily.  [name] latanoprost (XALATAN) 0.005 % ophthalmic solution Place 1 drop into both eyes at bedtime.  . meclizine (ANTIVERT) 25 MG tablet Take 12.5 mg by mouth every other day.   [name] OVER THE COUNTER MEDICATION Take 1 tablet by mouth daily. Move Free Ultra  . pantoprazole (PROTONIX) 40 MG tablet Take 40 mg by mouth daily.  [name] saccharomyces boulardii (FLORASTOR) 250 MG capsule Take 1 capsule (250 mg total) by mouth 2 (two) times daily.  [name] tetrahydrozoline 0.05 % ophthalmic solution Place 1 drop into both eyes 2 (two) times daily as needed (for allergy eyes).    No facility-administered encounter medications on file as of 02/06/2016.     Allergies as of 02/06/2016 - Review Complete 12/25/2015  Allergen Reaction Noted  . Hydromorphone Nausea And Vomiting 08/05/2014  . Codeine Other (See Comments) 08/19/2011  . Augmentin [amoxicillin-pot clavulanate] Diarrhea and Nausea Only 12/13/2015  . Boniva [ibandronic acid] Diarrhea 11/09/2015  . Latex Other (See Comments) 12/13/2015  . Lisinopril Cough 11/09/2015  . Tape Itching and Rash 12/13/2015    Past Medical History:  Diagnosis Date  . Aortic stenosis, mild 12/14/2015  . Arthritis 08-28-11   right knee pain-surgery planned  . Cancer [facility]) 08-28-11   Melonoma-cheek(many Yrs ago) , recent bx. left  face lesion, past skin cancer lesions  . CAP (  community acquired pneumonia) 12/13/2015  . CHF (congestive heart failure) ([place])   . Complication of anesthesia 08-28-11   in past arrythmia-  cardiology has seen in past  . Dysrhythmia 08-28-11   only immed.  to several days after surgery- hx. A.fib. in past  . GERD (gastroesophageal reflux disease) 08-28-11   tx. omeprazole  . Hypertension 08-28-11   tx. meds  . IBS (irritable bowel syndrome)   . Neuromuscular disorder ([place]) 08-28-11   hx. Polio age 69-slight residual affects legs  . Pneumonia  12/2015   hospital x 3 days  . PONV (postoperative nausea and vomiting)   . Swelling of extremity 08-28-11   bilateral lower extremities- mid calf down    Past Surgical History:  Procedure Laterality Date  . ABDOMINAL HYSTERECTOMY  08-28-11   Partial-vaginal approach  . BUNIONECTOMY  08-28-11   right  . CARDIOVERSION N/A 08/05/2014   Procedure: CARDIOVERSION;  Surgeon: Heather Dresser, MD;  Location: [facility];  Service: Cardiovascular;  Laterality: N/A;  . CATARACT EXTRACTION, BILATERAL  08-28-11   bilateral  . EYE SURGERY     Cataract  . JOINT REPLACEMENT  Oct. 1, 2012   Right Knee  . KNEE ARTHROSCOPY  09/06/2011   Procedure: ARTHROSCOPY KNEE;  Surgeon: Heather Alf, MD;  Location: [facility];  Service: Orthopedics;  Laterality: Right;  with Synovectomy    Family History  Problem Relation Age of Onset  . Cancer Mother   . Heart disease Mother     Varicose Veins  . Hyperlipidemia Mother   . Hypertension Mother   . Heart disease Father     Heart Disease before age 63  . Hypertension Father   . Cancer Sister     Breast  . Stroke Maternal Grandmother   . Heart attack Brother   . Cancer Sister     breast    Social History   Social History  . Marital status: Married    Spouse name: Heather Benson  . Number of children: 3  . Years of education: 12   Occupational History  . Retired- [employer] History Main Topics  . Smoking status: Never Smoker  . Smokeless tobacco: Never Used  . Alcohol use No  . Drug use: No  . Sexual activity: Not Currently    Partners: Male   Other Topics Concern  . Not on file   Social History Narrative   Lives with husband, Heather Benson at home   Caffeine use: Drinks coffee/tea/caffeine free soda     Review of systems: Review of Systems  Constitutional: Negative for fever and chills.  HENT: Negative.   Eyes: Negative for blurred vision.  Respiratory: as per HPI  Cardiovascular: Negative for chest pain and palpitations.    Gastrointestinal: Negative for vomiting, diarrhea, blood per rectum. Genitourinary: Negative for dysuria, urgency, frequency and hematuria.  Musculoskeletal: Negative for myalgias, back pain and joint pain.  Skin: Negative for itching and rash.  Neurological: Negative for dizziness, tremors, focal weakness, seizures and loss of consciousness.  Endo/Heme/Allergies: Negative for environmental allergies.  Psychiatric/Behavioral: Negative for depression, suicidal ideas and hallucinations.  All other systems reviewed and are negative.   Physical Exam: There were no vitals taken for this visit. Gen:      No acute distress HEENT:  EOMI, sclera anicteric Neck:     No masses; no thyromegaly Lungs:    Clear to auscultation bilaterally; normal respiratory effort CV:  Regular rate and rhythm; no murmurs Abd:      + bowel sounds; soft, non-tender; no palpable masses, no distension Ext:    No edema; adequate peripheral perfusion Skin:      Warm and dry; no rash Neuro: alert and oriented x 3 Psych: normal mood and affect  Data Reviewed: ABG 12/13/15- 7.39/45/94/97%  ANA, RA 11/13/15- Negative  CXR 12/26/15- No acute cardiopulmonary abnormality. Right hemidiaphragm elevation that appears stable dating back to 2010. Images reviewed  CT scan chest 12/03/13- Bibasilar atelectasis R > L. Images reviewed.   V/Q scan 12/03/13- No PE  Echo 12/14/15 - Left ventricle: The cavity size was normal. There was moderate   concentric hypertrophy. Systolic function was normal. The   estimated ejection fraction was in the range of 55% to 60%. Left   ventricular diastolic function parameters were normal. - Aortic valve: There was very mild stenosis. There was trivial   regurgitation. Valve area (VTI): 1.23 cm^2. Valve area (Vmax):   1.51 cm^2. Valve area (Vmean): 1.27 cm^2. - Mitral valve: Calcified annulus. There was mild regurgitation. - Atrial septum: No defect or patent foramen ovale was  identified.  Assessment:  Assessment for chronic cough.  Her symptoms appear to be from upper airway cough syndrome from rhinitis, postnasal drip, GERD. She may have a component of asthmatic bronchitis but low FENO [18] in office today argues against airway inflammation. I will continue the Qvar for now and the Protonix. We need to get better control of her postnasal drip, rhinitis. I will change the Allegra to first generation antihistamine, chlorphentermine and change the Flonase to dymistsa nasal spray. She'll get scheduled for pulmonary function tests with a broncho dilator response. I'll also give her a flutter valve for clearance of secretions.  I educated her on behavioral changes to deal with cough including conscious suppression of the urge to cough, use of throat lozenges.  Plan/Recommendations: - Change allegra to chlorpheniramine 8 mg tid - Change flonase to dymista - Continue qvar, mucinex, protonix - Flutter valve - Schedule PFTs  Return in 1 month.  Marshell Garfinkel MD [facility] Pager [phone] 02/06/2016, 10:15 AM  CC: Heather Stains, MD

## 2016-02-06 NOTE — Patient Instructions (Signed)
Medication Instructions:  Same-no changes  Labwork: None  Testing/Procedures: None  Follow-Up: Your physician wants you to follow-up in: 6 months. You will receive a reminder letter in the mail two months in advance. If you don't receive a letter, please call our office to schedule the follow-up appointment.      If you need a refill on your cardiac medications before your next appointment, please call your pharmacy.   

## 2016-02-06 NOTE — Progress Notes (Signed)
Patient seen in the office today and instructed on use of Flutter Valve.  Patient expressed understanding and demonstrated technique. Parke Poisson, [facility] 02/06/16

## 2016-02-06 NOTE — Addendum Note (Signed)
Addended by: Parke Poisson E on: 02/06/2016 03:54 PM   Modules accepted: Orders

## 2016-02-06 NOTE — Addendum Note (Signed)
Addended by: Parke Poisson E on: 02/06/2016 12:47 PM   Modules accepted: Orders

## 2016-02-06 NOTE — Patient Instructions (Signed)
We will change the allegra to chlorpheniramine 8 mg tid Change flonase to dymista bid Continue qvar inhaler Continue protonix  We will check FENO and schedule you for PFTs.  Return in 1 month

## 2016-02-06 NOTE — Progress Notes (Signed)
Patient ID: Heather Benson, female   DOB: Nov 02, 1931, 82 y.o.   MRN: [id number]     Cardiology Office Note   Date:  02/06/2016   ID:  Heather Benson, DOB [birth year], MRN [id number]  PCP:  Vidal Schwalbe, MD    No chief complaint on file.  F/u PAF  Wt Readings from Last 3 Encounters:  02/06/16 209 lb (94.8 kg)  02/06/16 209 lb 9.6 oz (95.1 kg)  12/25/15 211 lb 12.8 oz (96.1 kg)       History of Present Illness: Heather Benson is a 82 y.o. female  with a hx of paroxysmal atrial fibrillation, HTN, LE edema, GERD. She was seen in February 2016 and was back in atrial fibrillation. She was placed on amiodarone and eventually underwent cardioversion 08/05/14. She was seen in follow-up on 08/23/14. She felt no different and continued to have dizziness as well as intermittent palpitations. Dizziness was worse with standing up. She denied syncope. She was noted to be bradycardic and her bisoprolol was decreased.   In 5/16, she Continued to c/o dizziness and was bradycardic. Her beta-blocker was stopped. FU ECG with improved HR.   Her heart rate improved. However, she remained dizzy. She was seen by outpatient neuro physical therapy as well as neurology. Testing for BPPV was negative. She did have orthostatic hypotension documented at least once. Most of her dizziness seems to occur while she standing. However, she does describe a spinning sensation at times.   She does get dizzy with activity. No syncope or falls. She continues to have shortness of breath with activity. She walks the dog for about 10 minutes at a time. She denies orthopnea or PND. She has chronic edema. This is unchanged. She denies syncope or near-syncope.  Overall, the dizziness and SHOB is better today.  Her biggest complaint is the cost of her Eliquis later in the year.  No bleeding issues.  She is back on Lasix for the leg swelling.    Very rarely, she feels palpitations at night.   She has been hospitalized  for PNA in 8/17.  She was there for 2 day and was quite sick.  She has persistent SHOB.  She does not walk much. They are not members of a gym but she could walk at home.  THey have exercise equipment at the church as well.   Edema in the ankles persists.  Diuretic decreased due to kidney function.       Past Medical History:  Diagnosis Date  . Aortic stenosis, mild 12/14/2015  . Arthritis 08-28-11   right knee pain-surgery planned  . Cancer [facility]) 08-28-11   Melonoma-cheek(many Yrs ago) , recent bx. left  face lesion, past skin cancer lesions  . CAP (community acquired pneumonia) 12/13/2015  . CHF (congestive heart failure) ([place])   . Complication of anesthesia 08-28-11   in past arrythmia-  cardiology has seen in past  . Dysrhythmia 08-28-11   only immed.  to several days after surgery- hx. A.fib. in past  . GERD (gastroesophageal reflux disease) 08-28-11   tx. omeprazole  . Hypertension 08-28-11   tx. meds  . IBS (irritable bowel syndrome)   . Neuromuscular disorder ([place]) 08-28-11   hx. Polio age 26-slight residual affects legs  . Pneumonia 12/2015   hospital x 3 days  . PONV (postoperative nausea and vomiting)   . Swelling of extremity 08-28-11   bilateral lower extremities- mid calf down    Past Surgical History:  Procedure  Laterality Date  . ABDOMINAL HYSTERECTOMY  08-28-11   Partial-vaginal approach  . BREAST REDUCTION SURGERY  1997  . BUNIONECTOMY  08-28-11   right  . CARDIOVERSION N/A 08/05/2014   Procedure: CARDIOVERSION;  Surgeon: Larey Dresser, MD;  Location: [facility];  Service: Cardiovascular;  Laterality: N/A;  . CATARACT EXTRACTION, BILATERAL  08-28-11   bilateral  . EYE SURGERY     Cataract  . JOINT REPLACEMENT  Oct. 1, 2012   Right Knee  . KNEE ARTHROSCOPY  09/06/2011   Procedure: ARTHROSCOPY KNEE;  Surgeon: Gearlean Alf, MD;  [facility];  Service: Orthopedics;  Laterality: Right;  with Synovectomy     Current Outpatient Prescriptions  Medication  Sig Dispense Refill  . acetaminophen (TYLENOL) 500 MG tablet Take 1,000 mg by mouth every 8 (eight) hours as needed for moderate pain (pain in legs).    [name] amiodarone (PACERONE) 200 MG tablet TAKE 1 TABLET (200 MG TOTAL) BY MOUTH DAILY. 90 tablet 3  . amLODipine (NORVASC) 2.5 MG tablet Take 1 tablet (2.5 mg total) by mouth daily. 90 tablet 0  . apixaban (ELIQUIS) 2.5 MG TABS tablet TAKE 1 TABLET (2.5 MG TOTAL) BY MOUTH 2 (TWO) TIMES DAILY. 60 tablet 2  . Azelastine-Fluticasone 137-50 MCG/ACT SUSP Place 1 spray into the nose 2 (two) times daily. 23 g 5  . Cholecalciferol (VITAMIN D3) 2000 UNITS capsule Take 2,000 Units by mouth daily.    . fexofenadine (ALLEGRA) 180 MG tablet Take 180 mg by mouth daily.    . fluticasone (FLONASE) 50 MCG/ACT nasal spray Place 1 spray into both nostrils daily as needed for allergies.     . furosemide (LASIX) 40 MG tablet Take 20 mg by mouth daily.    [name] latanoprost (XALATAN) 0.005 % ophthalmic solution Place 1 drop into both eyes at bedtime.  3  . meclizine (ANTIVERT) 25 MG tablet Take 12.5 mg by mouth 3 (three) times daily as needed for dizziness.   0  . OVER THE COUNTER MEDICATION Take 1 tablet by mouth daily. Move Free Ultra    . pantoprazole (PROTONIX) 40 MG tablet Take 40 mg by mouth daily.    [name] Respiratory Therapy Supplies (FLUTTER) DEVI Use as directed. 1 each 0  . tetrahydrozoline 0.05 % ophthalmic solution Place 1 drop into both eyes 2 (two) times daily as needed (for allergy eyes).      No current facility-administered medications for this visit.     Allergies:   Augmentin [amoxicillin-pot clavulanate]; Boniva [ibandronic acid]; Hydromorphone; Latex; Lisinopril; Codeine; and Tape    Social History:  The patient  reports that she has never smoked. She has never used smokeless tobacco. She reports that she does not drink alcohol or use drugs.   Family History:  The patient's family history includes Breast cancer in her sister; Colon cancer in her mother;  Emphysema in her father; Heart attack in her brother; Heart disease in her father and mother; Hyperlipidemia in her mother; Hypertension in her father and mother; Stroke in her maternal grandmother.    ROS:  Please see the history of present illness.   Otherwise, review of systems are positive for leg swelling, better with compression stocking.   All other systems are reviewed and negative.    PHYSICAL EXAM: VS:  BP 120/70   Pulse 62   Ht 5\' 6"  (1.676 m)   Wt 209 lb (94.8 kg)   SpO2 92%   BMI 33.73 kg/m  , BMI Body mass  index is 33.73 kg/m. GEN: Well nourished, well developed, in no acute distress  HEENT: normal  Neck: no JVD, carotid bruits, or masses Cardiac: RRR; no murmurs, rubs, or gallops,no edema  Respiratory:  clear to auscultation bilaterally, normal work of breathing GI: soft, nontender, nondistended, + BS MS: no deformity or atrophy  Skin: warm and dry, no rash Neuro:  Strength and sensation are intact Psych: euthymic mood, full affect    Recent Labs: 11/13/2015: TSH 1.400 12/13/2015: B Natriuretic Peptide 211.0; Magnesium 2.2 12/15/2015: ALT 22; BUN 25; Creatinine, Ser 1.92; Hemoglobin 10.6; Platelets 265; Potassium 3.9; Sodium 135   Lipid Panel    Component Value Date/Time   CHOL  05/27/2010 0505    168        ATP III CLASSIFICATION:  <200     mg/dL   Desirable  200-239  mg/dL   Borderline High  >=240    mg/dL   High          TRIG 95 05/27/2010 0505   HDL 51 05/27/2010 0505   CHOLHDL 3.3 05/27/2010 0505   VLDL 19 05/27/2010 0505   LDLCALC  05/27/2010 0505    98        Total Cholesterol/HDL:CHD Risk Coronary Heart Disease Risk Table                     Men   Women  1/2 Average Risk   3.4   3.3  Average Risk       5.0   4.4  2 X Average Risk   9.6   7.1  3 X Average Risk  23.4   11.0        Use the calculated Patient Ratio above and the CHD Risk Table to determine the patient's CHD Risk.        ATP III CLASSIFICATION (LDL):  <100     mg/dL    Optimal  100-129  mg/dL   Near or Above                    Optimal  130-159  mg/dL   Borderline  160-189  mg/dL   High  >190     mg/dL   Very High     Other studies Reviewed: Additional studies/ records that were reviewed today with results demonstrating: Creatinine was as high as 2.03 but has decreased 1.85 on last check with nephrologist..   ASSESSMENT AND PLAN:  1. AFib:  Maintaining NSR on Amiodarone. She is tolerating Eliquis. She remains on the correct dose. Most Recent TSH and LFTs ok.  Follwed with PMD.  Will have to get those records.  We'll contact Dr. Orest Dikes office. 2. SHOB: I have encouraged her to try to increase her activity. She needs to try to get up to 5 days a week 30 minutes a day of walking, exercise bike, water aerobics to increase stamina. . 3. Chronic kidney disease: Last creatinine was increased. She is already on the decreased dose of Eliquis. I asked her to drink plenty of water during the day to avoid dehydration.  Followed by Nephrology, Dr. Lorrene Reid.  Cr 1.92.  4. Lower extremity edema: She will resume wearing compression stockings out of the weather is getting cooler. I do not want to give her additional diuretic medication given her issues with dizziness which have been difficult to treat. 5. HTN:  Better today in the office.  Home BP Typically it is in the 123-135/66-79 range.  HR  is in the 66-78 range.  No bradycardia noted.     Current medicines are reviewed at length with the patient today.  The patient concerns regarding her medicines were addressed.  The following changes have been made:  No change  Labs/ tests ordered today include:   No orders of the defined types were placed in this encounter.   Recommend 150 minutes/week of aerobic exercise Low fat, low carb, high fiber diet recommended  Disposition:   FU in 9 months   Signed, Larae Grooms, MD  02/06/2016 2:48 PM    [facility] [address], [place],  [place]  [address] Phone: [phone]; Fax: [phone]

## 2016-02-09 DIAGNOSIS — J189 Pneumonia, unspecified organism: Secondary | ICD-10-CM | POA: Diagnosis not present

## 2016-02-09 DIAGNOSIS — N184 Chronic kidney disease, stage 4 (severe): Secondary | ICD-10-CM | POA: Diagnosis not present

## 2016-02-09 DIAGNOSIS — I480 Paroxysmal atrial fibrillation: Secondary | ICD-10-CM | POA: Diagnosis not present

## 2016-02-09 DIAGNOSIS — I130 Hypertensive heart and chronic kidney disease with heart failure and stage 1 through stage 4 chronic kidney disease, or unspecified chronic kidney disease: Secondary | ICD-10-CM | POA: Diagnosis not present

## 2016-02-09 DIAGNOSIS — I5030 Unspecified diastolic (congestive) heart failure: Secondary | ICD-10-CM | POA: Diagnosis not present

## 2016-02-09 DIAGNOSIS — D631 Anemia in chronic kidney disease: Secondary | ICD-10-CM | POA: Diagnosis not present

## 2016-03-04 DIAGNOSIS — H401211 Low-tension glaucoma, right eye, mild stage: Secondary | ICD-10-CM | POA: Diagnosis not present

## 2016-03-06 ENCOUNTER — Ambulatory Visit (HOSPITAL_COMMUNITY)
Admission: RE | Admit: 2016-03-06 | Discharge: 2016-03-06 | Disposition: A | Discharge status: Home / Self Care | Payer: Medicare Other | Source: Ambulatory Visit | Attending: Pulmonary Disease | Admitting: Pulmonary Disease

## 2016-03-06 ENCOUNTER — Ambulatory Visit (INDEPENDENT_AMBULATORY_CARE_PROVIDER_SITE_OTHER): Payer: Medicare Other | Admitting: Pulmonary Disease

## 2016-03-06 ENCOUNTER — Ambulatory Visit: Payer: Medicare Other | Admitting: Pulmonary Disease

## 2016-03-06 ENCOUNTER — Encounter: Payer: Self-pay | Admitting: Pulmonary Disease

## 2016-03-06 VITALS — BP 130/68 | HR 75 | Ht 66.0 in | Wt 207.8 lb

## 2016-03-06 DIAGNOSIS — R059 Cough, unspecified: Secondary | ICD-10-CM

## 2016-03-06 DIAGNOSIS — R05 Cough: Secondary | ICD-10-CM

## 2016-03-06 LAB — PULMONARY FUNCTION TEST
DL/VA % PRED: 66 %
DL/VA: 3.35 ml/min/mmHg/L
DLCO unc % pred: 38 %
DLCO unc: 10.48 ml/min/mmHg
FEF 25-75 POST: 3.08 L/s
FEF 25-75 Pre: 2.57 L/sec
FEF2575-%CHANGE-POST: 19 %
FEF2575-%PRED-POST: 218 %
FEF2575-%PRED-PRE: 182 %
FEV1-%Change-Post: 1 %
FEV1-%Pred-Post: 86 %
FEV1-%Pred-Pre: 85 %
FEV1-PRE: 1.75 L
FEV1-Post: 1.77 L
FEV1FVC-%CHANGE-POST: -7 %
FEV1FVC-%PRED-PRE: 130 %
FEV6-%CHANGE-POST: 9 %
FEV6-%Pred-Post: 76 %
FEV6-%Pred-Pre: 70 %
FEV6-Post: 2 L
FEV6-Pre: 1.83 L
FEV6FVC-%Pred-Post: 105 %
FEV6FVC-%Pred-Pre: 105 %
FVC-%Change-Post: 9 %
FVC-%PRED-POST: 72 %
FVC-%Pred-Pre: 66 %
FVC-PRE: 1.83 L
FVC-Post: 2 L
POST FEV1/FVC RATIO: 89 %
PRE FEV1/FVC RATIO: 96 %
Post FEV6/FVC ratio: 100 %
Pre FEV6/FVC Ratio: 100 %
RV % pred: 69 %
RV: 1.77 L
TLC % PRED: 69 %
TLC: 3.74 L

## 2016-03-06 MED ORDER — ALBUTEROL SULFATE (2.5 MG/3ML) 0.083% IN NEBU
2.5000 mg | INHALATION_SOLUTION | Freq: Once | RESPIRATORY_TRACT | Status: AC
  Administered 2016-03-06: 2.5 mg via RESPIRATORY_TRACT

## 2016-03-06 MED ORDER — CHLORPHENIRAMINE MALEATE 4 MG PO TABS: 8.0000 mg | ORAL_TABLET | Freq: Three times a day (TID) | ORAL | 1 refills | Status: DC

## 2016-03-06 NOTE — Patient Instructions (Addendum)
Stop taking allegra Instead use Chlorpheniramine 8 mg 3 times a day. Continue using the dymista nasal spray and the Qvar inhaler Continue Protonix for acid reflux  Return to clinic in 3 months.

## 2016-03-06 NOTE — Progress Notes (Signed)
Heather Benson    [id number]    [birth year]  Primary Care Physician:WHITE,CYNTHIA S, MD  Referring Physician: [name], MD [place], [place] [address]  Chief complaint:  Follow up for chronic cough  HPI: Heather Benson is a 82 year old with complaints of chronic cough for the past 1 year. She has daily symptoms with yellow/white mucus production, no fevers, chills. This is associated with dyspnea on exertion. She denies any dyspnea at rest. She denies any wheezing, hemoptysis. She has daily symptoms of allergic rhinitis. She denies any postnasal drip. She denies any heartburn symptoms but she is on a Protonix by mouth daily. She was admitted in August of 2017 for left lower lobe CAP, treated with azithromycin. However the symptoms of cough preceded this episode. She was on lisinopril in the past which was stopped due to cough but symptoms have persisted. She was on hycodan cough syrup in past with no effect.   Interim history: She continues on Qvar inhaler. There is no change in symptoms. She still has baseline dyspnea on exertion, chronic cough with mucus.   Outpatient Encounter Prescriptions as of 03/06/2016  Medication Sig  . acetaminophen (TYLENOL) 500 MG tablet Take 1,000 mg by mouth every 8 (eight) hours as needed for moderate pain (pain in legs).  [name] amiodarone (PACERONE) 200 MG tablet TAKE 1 TABLET (200 MG TOTAL) BY MOUTH DAILY.  [name] amLODipine (NORVASC) 2.5 MG tablet Take 1 tablet (2.5 mg total) by mouth daily.  [name] apixaban (ELIQUIS) 2.5 MG TABS tablet TAKE 1 TABLET (2.5 MG TOTAL) BY MOUTH 2 (TWO) TIMES DAILY.  [name] Azelastine-Fluticasone (DYMISTA) 137-50 MCG/ACT SUSP Place 1 spray into the nose 2 (two) times daily.  . Azelastine-Fluticasone 137-50 MCG/ACT SUSP Place 1 spray into the nose 2 (two) times daily.  . beclomethasone (QVAR) 80 MCG/ACT inhaler Inhale 2 puffs into the lungs 2 (two) times daily.  . Cholecalciferol (VITAMIN D3) 2000 UNITS  capsule Take 2,000 Units by mouth daily.  . fexofenadine (ALLEGRA) 180 MG tablet Take 180 mg by mouth daily.  . fluticasone (FLONASE) 50 MCG/ACT nasal spray Place 1 spray into both nostrils daily as needed for allergies.   . furosemide (LASIX) 40 MG tablet Take 20 mg by mouth daily.  [name] guaiFENesin (MUCINEX) 600 MG 12 hr tablet Take 600 mg by mouth 2 (two) times daily.  [name] latanoprost (XALATAN) 0.005 % ophthalmic solution Place 1 drop into both eyes at bedtime.  . meclizine (ANTIVERT) 25 MG tablet Take 12.5 mg by mouth 3 (three) times daily as needed for dizziness.   [name] OVER THE COUNTER MEDICATION Take 1 tablet by mouth daily. Move Free Ultra  . pantoprazole (PROTONIX) 40 MG tablet Take 40 mg by mouth daily.  [name] Respiratory Therapy Supplies (FLUTTER) DEVI Use as directed.  . tetrahydrozoline 0.05 % ophthalmic solution Place 1 drop into both eyes 2 (two) times daily as needed (for allergy eyes).    No facility-administered encounter medications on file as of 03/06/2016.     Allergies as of 03/06/2016 - Review Complete 03/06/2016  Allergen Reaction Noted  . Augmentin [amoxicillin-pot clavulanate] Diarrhea and Nausea Only 12/13/2015  . Boniva [ibandronic acid] Diarrhea 11/09/2015  . Hydromorphone Nausea And Vomiting 08/05/2014  . Latex Other (See Comments) 12/13/2015  . Lisinopril Cough 11/09/2015  . Codeine Other (See Comments) 08/19/2011  . Tape Itching and Rash 12/13/2015    Past Medical History:  Diagnosis Date  . Aortic  stenosis, mild 12/14/2015  . Arthritis 08-28-11   right knee pain-surgery planned  . [facility]) 08-28-11   Melonoma-cheek(many Yrs ago) , recent bx. left  face lesion, past skin cancer lesions  . CAP (community acquired pneumonia) 12/13/2015  . CHF (congestive heart failure) ([place])   . Complication of anesthesia 08-28-11   in past arrythmia-  cardiology has seen in past  . Dysrhythmia 08-28-11   only immed.  to several days after surgery- hx. A.fib. in past  . GERD  (gastroesophageal reflux disease) 08-28-11   tx. omeprazole  . Hypertension 08-28-11   tx. meds  . IBS (irritable bowel syndrome)   . Neuromuscular disorder ([place]) 08-28-11   hx. Polio age 81-slight residual affects legs  . Pneumonia 12/2015   hospital x 3 days  . PONV (postoperative nausea and vomiting)   . Swelling of extremity 08-28-11   bilateral lower extremities- mid calf down    Past Surgical History:  Procedure Laterality Date  . ABDOMINAL HYSTERECTOMY  08-28-11   Partial-vaginal approach  . BREAST REDUCTION SURGERY  1997  . BUNIONECTOMY  08-28-11   right  . CARDIOVERSION N/A 08/05/2014   Procedure: CARDIOVERSION;  Surgeon: Larey Dresser, MD;  Location: [facility];  Service: Cardiovascular;  Laterality: N/A;  . CATARACT EXTRACTION, BILATERAL  08-28-11   bilateral  . EYE SURGERY     Cataract  . JOINT REPLACEMENT  Oct. 1, 2012   Right Knee  . KNEE ARTHROSCOPY  09/06/2011   Procedure: ARTHROSCOPY KNEE;  Surgeon: Gearlean Alf, MD;  Location: [facility];  Service: Orthopedics;  Laterality: Right;  with Synovectomy    Family History  Problem Relation Age of Onset  . Heart disease Mother     Varicose Veins  . Hyperlipidemia Mother   . Hypertension Mother   . Colon cancer Mother   . Heart disease Father     Heart Disease before age 23  . Hypertension Father   . Emphysema Father   . Breast cancer Sister   . Stroke Maternal Grandmother   . Heart attack Brother     Social History   Social History  . Marital status: Married    Spouse name: [name]  . Number of children: 3  . Years of education: 12   Occupational History  . Retired- [employer] History Main Topics  . Smoking status: Never Smoker  . Smokeless tobacco: Never Used  . Alcohol use No  . Drug use: No  . Sexual activity: Not Currently    Partners: Male   Other Topics Concern  . Not on file   Social History Narrative   Lives with husband, Hilbert Odor at home   Caffeine use: Drinks  coffee/tea/caffeine free soda   OCCUPATION: retired      Review of systems: Review of Systems  Constitutional: Negative for fever and chills.  HENT: Negative.   Eyes: Negative for blurred vision.  Respiratory: as per HPI  Cardiovascular: Negative for chest pain and palpitations.  Gastrointestinal: Negative for vomiting, diarrhea, blood per rectum. Genitourinary: Negative for dysuria, urgency, frequency and hematuria.  Musculoskeletal: Negative for myalgias, back pain and joint pain.  Skin: Negative for itching and rash.  Neurological: Negative for dizziness, tremors, focal weakness, seizures and loss of consciousness.  Endo/Heme/Allergies: Negative for environmental allergies.  Psychiatric/Behavioral: Negative for depression, suicidal ideas and hallucinations.  All other systems reviewed and are negative.   Physical Exam: There were no vitals taken for this  visit. Gen:      No acute distress HEENT:  EOMI, sclera anicteric Neck:     No masses; no thyromegaly Lungs:    Clear to auscultation bilaterally; normal respiratory effort CV:         Regular rate and rhythm; no murmurs Abd:      + bowel sounds; soft, non-tender; no palpable masses, no distension Ext:    No edema; adequate peripheral perfusion Skin:      Warm and dry; no rash Neuro: alert and oriented x 3 Psych: normal mood and affect  Data Reviewed: ABG 12/13/15- 7.39/45/94/97%  ANA, RA 11/13/15- Negative  CXR 12/26/15- No acute cardiopulmonary abnormality. Right hemidiaphragm elevation that appears stable dating back to 2010. Images reviewed  CT scan chest 12/03/13- Bibasilar atelectasis R > L. Images reviewed.   V/Q scan 12/03/13- No PE  Echo 12/14/15 - Left ventricle: The cavity size was normal. There was moderate   concentric hypertrophy. Systolic function was normal. The   estimated ejection fraction was in the range of 55% to 60%. Left   ventricular diastolic function parameters were normal. - Aortic valve: There  was very mild stenosis. There was trivial   regurgitation. Valve area (VTI): 1.23 cm^2. Valve area (Vmax):   1.51 cm^2. Valve area (Vmean): 1.27 cm^2. - Mitral valve: Calcified annulus. There was mild regurgitation. - Atrial septum: No defect or patent foramen ovale was identified.  PFTs 03/06/16 FVC 2.0 (72%) FEV1 1.77 (86%) F/F 89 TLC 69% DLCO 38%   Assessment:  Chronic cough. Her symptoms appear to be from upper airway cough syndrome from rhinitis, postnasal drip, GERD. She may have a component of asthmatic bronchitis but low FENO [18] in office argues against airway inflammation. I will continue the Qvar for now and the Protonix. Change the Allegra to first generation antihistamine, chlorphentermine and change the Flonase to dymistsa nasal spray.Continue flutter valve for clearance of secretions.  I re educated her on behavioral changes to deal with cough including conscious suppression of the urge to cough, use of throat lozenges.  Plan/Recommendations: - Chlorpheniramine 8 mg tid, dymista - Continue qvar, mucinex, protonix - Flutter valve Return in 3 months.  Marshell Garfinkel MD [facility] Pager [phone] 03/06/2016, 12:18 PM  CC: [name], MD

## 2016-03-26 ENCOUNTER — Encounter: Payer: Self-pay | Admitting: Diagnostic Neuroimaging

## 2016-03-26 ENCOUNTER — Ambulatory Visit (INDEPENDENT_AMBULATORY_CARE_PROVIDER_SITE_OTHER): Payer: Medicare Other | Admitting: Diagnostic Neuroimaging

## 2016-03-26 VITALS — BP 118/65 | HR 66 | Wt 209.0 lb

## 2016-03-26 DIAGNOSIS — R269 Unspecified abnormalities of gait and mobility: Secondary | ICD-10-CM

## 2016-03-26 DIAGNOSIS — L570 Actinic keratosis: Secondary | ICD-10-CM | POA: Diagnosis not present

## 2016-03-26 DIAGNOSIS — I951 Orthostatic hypotension: Secondary | ICD-10-CM | POA: Diagnosis not present

## 2016-03-26 DIAGNOSIS — R42 Dizziness and giddiness: Secondary | ICD-10-CM | POA: Diagnosis not present

## 2016-03-26 DIAGNOSIS — L821 Other seborrheic keratosis: Secondary | ICD-10-CM | POA: Diagnosis not present

## 2016-03-26 DIAGNOSIS — Z85820 Personal history of malignant melanoma of skin: Secondary | ICD-10-CM | POA: Diagnosis not present

## 2016-03-26 DIAGNOSIS — Z85828 Personal history of other malignant neoplasm of skin: Secondary | ICD-10-CM | POA: Diagnosis not present

## 2016-03-26 NOTE — Progress Notes (Signed)
[facility]  PATIENT: Heather Benson DOB: [birth year]  REFERRING CLINICIAN: C White  HISTORY FROM: patient  REASON FOR VISIT: follow up    HISTORICAL  CHIEF COMPLAINT:  Chief Complaint  Patient presents with  . Orthostatic hypotension    rm 7, husband- [name], "more dizziness, mostly when I get up and walk; please review MRI from July"  . Follow-up    3 month    HISTORY OF PRESENT ILLNESS:   UPDATE 03/26/16: Since last visit, continues with similar symptoms. Continues with 2 types of dizziness: lightheadedness and decreased BP with standing; also with general balance difficulty.  UPDATE 12/25/15: Since last visit patient is doing slightly better. Her episodes of dizziness have reduced, but continue. Now she is using a cane and has had physical therapy evaluation.  PRIOR HPI (11/23/15 ) 82 year old right-handed female here for evaluation of dizziness. Since 2012 patient has had intermittent episodes of lightheadedness, fear of passing out, almost blacking out sensations especially when she stands up from sitting position or if she is standing up for a long time. This has happened several times when she is at church. She typically grabs onto something, steadies herself, and then avoid passing out. Episodes are happening frequently almost once per day. Patient was evaluated by neurology and cardiology in the past for this problem, found to have orthostatic hypotension, advised to increase fluid intake, adjust blood pressure medication and gradually move from sitting to standing position. In spite of these interventions patient has continued to have problems. Patient has another type of "dizziness" problem which she describes as a balance problem. Patient describes at least one year of progressive unsteadiness with balance and walking. She has not fallen down. She typically uses a cane if she has to walk a long distance. Otherwise she tends to hold onto furniture and people  to balance herself. She denies any room spinning or vertigo, nausea or vomiting with these attacks. Patient has noticed some intermittent tremor in her arms. Patient denies any change in smell, taste, sleep, anxiety or constipation. No unilateral numbness, tingling, weakness, slurred speech or trouble talking. She has had a chronic cough for the past one year. People tell her that she tends to speak quietly.   REVIEW OF SYSTEMS: Full 14 system review of systems performed and negative with exception of:  dizziness cough snoring.    ALLERGIES: Allergies  Allergen Reactions  . Augmentin [Amoxicillin-Pot Clavulanate] Diarrhea and Nausea Only  . Boniva [Ibandronic Acid] Diarrhea  . Hydromorphone Nausea And Vomiting  . Latex Other (See Comments)    Unknown  . Lisinopril Cough  . Codeine Other (See Comments)    "Spaced out feeling"  . Tape Itching and Rash    HOME MEDICATIONS: Outpatient Medications Prior to Visit  Medication Sig Dispense Refill  . acetaminophen (TYLENOL) 500 MG tablet Take 1,000 mg by mouth every 8 (eight) hours as needed for moderate pain (pain in legs).    [name] amiodarone (PACERONE) 200 MG tablet TAKE 1 TABLET (200 MG TOTAL) BY MOUTH DAILY. 90 tablet 3  . amLODipine (NORVASC) 2.5 MG tablet Take 1 tablet (2.5 mg total) by mouth daily. 90 tablet 0  . apixaban (ELIQUIS) 2.5 MG TABS tablet TAKE 1 TABLET (2.5 MG TOTAL) BY MOUTH 2 (TWO) TIMES DAILY. 60 tablet 2  . Azelastine-Fluticasone 137-50 MCG/ACT SUSP Place 1 spray into the nose 2 (two) times daily. 23 g 5  . beclomethasone (QVAR) 80 MCG/ACT inhaler Inhale 2 puffs into the lungs  2 (two) times daily.    . chlorpheniramine (CHLORPHEN) 4 MG tablet Take 2 tablets (8 mg total) by mouth 3 (three) times daily. 90 tablet 1  . Cholecalciferol (VITAMIN D3) 2000 UNITS capsule Take 2,000 Units by mouth daily.    . fexofenadine (ALLEGRA) 180 MG tablet Take 180 mg by mouth daily.    . fluticasone (FLONASE) 50 MCG/ACT nasal spray Place 1  spray into both nostrils daily as needed for allergies.     . furosemide (LASIX) 40 MG tablet Take 20 mg by mouth daily.    [name] guaiFENesin (MUCINEX) 600 MG 12 hr tablet Take 600 mg by mouth 2 (two) times daily.    [name] latanoprost (XALATAN) 0.005 % ophthalmic solution Place 1 drop into both eyes at bedtime.  3  . meclizine (ANTIVERT) 25 MG tablet Take 12.5 mg by mouth 3 (three) times daily as needed for dizziness.   0  . OVER THE COUNTER MEDICATION Take 1 tablet by mouth daily. Move Free Ultra    . pantoprazole (PROTONIX) 40 MG tablet Take 40 mg by mouth daily.    [name] Respiratory Therapy Supplies (FLUTTER) DEVI Use as directed. 1 each 0  . tetrahydrozoline 0.05 % ophthalmic solution Place 1 drop into both eyes 2 (two) times daily as needed (for allergy eyes).     . Azelastine-Fluticasone (DYMISTA) 137-50 MCG/ACT SUSP Place 1 spray into the nose 2 (two) times daily. 1 Bottle 0   No facility-administered medications prior to visit.     PAST MEDICAL HISTORY: Past Medical History:  Diagnosis Date  . Aortic stenosis, mild 12/14/2015  . Arthritis 08-28-11   right knee pain-surgery planned  . Cancer [facility]) 08-28-11   Melonoma-cheek(many Yrs ago) , recent bx. left  face lesion, past skin cancer lesions  . CAP (community acquired pneumonia) 12/13/2015  . CHF (congestive heart failure) ([place])   . Complication of anesthesia 08-28-11   in past arrythmia-  cardiology has seen in past  . Dysrhythmia 08-28-11   only immed.  to several days after surgery- hx. A.fib. in past  . GERD (gastroesophageal reflux disease) 08-28-11   tx. omeprazole  . Hypertension 08-28-11   tx. meds  . IBS (irritable bowel syndrome)   . Neuromuscular disorder ([place]) 08-28-11   hx. Polio age 35-slight residual affects legs  . Pneumonia 12/2015   hospital x 3 days  . PONV (postoperative nausea and vomiting)   . Swelling of extremity 08-28-11   bilateral lower extremities- mid calf down    PAST SURGICAL HISTORY: Past Surgical History:   Procedure Laterality Date  . ABDOMINAL HYSTERECTOMY  08-28-11   Partial-vaginal approach  . BREAST REDUCTION SURGERY  1997  . BUNIONECTOMY  08-28-11   right  . CARDIOVERSION N/A 08/05/2014   Procedure: CARDIOVERSION;  Surgeon: Larey Dresser, MD;  Location: [facility];  Service: Cardiovascular;  Laterality: N/A;  . CATARACT EXTRACTION, BILATERAL  08-28-11   bilateral  . EYE SURGERY     Cataract  . JOINT REPLACEMENT  Oct. 1, 2012   Right Knee  . KNEE ARTHROSCOPY  09/06/2011   Procedure: ARTHROSCOPY KNEE;  Surgeon: Gearlean Alf, MD;  Location: [facility];  Service: Orthopedics;  Laterality: Right;  with Synovectomy    FAMILY HISTORY: Family History  Problem Relation Age of Onset  . Heart disease Mother     Varicose Veins  . Hyperlipidemia Mother   . Hypertension Mother   . Colon cancer Mother   . Heart disease Father  Heart Disease before age 7  . Hypertension Father   . Emphysema Father   . Breast cancer Sister   . Stroke Maternal Grandmother   . Heart attack Brother     SOCIAL HISTORY:  Social History   Social History  . Marital status: Married    Spouse name: [name]  . Number of children: 3  . Years of education: 12   Occupational History  . Retired- [employer] History Main Topics  . Smoking status: Never Smoker  . Smokeless tobacco: Never Used  . Alcohol use No  . Drug use: No  . Sexual activity: Not Currently    Partners: Male   Other Topics Concern  . Not on file   Social History Narrative   Lives with husband, Hilbert Odor at home   Caffeine use: Drinks coffee/tea/caffeine free soda   OCCUPATION: retired      PHYSICAL EXAM   GENERAL EXAM/CONSTITUTIONAL: Vitals:  Vitals:   03/26/16 1519  BP: 118/65  Pulse: 66  Weight: 209 lb (94.8 kg)    Orthostatic VS for the past 24 hrs (Last 3 readings):  BP- Lying Pulse- Lying BP- Sitting Pulse- Sitting BP- Standing at 0 minutes Pulse- Standing at 0 minutes  03/26/16 1521 132/63 64 118/65  66 95/62 72   Body mass index is 33.73 kg/m. No exam data present  Patient is in no distress; well developed, nourished and groomed;   NECK HAS DECR ROM  MASKED FACIES  SOFT VOICE  CARDIOVASCULAR:  Examination of carotid arteries is normal; no carotid bruits  Regular rate and rhythm, no murmurs  Examination of peripheral vascular system by observation and palpation is normal  EYES:  Ophthalmoscopic exam of optic discs and posterior segments is normal; no papilledema or hemorrhages  MUSCULOSKELETAL:  Gait, strength, tone, movements noted in Neurologic exam below  NEUROLOGIC: MENTAL STATUS:  No flowsheet data found.  awake, alert, oriented to person, place and time  DECR recent and remote memory intact  normal attention and concentration  language fluent, comprehension intact, naming intact,   fund of knowledge appropriate  CRANIAL NERVE:   2nd - no papilledema on fundoscopic exam  2nd, 3rd, 4th, 6th - pupils equal and reactive to light, visual fields full to confrontation, extraocular muscles intact, no nystagmus  5th - facial sensation symmetric  7th - facial strength symmetric  8th - hearing intact  9th - palate elevates symmetrically, uvula midline  11th - shoulder shrug symmetric  12th - tongue protrusion midline  MOTOR:   MILD POSTURAL AND ACTION TREMOR IN BUE (LEFT WORSE THAN RIGHT)  NO RESTING TREMOR  BRADYKINESIA IN BUE AND FOOT TAPPING; MILD APRAXIA  normal bulk, full strength in the BUE, BLE; EXCEPT HIP FLEX 4+ BILATERALLY  SENSORY:   normal and symmetric to light touch; ABSENT VIB AT TOES  COORDINATION:   finger-nose-finger, fine finger movements SLOW BILATERALLY  REFLEXES:   deep tendon reflexes BUE 2, knees 2, ankles traec  POSITIVE SNOUT AND MYERSON'S REFLEXES  GAIT/STATION:   SLOW TO RISE; SHORT STEPS; UNSTEADY; USES SINGLE POINT CANE    DIAGNOSTIC DATA (LABS, IMAGING, TESTING) - I reviewed patient records,  labs, notes, testing and imaging myself where available.  Lab Results  Component Value Date   WBC 6.7 12/15/2015   HGB 10.6 (L) 12/15/2015   HCT 35.3 (L) 12/15/2015   MCV 82.9 12/15/2015   PLT 265 12/15/2015      Component Value Date/Time  NA 135 12/15/2015 0430   K 3.9 12/15/2015 0430   CL 96 (L) 12/15/2015 0430   CO2 31 12/15/2015 0430   GLUCOSE 108 (H) 12/15/2015 0430   BUN 25 (H) 12/15/2015 0430   CREATININE 1.92 (H) 12/15/2015 0430   CREATININE 2.03 (H) 02/27/2015 1051   CALCIUM 8.7 (L) 12/15/2015 0430   PROT 6.0 (L) 12/15/2015 0430   PROT 7.2 11/13/2015 1305   ALBUMIN 2.9 (L) 12/15/2015 0430   AST 37 12/15/2015 0430   ALT 22 12/15/2015 0430   ALKPHOS 54 12/15/2015 0430   BILITOT 0.3 12/15/2015 0430   GFRNONAA 23 (L) 12/15/2015 0430   GFRAA 27 (L) 12/15/2015 0430   Lab Results  Component Value Date   CHOL  05/27/2010    168        ATP III CLASSIFICATION:  <200     mg/dL   Desirable  200-239  mg/dL   Borderline High  >=240    mg/dL   High          HDL 51 05/27/2010   LDLCALC  05/27/2010    98        Total Cholesterol/HDL:CHD Risk Coronary Heart Disease Risk Table                     Men   Women  1/2 Average Risk   3.4   3.3  Average Risk       5.0   4.4  2 X Average Risk   9.6   7.1  3 X Average Risk  23.4   11.0        Use the calculated Patient Ratio above and the CHD Risk Table to determine the patient's CHD Risk.        ATP III CLASSIFICATION (LDL):  <100     mg/dL   Optimal  100-129  mg/dL   Near or Above                    Optimal  130-159  mg/dL   Borderline  160-189  mg/dL   High  >190     mg/dL   Very High   TRIG 95 05/27/2010   CHOLHDL 3.3 05/27/2010   Lab Results  Component Value Date   HGBA1C (H) 05/26/2010    5.8 (NOTE)                                                                       According to the ADA Clinical Practice Recommendations for 2011, when HbA1c is used as a screening test:   >=6.5%   Diagnostic of Diabetes  Mellitus           (if abnormal result  is confirmed)  5.7-6.4%   Increased risk of developing Diabetes Mellitus  References:Diagnosis and Classification of Diabetes Mellitus,Diabetes OIZT,2458,09(XIPJA 1):S62-S69 and Standards of Medical Care in         Diabetes - 2011,Diabetes Care,2011,34  (Suppl 1):S11-S61.   Lab Results  Component Value Date   [id number] 419 11/13/2015   Lab Results  Component Value Date   TSH 1.400 11/13/2015    02/04/14 TTE  - Low normal left ventricular systolic and mildly reduced rightventricular function. This might be  underestimated by rapidventricular rate.  12/03/13 CT chest [I reviewed images myself and agree with interpretation. -VRP]  1. Mild bibasilar atelectasis noted, more prominent on the right. Minimal hazy opacity at the anterior right upper lobe and inferior aspect of the left upper lobe likely also reflects atelectasis. Infection is thought to be less likely. 2. Dense calcification at the mitral valve. 3. Scattered coronary artery calcifications seen. 4. Apparent mild prominence of the pulmonary arteries, which could reflect mild pulmonary arterial hypertension. 5. Likely small bilateral renal cysts seen. 6. Tiny hiatal hernia noted. 7. Stable chronic loss of height at vertebral body T12.  11/23/15 MRI brain [I reviewed images myself and agree with interpretation. -VRP]  1.    Moderate cortical atrophy that is a little more pronounced in the mesial temporal lobes. 2.    Mild chronic microvascular ischemic change, predominantly in the periventricular white matter and with a small focus in the left cerebellum.   3.    There are no acute findings.   11/23/15 MRI cervical [I reviewed images myself and agree with interpretation. -VRP]  1.   At C3-C4, there are predominantly left-sided degenerative changes causing moderate foraminal narrowing. Although there does not appear to be nerve root compression there is some encroachment upon the exiting left C4  nerve root. 2.   At C4-C5 there are degenerative changes nerve root impingement. On the axial gradient echo MEDIC images (image #11) there is a suggestion of hyperintense signal within the right side of the spinal cord but this is not seen on any of the other sequences and is thus more likely to be artifactual than due to myelopathy.  3.   At C5-C6 there is a right disc osteophyte complex distorting the right side of the thecal sac without causing spinal cord compression or nerve root compression.     ASSESSMENT AND PLAN  82 y.o. year old female here with 2 types of dizziness sensations since 2012.   The first type consistent orthostatic lightheadedness associated with orthostatic hypotension. The second type consistent of balance and walking problem. Neurologic examination notable for orthostatic hypotension on exam today (> 30 point drop in systolic pressure with moving from laying to standing, with inadequate compensatory tachycardia).   The second dizziness is related to general balance difficulty, in setting of subtle tremor, bradykinesia, postural instability. Findings raise possibility for parkinsonism but not clear cut. (multiple system atrophy versus idiopathic Parkinson's disease).    Ddx: autonomic neuropathy, neurogenic orthostatic hypotension, cardiogenic orthostatic hypotension, parkinsonism (MSA-a vs parkinson's disease)  No diagnosis found.   PLAN: - use rollator walker - caution with gait, walking, driving - could consider florinef or midodrine if orthostatic hypotension worsens; would defer to cardiology/nephrology given her cardiac and renal issues  Orders Placed This Encounter  Procedures  . For home use only DME 4 wheeled rolling walker with seat   Return in about 6 months (around 09/23/2016).     Penni Bombard, MD 56/31/4970, 2:63 PM Certified in Neurology, Neurophysiology and Neuroimaging  [facility] [address] [place], [place] [address] [phone]

## 2016-04-11 DIAGNOSIS — R05 Cough: Secondary | ICD-10-CM | POA: Diagnosis not present

## 2016-04-11 DIAGNOSIS — Z6841 Body Mass Index (BMI) 40.0 and over, adult: Secondary | ICD-10-CM | POA: Diagnosis not present

## 2016-04-11 DIAGNOSIS — R42 Dizziness and giddiness: Secondary | ICD-10-CM | POA: Diagnosis not present

## 2016-04-11 DIAGNOSIS — N2581 Secondary hyperparathyroidism of renal origin: Secondary | ICD-10-CM | POA: Diagnosis not present

## 2016-04-11 DIAGNOSIS — I10 Essential (primary) hypertension: Secondary | ICD-10-CM | POA: Diagnosis not present

## 2016-04-11 DIAGNOSIS — N184 Chronic kidney disease, stage 4 (severe): Secondary | ICD-10-CM | POA: Diagnosis not present

## 2016-04-11 DIAGNOSIS — I480 Paroxysmal atrial fibrillation: Secondary | ICD-10-CM | POA: Diagnosis not present

## 2016-04-22 ENCOUNTER — Telehealth: Payer: Self-pay | Admitting: Interventional Cardiology

## 2016-04-22 NOTE — Telephone Encounter (Signed)
New Message  Pt call requesting to speak with RN. Pt states she was instructed by Dr Lu Duffel to call the our office to getting lab work completed. Pt would like an order to be put in the system to get labs completed. Please call back to discuss

## 2016-04-22 NOTE — Telephone Encounter (Signed)
Pt incorrectly called the wrong office.  Pt states she was instructed by Dr Lu Duffel, some weeks ago, to call [facility] and report there to have labs done.  Pt states she was to have this done on 12/7, but completely forgot to go.  Pt states she is a bit confused on what to do.  Informed the pt that this is her cardiology office.  Advised the pt to call the ordering Providers office (Dr Lu Duffel) back, and ask specific instructions on what to do.  Informed the pt that if she were to need any further assistance from our office, please follow back accordingly.  Pt verbalized understanding and gracious for all the assistance provided.

## 2016-04-30 DIAGNOSIS — N2581 Secondary hyperparathyroidism of renal origin: Secondary | ICD-10-CM | POA: Diagnosis not present

## 2016-04-30 DIAGNOSIS — N184 Chronic kidney disease, stage 4 (severe): Secondary | ICD-10-CM | POA: Diagnosis not present

## 2016-05-05 ENCOUNTER — Other Ambulatory Visit: Payer: Self-pay | Admitting: Cardiology

## 2016-05-05 MED ORDER — APIXABAN 2.5 MG PO TABS: ORAL_TABLET | ORAL | 6 refills | Status: DC

## 2016-05-15 DIAGNOSIS — Z1231 Encounter for screening mammogram for malignant neoplasm of breast: Secondary | ICD-10-CM | POA: Diagnosis not present

## 2016-05-15 DIAGNOSIS — Z803 Family history of malignant neoplasm of breast: Secondary | ICD-10-CM | POA: Diagnosis not present

## 2016-05-15 DIAGNOSIS — N184 Chronic kidney disease, stage 4 (severe): Secondary | ICD-10-CM | POA: Diagnosis not present

## 2016-05-22 DIAGNOSIS — Z471 Aftercare following joint replacement surgery: Secondary | ICD-10-CM | POA: Diagnosis not present

## 2016-05-22 DIAGNOSIS — Z96651 Presence of right artificial knee joint: Secondary | ICD-10-CM | POA: Diagnosis not present

## 2016-05-22 DIAGNOSIS — M1712 Unilateral primary osteoarthritis, left knee: Secondary | ICD-10-CM | POA: Diagnosis not present

## 2016-05-31 DIAGNOSIS — M1712 Unilateral primary osteoarthritis, left knee: Secondary | ICD-10-CM | POA: Diagnosis not present

## 2016-06-04 DIAGNOSIS — M1712 Unilateral primary osteoarthritis, left knee: Secondary | ICD-10-CM | POA: Diagnosis not present

## 2016-06-11 ENCOUNTER — Ambulatory Visit: Payer: Medicare Other | Admitting: Pulmonary Disease

## 2016-07-17 NOTE — Progress Notes (Signed)
Patient ID: Heather Benson, female   DOB: 06/04/1931, 83 y.o.   MRN: [id number]     Cardiology Office Note   Date:  07/18/2016   ID:  Heather Benson, DOB Jun 20, 1931, MRN [id number]  PCP:  Vidal Schwalbe, MD    No chief complaint on file.  F/u PAF  Wt Readings from Last 3 Encounters:  07/18/16 194 lb 3.2 oz (88.1 kg)  03/26/16 209 lb (94.8 kg)  03/06/16 207 lb 12.8 oz (94.3 kg)       History of Present Illness: Heather Benson is a 83 y.o. female  with a hx of paroxysmal atrial fibrillation, HTN, LE edema, GERD. She was seen in February 2016 and was back in atrial fibrillation. She was placed on amiodarone and eventually underwent cardioversion 08/05/14. She was seen in follow-up on 08/23/14. She felt no different and continued to have dizziness as well as intermittent palpitations. Dizziness was worse with standing up. She denied syncope. She was noted to be bradycardic and her bisoprolol was decreased.   In 5/16, she Continued to c/o dizziness and was bradycardic. Her beta-blocker was stopped. FU ECG with improved HR.   Her heart rate improved. However, she remained dizzy. She was seen by outpatient neuro physical therapy as well as neurology. Testing for BPPV was negative. She did have orthostatic hypotension documented at least once. Most of her dizziness seems to occur while she standing. However, she does describe a spinning sensation at times.    She denies orthopnea or PND. She has chronic edema. This is unchanged. She denies syncope or near-syncope.  Overall, the dizziness and SHOB is better today.  Her biggest complaint is the cost of her Eliquis later in the year.  No bleeding issues.  She is back on Lasix for the leg swelling.    Very rarely, she feels palpitations at night.   She has been hospitalized for PNA in 8/17.  She was there for 2 day and was quite sick.  She has persistent SHOB.  She does not walk much. They are not members of a gym but she could walk  at home.  THey have exercise equipment at the church as well.   Edema in the ankles persists.  Diuretic decreased due to kidney function.    Renal dysfunction has been an issue.  SHe does not want to do dialysis if it was ever needed.    BP at home is in the 357S systolic typically.    She still has dizziness on occasion.  Her balance is off but she has not fallen.  She feels that her head is "swimmy."  She has a persistent cough.      Past Medical History:  Diagnosis Date  . Aortic stenosis, mild 12/14/2015  . Arthritis 08-28-11   right knee pain-surgery planned  . Cancer [facility]) 08-28-11   Melonoma-cheek(many Yrs ago) , recent bx. left  face lesion, past skin cancer lesions  . CAP (community acquired pneumonia) 12/13/2015  . CHF (congestive heart failure) ([place])   . Complication of anesthesia 08-28-11   in past arrythmia-  cardiology has seen in past  . Dysrhythmia 08-28-11   only immed.  to several days after surgery- hx. A.fib. in past  . GERD (gastroesophageal reflux disease) 08-28-11   tx. omeprazole  . Hypertension 08-28-11   tx. meds  . IBS (irritable bowel syndrome)   . Neuromuscular disorder ([place]) 08-28-11   hx. Polio age 58-slight residual affects legs  . Pneumonia  12/2015   hospital x 3 days  . PONV (postoperative nausea and vomiting)   . Swelling of extremity 08-28-11   bilateral lower extremities- mid calf down    Past Surgical History:  Procedure Laterality Date  . ABDOMINAL HYSTERECTOMY  08-28-11   Partial-vaginal approach  . BREAST REDUCTION SURGERY  1997  . BUNIONECTOMY  08-28-11   right  . CARDIOVERSION N/A 08/05/2014   Procedure: CARDIOVERSION;  Surgeon: Larey Dresser, MD;  Location: [place];  Service: Cardiovascular;  Laterality: N/A;  . CATARACT EXTRACTION, BILATERAL  08-28-11   bilateral  . EYE SURGERY     Cataract  . JOINT REPLACEMENT  Oct. 1, 2012   Right Knee  . KNEE ARTHROSCOPY  09/06/2011   Procedure: ARTHROSCOPY KNEE;  Surgeon: Gearlean Alf, MD;  Location: [facility];  Service: Orthopedics;  Laterality: Right;  with Synovectomy     Current Outpatient Prescriptions  Medication Sig Dispense Refill  . acetaminophen (TYLENOL) 500 MG tablet Take 1,000 mg by mouth every 8 (eight) hours as needed for moderate pain (pain in legs).    [name] amiodarone (PACERONE) 200 MG tablet TAKE 1 TABLET (200 MG TOTAL) BY MOUTH DAILY. 90 tablet 3  . amLODipine (NORVASC) 2.5 MG tablet Take 1 tablet (2.5 mg total) by mouth daily. 90 tablet 0  . apixaban (ELIQUIS) 2.5 MG TABS tablet TAKE 1 TABLET (2.5 MG TOTAL) BY MOUTH 2 (TWO) TIMES DAILY. 60 tablet 6  . Azelastine-Fluticasone (DYMISTA) 137-50 MCG/ACT SUSP Place 1 spray into the nose 2 (two) times daily. 1 Bottle 0  . Azelastine-Fluticasone 137-50 MCG/ACT SUSP Place 1 spray into the nose 2 (two) times daily. 23 g 5  . beclomethasone (QVAR) 80 MCG/ACT inhaler Inhale 2 puffs into the lungs 2 (two) times daily.    . chlorpheniramine (CHLORPHEN) 4 MG tablet Take 2 tablets (8 mg total) by mouth 3 (three) times daily. 90 tablet 1  . Cholecalciferol (VITAMIN D3) 2000 UNITS capsule Take 2,000 Units by mouth daily.    . fexofenadine (ALLEGRA) 180 MG tablet Take 180 mg by mouth daily.    . fluticasone (FLONASE) 50 MCG/ACT nasal spray Place 1 spray into both nostrils daily as needed for allergies.     . furosemide (LASIX) 40 MG tablet Take 20 mg by mouth daily.    [name] guaiFENesin (MUCINEX) 600 MG 12 hr tablet Take 600 mg by mouth 2 (two) times daily.    [name] latanoprost (XALATAN) 0.005 % ophthalmic solution Place 1 drop into both eyes at bedtime.  3  . meclizine (ANTIVERT) 25 MG tablet Take 12.5 mg by mouth 3 (three) times daily as needed for dizziness.   0  . OVER THE COUNTER MEDICATION Take 1 tablet by mouth daily. Move Free Ultra    . pantoprazole (PROTONIX) 40 MG tablet Take 40 mg by mouth daily.    [name] Respiratory Therapy Supplies (FLUTTER) DEVI Use as directed. 1 each 0  . tetrahydrozoline 0.05 % ophthalmic solution  Place 1 drop into both eyes 2 (two) times daily as needed (for allergy eyes).      No current facility-administered medications for this visit.     Allergies:   Augmentin [amoxicillin-pot clavulanate]; Boniva [ibandronic acid]; Hydromorphone; Latex; Lisinopril; Codeine; and Tape    Social History:  The patient  reports that she has never smoked. She has never used smokeless tobacco. She reports that she does not drink alcohol or use drugs.   Family History:  The patient's family history includes  Breast cancer in her sister; Colon cancer in her mother; Emphysema in her father; Heart attack in her brother; Heart disease in her father and mother; Hyperlipidemia in her mother; Hypertension in her father and mother; Stroke in her maternal grandmother.    ROS:  Please see the history of present illness.   Otherwise, review of systems are positive for leg swelling, better with compression stocking.   All other systems are reviewed and negative.    PHYSICAL EXAM: VS:  BP (!) 146/82   Pulse 73   Ht 5\' 6"  (1.676 m)   Wt 194 lb 3.2 oz (88.1 kg)   BMI 31.34 kg/m  , BMI Body mass index is 31.34 kg/m. GEN: Well nourished, well developed, in no acute distress , intermittent productive cough HEENT: normal  Neck: no JVD, carotid bruits, or masses Cardiac: RRR; no murmurs, rubs, or gallops,no edema  Respiratory:  clear to auscultation bilaterally, normal work of breathing GI: soft, nontender, nondistended, + BS MS: no deformity or atrophy  Skin: warm and dry, no rash Neuro:  Strength and sensation are intact Psych: euthymic mood, full affect    Recent Labs: 11/13/2015: TSH 1.400 12/13/2015: B Natriuretic Peptide 211.0; Magnesium 2.2 12/15/2015: ALT 22; BUN 25; Creatinine, Ser 1.92; Hemoglobin 10.6; Platelets 265; Potassium 3.9; Sodium 135   Lipid Panel    Component Value Date/Time   CHOL  05/27/2010 0505    168        ATP III CLASSIFICATION:  <200     mg/dL   Desirable  200-239  mg/dL    Borderline High  >=240    mg/dL   High          TRIG 95 05/27/2010 0505   HDL 51 05/27/2010 0505   CHOLHDL 3.3 05/27/2010 0505   VLDL 19 05/27/2010 0505   LDLCALC  05/27/2010 0505    98        Total Cholesterol/HDL:CHD Risk Coronary Heart Disease Risk Table                     Men   Women  1/2 Average Risk   3.4   3.3  Average Risk       5.0   4.4  2 X Average Risk   9.6   7.1  3 X Average Risk  23.4   11.0        Use the calculated Patient Ratio above and the CHD Risk Table to determine the patient's CHD Risk.        ATP III CLASSIFICATION (LDL):  <100     mg/dL   Optimal  100-129  mg/dL   Near or Above                    Optimal  130-159  mg/dL   Borderline  160-189  mg/dL   High  >190     mg/dL   Very High     Other studies Reviewed: Additional studies/ records that were reviewed today with results demonstrating: Creatinine was as high as 2.03 but has decreased.  Last chest xray in 8/17 was normal.    ASSESSMENT AND PLAN:  1. AFib:  Maintaining NSR on Amiodarone. She is tolerating Eliquis. She remains on the low dose. Most Recent TSH and LFTs ok. She needs LFTs and TSH every 6 months along with periodic chest xray.   Follwed with PMD.  Will have to get those records.  We'll contact Dr. Orest Dikes office.  We'll have to adjust Eliquis dose based on her renal function. 2. SHOB: Likely multifactorial with some deconditioning involved. Her activity is limited. 3. Chronic kidney disease: Last creatinine was increased. She is already on the decreased dose of Eliquis. I asked her to drink plenty of water during the day to avoid dehydration.  Followed by Nephrology, Dr. Lorrene Reid.  Cr 1.92 At last check in our system. I explained to her also that she should avoid NSAIDs.  4. Lower extremity edema: She will resume wearing compression stockings out of the weather is getting cooler. I do not want to give her additional diuretic medication given her issues with dizziness which have been  difficult to treat. 5. HTN:  Well controlled.   Current medicines are reviewed at length with the patient today.  The patient concerns regarding her medicines were addressed.  The following changes have been made:  No change  Labs/ tests ordered today include:   No orders of the defined types were placed in this encounter.   Recommend 150 minutes/week of aerobic exercise Low fat, low carb, high fiber diet recommended  Disposition:   FU in 12 months   Signed, Larae Grooms, MD  07/18/2016 11:14 AM    [facility] [address], [place], [place]  [address] Phone: [phone]; Fax: [phone]

## 2016-07-18 ENCOUNTER — Encounter (INDEPENDENT_AMBULATORY_CARE_PROVIDER_SITE_OTHER): Payer: Self-pay

## 2016-07-18 ENCOUNTER — Ambulatory Visit (INDEPENDENT_AMBULATORY_CARE_PROVIDER_SITE_OTHER): Payer: Medicare Other | Admitting: Interventional Cardiology

## 2016-07-18 ENCOUNTER — Encounter: Payer: Self-pay | Admitting: Interventional Cardiology

## 2016-07-18 VITALS — BP 146/82 | HR 73 | Ht 66.0 in | Wt 194.2 lb

## 2016-07-18 DIAGNOSIS — R42 Dizziness and giddiness: Secondary | ICD-10-CM | POA: Diagnosis not present

## 2016-07-18 DIAGNOSIS — I10 Essential (primary) hypertension: Secondary | ICD-10-CM

## 2016-07-18 DIAGNOSIS — N184 Chronic kidney disease, stage 4 (severe): Secondary | ICD-10-CM

## 2016-07-18 DIAGNOSIS — Z6838 Body mass index (BMI) 38.0-38.9, adult: Secondary | ICD-10-CM | POA: Diagnosis not present

## 2016-07-18 DIAGNOSIS — I480 Paroxysmal atrial fibrillation: Secondary | ICD-10-CM

## 2016-07-18 DIAGNOSIS — N2581 Secondary hyperparathyroidism of renal origin: Secondary | ICD-10-CM | POA: Diagnosis not present

## 2016-07-18 DIAGNOSIS — R600 Localized edema: Secondary | ICD-10-CM

## 2016-07-18 NOTE — Patient Instructions (Signed)
Medication Instructions:  Your physician recommends that you continue on your current medications as directed. Please refer to the Current Medication list given to you today.   Labwork: None Ordered  Testing/Procedures: None Ordered  Follow-Up: Your physician wants you to follow-up in: 1 year with Dr. Irish Lack. You will receive a reminder letter in the mail two months in advance. If you don't receive a letter, please call our office to schedule the follow-up appointment.   Any Other Special Instructions Will Be Listed Below (If Applicable).     If you need a refill on your cardiac medications before your next appointment, please call your pharmacy.

## 2016-07-19 DIAGNOSIS — H04123 Dry eye syndrome of bilateral lacrimal glands: Secondary | ICD-10-CM | POA: Diagnosis not present

## 2016-08-05 DIAGNOSIS — R05 Cough: Secondary | ICD-10-CM | POA: Diagnosis not present

## 2016-08-07 ENCOUNTER — Telehealth: Payer: Self-pay | Admitting: *Deleted

## 2016-08-07 NOTE — Telephone Encounter (Signed)
Spoke with patient and advised her FU needs to be rescheduled; dr on vacation. Patient stated she is seeing her kidney dr tomorrow. She stated "I have a lot going on." She requested to call back and reschedule FU after seeing kidney dr. She verbalized understanding of call.

## 2016-08-08 DIAGNOSIS — I480 Paroxysmal atrial fibrillation: Secondary | ICD-10-CM | POA: Diagnosis not present

## 2016-08-08 DIAGNOSIS — R42 Dizziness and giddiness: Secondary | ICD-10-CM | POA: Diagnosis not present

## 2016-08-08 DIAGNOSIS — Z6838 Body mass index (BMI) 38.0-38.9, adult: Secondary | ICD-10-CM | POA: Diagnosis not present

## 2016-08-08 DIAGNOSIS — N184 Chronic kidney disease, stage 4 (severe): Secondary | ICD-10-CM | POA: Diagnosis not present

## 2016-08-08 DIAGNOSIS — I10 Essential (primary) hypertension: Secondary | ICD-10-CM | POA: Diagnosis not present

## 2016-08-08 DIAGNOSIS — N2581 Secondary hyperparathyroidism of renal origin: Secondary | ICD-10-CM | POA: Diagnosis not present

## 2016-09-05 DIAGNOSIS — H401211 Low-tension glaucoma, right eye, mild stage: Secondary | ICD-10-CM | POA: Diagnosis not present

## 2016-09-05 DIAGNOSIS — Z961 Presence of intraocular lens: Secondary | ICD-10-CM | POA: Diagnosis not present

## 2016-09-09 DIAGNOSIS — Z6838 Body mass index (BMI) 38.0-38.9, adult: Secondary | ICD-10-CM | POA: Diagnosis not present

## 2016-09-09 DIAGNOSIS — I10 Essential (primary) hypertension: Secondary | ICD-10-CM | POA: Diagnosis not present

## 2016-09-09 DIAGNOSIS — R42 Dizziness and giddiness: Secondary | ICD-10-CM | POA: Diagnosis not present

## 2016-09-09 DIAGNOSIS — I480 Paroxysmal atrial fibrillation: Secondary | ICD-10-CM | POA: Diagnosis not present

## 2016-09-09 DIAGNOSIS — N184 Chronic kidney disease, stage 4 (severe): Secondary | ICD-10-CM | POA: Diagnosis not present

## 2016-09-09 DIAGNOSIS — N2581 Secondary hyperparathyroidism of renal origin: Secondary | ICD-10-CM | POA: Diagnosis not present

## 2016-09-24 ENCOUNTER — Ambulatory Visit: Payer: Medicare Other | Admitting: Diagnostic Neuroimaging

## 2016-09-24 DIAGNOSIS — D485 Neoplasm of uncertain behavior of skin: Secondary | ICD-10-CM | POA: Diagnosis not present

## 2016-09-24 DIAGNOSIS — Z85820 Personal history of malignant melanoma of skin: Secondary | ICD-10-CM | POA: Diagnosis not present

## 2016-09-24 DIAGNOSIS — C44321 Squamous cell carcinoma of skin of nose: Secondary | ICD-10-CM | POA: Diagnosis not present

## 2016-09-24 DIAGNOSIS — Z85828 Personal history of other malignant neoplasm of skin: Secondary | ICD-10-CM | POA: Diagnosis not present

## 2016-09-24 DIAGNOSIS — L817 Pigmented purpuric dermatosis: Secondary | ICD-10-CM | POA: Diagnosis not present

## 2016-09-24 DIAGNOSIS — L570 Actinic keratosis: Secondary | ICD-10-CM | POA: Diagnosis not present

## 2016-10-16 DIAGNOSIS — N184 Chronic kidney disease, stage 4 (severe): Secondary | ICD-10-CM | POA: Diagnosis not present

## 2016-11-04 DIAGNOSIS — R42 Dizziness and giddiness: Secondary | ICD-10-CM | POA: Diagnosis not present

## 2016-11-04 DIAGNOSIS — N184 Chronic kidney disease, stage 4 (severe): Secondary | ICD-10-CM | POA: Diagnosis not present

## 2016-11-04 DIAGNOSIS — I480 Paroxysmal atrial fibrillation: Secondary | ICD-10-CM | POA: Diagnosis not present

## 2016-11-04 DIAGNOSIS — E876 Hypokalemia: Secondary | ICD-10-CM | POA: Diagnosis not present

## 2016-11-04 DIAGNOSIS — Z6837 Body mass index (BMI) 37.0-37.9, adult: Secondary | ICD-10-CM | POA: Diagnosis not present

## 2016-11-04 DIAGNOSIS — I10 Essential (primary) hypertension: Secondary | ICD-10-CM | POA: Diagnosis not present

## 2016-11-24 ENCOUNTER — Other Ambulatory Visit: Payer: Self-pay | Admitting: Cardiology

## 2016-12-11 ENCOUNTER — Encounter (HOSPITAL_COMMUNITY): Payer: Self-pay

## 2016-12-11 ENCOUNTER — Emergency Department (HOSPITAL_COMMUNITY)
Admission: EM | Admit: 2016-12-11 | Discharge: 2016-12-11 | Disposition: A | Discharge status: Home / Self Care | Payer: Medicare Other | Attending: Emergency Medicine | Admitting: Emergency Medicine

## 2016-12-11 DIAGNOSIS — I5032 Chronic diastolic (congestive) heart failure: Secondary | ICD-10-CM | POA: Insufficient documentation

## 2016-12-11 DIAGNOSIS — I130 Hypertensive heart and chronic kidney disease with heart failure and stage 1 through stage 4 chronic kidney disease, or unspecified chronic kidney disease: Secondary | ICD-10-CM | POA: Diagnosis not present

## 2016-12-11 DIAGNOSIS — Z96651 Presence of right artificial knee joint: Secondary | ICD-10-CM | POA: Insufficient documentation

## 2016-12-11 DIAGNOSIS — R42 Dizziness and giddiness: Secondary | ICD-10-CM | POA: Diagnosis not present

## 2016-12-11 DIAGNOSIS — Z7901 Long term (current) use of anticoagulants: Secondary | ICD-10-CM | POA: Insufficient documentation

## 2016-12-11 DIAGNOSIS — E860 Dehydration: Secondary | ICD-10-CM | POA: Diagnosis not present

## 2016-12-11 DIAGNOSIS — Z79899 Other long term (current) drug therapy: Secondary | ICD-10-CM | POA: Diagnosis not present

## 2016-12-11 DIAGNOSIS — R197 Diarrhea, unspecified: Secondary | ICD-10-CM

## 2016-12-11 DIAGNOSIS — Z91040 Latex allergy status: Secondary | ICD-10-CM | POA: Diagnosis not present

## 2016-12-11 DIAGNOSIS — R110 Nausea: Secondary | ICD-10-CM | POA: Diagnosis not present

## 2016-12-11 DIAGNOSIS — N184 Chronic kidney disease, stage 4 (severe): Secondary | ICD-10-CM | POA: Diagnosis not present

## 2016-12-11 DIAGNOSIS — R404 Transient alteration of awareness: Secondary | ICD-10-CM | POA: Diagnosis not present

## 2016-12-11 LAB — CBC WITH DIFFERENTIAL/PLATELET
BASOS ABS: 0 10*3/uL (ref 0.0–0.1)
BASOS PCT: 0 %
EOS ABS: 0 10*3/uL (ref 0.0–0.7)
Eosinophils Relative: 0 %
HEMATOCRIT: 40.8 % (ref 36.0–46.0)
Hemoglobin: 13 g/dL (ref 12.0–15.0)
Lymphocytes Relative: 15 %
Lymphs Abs: 2.3 10*3/uL (ref 0.7–4.0)
MCH: 27.4 pg (ref 26.0–34.0)
MCHC: 31.9 g/dL (ref 30.0–36.0)
MCV: 86.1 fL (ref 78.0–100.0)
MONO ABS: 0.9 10*3/uL (ref 0.1–1.0)
Monocytes Relative: 6 %
NEUTROS ABS: 12.2 10*3/uL — AB (ref 1.7–7.7)
Neutrophils Relative %: 79 %
PLATELETS: 136 10*3/uL — AB (ref 150–400)
RBC: 4.74 MIL/uL (ref 3.87–5.11)
RDW: 19.4 % — AB (ref 11.5–15.5)
WBC: 15.4 10*3/uL — ABNORMAL HIGH (ref 4.0–10.5)

## 2016-12-11 LAB — I-STAT CHEM 8, ED
BUN: 31 mg/dL — AB (ref 6–20)
CHLORIDE: 101 mmol/L (ref 101–111)
CREATININE: 2.5 mg/dL — AB (ref 0.44–1.00)
Calcium, Ion: 1.13 mmol/L — ABNORMAL LOW (ref 1.15–1.40)
Glucose, Bld: 99 mg/dL (ref 65–99)
HEMATOCRIT: 40 % (ref 36.0–46.0)
Hemoglobin: 13.6 g/dL (ref 12.0–15.0)
POTASSIUM: 4.3 mmol/L (ref 3.5–5.1)
Sodium: 136 mmol/L (ref 135–145)
TCO2: 26 mmol/L (ref 0–100)

## 2016-12-11 LAB — COMPREHENSIVE METABOLIC PANEL
ALBUMIN: 2.8 g/dL — AB (ref 3.5–5.0)
ALT: 16 U/L (ref 14–54)
AST: 18 U/L (ref 15–41)
Alkaline Phosphatase: 37 U/L — ABNORMAL LOW (ref 38–126)
Anion gap: 9 (ref 5–15)
BILIRUBIN TOTAL: 1.2 mg/dL (ref 0.3–1.2)
BUN: 30 mg/dL — AB (ref 6–20)
CHLORIDE: 103 mmol/L (ref 101–111)
CO2: 25 mmol/L (ref 22–32)
Calcium: 8.4 mg/dL — ABNORMAL LOW (ref 8.9–10.3)
Creatinine, Ser: 2.34 mg/dL — ABNORMAL HIGH (ref 0.44–1.00)
GFR calc Af Amer: 21 mL/min — ABNORMAL LOW (ref >60)
GFR calc non Af Amer: 18 mL/min — ABNORMAL LOW (ref >60)
GLUCOSE: 102 mg/dL — AB (ref 65–99)
POTASSIUM: 4.2 mmol/L (ref 3.5–5.1)
SODIUM: 137 mmol/L (ref 135–145)
Total Protein: 5.8 g/dL — ABNORMAL LOW (ref 6.5–8.1)

## 2016-12-11 LAB — LIPASE, BLOOD: Lipase: 27 U/L (ref 11–51)

## 2016-12-11 LAB — MAGNESIUM: MAGNESIUM: 1.9 mg/dL (ref 1.7–2.4)

## 2016-12-11 MED ORDER — ONDANSETRON 4 MG PO TBDP: ORAL_TABLET | ORAL | 0 refills | Status: DC

## 2016-12-11 MED ORDER — LOPERAMIDE HCL 2 MG PO CAPS
2.0000 mg | ORAL_CAPSULE | ORAL | Status: DC | PRN
  Administered 2016-12-11: 2 mg via ORAL
  Filled 2016-12-11: qty 1

## 2016-12-11 MED ORDER — SODIUM CHLORIDE 0.9 % IV BOLUS (SEPSIS)
1000.0000 mL | Freq: Once | INTRAVENOUS | Status: AC
  Administered 2016-12-11: 1000 mL via INTRAVENOUS

## 2016-12-11 NOTE — ED Notes (Signed)
Patient provided with graham crackers and water for PO challenge. Will re-evaluate shortly.

## 2016-12-11 NOTE — ED Provider Notes (Signed)
[facility] DEPT Provider Note   CSN: [id number] Arrival date & time: 12/11/16  1228     History   Chief Complaint Chief Complaint  Patient presents with  . Diarrhea  . Hypotension    HPI Heather Benson is a 83 y.o. female.  83 yo F with a chief complaint of diarrhea. Going on for the past 3 days. Multiple loops episodes. Denies recent antibiotic use has had some low-grade fevers at home. Denies abdominal pain or vomiting. Denies chest pain or shortness of breath. Denies lower extremity edema. Denies suspicious food intake.  She has tried Imodium 1. She stated did not work.   The history is provided by the patient.  Diarrhea   This is a new problem. The current episode started 2 days ago. The problem occurs 5 to 10 times per day. The problem has not changed since onset.The stool consistency is described as watery. The maximum temperature recorded prior to her arrival was 100 to 100.9 F. The fever has been present for less than 1 day. Pertinent negatives include no vomiting, no chills, no headaches, no arthralgias and no myalgias. She has tried anti-motility drugs for the symptoms. The treatment provided no relief.    Past Medical History:  Diagnosis Date  . Aortic stenosis, mild 12/14/2015  . Arthritis 08-28-11   right knee pain-surgery planned  . Cancer [facility]) 08-28-11   Melonoma-cheek(many Yrs ago) , recent bx. left  face lesion, past skin cancer lesions  . CAP (community acquired pneumonia) 12/13/2015  . CHF (congestive heart failure) ([place])   . Complication of anesthesia 08-28-11   in past arrythmia-  cardiology has seen in past  . Dysrhythmia 08-28-11   only immed.  to several days after surgery- hx. A.fib. in past  . GERD (gastroesophageal reflux disease) 08-28-11   tx. omeprazole  . Hypertension 08-28-11   tx. meds  . IBS (irritable bowel syndrome)   . Neuromuscular disorder ([place]) 08-28-11   hx. Polio age 60-slight residual affects legs  . Pneumonia 12/2015   hospital x 3 days  . PONV (postoperative nausea and vomiting)   . Swelling of extremity 08-28-11   bilateral lower extremities- mid calf down    Patient Active Problem List   Diagnosis Date Noted  . Acute diastolic CHF (congestive heart failure) ([place])   . CAP (community acquired pneumonia) 12/13/2015  . Elevated troponin 12/13/2015  . Anemia in chronic renal disease 12/13/2015  . Hyponatremia 12/13/2015  . DOE (dyspnea on exertion) 03/06/2015  . Bradycardia 08/23/2014  . Essential hypertension 08/23/2014  . Lightheadedness 08/23/2014  . Fatigue 06/22/2014  . PAF (paroxysmal atrial fibrillation) ([place]) 01/27/2014  . CKD (chronic kidney disease), stage IV ([place]) 01/27/2014  . Swelling of limb 04/01/2012  . Edema leg 04/01/2012    Past Surgical History:  Procedure Laterality Date  . ABDOMINAL HYSTERECTOMY  08-28-11   Partial-vaginal approach  . BREAST REDUCTION SURGERY  1997  . BUNIONECTOMY  08-28-11   right  . CARDIOVERSION N/A 08/05/2014   Procedure: CARDIOVERSION;  Surgeon: Larey Dresser, MD;  Location: [place];  Service: Cardiovascular;  Laterality: N/A;  . CATARACT EXTRACTION, BILATERAL  08-28-11   bilateral  . EYE SURGERY     Cataract  . JOINT REPLACEMENT  Oct. 1, 2012   Right Knee  . KNEE ARTHROSCOPY  09/06/2011   Procedure: ARTHROSCOPY KNEE;  Surgeon: Gearlean Alf, MD;  Location[facility];  Service: Orthopedics;  Laterality: Right;  with Synovectomy    OB  History    No data available       Home Medications    Prior to Admission medications   Medication Sig Start Date End Date Taking? Authorizing Provider  acetaminophen (TYLENOL) 500 MG tablet Take 1,000 mg by mouth every 8 (eight) hours as needed for moderate pain (pain in legs).    [provider]  amiodarone (PACERONE) 200 MG tablet TAKE 1 TABLET (200 MG TOTAL) BY MOUTH DAILY. 10/31/15   Jettie Booze, MD  amLODipine (NORVASC) 2.5 MG tablet Take 1 tablet (2.5 mg total) by mouth daily.  02/03/15   Jettie Booze, MD  Azelastine-Fluticasone [facility]) 137-50 MCG/ACT SUSP Place 1 spray into the nose 2 (two) times daily. 02/06/16 07/18/16  Marshell Garfinkel, MD  Azelastine-Fluticasone 137-50 MCG/ACT SUSP Place 1 spray into the nose 2 (two) times daily. 02/06/16   Mannam, Hart Robinsons, MD  beclomethasone (QVAR) 80 MCG/ACT inhaler Inhale 2 puffs into the lungs 2 (two) times daily.    [provider]  chlorpheniramine (CHLORPHEN) 4 MG tablet Take 2 tablets (8 mg total) by mouth 3 (three) times daily. 03/06/16   Mannam, Hart Robinsons, MD  Cholecalciferol (VITAMIN D3) 2000 UNITS capsule Take 2,000 Units by mouth daily.    [provider]  ELIQUIS 2.5 MG TABS tablet TAKE 1 TABLET BY MOUTH TWICE A DAY 11/26/16   Jettie Booze, MD  fexofenadine (ALLEGRA) 180 MG tablet Take 180 mg by mouth daily.    [provider]  fluticasone (FLONASE) 50 MCG/ACT nasal spray Place 1 spray into both nostrils daily as needed for allergies.  09/27/13   [provider]  furosemide (LASIX) 40 MG tablet Take 20 mg by mouth daily. 12/09/15   [provider]  guaiFENesin (MUCINEX) 600 MG 12 hr tablet Take 600 mg by mouth 2 (two) times daily.    [provider]  latanoprost (XALATAN) 0.005 % ophthalmic solution Place 1 drop into both eyes at bedtime. 08/11/14   [provider]  meclizine (ANTIVERT) 25 MG tablet Take 12.5 mg by mouth 3 (three) times daily as needed for dizziness.  04/01/14   [provider]  ondansetron (ZOFRAN ODT) 4 MG disintegrating tablet 4mg  ODT q4 hours prn nausea/vomit 12/11/16   Deno Etienne, DO  OVER THE COUNTER MEDICATION Take 1 tablet by mouth daily. Move Free Ultra    [provider]  pantoprazole (PROTONIX) 40 MG tablet Take 40 mg by mouth daily. 11/01/15   [provider]  Respiratory Therapy Supplies (FLUTTER) DEVI Use as directed. 02/06/16   Mannam, Hart Robinsons, MD  tetrahydrozoline 0.05 % ophthalmic solution Place 1  drop into both eyes 2 (two) times daily as needed (for allergy eyes).     [provider]    Family History Family History  Problem Relation Age of Onset  . Heart disease Mother        Varicose Veins  . Hyperlipidemia Mother   . Hypertension Mother   . Colon cancer Mother   . Heart disease Father        Heart Disease before age 17  . Hypertension Father   . Emphysema Father   . Breast cancer Sister   . Stroke Maternal Grandmother   . Heart attack Brother     Social History Social History  Substance Use Topics  . Smoking status: Never Smoker  . Smokeless tobacco: Never Used  . Alcohol use No     Allergies   Augmentin [amoxicillin-pot clavulanate]; Boniva [ibandronic acid]; Hydromorphone; Latex;  Lisinopril; Codeine; and Tape   Review of Systems Review of Systems  Constitutional: Positive for fever. Negative for chills.  HENT: Negative for congestion and rhinorrhea.   Eyes: Negative for redness and visual disturbance.  Respiratory: Negative for shortness of breath and wheezing.   Cardiovascular: Negative for chest pain and palpitations.  Gastrointestinal: Positive for diarrhea. Negative for nausea and vomiting.  Genitourinary: Negative for dysuria and urgency.  Musculoskeletal: Negative for arthralgias and myalgias.  Skin: Negative for pallor and wound.  Neurological: Positive for weakness. Negative for dizziness and headaches.     Physical Exam Updated Vital Signs BP 139/68   Pulse 79   Temp 99.8 F (37.7 C) (Oral)   Resp 18   Wt 88.1 kg (194 lb 3.6 oz)   SpO2 97%   BMI 31.35 kg/m   Physical Exam  Constitutional: She is oriented to person, place, and time. She appears well-developed and well-nourished. No distress.  HENT:  Head: Normocephalic and atraumatic.  Eyes: Pupils are equal, round, and reactive to light. EOM are normal.  Neck: Normal range of motion. Neck supple.  Cardiovascular: Normal rate and regular rhythm.  Exam reveals no gallop  and no friction rub.   No murmur heard. Pulmonary/Chest: Effort normal. She has no wheezes. She has no rales.  Abdominal: Soft. She exhibits no distension and no mass. There is no tenderness. There is no guarding.  Musculoskeletal: She exhibits no edema or tenderness.  Neurological: She is alert and oriented to person, place, and time.  Skin: Skin is warm and dry. She is not diaphoretic.  Psychiatric: She has a normal mood and affect. Her behavior is normal.  Nursing note and vitals reviewed.    ED Treatments / Results  Labs (all labs ordered are listed, but only abnormal results are displayed) Labs Reviewed  CBC WITH DIFFERENTIAL/PLATELET - Abnormal; Notable for the following:       Result Value   WBC 15.4 (*)    RDW 19.4 (*)    Platelets 136 (*)    Neutro Abs 12.2 (*)    All other components within normal limits  COMPREHENSIVE METABOLIC PANEL - Abnormal; Notable for the following:    Glucose, Bld 102 (*)    BUN 30 (*)    Creatinine, Ser 2.34 (*)    Calcium 8.4 (*)    Total Protein 5.8 (*)    Albumin 2.8 (*)    Alkaline Phosphatase 37 (*)    GFR calc non Af Amer 18 (*)    GFR calc Af Amer 21 (*)    All other components within normal limits  I-STAT CHEM 8, ED - Abnormal; Notable for the following:    BUN 31 (*)    Creatinine, Ser 2.50 (*)    Calcium, Ion 1.13 (*)    All other components within normal limits  LIPASE, BLOOD  MAGNESIUM    EKG  EKG Interpretation None       Radiology No results found.  Procedures Procedures (including critical care time)  Medications Ordered in ED Medications  sodium chloride 0.9 % bolus 1,000 mL (0 mLs Intravenous Stopped 12/11/16 1510)     Initial Impression / Assessment and Plan / ED Course  I have reviewed the triage vital signs and the nursing notes.  Pertinent labs & imaging results that were available during my care of the patient were reviewed by me and considered in my medical decision making (see chart for  details).     83 yo F  With a chief complaint of diarrhea and fever. This appears to be going around the area in which she lives. She is well-appearing and nontoxic. Eyes no noted abdominal tenderness. Will obtain labs give fluids and Zofran oral trial recess.  Labs with mild worsening renal function.  Given fluids.  Feeling mildly better. Not hypoxic here.   Eating and drinking without difficulty.  Feeling better.  D/c home.   3:28 PM:  I have discussed the diagnosis/risks/treatment options with the patient and family and believe the pt to be eligible for discharge home to follow-up with PCP. We also discussed returning to the ED immediately if new or worsening sx occur. We discussed the sx which are most concerning (e.g., sudden worsening pain, fever, inability to tolerate by mouth) that necessitate immediate return. Medications administered to the patient during their visit and any new prescriptions provided to the patient are listed below.  Medications given during this visit Medications  sodium chloride 0.9 % bolus 1,000 mL (0 mLs Intravenous Stopped 12/11/16 1510)     The patient appears reasonably screen and/or stabilized for discharge and I doubt any other medical condition or other [facility] requiring further screening, evaluation, or treatment in the ED at this time prior to discharge.    Final Clinical Impressions(s) / ED Diagnoses   Final diagnoses:  Dehydration  Diarrhea, unspecified type    New Prescriptions New Prescriptions   ONDANSETRON (ZOFRAN ODT) 4 MG DISINTEGRATING TABLET    4mg  ODT q4 hours prn nausea/vomit     Deno Etienne, DO 12/11/16 1528

## 2016-12-11 NOTE — ED Triage Notes (Signed)
Patient BIB EMS from home with c/o diarrhea, dizziness, and nausea for the past couple of days. Patient denies pain or vomiting. Patient had positive orthostatics for EMS. Patient also was hyptensive for EMS. Patient heart rate for EMS was 100 seated, 100/70 BP- after 1LNS IV, patient HR 70 seated, and 120/71. For EMS, patient CBG= 109. Per EMS report, patient oxygen saturations dropped en route and was placed on 2LNC.

## 2016-12-11 NOTE — ED Notes (Signed)
Bed: [facility] Expected date:  Expected time:  Means of arrival:  Comments: 

## 2016-12-11 NOTE — ED Notes (Signed)
Patient experienced another bout of diarrhea. Patient cleaned up and assisted back to the room. MD notified. Immodium ordered.

## 2016-12-11 NOTE — ED Notes (Signed)
Patient had 2 episodes of watery diarrhea. Patient cleaned and placed in paper scurbs. MD notified.

## 2016-12-11 NOTE — ED Notes (Signed)
Bed: WA10 Expected date:  Expected time:  Means of arrival:  Comments: Hall B 

## 2016-12-11 NOTE — Discharge Instructions (Signed)
Take imodium for diarrhea.  Return for sudden worsening abdominal pain.  Inability to eat or drink.  Try the BRAT diet(bananas applesauce rice and toast).

## 2016-12-16 DIAGNOSIS — R42 Dizziness and giddiness: Secondary | ICD-10-CM | POA: Diagnosis not present

## 2016-12-22 ENCOUNTER — Encounter (HOSPITAL_COMMUNITY): Payer: Self-pay | Admitting: Emergency Medicine

## 2016-12-22 ENCOUNTER — Inpatient Hospital Stay (HOSPITAL_COMMUNITY)
Admission: EM | Admit: 2016-12-22 | Discharge: 2016-12-30 | DRG: 377 | Disposition: A | Discharge status: Skilled Nursing Facility | Payer: Medicare Other | Attending: Internal Medicine | Admitting: Internal Medicine

## 2016-12-22 ENCOUNTER — Inpatient Hospital Stay (HOSPITAL_COMMUNITY): Payer: Medicare Other

## 2016-12-22 DIAGNOSIS — R001 Bradycardia, unspecified: Secondary | ICD-10-CM | POA: Diagnosis present

## 2016-12-22 DIAGNOSIS — Z8249 Family history of ischemic heart disease and other diseases of the circulatory system: Secondary | ICD-10-CM | POA: Diagnosis not present

## 2016-12-22 DIAGNOSIS — K922 Gastrointestinal hemorrhage, unspecified: Secondary | ICD-10-CM | POA: Diagnosis not present

## 2016-12-22 DIAGNOSIS — I471 Supraventricular tachycardia: Secondary | ICD-10-CM | POA: Diagnosis not present

## 2016-12-22 DIAGNOSIS — Z96651 Presence of right artificial knee joint: Secondary | ICD-10-CM | POA: Diagnosis present

## 2016-12-22 DIAGNOSIS — I959 Hypotension, unspecified: Secondary | ICD-10-CM | POA: Diagnosis present

## 2016-12-22 DIAGNOSIS — M6281 Muscle weakness (generalized): Secondary | ICD-10-CM | POA: Diagnosis not present

## 2016-12-22 DIAGNOSIS — I10 Essential (primary) hypertension: Secondary | ICD-10-CM

## 2016-12-22 DIAGNOSIS — K625 Hemorrhage of anus and rectum: Secondary | ICD-10-CM | POA: Diagnosis not present

## 2016-12-22 DIAGNOSIS — N289 Disorder of kidney and ureter, unspecified: Secondary | ICD-10-CM | POA: Diagnosis not present

## 2016-12-22 DIAGNOSIS — I5032 Chronic diastolic (congestive) heart failure: Secondary | ICD-10-CM | POA: Diagnosis present

## 2016-12-22 DIAGNOSIS — D127 Benign neoplasm of rectosigmoid junction: Secondary | ICD-10-CM | POA: Diagnosis present

## 2016-12-22 DIAGNOSIS — K219 Gastro-esophageal reflux disease without esophagitis: Secondary | ICD-10-CM | POA: Diagnosis present

## 2016-12-22 DIAGNOSIS — Z66 Do not resuscitate: Secondary | ICD-10-CM | POA: Diagnosis present

## 2016-12-22 DIAGNOSIS — Z6830 Body mass index (BMI) 30.0-30.9, adult: Secondary | ICD-10-CM

## 2016-12-22 DIAGNOSIS — N184 Chronic kidney disease, stage 4 (severe): Secondary | ICD-10-CM | POA: Diagnosis present

## 2016-12-22 DIAGNOSIS — D62 Acute posthemorrhagic anemia: Secondary | ICD-10-CM | POA: Diagnosis present

## 2016-12-22 DIAGNOSIS — Z7951 Long term (current) use of inhaled steroids: Secondary | ICD-10-CM

## 2016-12-22 DIAGNOSIS — Z91048 Other nonmedicinal substance allergy status: Secondary | ICD-10-CM

## 2016-12-22 DIAGNOSIS — Z881 Allergy status to other antibiotic agents status: Secondary | ICD-10-CM

## 2016-12-22 DIAGNOSIS — I480 Paroxysmal atrial fibrillation: Secondary | ICD-10-CM | POA: Diagnosis not present

## 2016-12-22 DIAGNOSIS — E669 Obesity, unspecified: Secondary | ICD-10-CM | POA: Diagnosis present

## 2016-12-22 DIAGNOSIS — I700 Atherosclerosis of aorta: Secondary | ICD-10-CM | POA: Diagnosis not present

## 2016-12-22 DIAGNOSIS — I130 Hypertensive heart and chronic kidney disease with heart failure and stage 1 through stage 4 chronic kidney disease, or unspecified chronic kidney disease: Secondary | ICD-10-CM | POA: Diagnosis present

## 2016-12-22 DIAGNOSIS — B9689 Other specified bacterial agents as the cause of diseases classified elsewhere: Secondary | ICD-10-CM | POA: Diagnosis present

## 2016-12-22 DIAGNOSIS — K921 Melena: Secondary | ICD-10-CM | POA: Diagnosis not present

## 2016-12-22 DIAGNOSIS — R41841 Cognitive communication deficit: Secondary | ICD-10-CM | POA: Diagnosis not present

## 2016-12-22 DIAGNOSIS — Z91040 Latex allergy status: Secondary | ICD-10-CM

## 2016-12-22 DIAGNOSIS — Z7901 Long term (current) use of anticoagulants: Secondary | ICD-10-CM

## 2016-12-22 DIAGNOSIS — K5733 Diverticulitis of large intestine without perforation or abscess with bleeding: Secondary | ICD-10-CM | POA: Diagnosis not present

## 2016-12-22 DIAGNOSIS — Z888 Allergy status to other drugs, medicaments and biological substances status: Secondary | ICD-10-CM

## 2016-12-22 DIAGNOSIS — R278 Other lack of coordination: Secondary | ICD-10-CM | POA: Diagnosis not present

## 2016-12-22 DIAGNOSIS — N170 Acute kidney failure with tubular necrosis: Secondary | ICD-10-CM | POA: Diagnosis present

## 2016-12-22 DIAGNOSIS — R2681 Unsteadiness on feet: Secondary | ICD-10-CM | POA: Diagnosis not present

## 2016-12-22 DIAGNOSIS — K5730 Diverticulosis of large intestine without perforation or abscess without bleeding: Secondary | ICD-10-CM | POA: Diagnosis not present

## 2016-12-22 DIAGNOSIS — K5731 Diverticulosis of large intestine without perforation or abscess with bleeding: Secondary | ICD-10-CM | POA: Diagnosis not present

## 2016-12-22 DIAGNOSIS — R319 Hematuria, unspecified: Secondary | ICD-10-CM | POA: Diagnosis present

## 2016-12-22 DIAGNOSIS — Z885 Allergy status to narcotic agent status: Secondary | ICD-10-CM

## 2016-12-22 DIAGNOSIS — D120 Benign neoplasm of cecum: Secondary | ICD-10-CM | POA: Diagnosis present

## 2016-12-22 DIAGNOSIS — Z85828 Personal history of other malignant neoplasm of skin: Secondary | ICD-10-CM

## 2016-12-22 DIAGNOSIS — B9620 Unspecified Escherichia coli [E. coli] as the cause of diseases classified elsewhere: Secondary | ICD-10-CM | POA: Diagnosis present

## 2016-12-22 DIAGNOSIS — N390 Urinary tract infection, site not specified: Secondary | ICD-10-CM | POA: Diagnosis present

## 2016-12-22 DIAGNOSIS — K635 Polyp of colon: Secondary | ICD-10-CM | POA: Diagnosis not present

## 2016-12-22 DIAGNOSIS — Z800 Family history of malignant neoplasm of digestive organs: Secondary | ICD-10-CM

## 2016-12-22 HISTORY — DX: Atherosclerosis of aorta: I70.0

## 2016-12-22 LAB — BASIC METABOLIC PANEL
ANION GAP: 11 (ref 5–15)
BUN: 37 mg/dL — ABNORMAL HIGH (ref 6–20)
CALCIUM: 8.3 mg/dL — AB (ref 8.9–10.3)
CO2: 25 mmol/L (ref 22–32)
Chloride: 107 mmol/L (ref 101–111)
Creatinine, Ser: 2.39 mg/dL — ABNORMAL HIGH (ref 0.44–1.00)
GFR, EST AFRICAN AMERICAN: 20 mL/min — AB (ref >60)
GFR, EST NON AFRICAN AMERICAN: 18 mL/min — AB (ref >60)
Glucose, Bld: 173 mg/dL — ABNORMAL HIGH (ref 65–99)
Potassium: 4.8 mmol/L (ref 3.5–5.1)
SODIUM: 143 mmol/L (ref 135–145)

## 2016-12-22 LAB — PREPARE RBC (CROSSMATCH)

## 2016-12-22 LAB — CBC
HCT: 45.3 % (ref 36.0–46.0)
HCT: 35 % — ABNORMAL LOW (ref 36.0–46.0)
Hemoglobin: 13.7 g/dL (ref 12.0–15.0)
Hemoglobin: 10.9 g/dL — ABNORMAL LOW (ref 12.0–15.0)
MCH: 27 pg (ref 26.0–34.0)
MCH: 27.7 pg (ref 26.0–34.0)
MCHC: 30.2 g/dL (ref 30.0–36.0)
MCHC: 31.1 g/dL (ref 30.0–36.0)
MCV: 89.2 fL (ref 78.0–100.0)
MCV: 88.8 fL (ref 78.0–100.0)
PLATELETS: 349 10*3/uL (ref 150–400)
PLATELETS: 294 10*3/uL (ref 150–400)
RBC: 5.08 MIL/uL (ref 3.87–5.11)
RBC: 3.94 MIL/uL (ref 3.87–5.11)
RDW: 18.9 % — AB (ref 11.5–15.5)
RDW: 18.4 % — ABNORMAL HIGH (ref 11.5–15.5)
WBC: 18.3 10*3/uL — AB (ref 4.0–10.5)
WBC: 28.9 10*3/uL — AB (ref 4.0–10.5)

## 2016-12-22 LAB — COMPREHENSIVE METABOLIC PANEL
ALK PHOS: 47 U/L (ref 38–126)
ALT: 20 U/L (ref 14–54)
AST: 23 U/L (ref 15–41)
Albumin: 3.1 g/dL — ABNORMAL LOW (ref 3.5–5.0)
Anion gap: 14 (ref 5–15)
BILIRUBIN TOTAL: 0.7 mg/dL (ref 0.3–1.2)
BUN: 40 mg/dL — AB (ref 6–20)
CALCIUM: 9.1 mg/dL (ref 8.9–10.3)
CHLORIDE: 103 mmol/L (ref 101–111)
CO2: 25 mmol/L (ref 22–32)
CREATININE: 2.4 mg/dL — AB (ref 0.44–1.00)
GFR, EST AFRICAN AMERICAN: 20 mL/min — AB (ref >60)
GFR, EST NON AFRICAN AMERICAN: 18 mL/min — AB (ref >60)
Glucose, Bld: 152 mg/dL — ABNORMAL HIGH (ref 65–99)
Potassium: 4.6 mmol/L (ref 3.5–5.1)
Sodium: 142 mmol/L (ref 135–145)
Total Protein: 6.4 g/dL — ABNORMAL LOW (ref 6.5–8.1)

## 2016-12-22 LAB — HEMOGLOBIN AND HEMATOCRIT, BLOOD
HCT: 32.2 % — ABNORMAL LOW (ref 36.0–46.0)
HCT: 32.1 % — ABNORMAL LOW (ref 36.0–46.0)
HEMOGLOBIN: 10 g/dL — AB (ref 12.0–15.0)
Hemoglobin: 9.9 g/dL — ABNORMAL LOW (ref 12.0–15.0)

## 2016-12-22 LAB — HEMOGLOBIN: HEMOGLOBIN: 9.9 g/dL — AB (ref 12.0–15.0)

## 2016-12-22 LAB — DIFFERENTIAL
BASOS ABS: 0 10*3/uL (ref 0.0–0.1)
BASOS PCT: 0 %
EOS ABS: 0 10*3/uL (ref 0.0–0.7)
EOS PCT: 0 %
Lymphocytes Relative: 29 %
Lymphs Abs: 5.5 10*3/uL — ABNORMAL HIGH (ref 0.7–4.0)
Monocytes Absolute: 1.1 10*3/uL — ABNORMAL HIGH (ref 0.1–1.0)
Monocytes Relative: 6 %
NEUTROS PCT: 65 %
Neutro Abs: 12.3 10*3/uL — ABNORMAL HIGH (ref 1.7–7.7)

## 2016-12-22 LAB — HEPARIN LEVEL (UNFRACTIONATED): Heparin Unfractionated: >2.2 IU/mL — ABNORMAL HIGH (ref 0.30–0.70)

## 2016-12-22 LAB — ABO/RH: ABO/RH(D): A POS

## 2016-12-22 LAB — APTT: APTT: 27 s (ref 24–36)

## 2016-12-22 LAB — MRSA PCR SCREENING: MRSA BY PCR: NEGATIVE

## 2016-12-22 LAB — PROTIME-INR
INR: 1.11
Prothrombin Time: 14.3 seconds (ref 11.4–15.2)

## 2016-12-22 LAB — MAGNESIUM: MAGNESIUM: 2.3 mg/dL (ref 1.7–2.4)

## 2016-12-22 MED ORDER — SODIUM CHLORIDE 0.9 % IV SOLN
Freq: Once | INTRAVENOUS | Status: AC
  Administered 2016-12-22: 07:00:00 via INTRAVENOUS

## 2016-12-22 MED ORDER — SODIUM CHLORIDE 0.9 % IV BOLUS (SEPSIS)
500.0000 mL | Freq: Once | INTRAVENOUS | Status: AC
  Administered 2016-12-22: 500 mL via INTRAVENOUS

## 2016-12-22 MED ORDER — ONDANSETRON HCL 4 MG/2ML IJ SOLN
4.0000 mg | Freq: Four times a day (QID) | INTRAMUSCULAR | Status: DC | PRN
  Administered 2016-12-23 – 2016-12-24 (×3): 4 mg via INTRAVENOUS
  Filled 2016-12-22 (×2): qty 2

## 2016-12-22 MED ORDER — PANTOPRAZOLE SODIUM 40 MG IV SOLR: 40.0000 mg | Freq: Two times a day (BID) | INTRAVENOUS | Status: DC

## 2016-12-22 MED ORDER — ONDANSETRON HCL 4 MG PO TABS
4.0000 mg | ORAL_TABLET | Freq: Four times a day (QID) | ORAL | Status: DC | PRN
  Administered 2016-12-30: 4 mg via ORAL
  Filled 2016-12-22: qty 1

## 2016-12-22 MED ORDER — CHARCOAL ACTIVATED PO LIQD
50.0000 g | ORAL | Status: AC
  Administered 2016-12-22: 50 g via ORAL
  Filled 2016-12-22: qty 240

## 2016-12-22 MED ORDER — GUAIFENESIN ER 600 MG PO TB12
600.0000 mg | ORAL_TABLET | Freq: Two times a day (BID) | ORAL | Status: DC
  Administered 2016-12-22: 600 mg via ORAL
  Filled 2016-12-22 (×3): qty 1

## 2016-12-22 MED ORDER — SODIUM CHLORIDE 0.9 % IV SOLN
10.0000 mL/h | Freq: Once | INTRAVENOUS | Status: AC
  Administered 2016-12-22: 10 mL/h via INTRAVENOUS

## 2016-12-22 MED ORDER — SODIUM CHLORIDE 0.9 % IV BOLUS (SEPSIS)
1000.0000 mL | Freq: Once | INTRAVENOUS | Status: AC
  Administered 2016-12-22: 1000 mL via INTRAVENOUS

## 2016-12-22 MED ORDER — SODIUM CHLORIDE 0.9 % IV SOLN
8.0000 mg/h | INTRAVENOUS | Status: DC
  Administered 2016-12-22 (×2): 8 mg/h via INTRAVENOUS
  Filled 2016-12-22 (×6): qty 80

## 2016-12-22 MED ORDER — PROTHROMBIN COMPLEX CONC HUMAN 500 UNITS IV KIT
4492.0000 [IU] | PACK | Status: AC
  Administered 2016-12-22: 4492 [IU] via INTRAVENOUS
  Filled 2016-12-22: qty 180

## 2016-12-22 MED ORDER — TECHNETIUM TC 99M-LABELED RED BLOOD CELLS IV KIT
23.9000 | PACK | Freq: Once | INTRAVENOUS | Status: AC | PRN
  Administered 2016-12-22: 23.9 via INTRAVENOUS

## 2016-12-22 MED ORDER — BUDESONIDE 0.5 MG/2ML IN SUSP
0.5000 mg | Freq: Two times a day (BID) | RESPIRATORY_TRACT | Status: DC
  Administered 2016-12-22 – 2016-12-30 (×12): 0.5 mg via RESPIRATORY_TRACT
  Filled 2016-12-22 (×15): qty 2

## 2016-12-22 MED ORDER — LATANOPROST 0.005 % OP SOLN
1.0000 [drp] | Freq: Every day | OPHTHALMIC | Status: DC
  Administered 2016-12-22 – 2016-12-29 (×7): 1 [drp] via OPHTHALMIC
  Filled 2016-12-22 (×2): qty 2.5

## 2016-12-22 MED ORDER — ONDANSETRON HCL 4 MG/2ML IJ SOLN
INTRAMUSCULAR | Status: AC
  Filled 2016-12-22: qty 2

## 2016-12-22 MED ORDER — AMIODARONE HCL 200 MG PO TABS
200.0000 mg | ORAL_TABLET | Freq: Every day | ORAL | Status: DC
  Administered 2016-12-23 – 2016-12-30 (×8): 200 mg via ORAL
  Filled 2016-12-22 (×9): qty 1

## 2016-12-22 MED ORDER — SODIUM CHLORIDE 0.9 % IV SOLN
80.0000 mg | Freq: Once | INTRAVENOUS | Status: AC
  Administered 2016-12-22: 06:00:00 80 mg via INTRAVENOUS
  Filled 2016-12-22: qty 80

## 2016-12-22 MED ORDER — DEXTROSE-NACL 5-0.9 % IV SOLN
INTRAVENOUS | Status: DC
  Administered 2016-12-22 – 2016-12-24 (×4): via INTRAVENOUS

## 2016-12-22 MED ORDER — ACETAMINOPHEN 500 MG PO TABS: 1000.0000 mg | ORAL_TABLET | Freq: Three times a day (TID) | ORAL | Status: DC | PRN

## 2016-12-22 MED ORDER — PANTOPRAZOLE SODIUM 40 MG PO TBEC: 40.0000 mg | DELAYED_RELEASE_TABLET | Freq: Every day | ORAL | Status: DC

## 2016-12-22 MED ORDER — ONDANSETRON HCL 4 MG/2ML IJ SOLN
4.0000 mg | Freq: Once | INTRAMUSCULAR | Status: AC
  Administered 2016-12-22: 4 mg via INTRAVENOUS

## 2016-12-22 NOTE — Progress Notes (Signed)
Patient received from ED. ED RN at bedside. A/O x4; 2L [place]; Patient BP low (placed in trendelenburg); protonix infusing via L arm PIV. R arm PIV taped under tele lead and removed via transfer from stretcher to bed. ED RN reported patient's DNR status but no armband in place. Conversed with patient to confirm wishes and DNR armband placed. Skin intact with the exception of a right lateral forearm skin tear, cleansed and covered with thin Allevyn dressing. CHG bath given, full linen change. Large amount of serosanguineous fluid with multiple clots in various sizes expelled from rectum. Patient stated that with each movement the blood comes out. No c/o pain; only abdominal tenderness and soreness from peri care. Dr Sherral Hammers notified of pt's arrival, BP and blood output. GI consulted. Awaiting bleeding scan. Husband and son at bedside.

## 2016-12-22 NOTE — ED Provider Notes (Signed)
[facility] DEPT Provider Note   CSN: [id number] Arrival date & time: 12/22/16  0210     History   Chief Complaint Chief Complaint  Patient presents with  . Rectal Bleeding    HPI Heather Benson is a 83 y.o. female.  The history is provided by the patient.  She woke up because of bleeding from her rectum. She has passed clots. There is been no abdominal pain but there has been some mild nausea. She did also have some episodes of chest pain. She was also lightheaded while at home. She is anticoagulated on apixaban for paroxysmal atrial fibrillation. She's never had any bleeding problems previously. She did have a colonoscopy in the past and was not told of any pathology.  Past Medical History:  Diagnosis Date  . Aortic stenosis, mild 12/14/2015  . Arthritis 08-28-11   right knee pain-surgery planned  . Cancer [facility]) 08-28-11   Melonoma-cheek(many Yrs ago) , recent bx. left  face lesion, past skin cancer lesions  . CAP (community acquired pneumonia) 12/13/2015  . CHF (congestive heart failure) ([place])   . Complication of anesthesia 08-28-11   in past arrythmia-  cardiology has seen in past  . Dysrhythmia 08-28-11   only immed.  to several days after surgery- hx. A.fib. in past  . GERD (gastroesophageal reflux disease) 08-28-11   tx. omeprazole  . Hypertension 08-28-11   tx. meds  . IBS (irritable bowel syndrome)   . Neuromuscular disorder ([place]) 08-28-11   hx. Polio age 6-slight residual affects legs  . Pneumonia 12/2015   hospital x 3 days  . PONV (postoperative nausea and vomiting)   . Swelling of extremity 08-28-11   bilateral lower extremities- mid calf down    Patient Active Problem List   Diagnosis Date Noted  . Acute diastolic CHF (congestive heart failure) ([place])   . CAP (community acquired pneumonia) 12/13/2015  . Elevated troponin 12/13/2015  . Anemia in chronic renal disease 12/13/2015  . Hyponatremia 12/13/2015  . DOE (dyspnea on exertion) 03/06/2015  .  Bradycardia 08/23/2014  . Essential hypertension 08/23/2014  . Lightheadedness 08/23/2014  . Fatigue 06/22/2014  . PAF (paroxysmal atrial fibrillation) ([place]) 01/27/2014  . CKD (chronic kidney disease), stage IV ([place]) 01/27/2014  . Swelling of limb 04/01/2012  . Edema leg 04/01/2012    Past Surgical History:  Procedure Laterality Date  . ABDOMINAL HYSTERECTOMY  08-28-11   Partial-vaginal approach  . BREAST REDUCTION SURGERY  1997  . BUNIONECTOMY  08-28-11   right  . CARDIOVERSION N/A 08/05/2014   Procedure: CARDIOVERSION;  Surgeon: Larey Dresser, MD;  Location: [facility];  Service: Cardiovascular;  Laterality: N/A;  . CATARACT EXTRACTION, BILATERAL  08-28-11   bilateral  . EYE SURGERY     Cataract  . JOINT REPLACEMENT  Oct. 1, 2012   Right Knee  . KNEE ARTHROSCOPY  09/06/2011   Procedure: ARTHROSCOPY KNEE;  Surgeon: Gearlean Alf, MD;  Location: [facility];  Service: Orthopedics;  Laterality: Right;  with Synovectomy    OB History    No data available       Home Medications    Prior to Admission medications   Medication Sig Start Date End Date Taking? Authorizing Provider  acetaminophen (TYLENOL) 500 MG tablet Take 1,000 mg by mouth every 8 (eight) hours as needed for moderate pain (pain in legs).    [provider]  amiodarone (PACERONE) 200 MG tablet TAKE 1 TABLET (200 MG TOTAL) BY MOUTH DAILY. 10/31/15  Jettie Booze, MD  amLODipine (NORVASC) 2.5 MG tablet Take 1 tablet (2.5 mg total) by mouth daily. 02/03/15   Jettie Booze, MD  Azelastine-Fluticasone [facility]) 137-50 MCG/ACT SUSP Place 1 spray into the nose 2 (two) times daily. 02/06/16 07/18/16  Marshell Garfinkel, MD  Azelastine-Fluticasone 137-50 MCG/ACT SUSP Place 1 spray into the nose 2 (two) times daily. 02/06/16   Mannam, Hart Robinsons, MD  beclomethasone (QVAR) 80 MCG/ACT inhaler Inhale 2 puffs into the lungs 2 (two) times daily.    [provider]  chlorpheniramine (CHLORPHEN) 4 MG tablet Take  2 tablets (8 mg total) by mouth 3 (three) times daily. 03/06/16   Mannam, Hart Robinsons, MD  Cholecalciferol (VITAMIN D3) 2000 UNITS capsule Take 2,000 Units by mouth daily.    [provider]  ELIQUIS 2.5 MG TABS tablet TAKE 1 TABLET BY MOUTH TWICE A DAY 11/26/16   Jettie Booze, MD  fexofenadine (ALLEGRA) 180 MG tablet Take 180 mg by mouth daily.    [provider]  fluticasone (FLONASE) 50 MCG/ACT nasal spray Place 1 spray into both nostrils daily as needed for allergies.  09/27/13   [provider]  furosemide (LASIX) 40 MG tablet Take 20 mg by mouth daily. 12/09/15   [provider]  guaiFENesin (MUCINEX) 600 MG 12 hr tablet Take 600 mg by mouth 2 (two) times daily.    [provider]  latanoprost (XALATAN) 0.005 % ophthalmic solution Place 1 drop into both eyes at bedtime. 08/11/14   [provider]  meclizine (ANTIVERT) 25 MG tablet Take 12.5 mg by mouth 3 (three) times daily as needed for dizziness.  04/01/14   [provider]  ondansetron (ZOFRAN ODT) 4 MG disintegrating tablet 4mg  ODT q4 hours prn nausea/vomit 12/11/16   Deno Etienne, DO  OVER THE COUNTER MEDICATION Take 1 tablet by mouth daily. Move Free Ultra    [provider]  pantoprazole (PROTONIX) 40 MG tablet Take 40 mg by mouth daily. 11/01/15   [provider]  Respiratory Therapy Supplies (FLUTTER) DEVI Use as directed. 02/06/16   Mannam, Hart Robinsons, MD  tetrahydrozoline 0.05 % ophthalmic solution Place 1 drop into both eyes 2 (two) times daily as needed (for allergy eyes).     [provider]    Family History Family History  Problem Relation Age of Onset  . Heart disease Mother        Varicose Veins  . Hyperlipidemia Mother   . Hypertension Mother   . Colon cancer Mother   . Heart disease Father        Heart Disease before age 61  . Hypertension Father   . Emphysema Father   . Breast cancer Sister   . Stroke Maternal Grandmother   .  Heart attack Brother     Social History Social History  Substance Use Topics  . Smoking status: Never Smoker  . Smokeless tobacco: Never Used  . Alcohol use No     Allergies   Augmentin [amoxicillin-pot clavulanate]; Boniva [ibandronic acid]; Hydromorphone; Latex; Lisinopril; Codeine; and Tape   Review of Systems Review of Systems  All other systems reviewed and are negative.    Physical Exam Updated Vital Signs BP 130/81   Pulse 80   Resp (!) 21   SpO2 97%   Physical Exam  Nursing note and vitals reviewed.  83 year old female, resting comfortably and in no acute distress. Vital signs are normal. Oxygen saturation is 97%, which is normal. Head is normocephalic and  atraumatic. PERRLA, EOMI. Oropharynx is clear. Neck is nontender and supple without adenopathy or JVD. Back is nontender and there is no CVA tenderness. Lungs are clear without rales, wheezes, or rhonchi. Chest is nontender. Heart has regular rate and rhythm without murmur. Abdomen is soft, flat, with mild tenderness in the suprapubic area. There is no rebound or guarding. There are no masses or hepatosplenomegaly and peristalsis is normoactive. Rectal: Bright red blood coming from the rectum. Extremities have no cyanosis or edema, full range of motion is present. Skin is warm and dry without rash. Neurologic: Mental status is normal, cranial nerves are intact, there are no motor or sensory deficits.  ED Treatments / Results  Labs (all labs ordered are listed, but only abnormal results are displayed) Labs Reviewed  COMPREHENSIVE METABOLIC PANEL - Abnormal; Notable for the following:       Result Value   Glucose, Bld 152 (*)    BUN 40 (*)    Creatinine, Ser 2.40 (*)    Total Protein 6.4 (*)    Albumin 3.1 (*)    GFR calc non Af Amer 18 (*)    GFR calc Af Amer 20 (*)    All other components within normal limits  CBC - Abnormal; Notable for the following:    WBC 18.3 (*)    RDW 18.9 (*)    All  other components within normal limits  DIFFERENTIAL - Abnormal; Notable for the following:    Neutro Abs 12.3 (*)    Lymphs Abs 5.5 (*)    Monocytes Absolute 1.1 (*)    All other components within normal limits  PROTIME-INR  APTT  HEPARIN LEVEL (UNFRACTIONATED)  POC OCCULT BLOOD, ED  TYPE AND SCREEN  ABO/RH  PREPARE RBC (CROSSMATCH)    EKG  EKG Interpretation  Date/Time:  Sunday December 22 2016 02:37:44 EDT Ventricular Rate:  74 PR Interval:    QRS Duration: 109 QT Interval:  376 QTC Calculation: 418 R Axis:   -50 Text Interpretation:  Sinus rhythm LAD, consider left anterior fascicular block Abnormal R-wave progression, late transition Left ventricular hypertrophy When compared with ECG of 12/13/2015, Nonspecific T wave abnormality has improved Confirmed by Delora Fuel ([id number]) on 12/22/2016 2:53:17 AM       Procedures Procedures (including critical care time) CRITICAL CARE Performed by: NLGXQ,JJHER Total critical care time: 95 minutes Critical care time was exclusive of separately billable procedures and treating other patients. Critical care was necessary to treat or prevent imminent or life-threatening deterioration. Critical care was time spent personally by me on the following activities: development of treatment plan with patient and/or surrogate as well as nursing, discussions with consultants, evaluation of patient's response to treatment, examination of patient, obtaining history from patient or surrogate, ordering and performing treatments and interventions, ordering and review of laboratory studies, ordering and review of radiographic studies, pulse oximetry and re-evaluation of patient's condition.  Medications Ordered in ED Medications  amiodarone (PACERONE) tablet 200 mg (not administered)  acetaminophen (TYLENOL) tablet 1,000 mg (not administered)  beclomethasone (QVAR) 80 MCG/ACT inhaler 2 puff (not administered)  pantoprazole (PROTONIX) EC tablet 40 mg (not  administered)  guaiFENesin (MUCINEX) 12 hr tablet 600 mg (not administered)  latanoprost (XALATAN) 0.005 % ophthalmic solution 1 drop (not administered)  sodium chloride 0.9 % bolus 1,000 mL (0 mLs Intravenous Stopped 12/22/16 0400)  prothrombin complex conc human (KCENTRA) IVPB 4,492 Units (0 Units Intravenous Stopped 12/22/16 0400)  charcoal activated (NO SORBITOL) (ACTIDOSE-AQUA) suspension 50 g (50 g  Oral Given 12/22/16 0305)  ondansetron (ZOFRAN) injection 4 mg (4 mg Intravenous Given 12/22/16 0316)  0.9 %  sodium chloride infusion (10 mL/hr Intravenous New Bag/Given 12/22/16 0448)     Initial Impression / Assessment and Plan / ED Course  I have reviewed the triage vital signs and the nursing notes.  Pertinent labs & imaging results that were available during my care of the patient were reviewed by me and considered in my medical decision making (see chart for details).  Rectal bleeding in an anticoagulated patient. She was hypotensive at triage, but blood pressure has come up on arrival to her bed. She is started on fluid resuscitation. Because she has a life-threatening bleeding episode, and is anticoagulated, she is started on protocol with KCentra to reverse anticoagulation. Old records are reviewed, confirming diagnosis of paroxysmal atrial fibrillation.  She has past additional large amount of blood clots. However, hemodynamics have remained stable. Hemoglobin has not dropped, but presumably will with subsequent hemoglobin tests. Because amount of blood that she has lost, and initial hypotension, decision is made to transfuse 1 unit of blood. Renal insufficiency is present and unchanged from baseline. Case is discussed with Dr. Alcario Drought of [facility] hospitalists who agrees to admit the patient. Consultation has been placed with gastroenterology, awaiting their call.  Final Clinical Impressions(s) / ED Diagnoses   Final diagnoses:  Rectal bleeding  Anticoagulant long-term use  Renal  insufficiency    New Prescriptions New Prescriptions   No medications on file     Delora Fuel, MD 45/40/98 [phone]

## 2016-12-22 NOTE — H&P (Signed)
History and Physical    Heather Benson FTD:[id number] DOB: 19-Jan-1932 DOA: 12/22/2016  PCP: [name], MD  Patient coming from: Home  I have personally briefly reviewed patient's old medical records in [place]  Chief Complaint: BRBPR  HPI: Heather Benson is a 83 y.o. female with medical history significant of PAF on Eliquis.  Patient presents to the ED after waking up early this AM with bleeding from rectum.  She has been passing BRB and clots.  No abd pain.  Mild nausea.  Never had bleeding problems previously.  Colonoscopy by [facility] in past, not told of any pathology.   ED Course: Patient has had ongoing massive GIB in ED with ongoing blood loss.  BP low in triage, given 1L NS bolus, 1 unit PRBC transfusing, K-centra given.   Review of Systems: As per HPI otherwise 10 point review of systems negative.   Past Medical History:  Diagnosis Date  . Aortic stenosis, mild 12/14/2015  . Arthritis 08-28-11   right knee pain-surgery planned  . Cancer [facility]) 08-28-11   Melonoma-cheek(many Yrs ago) , recent bx. left  face lesion, past skin cancer lesions  . CAP (community acquired pneumonia) 12/13/2015  . CHF (congestive heart failure) ([place])   . Complication of anesthesia 08-28-11   in past arrythmia-  cardiology has seen in past  . Dysrhythmia 08-28-11   only immed.  to several days after surgery- hx. A.fib. in past  . GERD (gastroesophageal reflux disease) 08-28-11   tx. omeprazole  . Hypertension 08-28-11   tx. meds  . IBS (irritable bowel syndrome)   . Neuromuscular disorder ([place]) 08-28-11   hx. Polio age 60-slight residual affects legs  . Pneumonia 12/2015   hospital x 3 days  . PONV (postoperative nausea and vomiting)   . Swelling of extremity 08-28-11   bilateral lower extremities- mid calf down    Past Surgical History:  Procedure Laterality Date  . ABDOMINAL HYSTERECTOMY  08-28-11   Partial-vaginal approach  . BREAST REDUCTION SURGERY  1997  . BUNIONECTOMY   08-28-11   right  . CARDIOVERSION N/A 08/05/2014   Procedure: CARDIOVERSION;  Surgeon: Larey Dresser, MD;  Location: [facility];  Service: Cardiovascular;  Laterality: N/A;  . CATARACT EXTRACTION, BILATERAL  08-28-11   bilateral  . EYE SURGERY     Cataract  . JOINT REPLACEMENT  Oct. 1, 2012   Right Knee  . KNEE ARTHROSCOPY  09/06/2011   Procedure: ARTHROSCOPY KNEE;  Surgeon: Gearlean Alf, MD;  Location[facility];  Service: Orthopedics;  Laterality: Right;  with Synovectomy     reports that she has never smoked. She has never used smokeless tobacco. She reports that she does not drink alcohol or use drugs.  Allergies  Allergen Reactions  . Augmentin [Amoxicillin-Pot Clavulanate] Diarrhea and Nausea Only  . Boniva [Ibandronic Acid] Diarrhea  . Hydromorphone Nausea And Vomiting  . Latex Other (See Comments)    Unknown  . Lisinopril Cough  . Codeine Other (See Comments)    "Spaced out feeling"  . Tape Itching and Rash    Family History  Problem Relation Age of Onset  . Heart disease Mother        Varicose Veins  . Hyperlipidemia Mother   . Hypertension Mother   . Colon cancer Mother   . Heart disease Father        Heart Disease before age 63  . Hypertension Father   . Emphysema Father   . Breast  cancer Sister   . Stroke Maternal Grandmother   . Heart attack Brother      Prior to Admission medications   Medication Sig Start Date End Date Taking? Authorizing Provider  acetaminophen (TYLENOL) 500 MG tablet Take 1,000 mg by mouth every 8 (eight) hours as needed for moderate pain (pain in legs).   Yes [provider]  amiodarone (PACERONE) 200 MG tablet TAKE 1 TABLET (200 MG TOTAL) BY MOUTH DAILY. 10/31/15  Yes Jettie Booze, MD  amLODipine (NORVASC) 2.5 MG tablet Take 1 tablet (2.5 mg total) by mouth daily. 02/03/15  Yes Jettie Booze, MD  beclomethasone (QVAR) 80 MCG/ACT inhaler Inhale 2 puffs into the lungs 2 (two) times daily.   Yes [provider]  Cholecalciferol (VITAMIN D3) 2000 UNITS capsule Take 2,000 Units by mouth daily.   Yes [provider]  ELIQUIS 2.5 MG TABS tablet TAKE 1 TABLET BY MOUTH TWICE A DAY 11/26/16  Yes Jettie Booze, MD  fexofenadine (ALLEGRA) 180 MG tablet Take 180 mg by mouth daily.   Yes [provider]  fluticasone (FLONASE) 50 MCG/ACT nasal spray Place 1 spray into both nostrils daily as needed for allergies.  09/27/13  Yes [provider]  furosemide (LASIX) 40 MG tablet Take 20 mg by mouth daily. 12/09/15  Yes [provider]  guaiFENesin (MUCINEX) 600 MG 12 hr tablet Take 600 mg by mouth 2 (two) times daily.   Yes [provider]  latanoprost (XALATAN) 0.005 % ophthalmic solution Place 1 drop into both eyes at bedtime. 08/11/14  Yes [provider]  meclizine (ANTIVERT) 25 MG tablet Take 12.5 mg by mouth 3 (three) times daily as needed for dizziness.  04/01/14  Yes [provider]  pantoprazole (PROTONIX) 40 MG tablet Take 40 mg by mouth daily. 11/01/15  Yes [provider]  Respiratory Therapy Supplies (FLUTTER) DEVI Use as directed. 02/06/16   Marshell Garfinkel, MD    Physical Exam: Vitals:   12/22/16 0439 12/22/16 0445 12/22/16 0455 12/22/16 0500  BP: (!) 94/53 (!) 75/52 (!) 84/47 (!) 114/94  Pulse: 95 95 93 85  Resp: (!) 21 (!) 21 18 (!) 22  Temp: 97.8 F (36.6 C)  (!) 97.5 F (36.4 C)   TempSrc:   Oral   SpO2:  96% 95% 97%    Constitutional: NAD, calm, comfortable Eyes: PERRL, lids and conjunctivae normal ENMT: Mucous membranes are moist. Posterior pharynx clear of any exudate or lesions.Normal dentition.  Neck: normal, supple, no masses, no thyromegaly Respiratory: clear to auscultation bilaterally, no wheezing, no crackles. Normal respiratory effort. No accessory muscle use.  Cardiovascular: Regular rate and rhythm, no murmurs / rubs / gallops. No extremity edema. 2+ pedal pulses. No carotid bruits.    Abdomen: no tenderness, no masses palpated. No hepatosplenomegaly. Bowel sounds positive.  Musculoskeletal: no clubbing / cyanosis. No joint deformity upper and lower extremities. Good ROM, no contractures. Normal muscle tone.  Skin: no rashes, lesions, ulcers. No induration Neurologic: CN 2-12 grossly intact. Sensation intact, DTR normal. Strength 5/5 in all 4.  Psychiatric: Normal judgment and insight. Alert and oriented x 3. Normal mood.    Labs on Admission: I have personally reviewed following labs and imaging studies  CBC:  Recent Labs Lab 12/22/16 0230  WBC 18.3*  NEUTROABS 12.3*  HGB 13.7  HCT 45.3  MCV 89.2  PLT 161   Basic Metabolic Panel:  Recent Labs Lab 12/22/16 0230  NA 142  K 4.6  CL 103  CO2 25  GLUCOSE 152*  BUN 40*  CREATININE 2.40*  CALCIUM 9.1   GFR: Estimated Creatinine Clearance: 19.9 mL/min (A) (by C-G formula based on SCr of 2.4 mg/dL (H)). Liver Function Tests:  Recent Labs Lab 12/22/16 0230  AST 23  ALT 20  ALKPHOS 47  BILITOT 0.7  PROT 6.4*  ALBUMIN 3.1*   No results for input(s): LIPASE, AMYLASE in the last 168 hours. No results for input(s): AMMONIA in the last 168 hours. Coagulation Profile:  Recent Labs Lab 12/22/16 0230  INR 1.11   Cardiac Enzymes: No results for input(s): CKTOTAL, CKMB, CKMBINDEX, TROPONINI in the last 168 hours. BNP (last 3 results) No results for input(s): PROBNP in the last 8760 hours. HbA1C: No results for input(s): HGBA1C in the last 72 hours. CBG: No results for input(s): GLUCAP in the last 168 hours. Lipid Profile: No results for input(s): CHOL, HDL, LDLCALC, TRIG, CHOLHDL, LDLDIRECT in the last 72 hours. Thyroid Function Tests: No results for input(s): TSH, T4TOTAL, FREET4, T3FREE, THYROIDAB in the last 72 hours. Anemia Panel: No results for input(s): VITAMINB12, FOLATE, FERRITIN, TIBC, IRON, RETICCTPCT in the last 72 hours. Urine analysis:    Component Value Date/Time   COLORURINE  YELLOW 02/05/2011 0915   APPEARANCEUR CLEAR 02/05/2011 0915   LABSPEC 1.019 02/05/2011 0915   PHURINE 5.5 02/05/2011 0915   GLUCOSEU NEGATIVE 02/05/2011 0915   HGBUR NEGATIVE 02/05/2011 0915   BILIRUBINUR NEGATIVE 02/05/2011 0915   KETONESUR NEGATIVE 02/05/2011 0915   PROTEINUR NEGATIVE 02/05/2011 0915   UROBILINOGEN 0.2 02/05/2011 0915   NITRITE POSITIVE (A) 02/05/2011 0915   LEUKOCYTESUR MODERATE (A) 02/05/2011 0915    Radiological Exams on Admission: No results found.  EKG: Independently reviewed.  Assessment/Plan Principal Problem:   BRBPR (bright red blood per rectum) Active Problems:   PAF (paroxysmal atrial fibrillation) ([facility])   CKD (chronic kidney disease), stage IV ([facility])   Essential hypertension    1. BRBPR - 1. Massive GIB, probably lower, diverticular bleed seems likely 2. 1 unit PRBC transfusing, 2 more units ordered to be prepped and keep ahead 2 units 3. K centra given to reverse eliquis 4. GI consulted - recommended tagged NM RBC scan 5. Tagged NM RBC scan ordered stat 1. Per Dr. Marchia Bond, earliest this can be done is 0700 when they get the isotope in, he will make sure she is done first thing. 6. Bleeding appears to have slowed significantly per patient post K-Centra 7. Repeat H/H 1 hr after PRBC done infusing 8. Empirically plan to give next unit of blood quickly if patient drops BP again. 2. HTN - hold all BP meds 3. PAF - stop eliquis, continue amiodarone 4. CKD stage 4 - chronic and baseline at this point, follow during stay.  DVT prophylaxis: SCDs Code Status: DNR - this is confirmed with patient and family at bedside, they re-affirm this even though they understand she is currently having a life threatening bleed Family Communication: Family at bedside Disposition Plan: TBD Consults called: GI Dr. Inda Coke, spoke also with Dr. Marchia Bond with radiology to expidite the stat NM tagged RBC scan Admission status: Admit to inpatient - patient with life  threatening, massive GIB   [place], [place] [facility] Pager [phone]  If 7AM-7PM, please contact day team taking care of patient [email or web address] Password [email or web address]  12/22/2016, 5:30 AM

## 2016-12-22 NOTE — ED Notes (Signed)
RN [facility] informed of pt vitals

## 2016-12-22 NOTE — Plan of Care (Signed)
Problem: Safety: Goal: Ability to remain free from injury will improve Outcome: Progressing Pt understands to call for help when getting out of bed. No falls or injuries this shift. Patient educated about fall prevention. Pt verbalized understanding.    

## 2016-12-22 NOTE — ED Triage Notes (Signed)
Pt from home with c/o bleeding from her rectum that woke her up. Pt in the waiting room bathroom, she passed multiple clots and frank blood. BP 63/29, A&O x 4 at this time. C/o shortness of breath and mild chest discomfort. Pt currently on eliquis.

## 2016-12-22 NOTE — Consult Note (Addendum)
Referring Provider:  Dr. Roxanne Mins Primary Care Physician:  [name], MD Primary Gastroenterologist:  Sadie Haber primary  Reason for Consultation:  Bright red blood per rectum  HPI: Heather Benson is a 83 y.o. female with past medical history of paroxysmal atrial fibrillation on Eliquis was positive in the ER by family for evaluation of rectal bleeding. Patient seen and examined in the ICU. Family at bedside. Patient woke up around 1 AM with desire to go for a bowel movement and she noted blood clots in the commode. Had a few episodes of rectal bleeding. She denied any abdominal pain, nausea or vomiting. Denied any fever or chills. Denied history of bleeding in the past. Was found to have hypotension on initial evaluation in the ER. Hgb 13.7 on initial evaluation   She was subsequently resuscitated and was given K Centra to reverse anticoagulation.  At colonoscopy in 2000 which was unremarkable except for diminutive rectal polyp. Family history of colon cancer in mother in her late 61s   Past Medical History:  Diagnosis Date  . Aortic stenosis, mild 12/14/2015  . Arthritis 08-28-11   right knee pain-surgery planned  . Cancer [facility]) 08-28-11   Melonoma-cheek(many Yrs ago) , recent bx. left  face lesion, past skin cancer lesions  . CAP (community acquired pneumonia) 12/13/2015  . CHF (congestive heart failure) ([place])   . Complication of anesthesia 08-28-11   in past arrythmia-  cardiology has seen in past  . Dysrhythmia 08-28-11   only immed.  to several days after surgery- hx. A.fib. in past  . GERD (gastroesophageal reflux disease) 08-28-11   tx. omeprazole  . Hypertension 08-28-11   tx. meds  . IBS (irritable bowel syndrome)   . Neuromuscular disorder ([place]) 08-28-11   hx. Polio age 55-slight residual affects legs  . Pneumonia 12/2015   hospital x 3 days  . PONV (postoperative nausea and vomiting)   . Swelling of extremity 08-28-11   bilateral lower extremities- mid calf down    Past  Surgical History:  Procedure Laterality Date  . ABDOMINAL HYSTERECTOMY  08-28-11   Partial-vaginal approach  . BREAST REDUCTION SURGERY  1997  . BUNIONECTOMY  08-28-11   right  . CARDIOVERSION N/A 08/05/2014   Procedure: CARDIOVERSION;  Surgeon: Larey Dresser, MD;  Location: [place];  Service: Cardiovascular;  Laterality: N/A;  . CATARACT EXTRACTION, BILATERAL  08-28-11   bilateral  . EYE SURGERY     Cataract  . JOINT REPLACEMENT  Oct. 1, 2012   Right Knee  . KNEE ARTHROSCOPY  09/06/2011   Procedure: ARTHROSCOPY KNEE;  Surgeon: Gearlean Alf, MD;  Location: [facility];  Service: Orthopedics;  Laterality: Right;  with Synovectomy    Prior to Admission medications   Medication Sig Start Date End Date Taking? Authorizing Provider  acetaminophen (TYLENOL) 500 MG tablet Take 1,000 mg by mouth every 8 (eight) hours as needed for moderate pain (pain in legs).   Yes [provider]  amiodarone (PACERONE) 200 MG tablet TAKE 1 TABLET (200 MG TOTAL) BY MOUTH DAILY. 10/31/15  Yes Jettie Booze, MD  amLODipine (NORVASC) 2.5 MG tablet Take 1 tablet (2.5 mg total) by mouth daily. 02/03/15  Yes Jettie Booze, MD  beclomethasone (QVAR) 80 MCG/ACT inhaler Inhale 2 puffs into the lungs 2 (two) times daily.   Yes [provider]  Cholecalciferol (VITAMIN D3) 2000 UNITS capsule Take 2,000 Units by mouth daily.   Yes [provider]  ELIQUIS 2.5 MG TABS tablet  TAKE 1 TABLET BY MOUTH TWICE A DAY 11/26/16  Yes Jettie Booze, MD  fexofenadine (ALLEGRA) 180 MG tablet Take 180 mg by mouth daily.   Yes [provider]  fluticasone (FLONASE) 50 MCG/ACT nasal spray Place 1 spray into both nostrils daily as needed for allergies.  09/27/13  Yes [provider]  furosemide (LASIX) 40 MG tablet Take 20 mg by mouth daily. 12/09/15  Yes [provider]  guaiFENesin (MUCINEX) 600 MG 12 hr tablet Take 600 mg by mouth 2 (two) times daily.   Yes [provider]  latanoprost (XALATAN) 0.005 % ophthalmic solution Place 1 drop into both eyes at bedtime. 08/11/14  Yes [provider]  meclizine (ANTIVERT) 25 MG tablet Take 12.5 mg by mouth 3 (three) times daily as needed for dizziness.  04/01/14  Yes [provider]  pantoprazole (PROTONIX) 40 MG tablet Take 40 mg by mouth daily. 11/01/15  Yes [provider]  Respiratory Therapy Supplies (FLUTTER) DEVI Use as directed. 02/06/16   Mannam, Hart Robinsons, MD    Scheduled Meds: . amiodarone  200 mg Oral Daily  . budesonide  0.5 mg Inhalation BID  . guaiFENesin  600 mg Oral BID  . latanoprost  1 drop Both Eyes QHS  . [START ON 12/25/2016] pantoprazole  40 mg Intravenous Q12H   Continuous Infusions: . pantoprozole (PROTONIX) infusion 8 mg/hr (12/22/16 0600)   PRN Meds:.acetaminophen, ondansetron **OR** ondansetron (ZOFRAN) IV  Allergies as of 12/22/2016 - Review Complete 12/22/2016  Allergen Reaction Noted  . Augmentin [amoxicillin-pot clavulanate] Diarrhea and Nausea Only 12/13/2015  . Boniva [ibandronic acid] Diarrhea 11/09/2015  . Hydromorphone Nausea And Vomiting 08/05/2014  . Latex Other (See Comments) 12/13/2015  . Lisinopril Cough 11/09/2015  . Codeine Other (See Comments) 08/19/2011  . Tape Itching and Rash 12/13/2015    Family History  Problem Relation Age of Onset  . Heart disease Mother        Varicose Veins  . Hyperlipidemia Mother   . Hypertension Mother   . Colon cancer Mother   . Heart disease Father        Heart Disease before age 54  . Hypertension Father   . Emphysema Father   . Breast cancer Sister   . Stroke Maternal Grandmother   . Heart attack Brother     Social History   Social History  . Marital status: Married    Spouse name: [name]  . Number of children: 3  . Years of education: 12   Occupational History  . Retired- [employer] History Main Topics  . Smoking status: Never Smoker  . Smokeless tobacco: Never  Used  . Alcohol use No  . Drug use: No  . Sexual activity: Not Currently    Partners: Male   Other Topics Concern  . Not on file   Social History Narrative   Lives with husband, Hilbert Odor at home   Caffeine use: Drinks coffee/tea/caffeine free soda   OCCUPATION: retired     Review of Systems: Review of Systems  Constitutional: Negative for chills, fever, malaise/fatigue and weight loss.  HENT: Negative for ear pain, hearing loss and tinnitus.   Eyes: Negative for blurred vision and double vision.  Respiratory: Negative for cough, hemoptysis and sputum production.   Cardiovascular: Negative for chest pain and palpitations.  Gastrointestinal: Positive for blood in stool. Negative for abdominal pain, heartburn, nausea and vomiting.  Genitourinary: Negative for dysuria and urgency.  Musculoskeletal: Negative  for myalgias and neck pain.  Skin: Negative for itching and rash.  Neurological: Negative for seizures and loss of consciousness.  Endo/Heme/Allergies: Does not bruise/bleed easily.  Psychiatric/Behavioral: Negative for hallucinations and suicidal ideas.    Physical Exam: Vital signs: Vitals:   12/22/16 0833 12/22/16 0906  BP:    Pulse:    Resp:    Temp: 97.6 F (36.4 C)   SpO2:  100%     General:   Alert,  Well-developed, well-nourished, pleasant and cooperative in NAD HEENT : Normocephalic, atraumatic, no oral lesion. Dark coated tongue because of recent charcoal use Lungs:  Clear throughout to auscultation.   No wheezes, crackles, or rhonchi. No acute distress. Heart:  Regular rate and rhythm; no murmurs, clicks, rubs,  or gallops. Abdomen: Soft, nontender, nondistended, bowel sounds present. No peritoneal signs LE: No edema. Pulses present Psych : Alert and oriented 3. Mood and affect normal. Judgment normal Rectal. Not performed. Patient is being transported to get a bleeding scan done. Rectal exam done by ED physician showed bright red blood coming from the  rectum.  GI:  Lab Results:  Recent Labs  12/22/16 0230 12/22/16 0853  WBC 18.3*  --   HGB 13.7 9.9*  9.9*  HCT 45.3 32.2*  PLT 349  --    BMET  Recent Labs  12/22/16 0230  NA 142  K 4.6  CL 103  CO2 25  GLUCOSE 152*  BUN 40*  CREATININE 2.40*  CALCIUM 9.1   LFT  Recent Labs  12/22/16 0230  PROT 6.4*  ALBUMIN 3.1*  AST 23  ALT 20  ALKPHOS 47  BILITOT 0.7   PT/INR  Recent Labs  12/22/16 0230  LABPROT 14.3  INR 1.11     Studies/Results: No results found.  Impression/Plan: - Acute lower GI bleed. Most likely diverticular bleed versus hemorrhoid. - Paroxysmal atrial fibrillation on Eliquis. Last dose 9 PM yesterday S/P K - centra for reversal - Chronic kidney disease  Recommendation -------------------------- - Proceed with bleeding scan as ordered.  - Patient has a chronic kidney disease. Risk of worsening kidney function with use of contrast during angiogram need to be discussed with the patient first prior to proceeding with the interventional radiology guided embolization, if bleeding scan positive.  - If negative bleeding scan, she will need colonoscopy after holding Eliquis for at least 2 days given her chronic kidney disease (see has been given K - centra for reversal - Transfuse to keep hemoglobin more than 8. GI will follow    LOS: 0 days   Otis Brace  MD, FACP 12/22/2016, 9:54 AM  Pager [phone] If no answer or after 5 PM call [phone]

## 2016-12-22 NOTE — Progress Notes (Signed)
PROGRESS NOTE    Heather Benson  VHQ:[id number] DOB: [birth year] DOA: 12/22/2016 PCP: [name], MD   Brief Narrative:  83 y.o. WF PMHx Paroxysmal Atrial Fibrillation on Eliquis, Chronic Diastolic CHF, Aortic Stenosis, IBS (irritable bowel syndrome)   on Eliquis was positive in the ER by family for evaluation of rectal bleeding. Patient seen and examined in the ICU. Family at bedside. Patient woke up around 1 AM with desire to go for a bowel movement and she noted blood clots in the commode. Had a few episodes of rectal bleeding. She denied any abdominal pain, nausea or vomiting. Denied any fever or chills. Denied history of bleeding in the past. Was found to have hypotension on initial evaluation in the ER. Hgb 13.7 on initial evaluation   She was subsequently resuscitated and was given K Centra to reverse anticoagulation.  At colonoscopy in 2000 which was unremarkable except for diminutive rectal polyp. Family history of colon cancer in mother in her late 21s   Subjective: 8/12 A/O 4, negative SOB, negative abdominal pain, negative N/V, negative CP. States this is her first GI bleed. Last colonoscopy~20 years ago.    Assessment & Plan:   Principal Problem:   BRBPR (bright red blood per rectum) Active Problems:   PAF (paroxysmal atrial fibrillation) (HCC)   CKD (chronic kidney disease), stage IV (HCC)   Essential hypertension  Acute lower GI bleed/acute blood loss anemia -Negative RBC scan see results below -Spoke with [facility] Dr.Parag Brahmbhatt, patient will receive colonoscopy with negative nuclear med GI scan. Tentatively Tuesday -Hold all anticoagulation agents  Paroxysmal atrial relation -Rate controlled -Eliquis discontinued on admission -Patient received dose of K-Centra (off label use, used for reversal of Xarelto) -Transfuse for hemoglobin<8  Essential hypertension/Hypotension -Currently patient BP soft: D5-0.9% saline 83ml/hr -Hold all BP  medication   CKD stage IV (baseline Cr 1.9) -After stabilization of lower GI bleed, will require Nephrology consult, discuss possible future HD      DVT prophylaxis: SCD Code Status: DO NOT RESUSCITATE Family Communication: Husband and son present for discussion of plan of care Disposition Plan: TBD   Consultants:  [facility] Dr.Parag Brahmbhatt,    Procedures/Significant Events:  8/12 GI bleeding scan: Negative    I have personally reviewed and interpreted all radiology studies and my findings are as above.  VENTILATOR SETTINGS: Negative   Cultures Negative  Antimicrobials: Anti-infectives    None      Devices Negative   LINES / TUBES:  Negative    Continuous Infusions: . pantoprozole (PROTONIX) infusion 8 mg/hr (12/22/16 0600)     Objective: Vitals:   12/22/16 0615 12/22/16 0630 12/22/16 0833 12/22/16 0906  BP: 113/65 106/65    Pulse: 80 80    Resp: 19 19    Temp:   97.6 F (36.4 C)   TempSrc:   Oral   SpO2: 100% 100%  100%  Weight:   186 lb 8 oz (84.6 kg)     Intake/Output Summary (Last 24 hours) at 12/22/16 1020 Last data filed at 12/22/16 0737  Gross per 24 hour  Intake             2330 ml  Output                0 ml  Net             2330 ml   Filed Weights   12/22/16 0833  Weight: 186 lb 8 oz (84.6 kg)  Examination:  General: A/O 4, No acute respiratory distress Eyes: negative scleral hemorrhage, negative anisocoria, negative icterus Lungs: Clear to auscultation bilaterally without wheezes or crackles Cardiovascular: Irregular irregular rhythm and rate, without murmur gallop or rub normal S1 and S2 Abdomen: negative abdominal pain, nondistended, positive soft, bowel sounds, no rebound, no ascites, no appreciable mass Extremities: No significant cyanosis, clubbing, or edema bilateral lower extremities Skin: Negative rashes, lesions, ulcers Psychiatric:  Negative depression, negative anxiety, negative fatigue, negative mania   Central nervous system:  Cranial nerves II through XII intact, tongue/uvula midline, all extremities muscle strength 5/5, sensation intact throughout,  negative dysarthria, negative expressive aphasia, negative receptive aphasia.  .     Data Reviewed: Care during the described time interval was provided by me .  I have reviewed this patient's available data, including medical history, events of note, physical examination, and all test results as part of my evaluation.   CBC:  Recent Labs Lab 12/22/16 0230 12/22/16 0853  WBC 18.3*  --   NEUTROABS 12.3*  --   HGB 13.7 9.9*  9.9*  HCT 45.3 32.2*  MCV 89.2  --   PLT 349  --    Basic Metabolic Panel:  Recent Labs Lab 12/22/16 0230  NA 142  K 4.6  CL 103  CO2 25  GLUCOSE 152*  BUN 40*  CREATININE 2.40*  CALCIUM 9.1   GFR: Estimated Creatinine Clearance: 19.5 mL/min (A) (by C-G formula based on SCr of 2.4 mg/dL (H)). Liver Function Tests:  Recent Labs Lab 12/22/16 0230  AST 23  ALT 20  ALKPHOS 47  BILITOT 0.7  PROT 6.4*  ALBUMIN 3.1*   No results for input(s): LIPASE, AMYLASE in the last 168 hours. No results for input(s): AMMONIA in the last 168 hours. Coagulation Profile:  Recent Labs Lab 12/22/16 0230  INR 1.11   Cardiac Enzymes: No results for input(s): CKTOTAL, CKMB, CKMBINDEX, TROPONINI in the last 168 hours. BNP (last 3 results) No results for input(s): PROBNP in the last 8760 hours. HbA1C: No results for input(s): HGBA1C in the last 72 hours. CBG: No results for input(s): GLUCAP in the last 168 hours. Lipid Profile: No results for input(s): CHOL, HDL, LDLCALC, TRIG, CHOLHDL, LDLDIRECT in the last 72 hours. Thyroid Function Tests: No results for input(s): TSH, T4TOTAL, FREET4, T3FREE, THYROIDAB in the last 72 hours. Anemia Panel: No results for input(s): VITAMINB12, FOLATE, FERRITIN, TIBC, IRON, RETICCTPCT in the last 72 hours. Urine analysis:    Component Value Date/Time   COLORURINE  YELLOW 02/05/2011 0915   APPEARANCEUR CLEAR 02/05/2011 0915   LABSPEC 1.019 02/05/2011 0915   PHURINE 5.5 02/05/2011 0915   GLUCOSEU NEGATIVE 02/05/2011 0915   HGBUR NEGATIVE 02/05/2011 0915   BILIRUBINUR NEGATIVE 02/05/2011 0915   KETONESUR NEGATIVE 02/05/2011 0915   PROTEINUR NEGATIVE 02/05/2011 0915   UROBILINOGEN 0.2 02/05/2011 0915   NITRITE POSITIVE (A) 02/05/2011 0915   LEUKOCYTESUR MODERATE (A) 02/05/2011 0915   Sepsis Labs: @LABRCNTIP (procalcitonin:4,lacticidven:4)  )No results found for this or any previous visit (from the past 240 hour(s)).       Radiology Studies: No results found.      Scheduled Meds: . amiodarone  200 mg Oral Daily  . budesonide  0.5 mg Inhalation BID  . guaiFENesin  600 mg Oral BID  . latanoprost  1 drop Both Eyes QHS  . [START ON 12/25/2016] pantoprazole  40 mg Intravenous Q12H   Continuous Infusions: . pantoprozole (PROTONIX) infusion 8 mg/hr (12/22/16 0600)  LOS: 0 days    Time spent: 40 minutes    Heather Benson, Geraldo Docker, MD [facility] Pager [phone]   If 7PM-7AM, please contact night-coverage [email or web address] Password [email or web address] 12/22/2016, 10:20 AM

## 2016-12-23 LAB — CBC WITH DIFFERENTIAL/PLATELET
Basophils Absolute: 0.1 10*3/uL (ref 0.0–0.1)
Basophils Relative: 0 %
EOS ABS: 0.1 10*3/uL (ref 0.0–0.7)
Eosinophils Relative: 0 %
HEMATOCRIT: 29.2 % — AB (ref 36.0–46.0)
HEMOGLOBIN: 9 g/dL — AB (ref 12.0–15.0)
LYMPHS ABS: 3.2 10*3/uL (ref 0.7–4.0)
LYMPHS PCT: 13 %
MCH: 27.4 pg (ref 26.0–34.0)
MCHC: 30.8 g/dL (ref 30.0–36.0)
MCV: 88.8 fL (ref 78.0–100.0)
MONOS PCT: 6 %
Monocytes Absolute: 1.4 10*3/uL — ABNORMAL HIGH (ref 0.1–1.0)
NEUTROS PCT: 81 %
Neutro Abs: 18.9 10*3/uL — ABNORMAL HIGH (ref 1.7–7.7)
Platelets: 244 10*3/uL (ref 150–400)
RBC: 3.29 MIL/uL — AB (ref 3.87–5.11)
RDW: 18.7 % — ABNORMAL HIGH (ref 11.5–15.5)
WBC: 23.6 10*3/uL — AB (ref 4.0–10.5)

## 2016-12-23 LAB — URINALYSIS, ROUTINE W REFLEX MICROSCOPIC
Bilirubin Urine: NEGATIVE
Glucose, UA: 250 mg/dL — AB
Ketones, ur: NEGATIVE mg/dL
Nitrite: POSITIVE — AB
PH: 5.5 (ref 5.0–8.0)
Protein, ur: 100 mg/dL — AB
SPECIFIC GRAVITY, URINE: 1.025 (ref 1.005–1.030)

## 2016-12-23 LAB — BASIC METABOLIC PANEL
ANION GAP: 9 (ref 5–15)
BUN: 42 mg/dL — ABNORMAL HIGH (ref 6–20)
CALCIUM: 8.3 mg/dL — AB (ref 8.9–10.3)
CHLORIDE: 109 mmol/L (ref 101–111)
CO2: 24 mmol/L (ref 22–32)
Creatinine, Ser: 2.73 mg/dL — ABNORMAL HIGH (ref 0.44–1.00)
GFR calc non Af Amer: 15 mL/min — ABNORMAL LOW (ref >60)
GFR, EST AFRICAN AMERICAN: 17 mL/min — AB (ref >60)
GLUCOSE: 140 mg/dL — AB (ref 65–99)
POTASSIUM: 5.1 mmol/L (ref 3.5–5.1)
Sodium: 142 mmol/L (ref 135–145)

## 2016-12-23 LAB — MAGNESIUM: Magnesium: 2.1 mg/dL (ref 1.7–2.4)

## 2016-12-23 LAB — URINALYSIS, MICROSCOPIC (REFLEX)

## 2016-12-23 LAB — CBC
HCT: 26.6 % — ABNORMAL LOW (ref 36.0–46.0)
Hemoglobin: 8.1 g/dL — ABNORMAL LOW (ref 12.0–15.0)
MCH: 27.2 pg (ref 26.0–34.0)
MCHC: 30.5 g/dL (ref 30.0–36.0)
MCV: 89.3 fL (ref 78.0–100.0)
PLATELETS: 220 10*3/uL (ref 150–400)
RBC: 2.98 MIL/uL — AB (ref 3.87–5.11)
RDW: 18.8 % — AB (ref 11.5–15.5)
WBC: 23 10*3/uL — AB (ref 4.0–10.5)

## 2016-12-23 LAB — HEMOGLOBIN AND HEMATOCRIT, BLOOD
HCT: 29.9 % — ABNORMAL LOW (ref 36.0–46.0)
Hemoglobin: 9.3 g/dL — ABNORMAL LOW (ref 12.0–15.0)

## 2016-12-23 MED ORDER — ACETAMINOPHEN 325 MG PO TABS
650.0000 mg | ORAL_TABLET | Freq: Four times a day (QID) | ORAL | Status: DC | PRN
  Administered 2016-12-27: 650 mg via ORAL
  Filled 2016-12-23: qty 2

## 2016-12-23 MED ORDER — CIPROFLOXACIN IN D5W 200 MG/100ML IV SOLN
200.0000 mg | INTRAVENOUS | Status: DC
  Administered 2016-12-23 – 2016-12-26 (×4): 200 mg via INTRAVENOUS
  Filled 2016-12-23 (×5): qty 100

## 2016-12-23 MED ORDER — PANTOPRAZOLE SODIUM 40 MG PO TBEC
40.0000 mg | DELAYED_RELEASE_TABLET | Freq: Every day | ORAL | Status: DC
  Administered 2016-12-23 – 2016-12-30 (×8): 40 mg via ORAL
  Filled 2016-12-23 (×8): qty 1

## 2016-12-23 MED ORDER — SODIUM CHLORIDE 0.9 % IV SOLN: INTRAVENOUS | Status: DC

## 2016-12-23 MED ORDER — PEG 3350-KCL-NA BICARB-NACL 420 G PO SOLR
4000.0000 mL | Freq: Once | ORAL | Status: AC
  Administered 2016-12-23: 4000 mL via ORAL
  Filled 2016-12-23: qty 4000

## 2016-12-23 MED ORDER — GUAIFENESIN ER 600 MG PO TB12: 600.0000 mg | ORAL_TABLET | Freq: Two times a day (BID) | ORAL | Status: DC | PRN

## 2016-12-23 MED ORDER — FLUTICASONE PROPIONATE 50 MCG/ACT NA SUSP
1.0000 | Freq: Every day | NASAL | Status: DC | PRN
  Filled 2016-12-23: qty 16

## 2016-12-23 MED ORDER — MECLIZINE HCL 12.5 MG PO TABS
12.5000 mg | ORAL_TABLET | Freq: Three times a day (TID) | ORAL | Status: DC | PRN
  Filled 2016-12-23: qty 1

## 2016-12-23 NOTE — Progress Notes (Signed)
[facility] Progress Note  Subjective: No further signs of bleeding according to the patient and her husband. GI bleed scan was negative. We discussed further evaluation to include colonoscopy. She is agreeable.  Objective: Vital signs in last 24 hours: Temp:  [97.5 F (36.4 C)-98.3 F (36.8 C)] 97.8 F (36.6 C) (08/13 0741) Pulse Rate:  [73-139] 73 (08/13 0735) Resp:  [15-20] 16 (08/13 0735) BP: (88-119)/(52-62) 119/52 (08/13 0741) SpO2:  [90 %-100 %] 96 % (08/13 0735) Weight change:    PE:  No distress  Heart regular rhythm  Lungs clear  Abdomen soft and nontender  Lab Results: Results for orders placed or performed during the hospital encounter of 12/22/16 (from the past 24 hour(s))  MRSA PCR Screening     Status: None   Collection Time: 12/22/16 10:48 AM  Result Value Ref Range   MRSA by PCR NEGATIVE NEGATIVE  Basic metabolic panel     Status: Abnormal   Collection Time: 12/22/16  2:02 PM  Result Value Ref Range   Sodium 143 135 - 145 mmol/L   Potassium 4.8 3.5 - 5.1 mmol/L   Chloride 107 101 - 111 mmol/L   CO2 25 22 - 32 mmol/L   Glucose, Bld 173 (H) 65 - 99 mg/dL   BUN 37 (H) 6 - 20 mg/dL   Creatinine, Ser 2.39 (H) 0.44 - 1.00 mg/dL   Calcium 8.3 (L) 8.9 - 10.3 mg/dL   GFR calc non Af Amer 18 (L) >60 mL/min   GFR calc Af Amer 20 (L) >60 mL/min   Anion gap 11 5 - 15  Magnesium     Status: None   Collection Time: 12/22/16  2:02 PM  Result Value Ref Range   Magnesium 2.3 1.7 - 2.4 mg/dL  CBC     Status: Abnormal   Collection Time: 12/22/16  2:02 PM  Result Value Ref Range   WBC 28.9 (H) 4.0 - 10.5 K/uL   RBC 3.94 3.87 - 5.11 MIL/uL   Hemoglobin 10.9 (L) 12.0 - 15.0 g/dL   HCT 35.0 (L) 36.0 - 46.0 %   MCV 88.8 78.0 - 100.0 fL   MCH 27.7 26.0 - 34.0 pg   MCHC 31.1 30.0 - 36.0 g/dL   RDW 18.4 (H) 11.5 - 15.5 %   Platelets 294 150 - 400 K/uL  Hemoglobin and hematocrit, blood     Status: Abnormal   Collection Time: 12/22/16  9:01 PM  Result  Value Ref Range   Hemoglobin 10.0 (L) 12.0 - 15.0 g/dL   HCT 32.1 (L) 36.0 - 46.0 %  Hemoglobin and hematocrit, blood     Status: Abnormal   Collection Time: 12/23/16  3:24 AM  Result Value Ref Range   Hemoglobin 9.3 (L) 12.0 - 15.0 g/dL   HCT 29.9 (L) 36.0 - 64.4 %  Basic metabolic panel     Status: Abnormal   Collection Time: 12/23/16  7:54 AM  Result Value Ref Range   Sodium 142 135 - 145 mmol/L   Potassium 5.1 3.5 - 5.1 mmol/L   Chloride 109 101 - 111 mmol/L   CO2 24 22 - 32 mmol/L   Glucose, Bld 140 (H) 65 - 99 mg/dL   BUN 42 (H) 6 - 20 mg/dL   Creatinine, Ser 2.73 (H) 0.44 - 1.00 mg/dL   Calcium 8.3 (L) 8.9 - 10.3 mg/dL   GFR calc non Af Amer 15 (L) >60 mL/min   GFR calc Af Amer 17 (L) >60 mL/min  Anion gap 9 5 - 15  Magnesium     Status: None   Collection Time: 12/23/16  7:54 AM  Result Value Ref Range   Magnesium 2.1 1.7 - 2.4 mg/dL  CBC with Differential/Platelet     Status: Abnormal   Collection Time: 12/23/16  7:54 AM  Result Value Ref Range   WBC 23.6 (H) 4.0 - 10.5 K/uL   RBC 3.29 (L) 3.87 - 5.11 MIL/uL   Hemoglobin 9.0 (L) 12.0 - 15.0 g/dL   HCT 29.2 (L) 36.0 - 46.0 %   MCV 88.8 78.0 - 100.0 fL   MCH 27.4 26.0 - 34.0 pg   MCHC 30.8 30.0 - 36.0 g/dL   RDW 18.7 (H) 11.5 - 15.5 %   Platelets 244 150 - 400 K/uL   Neutrophils Relative % 81 %   Neutro Abs 18.9 (H) 1.7 - 7.7 K/uL   Lymphocytes Relative 13 %   Lymphs Abs 3.2 0.7 - 4.0 K/uL   Monocytes Relative 6 %   Monocytes Absolute 1.4 (H) 0.1 - 1.0 K/uL   Eosinophils Relative 0 %   Eosinophils Absolute 0.1 0.0 - 0.7 K/uL   Basophils Relative 0 %   Basophils Absolute 0.1 0.0 - 0.1 K/uL    Studies/Results: Nm Gi Blood Loss  Result Date: 12/22/2016 CLINICAL DATA:  GI bleeding EXAM: NUCLEAR MEDICINE GASTROINTESTINAL BLEEDING SCAN TECHNIQUE: Sequential abdominal images were obtained for 2 hours following intravenous administration of Tc-10m labeled red blood cells. RADIOPHARMACEUTICALS:  23.9 mCi Tc-42m  in-vitro labeled autologous red cells. COMPARISON:  None FINDINGS: Normal blood pool distribution of labeled red cells. No definite abnormal gastrointestinal localization of labeled red cells identified to suggest active GI bleeding. IMPRESSION: Negative GI bleeding scan. Electronically Signed   By: Lavonia Dana M.D.   On: 12/22/2016 14:05      Assessment: Lower GI bleed  Plan:   Colonoscopy will be planned for tomorrow    Cassell Clement 12/23/2016, 9:26 AM  Pager: [phone] If no answer or after 5 PM call [phone] Lab Results  Component Value Date   HGB 9.0 (L) 12/23/2016   HGB 9.3 (L) 12/23/2016   HGB 10.0 (L) 12/22/2016   HCT 29.2 (L) 12/23/2016   HCT 29.9 (L) 12/23/2016   HCT 32.1 (L) 12/22/2016   ALKPHOS 47 12/22/2016   ALKPHOS 37 (L) 12/11/2016   ALKPHOS 54 12/15/2015   AST 23 12/22/2016   AST 18 12/11/2016   AST 37 12/15/2015   ALT 20 12/22/2016   ALT 16 12/11/2016   ALT 22 12/15/2015

## 2016-12-23 NOTE — Progress Notes (Signed)
[name] TEAM 1 - Stepdown/ICU TEAM  Heather Benson  CHY:[id number] DOB: 1931-12-07 DOA: 12/22/2016 PCP: [name], MD    Brief Narrative:  83yo F w/ a Hx of Paroxysmal Atrial Fibrillation on Eliquis, Chronic Diastolic CHF, Aortic Stenosis, and IBS who was brought to the ER by family for evaluation of profuse rectal bleeding after she woke up around 1 AM due to the need to have a bowel movement and noted blood clots in the commode. She denied any abdominal pain, nausea or vomiting. In the ED she was hypotensive w/ a Hgb of 13.7.  She was given Heather Benson to reverse her anticoagulation.  Subjective: Bleeding has stopped since her admission.  Vomited her clear liquid breakfast this morning. Denies abdom pain, sob, cp, or HA.  No more bleeding this morning that she has noticed.     Assessment & Plan:  BRBPR - Acute lower GIB Suspect diverticular - for colo in AM   Acute blood loss anemia Follow in serial fashion - appears to have stabilized for now  Paroxysmal Atrial fib on Eliquis  Presently in NSR - holding anticoag in setting of acute bleeding   Hypotension w/ hx of HTN BP remains borderline - increase IVF support  CKD Stage 4 Baseline creatinine is 1.9 - crt is climbing - assure pt remains hydrated - follow trend - likely low grade ATN related to transient hypotension   DVT prophylaxis: SCDs Code Status: NO CODE BLUE - DNR  Family Communication: spoke w/ husband at bedside  Disposition Plan: SDU   Consultants:  [facility]   Procedures: 8/12 nuc med bleeding scan - negative   Antimicrobials:  none  Objective: Blood pressure (!) 119/52, pulse 73, temperature 97.8 F (36.6 C), temperature source Oral, resp. rate 16, height 5\' 5"  (1.651 m), weight 84.6 kg (186 lb 8 oz), SpO2 96 %.  Intake/Output Summary (Last 24 hours) at 12/23/16 1144 Last data filed at 12/23/16 0654  Gross per 24 hour  Intake             1115 ml  Output              300 ml  Net              815  ml   Filed Weights   12/22/16 0833  Weight: 84.6 kg (186 lb 8 oz)    Examination: General: No acute respiratory distress Lungs: Clear to auscultation bilaterally without wheezes or crackles Cardiovascular: Regular rate and rhythm without murmur gallop or rub normal S1 and S2 Abdomen: Nontender, nondistended, soft, bowel sounds positive, no rebound, no ascites, no appreciable mass Extremities: No significant cyanosis, clubbing, or edema bilateral lower extremities  CBC:  Recent Labs Lab 12/22/16 0230 12/22/16 0853 12/22/16 1402 12/22/16 2101 12/23/16 0324 12/23/16 0754  WBC 18.3*  --  28.9*  --   --  23.6*  NEUTROABS 12.3*  --   --   --   --  18.9*  HGB 13.7 9.9*  9.9* 10.9* 10.0* 9.3* 9.0*  HCT 45.3 32.2* 35.0* 32.1* 29.9* 29.2*  MCV 89.2  --  88.8  --   --  88.8  PLT 349  --  294  --   --  878   Basic Metabolic Panel:  Recent Labs Lab 12/22/16 0230 12/22/16 1402 12/23/16 0754  NA 142 143 142  K 4.6 4.8 5.1  CL 103 107 109  CO2 25 25 24   GLUCOSE 152* 173* 140*  BUN  40* 37* 42*  CREATININE 2.40* 2.39* 2.73*  CALCIUM 9.1 8.3* 8.3*  MG  --  2.3 2.1   GFR: Estimated Creatinine Clearance: 16.8 mL/min (A) (by C-G formula based on SCr of 2.73 mg/dL (H)).  Liver Function Tests:  Recent Labs Lab 12/22/16 0230  AST 23  ALT 20  ALKPHOS 47  BILITOT 0.7  PROT 6.4*  ALBUMIN 3.1*    Coagulation Profile:  Recent Labs Lab 12/22/16 0230  INR 1.11    Recent Results (from the past 240 hour(s))  MRSA PCR Screening     Status: None   Collection Time: 12/22/16 10:48 AM  Result Value Ref Range Status   MRSA by PCR NEGATIVE NEGATIVE Final    Comment:        The GeneXpert MRSA Assay (FDA approved for NASAL specimens only), is one component of a comprehensive MRSA colonization surveillance program. It is not intended to diagnose MRSA infection nor to guide or monitor treatment for MRSA infections.      Scheduled Meds: . amiodarone  200 mg Oral Daily    . budesonide  0.5 mg Inhalation BID  . guaiFENesin  600 mg Oral BID  . latanoprost  1 drop Both Eyes QHS  . [START ON 12/25/2016] pantoprazole  40 mg Intravenous Q12H  . polyethylene glycol-electrolytes  4,000 mL Oral Once     LOS: 1 day   Cherene Altes, MD [facility] Office  [phone] Pager - Text Page per Amion as per below:  On-Call/Text Page:      [email or web address]      password [email or web address]  If 7PM-7AM, please contact night-coverage [email or web address] Password [facility] 12/23/2016, 11:44 AM

## 2016-12-23 NOTE — Progress Notes (Signed)
Dr. Thereasa Solo notified of elevated WBC count of 23.6 this am (down from 28.9 yesterday, 8/12). Also informed urinalysis has not been checked this admission. MD to follow up. Order placed to collect urinalysis and urine culture sample. Pt notified of this and is aware of need for urine sample. Will continue to monitor.

## 2016-12-23 NOTE — Anesthesia Preprocedure Evaluation (Addendum)
Anesthesia Evaluation  Patient identified by MRN, date of birth, ID band Patient awake    Reviewed: Allergy & Precautions, H&P , NPO status , Patient's Chart, lab work & pertinent test results  History of Anesthesia Complications (+) PONV  Airway Mallampati: III  TM Distance: >3 FB Neck ROM: Full    Dental no notable dental hx. (+) Upper Dentures, Dental Advisory Given   Pulmonary neg pulmonary ROS,    Pulmonary exam normal breath sounds clear to auscultation       Cardiovascular Exercise Tolerance: Good hypertension, Pt. on medications +CHF  + dysrhythmias Atrial Fibrillation  Rhythm:Regular Rate:Normal     Neuro/Psych negative neurological ROS  negative psych ROS   GI/Hepatic Neg liver ROS, GERD  Medicated and Controlled,  Endo/Other  negative endocrine ROS  Renal/GU Renal disease  negative genitourinary   Musculoskeletal  (+) Arthritis , Osteoarthritis,    Abdominal   Peds  Hematology  (+) anemia ,   Anesthesia Other Findings   Reproductive/Obstetrics negative OB ROS                            Anesthesia Physical Anesthesia Plan  ASA: III  Anesthesia Plan: MAC   Post-op Pain Management:    Induction: Intravenous  PONV Risk Score and Plan: 3 and Propofol infusion and Treatment may vary due to age or medical condition  Airway Management Planned: Simple Face Mask  Additional Equipment:   Intra-op Plan:   Post-operative Plan:   Informed Consent: I have reviewed the patients History and Physical, chart, labs and discussed the procedure including the risks, benefits and alternatives for the proposed anesthesia with the patient or authorized representative who has indicated his/her understanding and acceptance.   Dental advisory given  Plan Discussed with: CRNA  Anesthesia Plan Comments:         Anesthesia Quick Evaluation

## 2016-12-24 ENCOUNTER — Encounter (HOSPITAL_COMMUNITY): Payer: Self-pay | Admitting: Certified Registered Nurse Anesthetist

## 2016-12-24 ENCOUNTER — Inpatient Hospital Stay (HOSPITAL_COMMUNITY): Payer: Medicare Other | Admitting: Anesthesiology

## 2016-12-24 ENCOUNTER — Inpatient Hospital Stay (HOSPITAL_COMMUNITY): Payer: Medicare Other

## 2016-12-24 ENCOUNTER — Encounter (HOSPITAL_COMMUNITY)
Admission: EM | Disposition: A | Discharge status: Skilled Nursing Facility | Payer: Self-pay | Source: Home / Self Care | Attending: Internal Medicine

## 2016-12-24 HISTORY — PX: COLONOSCOPY WITH PROPOFOL: SHX5780

## 2016-12-24 LAB — CBC WITH DIFFERENTIAL/PLATELET
BASOS ABS: 0 10*3/uL (ref 0.0–0.1)
BASOS PCT: 0 %
BASOS PCT: 0 %
Basophils Absolute: 0 10*3/uL (ref 0.0–0.1)
EOS ABS: 0.2 10*3/uL (ref 0.0–0.7)
Eosinophils Absolute: 0.1 10*3/uL (ref 0.0–0.7)
Eosinophils Relative: 1 %
Eosinophils Relative: 1 %
HEMATOCRIT: 23.7 % — AB (ref 36.0–46.0)
HEMATOCRIT: 28.7 % — AB (ref 36.0–46.0)
HEMOGLOBIN: 7.5 g/dL — AB (ref 12.0–15.0)
HEMOGLOBIN: 9 g/dL — AB (ref 12.0–15.0)
LYMPHS ABS: 3.1 10*3/uL (ref 0.7–4.0)
Lymphocytes Relative: 18 %
Lymphocytes Relative: 20 %
Lymphs Abs: 2.3 10*3/uL (ref 0.7–4.0)
MCH: 28.1 pg (ref 26.0–34.0)
MCH: 28.4 pg (ref 26.0–34.0)
MCHC: 31.6 g/dL (ref 30.0–36.0)
MCHC: 31.4 g/dL (ref 30.0–36.0)
MCV: 88.8 fL (ref 78.0–100.0)
MCV: 90.5 fL (ref 78.0–100.0)
MONOS PCT: 5 %
MONOS PCT: 6 %
Monocytes Absolute: 0.7 10*3/uL (ref 0.1–1.0)
Monocytes Absolute: 0.9 10*3/uL (ref 0.1–1.0)
NEUTROS ABS: 9.3 10*3/uL — AB (ref 1.7–7.7)
NEUTROS ABS: 11.1 10*3/uL — AB (ref 1.7–7.7)
NEUTROS PCT: 76 %
NEUTROS PCT: 73 %
Platelets: 171 10*3/uL (ref 150–400)
Platelets: 204 10*3/uL (ref 150–400)
RBC: 2.67 MIL/uL — AB (ref 3.87–5.11)
RBC: 3.17 MIL/uL — AB (ref 3.87–5.11)
RDW: 18.3 % — ABNORMAL HIGH (ref 11.5–15.5)
RDW: 18.6 % — ABNORMAL HIGH (ref 11.5–15.5)
WBC: 12.4 10*3/uL — AB (ref 4.0–10.5)
WBC: 15.3 10*3/uL — AB (ref 4.0–10.5)

## 2016-12-24 LAB — CBC
HEMATOCRIT: 22.8 % — AB (ref 36.0–46.0)
HEMOGLOBIN: 7.1 g/dL — AB (ref 12.0–15.0)
MCH: 27.8 pg (ref 26.0–34.0)
MCHC: 31.1 g/dL (ref 30.0–36.0)
MCV: 89.4 fL (ref 78.0–100.0)
Platelets: 213 10*3/uL (ref 150–400)
RBC: 2.55 MIL/uL — AB (ref 3.87–5.11)
RDW: 18.7 % — ABNORMAL HIGH (ref 11.5–15.5)
WBC: 17.1 10*3/uL — ABNORMAL HIGH (ref 4.0–10.5)

## 2016-12-24 LAB — BASIC METABOLIC PANEL
ANION GAP: 6 (ref 5–15)
BUN: 30 mg/dL — ABNORMAL HIGH (ref 6–20)
CO2: 25 mmol/L (ref 22–32)
Calcium: 7.9 mg/dL — ABNORMAL LOW (ref 8.9–10.3)
Chloride: 111 mmol/L (ref 101–111)
Creatinine, Ser: 2.31 mg/dL — ABNORMAL HIGH (ref 0.44–1.00)
GFR calc non Af Amer: 18 mL/min — ABNORMAL LOW (ref >60)
GFR, EST AFRICAN AMERICAN: 21 mL/min — AB (ref >60)
GLUCOSE: 120 mg/dL — AB (ref 65–99)
POTASSIUM: 4 mmol/L (ref 3.5–5.1)
Sodium: 142 mmol/L (ref 135–145)

## 2016-12-24 LAB — PREPARE RBC (CROSSMATCH)

## 2016-12-24 LAB — TROPONIN I: Troponin I: 0.06 ng/mL (ref <0.03)

## 2016-12-24 SURGERY — COLONOSCOPY WITH PROPOFOL
Anesthesia: Monitor Anesthesia Care

## 2016-12-24 MED ORDER — LIDOCAINE HCL (CARDIAC) 20 MG/ML IV SOLN
INTRAVENOUS | Status: DC | PRN
  Administered 2016-12-24: 80 mg via INTRAVENOUS

## 2016-12-24 MED ORDER — GLYCOPYRROLATE 0.2 MG/ML IJ SOLN
INTRAMUSCULAR | Status: DC | PRN
  Administered 2016-12-24: 0.2 mg via INTRAVENOUS

## 2016-12-24 MED ORDER — SODIUM CHLORIDE 0.9 % IV BOLUS (SEPSIS)
250.0000 mL | Freq: Once | INTRAVENOUS | Status: AC
  Administered 2016-12-24: 250 mL via INTRAVENOUS

## 2016-12-24 MED ORDER — METOPROLOL TARTRATE 5 MG/5ML IV SOLN
INTRAVENOUS | Status: AC
  Administered 2016-12-24: 5 mg
  Filled 2016-12-24: qty 5

## 2016-12-24 MED ORDER — SODIUM CHLORIDE 0.9 % IV SOLN
Freq: Once | INTRAVENOUS | Status: AC
  Administered 2016-12-24: 05:00:00 via INTRAVENOUS

## 2016-12-24 MED ORDER — IOPAMIDOL (ISOVUE-300) INJECTION 61%
15.0000 mL | INTRAVENOUS | Status: AC
  Administered 2016-12-24 (×2): 15 mL via ORAL

## 2016-12-24 MED ORDER — METOPROLOL TARTRATE 5 MG/5ML IV SOLN: 5.0000 mg | Freq: Once | INTRAVENOUS | Status: AC

## 2016-12-24 MED ORDER — PROPOFOL 10 MG/ML IV BOLUS
INTRAVENOUS | Status: DC | PRN
  Administered 2016-12-24: 20 mg via INTRAVENOUS

## 2016-12-24 MED ORDER — PROPOFOL 500 MG/50ML IV EMUL
INTRAVENOUS | Status: DC | PRN
  Administered 2016-12-24: 100 ug/kg/min via INTRAVENOUS

## 2016-12-24 MED ORDER — LACTATED RINGERS IV SOLN
INTRAVENOUS | Status: DC | PRN
  Administered 2016-12-24: 08:00:00 via INTRAVENOUS

## 2016-12-24 SURGICAL SUPPLY — 21 items

## 2016-12-24 NOTE — Progress Notes (Signed)
PROGRESS NOTE    Heather Benson  ZOX:[id number] DOB: 1932/02/01 DOA: 12/22/2016 PCP: [name], MD    Brief Narrative:  83 year old female who presented with the chief complaint of bright red blood per rectum. Patient is known to have paroxysmal atrial fibrillation on apixaban. He presented with an acute episode of rectal bleeding, passing clots and bright red blood per rectum. No abdominal pain, nausea or vomiting. On initial physical examination blood pressure 94/53, heart rate 95, respiratory rate 21, temperature 97.8, oxygen saturation is 96%. Moist mucous membranes, lungs clear to auscultation bilaterally, heart S1-S2 present and rhythmic, abdomen was soft nontender, no organomegaly, no lower extremity edema. Her hemoglobin was 13.7. Patient was admitted to the hospital with working diagnosis of severe lower GI bleed with impending acute blood loss anemia. Patient received 1 unit of packed red blood cells, gastroenterology was consulted, colonoscopy was performed, findings consistent with diverticulosis, and the sigmoid colon. Polypoid mass at the base of the cecum, questionable large everted bulging appendix. CT of the abdomen was ordered   Assessment & Plan:   Principal Problem:   BRBPR (bright red blood per rectum) Active Problems:   PAF (paroxysmal atrial fibrillation) (HCC)   CKD (chronic kidney disease), stage IV (HCC)   Essential hypertension   1. Acute lower GI bleed due to diverticulosis. Hb and hct stable at 7.1 and 7.5, will continue to follow on cell count, no reported active bleeding on colonoscopy. Will advance diet to clears, per GI recommendations. Follow on CT abdomen and pelvis, follow up on appendix. Started on ciprofloxacin.   2. Acute blood loss anemia. Stable hb and hct, no signs of further bleeding, patient sp one unit prbc transfusion, will follow cell count in am.   3. Paroxysmal atrial fibrillation. Rate controlled atrial fibrillation, will continue to  hold on anticoagulation, will resume when recommended per GI, patient benefits from anticoagulation due to atrial fibrillation. Continue amiodarone.   4. Hypertension. Blood pressure more stable, will continue close monitoring, MAP 65.    DVT prophylaxis: scd Code Status: DNR Family Communication: I spoke with patient's husband at the bedside and all questions were addressed.  Disposition Plan: home    Consultants:   Gastroenterology   Procedures:    Antimicrobials:    Subjective: Patient with somnolence sp colonoscopy, no nausea or vomiting, no significant abdominal pain, no chest pain. No further melena or hematochezia.   Objective: Vitals:   12/24/16 0910 12/24/16 0940 12/24/16 1000 12/24/16 1100  BP: (!) 114/46 (!) 117/53 (!) 101/45 (!) 105/51  Pulse: 74 70 69 66  Resp: 17 15 (!) 0 16  Temp:  97.7 F (36.5 C)    TempSrc:  Oral    SpO2: 100% 94% 90% 100%  Weight:      Height:        Intake/Output Summary (Last 24 hours) at 12/24/16 1129 Last data filed at 12/24/16 0853  Gross per 24 hour  Intake          3923.66 ml  Output              201 ml  Net          3722.66 ml   Filed Weights   12/22/16 0833 12/24/16 0749  Weight: 84.6 kg (186 lb 8 oz) 84.4 kg (186 lb)    Examination:  General exam: deconditioned E ENT. Positive pallor, no icterus, oral mucosa moist.  Respiratory system: Clear to auscultation. Respiratory effort normal. No wheezing, rales  or rhonchi.  Cardiovascular system: S1 & S2 heard, RRR. No JVD, murmurs, rubs, gallops or clicks. No pedal edema. Gastrointestinal system: Abdomen is nondistended, soft and nontender. No organomegaly or masses felt. Normal bowel sounds heard. Central nervous system: Alert and oriented. No focal neurological deficits. Extremities: Symmetric 5 x 5 power. Skin: No rashes, lesions or ulcers     Data Reviewed: I have personally reviewed following labs and imaging studies  CBC:  Recent Labs Lab 12/22/16 0230   12/22/16 1402 12/22/16 2101 12/23/16 0324 12/23/16 0754 12/23/16 1354 12/24/16 0204  WBC 18.3*  --  28.9*  --   --  23.6* 23.0* 17.1*  NEUTROABS 12.3*  --   --   --   --  18.9*  --   --   HGB 13.7  < > 10.9* 10.0* 9.3* 9.0* 8.1* 7.1*  HCT 45.3  < > 35.0* 32.1* 29.9* 29.2* 26.6* 22.8*  MCV 89.2  --  88.8  --   --  88.8 89.3 89.4  PLT 349  --  294  --   --  244 220 213  < > = values in this interval not displayed. Basic Metabolic Panel:  Recent Labs Lab 12/22/16 0230 12/22/16 1402 12/23/16 0754 12/24/16 0204  NA 142 143 142 142  K 4.6 4.8 5.1 4.0  CL 103 107 109 111  CO2 25 25 24 25   GLUCOSE 152* 173* 140* 120*  BUN 40* 37* 42* 30*  CREATININE 2.40* 2.39* 2.73* 2.31*  CALCIUM 9.1 8.3* 8.3* 7.9*  MG  --  2.3 2.1  --    GFR: Estimated Creatinine Clearance: 19.8 mL/min (A) (by C-G formula based on SCr of 2.31 mg/dL (H)). Liver Function Tests:  Recent Labs Lab 12/22/16 0230  AST 23  ALT 20  ALKPHOS 47  BILITOT 0.7  PROT 6.4*  ALBUMIN 3.1*   No results for input(s): LIPASE, AMYLASE in the last 168 hours. No results for input(s): AMMONIA in the last 168 hours. Coagulation Profile:  Recent Labs Lab 12/22/16 0230  INR 1.11   Cardiac Enzymes:  Recent Labs Lab 12/24/16 0204  TROPONINI 0.06*   BNP (last 3 results) No results for input(s): PROBNP in the last 8760 hours. HbA1C: No results for input(s): HGBA1C in the last 72 hours. CBG: No results for input(s): GLUCAP in the last 168 hours. Lipid Profile: No results for input(s): CHOL, HDL, LDLCALC, TRIG, CHOLHDL, LDLDIRECT in the last 72 hours. Thyroid Function Tests: No results for input(s): TSH, T4TOTAL, FREET4, T3FREE, THYROIDAB in the last 72 hours. Anemia Panel: No results for input(s): VITAMINB12, FOLATE, FERRITIN, TIBC, IRON, RETICCTPCT in the last 72 hours. Sepsis Labs: No results for input(s): PROCALCITON, LATICACIDVEN in the last 168 hours.  Recent Results (from the past 240 hour(s))  MRSA PCR  Screening     Status: None   Collection Time: 12/22/16 10:48 AM  Result Value Ref Range Status   MRSA by PCR NEGATIVE NEGATIVE Final    Comment:        The GeneXpert MRSA Assay (FDA approved for NASAL specimens only), is one component of a comprehensive MRSA colonization surveillance program. It is not intended to diagnose MRSA infection nor to guide or monitor treatment for MRSA infections.          Radiology Studies: Nm Gi Blood Loss  Result Date: 12/22/2016 CLINICAL DATA:  GI bleeding EXAM: NUCLEAR MEDICINE GASTROINTESTINAL BLEEDING SCAN TECHNIQUE: Sequential abdominal images were obtained for 2 hours following intravenous administration of Tc-59m  labeled red blood cells. RADIOPHARMACEUTICALS:  23.9 mCi Tc-38m in-vitro labeled autologous red cells. COMPARISON:  None FINDINGS: Normal blood pool distribution of labeled red cells. No definite abnormal gastrointestinal localization of labeled red cells identified to suggest active GI bleeding. IMPRESSION: Negative GI bleeding scan. Electronically Signed   By: Lavonia Dana M.D.   On: 12/22/2016 14:05        Scheduled Meds: . amiodarone  200 mg Oral Daily  . budesonide  0.5 mg Inhalation BID  . latanoprost  1 drop Both Eyes QHS  . pantoprazole  40 mg Oral Q1200   Continuous Infusions: . ciprofloxacin Stopped (12/23/16 2130)  . dextrose 5 % and 0.9% NaCl 85 mL/hr at 12/24/16 1040     LOS: 2 days       Deryl Giroux Gerome Apley, MD [facility] Pager [phone]  If 7PM-7AM, please contact night-coverage [email or web address] Password [facility] 12/24/2016, 11:29 AM

## 2016-12-24 NOTE — Op Note (Signed)
[facility] Patient Name: Heather Benson Procedure Date : 12/24/2016 MRN: [id number] Attending MD: Wonda Horner , MD Date of Birth: 1932-03-24 CSN: [id number] Age: 83 Admit Type: Inpatient Procedure:                Colonoscopy Indications:              Rectal bleeding Providers:                Wonda Horner, MD, Kingsley Plan, RN, Angus Seller Tinnie Gens, Technician Referring MD:              Medicines:                Propofol per Anesthesia Complications:            No immediate complications. Estimated Blood Loss:     Estimated blood loss: none. Procedure:                Pre-Anesthesia Assessment:                           - Prior to the procedure, a History and Physical                            was performed, and patient medications and                            allergies were reviewed. The patient's tolerance of                            previous anesthesia was also reviewed. The risks                            and benefits of the procedure and the sedation                            options and risks were discussed with the patient.                            All questions were answered, and informed consent                            was obtained. Prior Anticoagulants: The patient has                            taken Eliquis (apixaban), last dose was 3 days                            prior to procedure. ASA Grade Assessment: III - A                            patient with severe systemic disease. After  reviewing the risks and benefits, the patient was                            deemed in satisfactory condition to undergo the                            procedure.                           After obtaining informed consent, the colonoscope                            was passed under direct vision. Throughout the                            procedure, the patient's blood pressure, pulse, and                    oxygen saturations were monitored continuously. The                            EC-[id number] ([id number]) scope was introduced through                            the anus and advanced to the the cecum, identified                            by appendiceal orifice and ileocecal valve. The                            ileocecal valve, appendiceal orifice, and rectum                            were photographed. The colonoscopy was performed                            without difficulty. The patient tolerated the                            procedure well. The quality of the bowel                            preparation was adequate. Scope In: 8:20:01 AM Scope Out: 8:41:54 AM Scope Withdrawal Time: 0 hours 13 minutes 37 seconds  Total Procedure Duration: 0 hours 21 minutes 53 seconds  Findings:      The perianal and digital rectal examinations were normal.      A 10 mm polyp was found in the recto-sigmoid colon. The polyp was       sessile. The polyp was removed with a hot snare. Resection and retrieval       were complete.      Multiple small and large-mouthed diverticula were found in the sigmoid       colon and descending colon.      A polypoid large mass was found in the cecum. This was at the base of       the cecum.  This was biopsied with a cold forceps for histology. After       one of the biopsies some purulent material was seen momentarily. Impression:               - One 10 mm polyp at the recto-sigmoid colon,                            removed with a hot snare. Resected and retrieved.                           - Diverticulosis in the sigmoid colon and in the                            descending colon. Suspect that the bleeding was                            diverticular. No bleeding now.                           - Polypoid mass at base of cecum. On one biopsy                            some purulent material was noted. I wonder if this                            could be  a large everted bulging appendix. I wonder                            if this could explain her elevated WBCs. Moderate Sedation:      . Recommendation:           - Clear liquid diet today.                           - Continue present medications.                           - Repeat colonoscopy is not recommended for                            screening purposes.                           - Will order CT abdomen and pelvis. Attention to                            appendiceal area. Procedure Code(s):        --- Professional ---                           [phone], Colonoscopy, flexible; with removal of                            tumor(s), polyp(s), or other lesion(s) by snare  technique                           [id number], 59, Colonoscopy, flexible; with biopsy,                            single or multiple Diagnosis Code(s):        --- Professional ---                           D12.7, Benign neoplasm of rectosigmoid junction                           K62.5, Hemorrhage of anus and rectum                           K57.30, Diverticulosis of large intestine without                            perforation or abscess without bleeding CPT copyright 2016 American [employer] Association. All rights reserved. The codes documented in this report are preliminary and upon coder review may  be revised to meet current compliance requirements. Wonda Horner, MD 12/24/2016 9:02:43 AM This report has been signed electronically. Number of Addenda: 0

## 2016-12-24 NOTE — Progress Notes (Signed)
Pt transferred per bed to [facility] bed 10 with all belongings. Husband aware of new room.

## 2016-12-24 NOTE — Progress Notes (Signed)
16 RN Notified that Pt HR was 145 and sustained. Pt is AoX4 and has no complaints of cp, sob or chest tightness. Pt does not appear in distress. MD paged. EKG obtained. O2 applied. Rapid response called. E-link RN notified.  0000 Rapid Response RN at bedside. E-link MD on camera. Pt HR 160-170s. Pt has no complaints of cp or Sob or chest tightness. Pt does not appear to be in distress. E-link MD gave orders to give Lopressor 5mg  IV X1 dose. Med was given per orders.     0117 Pt HR 72 NSR with PAC's BP 131/58. Pt is resting comfortably. Will continue to monitor.

## 2016-12-24 NOTE — Anesthesia Postprocedure Evaluation (Signed)
Anesthesia Post Note  Patient: Heather Benson  Procedure(s) Performed: Procedure(s) (LRB): COLONOSCOPY WITH PROPOFOL (N/A)     Patient location during evaluation: PACU Anesthesia Type: MAC Level of consciousness: awake and alert Pain management: pain level controlled Vital Signs Assessment: post-procedure vital signs reviewed and stable Respiratory status: spontaneous breathing, nonlabored ventilation, respiratory function stable and patient connected to nasal cannula oxygen Cardiovascular status: stable and blood pressure returned to baseline Anesthetic complications: no    Last Vitals:  Vitals:   12/24/16 0901 12/24/16 0910  BP: (!) 121/45 (!) 114/46  Pulse: 78 74  Resp: 19 17  Temp:    SpO2: 98% 100%    Last Pain:  Vitals:   12/24/16 0852  TempSrc: Oral  PainSc:                  Elfego Giammarino,W. EDMOND

## 2016-12-24 NOTE — Progress Notes (Signed)
Called report to [place] spoke to [employer]

## 2016-12-24 NOTE — Progress Notes (Signed)
RN called for acute onset of SVT, pt has been in NSR until this event. Pt alert and oriented, denies SOB or back pain, endorses some chest discomfort that lasted a "few seconds" PCCM and Schorr NP paged prior to arrival. 5mg  Lopressor IVP given, pt returned to NSR. No interventions from RRT

## 2016-12-24 NOTE — Transfer of Care (Signed)
Immediate Anesthesia Transfer of Care Note  Patient: Heather Benson  Procedure(s) Performed: Procedure(s): COLONOSCOPY WITH PROPOFOL (N/A)  Patient Location: Endoscopy Unit  Anesthesia Type:MAC  Level of Consciousness: awake, alert  and oriented  Airway & Oxygen Therapy: Patient Spontanous Breathing and Patient connected to nasal cannula oxygen  Post-op Assessment: Report given to RN, Post -op Vital signs reviewed and stable and Patient moving all extremities  Post vital signs: Reviewed and stable  Last Vitals:  Vitals:   12/24/16 0749 12/24/16 0852  BP: (!) 139/47 (!) 87/33  Pulse:    Resp: 13 (!) 24  Temp: 37.3 C 36.6 C  SpO2: 100% 100%    Last Pain:  Vitals:   12/24/16 0852  TempSrc: Oral  PainSc:          Complications: No apparent anesthesia complications

## 2016-12-24 NOTE — H&P (Signed)
The patient is here today and endoscopy to have a colonoscopy because of lower GI bleed.  No distress  Heart irregular  Lungs clear  Abdomen soft nontender  Impression: Lower GI bleed  Plan: Colonoscopy

## 2016-12-24 NOTE — Progress Notes (Signed)
0645 Pt HR is 150's. Pt is AAOX4 No complaints of chest pain. Rapid response paged, Md paged. Md gave orders to give 5mg  Lopressor IV. Pt HR decreased to 60's NSR. Pt is resting comfortable in bed. Will continue to monitor.

## 2016-12-24 NOTE — Progress Notes (Signed)
eLink Physician-Brief Progress Note Patient Name: [name] DOB: 1931/10/12 MRN: [id number]   Date of Service  12/24/2016  HPI/Events of Note  H/o parox a fib on amio Adm with LGIB Developed SVT , appears regular on monitor 160s , no CP/ BP ok  eICU Interventions  Lopressor 5 X1 Back into nSR     Intervention Category Major Interventions: Arrhythmia - evaluation and management  ALVA,RAKESH V. 12/24/2016, 1:19 AM

## 2016-12-25 ENCOUNTER — Encounter (HOSPITAL_COMMUNITY): Payer: Self-pay | Admitting: Gastroenterology

## 2016-12-25 DIAGNOSIS — K5733 Diverticulitis of large intestine without perforation or abscess with bleeding: Secondary | ICD-10-CM

## 2016-12-25 DIAGNOSIS — N390 Urinary tract infection, site not specified: Secondary | ICD-10-CM

## 2016-12-25 DIAGNOSIS — I700 Atherosclerosis of aorta: Secondary | ICD-10-CM

## 2016-12-25 HISTORY — DX: Atherosclerosis of aorta: I70.0

## 2016-12-25 LAB — BASIC METABOLIC PANEL
ANION GAP: 5 (ref 5–15)
BUN: 19 mg/dL (ref 6–20)
CALCIUM: 8.2 mg/dL — AB (ref 8.9–10.3)
CO2: 26 mmol/L (ref 22–32)
CREATININE: 2.15 mg/dL — AB (ref 0.44–1.00)
Chloride: 110 mmol/L (ref 101–111)
GFR, EST AFRICAN AMERICAN: 23 mL/min — AB (ref >60)
GFR, EST NON AFRICAN AMERICAN: 20 mL/min — AB (ref >60)
Glucose, Bld: 96 mg/dL (ref 65–99)
Potassium: 4.3 mmol/L (ref 3.5–5.1)
SODIUM: 141 mmol/L (ref 135–145)

## 2016-12-25 LAB — CBC
HCT: 27 % — ABNORMAL LOW (ref 36.0–46.0)
HCT: 24.7 % — ABNORMAL LOW (ref 36.0–46.0)
HCT: 26.2 % — ABNORMAL LOW (ref 36.0–46.0)
HEMOGLOBIN: 7.6 g/dL — AB (ref 12.0–15.0)
HEMOGLOBIN: 7.9 g/dL — AB (ref 12.0–15.0)
Hemoglobin: 8.3 g/dL — ABNORMAL LOW (ref 12.0–15.0)
MCH: 27.9 pg (ref 26.0–34.0)
MCH: 27.9 pg (ref 26.0–34.0)
MCH: 27.6 pg (ref 26.0–34.0)
MCHC: 30.7 g/dL (ref 30.0–36.0)
MCHC: 30.8 g/dL (ref 30.0–36.0)
MCHC: 30.2 g/dL (ref 30.0–36.0)
MCV: 90.9 fL (ref 78.0–100.0)
MCV: 90.8 fL (ref 78.0–100.0)
MCV: 91.6 fL (ref 78.0–100.0)
PLATELETS: 207 10*3/uL (ref 150–400)
PLATELETS: 188 10*3/uL (ref 150–400)
Platelets: 186 10*3/uL (ref 150–400)
RBC: 2.97 MIL/uL — ABNORMAL LOW (ref 3.87–5.11)
RBC: 2.72 MIL/uL — AB (ref 3.87–5.11)
RBC: 2.86 MIL/uL — ABNORMAL LOW (ref 3.87–5.11)
RDW: 18.5 % — AB (ref 11.5–15.5)
RDW: 18.4 % — ABNORMAL HIGH (ref 11.5–15.5)
RDW: 18.6 % — AB (ref 11.5–15.5)
WBC: 15.5 10*3/uL — ABNORMAL HIGH (ref 4.0–10.5)
WBC: 13.4 10*3/uL — AB (ref 4.0–10.5)
WBC: 13 10*3/uL — ABNORMAL HIGH (ref 4.0–10.5)

## 2016-12-25 LAB — LACTIC ACID, PLASMA: Lactic Acid, Venous: 1.3 mmol/L (ref 0.5–1.9)

## 2016-12-25 LAB — CBC WITH DIFFERENTIAL/PLATELET
Basophils Absolute: 0 10*3/uL (ref 0.0–0.1)
Basophils Relative: 0 %
EOS ABS: 0.1 10*3/uL (ref 0.0–0.7)
EOS PCT: 1 %
HCT: 25.9 % — ABNORMAL LOW (ref 36.0–46.0)
Hemoglobin: 8 g/dL — ABNORMAL LOW (ref 12.0–15.0)
LYMPHS ABS: 3.3 10*3/uL (ref 0.7–4.0)
Lymphocytes Relative: 25 %
MCH: 28.5 pg (ref 26.0–34.0)
MCHC: 30.9 g/dL (ref 30.0–36.0)
MCV: 92.2 fL (ref 78.0–100.0)
MONO ABS: 0.9 10*3/uL (ref 0.1–1.0)
Monocytes Relative: 7 %
Neutro Abs: 8.7 10*3/uL — ABNORMAL HIGH (ref 1.7–7.7)
Neutrophils Relative %: 67 %
PLATELETS: 168 10*3/uL (ref 150–400)
RBC: 2.81 MIL/uL — AB (ref 3.87–5.11)
RDW: 19 % — AB (ref 11.5–15.5)
WBC: 13.1 10*3/uL — AB (ref 4.0–10.5)

## 2016-12-25 LAB — GLUCOSE, CAPILLARY: GLUCOSE-CAPILLARY: 92 mg/dL (ref 65–99)

## 2016-12-25 MED ORDER — METOPROLOL TARTRATE 5 MG/5ML IV SOLN
2.5000 mg | Freq: Once | INTRAVENOUS | Status: AC
  Administered 2016-12-25: 2.5 mg via INTRAVENOUS
  Filled 2016-12-25: qty 5

## 2016-12-25 MED ORDER — SODIUM CHLORIDE 0.9 % IV BOLUS (SEPSIS)
500.0000 mL | Freq: Once | INTRAVENOUS | Status: AC
Start: 2016-12-25 — End: 2016-12-26
  Administered 2016-12-25: 500 mL via INTRAVENOUS

## 2016-12-25 MED ORDER — SODIUM CHLORIDE 0.9 % IV BOLUS (SEPSIS)
1000.0000 mL | INTRAVENOUS | Status: AC
  Administered 2016-12-25: 1000 mL via INTRAVENOUS

## 2016-12-25 MED ORDER — SODIUM CHLORIDE 0.9 % IV SOLN
INTRAVENOUS | Status: DC
  Administered 2016-12-25 – 2016-12-26 (×2): via INTRAVENOUS

## 2016-12-25 NOTE — Significant Event (Addendum)
Rapid Response Event Note  Overview: While rounding on pt, RN said pt's HR-140s, BP-80s. Time Called: 2125 (While rounding on pt) Arrival Time: 2125 Event Type: Cardiac, Hypotension  Initial Focused Assessment: Pt laying in bed in no distress, c/o no pain or discomfort.  HR-140s SVT per 12 lead EKG. BP-86/56. 1L NS bolus started PTA RRT. Lab to bedside for CBC/LA.  Interventions: 1L NS bolus, 12 Lead EKG, CBC/LA, 2.5mg  Lopressor IV, continuous tele monitoring  Plan of Care (if not transferred): 2.5mg  Lopressor IV, monitor patients BP q15 minutes until hypotension resolved. Relay abnormal lab results to NP. Call RRT if further assistance needed.  Update:BP-110/56 prior to giving 2.5mg  Lopressor.  Event Summary: Name of Physician Notified: Dr. Hal Hope at  (PTA RRT)    at    Outcome: Stayed in room and stabalized  Event End Time: 2200  Dillard Essex

## 2016-12-25 NOTE — Consult Note (Signed)
[facility] CM Primary Care Navigator  12/25/2016  Heather Benson Dec 08, 1931 [id number]   Met with patient and husband Heather Benson) at the bedside to identify possible discharge needs. Patientreports having "gush of blood" from the rectum thatled to this admission. Patient endorses Dr. [name] with [facility] as theprimary care provider.   [facility] [address] [facility].   Patientstates managinghermedications at home with husband's assistance using "pill box"systemfilled weekly.   Patient's husband providestransportation to herdoctors'appointments.  Husband is the primary care provider at home as stated. Husband mentioned that he has 3 sons to assist with patient's care if needed.  Discharge plan is home per MD note.  Patientand husband voiced understanding to call primary care provider's office for a post discharge follow-up appointment within a week or sooner if needs arise.Patient letter (with PCP's contact number) was provided as theirreminder.  Explained to patient and husband about [facility] CM services available for health management at home. Both stated that they are helping each other in managing chronic illness (HF) with  weight monitoring/ recording, diet, medication and follow-up with providers when needed.  Patient had opted and verbally agreed to [place] to followup with herrecovery at home.  Referral to [place] calls made.  Patient and husband expressed understanding to [facility] primary care provider to [place] services if further management will be needed in the future.  [facility] care management information provided for future needs that may arise.   For additional questions please contact:  Edwena Felty A. Sharilynn Cassity, BSN, RN-BC [facility] PRIMARY CARE Navigator Cell: [phone]

## 2016-12-25 NOTE — Progress Notes (Signed)
PROGRESS NOTE  DANIAL SISLEY PPJ:[id number] DOB: [birth year] DOA: 12/22/2016 PCP: [name], MD  Brief Narrative: 83 year old woman PMH PAF on apixabanpresented with brisk rectal bleeding with clots.admitted for GI bleed. Transfused in ED and treated with K centra to reverse anticoagulation. Gastroenterology recommended nuclear medicine tagged red blood cell scan which was negative.subsequently underwent colonoscopy which revealed diverticulosis of the sigmoid colon and descending colon. It is suspected bleeding was diverticular in nature. Also seen was a polypoid mass at the base of the cecum that after biopsy was noted to have some purulent material.  Assessment/Plan Acute lower GI bleed secondary to diverticulosis. -no further bleeding. Hemoglobin variable.  Acute blood loss anemia, status post 1 unit PRBC transfusion on admission. -No recurrent bleeding but hemoglobin down to 8.3. May be secondary to dilution. -Repeat hemoglobin in a.m.  Leukocytosis. Present since 12/11/2016. Stable over the last several days.patient had urinary symptoms yesterday with burning and pain. She was started on antibiotics. Today she is asymptomatic. Suspect this is secondary to UTI. Abnormality of the appendix noted and colonoscopy report also noted. I discussed the CT findings and reviewed the report with Dr. Ronalee Red general surgery who does not feel that any further evaluation is warranted and does not feel the appendix is an issue at the present moment. -Treat UTI. Repeat CBC in the morning.  GNR UTI with hematuria -empiric antibiotic. Follow-up culture data.  Polypoid thickening of the cecum at the ostium of the appendix. No periappendiceal stranding, upper limits of normal in appearance. See above.  PAF on apixaban and amiodarone as an outpatient.  -anticoagulation on hold.continue amiodarone  CKD stage IV -stable. Follow clinically.  Aortic atherosclerosis -asymptomatic. Follow-up as an  outpatient.   Overall improving, repeat CBC in the morning, if continues to improve, likely discharge next 48 hours.  DVT prophylaxis: SCDs Code Status: DNR Family Communication: husband at bedside Disposition Plan: home    [name], MD  [facility] Direct contact: [phone] --[place] [place]  --[email or web address]; password [email or web address]  7PM-7AM contact night coverage as above 12/25/2016, 2:28 PM  LOS: 3 days   Consultants:  GI  Procedures:  Colonoscopy 8/14 Impression:               - One 10 mm polyp at the recto-sigmoid colon,                            removed with a hot snare. Resected and retrieved.                           - Diverticulosis in the sigmoid colon and in the                            descending colon. Suspect that the bleeding was                            diverticular. No bleeding now.                           - Polypoid mass at base of cecum. On one biopsy                            some purulent material was noted. I wonder if  this                            could be a large everted bulging appendix. I wonder                            if this could explain her elevated WBCs.  Antimicrobials:  Ciprofloxacin 8/13 >>  Interval history/Subjective: Feels poorly but no specific complaints.tolerating liquids. No abdominal pain. No bleeding.  Objective: Vitals: 100.1, 17, 91, 99/51, 94% on room air  Exam:     Constitutional: appears calm, comfortable, ill but not toxic.  Respiratory. Clear to auscultation bilaterally. No wheezes, rales or rhonchi. Normal respiratory effort.  Cardiovascular. Regular rate and rhythm. No murmur, rub or gallop. No lower extremity edema.  Abdomen obese, soft, nontender, nondistended. No right lower quadrant pain with palpation.  Psychiatric. Grossly normal mood and affect. Speech fluent and appropriate.   I have personally reviewed the following:   Labs:  WBC 15.5, no significant change. Hemoglobin has  drifted down to 8.3. Platelets within normal limits.  Imaging studies:  CT abdomen and pelvis noted. Appendix appeared uninflamed. Possible small polyp in the cecum near the appendiceal orifice. Considerable sigmoid colon diverticulosis.  Medical tests:     Test discussed with performing physician:    Decision to obtain old records:    Review and summation of old records:    Scheduled Meds: . amiodarone  200 mg Oral Daily  . budesonide  0.5 mg Inhalation BID  . latanoprost  1 drop Both Eyes QHS  . pantoprazole  40 mg Oral Q1200   Continuous Infusions: . ciprofloxacin Stopped (12/24/16 2330)    Principal Problem:   Diverticulitis of colon with hemorrhage Active Problems:   PAF (paroxysmal atrial fibrillation) (HCC)   CKD (chronic kidney disease), stage IV (HCC)   Acute lower UTI   Aortic atherosclerosis (HCC)   LOS: 3 days

## 2016-12-26 LAB — TYPE AND SCREEN
ABO/RH(D): A POS
ANTIBODY SCREEN: NEGATIVE
UNIT DIVISION: 0
UNIT DIVISION: 0
Unit division: 0

## 2016-12-26 LAB — CBC
HEMATOCRIT: 26.1 % — AB (ref 36.0–46.0)
HEMOGLOBIN: 8 g/dL — AB (ref 12.0–15.0)
MCH: 28.1 pg (ref 26.0–34.0)
MCHC: 30.7 g/dL (ref 30.0–36.0)
MCV: 91.6 fL (ref 78.0–100.0)
Platelets: 172 10*3/uL (ref 150–400)
RBC: 2.85 MIL/uL — ABNORMAL LOW (ref 3.87–5.11)
RDW: 19 % — AB (ref 11.5–15.5)
WBC: 10.9 10*3/uL — AB (ref 4.0–10.5)

## 2016-12-26 LAB — BPAM RBC
BLOOD PRODUCT EXPIRATION DATE: 201808292359
BLOOD PRODUCT EXPIRATION DATE: 201808292359
BLOOD PRODUCT EXPIRATION DATE: 201808292359
ISSUE DATE / TIME: 201808081456
ISSUE DATE / TIME: 201808120422
ISSUE DATE / TIME: 201808140503
UNIT TYPE AND RH: 6200
UNIT TYPE AND RH: 6200
UNIT TYPE AND RH: 6200

## 2016-12-26 LAB — BASIC METABOLIC PANEL
ANION GAP: 6 (ref 5–15)
BUN: 17 mg/dL (ref 6–20)
CALCIUM: 7.9 mg/dL — AB (ref 8.9–10.3)
CO2: 23 mmol/L (ref 22–32)
Chloride: 113 mmol/L — ABNORMAL HIGH (ref 101–111)
Creatinine, Ser: 2.26 mg/dL — ABNORMAL HIGH (ref 0.44–1.00)
GFR, EST AFRICAN AMERICAN: 22 mL/min — AB (ref >60)
GFR, EST NON AFRICAN AMERICAN: 19 mL/min — AB (ref >60)
Glucose, Bld: 103 mg/dL — ABNORMAL HIGH (ref 65–99)
Potassium: 4.1 mmol/L (ref 3.5–5.1)
Sodium: 142 mmol/L (ref 135–145)

## 2016-12-26 LAB — LACTIC ACID, PLASMA: Lactic Acid, Venous: 1 mmol/L (ref 0.5–1.9)

## 2016-12-26 NOTE — Progress Notes (Signed)
Patient ID: Heather Benson, female   DOB: 30-Sep-1931, 83 y.o.   MRN: [id number]    PROGRESS NOTE    Heather Benson  RAQ:[id number] DOB: April 15, 1932 DOA: 12/22/2016  PCP: Heather Stains, MD   Brief Narrative:  83 year old woman PMH PAF on apixabanpresented with brisk rectal bleeding with clots.admitted for GI bleed. Transfused in ED and treated with K centra to reverse anticoagulation. Gastroenterology recommended nuclear medicine tagged red blood cell scan which was negative.subsequently underwent colonoscopy which revealed diverticulosis of the sigmoid colon and descending colon. It is suspected bleeding was diverticular in nature. Also seen was a polypoid mass at the base of the cecum that after biopsy was noted to have some purulent material.  Assessment/Plan Acute lower GI bleed secondary to diverticulosis, acute blood loss anemia  - no further bleeding - pt is s/p 1 U PRBC, Hg overall stable  - GI team signed off - CBC in AM  Leukocytosis.secondary to UTI, salmonella species  - on Cipro   GNR UTI with hematuria, salmonella species  - on empiric Cipro as noted above   Polypoid thickening of the cecum at the ostium of the appendix.  - No periappendiceal stranding, upper limits of normal in appearance.  PAF on apixaban and amiodarone as an outpatient.  - AC still on hold, can resume if GI team ok with that   CKD stage IV - overall stable - BMP in AM  Aortic atherosclerosis - asymptomatic. Follow-up as an outpatient  SVT - overnight but spont converted to NSR with dose of beta blocker - monitor - pt asymptomatic this AM   DVT prophylaxis: SCD's Code Status: DNR Family Communication: Patient at bedside, husband at bedside  Disposition Plan: to be determined   Consultants:   GI  Procedures:   Colonoscopy 8/14 - One 10 mm polyp at the recto-sigmoid colon,  removed with a hot snare. Resected and  retrieved. - Diverticulosis in the sigmoid colon and in the  descending colon. Suspect that the bleeding was  diverticular. No bleeding now. - Polypoid mass at base of cecum. On one biopsy  some purulent material was noted. I wonder if this  could be a large everted bulging appendix.  Antimicrobials:   Cipro 8/13 -->   Subjective: Had SVT overnight but feels better this AM.   Objective: Vitals:   12/26/16 0502 12/26/16 0505 12/26/16 0944 12/26/16 1450  BP:    (!) 106/50  Pulse: (!) 133 65  75  Resp:      Temp:    98.5 F (36.9 C)  TempSrc:    Oral  SpO2:   98% 96%  Weight:      Height:        Intake/Output Summary (Last 24 hours) at 12/26/16 1656 Last data filed at 12/26/16 1013  Gross per 24 hour  Intake            917.5 ml  Output                0 ml  Net            917.5 ml   Filed Weights   12/22/16 0833 12/24/16 0749  Weight: 84.6 kg (186 lb 8 oz) 84.4 kg (186 lb)    Examination:  General exam: Appears calm and comfortable  Respiratory system:  Respiratory effort normal. Cardiovascular system: S1 & S2 heard, No JVD, rubs, gallops or clicks.  Gastrointestinal system: Abdomen is nondistended, soft and nontender. No organomegaly or masses  felt.  Central nervous system: Alert and oriented. No focal neurological deficits.  Data Reviewed: I have personally reviewed following labs and imaging studies  CBC:  Recent Labs Lab 12/22/16 0230  12/23/16 0754  12/24/16 1224 12/24/16 1852 12/25/16 0856 12/25/16 1452 12/25/16 1843 12/25/16 2131 12/26/16 0524  WBC 18.3*  < > 23.6*  < > 12.4* 15.3* 15.5* 13.4* 13.0* 13.1* 10.9*  NEUTROABS 12.3*  --  18.9*  --  9.3* 11.1*  --   --   --  8.7*  --   HGB 13.7  < > 9.0*  < > 7.5* 9.0* 8.3* 7.6* 7.9* 8.0* 8.0*  HCT 45.3  < > 29.2*  < > 23.7* 28.7* 27.0* 24.7* 26.2*  25.9* 26.1*  MCV 89.2  < > 88.8  < > 88.8 90.5 90.9 90.8 91.6 92.2 91.6  PLT 349  < > 244  < > 171 204 207 186 188 168 172  < > = values in this interval not displayed. Basic Metabolic Panel:  Recent Labs Lab 12/22/16 1402 12/23/16 0754 12/24/16 0204 12/25/16 0242 12/26/16 0524  NA 143 142 142 141 142  K 4.8 5.1 4.0 4.3 4.1  CL 107 109 111 110 113*  CO2 25 24 25 26 23   GLUCOSE 173* 140* 120* 96 103*  BUN 37* 42* 30* 19 17  CREATININE 2.39* 2.73* 2.31* 2.15* 2.26*  CALCIUM 8.3* 8.3* 7.9* 8.2* 7.9*  MG 2.3 2.1  --   --   --    Liver Function Tests:  Recent Labs Lab 12/22/16 0230  AST 23  ALT 20  ALKPHOS 47  BILITOT 0.7  PROT 6.4*  ALBUMIN 3.1*   Coagulation Profile:  Recent Labs Lab 12/22/16 0230  INR 1.11   Cardiac Enzymes:  Recent Labs Lab 12/24/16 0204  TROPONINI 0.06*   CBG:  Recent Labs Lab 12/25/16 0102  GLUCAP 92   Urine analysis:    Component Value Date/Time   COLORURINE YELLOW 12/23/2016 1546   APPEARANCEUR TURBID (A) 12/23/2016 1546   LABSPEC 1.025 12/23/2016 1546   PHURINE 5.5 12/23/2016 1546   GLUCOSEU 250 (A) 12/23/2016 1546   HGBUR LARGE (A) 12/23/2016 1546   BILIRUBINUR NEGATIVE 12/23/2016 1546   KETONESUR NEGATIVE 12/23/2016 1546   PROTEINUR 100 (A) 12/23/2016 1546   UROBILINOGEN 0.2 02/05/2011 0915   NITRITE POSITIVE (A) 12/23/2016 1546   LEUKOCYTESUR MODERATE (A) 12/23/2016 1546   Recent Results (from the past 240 hour(s))  MRSA PCR Screening     Status: None   Collection Time: 12/22/16 10:48 AM  Result Value Ref Range Status   MRSA by PCR NEGATIVE NEGATIVE Final    Comment:        The GeneXpert MRSA Assay (FDA approved for NASAL specimens only), is one component of a comprehensive MRSA colonization surveillance program. It is not intended to diagnose MRSA infection nor to guide or monitor treatment for MRSA infections.   Culture, Urine     Status: Abnormal (Preliminary result)   Collection Time: 12/23/16  3:46  PM  Result Value Ref Range Status   Specimen Description URINE, CLEAN CATCH  Final   Special Requests NONE  Final   Culture >=100,000 COLONIES/mL SALMONELLA SPECIES (A)  Final   Report Status PENDING  Incomplete   Organism ID, Bacteria SALMONELLA SPECIES (A)  Final      Susceptibility   Salmonella species - MIC*    AMPICILLIN <=2 SENSITIVE Sensitive     LEVOFLOXACIN <=0.12 SENSITIVE Sensitive  TRIMETH/SULFA <=20 SENSITIVE Sensitive     * >=100,000 COLONIES/mL SALMONELLA SPECIES     Radiology Studies: No results found.   Scheduled Meds: . amiodarone  200 mg Oral Daily  . budesonide  0.5 mg Inhalation BID  . latanoprost  1 drop Both Eyes QHS  . pantoprazole  40 mg Oral Q1200   Continuous Infusions: . sodium chloride 75 mL/hr at 12/26/16 1114  . ciprofloxacin Stopped (12/25/16 2133)     LOS: 4 days   Time spent: 25 minutes   Faye Ramsay, MD [facility] Pager [phone]  If 7PM-7AM, please contact night-coverage [email or web address] Password [facility] 12/26/2016, 4:56 PM

## 2016-12-26 NOTE — Progress Notes (Signed)
Patient's Hgb 7.9. Orders to notify physician if Hgb < 8.0. Notified Kakrakandy. No further orders at this time. Patient and patient's husband requested solid food diet. Kakrakandy ordered soft diet.

## 2016-12-26 NOTE — Progress Notes (Signed)
No further bleeding. No signs of appendicitis Bx of cecum- no adenoma or inflamation  We will sign off. Call if needed.

## 2016-12-26 NOTE — Progress Notes (Signed)
Shift event note: Notified by my colleague, Dr Hal Hope pt w/ increased HR (> 140's) and soft BP. He placed order for 1L NS IVFB, CBC and lactate. Record reviewed. Spoke w/ RN by phone for update and was informed HR in 140's, EKG reveals SVT w/ HR of 149. BP 86/56 and pt asymptomatic. 1L NS IVFB infusing. Order placed for Lopressor 2.5 mg IV now. Pt known to me previously d/t episodes of SVT (140-170's) x 2 on 12/24/2016 (while in SDU awaiting colonoscopy) that responded to IV Lopressor. Was unable to respond to bedside immediately but upon my arrival to bedside HR in the 60's (NSR) and pt noted to be sleeping. BP 113/58 and remaining VSS. Lactate 1.3 and CBC essentially unchanged since this am.  Assessment/Plan: 1. SVT: Converted to NSR w/ rate in the 60-70's after small dose of  IV Lopressor. Pt remained asymptomatic through-out. 2. Hypotension: This has been an intermittent issue for pt. Much improved after IL NS IVFB. Will continue to monitor closely on telemetry.   Jeryl Columbia, NP-C [facility] Pager [phone]

## 2016-12-26 NOTE — Progress Notes (Signed)
Schorr notified by Hal Hope to come assess patient. Spoke with Government social research officer via telephone; ordered 2.5 mg IV metoprolol.

## 2016-12-26 NOTE — Progress Notes (Signed)
Patient's HR 66, BP 98/47. NSR according to cardiac monitoring. Schorr notified of HR and BP; ordered 500cc bolus and IV maintenance fluid of NS @ 54ml/hr.

## 2016-12-26 NOTE — Progress Notes (Signed)
Patient's heart rate 148 apically, BP 81/51 and BP 86/56. Notified Kakrakandy. Pt has no c/o pain, distress, dizziness, lightheadedness. Pt stated she did not feel like her heart was racing or beating fast. Kakrakandy ordered 1L NS bolus, stat EKG, stat CBC and lactic acid, and for patient to be placed on cardiac monitoring.

## 2016-12-26 NOTE — Evaluation (Signed)
Physical Therapy Evaluation Patient Details Name: Heather Benson MRN: [id number] DOB: 05/01/1932 Today's Date: 12/26/2016   History of Present Illness  83 year old woman PMH PAF on apixabanpresented with brisk rectal bleeding with clots.admitted for GI bleed.  Colonoscopy revealed sigmod and descending colon diverticulosis.  Developed SVT and treated with Lopressor and converted to NSR.  Clinical Impression  Patient presents with decreased independence with mobility due to prolonged bedrest and weakness associated with medical issues.  She will benefit from skilled PT in the acute setting to allow return home following SNF level rehab stay.  Spouse reports prefers [facility] in [place].      Follow Up Recommendations SNF    Equipment Recommendations  None recommended by PT    Recommendations for Other Services       Precautions / Restrictions Precautions Precautions: Fall Restrictions Weight Bearing Restrictions: No      Mobility  Bed Mobility Overal bed mobility: Needs Assistance Bed Mobility: Sit to Supine       Sit to supine: Mod assist   General bed mobility comments: assist for both legs into bed  Transfers Overall transfer level: Needs assistance Equipment used: Rolling walker (2 wheeled) Transfers: Sit to/from Stand Sit to Stand: Mod assist         General transfer comment: lifting help, cues for hand placement  Ambulation/Gait Ambulation/Gait assistance: Min assist;Mod assist Ambulation Distance (Feet): 8 Feet Assistive device: Rolling walker (2 wheeled) Gait Pattern/deviations: Step-to pattern;Decreased stride length     General Gait Details: pt fatigued and weak and unable to walk further, went 4' forward and 4' back  Stairs            Wheelchair Mobility    Modified Rankin (Stroke Patients Only)       Balance Overall balance assessment: Needs assistance   Sitting balance-Leahy Scale: Fair     Standing balance support:  Bilateral upper extremity supported Standing balance-Leahy Scale: Poor Standing balance comment: UE A for balance                             Pertinent Vitals/Pain Pain Assessment: No/denies pain    Home Living Family/patient expects to be discharged to:: Private residence Living Arrangements: Spouse/significant other Available Help at Discharge: Family Type of Home: House Home Access: Stairs to enter   Technical brewer of Steps: 1 Home Layout: One level Home Equipment: Environmental consultant - 2 wheels;Cane - single point;Hand held shower head      Prior Function Level of Independence: Needs assistance   Gait / Transfers Assistance Needed: independent with ambulation with cane and ADL's, but spouse does cooking/cleaning           Hand Dominance        Extremity/Trunk Assessment   Upper Extremity Assessment Upper Extremity Assessment: Generalized weakness    Lower Extremity Assessment Lower Extremity Assessment: Generalized weakness    Cervical / Trunk Assessment Cervical / Trunk Assessment: Kyphotic  Communication   Communication: No difficulties  Cognition Arousal/Alertness: Awake/alert Behavior During Therapy: WFL for tasks assessed/performed Overall Cognitive Status: Within Functional Limits for tasks assessed                                        General Comments General comments (skin integrity, edema, etc.): needed assist for hygiene; was incontinent from bed to [facility] and needed assist even  with spouse A    Exercises     Assessment/Plan    PT Assessment Patient needs continued PT services  PT Problem List Decreased strength;Decreased activity tolerance;Decreased balance;Decreased knowledge of use of DME;Decreased mobility       PT Treatment Interventions DME instruction;Gait training;Patient/family education;Therapeutic activities;Therapeutic exercise;Stair training;Functional mobility training;Balance training    PT Goals  (Current goals can be found in the Care Plan section)  Acute Rehab PT Goals Patient Stated Goal: To go home PT Goal Formulation: With patient/family Time For Goal Achievement: 01/02/17 Potential to Achieve Goals: Good    Frequency Min 3X/week   Barriers to discharge        Co-evaluation               AM-PAC PT "6 Clicks" Daily Activity  Outcome Measure Difficulty turning over in bed (including adjusting bedclothes, sheets and blankets)?: A Little Difficulty moving from lying on back to sitting on the side of the bed? : Total Difficulty sitting down on and standing up from a chair with arms (e.g., wheelchair, bedside commode, etc,.)?: Total Help needed moving to and from a bed to chair (including a wheelchair)?: A Lot Help needed walking in hospital room?: A Lot Help needed climbing 3-5 steps with a railing? : Total 6 Click Score: 10    End of Session   Activity Tolerance: Patient limited by fatigue Patient left: in bed;with call bell/phone within reach;with family/visitor present   PT Visit Diagnosis: Muscle weakness (generalized) (M62.81);Other abnormalities of gait and mobility (R26.89)    Time: 5409-8119 PT Time Calculation (min) (ACUTE ONLY): 28 min   Charges:   PT Evaluation $PT Eval Moderate Complexity: 1 Mod PT Treatments $Gait Training: 8-22 mins   PT G CodesMagda Kiel, [place] [phone] 12/26/2016   Reginia Naas 12/26/2016, 5:43 PM

## 2016-12-26 NOTE — Care Management Important Message (Signed)
Important Message  Patient Details  Name: Heather Benson MRN: [id number] Date of Birth: [birth year]   Medicare Important Message Given:  Yes    Christain Niznik [address] 12/26/2016, 11:45 AM

## 2016-12-27 DIAGNOSIS — I700 Atherosclerosis of aorta: Secondary | ICD-10-CM

## 2016-12-27 DIAGNOSIS — K625 Hemorrhage of anus and rectum: Secondary | ICD-10-CM

## 2016-12-27 DIAGNOSIS — I480 Paroxysmal atrial fibrillation: Secondary | ICD-10-CM

## 2016-12-27 DIAGNOSIS — K5733 Diverticulitis of large intestine without perforation or abscess with bleeding: Principal | ICD-10-CM

## 2016-12-27 DIAGNOSIS — N184 Chronic kidney disease, stage 4 (severe): Secondary | ICD-10-CM

## 2016-12-27 LAB — BASIC METABOLIC PANEL
Anion gap: 4 — ABNORMAL LOW (ref 5–15)
BUN: 14 mg/dL (ref 6–20)
CHLORIDE: 111 mmol/L (ref 101–111)
CO2: 24 mmol/L (ref 22–32)
Calcium: 7.9 mg/dL — ABNORMAL LOW (ref 8.9–10.3)
Creatinine, Ser: 2.02 mg/dL — ABNORMAL HIGH (ref 0.44–1.00)
GFR calc Af Amer: 25 mL/min — ABNORMAL LOW (ref >60)
GFR calc non Af Amer: 22 mL/min — ABNORMAL LOW (ref >60)
GLUCOSE: 111 mg/dL — AB (ref 65–99)
POTASSIUM: 3.8 mmol/L (ref 3.5–5.1)
Sodium: 139 mmol/L (ref 135–145)

## 2016-12-27 LAB — CBC
HEMATOCRIT: 26.6 % — AB (ref 36.0–46.0)
HEMOGLOBIN: 8.2 g/dL — AB (ref 12.0–15.0)
MCH: 28.2 pg (ref 26.0–34.0)
MCHC: 30.8 g/dL (ref 30.0–36.0)
MCV: 91.4 fL (ref 78.0–100.0)
Platelets: 183 10*3/uL (ref 150–400)
RBC: 2.91 MIL/uL — AB (ref 3.87–5.11)
RDW: 19 % — ABNORMAL HIGH (ref 11.5–15.5)
WBC: 12.5 10*3/uL — ABNORMAL HIGH (ref 4.0–10.5)

## 2016-12-27 MED ORDER — LEVOFLOXACIN IN D5W 500 MG/100ML IV SOLN
500.0000 mg | INTRAVENOUS | Status: DC
  Administered 2016-12-27: 500 mg via INTRAVENOUS
  Filled 2016-12-27 (×2): qty 100

## 2016-12-27 MED ORDER — METOPROLOL TARTRATE 5 MG/5ML IV SOLN
2.5000 mg | Freq: Once | INTRAVENOUS | Status: AC
  Administered 2016-12-27: 2.5 mg via INTRAVENOUS
  Filled 2016-12-27: qty 5

## 2016-12-27 MED ORDER — SODIUM CHLORIDE 0.9 % IV BOLUS (SEPSIS)
500.0000 mL | Freq: Once | INTRAVENOUS | Status: AC
  Administered 2016-12-27: 500 mL via INTRAVENOUS

## 2016-12-27 MED ORDER — METOPROLOL TARTRATE 12.5 MG HALF TABLET
12.5000 mg | ORAL_TABLET | Freq: Two times a day (BID) | ORAL | Status: DC
  Administered 2016-12-28 – 2016-12-30 (×5): 12.5 mg via ORAL
  Filled 2016-12-27 (×6): qty 1

## 2016-12-27 MED ORDER — TRAMADOL HCL 50 MG PO TABS: 50.0000 mg | ORAL_TABLET | Freq: Two times a day (BID) | ORAL | Status: DC | PRN

## 2016-12-27 NOTE — Care Management Note (Signed)
Case Management Note  Patient Details  Name: Heather Benson MRN: [id number] Date of Birth: [birth year]  Subjective/Objective:                    Action/Plan:  Prefers [facility] in [place] Expected Discharge Date:                  Expected Discharge Plan:  [place]  In-House Referral:  Clinical Social Work  Discharge planning Services     Post Acute Care Choice:    Choice offered to:     DME Arranged:    DME Agency:     HH Arranged:    [place] Agency:     Status of Service:  In process, will continue to follow  If discussed at Long Length of Stay Meetings, dates discussed:    Additional Comments:  Marilu Favre, RN 12/27/2016, 11:21 AM

## 2016-12-27 NOTE — Progress Notes (Signed)
   12/27/16 1100  Vitals  BP (!) 80/60  BP Location Left Arm  BP Method Automatic  Patient Position (if appropriate) Lying  Pulse Rate (!) 157  Pulse Rate Source [place]  Resp 18  Oxygen Therapy  SpO2 97 %  O2 Device Room Air  Received a call from central monitoring that pt HR was in the 150s. Vitals checked and recorded as above. MD paged

## 2016-12-27 NOTE — NC FL2 (Signed)
[place] LEVEL OF CARE SCREENING TOOL     IDENTIFICATION  Patient Name: TOKIKO DIEFENDERFER Birthdate: Apr 09, 1932 Sex: female Admission Date (Current Location): 12/22/2016  [facility] and [place] Number:  Herbalist and Address:  [facility] [place], [place] [address] [place], [place] [address]      Provider Number: [id number]  Attending Physician Name and Address:  Theodis Blaze, MD  Relative Name and Phone Number:  Amarianna, Abplanalp [phone]    Current Level of Care: Hospital Recommended Level of Care: [place] Prior Approval Number:    Date Approved/Denied:   PASRR Number: [id number]  Discharge Plan: SNF    Current Diagnoses: Patient Active Problem List   Diagnosis Date Noted  . Diverticulitis of colon with hemorrhage 12/25/2016  . Acute lower UTI 12/25/2016  . Aortic atherosclerosis ([place]) 12/25/2016  . Acute diastolic CHF (congestive heart failure) ([place])   . CAP (community acquired pneumonia) 12/13/2015  . Elevated troponin 12/13/2015  . Anemia in chronic renal disease 12/13/2015  . Hyponatremia 12/13/2015  . DOE (dyspnea on exertion) 03/06/2015  . Bradycardia 08/23/2014  . Essential hypertension 08/23/2014  . Lightheadedness 08/23/2014  . Fatigue 06/22/2014  . PAF (paroxysmal atrial fibrillation) ([place]) 01/27/2014  . CKD (chronic kidney disease), stage IV ([place]) 01/27/2014  . Swelling of limb 04/01/2012  . Edema leg 04/01/2012    Orientation RESPIRATION BLADDER Height & Weight     Self, Time, Situation, Place  Normal Incontinent (intermittent) Weight: 186 lb (84.4 kg) Height:  5\' 5"  (165.1 cm)  BEHAVIORAL SYMPTOMS/MOOD NEUROLOGICAL BOWEL NUTRITION STATUS      Incontinent (intermittent) Diet (please see DC summary)  AMBULATORY STATUS COMMUNICATION OF NEEDS Skin   Limited Assist Verbally Normal                       Personal Care Assistance Level of Assistance  Bathing, Feeding, Dressing  Bathing Assistance: Limited assistance Feeding assistance: Independent Dressing Assistance: Limited assistance     Functional Limitations Info  Sight, Hearing, Speech Sight Info: Adequate Hearing Info: Adequate Speech Info: Adequate    SPECIAL CARE FACTORS FREQUENCY  PT (By licensed PT)     PT Frequency: 5x/week              Contractures      Additional Factors Info  Code Status, Allergies Code Status Info: DNR Allergies Info: Augmentin Amoxicillin-pot Clavulanate, Boniva Ibandronic Acid, Hydromorphone, Latex, Lisinopril, Codeine, Tape           Current Medications (12/27/2016):  This is the current hospital active medication list Current Facility-Administered Medications  Medication Dose Route Frequency Provider Last Rate Last Dose  . acetaminophen (TYLENOL) tablet 650 mg  650 mg Oral Q6H PRN Cherene Altes, MD   650 mg at 12/27/16 1416  . amiodarone (PACERONE) tablet 200 mg  200 mg Oral Daily Jennette Kettle M, DO   200 mg at 12/27/16 1048  . budesonide (PULMICORT) nebulizer solution 0.5 mg  0.5 mg Inhalation BID Jennette Kettle M, DO   0.5 mg at 12/27/16 8938  . fluticasone (FLONASE) 50 MCG/ACT nasal spray 1 spray  1 spray Each Nare Daily PRN Cherene Altes, MD      . guaiFENesin (MUCINEX) 12 hr tablet 600 mg  600 mg Oral BID PRN Cherene Altes, MD      . latanoprost (XALATAN) 0.005 % ophthalmic solution 1 drop  1 drop Both Eyes QHS Jennette Kettle  M, DO   1 drop at 12/26/16 2211  . levofloxacin (LEVAQUIN) IVPB 500 mg  500 mg Intravenous Q48H Reginia Naas, RPH   Stopped at 12/27/16 1253  . meclizine (ANTIVERT) tablet 12.5 mg  12.5 mg Oral TID PRN Cherene Altes, MD      . ondansetron [facility]) tablet 4 mg  4 mg Oral Q6H PRN Etta Quill, DO       Or  . ondansetron [facility]) injection 4 mg  4 mg Intravenous Q6H PRN Etta Quill, DO   4 mg at 12/24/16 6644  . pantoprazole (PROTONIX) EC tablet 40 mg  40 mg Oral Q1200 Cherene Altes, MD   40  mg at 12/27/16 1220  . traMADol (ULTRAM) tablet 50 mg  50 mg Oral Q12H PRN Theodis Blaze, MD         Discharge Medications: Please see discharge summary for a list of discharge medications.  Relevant Imaging Results:  Relevant Lab Results:   Additional Information SSN: [id number]  Estanislado Emms, LCSW

## 2016-12-27 NOTE — Progress Notes (Signed)
Patient ID: Heather Benson, female   DOB: 19-Mar-1932, 83 y.o.   MRN: [id number]    PROGRESS NOTE  Heather Benson  DXI:[id number] DOB: [birth year] DOA: 12/22/2016  PCP: [name], MD   Brief Narrative:  83 year old woman PMH PAF on apixabanpresented with brisk rectal bleeding with clots.admitted for GI bleed. Transfused in ED and treated with K centra to reverse anticoagulation. Gastroenterology recommended nuclear medicine tagged red blood cell scan which was negative.subsequently underwent colonoscopy which revealed diverticulosis of the sigmoid colon and descending colon. It is suspected bleeding was diverticular in nature. Also seen was a polypoid mass at the base of the cecum that after biopsy was noted to have some purulent material.  Assessment/Plan Acute lower GI bleed secondary to diverticulosis, acute blood loss anemia  - no further bleeding - pt has been transfused 1 U PRBC and Hg is overall stable - GI team has signed off - CBC in AM - can likely resume AC in next few days   Leukocytosis.secondary to UTI, salmonella and E. Coli species  - pt has been on Cipro but now WBC is up this AM - sensitivity report indicates susceptibility of the two species to Levaquin - will change Cipro to Levaquin today   GNR UTI with hematuria, salmonella and E. Coli species  - change Cipro to Levaquin   Polypoid thickening of the cecum at the ostium of the appendix.  - No periappendiceal stranding, upper limits of normal in appearance.  PAF on apixaban and amiodarone as an outpatient.  - AC on hold, resume in several days if GI team ok  CKD stage IV - Cr overall stable, trending down - BMP in AM  Aortic atherosclerosis - asymptomatic   SVT - overnight 8/15 to 8/16, but spont converted to NSR with dose of beta blocker - in NSR this AM   Obesity  - Body mass index is 30.95 kg/m.  DVT prophylaxis: SCD's Code Status: DNR Family Communication: husband at bedside    Disposition Plan: SNF recommended, SW consulted for assistance   Consultants:   GI  Procedures:   Colonoscopy 8/14 - One 10 mm polyp at the recto-sigmoid colon, removed with a hot snare. Resected and retrieved. Diverticulosis in the sigmoid colon and in the descending colon. Suspect that the bleeding was diverticular. No bleeding now. Polypoid mass at base of cecum. On one biopsysome purulent material was noted. ? if this could be a large everted bulging appendix.  Antimicrobials:   Cipro 8/13 --> 8/17  Levaquin 8/17 -->  Subjective: Reports feeling better this AM.   Objective: Vitals:   12/26/16 1450 12/26/16 2232 12/27/16 0527 12/27/16 0938  BP: (!) 106/50 (!) 121/55 (!) 125/56   Pulse: 75 90 88   Resp:  16 16   Temp: 98.5 F (36.9 C) 99.7 F (37.6 C) 99.3 F (37.4 C)   TempSrc: Oral Oral Oral   SpO2: 96% 96% 96% 93%  Weight:      Height:        Intake/Output Summary (Last 24 hours) at 12/27/16 0958 Last data filed at 12/27/16 0913  Gross per 24 hour  Intake              620 ml  Output                1 ml  Net              619 ml   Filed Weights   12/22/16 0833 12/24/16  0749  Weight: 84.6 kg (186 lb 8 oz) 84.4 kg (186 lb)    Physical Exam  Constitutional: Appears well-developed and well-nourished. No distress.  CVS: RRR, S1/S2 +, no murmurs, no gallops, no carotid bruit.  Pulmonary: Effort and breath sounds normal, no stridor, rhonchi, wheezes, rales.  Abdominal: Soft. BS +,  no distension, tenderness, rebound or guarding.  Lymphadenopathy: No lymphadenopathy noted, cervical, inguinal. Neuro: Alert. Normal reflexes, muscle tone coordination.   Data Reviewed: I have personally reviewed following labs and imaging studies  CBC:  Recent Labs Lab 12/22/16 0230  12/23/16 0754  12/24/16 1224 12/24/16 1852  12/25/16 1452 12/25/16 1843 12/25/16 2131 12/26/16 0524 12/27/16 0645  WBC 18.3*  < > 23.6*  < > 12.4* 15.3*  < > 13.4* 13.0* 13.1* 10.9* 12.5*   NEUTROABS 12.3*  --  18.9*  --  9.3* 11.1*  --   --   --  8.7*  --   --   HGB 13.7  < > 9.0*  < > 7.5* 9.0*  < > 7.6* 7.9* 8.0* 8.0* 8.2*  HCT 45.3  < > 29.2*  < > 23.7* 28.7*  < > 24.7* 26.2* 25.9* 26.1* 26.6*  MCV 89.2  < > 88.8  < > 88.8 90.5  < > 90.8 91.6 92.2 91.6 91.4  PLT 349  < > 244  < > 171 204  < > 186 188 168 172 183  < > = values in this interval not displayed. Basic Metabolic Panel:  Recent Labs Lab 12/22/16 1402 12/23/16 0754 12/24/16 0204 12/25/16 0242 12/26/16 0524 12/27/16 0645  NA 143 142 142 141 142 139  K 4.8 5.1 4.0 4.3 4.1 3.8  CL 107 109 111 110 113* 111  CO2 25 24 25 26 23 24   GLUCOSE 173* 140* 120* 96 103* 111*  BUN 37* 42* 30* 19 17 14   CREATININE 2.39* 2.73* 2.31* 2.15* 2.26* 2.02*  CALCIUM 8.3* 8.3* 7.9* 8.2* 7.9* 7.9*  MG 2.3 2.1  --   --   --   --    Liver Function Tests:  Recent Labs Lab 12/22/16 0230  AST 23  ALT 20  ALKPHOS 47  BILITOT 0.7  PROT 6.4*  ALBUMIN 3.1*   Coagulation Profile:  Recent Labs Lab 12/22/16 0230  INR 1.11   Cardiac Enzymes:  Recent Labs Lab 12/24/16 0204  TROPONINI 0.06*   CBG:  Recent Labs Lab 12/25/16 0102  GLUCAP 92   Urine analysis:    Component Value Date/Time   COLORURINE YELLOW 12/23/2016 1546   APPEARANCEUR TURBID (A) 12/23/2016 1546   LABSPEC 1.025 12/23/2016 1546   PHURINE 5.5 12/23/2016 1546   GLUCOSEU 250 (A) 12/23/2016 1546   HGBUR LARGE (A) 12/23/2016 1546   BILIRUBINUR NEGATIVE 12/23/2016 1546   KETONESUR NEGATIVE 12/23/2016 1546   PROTEINUR 100 (A) 12/23/2016 1546   UROBILINOGEN 0.2 02/05/2011 0915   NITRITE POSITIVE (A) 12/23/2016 1546   LEUKOCYTESUR MODERATE (A) 12/23/2016 1546   Recent Results (from the past 240 hour(s))  MRSA PCR Screening     Status: None   Collection Time: 12/22/16 10:48 AM  Result Value Ref Range Status   MRSA by PCR NEGATIVE NEGATIVE Final    Comment:        The GeneXpert MRSA Assay (FDA approved for NASAL specimens only), is one  component of a comprehensive MRSA colonization surveillance program. It is not intended to diagnose MRSA infection nor to guide or monitor treatment for MRSA infections.  Culture, Urine     Status: Abnormal (Preliminary result)   Collection Time: 12/23/16  3:46 PM  Result Value Ref Range Status   Specimen Description URINE, CLEAN CATCH  Final   Special Requests NONE  Final   Culture (A)  Final    >=100,000 COLONIES/mL SALMONELLA SPECIES 50,000 COLONIES/mL ESCHERICHIA COLI Referred to [facility] in [place], [place] for Serotyping. HEALTH DEPARTMENT NOTIFIED    Report Status PENDING  Incomplete   Organism ID, Bacteria SALMONELLA SPECIES (A)  Final   Organism ID, Bacteria ESCHERICHIA COLI (A)  Final      Susceptibility   Escherichia coli - MIC*    AMPICILLIN 4 SENSITIVE Sensitive     CEFAZOLIN <=4 SENSITIVE Sensitive     CEFTRIAXONE <=1 SENSITIVE Sensitive     CIPROFLOXACIN <=0.25 SENSITIVE Sensitive     GENTAMICIN <=1 SENSITIVE Sensitive     IMIPENEM <=0.25 SENSITIVE Sensitive     NITROFURANTOIN <=16 SENSITIVE Sensitive     TRIMETH/SULFA <=20 SENSITIVE Sensitive     AMPICILLIN/SULBACTAM <=2 SENSITIVE Sensitive     PIP/TAZO <=4 SENSITIVE Sensitive     Extended ESBL NEGATIVE Sensitive     * 50,000 COLONIES/mL ESCHERICHIA COLI   Salmonella species - MIC*    AMPICILLIN <=2 SENSITIVE Sensitive     LEVOFLOXACIN <=0.12 SENSITIVE Sensitive     TRIMETH/SULFA <=20 SENSITIVE Sensitive     * >=100,000 COLONIES/mL SALMONELLA SPECIES    Radiology Studies: No results found.   Scheduled Meds: . amiodarone  200 mg Oral Daily  . budesonide  0.5 mg Inhalation BID  . latanoprost  1 drop Both Eyes QHS  . pantoprazole  40 mg Oral Q1200   Continuous Infusions: . ciprofloxacin Stopped (12/26/16 2304)     LOS: 5 days   Time spent: 25 minutes   Faye Ramsay, MD [facility] Pager [phone]  If 7PM-7AM, please contact  night-coverage [email or web address] Password [facility] 12/27/2016, 9:58 AM

## 2016-12-27 NOTE — Consult Note (Signed)
Cardiology Consultation:   Patient ID: Heather Benson; [id number]; 1931-11-23   Admit date: 12/22/2016 Date of Consult: 12/27/2016  Primary Care Provider: [name], MD Primary Cardiologist: Dr Irish Lack   Patient Profile:   Heather Benson is Heather 83 y.o. female with Heather hx of PAF who is being seen today for the evaluation of AF with RVR at the request of Dr Doyle Askew.  History of Present Illness:   Heather Benson is an 83 y/o female followed by Dr Irish Lack with Heather history of PAF. She was placed on Amiodarone and cardioverted in March 2016 for recurrent PAF. Her beta blocker was stopped in April 2016 secondary to sinu bradycardia. She is on lower dose Eliquis. She has been maintaining NSR. Her LOV with Dr Irish Lack was March 2018. He notes the pt is not very active. She has CRI-4 but has indicated she does not want dialysis. She is Heather DNR.  The pt was admitted 12/22/16 with Heather lower GI bleed. Her admission Hgb was 9.0 and dropped to 7.1 and she was transfused. Her Eliquis was reversed. Since admission she has had runs of what looks like atrial flutter with 2:1 conduction. These have responded previously to IV beta blocker. She had recurrent tachycardia today with low B/P and we were consulted.   Past Medical History:  Diagnosis Date  . Aortic atherosclerosis ([place]) 12/25/2016  . Aortic stenosis, mild 12/14/2015  . Arthritis 08-28-11   right knee pain-surgery planned  . Cancer [facility]) 08-28-11   Melonoma-cheek(many Yrs ago) , recent bx. left  face lesion, past skin cancer lesions  . CAP (community acquired pneumonia) 12/13/2015  . CHF (congestive heart failure) ([place])   . Complication of anesthesia 08-28-11   in past arrythmia-  cardiology has seen in past  . Dysrhythmia 08-28-11   only immed.  to several days after surgery- hx. Heather.fib. in past  . GERD (gastroesophageal reflux disease) 08-28-11   tx. omeprazole  . Hypertension 08-28-11   tx. meds  . IBS (irritable bowel syndrome)   . Neuromuscular  disorder ([place]) 08-28-11   hx. Polio age 45-slight residual affects legs  . Pneumonia 12/2015   hospital x 3 days  . PONV (postoperative nausea and vomiting)   . Swelling of extremity 08-28-11   bilateral lower extremities- mid calf down    Past Surgical History:  Procedure Laterality Date  . ABDOMINAL HYSTERECTOMY  08-28-11   Partial-vaginal approach  . BREAST REDUCTION SURGERY  1997  . BUNIONECTOMY  08-28-11   right  . CARDIOVERSION N/Heather 08/05/2014   Procedure: CARDIOVERSION;  Surgeon: Larey Dresser, MD;  Location: [place];  Service: Cardiovascular;  Laterality: N/Heather;  . CATARACT EXTRACTION, BILATERAL  08-28-11   bilateral  . COLONOSCOPY WITH PROPOFOL N/Heather 12/24/2016   Procedure: COLONOSCOPY WITH PROPOFOL;  Surgeon: Wonda Horner, MD;  Location: [facility] ENDOSCOPY;  Service: Endoscopy;  Laterality: N/Heather;  . EYE SURGERY     Cataract  . JOINT REPLACEMENT  Oct. 1, 2012   Right Knee  . KNEE ARTHROSCOPY  09/06/2011   Procedure: ARTHROSCOPY KNEE;  Surgeon: Gearlean Alf, MD;  Location: [facility];  Service: Orthopedics;  Laterality: Right;  with Synovectomy      Prior to Admission medications   Medication Sig Start Date End Date Taking? Authorizing Provider  acetaminophen (TYLENOL) 500 MG tablet Take 1,000 mg by mouth every 8 (eight) hours as needed for moderate pain (pain in legs).   Yes [provider]  amiodarone (PACERONE) 200  MG tablet TAKE 1 TABLET (200 MG TOTAL) BY MOUTH DAILY. 10/31/15  Yes Jettie Booze, MD  amLODipine (NORVASC) 2.5 MG tablet Take 1 tablet (2.5 mg total) by mouth daily. 02/03/15  Yes Jettie Booze, MD  beclomethasone (QVAR) 80 MCG/ACT inhaler Inhale 2 puffs into the lungs 2 (two) times daily.   Yes [provider]  Cholecalciferol (VITAMIN D3) 2000 UNITS capsule Take 2,000 Units by mouth daily.   Yes [provider]  ELIQUIS 2.5 MG TABS tablet TAKE 1 TABLET BY MOUTH TWICE Heather DAY 11/26/16  Yes Jettie Booze, MD    fexofenadine (ALLEGRA) 180 MG tablet Take 180 mg by mouth daily.   Yes [provider]  fluticasone (FLONASE) 50 MCG/ACT nasal spray Place 1 spray into both nostrils daily as needed for allergies.  09/27/13  Yes [provider]  furosemide (LASIX) 40 MG tablet Take 20 mg by mouth daily. 12/09/15  Yes [provider]  guaiFENesin (MUCINEX) 600 MG 12 hr tablet Take 600 mg by mouth 2 (two) times daily.   Yes [provider]  latanoprost (XALATAN) 0.005 % ophthalmic solution Place 1 drop into both eyes at bedtime. 08/11/14  Yes [provider]  meclizine (ANTIVERT) 25 MG tablet Take 12.5 mg by mouth 3 (three) times daily as needed for dizziness.  04/01/14  Yes [provider]  pantoprazole (PROTONIX) 40 MG tablet Take 40 mg by mouth daily. 11/01/15  Yes [provider]  Respiratory Therapy Supplies (FLUTTER) DEVI Use as directed. 02/06/16   Marshell Garfinkel, MD     Inpatient Medications: Scheduled Meds: . amiodarone  200 mg Oral Daily  . budesonide  0.5 mg Inhalation BID  . latanoprost  1 drop Both Eyes QHS  . pantoprazole  40 mg Oral Q1200   Continuous Infusions: . levofloxacin (LEVAQUIN) IV Stopped (12/27/16 1253)   PRN Meds: acetaminophen, fluticasone, guaiFENesin, meclizine, ondansetron **OR** ondansetron (ZOFRAN) IV, traMADol  Allergies:    Allergies  Allergen Reactions  . Augmentin [Amoxicillin-Pot Clavulanate] Diarrhea and Nausea Only  . Boniva [Ibandronic Acid] Diarrhea  . Hydromorphone Nausea And Vomiting  . Latex Other (See Comments)    Unknown  . Lisinopril Cough  . Codeine Other (See Comments)    "Spaced out feeling"  . Tape Itching and Rash    Social History:   Social History   Social History  . Marital status: Married    Spouse name: [name]  . Number of children: 3  . Years of education: 12   Occupational History  . Retired- [employer] History Main Topics  . Smoking status:  Never Smoker  . Smokeless tobacco: Never Used  . Alcohol use No  . Drug use: No  . Sexual activity: Not Currently    Partners: Male   Other Topics Concern  . Not on file   Social History Narrative   Lives with husband, Hilbert Odor at home   Caffeine use: Drinks coffee/tea/caffeine free soda   OCCUPATION: retired     Family History:     Family History  Problem Relation Age of Onset  . Heart disease Mother        Varicose Veins  . Hyperlipidemia Mother   . Hypertension Mother   . Colon cancer Mother   . Heart disease Father        Heart Disease before age 35  . Hypertension Father   . Emphysema Father   . Breast cancer Sister   .  Stroke Maternal Grandmother   . Heart attack Brother      ROS:  Please see the history of present illness.  ROS  All other ROS reviewed and negative.     Physical Exam/Data:   Vitals:   12/27/16 1100 12/27/16 1144 12/27/16 1221 12/27/16 1328  BP: (!) 80/60 102/61 (!) 111/53 (!) 112/53  Pulse: (!) 157 (!) 158 67 67  Resp: 18 16 16 16   Temp:    98.3 F (36.8 C)  TempSrc:    Oral  SpO2: 97%  95% 96%  Weight:      Height:        Intake/Output Summary (Last 24 hours) at 12/27/16 1425 Last data filed at 12/27/16 0913  Gross per 24 hour  Intake              400 ml  Output                1 ml  Net              399 ml   Filed Weights   12/22/16 0833 12/24/16 0749  Weight: 186 lb 8 oz (84.6 kg) 186 lb (84.4 kg)   Body mass index is 30.95 kg/m.  General appearance: alert and no distress Neck: no carotid bruit, no JVD and thyroid not enlarged, symmetric, no tenderness/mass/nodules Lungs: clear to auscultation bilaterally Heart: regular rate and rhythm, S1, S2 normal, no murmur, click, rub or gallop Abdomen: soft, non-tender; bowel sounds normal; no masses,  no organomegaly Extremities: edema 1+ bilateral pitting edema Pulses: 2+ and symmetric Skin: Skin color, texture, turgor normal. No rashes or lesions Neurologic: Grossly  normal Psych: Pleasant   EKG:  The EKG was personally reviewed and demonstrates:  SVT at 161. Telemetry:  Telemetry was personally reviewed and demonstrates: Normal sinus rhythm  Relevant CV Studies: Echo- Aug 2017-  Study Conclusions  - Left ventricle: The cavity size was normal. There was moderate   concentric hypertrophy. Systolic function was normal. The   estimated ejection fraction was in the range of 55% to 60%. Left   ventricular diastolic function parameters were normal. - Aortic valve: There was very mild stenosis. There was trivial   regurgitation. Valve area (VTI): 1.23 cm^2. Valve area (Vmax):   1.51 cm^2. Valve area (Vmean): 1.27 cm^2. - Mitral valve: Calcified annulus. There was mild regurgitation. - Atrial septum: No defect or patent foramen ovale was identified.   Laboratory Data:  Chemistry Recent Labs Lab 12/25/16 0242 12/26/16 0524 12/27/16 0645  NA 141 142 139  K 4.3 4.1 3.8  CL 110 113* 111  CO2 26 23 24   GLUCOSE 96 103* 111*  BUN 19 17 14   CREATININE 2.15* 2.26* 2.02*  CALCIUM 8.2* 7.9* 7.9*  GFRNONAA 20* 19* 22*  GFRAA 23* 22* 25*  ANIONGAP 5 6 4*     Recent Labs Lab 12/22/16 0230  PROT 6.4*  ALBUMIN 3.1*  AST 23  ALT 20  ALKPHOS 47  BILITOT 0.7   Hematology Recent Labs Lab 12/25/16 2131 12/26/16 0524 12/27/16 0645  WBC 13.1* 10.9* 12.5*  RBC 2.81* 2.85* 2.91*  HGB 8.0* 8.0* 8.2*  HCT 25.9* 26.1* 26.6*  MCV 92.2 91.6 91.4  MCH 28.5 28.1 28.2  MCHC 30.9 30.7 30.8  RDW 19.0* 19.0* 19.0*  PLT 168 172 183   Cardiac Enzymes Recent Labs Lab 12/24/16 0204  TROPONINI 0.06*   No results for input(s): TROPIPOC in the last 168 hours.  BNPNo results for  input(s): BNP, PROBNP in the last 168 hours.  DDimer No results for input(s): DDIMER in the last 168 hours.  Radiology/Studies:  Ct Abdomen Pelvis Wo Contrast  Result Date: 12/24/2016 CLINICAL DATA:  Bright red blood per rectum. Abdominal pain. GI bleed. Known  diverticulosis. Polypoid mass the base the cecum. Possible appendicitis. EXAM: CT ABDOMEN AND PELVIS WITHOUT CONTRAST TECHNIQUE: Multidetector CT imaging of the abdomen and pelvis was performed following the standard protocol without IV contrast. COMPARISON:  None. FINDINGS: Lower chest: Bandlike densities in the lower lobes and posteriorly in the left upper lobe favoring atelectasis. Dense mitral valve calcification. Coronary artery atherosclerotic calcification. Trace bilateral pleural effusions. Small type 1 hiatal hernia. Hepatobiliary: Slight nodularity of the liver margin is specially anteriorly along the left hepatic lobe, cirrhosis not excluded. Pancreas: Unremarkable Spleen: Unremarkable Adrenals/Urinary Tract: Bilateral exophytic renal lesions including Heather larger 5.6 by 4.2 cm lesion of the right eighth kidney, and questionably accentuated density in bilateral upper pole small exophytic lesions which could be complex cysts but which are technically nonspecific. No urinary tract calculi are identified. Fullness of both collecting systems without overt hydronephrosis. Stomach/Bowel: There is some wall thickening along the ostium of the appendix for example on images 59-61 series 3 with the appendix itself measuring up to 7 mm in diameter, borderline prominent. No periappendiceal stranding to further suggest acute appendicitis. Considerable sigmoid colon diverticulosis with indistinctness of tissue planes between the sigmoid colon and the left adnexa. This may simply be incidental. Vascular/Lymphatic: Aortoiliac atherosclerotic vascular disease. No pathologic adenopathy identified. Reproductive: Uterus absent.  Ovaries poorly seen. Other: No supplemental non-categorized findings. Musculoskeletal: Small umbilical hernia contains adipose tissue. Direct left inguinal hernia contains adipose tissue and Heather small amount of fluid. T12 compression fracture eccentric to the right, likely chronic. IMPRESSION: 1. There is  some local polypoid thickening in the cecum at the ostium of the appendix, but the appendix itself is only upper normal in diameter, at 7 mm, and there is no periappendiceal stranding. I cannot exclude Heather small polyp in the cecum near the appendiceal orifice. 2. Mild bibasilar atelectasis.  Trace bilateral pleural effusions. 3. Aortic Atherosclerosis (ICD10-I70.0). Dense mitral valve calcification and coronary artery atherosclerosis. 4. Considerable sigmoid colon diverticulosis. 5. Direct left inguinal hernia contains adipose tissue and Heather small amount of fluid. 6. T12 compression fracture eccentric to the right, likely chronic. 7. Bilateral renal lesions are likely cysts although several may be complex. These are not fully characterized on today' s noncontrast exam. 8. Slight nodularity of the anterior portion of the left hepatic lobe, cirrhosis not excluded. 9. Small type 1 hiatal hernia. Electronically Signed   By: Van Clines M.D.   On: 12/24/2016 17:32    Assessment and Plan:   Atrial fibrillation/ atrial flutter with 2:1 conduction- Pt has Heather history of PAF. Amiodarone added in March 2016. Bet blocker stopped in there past secondary to slow rates. Admitted now with GI bleed and has had periods of what looks like atrial flutter with 2:1 conduction which has responded previously this admission to IV beta blcoker  Acute GI bleeding- She was on Eliquis 2.5 mg BID prior to admission. This was reversed and she has been transfused-Hgb 8.2  CRI- 4 Dr Lorrene Reid has followed her as an OP. Dr Irish Lack reported her SCr to be 1.9 in March 2018   Plan: Her B/P has reportedly been low. Could try Amiodarone bolus vs DCCV though she reportedly has been in and out of AF this adm. Will  discuss with MD.  Signed, Kerin Ransom, PA-C  12/27/2016 2:25 PM   Pt. Seen and examined. Agree with the Resident/NP/PA-C note as written. Asked to see patient with intermittent tachycardia - appears by EKG today to be an SVT  with rates in the 160's - currently she is in sinus rhythm in the 60's. Has responded to b-blocker, which was discontinued previously due to bradycardia. No role for cardioversion as she is back in sinus. Would start low dose metoprolol tartrate and monitor for bradycardia - specifically, if rates below 50 sustained, she may not tolerate it. With regards to anticoagulation, her CHADSVASC score is 5 (female, 92, HTN, CHF). No clear culprit for lower GI bleeding, therefore would recommend restarting Eliquis at discharge. Given age >59 and Cr >1.5, the 2.5 mg BID dose is appropriate. I would not use aspirin.  Follow-up with Dr. Irish Lack after discharge. Thanks for the consultation. Call with questions.  Time Spent with Patient: I have spent Heather total of 35 minutes with patient reviewing hospital notes, telemetry, EKGs, labs and examining the patient as well as establishing an assessment and plan that was discussed with the patient. > 50% of time was spent in direct patient care.  Pixie Casino, MD, [place]  Attending Cardiologist  Direct Dial: [phone]  Fax: [phone]  Website:  [email or web address]

## 2016-12-27 NOTE — Progress Notes (Signed)
Pharmacy Antibiotic Note  Heather Benson is a 83 y.o. female admitted on 12/22/2016 with UTI.  Pharmacy has been consulted for Levaquin dosing.  Not improving after several days of ciprofloxacin.  Plan: Start Levaquin 500mg  IV Q48h Monitor clinical picture, renal function F/U C&S, abx deescalation / LOT  Consider 7-10 day course total including days on cipro?   Height: 5\' 5"  (165.1 cm) Weight: 186 lb (84.4 kg) IBW/kg (Calculated) : 57  Temp (24hrs), Avg:99.2 F (37.3 C), Min:98.5 F (36.9 C), Max:99.7 F (37.6 C)   Recent Labs Lab 12/23/16 0754  12/24/16 0204  12/25/16 0242  12/25/16 1452 12/25/16 1843 12/25/16 2131 12/26/16 0033 12/26/16 0524 12/27/16 0645  WBC 23.6*  < > 17.1*  < >  --   < > 13.4* 13.0* 13.1*  --  10.9* 12.5*  CREATININE 2.73*  --  2.31*  --  2.15*  --   --   --   --   --  2.26* 2.02*  LATICACIDVEN  --   --   --   --   --   --   --   --  1.3 1.0  --   --   < > = values in this interval not displayed.  Estimated Creatinine Clearance: 22.7 mL/min (A) (by C-G formula based on SCr of 2.02 mg/dL (H)).    Allergies  Allergen Reactions  . Augmentin [Amoxicillin-Pot Clavulanate] Diarrhea and Nausea Only  . Boniva [Ibandronic Acid] Diarrhea  . Hydromorphone Nausea And Vomiting  . Latex Other (See Comments)    Unknown  . Lisinopril Cough  . Codeine Other (See Comments)    "Spaced out feeling"  . Tape Itching and Rash    Antimicrobials this admission: Ciprofloxacin 8/13 >> 8/17 Levaquin 8/17 >>   Dose adjustments this admission: n/a  Microbiology results: 8/13 UCx: E. Coli and salmonella    Thank you for allowing pharmacy to be a part of this patient's care.  Reginia Naas 12/27/2016 10:16 AM

## 2016-12-28 LAB — CBC
HEMATOCRIT: 24.7 % — AB (ref 36.0–46.0)
Hemoglobin: 7.5 g/dL — ABNORMAL LOW (ref 12.0–15.0)
MCH: 28 pg (ref 26.0–34.0)
MCHC: 30.4 g/dL (ref 30.0–36.0)
MCV: 92.2 fL (ref 78.0–100.0)
Platelets: 184 10*3/uL (ref 150–400)
RBC: 2.68 MIL/uL — ABNORMAL LOW (ref 3.87–5.11)
RDW: 19.3 % — AB (ref 11.5–15.5)
WBC: 9.6 10*3/uL (ref 4.0–10.5)

## 2016-12-28 LAB — BASIC METABOLIC PANEL
ANION GAP: 6 (ref 5–15)
BUN: 13 mg/dL (ref 6–20)
CALCIUM: 7.9 mg/dL — AB (ref 8.9–10.3)
CO2: 23 mmol/L (ref 22–32)
CREATININE: 1.93 mg/dL — AB (ref 0.44–1.00)
Chloride: 112 mmol/L — ABNORMAL HIGH (ref 101–111)
GFR, EST AFRICAN AMERICAN: 27 mL/min — AB (ref >60)
GFR, EST NON AFRICAN AMERICAN: 23 mL/min — AB (ref >60)
Glucose, Bld: 122 mg/dL — ABNORMAL HIGH (ref 65–99)
Potassium: 3.9 mmol/L (ref 3.5–5.1)
SODIUM: 141 mmol/L (ref 135–145)

## 2016-12-28 MED ORDER — METOPROLOL TARTRATE 25 MG PO TABS: 12.5000 mg | ORAL_TABLET | Freq: Two times a day (BID) | ORAL | 0 refills | Status: DC

## 2016-12-28 MED ORDER — TRAMADOL HCL 50 MG PO TABS: 50.0000 mg | ORAL_TABLET | Freq: Two times a day (BID) | ORAL | 0 refills | Status: DC | PRN

## 2016-12-28 NOTE — Discharge Instructions (Signed)
Atrial Fibrillation Atrial fibrillation is a type of irregular or rapid heartbeat (arrhythmia). In atrial fibrillation, the heart quivers continuously in a chaotic pattern. This occurs when parts of the heart receive disorganized signals that make the heart unable to pump blood normally. This can increase the risk for stroke, heart failure, and other heart-related conditions. There are different types of atrial fibrillation, including:  Paroxysmal atrial fibrillation. This type starts suddenly, and it usually stops on its own shortly after it starts.  Persistent atrial fibrillation. This type often lasts longer than a week. It may stop on its own or with treatment.  Long-lasting persistent atrial fibrillation. This type lasts longer than 12 months.  Permanent atrial fibrillation. This type does not go away.  Talk with your health care provider to learn about the type of atrial fibrillation that you have. What are the causes? This condition is caused by some heart-related conditions or procedures, including:  A heart attack.  Coronary artery disease.  Heart failure.  Heart valve conditions.  High blood pressure.  Inflammation of the sac that surrounds the heart (pericarditis).  Heart surgery.  Certain heart rhythm disorders, such as Wolf-Parkinson-White syndrome.  Other causes include:  Pneumonia.  Obstructive sleep apnea.  Blockage of an artery in the lungs (pulmonary embolism, or PE).  Lung cancer.  Chronic lung disease.  Thyroid problems, especially if the thyroid is overactive (hyperthyroidism).  Caffeine.  Excessive alcohol use or illegal drug use.  Use of some medicines, including certain decongestants and diet pills.  Sometimes, the cause cannot be found. What increases the risk? This condition is more likely to develop in:  People who are older in age.  People who smoke.  People who have diabetes mellitus.  People who are overweight  (obese).  Athletes who exercise vigorously.  What are the signs or symptoms? Symptoms of this condition include:  A feeling that your heart is beating rapidly or irregularly.  A feeling of discomfort or pain in your chest.  Shortness of breath.  Sudden light-headedness or weakness.  Getting tired easily during exercise.  In some cases, there are no symptoms. How is this diagnosed? Your health care provider may be able to detect atrial fibrillation when taking your pulse. If detected, this condition may be diagnosed with:  An electrocardiogram (ECG).  A Holter monitor test that records your heartbeat patterns over a 24-hour period.  Transthoracic echocardiogram (TTE) to evaluate how blood flows through your heart.  Transesophageal echocardiogram (TEE) to view more detailed images of your heart.  A stress test.  Imaging tests, such as a CT scan or chest X-ray.  Blood tests.  How is this treated? The main goals of treatment are to prevent blood clots from forming and to keep your heart beating at a normal rate and rhythm. The type of treatment that you receive depends on many factors, such as your underlying medical conditions and how you feel when you are experiencing atrial fibrillation. This condition may be treated with:  Medicine to slow down the heart rate, bring the heart's rhythm back to normal, or prevent clots from forming.  Electrical cardioversion. This is a procedure that resets your heart's rhythm by delivering a controlled, low-energy shock to the heart through your skin.  Different types of ablation, such as catheter ablation, catheter ablation with pacemaker, or surgical ablation. These procedures destroy the heart tissues that send abnormal signals. When the pacemaker is used, it is placed under your skin to help your heart beat in   a regular rhythm.  Follow these instructions at home:  Take over-the counter and prescription medicines only as told by your  health care provider.  If your health care provider prescribed a blood-thinning medicine (anticoagulant), take it exactly as told. Taking too much blood-thinning medicine can cause bleeding. If you do not take enough blood-thinning medicine, you will not have the protection that you need against stroke and other problems.  Do not use tobacco products, including cigarettes, chewing tobacco, and e-cigarettes. If you need help quitting, ask your health care provider.  If you have obstructive sleep apnea, manage your condition as told by your health care provider.  Do not drink alcohol.  Do not drink beverages that contain caffeine, such as coffee, soda, and tea.  Maintain a healthy weight. Do not use diet pills unless your health care provider approves. Diet pills may make heart problems worse.  Follow diet instructions as told by your health care provider.  Exercise regularly as told by your health care provider.  Keep all follow-up visits as told by your health care provider. This is important. How is this prevented?  Avoid drinking beverages that contain caffeine or alcohol.  Avoid certain medicines, especially medicines that are used for breathing problems.  Avoid certain herbs and herbal medicines, such as those that contain ephedra or ginseng.  Do not use illegal drugs, such as cocaine and amphetamines.  Do not smoke.  Manage your high blood pressure. Contact a health care provider if:  You notice a change in the rate, rhythm, or strength of your heartbeat.  You are taking an anticoagulant and you notice increased bruising.  You tire more easily when you exercise or exert yourself. Get help right away if:  You have chest pain, abdominal pain, sweating, or weakness.  You feel nauseous.  You notice blood in your vomit, bowel movement, or urine.  You have shortness of breath.  You suddenly have swollen feet and ankles.  You feel dizzy.  You have sudden weakness or  numbness of the face, arm, or leg, especially on one side of the body.  You have trouble speaking, trouble understanding, or both (aphasia).  Your face or your eyelid droops on one side. These symptoms may represent a serious problem that is an emergency. Do not wait to see if the symptoms will go away. Get medical help right away. Call your local emergency services (911 in the [place] Do not drive yourself to the hospital. This information is not intended to replace advice given to you by your health care provider. Make sure you discuss any questions you have with your health care provider. Document Released: 04/29/2005 Document Revised: 09/06/2015 Document Reviewed: 08/24/2014 Elsevier Interactive Patient Education  2017 [employer]  

## 2016-12-28 NOTE — Progress Notes (Signed)
Patient ID: Heather Benson, female   DOB: 1931/08/21, 83 y.o.   MRN: [id number]    PROGRESS NOTE  Heather Benson  XIP:[id number] DOB: 06-Jul-1931 DOA: 12/22/2016  PCP: [name], MD   Brief Narrative:  83 year old woman PMH PAF on apixabanpresented with brisk rectal bleeding with clots.admitted for GI bleed. Transfused in ED and treated with K centra to reverse anticoagulation. Gastroenterology recommended nuclear medicine tagged red blood cell scan which was negative.subsequently underwent colonoscopy which revealed diverticulosis of the sigmoid colon and descending colon. It is suspected bleeding was diverticular in nature. Also seen was a polypoid mass at the base of the cecum that after biopsy was noted to have some purulent material.  Assessment/Plan Acute lower GI bleed secondary to diverticulosis, acute blood loss anemia  - no further bleeding but Hg is down overnight - we have not started back on Eliquis but I am worried about starting Eliquis yet and discharge pt - pt has been transfused 1 U PRBC initially, will follow up on CBC in AM and if Hg stable, can d/c SNF  Leukocytosis.secondary to UTI, salmonella and E. Coli species  - pt has been on Cipro but now WBC is up this AM - sensitivity report indicates susceptibility of the two species to Levaquin - Cipro has been changed to Levaquin and today is day #2 of Levaquin - WBC is now back to normal limits   GNR UTI with hematuria, salmonella and E. Coli species  - keep on Levaquin as noted above   Polypoid thickening of the cecum at the ostium of the appendix.  - No periappendiceal stranding, upper limits of normal in appearance.  PAF on apixaban and amiodarone as an outpatient.  - may start eliquis if Hg stable in AM  CKD stage IV - Cr overall stable   Aortic atherosclerosis - asymptomatic   SVT - back in NRS - started low dose Metoprolol 12.5 mg BID   Obesity  - Body mass index is 30.95 kg/m.  DVT  prophylaxis: SCD's Code Status: DNR Family Communication: husband at bedside  Disposition Plan: SNF in AM if Hg stable   Consultants:   GI  Procedures:   Colonoscopy 8/14 - One 10 mm polyp at the recto-sigmoid colon, removed with a hot snare. Resected and retrieved. Diverticulosis in the sigmoid colon and in the descending colon. Suspect that the bleeding was diverticular. No bleeding now. Polypoid mass at base of cecum. On one biopsysome purulent material was noted. ? if this could be a large everted bulging appendix.  Antimicrobials:   Cipro 8/13 --> 8/17  Levaquin 8/17 -->  Subjective: Reports feeling better this AM.  Objective: Vitals:   12/27/16 2037 12/27/16 2100 12/28/16 0624 12/28/16 0904  BP: (!) 115/46  122/62   Pulse: 64  79   Resp: 16  16   Temp: 98.3 F (36.8 C)  98.8 F (37.1 C)   TempSrc: Oral  Oral   SpO2: 98% 100% 93% 95%  Weight:      Height:        Intake/Output Summary (Last 24 hours) at 12/28/16 1119 Last data filed at 12/28/16 0624  Gross per 24 hour  Intake              702 ml  Output                0 ml  Net              702 ml  Filed Weights   12/22/16 0833 12/24/16 0749  Weight: 84.6 kg (186 lb 8 oz) 84.4 kg (186 lb)    Physical Exam  Constitutional: Appears well-developed and well-nourished. No distress.  CVS: RRR, S1/S2 +, no gallops, no carotid bruit.  Pulmonary: Effort and breath sounds normal, no stridor, rhonchi, wheezes, rales.  Abdominal: Soft. BS +,  no distension, tenderness, rebound or guarding.  Musculoskeletal: Normal range of motion. No edema and no tenderness.  Psychiatric: Normal mood and affect. Behavior, judgment, thought content normal.   Data Reviewed: I have personally reviewed following labs and imaging studies  CBC:  Recent Labs Lab 12/22/16 0230  12/23/16 0754  12/24/16 1224 12/24/16 1852  12/25/16 1843 12/25/16 2131 12/26/16 0524 12/27/16 0645 12/28/16 0656  WBC 18.3*  < > 23.6*  < > 12.4*  15.3*  < > 13.0* 13.1* 10.9* 12.5* 9.6  NEUTROABS 12.3*  --  18.9*  --  9.3* 11.1*  --   --  8.7*  --   --   --   HGB 13.7  < > 9.0*  < > 7.5* 9.0*  < > 7.9* 8.0* 8.0* 8.2* 7.5*  HCT 45.3  < > 29.2*  < > 23.7* 28.7*  < > 26.2* 25.9* 26.1* 26.6* 24.7*  MCV 89.2  < > 88.8  < > 88.8 90.5  < > 91.6 92.2 91.6 91.4 92.2  PLT 349  < > 244  < > 171 204  < > 188 168 172 183 184  < > = values in this interval not displayed. Basic Metabolic Panel:  Recent Labs Lab 12/22/16 1402 12/23/16 0754 12/24/16 0204 12/25/16 0242 12/26/16 0524 12/27/16 0645 12/28/16 0656  NA 143 142 142 141 142 139 141  K 4.8 5.1 4.0 4.3 4.1 3.8 3.9  CL 107 109 111 110 113* 111 112*  CO2 25 24 25 26 23 24 23   GLUCOSE 173* 140* 120* 96 103* 111* 122*  BUN 37* 42* 30* 19 17 14 13   CREATININE 2.39* 2.73* 2.31* 2.15* 2.26* 2.02* 1.93*  CALCIUM 8.3* 8.3* 7.9* 8.2* 7.9* 7.9* 7.9*  MG 2.3 2.1  --   --   --   --   --    Liver Function Tests:  Recent Labs Lab 12/22/16 0230  AST 23  ALT 20  ALKPHOS 47  BILITOT 0.7  PROT 6.4*  ALBUMIN 3.1*   Coagulation Profile:  Recent Labs Lab 12/22/16 0230  INR 1.11   Cardiac Enzymes:  Recent Labs Lab 12/24/16 0204  TROPONINI 0.06*   CBG:  Recent Labs Lab 12/25/16 0102  GLUCAP 92   Urine analysis:    Component Value Date/Time   COLORURINE YELLOW 12/23/2016 1546   APPEARANCEUR TURBID (A) 12/23/2016 1546   LABSPEC 1.025 12/23/2016 1546   PHURINE 5.5 12/23/2016 1546   GLUCOSEU 250 (A) 12/23/2016 1546   HGBUR LARGE (A) 12/23/2016 1546   BILIRUBINUR NEGATIVE 12/23/2016 1546   KETONESUR NEGATIVE 12/23/2016 1546   PROTEINUR 100 (A) 12/23/2016 1546   UROBILINOGEN 0.2 02/05/2011 0915   NITRITE POSITIVE (A) 12/23/2016 1546   LEUKOCYTESUR MODERATE (A) 12/23/2016 1546   Recent Results (from the past 240 hour(s))  MRSA PCR Screening     Status: None   Collection Time: 12/22/16 10:48 AM  Result Value Ref Range Status   MRSA by PCR NEGATIVE NEGATIVE Final     Comment:        The GeneXpert MRSA Assay (FDA approved for NASAL specimens only), is one component  of a comprehensive MRSA colonization surveillance program. It is not intended to diagnose MRSA infection nor to guide or monitor treatment for MRSA infections.   Culture, Urine     Status: Abnormal (Preliminary result)   Collection Time: 12/23/16  3:46 PM  Result Value Ref Range Status   Specimen Description URINE, CLEAN CATCH  Final   Special Requests NONE  Final   Culture (A)  Final    >=100,000 COLONIES/mL SALMONELLA SPECIES 50,000 COLONIES/mL ESCHERICHIA COLI Referred to [facility] in [place], [place] for Serotyping. HEALTH DEPARTMENT NOTIFIED    Report Status PENDING  Incomplete   Organism ID, Bacteria SALMONELLA SPECIES (A)  Final   Organism ID, Bacteria ESCHERICHIA COLI (A)  Final      Susceptibility   Escherichia coli - MIC*    AMPICILLIN 4 SENSITIVE Sensitive     CEFAZOLIN <=4 SENSITIVE Sensitive     CEFTRIAXONE <=1 SENSITIVE Sensitive     CIPROFLOXACIN <=0.25 SENSITIVE Sensitive     GENTAMICIN <=1 SENSITIVE Sensitive     IMIPENEM <=0.25 SENSITIVE Sensitive     NITROFURANTOIN <=16 SENSITIVE Sensitive     TRIMETH/SULFA <=20 SENSITIVE Sensitive     AMPICILLIN/SULBACTAM <=2 SENSITIVE Sensitive     PIP/TAZO <=4 SENSITIVE Sensitive     Extended ESBL NEGATIVE Sensitive     * 50,000 COLONIES/mL ESCHERICHIA COLI   Salmonella species - MIC*    AMPICILLIN <=2 SENSITIVE Sensitive     LEVOFLOXACIN <=0.12 SENSITIVE Sensitive     TRIMETH/SULFA <=20 SENSITIVE Sensitive     * >=100,000 COLONIES/mL SALMONELLA SPECIES    Radiology Studies: No results found.   Scheduled Meds: . amiodarone  200 mg Oral Daily  . budesonide  0.5 mg Inhalation BID  . latanoprost  1 drop Both Eyes QHS  . metoprolol tartrate  12.5 mg Oral BID  . pantoprazole  40 mg Oral Q1200   Continuous Infusions: . levofloxacin (LEVAQUIN) IV Stopped (12/27/16 1253)     LOS: 6  days   Time spent: 25 minutes   Faye Ramsay, MD [facility] Pager [phone]  If 7PM-7AM, please contact night-coverage [email or web address] Password [email or web address] 12/28/2016, 11:19 AM

## 2016-12-29 LAB — BASIC METABOLIC PANEL
Anion gap: 7 (ref 5–15)
BUN: 12 mg/dL (ref 6–20)
CALCIUM: 7.7 mg/dL — AB (ref 8.9–10.3)
CO2: 22 mmol/L (ref 22–32)
CREATININE: 1.85 mg/dL — AB (ref 0.44–1.00)
Chloride: 111 mmol/L (ref 101–111)
GFR calc Af Amer: 28 mL/min — ABNORMAL LOW (ref >60)
GFR, EST NON AFRICAN AMERICAN: 24 mL/min — AB (ref >60)
GLUCOSE: 99 mg/dL (ref 65–99)
POTASSIUM: 3.7 mmol/L (ref 3.5–5.1)
SODIUM: 140 mmol/L (ref 135–145)

## 2016-12-29 LAB — CBC
HEMATOCRIT: 24.6 % — AB (ref 36.0–46.0)
HEMOGLOBIN: 7.5 g/dL — AB (ref 12.0–15.0)
MCH: 27.8 pg (ref 26.0–34.0)
MCHC: 30.5 g/dL (ref 30.0–36.0)
MCV: 91.1 fL (ref 78.0–100.0)
PLATELETS: 192 10*3/uL (ref 150–400)
RBC: 2.7 MIL/uL — ABNORMAL LOW (ref 3.87–5.11)
RDW: 19 % — AB (ref 11.5–15.5)
WBC: 10 10*3/uL (ref 4.0–10.5)

## 2016-12-29 LAB — PREPARE RBC (CROSSMATCH)

## 2016-12-29 MED ORDER — LEVOFLOXACIN 500 MG PO TABS
500.0000 mg | ORAL_TABLET | ORAL | Status: DC
  Administered 2016-12-29 – 2016-12-30 (×2): 500 mg via ORAL
  Filled 2016-12-29 (×2): qty 1

## 2016-12-29 MED ORDER — WHITE PETROLATUM GEL
Status: AC
  Administered 2016-12-29: 15:00:00
  Filled 2016-12-29: qty 1

## 2016-12-29 MED ORDER — SODIUM CHLORIDE 0.9 % IV SOLN
Freq: Once | INTRAVENOUS | Status: AC
  Administered 2016-12-29: 11:00:00 via INTRAVENOUS

## 2016-12-29 NOTE — Clinical Social Work Note (Signed)
Clinical Social Work Assessment  Patient Details  Name: Heather Benson MRN: [id number] Date of Birth: 09/26/1931  Date of referral:  12/29/16               Reason for consult:  Facility Placement                Permission sought to share information with:  Family Supports, Customer service manager Permission granted to share information::  Yes, Verbal Permission Granted  Name::     Heather, Benson [phone]  [phone]   Agency::  SNF admissions  Relationship::     Contact Information:     Housing/Transportation Living arrangements for the past 2 months:  [place] of Information:  Patient, Spouse Patient Interpreter Needed:  None Criminal Activity/Legal Involvement Pertinent to Current Situation/Hospitalization:  No - Comment as needed Significant Relationships:  Spouse Lives with:  Spouse Do you feel safe going back to the place where you live?  No Need for family participation in patient care:  No (Coment)  Care giving concerns:  Patient and her husband feels she needs some short term rehab before she is able to return back home.   Social Worker assessment / plan:  Patient is an 83 year old female who is alert and oriented x4.  Patient is married and lives with her husband, patient states she has not been to rehab before.  CSW explained to patient and her husband what the role of CSW is and the process for going to SNF.  Patient and her husband were explained how insurance will pay for the stay and what to expect at SNF.  CSW expressed what happens once patient is finished at [facility].  Patient states that her brother has already spoken with [facility] and she said they should be able to accept her.  CSW informed her that if a bed at [facility] is not available day of discharge they will have to consider going to a different facility.  Patient and family expressed understanding, CSW was given permission to begin bed search [place], patient did not have any other questions.   Employment status:  Retired Forensic scientist:  [employer] PT Recommendations:  [place] / Referral to community resources:  [place]  Patient/Family's Response to care: Patient and family agreeable to going to SNF for short term rehab  Patient/Family's Understanding of and Emotional Response to Diagnosis, Current Treatment, and Prognosis:  Patient expressed that she is hopeful she will not have to be at [facility].  Patient expressed she wants to get started on her therapy so she can return back home.  Emotional Assessment Appearance:  Appears stated age Attitude/Demeanor/Rapport:    Affect (typically observed):  Stable, Calm Orientation:  Oriented to Self, Oriented to Place, Oriented to  Time, Oriented to Situation Alcohol / Substance use:  Not Applicable Psych involvement (Current and /or in the community):  No (Comment)  Discharge Needs  Concerns to be addressed:  Lack of Support Readmission within the last 30 days:  No Current discharge risk:  Lack of support system Barriers to Discharge:  Continued Medical Work up   Ross Ludwig, [place] 12/29/2016, 2:09 PM

## 2016-12-29 NOTE — Progress Notes (Signed)
Pharmacy Antibiotic Note  Heather Benson is a 83 y.o. female admitted on 12/22/2016 with UTI.  Pharmacy has been consulted for Levaquin dosing.  Day #7 of total abx for E. Coli and salmonella UTI. Switched from ciprofloxacin to levofloxacin recently. Had urinary sxs. Afeb, WBC down to wnl.  Plan: Change Levaquin 500mg  to PO Q48h Monitor clinical picture, renal function F/U LOT  Consider 7-10 day course total including days on cipro?   Height: 5\' 5"  (165.1 cm) Weight: 186 lb (84.4 kg) IBW/kg (Calculated) : 57  Temp (24hrs), Avg:98.8 F (37.1 C), Min:98.6 F (37 C), Max:98.9 F (37.2 C)   Recent Labs Lab 12/25/16 0242  12/25/16 2131 12/26/16 0033 12/26/16 0524 12/27/16 0645 12/28/16 0656 12/29/16 0544  WBC  --   < > 13.1*  --  10.9* 12.5* 9.6 10.0  CREATININE 2.15*  --   --   --  2.26* 2.02* 1.93* 1.85*  LATICACIDVEN  --   --  1.3 1.0  --   --   --   --   < > = values in this interval not displayed.  Estimated Creatinine Clearance: 24.7 mL/min (A) (by C-G formula based on SCr of 1.85 mg/dL (H)).    Allergies  Allergen Reactions  . Augmentin [Amoxicillin-Pot Clavulanate] Diarrhea and Nausea Only  . Boniva [Ibandronic Acid] Diarrhea  . Hydromorphone Nausea And Vomiting  . Latex Other (See Comments)    Unknown  . Lisinopril Cough  . Codeine Other (See Comments)    "Spaced out feeling"  . Tape Itching and Rash    Thank you for allowing pharmacy to be a part of this patient's care.  Reginia Naas 12/29/2016 7:29 AM

## 2016-12-29 NOTE — Clinical Social Work Placement (Signed)
   CLINICAL SOCIAL WORK PLACEMENT  NOTE  Date:  12/29/2016  Patient Details  Name: Heather Benson MRN: [id number] Date of Birth: 06-Feb-1932  Clinical Social Work is seeking post-discharge placement for this patient at the [place] level of care (*CSW will initial, date and re-position this form in  chart as items are completed):  Yes   Patient/family provided with [place] Work Department's list of facilities offering this level of care within the geographic area requested by the patient (or if unable, by the patient's family).  Yes   Patient/family informed of their freedom to choose among providers that offer the needed level of care, that participate in Medicare, Medicaid or managed care program needed by the patient, have an available bed and are willing to accept the patient.  Yes   Patient/family informed of [facility]'s ownership interest in [facility] and [facility], as well as of the fact that they are under no obligation to receive care at these facilities.  PASRR submitted to EDS on 12/29/16     PASRR number received on 12/29/16     Existing PASRR number confirmed on       FL2 transmitted to all facilities in geographic area requested by pt/family on 12/29/16     FL2 transmitted to all facilities within larger geographic area on 12/29/16     Patient informed that his/her managed care company has contracts with or will negotiate with certain facilities, including the following:            Patient/family informed of bed offers received.  Patient chooses bed at       Physician recommends and patient chooses bed at      Patient to be transferred to   on  .  Patient to be transferred to facility by       Patient family notified on   of transfer.  Name of family member notified:        PHYSICIAN Please sign DNR, Please sign FL2     Additional Comment:    _______________________________________________ Ross Ludwig, LCSWA 12/29/2016, 2:20 PM

## 2016-12-29 NOTE — Progress Notes (Signed)
Patient ID: Heather Benson, female   DOB: 01-25-1932, 83 y.o.   MRN: [id number]    PROGRESS NOTE  Heather Benson  [id number]:[id number] DOB: [birth year] DOA: 12/22/2016  PCP: [name], MD   Brief Narrative:  83 year old woman PMH PAF on apixabanpresented with brisk rectal bleeding with clots.admitted for GI bleed. Transfused in ED and treated with K centra to reverse anticoagulation. Gastroenterology recommended nuclear medicine tagged red blood cell scan which was negative.subsequently underwent colonoscopy which revealed diverticulosis of the sigmoid colon and descending colon. It is suspected bleeding was diverticular in nature. Also seen was a polypoid mass at the base of the cecum that after biopsy was noted to have some purulent material.  Assessment/Plan Acute lower GI bleed secondary to diverticulosis, acute blood loss anemia  - no further bleeding but Hg is down overnight - we have not started back on Eliquis but I am worried about starting Eliquis yet and discharge pt - pt has been transfused 1 U PRBC initially and Hg still 7.5  - will transfuse one U PRBC today and if Hg stable, can resume AC in AM and possible d/c to SNF  Leukocytosis.secondary to UTI, salmonella and E. Coli species  - pt has been on Cipro but now WBC is up this AM - sensitivity report indicates susceptibility of the two species to Levaquin - Cipro has been changed to Levaquin and today is day #3/5 of Levaquin - WBC is stable   GNR UTI with hematuria, salmonella and E. Coli species  - keep on Levaquin as noted above   Polypoid thickening of the cecum at the ostium of the appendix.  - No periappendiceal stranding, upper limits of normal in appearance.  PAF on apixaban and amiodarone as an outpatient.  -- start AC in AM if Hg stable   CKD stage IV - overall stable   Aortic atherosclerosis - asymptomatic   SVT - back in NRS - keep on Metoprolol 12.5 mg BID  Obesity  - Body mass index is  30.95 kg/m.  DVT prophylaxis: SCD's Code Status: DNR Family Communication: husband at bedside  Disposition Plan: SNF in AM if Hg stable   Consultants:   GI  Procedures:   Colonoscopy 8/14 - One 10 mm polyp at the recto-sigmoid colon, removed with a hot snare. Resected and retrieved. Diverticulosis in the sigmoid colon and in the descending colon. Suspect that the bleeding was diverticular. No bleeding now. Polypoid mass at base of cecum. On one biopsysome purulent material was noted. ? if this could be a large everted bulging appendix.  Antimicrobials:   Cipro 8/13 --> 8/17  Levaquin 8/17 -->  Subjective: Pt denies any concerns this AM.  Objective: Vitals:   12/28/16 1300 12/28/16 2113 12/29/16 0451 12/29/16 1053  BP: (!) 102/53 (!) 129/51 (!) 122/58 (!) 116/51  Pulse: 65 74 74 64  Resp: 19 20 20 18   Temp: 98.6 F (37 C) 98.9 F (37.2 C) 98.9 F (37.2 C) 98.9 F (37.2 C)  TempSrc: Oral Oral Oral Oral  SpO2: 97% 92% 92%   Weight:      Height:        Intake/Output Summary (Last 24 hours) at 12/29/16 1106 Last data filed at 12/29/16 1053  Gross per 24 hour  Intake              470 ml  Output                0 ml  Net  470 ml   Filed Weights   12/22/16 0833 12/24/16 0749  Weight: 84.6 kg (186 lb 8 oz) 84.4 kg (186 lb)    Physical Exam  Constitutional: Appears well-developed and well-nourished. No distress.  CVS: RRR, S1/S2 +, no murmurs, no gallops, no carotid bruit.  Pulmonary: Effort and breath sounds normal, no stridor, rhonchi, wheezes, rales.  Abdominal: Soft. BS +,  no distension, tenderness, rebound or guarding.   Data Reviewed: I have personally reviewed following labs and imaging studies  CBC:  Recent Labs Lab 12/23/16 0754  12/24/16 1224 12/24/16 1852  12/25/16 2131 12/26/16 0524 12/27/16 0645 12/28/16 0656 12/29/16 0544  WBC 23.6*  < > 12.4* 15.3*  < > 13.1* 10.9* 12.5* 9.6 10.0  NEUTROABS 18.9*  --  9.3* 11.1*  --  8.7*   --   --   --   --   HGB 9.0*  < > 7.5* 9.0*  < > 8.0* 8.0* 8.2* 7.5* 7.5*  HCT 29.2*  < > 23.7* 28.7*  < > 25.9* 26.1* 26.6* 24.7* 24.6*  MCV 88.8  < > 88.8 90.5  < > 92.2 91.6 91.4 92.2 91.1  PLT 244  < > 171 204  < > 168 172 183 184 192  < > = values in this interval not displayed. Basic Metabolic Panel:  Recent Labs Lab 12/22/16 1402 12/23/16 0754  12/25/16 0242 12/26/16 0524 12/27/16 0645 12/28/16 0656 12/29/16 0544  NA 143 142  < > 141 142 139 141 140  K 4.8 5.1  < > 4.3 4.1 3.8 3.9 3.7  CL 107 109  < > 110 113* 111 112* 111  CO2 25 24  < > 26 23 24 23 22   GLUCOSE 173* 140*  < > 96 103* 111* 122* 99  BUN 37* 42*  < > 19 17 14 13 12   CREATININE 2.39* 2.73*  < > 2.15* 2.26* 2.02* 1.93* 1.85*  CALCIUM 8.3* 8.3*  < > 8.2* 7.9* 7.9* 7.9* 7.7*  MG 2.3 2.1  --   --   --   --   --   --   < > = values in this interval not displayed.  Cardiac Enzymes:  Recent Labs Lab 12/24/16 0204  TROPONINI 0.06*   CBG:  Recent Labs Lab 12/25/16 0102  GLUCAP 92   Urine analysis:    Component Value Date/Time   COLORURINE YELLOW 12/23/2016 1546   APPEARANCEUR TURBID (A) 12/23/2016 1546   LABSPEC 1.025 12/23/2016 1546   PHURINE 5.5 12/23/2016 1546   GLUCOSEU 250 (A) 12/23/2016 1546   HGBUR LARGE (A) 12/23/2016 1546   BILIRUBINUR NEGATIVE 12/23/2016 1546   KETONESUR NEGATIVE 12/23/2016 1546   PROTEINUR 100 (A) 12/23/2016 1546   UROBILINOGEN 0.2 02/05/2011 0915   NITRITE POSITIVE (A) 12/23/2016 1546   LEUKOCYTESUR MODERATE (A) 12/23/2016 1546   Recent Results (from the past 240 hour(s))  MRSA PCR Screening     Status: None   Collection Time: 12/22/16 10:48 AM  Result Value Ref Range Status   MRSA by PCR NEGATIVE NEGATIVE Final    Comment:        The GeneXpert MRSA Assay (FDA approved for NASAL specimens only), is one component of a comprehensive MRSA colonization surveillance program. It is not intended to diagnose MRSA infection nor to guide or monitor treatment  for MRSA infections.   Culture, Urine     Status: Abnormal (Preliminary result)   Collection Time: 12/23/16  3:46 PM  Result Value Ref  Range Status   Specimen Description URINE, CLEAN CATCH  Final   Special Requests NONE  Final   Culture (A)  Final    >=100,000 COLONIES/mL SALMONELLA SPECIES 50,000 COLONIES/mL ESCHERICHIA COLI Referred to [facility] in [place], [place] for Serotyping. HEALTH DEPARTMENT NOTIFIED    Report Status PENDING  Incomplete   Organism ID, Bacteria SALMONELLA SPECIES (A)  Final   Organism ID, Bacteria ESCHERICHIA COLI (A)  Final      Susceptibility   Escherichia coli - MIC*    AMPICILLIN 4 SENSITIVE Sensitive     CEFAZOLIN <=4 SENSITIVE Sensitive     CEFTRIAXONE <=1 SENSITIVE Sensitive     CIPROFLOXACIN <=0.25 SENSITIVE Sensitive     GENTAMICIN <=1 SENSITIVE Sensitive     IMIPENEM <=0.25 SENSITIVE Sensitive     NITROFURANTOIN <=16 SENSITIVE Sensitive     TRIMETH/SULFA <=20 SENSITIVE Sensitive     AMPICILLIN/SULBACTAM <=2 SENSITIVE Sensitive     PIP/TAZO <=4 SENSITIVE Sensitive     Extended ESBL NEGATIVE Sensitive     * 50,000 COLONIES/mL ESCHERICHIA COLI   Salmonella species - MIC*    AMPICILLIN <=2 SENSITIVE Sensitive     LEVOFLOXACIN <=0.12 SENSITIVE Sensitive     TRIMETH/SULFA <=20 SENSITIVE Sensitive     * >=100,000 COLONIES/mL SALMONELLA SPECIES    Radiology Studies: No results found.   Scheduled Meds: . amiodarone  200 mg Oral Daily  . budesonide  0.5 mg Inhalation BID  . latanoprost  1 drop Both Eyes QHS  . levofloxacin  500 mg Oral Q48H  . metoprolol tartrate  12.5 mg Oral BID  . pantoprazole  40 mg Oral Q1200   Continuous Infusions: . sodium chloride       LOS: 7 days   Time spent: 25 minutes   Faye Ramsay, MD [facility] Pager [phone]  If 7PM-7AM, please contact night-coverage [email or web address] Password [email or web address] 12/29/2016, 11:06 AM

## 2016-12-30 DIAGNOSIS — N184 Chronic kidney disease, stage 4 (severe): Secondary | ICD-10-CM | POA: Diagnosis not present

## 2016-12-30 DIAGNOSIS — R2681 Unsteadiness on feet: Secondary | ICD-10-CM | POA: Diagnosis not present

## 2016-12-30 DIAGNOSIS — I480 Paroxysmal atrial fibrillation: Secondary | ICD-10-CM | POA: Diagnosis not present

## 2016-12-30 DIAGNOSIS — N390 Urinary tract infection, site not specified: Secondary | ICD-10-CM | POA: Diagnosis not present

## 2016-12-30 DIAGNOSIS — R278 Other lack of coordination: Secondary | ICD-10-CM | POA: Diagnosis not present

## 2016-12-30 DIAGNOSIS — R41841 Cognitive communication deficit: Secondary | ICD-10-CM | POA: Diagnosis not present

## 2016-12-30 DIAGNOSIS — M6281 Muscle weakness (generalized): Secondary | ICD-10-CM | POA: Diagnosis not present

## 2016-12-30 DIAGNOSIS — K5733 Diverticulitis of large intestine without perforation or abscess with bleeding: Secondary | ICD-10-CM | POA: Diagnosis not present

## 2016-12-30 DIAGNOSIS — K625 Hemorrhage of anus and rectum: Secondary | ICD-10-CM | POA: Diagnosis not present

## 2016-12-30 LAB — BASIC METABOLIC PANEL
ANION GAP: 5 (ref 5–15)
BUN: 13 mg/dL (ref 6–20)
CALCIUM: 7.7 mg/dL — AB (ref 8.9–10.3)
CO2: 24 mmol/L (ref 22–32)
Chloride: 112 mmol/L — ABNORMAL HIGH (ref 101–111)
Creatinine, Ser: 1.75 mg/dL — ABNORMAL HIGH (ref 0.44–1.00)
GFR, EST AFRICAN AMERICAN: 30 mL/min — AB (ref >60)
GFR, EST NON AFRICAN AMERICAN: 26 mL/min — AB (ref >60)
GLUCOSE: 98 mg/dL (ref 65–99)
Potassium: 3.5 mmol/L (ref 3.5–5.1)
Sodium: 141 mmol/L (ref 135–145)

## 2016-12-30 LAB — TYPE AND SCREEN
ABO/RH(D): A POS
Antibody Screen: NEGATIVE
Unit division: 0

## 2016-12-30 LAB — CBC
HCT: 27.6 % — ABNORMAL LOW (ref 36.0–46.0)
Hemoglobin: 8.3 g/dL — ABNORMAL LOW (ref 12.0–15.0)
MCH: 26.7 pg (ref 26.0–34.0)
MCHC: 30.1 g/dL (ref 30.0–36.0)
MCV: 88.7 fL (ref 78.0–100.0)
PLATELETS: 205 10*3/uL (ref 150–400)
RBC: 3.11 MIL/uL — ABNORMAL LOW (ref 3.87–5.11)
RDW: 19 % — AB (ref 11.5–15.5)
WBC: 10.7 10*3/uL — AB (ref 4.0–10.5)

## 2016-12-30 LAB — BPAM RBC
BLOOD PRODUCT EXPIRATION DATE: 201809142359
ISSUE DATE / TIME: 201808191042
Unit Type and Rh: 6200

## 2016-12-30 MED ORDER — LEVOFLOXACIN 500 MG PO TABS: 500.0000 mg | ORAL_TABLET | ORAL | 0 refills | Status: DC

## 2016-12-30 NOTE — Progress Notes (Signed)
Report called to [facility] at [facility].

## 2016-12-30 NOTE — Care Management Important Message (Signed)
Important Message  Patient Details  Name: Heather Benson MRN: [id number] Date of Birth: 20-May-1931   Medicare Important Message Given:  Yes    Orbie Pyo 12/30/2016, 12:34 PM

## 2016-12-30 NOTE — Discharge Summary (Addendum)
Physician Discharge Summary  Heather Benson OAC:[id number] DOB: December 09, 1931 DOA: 12/22/2016  PCP: [name], MD  Admit date: 12/22/2016 Discharge date: 12/30/2016  Recommendations for Outpatient Follow-up:  1. Pt will need to follow up with PCP in 1-2 weeks post discharge 2. Please obtain BMP to evaluate electrolytes and kidney function 3. Please also check CBC to evaluate Hg and Hct levels, WBC  4. Pt to complete 3 more doses of Levaquin for UTI, please note that Levaquin is given every 48 hours 5. Please note that patient was started on metoprolol 12.5 mg twice a day per cardiology team recommendations. In the past patient has had Ramos with bradycardia on beta blocker so please monitor heart rate to ensure it is stable on current dose of metoprolol 6. Please also note that cardiologist recommended resuming Eliquis on discharge, monitor for signs of bleeding 7. Please note that Norvasc has been discontinued due to lower blood pressure   Discharge Diagnoses:  Principal Problem:   Diverticulitis of colon with hemorrhage Active Problems:   PAF (paroxysmal atrial fibrillation) (HCC)   CKD (chronic kidney disease), stage IV (HCC)   Acute lower UTI   Aortic atherosclerosis (HCC)   Discharge Condition: Stable  Diet recommendation: Heart healthy diet discussed in details   History of present illness:  83 year old woman PMH PAF on apixabanpresented with brisk rectal bleeding with clots.admitted for GI bleed. Transfused in ED and treated with K centra to reverse anticoagulation. Gastroenterology recommended nuclear medicine tagged red blood cell scan which was negative.subsequently underwent colonoscopy which revealed diverticulosis of the sigmoid colon and descending colon. It is suspected bleeding was diverticular in nature. Also seen was a polypoid mass at the base of the cecum that after biopsy was noted to have some purulent material.  Assessment/Plan Acute lower GI bleed  secondary to diverticulosis, acute blood loss anemia  - no further bleeding but Hg is down overnight - we have not started back on Eliquis but I am worried about starting Eliquis yet and discharge pt - pt has been transfused 1 U PRBC initially and Hg still 7.5  - additional unit of blood transfused on 12/29/2016, hemoglobin posttransfusion appropriately up - repeat blood work in one [employer] ensure hemoglobin stable  Leukocytosis.secondary to UTI, salmonella and E. Coli species  - pt has been on Cipro but now WBC is up this AM - sensitivity report indicates susceptibility of the two species to Levaquin - Cipro has been changed to Levaquin and today is day #4/7 of Levaquin - White blood cell is still slightly up this morning, continue Levaquin for 3 more doses  GNR UTI with hematuria, salmonella and E. Coli species  - Levaquin as noted above  Polypoid thickening of the cecum at the ostium of the appendix.  - No periappendiceal stranding, upper limits of normal in appearance.  PAF on apixaban and amiodarone as an outpatient.  - resume anticoagulation on discharge  CKD stage IV, Acute kidney injury with low grade ATN related to transient hypotension  - At baseline Cr is 1.9 but was slightly above baseline initially  - hydration provided and Cr has trended down   Aortic atherosclerosis - asymptomatic   SVT - back in NRS - keep on Metoprolol 12.5 mg BID  Obesity  - Body mass index is 30.95 kg/m.  DVT prophylaxis: SCD's Code Status: DNR Family Communication: patient and husband at bedside Disposition Plan: SNF   Consultants:   GI  Procedures:   Colonoscopy 8/14 - One 10  mm polyp at the recto-sigmoid colon, removed with a hot snare. Resected and retrieved. Diverticulosis in the sigmoid colon and in the descending colon. Suspect that the bleeding was diverticular. No bleeding now. Polypoid mass at base of cecum. On one biopsysome purulent material was noted. ? if  this could be a large everted bulging appendix.  Antimicrobials:   Cipro 8/13 --> 8/17  Levaquin 8/17 -->  Procedures/Studies: Ct Abdomen Pelvis Wo Contrast  Result Date: 12/24/2016 CLINICAL DATA:  Bright red blood per rectum. Abdominal pain. GI bleed. Known diverticulosis. Polypoid mass the base the cecum. Possible appendicitis. EXAM: CT ABDOMEN AND PELVIS WITHOUT CONTRAST TECHNIQUE: Multidetector CT imaging of the abdomen and pelvis was performed following the standard protocol without IV contrast. COMPARISON:  None. FINDINGS: Lower chest: Bandlike densities in the lower lobes and posteriorly in the left upper lobe favoring atelectasis. Dense mitral valve calcification. Coronary artery atherosclerotic calcification. Trace bilateral pleural effusions. Small type 1 hiatal hernia. Hepatobiliary: Slight nodularity of the liver margin is specially anteriorly along the left hepatic lobe, cirrhosis not excluded. Pancreas: Unremarkable Spleen: Unremarkable Adrenals/Urinary Tract: Bilateral exophytic renal lesions including a larger 5.6 by 4.2 cm lesion of the right eighth kidney, and questionably accentuated density in bilateral upper pole small exophytic lesions which could be complex cysts but which are technically nonspecific. No urinary tract calculi are identified. Fullness of both collecting systems without overt hydronephrosis. Stomach/Bowel: There is some wall thickening along the ostium of the appendix for example on images 59-61 series 3 with the appendix itself measuring up to 7 mm in diameter, borderline prominent. No periappendiceal stranding to further suggest acute appendicitis. Considerable sigmoid colon diverticulosis with indistinctness of tissue planes between the sigmoid colon and the left adnexa. This may simply be incidental. Vascular/Lymphatic: Aortoiliac atherosclerotic vascular disease. No pathologic adenopathy identified. Reproductive: Uterus absent.  Ovaries poorly seen. Other: No  supplemental non-categorized findings. Musculoskeletal: Small umbilical hernia contains adipose tissue. Direct left inguinal hernia contains adipose tissue and a small amount of fluid. T12 compression fracture eccentric to the right, likely chronic. IMPRESSION: 1. There is some local polypoid thickening in the cecum at the ostium of the appendix, but the appendix itself is only upper normal in diameter, at 7 mm, and there is no periappendiceal stranding. I cannot exclude a small polyp in the cecum near the appendiceal orifice. 2. Mild bibasilar atelectasis.  Trace bilateral pleural effusions. 3. Aortic Atherosclerosis (ICD10-I70.0). Dense mitral valve calcification and coronary artery atherosclerosis. 4. Considerable sigmoid colon diverticulosis. 5. Direct left inguinal hernia contains adipose tissue and a small amount of fluid. 6. T12 compression fracture eccentric to the right, likely chronic. 7. Bilateral renal lesions are likely cysts although several may be complex. These are not fully characterized on today' s noncontrast exam. 8. Slight nodularity of the anterior portion of the left hepatic lobe, cirrhosis not excluded. 9. Small type 1 hiatal hernia. Electronically Signed   By: Van Clines M.D.   On: 12/24/2016 17:32   Nm Gi Blood Loss  Result Date: 12/22/2016 CLINICAL DATA:  GI bleeding EXAM: NUCLEAR MEDICINE GASTROINTESTINAL BLEEDING SCAN TECHNIQUE: Sequential abdominal images were obtained for 2 hours following intravenous administration of Tc-83m labeled red blood cells. RADIOPHARMACEUTICALS:  23.9 mCi Tc-53m in-vitro labeled autologous red cells. COMPARISON:  None FINDINGS: Normal blood pool distribution of labeled red cells. No definite abnormal gastrointestinal localization of labeled red cells identified to suggest active GI bleeding. IMPRESSION: Negative GI bleeding scan. Electronically Signed   By: Crist Infante.D.  On: 12/22/2016 14:05     Discharge Exam: Vitals:   12/29/16 2205  12/30/16 0501  BP: (!) 118/51 116/62  Pulse: 68 66  Resp:    Temp: 98.8 F (37.1 C) 98.7 F (37.1 C)  SpO2: 91% 93%   Vitals:   12/29/16 1313 12/29/16 1530 12/29/16 2205 12/30/16 0501  BP: (!) 122/57 (!) 116/57 (!) 118/51 116/62  Pulse: 62 66 68 66  Resp: 18 (!) 22    Temp: 98.7 F (37.1 C) 97.9 F (36.6 C) 98.8 F (37.1 C) 98.7 F (37.1 C)  TempSrc: Oral Oral Oral Oral  SpO2:  96% 91% 93%  Weight:      Height:        General: Pt is alert, follows commands appropriately, not in acute distress Cardiovascular: Regular rate and rhythm, S1/S2 +, no rubs, no gallops Respiratory: Clear to auscultation bilaterally, no wheezing, no crackles, no rhonchi Abdominal: Soft, non tender, non distended, bowel sounds +, no guarding Extremities: no cyanosis, pulses palpable bilaterally DP and PT  Discharge Instructions  Discharge Instructions    Diet - low sodium heart healthy    Complete by:  As directed    Increase activity slowly    Complete by:  As directed      Allergies as of 12/30/2016      Reactions   Augmentin [amoxicillin-pot Clavulanate] Diarrhea, Nausea Only   Boniva [ibandronic Acid] Diarrhea   Hydromorphone Nausea And Vomiting   Latex Other (See Comments)   Unknown   Lisinopril Cough   Codeine Other (See Comments)   "Spaced out feeling"   Tape Itching, Rash      Medication List    STOP taking these medications   amLODipine 2.5 MG tablet Commonly known as:  NORVASC     TAKE these medications   acetaminophen 500 MG tablet Commonly known as:  TYLENOL Take 1,000 mg by mouth every 8 (eight) hours as needed for moderate pain (pain in legs).   amiodarone 200 MG tablet Commonly known as:  PACERONE TAKE 1 TABLET (200 MG TOTAL) BY MOUTH DAILY.   beclomethasone 80 MCG/ACT inhaler Commonly known as:  QVAR Inhale 2 puffs into the lungs 2 (two) times daily.   ELIQUIS 2.5 MG Tabs tablet Generic drug:  apixaban TAKE 1 TABLET BY MOUTH TWICE A DAY   fexofenadine  180 MG tablet Commonly known as:  ALLEGRA Take 180 mg by mouth daily.   fluticasone 50 MCG/ACT nasal spray Commonly known as:  FLONASE Place 1 spray into both nostrils daily as needed for allergies.   FLUTTER Devi Use as directed.   furosemide 40 MG tablet Commonly known as:  LASIX Take 20 mg by mouth daily.   guaiFENesin 600 MG 12 hr tablet Commonly known as:  MUCINEX Take 600 mg by mouth 2 (two) times daily.   latanoprost 0.005 % ophthalmic solution Commonly known as:  XALATAN Place 1 drop into both eyes at bedtime.   levofloxacin 500 MG tablet Commonly known as:  LEVAQUIN Take 1 tablet (500 mg total) by mouth every other day.   meclizine 25 MG tablet Commonly known as:  ANTIVERT Take 12.5 mg by mouth 3 (three) times daily as needed for dizziness.   metoprolol tartrate 25 MG tablet Commonly known as:  LOPRESSOR Take 0.5 tablets (12.5 mg total) by mouth 2 (two) times daily.   pantoprazole 40 MG tablet Commonly known as:  PROTONIX Take 40 mg by mouth daily.   traMADol 50 MG tablet Commonly known  as:  ULTRAM Take 1 tablet (50 mg total) by mouth every 12 (twelve) hours as needed for moderate pain.   Vitamin D3 2000 units capsule Take 2,000 Units by mouth daily.      Follow-up Information    [name], MD Follow up.   Specialty:  Family Medicine Contact information: [address] [place] [address] [phone]            The results of significant diagnostics from this hospitalization (including imaging, microbiology, ancillary and laboratory) are listed below for reference.     Microbiology: Recent Results (from the past 240 hour(s))  MRSA PCR Screening     Status: None   Collection Time: 12/22/16 10:48 AM  Result Value Ref Range Status   MRSA by PCR NEGATIVE NEGATIVE Final    Comment:        The GeneXpert MRSA Assay (FDA approved for NASAL specimens only), is one component of a comprehensive MRSA  colonization surveillance program. It is not intended to diagnose MRSA infection nor to guide or monitor treatment for MRSA infections.   Culture, Urine     Status: Abnormal (Preliminary result)   Collection Time: 12/23/16  3:46 PM  Result Value Ref Range Status   Specimen Description URINE, CLEAN CATCH  Final   Special Requests NONE  Final   Culture (A)  Final    >=100,000 COLONIES/mL SALMONELLA SPECIES 50,000 COLONIES/mL ESCHERICHIA COLI Referred to [facility] in [place], [place] for Serotyping. HEALTH DEPARTMENT NOTIFIED    Report Status PENDING  Incomplete   Organism ID, Bacteria SALMONELLA SPECIES (A)  Final   Organism ID, Bacteria ESCHERICHIA COLI (A)  Final      Susceptibility   Escherichia coli - MIC*    AMPICILLIN 4 SENSITIVE Sensitive     CEFAZOLIN <=4 SENSITIVE Sensitive     CEFTRIAXONE <=1 SENSITIVE Sensitive     CIPROFLOXACIN <=0.25 SENSITIVE Sensitive     GENTAMICIN <=1 SENSITIVE Sensitive     IMIPENEM <=0.25 SENSITIVE Sensitive     NITROFURANTOIN <=16 SENSITIVE Sensitive     TRIMETH/SULFA <=20 SENSITIVE Sensitive     AMPICILLIN/SULBACTAM <=2 SENSITIVE Sensitive     PIP/TAZO <=4 SENSITIVE Sensitive     Extended ESBL NEGATIVE Sensitive     * 50,000 COLONIES/mL ESCHERICHIA COLI   Salmonella species - MIC*    AMPICILLIN <=2 SENSITIVE Sensitive     LEVOFLOXACIN <=0.12 SENSITIVE Sensitive     TRIMETH/SULFA <=20 SENSITIVE Sensitive     * >=100,000 COLONIES/mL SALMONELLA SPECIES     Labs: Basic Metabolic Panel:  Recent Labs Lab 12/26/16 0524 12/27/16 0645 12/28/16 0656 12/29/16 0544 12/30/16 0523  NA 142 139 141 140 141  K 4.1 3.8 3.9 3.7 3.5  CL 113* 111 112* 111 112*  CO2 23 24 23 22 24   GLUCOSE 103* 111* 122* 99 98  BUN 17 14 13 12 13   CREATININE 2.26* 2.02* 1.93* 1.85* 1.75*  CALCIUM 7.9* 7.9* 7.9* 7.7* 7.7*   CBC:  Recent Labs Lab 12/24/16 1224 12/24/16 1852  12/25/16 2131 12/26/16 0524 12/27/16 0645  12/28/16 0656 12/29/16 0544 12/30/16 0523  WBC 12.4* 15.3*  < > 13.1* 10.9* 12.5* 9.6 10.0 10.7*  NEUTROABS 9.3* 11.1*  --  8.7*  --   --   --   --   --   HGB 7.5* 9.0*  < > 8.0* 8.0* 8.2* 7.5* 7.5* 8.3*  HCT 23.7* 28.7*  < > 25.9* 26.1* 26.6* 24.7* 24.6*  27.6*  MCV 88.8 90.5  < > 92.2 91.6 91.4 92.2 91.1 88.7  PLT 171 204  < > 168 172 183 184 192 205  < > = values in this interval not displayed. Cardiac Enzymes:  Recent Labs Lab 12/24/16 0204  TROPONINI 0.06*    CBG:  Recent Labs Lab 12/25/16 0102  GLUCAP 92   SIGNED: Time coordinating discharge: 60 minutes  Faye Ramsay, MD  [facility] 12/30/2016, 9:02 AM Pager [phone]  If 7PM-7AM, please contact night-coverage [email or web address] Password [email or web address]

## 2016-12-30 NOTE — Progress Notes (Signed)
Patient will discharge to [facility] Anticipated discharge date: 8/20 Family notified: pt spouse Transportation by spouse to drive patient to facility after lunch Report #: [phone] rm 5  CSW signing off.  Jorge Ny, LCSW Clinical Social Worker [phone]

## 2016-12-30 NOTE — Progress Notes (Signed)
Physical Therapy Treatment Patient Details Name: Heather Benson MRN: [id number] DOB: 19-Sep-1931 Today's Date: 12/30/2016    History of Present Illness 83 year old woman PMH PAF on apixabanpresented with brisk rectal bleeding with clots.admitted for GI bleed.  Colonoscopy revealed sigmod and descending colon diverticulosis.  Developed SVT and treated with Lopressor and converted to NSR.    PT Comments    Patient progressing some with ambulation this session, but does have symptoms and signs of orthostatic hypotension and will need continued monitoring of these symptoms to ensure safety with mobility.  She is appropriate for SNF level rehab.  Follow Up Recommendations  SNF     Equipment Recommendations  None recommended by PT    Recommendations for Other Services       Precautions / Restrictions Precautions Precautions: Fall Precaution Comments: orhtostatic    Mobility  Bed Mobility Overal bed mobility: Needs Assistance Bed Mobility: Sit to Supine       Sit to supine: Mod assist   General bed mobility comments: guided legs into bed  Transfers Overall transfer level: Needs assistance Equipment used: Rolling walker (2 wheeled) Transfers: Sit to/from [facility] Sit to Stand: Min assist;Mod assist Stand pivot transfers: Min assist       General transfer comment: increased time, cues for hand placement, lifting help; BSC to bed with RW min A  Ambulation/Gait Ambulation/Gait assistance: Min assist Ambulation Distance (Feet): 30 Feet Assistive device: Rolling walker (2 wheeled) Gait Pattern/deviations: Step-to pattern;Step-through pattern;Decreased stride length;Trunk flexed     General Gait Details: increased time, at time shaky and anxious.  Son followed with chair   Stairs            Wheelchair Mobility    Modified Rankin (Stroke Patients Only)       Balance Overall balance assessment: Needs assistance   Sitting balance-Leahy  Scale: Fair       Standing balance-Leahy Scale: Poor                              Cognition Arousal/Alertness: Awake/alert Behavior During Therapy: WFL for tasks assessed/performed Overall Cognitive Status: Within Functional Limits for tasks assessed                                        Exercises      General Comments General comments (skin integrity, edema, etc.): assist for hygiene after toileting on [facility]      Pertinent Vitals/Pain Pain Assessment: No/denies pain    Home Living                      Prior Function            PT Goals (current goals can now be found in the care plan section) Progress towards PT goals: Progressing toward goals    Frequency    Min 3X/week      PT Plan Current plan remains appropriate    Co-evaluation              AM-PAC PT "6 Clicks" Daily Activity  Outcome Measure  Difficulty turning over in bed (including adjusting bedclothes, sheets and blankets)?: A Little Difficulty moving from lying on back to sitting on the side of the bed? : Unable Difficulty sitting down on and standing up from a chair with arms (e.g., wheelchair, bedside commode,  etc,.)?: Unable Help needed moving to and from a bed to chair (including a wheelchair)?: A Lot Help needed walking in hospital room?: A Little Help needed climbing 3-5 steps with a railing? : A Lot 6 Click Score: 12    End of Session Equipment Utilized During Treatment: Gait belt Activity Tolerance: Patient limited by fatigue Patient left: in bed;with call bell/phone within reach;with family/visitor present   PT Visit Diagnosis: Muscle weakness (generalized) (M62.81);Other abnormalities of gait and mobility (R26.89)     Time: 4469-5072 PT Time Calculation (min) (ACUTE ONLY): 30 min  Charges:  $Gait Training: 8-22 mins $Therapeutic Activity: 8-22 mins                    G CodesMagda Kiel, [place] [phone] 12/30/2016    Reginia Naas 12/30/2016, 2:39 PM

## 2017-01-17 ENCOUNTER — Telehealth: Payer: Self-pay | Admitting: Pharmacist

## 2017-01-17 DIAGNOSIS — Z8719 Personal history of other diseases of the digestive system: Secondary | ICD-10-CM | POA: Diagnosis not present

## 2017-01-17 DIAGNOSIS — M1711 Unilateral primary osteoarthritis, right knee: Secondary | ICD-10-CM | POA: Diagnosis not present

## 2017-01-17 DIAGNOSIS — R600 Localized edema: Secondary | ICD-10-CM | POA: Diagnosis not present

## 2017-01-17 DIAGNOSIS — Z87440 Personal history of urinary (tract) infections: Secondary | ICD-10-CM | POA: Diagnosis not present

## 2017-01-17 DIAGNOSIS — I480 Paroxysmal atrial fibrillation: Secondary | ICD-10-CM | POA: Diagnosis not present

## 2017-01-17 DIAGNOSIS — I2510 Atherosclerotic heart disease of native coronary artery without angina pectoris: Secondary | ICD-10-CM | POA: Diagnosis not present

## 2017-01-17 NOTE — Telephone Encounter (Signed)
Pt called into anticoag clinic about restarting her Eliquis. She reports that she was just discharged from nursing facility after admission to hospital for bleeding. She reports that she did not receive any anticoagulation while in nursing facility, but was told to call our office to determine when/if she should resume Eliquis. She denies any current bleeding signs/symptoms. Advised I would route to Dr. Irish Lack for recommendation on resumption of Eliquis.

## 2017-01-18 DIAGNOSIS — Z8719 Personal history of other diseases of the digestive system: Secondary | ICD-10-CM | POA: Diagnosis not present

## 2017-01-18 DIAGNOSIS — M1711 Unilateral primary osteoarthritis, right knee: Secondary | ICD-10-CM | POA: Diagnosis not present

## 2017-01-18 DIAGNOSIS — I2510 Atherosclerotic heart disease of native coronary artery without angina pectoris: Secondary | ICD-10-CM | POA: Diagnosis not present

## 2017-01-18 DIAGNOSIS — I480 Paroxysmal atrial fibrillation: Secondary | ICD-10-CM | POA: Diagnosis not present

## 2017-01-18 DIAGNOSIS — R600 Localized edema: Secondary | ICD-10-CM | POA: Diagnosis not present

## 2017-01-18 DIAGNOSIS — Z87440 Personal history of urinary (tract) infections: Secondary | ICD-10-CM | POA: Diagnosis not present

## 2017-01-20 DIAGNOSIS — M1711 Unilateral primary osteoarthritis, right knee: Secondary | ICD-10-CM | POA: Diagnosis not present

## 2017-01-20 DIAGNOSIS — Z87440 Personal history of urinary (tract) infections: Secondary | ICD-10-CM | POA: Diagnosis not present

## 2017-01-20 DIAGNOSIS — R600 Localized edema: Secondary | ICD-10-CM | POA: Diagnosis not present

## 2017-01-20 DIAGNOSIS — Z8719 Personal history of other diseases of the digestive system: Secondary | ICD-10-CM | POA: Diagnosis not present

## 2017-01-20 DIAGNOSIS — I480 Paroxysmal atrial fibrillation: Secondary | ICD-10-CM | POA: Diagnosis not present

## 2017-01-20 DIAGNOSIS — I2510 Atherosclerotic heart disease of native coronary artery without angina pectoris: Secondary | ICD-10-CM | POA: Diagnosis not present

## 2017-01-20 NOTE — Telephone Encounter (Signed)
Dr. Penelope Coop,  Will restart Eliquis today.  It has been 4 weeks since her colonoscopy showing likely diverticular bleed.  Please let me know if you have an objection. Thanks.

## 2017-01-20 NOTE — Telephone Encounter (Signed)
Pt called and is concerned about not being on Eliquis as she has been on hold since GI Bleed Spoke with Dr Hassell Done nurse Drue Novel and she states she will text Dr Irish Lack and will call pt with any updated information Also she states Dr Irish Lack will be in Cath Lab tomorrow and will be in office on Wednesday, This nurse called pt and gave her the above information and she states understanding

## 2017-01-20 NOTE — Telephone Encounter (Signed)
Patient made aware that she can restart her Eliquis. Patient denies any recurrent episodes of bleeding. Made patient aware that the information has been sent to Dr. Penelope Coop for review and we will let her know if Dr. Penelope Coop has any objections. Patient advised to let us know if she has any signs or symptoms of bleeding. Patient verbalized understanding and thanked me for the call.

## 2017-01-21 DIAGNOSIS — Z8719 Personal history of other diseases of the digestive system: Secondary | ICD-10-CM | POA: Diagnosis not present

## 2017-01-21 DIAGNOSIS — Z87440 Personal history of urinary (tract) infections: Secondary | ICD-10-CM | POA: Diagnosis not present

## 2017-01-21 DIAGNOSIS — M1711 Unilateral primary osteoarthritis, right knee: Secondary | ICD-10-CM | POA: Diagnosis not present

## 2017-01-21 DIAGNOSIS — I2510 Atherosclerotic heart disease of native coronary artery without angina pectoris: Secondary | ICD-10-CM | POA: Diagnosis not present

## 2017-01-21 DIAGNOSIS — I480 Paroxysmal atrial fibrillation: Secondary | ICD-10-CM | POA: Diagnosis not present

## 2017-01-21 DIAGNOSIS — R600 Localized edema: Secondary | ICD-10-CM | POA: Diagnosis not present

## 2017-01-22 DIAGNOSIS — Z7901 Long term (current) use of anticoagulants: Secondary | ICD-10-CM | POA: Diagnosis not present

## 2017-01-22 DIAGNOSIS — Z23 Encounter for immunization: Secondary | ICD-10-CM | POA: Diagnosis not present

## 2017-01-22 DIAGNOSIS — I129 Hypertensive chronic kidney disease with stage 1 through stage 4 chronic kidney disease, or unspecified chronic kidney disease: Secondary | ICD-10-CM | POA: Diagnosis not present

## 2017-01-22 DIAGNOSIS — I480 Paroxysmal atrial fibrillation: Secondary | ICD-10-CM | POA: Diagnosis not present

## 2017-01-22 DIAGNOSIS — K5731 Diverticulosis of large intestine without perforation or abscess with bleeding: Secondary | ICD-10-CM | POA: Diagnosis not present

## 2017-01-22 DIAGNOSIS — N184 Chronic kidney disease, stage 4 (severe): Secondary | ICD-10-CM | POA: Diagnosis not present

## 2017-01-22 DIAGNOSIS — R600 Localized edema: Secondary | ICD-10-CM | POA: Diagnosis not present

## 2017-01-23 ENCOUNTER — Telehealth: Payer: Self-pay

## 2017-01-23 DIAGNOSIS — I2510 Atherosclerotic heart disease of native coronary artery without angina pectoris: Secondary | ICD-10-CM | POA: Diagnosis not present

## 2017-01-23 DIAGNOSIS — I480 Paroxysmal atrial fibrillation: Secondary | ICD-10-CM | POA: Diagnosis not present

## 2017-01-23 DIAGNOSIS — M1711 Unilateral primary osteoarthritis, right knee: Secondary | ICD-10-CM | POA: Diagnosis not present

## 2017-01-23 DIAGNOSIS — Z8719 Personal history of other diseases of the digestive system: Secondary | ICD-10-CM | POA: Diagnosis not present

## 2017-01-23 DIAGNOSIS — R600 Localized edema: Secondary | ICD-10-CM | POA: Diagnosis not present

## 2017-01-23 DIAGNOSIS — Z87440 Personal history of urinary (tract) infections: Secondary | ICD-10-CM | POA: Diagnosis not present

## 2017-01-23 NOTE — Telephone Encounter (Signed)
SENT NOTES TO SCHEDULING 

## 2017-01-24 DIAGNOSIS — M1711 Unilateral primary osteoarthritis, right knee: Secondary | ICD-10-CM | POA: Diagnosis not present

## 2017-01-24 DIAGNOSIS — Z8719 Personal history of other diseases of the digestive system: Secondary | ICD-10-CM | POA: Diagnosis not present

## 2017-01-24 DIAGNOSIS — Z87440 Personal history of urinary (tract) infections: Secondary | ICD-10-CM | POA: Diagnosis not present

## 2017-01-24 DIAGNOSIS — I2510 Atherosclerotic heart disease of native coronary artery without angina pectoris: Secondary | ICD-10-CM | POA: Diagnosis not present

## 2017-01-24 DIAGNOSIS — I480 Paroxysmal atrial fibrillation: Secondary | ICD-10-CM | POA: Diagnosis not present

## 2017-01-24 DIAGNOSIS — R600 Localized edema: Secondary | ICD-10-CM | POA: Diagnosis not present

## 2017-01-27 DIAGNOSIS — M1711 Unilateral primary osteoarthritis, right knee: Secondary | ICD-10-CM | POA: Diagnosis not present

## 2017-01-27 DIAGNOSIS — Z8719 Personal history of other diseases of the digestive system: Secondary | ICD-10-CM | POA: Diagnosis not present

## 2017-01-27 DIAGNOSIS — I480 Paroxysmal atrial fibrillation: Secondary | ICD-10-CM | POA: Diagnosis not present

## 2017-01-27 DIAGNOSIS — I2510 Atherosclerotic heart disease of native coronary artery without angina pectoris: Secondary | ICD-10-CM | POA: Diagnosis not present

## 2017-01-27 DIAGNOSIS — R600 Localized edema: Secondary | ICD-10-CM | POA: Diagnosis not present

## 2017-01-27 DIAGNOSIS — Z87440 Personal history of urinary (tract) infections: Secondary | ICD-10-CM | POA: Diagnosis not present

## 2017-01-28 DIAGNOSIS — L820 Inflamed seborrheic keratosis: Secondary | ICD-10-CM | POA: Diagnosis not present

## 2017-01-28 DIAGNOSIS — Z85820 Personal history of malignant melanoma of skin: Secondary | ICD-10-CM | POA: Diagnosis not present

## 2017-01-28 DIAGNOSIS — L821 Other seborrheic keratosis: Secondary | ICD-10-CM | POA: Diagnosis not present

## 2017-01-28 DIAGNOSIS — Z85828 Personal history of other malignant neoplasm of skin: Secondary | ICD-10-CM | POA: Diagnosis not present

## 2017-01-28 DIAGNOSIS — L570 Actinic keratosis: Secondary | ICD-10-CM | POA: Diagnosis not present

## 2017-01-29 DIAGNOSIS — I2510 Atherosclerotic heart disease of native coronary artery without angina pectoris: Secondary | ICD-10-CM | POA: Diagnosis not present

## 2017-01-29 DIAGNOSIS — I480 Paroxysmal atrial fibrillation: Secondary | ICD-10-CM | POA: Diagnosis not present

## 2017-01-29 DIAGNOSIS — Z87440 Personal history of urinary (tract) infections: Secondary | ICD-10-CM | POA: Diagnosis not present

## 2017-01-29 DIAGNOSIS — R600 Localized edema: Secondary | ICD-10-CM | POA: Diagnosis not present

## 2017-01-29 DIAGNOSIS — M1711 Unilateral primary osteoarthritis, right knee: Secondary | ICD-10-CM | POA: Diagnosis not present

## 2017-01-29 DIAGNOSIS — Z8719 Personal history of other diseases of the digestive system: Secondary | ICD-10-CM | POA: Diagnosis not present

## 2017-01-30 DIAGNOSIS — Z8719 Personal history of other diseases of the digestive system: Secondary | ICD-10-CM | POA: Diagnosis not present

## 2017-01-30 DIAGNOSIS — M1711 Unilateral primary osteoarthritis, right knee: Secondary | ICD-10-CM | POA: Diagnosis not present

## 2017-01-30 DIAGNOSIS — Z87440 Personal history of urinary (tract) infections: Secondary | ICD-10-CM | POA: Diagnosis not present

## 2017-01-30 DIAGNOSIS — I2510 Atherosclerotic heart disease of native coronary artery without angina pectoris: Secondary | ICD-10-CM | POA: Diagnosis not present

## 2017-01-30 DIAGNOSIS — I480 Paroxysmal atrial fibrillation: Secondary | ICD-10-CM | POA: Diagnosis not present

## 2017-01-30 DIAGNOSIS — R600 Localized edema: Secondary | ICD-10-CM | POA: Diagnosis not present

## 2017-02-03 DIAGNOSIS — M1711 Unilateral primary osteoarthritis, right knee: Secondary | ICD-10-CM | POA: Diagnosis not present

## 2017-02-03 DIAGNOSIS — R600 Localized edema: Secondary | ICD-10-CM | POA: Diagnosis not present

## 2017-02-03 DIAGNOSIS — Z8719 Personal history of other diseases of the digestive system: Secondary | ICD-10-CM | POA: Diagnosis not present

## 2017-02-03 DIAGNOSIS — I480 Paroxysmal atrial fibrillation: Secondary | ICD-10-CM | POA: Diagnosis not present

## 2017-02-03 DIAGNOSIS — I2510 Atherosclerotic heart disease of native coronary artery without angina pectoris: Secondary | ICD-10-CM | POA: Diagnosis not present

## 2017-02-03 DIAGNOSIS — Z87440 Personal history of urinary (tract) infections: Secondary | ICD-10-CM | POA: Diagnosis not present

## 2017-02-04 DIAGNOSIS — E876 Hypokalemia: Secondary | ICD-10-CM | POA: Diagnosis not present

## 2017-02-04 DIAGNOSIS — K922 Gastrointestinal hemorrhage, unspecified: Secondary | ICD-10-CM | POA: Diagnosis not present

## 2017-02-04 DIAGNOSIS — D62 Acute posthemorrhagic anemia: Secondary | ICD-10-CM | POA: Diagnosis not present

## 2017-02-04 DIAGNOSIS — I10 Essential (primary) hypertension: Secondary | ICD-10-CM | POA: Diagnosis not present

## 2017-02-04 DIAGNOSIS — N184 Chronic kidney disease, stage 4 (severe): Secondary | ICD-10-CM | POA: Diagnosis not present

## 2017-02-04 DIAGNOSIS — D869 Sarcoidosis, unspecified: Secondary | ICD-10-CM | POA: Diagnosis not present

## 2017-02-04 DIAGNOSIS — I480 Paroxysmal atrial fibrillation: Secondary | ICD-10-CM | POA: Diagnosis not present

## 2017-02-04 LAB — URINE CULTURE

## 2017-02-05 DIAGNOSIS — Z8719 Personal history of other diseases of the digestive system: Secondary | ICD-10-CM | POA: Diagnosis not present

## 2017-02-05 DIAGNOSIS — Z87440 Personal history of urinary (tract) infections: Secondary | ICD-10-CM | POA: Diagnosis not present

## 2017-02-05 DIAGNOSIS — M1711 Unilateral primary osteoarthritis, right knee: Secondary | ICD-10-CM | POA: Diagnosis not present

## 2017-02-05 DIAGNOSIS — R600 Localized edema: Secondary | ICD-10-CM | POA: Diagnosis not present

## 2017-02-05 DIAGNOSIS — I2510 Atherosclerotic heart disease of native coronary artery without angina pectoris: Secondary | ICD-10-CM | POA: Diagnosis not present

## 2017-02-05 DIAGNOSIS — I480 Paroxysmal atrial fibrillation: Secondary | ICD-10-CM | POA: Diagnosis not present

## 2017-02-06 DIAGNOSIS — I2510 Atherosclerotic heart disease of native coronary artery without angina pectoris: Secondary | ICD-10-CM | POA: Diagnosis not present

## 2017-02-06 DIAGNOSIS — R600 Localized edema: Secondary | ICD-10-CM | POA: Diagnosis not present

## 2017-02-06 DIAGNOSIS — Z87440 Personal history of urinary (tract) infections: Secondary | ICD-10-CM | POA: Diagnosis not present

## 2017-02-06 DIAGNOSIS — Z8719 Personal history of other diseases of the digestive system: Secondary | ICD-10-CM | POA: Diagnosis not present

## 2017-02-06 DIAGNOSIS — M1711 Unilateral primary osteoarthritis, right knee: Secondary | ICD-10-CM | POA: Diagnosis not present

## 2017-02-06 DIAGNOSIS — I480 Paroxysmal atrial fibrillation: Secondary | ICD-10-CM | POA: Diagnosis not present

## 2017-02-07 DIAGNOSIS — M1711 Unilateral primary osteoarthritis, right knee: Secondary | ICD-10-CM | POA: Diagnosis not present

## 2017-02-07 DIAGNOSIS — Z87440 Personal history of urinary (tract) infections: Secondary | ICD-10-CM | POA: Diagnosis not present

## 2017-02-07 DIAGNOSIS — I2510 Atherosclerotic heart disease of native coronary artery without angina pectoris: Secondary | ICD-10-CM | POA: Diagnosis not present

## 2017-02-07 DIAGNOSIS — Z8719 Personal history of other diseases of the digestive system: Secondary | ICD-10-CM | POA: Diagnosis not present

## 2017-02-07 DIAGNOSIS — R600 Localized edema: Secondary | ICD-10-CM | POA: Diagnosis not present

## 2017-02-07 DIAGNOSIS — I480 Paroxysmal atrial fibrillation: Secondary | ICD-10-CM | POA: Diagnosis not present

## 2017-02-10 DIAGNOSIS — I2510 Atherosclerotic heart disease of native coronary artery without angina pectoris: Secondary | ICD-10-CM | POA: Diagnosis not present

## 2017-02-10 DIAGNOSIS — R600 Localized edema: Secondary | ICD-10-CM | POA: Diagnosis not present

## 2017-02-10 DIAGNOSIS — Z8719 Personal history of other diseases of the digestive system: Secondary | ICD-10-CM | POA: Diagnosis not present

## 2017-02-10 DIAGNOSIS — M1711 Unilateral primary osteoarthritis, right knee: Secondary | ICD-10-CM | POA: Diagnosis not present

## 2017-02-10 DIAGNOSIS — Z87440 Personal history of urinary (tract) infections: Secondary | ICD-10-CM | POA: Diagnosis not present

## 2017-02-10 DIAGNOSIS — I480 Paroxysmal atrial fibrillation: Secondary | ICD-10-CM | POA: Diagnosis not present

## 2017-02-11 DIAGNOSIS — Z8719 Personal history of other diseases of the digestive system: Secondary | ICD-10-CM | POA: Diagnosis not present

## 2017-02-11 DIAGNOSIS — R600 Localized edema: Secondary | ICD-10-CM | POA: Diagnosis not present

## 2017-02-11 DIAGNOSIS — M1711 Unilateral primary osteoarthritis, right knee: Secondary | ICD-10-CM | POA: Diagnosis not present

## 2017-02-11 DIAGNOSIS — I480 Paroxysmal atrial fibrillation: Secondary | ICD-10-CM | POA: Diagnosis not present

## 2017-02-11 DIAGNOSIS — Z87440 Personal history of urinary (tract) infections: Secondary | ICD-10-CM | POA: Diagnosis not present

## 2017-02-11 DIAGNOSIS — I2510 Atherosclerotic heart disease of native coronary artery without angina pectoris: Secondary | ICD-10-CM | POA: Diagnosis not present

## 2017-02-13 DIAGNOSIS — I2510 Atherosclerotic heart disease of native coronary artery without angina pectoris: Secondary | ICD-10-CM | POA: Diagnosis not present

## 2017-02-13 DIAGNOSIS — I480 Paroxysmal atrial fibrillation: Secondary | ICD-10-CM | POA: Diagnosis not present

## 2017-02-13 DIAGNOSIS — M1711 Unilateral primary osteoarthritis, right knee: Secondary | ICD-10-CM | POA: Diagnosis not present

## 2017-02-13 DIAGNOSIS — R600 Localized edema: Secondary | ICD-10-CM | POA: Diagnosis not present

## 2017-02-13 DIAGNOSIS — Z87440 Personal history of urinary (tract) infections: Secondary | ICD-10-CM | POA: Diagnosis not present

## 2017-02-13 DIAGNOSIS — Z8719 Personal history of other diseases of the digestive system: Secondary | ICD-10-CM | POA: Diagnosis not present

## 2017-02-17 ENCOUNTER — Other Ambulatory Visit (HOSPITAL_COMMUNITY): Payer: Self-pay | Admitting: *Deleted

## 2017-02-17 DIAGNOSIS — Z8719 Personal history of other diseases of the digestive system: Secondary | ICD-10-CM | POA: Diagnosis not present

## 2017-02-17 DIAGNOSIS — M1711 Unilateral primary osteoarthritis, right knee: Secondary | ICD-10-CM | POA: Diagnosis not present

## 2017-02-17 DIAGNOSIS — R600 Localized edema: Secondary | ICD-10-CM | POA: Diagnosis not present

## 2017-02-17 DIAGNOSIS — Z87440 Personal history of urinary (tract) infections: Secondary | ICD-10-CM | POA: Diagnosis not present

## 2017-02-17 DIAGNOSIS — I480 Paroxysmal atrial fibrillation: Secondary | ICD-10-CM | POA: Diagnosis not present

## 2017-02-17 DIAGNOSIS — I2510 Atherosclerotic heart disease of native coronary artery without angina pectoris: Secondary | ICD-10-CM | POA: Diagnosis not present

## 2017-02-18 ENCOUNTER — Ambulatory Visit (HOSPITAL_COMMUNITY)
Admission: RE | Admit: 2017-02-18 | Discharge: 2017-02-18 | Disposition: A | Discharge status: Home / Self Care | Payer: Medicare Other | Source: Ambulatory Visit | Attending: Nephrology | Admitting: Nephrology

## 2017-02-18 DIAGNOSIS — N189 Chronic kidney disease, unspecified: Secondary | ICD-10-CM | POA: Diagnosis not present

## 2017-02-18 DIAGNOSIS — D631 Anemia in chronic kidney disease: Secondary | ICD-10-CM | POA: Diagnosis not present

## 2017-02-18 MED ORDER — SODIUM CHLORIDE 0.9 % IV SOLN
510.0000 mg | INTRAVENOUS | Status: DC
  Administered 2017-02-18: 11:00:00 510 mg via INTRAVENOUS
  Filled 2017-02-18: qty 17

## 2017-02-18 NOTE — Discharge Instructions (Signed)

## 2017-02-20 DIAGNOSIS — I480 Paroxysmal atrial fibrillation: Secondary | ICD-10-CM | POA: Diagnosis not present

## 2017-02-20 DIAGNOSIS — R600 Localized edema: Secondary | ICD-10-CM | POA: Diagnosis not present

## 2017-02-20 DIAGNOSIS — M1711 Unilateral primary osteoarthritis, right knee: Secondary | ICD-10-CM | POA: Diagnosis not present

## 2017-02-20 DIAGNOSIS — Z8719 Personal history of other diseases of the digestive system: Secondary | ICD-10-CM | POA: Diagnosis not present

## 2017-02-20 DIAGNOSIS — Z87440 Personal history of urinary (tract) infections: Secondary | ICD-10-CM | POA: Diagnosis not present

## 2017-02-20 DIAGNOSIS — I2510 Atherosclerotic heart disease of native coronary artery without angina pectoris: Secondary | ICD-10-CM | POA: Diagnosis not present

## 2017-02-24 ENCOUNTER — Other Ambulatory Visit (HOSPITAL_COMMUNITY): Payer: Self-pay | Admitting: *Deleted

## 2017-02-25 ENCOUNTER — Ambulatory Visit (HOSPITAL_COMMUNITY)
Admission: RE | Admit: 2017-02-25 | Discharge: 2017-02-25 | Disposition: A | Discharge status: Home / Self Care | Payer: Medicare Other | Source: Ambulatory Visit | Attending: Nephrology | Admitting: Nephrology

## 2017-02-25 DIAGNOSIS — D631 Anemia in chronic kidney disease: Secondary | ICD-10-CM | POA: Insufficient documentation

## 2017-02-25 MED ORDER — SODIUM CHLORIDE 0.9 % IV SOLN
510.0000 mg | INTRAVENOUS | Status: AC
  Administered 2017-02-25: 11:00:00 510 mg via INTRAVENOUS
  Filled 2017-02-25: qty 17

## 2017-02-26 DIAGNOSIS — I2510 Atherosclerotic heart disease of native coronary artery without angina pectoris: Secondary | ICD-10-CM | POA: Diagnosis not present

## 2017-02-26 DIAGNOSIS — M1711 Unilateral primary osteoarthritis, right knee: Secondary | ICD-10-CM | POA: Diagnosis not present

## 2017-02-26 DIAGNOSIS — I480 Paroxysmal atrial fibrillation: Secondary | ICD-10-CM | POA: Diagnosis not present

## 2017-02-26 DIAGNOSIS — Z8719 Personal history of other diseases of the digestive system: Secondary | ICD-10-CM | POA: Diagnosis not present

## 2017-02-26 DIAGNOSIS — R600 Localized edema: Secondary | ICD-10-CM | POA: Diagnosis not present

## 2017-02-26 DIAGNOSIS — Z87440 Personal history of urinary (tract) infections: Secondary | ICD-10-CM | POA: Diagnosis not present

## 2017-02-27 DIAGNOSIS — H401211 Low-tension glaucoma, right eye, mild stage: Secondary | ICD-10-CM | POA: Diagnosis not present

## 2017-02-28 DIAGNOSIS — Z87440 Personal history of urinary (tract) infections: Secondary | ICD-10-CM | POA: Diagnosis not present

## 2017-02-28 DIAGNOSIS — Z8719 Personal history of other diseases of the digestive system: Secondary | ICD-10-CM | POA: Diagnosis not present

## 2017-02-28 DIAGNOSIS — M1711 Unilateral primary osteoarthritis, right knee: Secondary | ICD-10-CM | POA: Diagnosis not present

## 2017-02-28 DIAGNOSIS — I2510 Atherosclerotic heart disease of native coronary artery without angina pectoris: Secondary | ICD-10-CM | POA: Diagnosis not present

## 2017-02-28 DIAGNOSIS — I480 Paroxysmal atrial fibrillation: Secondary | ICD-10-CM | POA: Diagnosis not present

## 2017-02-28 DIAGNOSIS — R600 Localized edema: Secondary | ICD-10-CM | POA: Diagnosis not present

## 2017-03-04 DIAGNOSIS — M1711 Unilateral primary osteoarthritis, right knee: Secondary | ICD-10-CM | POA: Diagnosis not present

## 2017-03-04 DIAGNOSIS — R600 Localized edema: Secondary | ICD-10-CM | POA: Diagnosis not present

## 2017-03-04 DIAGNOSIS — Z87440 Personal history of urinary (tract) infections: Secondary | ICD-10-CM | POA: Diagnosis not present

## 2017-03-04 DIAGNOSIS — I480 Paroxysmal atrial fibrillation: Secondary | ICD-10-CM | POA: Diagnosis not present

## 2017-03-04 DIAGNOSIS — I2510 Atherosclerotic heart disease of native coronary artery without angina pectoris: Secondary | ICD-10-CM | POA: Diagnosis not present

## 2017-03-04 DIAGNOSIS — Z8719 Personal history of other diseases of the digestive system: Secondary | ICD-10-CM | POA: Diagnosis not present

## 2017-03-04 NOTE — Progress Notes (Signed)
Cardiology Office Note   Date:  03/05/2017   ID:  Heather Benson, DOB [birth year], MRN [id number]  PCP:  [name], MD    No chief complaint on file. AFib   Wt Readings from Last 3 Encounters:  03/05/17 189 lb 9.6 oz (86 kg)  02/25/17 210 lb (95.3 kg)  02/18/17 186 lb (84.4 kg)       History of Present Illness: Heather Benson is a 83 y.o. female  with a hx of paroxysmal atrial fibrillation, HTN, LE edema, GERD. She was seen in February 2016 and was back in atrial fibrillation. She was placed on amiodarone and eventually underwent cardioversion 08/05/14. She was seen in follow-up on 08/23/14. She felt no different and continued to have dizziness as well as intermittent palpitations. Dizziness was worse with standing up. She denied syncope. She was noted to be bradycardic and her bisoprolol was decreased.   In 5/16, she Continued to c/o dizziness and was bradycardic. Her beta-blocker was stopped. FU ECG with improved HR.   Her heart rate improved. However, she remained dizzy. She was seen by outpatient neuro physical therapy as well as neurology. Testing for BPPV was negative. She did have orthostatic hypotension documented at least once.   She has been hospitalized for PNA in 8/17.  She was there for 2 day and was quite sick.   Renal dysfunction has been an issue.  SHe does not want to do dialysis if it was ever needed.     She was hospitalized for GI bleed, diverticulosis.  She was found to have a UTI. She was sent to [facility], after receiving a transfusion in the hospital.  She was volume overloaded and has been treated with Lasix and spironolactone.    Lasix has been adjusted with Dr. Lorrene Reid.  She is currently taking 40 mg daily.    She is back on Eliquis.  No bleeding issues.  Fatigue persists.  SHe still has HHPT for a few more weeks.  Iron infusions for anemia.        Past Medical History:  Diagnosis Date  . Aortic atherosclerosis ([place]) 12/25/2016    . Aortic stenosis, mild 12/14/2015  . Arthritis 08-28-11   right knee pain-surgery planned  . Cancer [facility]) 08-28-11   Melonoma-cheek(many Yrs ago) , recent bx. left  face lesion, past skin cancer lesions  . CAP (community acquired pneumonia) 12/13/2015  . CHF (congestive heart failure) ([place])   . Complication of anesthesia 08-28-11   in past arrythmia-  cardiology has seen in past  . Dysrhythmia 08-28-11   only immed.  to several days after surgery- hx. A.fib. in past  . GERD (gastroesophageal reflux disease) 08-28-11   tx. omeprazole  . Hypercholesterolemia   . Hypertension 08-28-11   tx. meds  . IBS (irritable bowel syndrome)   . Lymphedema   . Neuromuscular disorder ([place]) 08-28-11   hx. Polio age 57-slight residual affects legs  . Osteoarthritis    back and left knee  . Osteoporosis   . Pneumonia 12/2015   hospital x 3 days  . PONV (postoperative nausea and vomiting)   . Renal cyst, right   . Sarcoidosis   . Swelling of extremity 08-28-11   bilateral lower extremities- mid calf down    Past Surgical History:  Procedure Laterality Date  . ABDOMINAL HYSTERECTOMY  08-28-11   Partial-vaginal approach  . BREAST REDUCTION SURGERY  1997  . BUNIONECTOMY  08-28-11   right  . CARDIOVERSION N/A  08/05/2014   Procedure: CARDIOVERSION;  Surgeon: Larey Dresser, MD;  Location: [place];  Service: Cardiovascular;  Laterality: N/A;  . CATARACT EXTRACTION, BILATERAL  08-28-11   bilateral  . COLONOSCOPY WITH PROPOFOL N/A 12/24/2016   Procedure: COLONOSCOPY WITH PROPOFOL;  Surgeon: Wonda Horner, MD;  Location: [facility] ENDOSCOPY;  Service: Endoscopy;  Laterality: N/A;  . EYE SURGERY     Cataract  . JOINT REPLACEMENT  Oct. 1, 2012   Right Knee  . KNEE ARTHROSCOPY  09/06/2011   Procedure: ARTHROSCOPY KNEE;  Surgeon: Gearlean Alf, MD;  Location: [facility];  Service: Orthopedics;  Laterality: Right;  with Synovectomy     Current Outpatient Prescriptions  Medication Sig Dispense Refill  .  acetaminophen (TYLENOL) 500 MG tablet Take 1,000 mg by mouth every 8 (eight) hours as needed for moderate pain (pain in legs).    [name] amiodarone (PACERONE) 200 MG tablet TAKE 1 TABLET (200 MG TOTAL) BY MOUTH DAILY. 90 tablet 3  . beclomethasone (QVAR) 80 MCG/ACT inhaler Inhale 2 puffs into the lungs 2 (two) times daily.    [name] ELIQUIS 2.5 MG TABS tablet TAKE 1 TABLET BY MOUTH TWICE A DAY 60 tablet 9  . fexofenadine (ALLEGRA) 180 MG tablet Take 180 mg by mouth daily.    . fluticasone (FLONASE) 50 MCG/ACT nasal spray Place 1 spray into both nostrils daily as needed for allergies.     . furosemide (LASIX) 40 MG tablet Take 20 mg by mouth daily.    [name] guaiFENesin (MUCINEX) 600 MG 12 hr tablet Take 600 mg by mouth 2 (two) times daily.    [name] latanoprost (XALATAN) 0.005 % ophthalmic solution Place 1 drop into both eyes at bedtime.  3  . meclizine (ANTIVERT) 25 MG tablet Take 12.5 mg by mouth 3 (three) times daily as needed for dizziness.   0  . predniSONE (DELTASONE) 10 MG tablet Take 10 mg by mouth daily with breakfast.    . Respiratory Therapy Supplies (FLUTTER) DEVI Use as directed. 1 each 0  . spironolactone (ALDACTONE) 25 MG tablet Take 12.5 mg by mouth daily.  0   No current facility-administered medications for this visit.     Allergies:   Augmentin [amoxicillin-pot clavulanate]; Boniva [ibandronic acid]; Hydromorphone; Latex; Lisinopril; Codeine; and Tape    Social History:  The patient  reports that she has never smoked. She has never used smokeless tobacco. She reports that she does not drink alcohol or use drugs.   Family History:  The patient'sfamily history includes Breast cancer in her sister; Colon cancer in her mother; Emphysema in her father; Heart attack in her brother; Heart disease in her father and mother; Hyperlipidemia in her mother; Hypertension in her father and mother; Stroke in her maternal grandmother.    ROS:  Please see the history of present illness.   Otherwise, review of  systems are positive for fatigue.   All other systems are reviewed and negative.    PHYSICAL EXAM: VS:  BP 128/72   Pulse (!) 58   Ht 5\' 6"  (1.676 m)   Wt 189 lb 9.6 oz (86 kg)   SpO2 92%   BMI 30.60 kg/m  , BMI Body mass index is 30.6 kg/m. GEN: Well nourished, well developed, in no acute distress  HEENT: normal  Neck: no JVD, carotid bruits, or masses Cardiac: RRR; no murmurs, rubs, or gallops,no edema  Respiratory:  clear to auscultation bilaterally, normal work of breathing GI: soft, nontender, nondistended, + BS MS:  no deformity or atrophy  Skin: warm and dry, no rash Neuro:  Strength and sensation are intact Psych: euthymic mood, full affect   EKG:   The ekg ordered 8/18 demonstrates SVT   Recent Labs: 12/22/2016: ALT 20 12/23/2016: Magnesium 2.1 12/30/2016: BUN 13; Creatinine, Ser 1.75; Hemoglobin 8.3; Platelets 205; Potassium 3.5; Sodium 141   Lipid Panel    Component Value Date/Time   CHOL  05/27/2010 0505    168        ATP III CLASSIFICATION:  <200     mg/dL   Desirable  200-239  mg/dL   Borderline High  >=240    mg/dL   High          TRIG 95 05/27/2010 0505   HDL 51 05/27/2010 0505   CHOLHDL 3.3 05/27/2010 0505   VLDL 19 05/27/2010 0505   LDLCALC  05/27/2010 0505    98        Total Cholesterol/HDL:CHD Risk Coronary Heart Disease Risk Table                     Men   Women  1/2 Average Risk   3.4   3.3  Average Risk       5.0   4.4  2 X Average Risk   9.6   7.1  3 X Average Risk  23.4   11.0        Use the calculated Patient Ratio above and the CHD Risk Table to determine the patient's CHD Risk.        ATP III CLASSIFICATION (LDL):  <100     mg/dL   Optimal  100-129  mg/dL   Near or Above                    Optimal  130-159  mg/dL   Borderline  160-189  mg/dL   High  >190     mg/dL   Very High     Other studies Reviewed: Additional studies/ records that were reviewed today with results demonstrating: EF 55-60% in 8/17.   ASSESSMENT AND  PLAN:  1. AFib: Continue amiodarone.  Maintiaining NSR.  She had some SVT noted in August.  She is now off metoprolol.  Would have to add back metoprolol if arrhythmia returns. 2. Chronic diastolic heart failure: She was volume overloaded.  She appears euvolemic now.  Stop spironolactone.  Continue furosemide.  Check electrolytes in 1 week.  May need to add potassium. 3. Anticoagulated: She is back on low-dose Eliquis.  Continue this for now.  If she has more bleeding, would have to reconsider. 4. Fatigue/DOE: Continue home health physical therapy to try and help.  Now off of beta blocker.   Current medicines are reviewed at length with the patient today.  The patient concerns regarding her medicines were addressed.  The following changes have been made:  Stop aldactone  Labs/ tests ordered today include:  No orders of the defined types were placed in this encounter.   Recommend 150 minutes/week of aerobic exercise Low fat, low carb, high fiber diet recommended  Disposition:   FU in 1 year   Signed, Larae Grooms, MD  03/05/2017 9:08 AM    [facility] [place], [place], [place]  [address] Phone: [phone]; Fax: [phone]

## 2017-03-05 ENCOUNTER — Ambulatory Visit (INDEPENDENT_AMBULATORY_CARE_PROVIDER_SITE_OTHER): Payer: Medicare Other | Admitting: Interventional Cardiology

## 2017-03-05 ENCOUNTER — Encounter: Payer: Self-pay | Admitting: Interventional Cardiology

## 2017-03-05 VITALS — BP 128/72 | HR 58 | Ht 66.0 in | Wt 189.6 lb

## 2017-03-05 DIAGNOSIS — I5032 Chronic diastolic (congestive) heart failure: Secondary | ICD-10-CM | POA: Diagnosis not present

## 2017-03-05 DIAGNOSIS — N184 Chronic kidney disease, stage 4 (severe): Secondary | ICD-10-CM | POA: Diagnosis not present

## 2017-03-05 DIAGNOSIS — R600 Localized edema: Secondary | ICD-10-CM

## 2017-03-05 DIAGNOSIS — I480 Paroxysmal atrial fibrillation: Secondary | ICD-10-CM | POA: Diagnosis not present

## 2017-03-05 DIAGNOSIS — R0609 Other forms of dyspnea: Secondary | ICD-10-CM

## 2017-03-05 DIAGNOSIS — I10 Essential (primary) hypertension: Secondary | ICD-10-CM

## 2017-03-05 NOTE — Patient Instructions (Signed)
Medication Instructions:  Your physician has recommended you make the following change in your medication:   STOP spironolactone  Labwork: Your physician recommends that you return for lab work in: 1 week for BMET   Testing/Procedures: None ordered  Follow-Up: Your physician wants you to follow-up in: 6 months with Dr. Irish Lack. You will receive a reminder letter in the mail two months in advance. If you don't receive a letter, please call our office to schedule the follow-up appointment.   Any Other Special Instructions Will Be Listed Below (If Applicable).     If you need a refill on your cardiac medications before your next appointment, please call your pharmacy.

## 2017-03-06 DIAGNOSIS — R600 Localized edema: Secondary | ICD-10-CM | POA: Diagnosis not present

## 2017-03-06 DIAGNOSIS — Z87440 Personal history of urinary (tract) infections: Secondary | ICD-10-CM | POA: Diagnosis not present

## 2017-03-06 DIAGNOSIS — M1711 Unilateral primary osteoarthritis, right knee: Secondary | ICD-10-CM | POA: Diagnosis not present

## 2017-03-06 DIAGNOSIS — Z8719 Personal history of other diseases of the digestive system: Secondary | ICD-10-CM | POA: Diagnosis not present

## 2017-03-06 DIAGNOSIS — I480 Paroxysmal atrial fibrillation: Secondary | ICD-10-CM | POA: Diagnosis not present

## 2017-03-06 DIAGNOSIS — I2510 Atherosclerotic heart disease of native coronary artery without angina pectoris: Secondary | ICD-10-CM | POA: Diagnosis not present

## 2017-03-10 ENCOUNTER — Other Ambulatory Visit: Payer: Medicare Other

## 2017-03-10 DIAGNOSIS — I10 Essential (primary) hypertension: Secondary | ICD-10-CM

## 2017-03-10 DIAGNOSIS — R600 Localized edema: Secondary | ICD-10-CM

## 2017-03-10 DIAGNOSIS — R0609 Other forms of dyspnea: Secondary | ICD-10-CM

## 2017-03-10 DIAGNOSIS — N184 Chronic kidney disease, stage 4 (severe): Secondary | ICD-10-CM | POA: Diagnosis not present

## 2017-03-10 DIAGNOSIS — I480 Paroxysmal atrial fibrillation: Secondary | ICD-10-CM

## 2017-03-11 ENCOUNTER — Other Ambulatory Visit: Payer: Medicare Other

## 2017-03-11 LAB — BASIC METABOLIC PANEL
BUN/Creatinine Ratio: 14 (ref 12–28)
BUN: 27 mg/dL (ref 8–27)
CALCIUM: 9.7 mg/dL (ref 8.7–10.3)
CO2: 26 mmol/L (ref 20–29)
CREATININE: 1.98 mg/dL — AB (ref 0.57–1.00)
Chloride: 102 mmol/L (ref 96–106)
GFR, EST AFRICAN AMERICAN: 26 mL/min/{1.73_m2} — AB (ref >59)
GFR, EST NON AFRICAN AMERICAN: 23 mL/min/{1.73_m2} — AB (ref >59)
Glucose: 116 mg/dL — ABNORMAL HIGH (ref 65–99)
POTASSIUM: 3.9 mmol/L (ref 3.5–5.2)
Sodium: 146 mmol/L — ABNORMAL HIGH (ref 134–144)

## 2017-03-12 DIAGNOSIS — M1711 Unilateral primary osteoarthritis, right knee: Secondary | ICD-10-CM | POA: Diagnosis not present

## 2017-03-12 DIAGNOSIS — Z8719 Personal history of other diseases of the digestive system: Secondary | ICD-10-CM | POA: Diagnosis not present

## 2017-03-12 DIAGNOSIS — I2510 Atherosclerotic heart disease of native coronary artery without angina pectoris: Secondary | ICD-10-CM | POA: Diagnosis not present

## 2017-03-12 DIAGNOSIS — R600 Localized edema: Secondary | ICD-10-CM | POA: Diagnosis not present

## 2017-03-12 DIAGNOSIS — I480 Paroxysmal atrial fibrillation: Secondary | ICD-10-CM | POA: Diagnosis not present

## 2017-03-12 DIAGNOSIS — Z87440 Personal history of urinary (tract) infections: Secondary | ICD-10-CM | POA: Diagnosis not present

## 2017-03-14 DIAGNOSIS — R600 Localized edema: Secondary | ICD-10-CM | POA: Diagnosis not present

## 2017-03-14 DIAGNOSIS — I480 Paroxysmal atrial fibrillation: Secondary | ICD-10-CM | POA: Diagnosis not present

## 2017-03-14 DIAGNOSIS — Z87440 Personal history of urinary (tract) infections: Secondary | ICD-10-CM | POA: Diagnosis not present

## 2017-03-14 DIAGNOSIS — M1711 Unilateral primary osteoarthritis, right knee: Secondary | ICD-10-CM | POA: Diagnosis not present

## 2017-03-14 DIAGNOSIS — I2510 Atherosclerotic heart disease of native coronary artery without angina pectoris: Secondary | ICD-10-CM | POA: Diagnosis not present

## 2017-03-14 DIAGNOSIS — Z8719 Personal history of other diseases of the digestive system: Secondary | ICD-10-CM | POA: Diagnosis not present

## 2017-03-19 DIAGNOSIS — M1712 Unilateral primary osteoarthritis, left knee: Secondary | ICD-10-CM | POA: Diagnosis not present

## 2017-03-26 DIAGNOSIS — M1712 Unilateral primary osteoarthritis, left knee: Secondary | ICD-10-CM | POA: Diagnosis not present

## 2017-04-02 DIAGNOSIS — M1712 Unilateral primary osteoarthritis, left knee: Secondary | ICD-10-CM | POA: Diagnosis not present

## 2017-04-24 DIAGNOSIS — I480 Paroxysmal atrial fibrillation: Secondary | ICD-10-CM | POA: Diagnosis not present

## 2017-04-24 DIAGNOSIS — E876 Hypokalemia: Secondary | ICD-10-CM | POA: Diagnosis not present

## 2017-04-24 DIAGNOSIS — D869 Sarcoidosis, unspecified: Secondary | ICD-10-CM | POA: Diagnosis not present

## 2017-04-24 DIAGNOSIS — D62 Acute posthemorrhagic anemia: Secondary | ICD-10-CM | POA: Diagnosis not present

## 2017-04-24 DIAGNOSIS — N184 Chronic kidney disease, stage 4 (severe): Secondary | ICD-10-CM | POA: Diagnosis not present

## 2017-04-24 DIAGNOSIS — K922 Gastrointestinal hemorrhage, unspecified: Secondary | ICD-10-CM | POA: Diagnosis not present

## 2017-04-24 DIAGNOSIS — I129 Hypertensive chronic kidney disease with stage 1 through stage 4 chronic kidney disease, or unspecified chronic kidney disease: Secondary | ICD-10-CM | POA: Diagnosis not present

## 2017-04-28 DIAGNOSIS — I480 Paroxysmal atrial fibrillation: Secondary | ICD-10-CM | POA: Diagnosis not present

## 2017-04-28 DIAGNOSIS — J309 Allergic rhinitis, unspecified: Secondary | ICD-10-CM | POA: Diagnosis not present

## 2017-04-28 DIAGNOSIS — N184 Chronic kidney disease, stage 4 (severe): Secondary | ICD-10-CM | POA: Diagnosis not present

## 2017-04-28 DIAGNOSIS — M170 Bilateral primary osteoarthritis of knee: Secondary | ICD-10-CM | POA: Diagnosis not present

## 2017-04-28 DIAGNOSIS — Z6829 Body mass index (BMI) 29.0-29.9, adult: Secondary | ICD-10-CM | POA: Diagnosis not present

## 2017-04-28 DIAGNOSIS — Z7901 Long term (current) use of anticoagulants: Secondary | ICD-10-CM | POA: Diagnosis not present

## 2017-04-28 DIAGNOSIS — I129 Hypertensive chronic kidney disease with stage 1 through stage 4 chronic kidney disease, or unspecified chronic kidney disease: Secondary | ICD-10-CM | POA: Diagnosis not present

## 2017-06-11 ENCOUNTER — Other Ambulatory Visit (HOSPITAL_COMMUNITY): Payer: Self-pay

## 2017-06-12 ENCOUNTER — Ambulatory Visit (HOSPITAL_COMMUNITY)
Admission: RE | Admit: 2017-06-12 | Discharge: 2017-06-12 | Disposition: A | Discharge status: Home / Self Care | Payer: Medicare Other | Source: Ambulatory Visit | Attending: Nephrology | Admitting: Nephrology

## 2017-06-12 DIAGNOSIS — D631 Anemia in chronic kidney disease: Secondary | ICD-10-CM | POA: Insufficient documentation

## 2017-06-12 MED ORDER — FERUMOXYTOL INJECTION 510 MG/17 ML
510.0000 mg | Freq: Once | INTRAVENOUS | Status: AC
  Administered 2017-06-12: 11:00:00 510 mg via INTRAVENOUS
  Filled 2017-06-12: qty 17

## 2017-07-17 ENCOUNTER — Ambulatory Visit (INDEPENDENT_AMBULATORY_CARE_PROVIDER_SITE_OTHER): Payer: Medicare Other | Admitting: Internal Medicine

## 2017-07-17 ENCOUNTER — Telehealth: Payer: Self-pay | Admitting: Interventional Cardiology

## 2017-07-17 VITALS — BP 88/62 | HR 79 | Temp 96.6°F | Ht 66.0 in | Wt 190.0 lb

## 2017-07-17 DIAGNOSIS — I10 Essential (primary) hypertension: Secondary | ICD-10-CM | POA: Diagnosis not present

## 2017-07-17 DIAGNOSIS — R0602 Shortness of breath: Secondary | ICD-10-CM

## 2017-07-17 DIAGNOSIS — I480 Paroxysmal atrial fibrillation: Secondary | ICD-10-CM

## 2017-07-17 MED ORDER — AMIODARONE HCL 200 MG PO TABS: 200.0000 mg | ORAL_TABLET | Freq: Every day | ORAL | 3 refills | Status: DC

## 2017-07-17 NOTE — Telephone Encounter (Signed)
Called patient who states that she has been having a racing HR for the past 3 days. She states that she has been SOB and dizzy. She denies having any chest pain or any other symptoms. Patient states that her BP is 160/108 HR 91. Patient has a history of Afib and some SVT. Patient states that she is taking her Eliquis but has stopped her amiodarone about a month ago. She states that she cannot remember which doctor told her to do so. No APP appointment available today. Arranged for patient to come in for Nurse Visit today for EKG/BP check today at 12:30 PM.

## 2017-07-17 NOTE — Telephone Encounter (Signed)
New message    Patient c/o Palpitations:  High priority if patient c/o lightheadedness, shortness of breath, or chest pain  1) How long have you had palpitations/irregular HR/ Afib? Are you having the symptoms now? RACING HEART FOR 2 DAYS  2) Are you currently experiencing lightheadedness, SOB or CP? SOB, LIGHTHEADED  3) Do you have a history of afib (atrial fibrillation) or irregular heart rhythm? YES  4) Have you checked your BP or HR? (document readings if available): 160/108 HR 91  5) Are you experiencing any other symptoms? DIZZINESS

## 2017-07-17 NOTE — Patient Instructions (Addendum)
Medication Instructions:  Your physician has recommended you make the following change in your medication:   RESTART: amiodarone 200 mg once daily  Labwork: TODAY: CBC, BMET, TSH, Urinalysis   Testing/Procedures: None ordered  Follow-Up: Your physician recommends that you follow up with Dr. Irish Lack on 07/24/17 at 11:40 AM.   Any Other Special Instructions Will Be Listed Below (If Applicable).  We will call your with your results and any further recommendations   If you need a refill on your cardiac medications before your next appointment, please call your pharmacy.

## 2017-07-17 NOTE — Progress Notes (Addendum)
Nurse Visit:  1. Reason for visit: Patient called this morning and stated  that she had been having a racing HR for the past 3 days. She states that she had been SOB and dizzy. She denied having any chest pain or any other symptoms. Patient stated that her BP was 160/108 HR 91. Patient stated that she has been taking her Eliquis but had stopped her amiodarone about a month ago. She stated that she did not remember which doctor told her to do so. There was no APP appointment available today so a Nurse Visit with EKG/BP check was arranged for today at 12:30 PM.   2. H&P: Patient has a history of Afib, some SVT back in August, and HTN  3. Visit: Patient presents to Nurse Visit and states that her heart is not racing like is was this morning and that she does not feel as SOB. EKG performed. Vitals checked and patient was found to be hypotensive at 90/66 by myself. I had another nurse recheck the BP to verify and the repeat BP was 88/62. Reviewed with DOD, Dr. Harrington Challenger who ended up seeing the patient.   4. Plan per MD: Per Dr. Harrington Challenger, DOD, patient is to restart amiodarone 200 mg QD. Labs to be checked today (CBC, BMET, TSH) along with urinalysis.   5. I went ahead and scheduled the patient follow up with Dr. Irish Lack on 07/24/17 at 11:40 AM. Made patient aware that I would call her with her results and any additional recommendations.     ADDENDUM:     PT seen and examined.    Has not been feeling good HR was racing over past several days  Does not feel it now  Denies fevers, chills  Has occasinal diarrhea (this is a chronic problem) Took BP and it was low this AM  Still feels weak   Does not think she'll pass out Breathing is OK   No CP   On exam:  Lungs are CTA   Cardiac exam:  RRR  No S3  Abd:  Benign  Nontender Ext are without edema  Imp:  She may have been in/ out of afib / SVT   Not uncommon to have BP a little low after a prolonged spell  EKG today shows SR  I have asked that she resume  amiodarone  She thinks a renal doctor told her to stop Would recomm labs today to r/o anemia, check WbC, check electrolytes and check thyroid  I encouraged pt to stay hydrated  Increase fluids today      Will f/u with pt on test resuts and inform Lendell Caprice of visit

## 2017-07-18 LAB — CBC WITH DIFFERENTIAL/PLATELET
Basophils Absolute: 0 10*3/uL (ref 0.0–0.2)
Basos: 0 %
EOS (ABSOLUTE): 0.1 10*3/uL (ref 0.0–0.4)
EOS: 1 %
HEMATOCRIT: 47.2 % — AB (ref 34.0–46.6)
Hemoglobin: 15.5 g/dL (ref 11.1–15.9)
Immature Grans (Abs): 0.2 10*3/uL — ABNORMAL HIGH (ref 0.0–0.1)
Immature Granulocytes: 1 %
LYMPHS ABS: 2.5 10*3/uL (ref 0.7–3.1)
Lymphs: 18 %
MCH: 29.8 pg (ref 26.6–33.0)
MCHC: 32.8 g/dL (ref 31.5–35.7)
MCV: 91 fL (ref 79–97)
MONOS ABS: 0.8 10*3/uL (ref 0.1–0.9)
Monocytes: 6 %
NEUTROS ABS: 10.2 10*3/uL — AB (ref 1.4–7.0)
Neutrophils: 74 %
Platelets: 209 10*3/uL (ref 150–379)
RBC: 5.2 x10E6/uL (ref 3.77–5.28)
RDW: 17.5 % — AB (ref 12.3–15.4)
WBC: 13.8 10*3/uL — ABNORMAL HIGH (ref 3.4–10.8)

## 2017-07-18 LAB — BASIC METABOLIC PANEL
BUN/Creatinine Ratio: 15 (ref 12–28)
BUN: 31 mg/dL — ABNORMAL HIGH (ref 8–27)
CO2: 28 mmol/L (ref 20–29)
Calcium: 9.3 mg/dL (ref 8.7–10.3)
Chloride: 100 mmol/L (ref 96–106)
Creatinine, Ser: 2.13 mg/dL — ABNORMAL HIGH (ref 0.57–1.00)
GFR, EST AFRICAN AMERICAN: 24 mL/min/{1.73_m2} — AB (ref >59)
GFR, EST NON AFRICAN AMERICAN: 21 mL/min/{1.73_m2} — AB (ref >59)
Glucose: 101 mg/dL — ABNORMAL HIGH (ref 65–99)
POTASSIUM: 3.8 mmol/L (ref 3.5–5.2)
SODIUM: 149 mmol/L — AB (ref 134–144)

## 2017-07-18 LAB — TSH: TSH: 1.34 u[IU]/mL (ref 0.450–4.500)

## 2017-07-18 LAB — URINALYSIS
BILIRUBIN UA: NEGATIVE
GLUCOSE, UA: NEGATIVE
Ketones, UA: NEGATIVE
Leukocytes, UA: NEGATIVE
Nitrite, UA: NEGATIVE
PROTEIN UA: NEGATIVE
RBC, UA: NEGATIVE
Specific Gravity, UA: 1.01 (ref 1.005–1.030)
Urobilinogen, Ur: 0.2 mg/dL (ref 0.2–1.0)
pH, UA: 6 (ref 5.0–7.5)

## 2017-07-18 NOTE — Addendum Note (Signed)
Addended by: Fay Records on: 07/18/2017 07:22 AM   Modules accepted: Level of Service

## 2017-07-18 NOTE — Telephone Encounter (Signed)
Agree with restarting Amio.  We should check LFTs and TSH in 6 weeks.

## 2017-07-21 NOTE — Telephone Encounter (Addendum)
Will arrange labs at appointment on 3/14

## 2017-07-22 NOTE — Progress Notes (Signed)
Cardiology Office Note   Date:  07/24/2017   ID:  Heather Benson, DOB 1932/01/24, MRN [id number]  PCP:  Heather Stains, MD    No chief complaint on file. AFib   Wt Readings from Last 3 Encounters:  07/24/17 189 lb (85.7 kg)  07/17/17 190 lb (86.2 kg)  03/05/17 189 lb 9.6 oz (86 kg)       History of Present Illness: Heather Benson is a 84 y.o. female  with a hx of paroxysmal atrial fibrillation, HTN, LE edema, GERD. She was seen in February 2016 and was back in atrial fibrillation. She was placed on amiodarone and eventually underwent cardioversion 08/05/14. She was seen in follow-up on 08/23/14. She felt no different and continued to have dizziness as well as intermittent palpitations. Dizziness was worse with standing up. She denied syncope. She was noted to be bradycardic and her bisoprolol was decreased.   In 5/16, she Continued to c/o dizziness and was bradycardic. Her beta-blocker was stopped. FU ECG with improved HR.   Her heart rate improved. However, she remained dizzy. She was seen by outpatient neuro physical therapy as well as neurology. Testing for BPPV was negative. She did have orthostatic hypotension documented at least once.   She has been hospitalized for PNA in 8/17. She was there for 2 day and was quite sick.   Renal dysfunction has been an issue. SHe does not want to do dialysis if it was ever needed.   She was hospitalized for GI bleed, diverticulosis.  She was found to have a UTI. She was sent to [facility], after receiving a transfusion in the hospital.  Lasix has been adjusted with Dr. Lorrene Benson.    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  Her Amio was stopped a month before, and she was hypotensive, and seen by Dr. Harrington Benson in 3/19.  She was in NSR at the time.    She has felt better since starting Amiodarone.  She still has some dizziness and palpitations, but this has improved.   Past Medical History:  Diagnosis Date  . Aortic atherosclerosis  ([place]) 12/25/2016  . Aortic stenosis, mild 12/14/2015  . Arthritis 08-28-11   right knee pain-surgery planned  . Cancer [facility]) 08-28-11   Melonoma-cheek(many Yrs ago) , recent bx. left  face lesion, past skin cancer lesions  . CAP (community acquired pneumonia) 12/13/2015  . CHF (congestive heart failure) ([place])   . Complication of anesthesia 08-28-11   in past arrythmia-  cardiology has seen in past  . Dysrhythmia 08-28-11   only immed.  to several days after surgery- hx. A.fib. in past  . GERD (gastroesophageal reflux disease) 08-28-11   tx. omeprazole  . Hypercholesterolemia   . Hypertension 08-28-11   tx. meds  . IBS (irritable bowel syndrome)   . Lymphedema   . Neuromuscular disorder ([place]) 08-28-11   hx. Polio age 8-slight residual affects legs  . Osteoarthritis    back and left knee  . Osteoporosis   . Pneumonia 12/2015   hospital x 3 days  . PONV (postoperative nausea and vomiting)   . Renal cyst, right   . Sarcoidosis   . Swelling of extremity 08-28-11   bilateral lower extremities- mid calf down    Past Surgical History:  Procedure Laterality Date  . ABDOMINAL HYSTERECTOMY  08-28-11   Partial-vaginal approach  . BREAST REDUCTION SURGERY  1997  . BUNIONECTOMY  08-28-11   right  . CARDIOVERSION N/A 08/05/2014   Procedure: CARDIOVERSION;  Surgeon: Larey Dresser, MD;  Location: [facility];  Service: Cardiovascular;  Laterality: N/A;  . CATARACT EXTRACTION, BILATERAL  08-28-11   bilateral  . COLONOSCOPY WITH PROPOFOL N/A 12/24/2016   Procedure: COLONOSCOPY WITH PROPOFOL;  Surgeon: Wonda Horner, MD;  Location[facility] ENDOSCOPY;  Service: Endoscopy;  Laterality: N/A;  . EYE SURGERY     Cataract  . JOINT REPLACEMENT  Oct. 1, 2012   Right Knee  . KNEE ARTHROSCOPY  09/06/2011   Procedure: ARTHROSCOPY KNEE;  Surgeon: Gearlean Alf, MD;  [facility];  Service: Orthopedics;  Laterality: Right;  with Synovectomy     Current Outpatient Medications  Medication Sig Dispense  Refill  . acetaminophen (TYLENOL) 500 MG tablet Take 1,000 mg by mouth every 8 (eight) hours as needed for moderate pain (pain in legs).    [name] amiodarone (PACERONE) 200 MG tablet Take 1 tablet (200 mg total) by mouth daily. 30 tablet 3  . beclomethasone (QVAR) 80 MCG/ACT inhaler Inhale 2 puffs into the lungs 2 (two) times daily.    [name] ELIQUIS 2.5 MG TABS tablet TAKE 1 TABLET BY MOUTH TWICE A DAY 60 tablet 9  . fexofenadine (ALLEGRA) 180 MG tablet Take 180 mg by mouth daily.    . fluticasone (FLONASE) 50 MCG/ACT nasal spray Place 1 spray into both nostrils daily as needed for allergies.     . furosemide (LASIX) 40 MG tablet Take 20 mg by mouth daily.    [name] guaiFENesin (MUCINEX) 600 MG 12 hr tablet Take 600 mg by mouth 2 (two) times daily.    [name] latanoprost (XALATAN) 0.005 % ophthalmic solution Place 1 drop into both eyes at bedtime.  3  . meclizine (ANTIVERT) 25 MG tablet Take 12.5 mg by mouth 3 (three) times daily as needed for dizziness.   0  . predniSONE (DELTASONE) 10 MG tablet Take 10 mg by mouth daily with breakfast.    . Respiratory Therapy Supplies (FLUTTER) DEVI Use as directed. 1 each 0   No current facility-administered medications for this visit.     Allergies:   Augmentin [amoxicillin-pot clavulanate]; Boniva [ibandronic acid]; Hydromorphone; Latex; Lisinopril; Codeine; and Tape    Social History:  The patient  reports that  has never smoked. she has never used smokeless tobacco. She reports that she does not drink alcohol or use drugs.   Family History:  The patient's family history includes Breast cancer in her sister; Colon cancer in her mother; Emphysema in her father; Heart attack in her brother; Heart disease in her father and mother; Hyperlipidemia in her mother; Hypertension in her father and mother; Stroke in her maternal grandmother.    ROS:  Please see the history of present illness.   Otherwise, review of systems are positive for dizziness.   All other systems are  reviewed and negative.    PHYSICAL EXAM: VS:  BP 134/80   Pulse 66   Ht 5\' 6"  (1.676 m)   Wt 189 lb (85.7 kg)   BMI 30.51 kg/m  , BMI Body mass index is 30.51 kg/m. GEN: Well nourished, well developed, in no acute distress  HEENT: normal  Neck: no JVD, carotid bruits, or masses Cardiac: RRR; no murmurs, rubs, or gallops,no edema  Respiratory:  clear to auscultation bilaterally, normal work of breathing GI: soft, nontender, nondistended, + BS MS: no deformity or atrophy  Skin: warm and dry, no rash Neuro:  Strength and sensation are intact, slow gait Psych: euthymic mood, full affect  EKG:   The ekg ordered today demonstrates NSR , LAD, nonspecific ST changes   Recent Labs: 12/22/2016: ALT 20 12/23/2016: Magnesium 2.1 07/17/2017: BUN 31; Creatinine, Ser 2.13; Hemoglobin 15.5; Platelets 209; Potassium 3.8; Sodium 149; TSH 1.340   Lipid Panel    Component Value Date/Time   CHOL  05/27/2010 0505    168        ATP III CLASSIFICATION:  <200     mg/dL   Desirable  200-239  mg/dL   Borderline High  >=240    mg/dL   High          TRIG 95 05/27/2010 0505   HDL 51 05/27/2010 0505   CHOLHDL 3.3 05/27/2010 0505   VLDL 19 05/27/2010 0505   LDLCALC  05/27/2010 0505    98        Total Cholesterol/HDL:CHD Risk Coronary Heart Disease Risk Table                     Men   Women  1/2 Average Risk   3.4   3.3  Average Risk       5.0   4.4  2 X Average Risk   9.6   7.1  3 X Average Risk  23.4   11.0        Use the calculated Patient Ratio above and the CHD Risk Table to determine the patient's CHD Risk.        ATP III CLASSIFICATION (LDL):  <100     mg/dL   Optimal  100-129  mg/dL   Near or Above                    Optimal  130-159  mg/dL   Borderline  160-189  mg/dL   High  >190     mg/dL   Very High     Other studies Reviewed: Additional studies/ records that were reviewed today with results demonstrating:Notes from Dr. Harrington Benson reviewed .   ASSESSMENT AND  PLAN:  1. AFib: Maintaining NSR. COntinue Amio.  Recheck LFTs, TSH and lipids in 6 weeks.  2. 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  Palpitations better controlled.  3. HTN: The current medical regimen is effective;  continue present plan and medications. 4. CKD stage IV: Avoid nephrotoxins.  Cr 2.1. 5. Aortic atherosclerosis: Check lipids and will try to keep LDL < 100.    Current medicines are reviewed at length with the patient today.  The patient concerns regarding her medicines were addressed.  The following changes have been made:  No change  Labs/ tests ordered today include:   Orders Placed This Encounter  Procedures  . Lipid Profile  . TSH  . Hepatic function panel  . EKG 12-Lead    Recommend 150 minutes/week of aerobic exercise Low fat, low carb, high fiber diet recommended  Disposition:   FU in 6 months   Signed, Larae Grooms, MD  07/24/2017 12:08 PM    [facility] [place], [place], [place]  [address] Phone: [phone]; Fax: [phone]

## 2017-07-23 DIAGNOSIS — K922 Gastrointestinal hemorrhage, unspecified: Secondary | ICD-10-CM | POA: Diagnosis not present

## 2017-07-23 DIAGNOSIS — D869 Sarcoidosis, unspecified: Secondary | ICD-10-CM | POA: Diagnosis not present

## 2017-07-23 DIAGNOSIS — I480 Paroxysmal atrial fibrillation: Secondary | ICD-10-CM | POA: Diagnosis not present

## 2017-07-23 DIAGNOSIS — I129 Hypertensive chronic kidney disease with stage 1 through stage 4 chronic kidney disease, or unspecified chronic kidney disease: Secondary | ICD-10-CM | POA: Diagnosis not present

## 2017-07-23 DIAGNOSIS — N184 Chronic kidney disease, stage 4 (severe): Secondary | ICD-10-CM | POA: Diagnosis not present

## 2017-07-24 ENCOUNTER — Ambulatory Visit (INDEPENDENT_AMBULATORY_CARE_PROVIDER_SITE_OTHER): Payer: Medicare Other | Admitting: Interventional Cardiology

## 2017-07-24 ENCOUNTER — Encounter: Payer: Self-pay | Admitting: Interventional Cardiology

## 2017-07-24 VITALS — BP 134/80 | HR 66 | Ht 66.0 in | Wt 189.0 lb

## 2017-07-24 DIAGNOSIS — I480 Paroxysmal atrial fibrillation: Secondary | ICD-10-CM | POA: Diagnosis not present

## 2017-07-24 DIAGNOSIS — I700 Atherosclerosis of aorta: Secondary | ICD-10-CM | POA: Diagnosis not present

## 2017-07-24 DIAGNOSIS — I10 Essential (primary) hypertension: Secondary | ICD-10-CM | POA: Diagnosis not present

## 2017-07-24 DIAGNOSIS — N184 Chronic kidney disease, stage 4 (severe): Secondary | ICD-10-CM | POA: Diagnosis not present

## 2017-07-24 DIAGNOSIS — I471 Supraventricular tachycardia: Secondary | ICD-10-CM

## 2017-07-24 NOTE — Patient Instructions (Signed)
Medication Instructions:  Your physician recommends that you continue on your current medications as directed. Please refer to the Current Medication list given to you today.   Labwork: Your physician recommends that you return for a FASTING lipid profile, TSH, and Liver function panel in 6 weeks.   Testing/Procedures: None ordered  Follow-Up: Your physician wants you to follow-up in: 6 months with Dr. Irish Lack. You will receive a reminder letter in the mail two months in advance. If you don't receive a letter, please call our office to schedule the follow-up appointment.   Any Other Special Instructions Will Be Listed Below (If Applicable).     If you need a refill on your cardiac medications before your next appointment, please call your pharmacy.

## 2017-07-28 DIAGNOSIS — Z85820 Personal history of malignant melanoma of skin: Secondary | ICD-10-CM | POA: Diagnosis not present

## 2017-07-28 DIAGNOSIS — L570 Actinic keratosis: Secondary | ICD-10-CM | POA: Diagnosis not present

## 2017-07-28 DIAGNOSIS — Z85828 Personal history of other malignant neoplasm of skin: Secondary | ICD-10-CM | POA: Diagnosis not present

## 2017-07-28 DIAGNOSIS — D485 Neoplasm of uncertain behavior of skin: Secondary | ICD-10-CM | POA: Diagnosis not present

## 2017-07-28 DIAGNOSIS — L821 Other seborrheic keratosis: Secondary | ICD-10-CM | POA: Diagnosis not present

## 2017-07-28 DIAGNOSIS — C44622 Squamous cell carcinoma of skin of right upper limb, including shoulder: Secondary | ICD-10-CM | POA: Diagnosis not present

## 2017-08-26 DIAGNOSIS — S22000A Wedge compression fracture of unspecified thoracic vertebra, initial encounter for closed fracture: Secondary | ICD-10-CM | POA: Diagnosis not present

## 2017-08-26 DIAGNOSIS — M545 Low back pain: Secondary | ICD-10-CM | POA: Diagnosis not present

## 2017-08-28 DIAGNOSIS — M546 Pain in thoracic spine: Secondary | ICD-10-CM | POA: Diagnosis not present

## 2017-09-02 ENCOUNTER — Encounter (INDEPENDENT_AMBULATORY_CARE_PROVIDER_SITE_OTHER): Payer: Self-pay

## 2017-09-02 ENCOUNTER — Other Ambulatory Visit: Payer: Medicare Other | Admitting: *Deleted

## 2017-09-02 DIAGNOSIS — I480 Paroxysmal atrial fibrillation: Secondary | ICD-10-CM | POA: Diagnosis not present

## 2017-09-02 DIAGNOSIS — I10 Essential (primary) hypertension: Secondary | ICD-10-CM

## 2017-09-02 DIAGNOSIS — J309 Allergic rhinitis, unspecified: Secondary | ICD-10-CM | POA: Diagnosis not present

## 2017-09-02 DIAGNOSIS — N184 Chronic kidney disease, stage 4 (severe): Secondary | ICD-10-CM

## 2017-09-02 DIAGNOSIS — I471 Supraventricular tachycardia: Secondary | ICD-10-CM

## 2017-09-02 DIAGNOSIS — I4719 Other supraventricular tachycardia: Secondary | ICD-10-CM

## 2017-09-02 DIAGNOSIS — I700 Atherosclerosis of aorta: Secondary | ICD-10-CM | POA: Diagnosis not present

## 2017-09-02 DIAGNOSIS — M545 Low back pain: Secondary | ICD-10-CM | POA: Diagnosis not present

## 2017-09-02 DIAGNOSIS — I129 Hypertensive chronic kidney disease with stage 1 through stage 4 chronic kidney disease, or unspecified chronic kidney disease: Secondary | ICD-10-CM | POA: Diagnosis not present

## 2017-09-02 DIAGNOSIS — R42 Dizziness and giddiness: Secondary | ICD-10-CM | POA: Diagnosis not present

## 2017-09-02 LAB — LIPID PANEL
Chol/HDL Ratio: 3.3 ratio (ref 0.0–4.4)
Cholesterol, Total: 256 mg/dL — ABNORMAL HIGH (ref 100–199)
HDL: 78 mg/dL (ref >39)
LDL Calculated: 155 mg/dL — ABNORMAL HIGH (ref 0–99)
Triglycerides: 115 mg/dL (ref 0–149)
VLDL Cholesterol Cal: 23 mg/dL (ref 5–40)

## 2017-09-02 LAB — HEPATIC FUNCTION PANEL
ALT: 24 IU/L (ref 0–32)
AST: 21 IU/L (ref 0–40)
Albumin: 4 g/dL (ref 3.5–4.7)
Alkaline Phosphatase: 122 IU/L — ABNORMAL HIGH (ref 39–117)
BILIRUBIN TOTAL: 0.6 mg/dL (ref 0.0–1.2)
BILIRUBIN, DIRECT: 0.28 mg/dL (ref 0.00–0.40)
Total Protein: 6.7 g/dL (ref 6.0–8.5)

## 2017-09-02 LAB — TSH: TSH: 1.26 u[IU]/mL (ref 0.450–4.500)

## 2017-09-04 ENCOUNTER — Other Ambulatory Visit: Payer: Medicare Other

## 2017-09-05 DIAGNOSIS — M5136 Other intervertebral disc degeneration, lumbar region: Secondary | ICD-10-CM | POA: Diagnosis not present

## 2017-09-05 DIAGNOSIS — S22000A Wedge compression fracture of unspecified thoracic vertebra, initial encounter for closed fracture: Secondary | ICD-10-CM | POA: Diagnosis not present

## 2017-09-05 DIAGNOSIS — N289 Disorder of kidney and ureter, unspecified: Secondary | ICD-10-CM | POA: Insufficient documentation

## 2017-09-05 DIAGNOSIS — I509 Heart failure, unspecified: Secondary | ICD-10-CM

## 2017-09-05 DIAGNOSIS — M4000 Postural kyphosis, site unspecified: Secondary | ICD-10-CM | POA: Diagnosis not present

## 2017-09-08 ENCOUNTER — Telehealth: Payer: Self-pay

## 2017-09-08 DIAGNOSIS — H401211 Low-tension glaucoma, right eye, mild stage: Secondary | ICD-10-CM | POA: Diagnosis not present

## 2017-09-08 DIAGNOSIS — I700 Atherosclerosis of aorta: Secondary | ICD-10-CM

## 2017-09-08 MED ORDER — ATORVASTATIN CALCIUM 10 MG PO TABS: 10.0000 mg | ORAL_TABLET | Freq: Every day | ORAL | 3 refills | Status: DC

## 2017-09-08 NOTE — Telephone Encounter (Signed)
Called and made patient aware of lab results and recommendations to start atorvastatin 10 mg QD. Made patient aware that we will need to recheck labs in 3 months. Patient verbalized understanding. Rx sent to patient's preferred pharmacy. Lab appointment made for 7/29.

## 2017-09-08 NOTE — Telephone Encounter (Signed)
-----   Message from Jettie Booze, MD sent at 09/08/2017  8:47 AM EDT ----- LDL above target.  Start atorvastatin 10 mg daily.  Check liver and lipids in 3 months.

## 2017-09-12 DIAGNOSIS — M5137 Other intervertebral disc degeneration, lumbosacral region: Secondary | ICD-10-CM | POA: Diagnosis not present

## 2017-09-12 DIAGNOSIS — M5416 Radiculopathy, lumbar region: Secondary | ICD-10-CM | POA: Diagnosis not present

## 2017-09-23 DIAGNOSIS — B029 Zoster without complications: Secondary | ICD-10-CM | POA: Diagnosis not present

## 2017-10-15 ENCOUNTER — Inpatient Hospital Stay (HOSPITAL_COMMUNITY)
Admission: EM | Admit: 2017-10-15 | Discharge: 2017-10-23 | DRG: 515 | Disposition: A | Discharge status: Skilled Nursing Facility | Payer: Medicare Other | Attending: Family Medicine | Admitting: Family Medicine

## 2017-10-15 ENCOUNTER — Emergency Department (HOSPITAL_COMMUNITY): Payer: Medicare Other

## 2017-10-15 ENCOUNTER — Other Ambulatory Visit: Payer: Self-pay

## 2017-10-15 ENCOUNTER — Encounter (HOSPITAL_COMMUNITY): Payer: Self-pay | Admitting: Emergency Medicine

## 2017-10-15 DIAGNOSIS — Z8249 Family history of ischemic heart disease and other diseases of the circulatory system: Secondary | ICD-10-CM | POA: Diagnosis not present

## 2017-10-15 DIAGNOSIS — Z90710 Acquired absence of both cervix and uterus: Secondary | ICD-10-CM

## 2017-10-15 DIAGNOSIS — D631 Anemia in chronic kidney disease: Secondary | ICD-10-CM | POA: Diagnosis present

## 2017-10-15 DIAGNOSIS — S32010A Wedge compression fracture of first lumbar vertebra, initial encounter for closed fracture: Secondary | ICD-10-CM

## 2017-10-15 DIAGNOSIS — L308 Other specified dermatitis: Secondary | ICD-10-CM

## 2017-10-15 DIAGNOSIS — R52 Pain, unspecified: Secondary | ICD-10-CM | POA: Diagnosis not present

## 2017-10-15 DIAGNOSIS — I10 Essential (primary) hypertension: Secondary | ICD-10-CM | POA: Diagnosis not present

## 2017-10-15 DIAGNOSIS — S32011A Stable burst fracture of first lumbar vertebra, initial encounter for closed fracture: Principal | ICD-10-CM | POA: Diagnosis present

## 2017-10-15 DIAGNOSIS — K589 Irritable bowel syndrome without diarrhea: Secondary | ICD-10-CM | POA: Diagnosis present

## 2017-10-15 DIAGNOSIS — N289 Disorder of kidney and ureter, unspecified: Secondary | ICD-10-CM | POA: Diagnosis present

## 2017-10-15 DIAGNOSIS — Z9849 Cataract extraction status, unspecified eye: Secondary | ICD-10-CM

## 2017-10-15 DIAGNOSIS — Z7951 Long term (current) use of inhaled steroids: Secondary | ICD-10-CM

## 2017-10-15 DIAGNOSIS — K5792 Diverticulitis of intestine, part unspecified, without perforation or abscess without bleeding: Secondary | ICD-10-CM | POA: Diagnosis present

## 2017-10-15 DIAGNOSIS — N281 Cyst of kidney, acquired: Secondary | ICD-10-CM | POA: Diagnosis present

## 2017-10-15 DIAGNOSIS — Z881 Allergy status to other antibiotic agents status: Secondary | ICD-10-CM

## 2017-10-15 DIAGNOSIS — N179 Acute kidney failure, unspecified: Secondary | ICD-10-CM | POA: Diagnosis not present

## 2017-10-15 DIAGNOSIS — D869 Sarcoidosis, unspecified: Secondary | ICD-10-CM | POA: Diagnosis present

## 2017-10-15 DIAGNOSIS — S32010D Wedge compression fracture of first lumbar vertebra, subsequent encounter for fracture with routine healing: Secondary | ICD-10-CM | POA: Diagnosis not present

## 2017-10-15 DIAGNOSIS — Z91040 Latex allergy status: Secondary | ICD-10-CM

## 2017-10-15 DIAGNOSIS — Z91048 Other nonmedicinal substance allergy status: Secondary | ICD-10-CM | POA: Diagnosis not present

## 2017-10-15 DIAGNOSIS — M79604 Pain in right leg: Secondary | ICD-10-CM | POA: Diagnosis not present

## 2017-10-15 DIAGNOSIS — Z7901 Long term (current) use of anticoagulants: Secondary | ICD-10-CM | POA: Diagnosis not present

## 2017-10-15 DIAGNOSIS — Z8619 Personal history of other infectious and parasitic diseases: Secondary | ICD-10-CM | POA: Diagnosis not present

## 2017-10-15 DIAGNOSIS — K5733 Diverticulitis of large intestine without perforation or abscess with bleeding: Secondary | ICD-10-CM | POA: Diagnosis not present

## 2017-10-15 DIAGNOSIS — Z888 Allergy status to other drugs, medicaments and biological substances status: Secondary | ICD-10-CM | POA: Diagnosis not present

## 2017-10-15 DIAGNOSIS — K219 Gastro-esophageal reflux disease without esophagitis: Secondary | ICD-10-CM | POA: Diagnosis present

## 2017-10-15 DIAGNOSIS — S22080A Wedge compression fracture of T11-T12 vertebra, initial encounter for closed fracture: Secondary | ICD-10-CM | POA: Diagnosis not present

## 2017-10-15 DIAGNOSIS — Z8612 Personal history of poliomyelitis: Secondary | ICD-10-CM

## 2017-10-15 DIAGNOSIS — I700 Atherosclerosis of aorta: Secondary | ICD-10-CM | POA: Diagnosis present

## 2017-10-15 DIAGNOSIS — R2681 Unsteadiness on feet: Secondary | ICD-10-CM | POA: Diagnosis not present

## 2017-10-15 DIAGNOSIS — I350 Nonrheumatic aortic (valve) stenosis: Secondary | ICD-10-CM | POA: Diagnosis present

## 2017-10-15 DIAGNOSIS — R0902 Hypoxemia: Secondary | ICD-10-CM | POA: Diagnosis not present

## 2017-10-15 DIAGNOSIS — M545 Low back pain, unspecified: Secondary | ICD-10-CM

## 2017-10-15 DIAGNOSIS — B029 Zoster without complications: Secondary | ICD-10-CM

## 2017-10-15 DIAGNOSIS — I130 Hypertensive heart and chronic kidney disease with heart failure and stage 1 through stage 4 chronic kidney disease, or unspecified chronic kidney disease: Secondary | ICD-10-CM | POA: Diagnosis present

## 2017-10-15 DIAGNOSIS — B0229 Other postherpetic nervous system involvement: Secondary | ICD-10-CM | POA: Diagnosis present

## 2017-10-15 DIAGNOSIS — B028 Zoster with other complications: Secondary | ICD-10-CM

## 2017-10-15 DIAGNOSIS — S32041A Stable burst fracture of fourth lumbar vertebra, initial encounter for closed fracture: Secondary | ICD-10-CM | POA: Diagnosis not present

## 2017-10-15 DIAGNOSIS — I509 Heart failure, unspecified: Secondary | ICD-10-CM

## 2017-10-15 DIAGNOSIS — Z66 Do not resuscitate: Secondary | ICD-10-CM | POA: Diagnosis present

## 2017-10-15 DIAGNOSIS — E875 Hyperkalemia: Secondary | ICD-10-CM | POA: Diagnosis present

## 2017-10-15 DIAGNOSIS — E785 Hyperlipidemia, unspecified: Secondary | ICD-10-CM | POA: Diagnosis present

## 2017-10-15 DIAGNOSIS — Z7952 Long term (current) use of systemic steroids: Secondary | ICD-10-CM

## 2017-10-15 DIAGNOSIS — Z7401 Bed confinement status: Secondary | ICD-10-CM | POA: Diagnosis not present

## 2017-10-15 DIAGNOSIS — N184 Chronic kidney disease, stage 4 (severe): Secondary | ICD-10-CM | POA: Diagnosis not present

## 2017-10-15 DIAGNOSIS — M5489 Other dorsalgia: Secondary | ICD-10-CM | POA: Diagnosis not present

## 2017-10-15 DIAGNOSIS — Z96651 Presence of right artificial knee joint: Secondary | ICD-10-CM | POA: Diagnosis present

## 2017-10-15 DIAGNOSIS — M4856XA Collapsed vertebra, not elsewhere classified, lumbar region, initial encounter for fracture: Secondary | ICD-10-CM | POA: Diagnosis not present

## 2017-10-15 DIAGNOSIS — Z91018 Allergy to other foods: Secondary | ICD-10-CM

## 2017-10-15 DIAGNOSIS — E860 Dehydration: Secondary | ICD-10-CM

## 2017-10-15 DIAGNOSIS — R41841 Cognitive communication deficit: Secondary | ICD-10-CM | POA: Diagnosis not present

## 2017-10-15 DIAGNOSIS — M8088XA Other osteoporosis with current pathological fracture, vertebra(e), initial encounter for fracture: Secondary | ICD-10-CM | POA: Diagnosis present

## 2017-10-15 DIAGNOSIS — M2550 Pain in unspecified joint: Secondary | ICD-10-CM | POA: Diagnosis not present

## 2017-10-15 DIAGNOSIS — M8008XA Age-related osteoporosis with current pathological fracture, vertebra(e), initial encounter for fracture: Secondary | ICD-10-CM | POA: Diagnosis not present

## 2017-10-15 DIAGNOSIS — G8929 Other chronic pain: Secondary | ICD-10-CM

## 2017-10-15 DIAGNOSIS — Z85820 Personal history of malignant melanoma of skin: Secondary | ICD-10-CM

## 2017-10-15 DIAGNOSIS — Z79899 Other long term (current) drug therapy: Secondary | ICD-10-CM

## 2017-10-15 DIAGNOSIS — I5031 Acute diastolic (congestive) heart failure: Secondary | ICD-10-CM | POA: Diagnosis present

## 2017-10-15 DIAGNOSIS — R278 Other lack of coordination: Secondary | ICD-10-CM | POA: Diagnosis not present

## 2017-10-15 DIAGNOSIS — K5730 Diverticulosis of large intestine without perforation or abscess without bleeding: Secondary | ICD-10-CM | POA: Diagnosis not present

## 2017-10-15 DIAGNOSIS — I480 Paroxysmal atrial fibrillation: Secondary | ICD-10-CM | POA: Diagnosis not present

## 2017-10-15 DIAGNOSIS — M6281 Muscle weakness (generalized): Secondary | ICD-10-CM | POA: Diagnosis not present

## 2017-10-15 DIAGNOSIS — Z885 Allergy status to narcotic agent status: Secondary | ICD-10-CM

## 2017-10-15 DIAGNOSIS — S32012A Unstable burst fracture of first lumbar vertebra, initial encounter for closed fracture: Secondary | ICD-10-CM | POA: Diagnosis not present

## 2017-10-15 DIAGNOSIS — R1084 Generalized abdominal pain: Secondary | ICD-10-CM | POA: Diagnosis not present

## 2017-10-15 LAB — BASIC METABOLIC PANEL
ANION GAP: 17 — AB (ref 5–15)
BUN: 37 mg/dL — ABNORMAL HIGH (ref 6–20)
CALCIUM: 10 mg/dL (ref 8.9–10.3)
CO2: 24 mmol/L (ref 22–32)
Chloride: 95 mmol/L — ABNORMAL LOW (ref 101–111)
Creatinine, Ser: 2.67 mg/dL — ABNORMAL HIGH (ref 0.44–1.00)
GFR calc Af Amer: 18 mL/min — ABNORMAL LOW (ref >60)
GFR, EST NON AFRICAN AMERICAN: 15 mL/min — AB (ref >60)
Glucose, Bld: 124 mg/dL — ABNORMAL HIGH (ref 65–99)
POTASSIUM: 4.8 mmol/L (ref 3.5–5.1)
Sodium: 136 mmol/L (ref 135–145)

## 2017-10-15 LAB — CBC
HCT: 54.7 % — ABNORMAL HIGH (ref 36.0–46.0)
HEMOGLOBIN: 17.6 g/dL — AB (ref 12.0–15.0)
MCH: 29.9 pg (ref 26.0–34.0)
MCHC: 32.2 g/dL (ref 30.0–36.0)
MCV: 93 fL (ref 78.0–100.0)
Platelets: 223 10*3/uL (ref 150–400)
RBC: 5.88 MIL/uL — AB (ref 3.87–5.11)
RDW: 16.1 % — ABNORMAL HIGH (ref 11.5–15.5)
WBC: 16.2 10*3/uL — ABNORMAL HIGH (ref 4.0–10.5)

## 2017-10-15 LAB — I-STAT TROPONIN, ED: TROPONIN I, POC: 0.03 ng/mL (ref 0.00–0.08)

## 2017-10-15 LAB — BRAIN NATRIURETIC PEPTIDE: B Natriuretic Peptide: 141.8 pg/mL — ABNORMAL HIGH (ref 0.0–100.0)

## 2017-10-15 MED ORDER — METRONIDAZOLE IN NACL 5-0.79 MG/ML-% IV SOLN
500.0000 mg | Freq: Three times a day (TID) | INTRAVENOUS | Status: DC
  Administered 2017-10-16 – 2017-10-17 (×4): 500 mg via INTRAVENOUS
  Filled 2017-10-15 (×4): qty 100

## 2017-10-15 MED ORDER — FLUTICASONE PROPIONATE 50 MCG/ACT NA SUSP: 1.0000 | Freq: Every day | NASAL | Status: DC | PRN

## 2017-10-15 MED ORDER — BECLOMETHASONE DIPROPIONATE 80 MCG/ACT IN AERS: 2.0000 | INHALATION_SPRAY | Freq: Two times a day (BID) | RESPIRATORY_TRACT | Status: DC

## 2017-10-15 MED ORDER — ONDANSETRON HCL 4 MG/2ML IJ SOLN: 4.0000 mg | Freq: Four times a day (QID) | INTRAMUSCULAR | Status: DC | PRN

## 2017-10-15 MED ORDER — PREDNISONE 10 MG PO TABS
10.0000 mg | ORAL_TABLET | Freq: Every day | ORAL | Status: DC
  Administered 2017-10-16 – 2017-10-23 (×8): 10 mg via ORAL
  Filled 2017-10-15 (×8): qty 1

## 2017-10-15 MED ORDER — HYDROMORPHONE HCL 2 MG/ML IJ SOLN
0.5000 mg | Freq: Once | INTRAMUSCULAR | Status: AC
  Administered 2017-10-15: 0.5 mg via INTRAVENOUS
  Filled 2017-10-15: qty 1

## 2017-10-15 MED ORDER — LATANOPROST 0.005 % OP SOLN
1.0000 [drp] | Freq: Every day | OPHTHALMIC | Status: DC
  Administered 2017-10-15 – 2017-10-22 (×8): 1 [drp] via OPHTHALMIC
  Filled 2017-10-15: qty 2.5

## 2017-10-15 MED ORDER — METRONIDAZOLE IN NACL 5-0.79 MG/ML-% IV SOLN
500.0000 mg | Freq: Once | INTRAVENOUS | Status: AC
  Administered 2017-10-15: 500 mg via INTRAVENOUS
  Filled 2017-10-15: qty 100

## 2017-10-15 MED ORDER — SODIUM CHLORIDE 0.9 % IV BOLUS
500.0000 mL | Freq: Once | INTRAVENOUS | Status: AC
  Administered 2017-10-15: 500 mL via INTRAVENOUS

## 2017-10-15 MED ORDER — ACETAMINOPHEN 500 MG PO TABS
1000.0000 mg | ORAL_TABLET | Freq: Four times a day (QID) | ORAL | Status: DC
  Administered 2017-10-15 – 2017-10-17 (×6): 1000 mg via ORAL
  Filled 2017-10-15 (×11): qty 2

## 2017-10-15 MED ORDER — GABAPENTIN 100 MG PO CAPS
100.0000 mg | ORAL_CAPSULE | Freq: Every day | ORAL | Status: DC
  Administered 2017-10-15 – 2017-10-22 (×8): 100 mg via ORAL
  Filled 2017-10-15 (×8): qty 1

## 2017-10-15 MED ORDER — CIPROFLOXACIN IN D5W 400 MG/200ML IV SOLN
400.0000 mg | Freq: Once | INTRAVENOUS | Status: AC
  Administered 2017-10-15: 400 mg via INTRAVENOUS
  Filled 2017-10-15: qty 200

## 2017-10-15 MED ORDER — HYDRALAZINE HCL 20 MG/ML IJ SOLN: 5.0000 mg | Freq: Three times a day (TID) | INTRAMUSCULAR | Status: DC | PRN

## 2017-10-15 MED ORDER — ACETAMINOPHEN 325 MG PO TABS: 650.0000 mg | ORAL_TABLET | Freq: Four times a day (QID) | ORAL | Status: DC | PRN

## 2017-10-15 MED ORDER — PANTOPRAZOLE SODIUM 40 MG PO TBEC
40.0000 mg | DELAYED_RELEASE_TABLET | Freq: Every day | ORAL | Status: DC
  Administered 2017-10-16 – 2017-10-23 (×7): 40 mg via ORAL
  Filled 2017-10-15 (×8): qty 1

## 2017-10-15 MED ORDER — AMIODARONE HCL 200 MG PO TABS
200.0000 mg | ORAL_TABLET | Freq: Every day | ORAL | Status: DC
  Administered 2017-10-15 – 2017-10-23 (×9): 200 mg via ORAL
  Filled 2017-10-15 (×9): qty 1

## 2017-10-15 MED ORDER — FUROSEMIDE 20 MG PO TABS: 20.0000 mg | ORAL_TABLET | Freq: Every day | ORAL | Status: DC

## 2017-10-15 MED ORDER — FUROSEMIDE 20 MG PO TABS
20.0000 mg | ORAL_TABLET | Freq: Every day | ORAL | Status: DC
  Administered 2017-10-16 – 2017-10-23 (×8): 20 mg via ORAL
  Filled 2017-10-15 (×8): qty 1

## 2017-10-15 MED ORDER — GABAPENTIN 300 MG PO CAPS: 300.0000 mg | ORAL_CAPSULE | Freq: Every day | ORAL | Status: DC

## 2017-10-15 MED ORDER — BUDESONIDE 0.25 MG/2ML IN SUSP
0.2500 mg | Freq: Two times a day (BID) | RESPIRATORY_TRACT | Status: DC
Start: 2017-10-15 — End: 2017-10-19
  Administered 2017-10-16 – 2017-10-19 (×8): 0.25 mg via RESPIRATORY_TRACT
  Filled 2017-10-15 (×9): qty 2

## 2017-10-15 MED ORDER — HYDROMORPHONE HCL 1 MG/ML IJ SOLN
0.5000 mg | INTRAMUSCULAR | Status: DC | PRN
  Administered 2017-10-15 – 2017-10-22 (×17): 0.5 mg via INTRAVENOUS
  Filled 2017-10-15 (×17): qty 0.5

## 2017-10-15 MED ORDER — SENNOSIDES-DOCUSATE SODIUM 8.6-50 MG PO TABS: 1.0000 | ORAL_TABLET | Freq: Every evening | ORAL | Status: DC | PRN

## 2017-10-15 MED ORDER — ONDANSETRON HCL 4 MG PO TABS
4.0000 mg | ORAL_TABLET | Freq: Four times a day (QID) | ORAL | Status: DC | PRN
  Filled 2017-10-15: qty 1

## 2017-10-15 MED ORDER — SODIUM CHLORIDE 0.9 % IV SOLN
INTRAVENOUS | Status: DC
  Administered 2017-10-15: 17:00:00 via INTRAVENOUS

## 2017-10-15 MED ORDER — ACETAMINOPHEN 650 MG RE SUPP: 650.0000 mg | Freq: Four times a day (QID) | RECTAL | Status: DC | PRN

## 2017-10-15 MED ORDER — MECLIZINE HCL 25 MG PO TABS: 12.5000 mg | ORAL_TABLET | Freq: Three times a day (TID) | ORAL | Status: DC | PRN

## 2017-10-15 MED ORDER — CIPROFLOXACIN IN D5W 400 MG/200ML IV SOLN: 400.0000 mg | INTRAVENOUS | Status: DC

## 2017-10-15 MED ORDER — HYDROCODONE-ACETAMINOPHEN 5-325 MG PO TABS: 1.0000 | ORAL_TABLET | ORAL | Status: DC | PRN

## 2017-10-15 MED ORDER — APIXABAN 2.5 MG PO TABS
2.5000 mg | ORAL_TABLET | Freq: Two times a day (BID) | ORAL | Status: DC
  Administered 2017-10-15 – 2017-10-16 (×2): 2.5 mg via ORAL
  Filled 2017-10-15 (×2): qty 1

## 2017-10-15 MED ORDER — HYDROMORPHONE HCL 2 MG/ML IJ SOLN: 0.5000 mg | Freq: Once | INTRAMUSCULAR | Status: DC

## 2017-10-15 MED ORDER — ATORVASTATIN CALCIUM 10 MG PO TABS
10.0000 mg | ORAL_TABLET | Freq: Every day | ORAL | Status: DC
  Administered 2017-10-16 – 2017-10-23 (×8): 10 mg via ORAL
  Filled 2017-10-15 (×8): qty 1

## 2017-10-15 MED ORDER — BISACODYL 10 MG RE SUPP: 10.0000 mg | Freq: Every day | RECTAL | Status: DC | PRN

## 2017-10-15 MED ORDER — HYDROMORPHONE HCL 2 MG/ML IJ SOLN: 0.5000 mg | INTRAMUSCULAR | Status: DC | PRN

## 2017-10-15 MED ORDER — OXYCODONE HCL 5 MG PO TABS
2.5000 mg | ORAL_TABLET | Freq: Four times a day (QID) | ORAL | Status: DC | PRN
  Administered 2017-10-15: 2.5 mg via ORAL
  Filled 2017-10-15: qty 1

## 2017-10-15 NOTE — ED Notes (Signed)
While being moved to waiting room husband became upset that patient has not eaten since yesterday, is complaining of pain, and is being moved to the lobby without seeing a physician. Re-explained triage process and that patient will be seen as soon as possible. Patient's husband angry stating "We don't need to be in the lobby, Why do you think I called the ambulance?"

## 2017-10-15 NOTE — ED Provider Notes (Signed)
[facility] EMERGENCY DEPARTMENT Provider Note   CSN: [id number] Arrival date & time: 10/15/17  1025     History   Chief Complaint Chief Complaint  Patient presents with  . Back Pain    HPI Heather Benson is a 84 y.o. female.  Patient with history of degenerative bone disease, high blood pressure, reflux, heart failure, pneumonia presents with worsening lower back pain midline and left sided.  Worse with movement palpation.  Patient had this pain approximately 4 weeks ago and had an MRI lumbar spine which showed degeneration per family report, no history of back surgeries.  No fevers.  Patient had injection to help with the pain.  After the MRI patient developed zoster rash in the left lower back.  Patient's had pain from both since then however is been worsening the past few days and she has had decreased appetite and nausea because of it.  Patient's been taking Norco however it has not helped.  No weakness or numbness in the legs.  No new injuries.     Past Medical History:  Diagnosis Date  . Aortic atherosclerosis ([place]) 12/25/2016  . Aortic stenosis, mild 12/14/2015  . Arthritis 08-28-11   right knee pain-surgery planned  . Cancer [facility]) 08-28-11   Melonoma-cheek(many Yrs ago) , recent bx. left  face lesion, past skin cancer lesions  . CAP (community acquired pneumonia) 12/13/2015  . CHF (congestive heart failure) ([place])   . Complication of anesthesia 08-28-11   in past arrythmia-  cardiology has seen in past  . Dysrhythmia 08-28-11   only immed.  to several days after surgery- hx. A.fib. in past  . GERD (gastroesophageal reflux disease) 08-28-11   tx. omeprazole  . Hypercholesterolemia   . Hypertension 08-28-11   tx. meds  . IBS (irritable bowel syndrome)   . Lymphedema   . Neuromuscular disorder ([place]) 08-28-11   hx. Polio age 35-slight residual affects legs  . Osteoarthritis    back and left knee  . Osteoporosis   . Pneumonia 12/2015   hospital x 3 days   . PONV (postoperative nausea and vomiting)   . Renal cyst, right   . Sarcoidosis   . Swelling of extremity 08-28-11   bilateral lower extremities- mid calf down    Patient Active Problem List   Diagnosis Date Noted  . Diverticulitis of colon with hemorrhage 12/25/2016  . Acute lower UTI 12/25/2016  . Aortic atherosclerosis ([place]) 12/25/2016  . Acute diastolic CHF (congestive heart failure) ([place])   . CAP (community acquired pneumonia) 12/13/2015  . Elevated troponin 12/13/2015  . Anemia in chronic renal disease 12/13/2015  . Hyponatremia 12/13/2015  . DOE (dyspnea on exertion) 03/06/2015  . Bradycardia 08/23/2014  . Essential hypertension 08/23/2014  . Lightheadedness 08/23/2014  . Fatigue 06/22/2014  . PAF (paroxysmal atrial fibrillation) ([place]) 01/27/2014  . CKD (chronic kidney disease), stage IV ([place]) 01/27/2014  . Swelling of limb 04/01/2012  . Edema leg 04/01/2012    Past Surgical History:  Procedure Laterality Date  . ABDOMINAL HYSTERECTOMY  08-28-11   Partial-vaginal approach  . BREAST REDUCTION SURGERY  1997  . BUNIONECTOMY  08-28-11   right  . CARDIOVERSION N/A 08/05/2014   Procedure: CARDIOVERSION;  Surgeon: Larey Dresser, MD;  Location: [facility];  Service: Cardiovascular;  Laterality: N/A;  . CATARACT EXTRACTION, BILATERAL  08-28-11   bilateral  . COLONOSCOPY WITH PROPOFOL N/A 12/24/2016   Procedure: COLONOSCOPY WITH PROPOFOL;  Surgeon: Wonda Horner, MD;  Location: [facility]  ENDOSCOPY;  Service: Endoscopy;  Laterality: N/A;  . EYE SURGERY     Cataract  . JOINT REPLACEMENT  Oct. 1, 2012   Right Knee  . KNEE ARTHROSCOPY  09/06/2011   Procedure: ARTHROSCOPY KNEE;  Surgeon: Gearlean Alf, MD;  Location: [facility] ORS;  Service: Orthopedics;  Laterality: Right;  with Synovectomy     OB History   None      Home Medications    Prior to Admission medications   Medication Sig Start Date End Date Taking? Authorizing Provider  acetaminophen (TYLENOL) 500 MG tablet Take  1,000 mg by mouth every 8 (eight) hours as needed for moderate pain (pain in legs).    [provider]  amiodarone (PACERONE) 200 MG tablet Take 1 tablet (200 mg total) by mouth daily. 07/17/17   Jettie Booze, MD  atorvastatin (LIPITOR) 10 MG tablet Take 1 tablet (10 mg total) by mouth daily. 09/08/17   Jettie Booze, MD  beclomethasone (QVAR) 80 MCG/ACT inhaler Inhale 2 puffs into the lungs 2 (two) times daily.    [provider]  ELIQUIS 2.5 MG TABS tablet TAKE 1 TABLET BY MOUTH TWICE A DAY 11/26/16   Jettie Booze, MD  fexofenadine (ALLEGRA) 180 MG tablet Take 180 mg by mouth daily.    [provider]  fluticasone (FLONASE) 50 MCG/ACT nasal spray Place 1 spray into both nostrils daily as needed for allergies.  09/27/13   [provider]  furosemide (LASIX) 40 MG tablet Take 20 mg by mouth daily. 12/09/15   [provider]  guaiFENesin (MUCINEX) 600 MG 12 hr tablet Take 600 mg by mouth 2 (two) times daily.    [provider]  latanoprost (XALATAN) 0.005 % ophthalmic solution Place 1 drop into both eyes at bedtime. 08/11/14   [provider]  meclizine (ANTIVERT) 25 MG tablet Take 12.5 mg by mouth 3 (three) times daily as needed for dizziness.  04/01/14   [provider]  predniSONE (DELTASONE) 10 MG tablet Take 10 mg by mouth daily with breakfast.    [provider]  Respiratory Therapy Supplies (FLUTTER) DEVI Use as directed. 02/06/16   Marshell Garfinkel, MD    Family History Family History  Problem Relation Age of Onset  . Heart disease Mother        Varicose Veins  . Hyperlipidemia Mother   . Hypertension Mother   . Colon cancer Mother   . Heart disease Father        Heart Disease before age 85  . Hypertension Father   . Emphysema Father   . Breast cancer Sister   . Stroke Maternal Grandmother   . Heart attack Brother     Social History Social History   Tobacco Use  . Smoking status:  Never Smoker  . Smokeless tobacco: Never Used  Substance Use Topics  . Alcohol use: No    Alcohol/week: 0.0 oz  . Drug use: No     Allergies   Augmentin [amoxicillin-pot clavulanate]; Boniva [ibandronic acid]; Hydromorphone; Latex; Lisinopril; Codeine; and Tape   Review of Systems Review of Systems  Constitutional: Positive for appetite change. Negative for chills and fever.  HENT: Negative for congestion.   Eyes: Negative for visual disturbance.  Respiratory: Negative for shortness of breath.   Cardiovascular: Negative for chest pain.  Gastrointestinal: Positive for nausea. Negative for abdominal pain and vomiting.  Genitourinary: Negative for dysuria and flank pain.  Musculoskeletal: Positive for back pain. Negative for neck pain  and neck stiffness.  Skin: Negative for rash.  Neurological: Negative for weakness, light-headedness, numbness and headaches.     Physical Exam Updated Vital Signs BP 134/75   Pulse 81   Temp 98.4 F (36.9 C) (Oral)   Resp 18   SpO2 (!) 89%   Physical Exam  Constitutional: She is oriented to person, place, and time. She appears well-developed and well-nourished.  HENT:  Head: Normocephalic and atraumatic.  Eyes: Conjunctivae are normal. Right eye exhibits no discharge. Left eye exhibits no discharge.  Neck: Normal range of motion. Neck supple. No tracheal deviation present.  Cardiovascular: Normal rate and regular rhythm.  Pulmonary/Chest: Effort normal and breath sounds normal.  Abdominal: Soft. She exhibits no distension. There is no tenderness. There is no guarding.  Musculoskeletal: She exhibits no edema.  Neurological: She is alert and oriented to person, place, and time.  Skin: Skin is warm. No rash noted.  Psychiatric: She has a normal mood and affect.  Nursing note and vitals reviewed.    ED Treatments / Results  Labs (all labs ordered are listed, but only abnormal results are displayed) Labs Reviewed  BASIC METABOLIC PANEL  - Abnormal; Notable for the following components:      Result Value   Chloride 95 (*)    Glucose, Bld 124 (*)    BUN 37 (*)    Creatinine, Ser 2.67 (*)    GFR calc non Af Amer 15 (*)    GFR calc Af Amer 18 (*)    Anion gap 17 (*)    All other components within normal limits  CBC - Abnormal; Notable for the following components:   WBC 16.2 (*)    RBC 5.88 (*)    Hemoglobin 17.6 (*)    HCT 54.7 (*)    RDW 16.1 (*)    All other components within normal limits  URINALYSIS, ROUTINE W REFLEX MICROSCOPIC  BRAIN NATRIURETIC PEPTIDE  I-STAT TROPONIN, ED    EKG None  Radiology Dg Chest 2 View  Result Date: 10/15/2017 CLINICAL DATA:  Hypoxia. EXAM: CHEST - 2 VIEW COMPARISON:  12/26/2015 FINDINGS: Cardiomediastinal silhouette is normal. Mediastinal contours appear intact. Calcific atherosclerotic disease and tortuosity of the aorta. There is no evidence of focal airspace consolidation, pleural effusion or pneumothorax. Osseous structures are without acute abnormality. Soft tissues are grossly normal. IMPRESSION: No active cardiopulmonary disease. Calcific atherosclerotic disease and tortuosity of the aorta. Electronically Signed   By: Fidela Salisbury M.D.   On: 10/15/2017 14:36   Ct Renal Stone Study  Result Date: 10/15/2017 CLINICAL DATA:  Flank pain, stone disease suspected. Shingles. Symptoms for 1 week. Symptoms are worse today. EXAM: CT ABDOMEN AND PELVIS WITHOUT CONTRAST TECHNIQUE: Multidetector CT imaging of the abdomen and pelvis was performed following the standard protocol without IV contrast. COMPARISON:  CT of the abdomen and pelvis 12/24/2016. FINDINGS: Lower chest: Mild dependent atelectasis is present at both lung bases, right greater than left. No significant consolidation is present. There is no nodule or mass lesion. Coronary artery calcifications are present. Extensive mitral annular calcifications are present. No significant pleural or pericardial effusion is present.  Hepatobiliary: No focal liver abnormality is seen. No gallstones, gallbladder wall thickening, or biliary dilatation. Pancreas: Unremarkable. No pancreatic ductal dilatation or surrounding inflammatory changes. Spleen: The spleen is within normal limits. Adrenals/Urinary Tract: Adrenals are within normal limits. Multiple exophytic lesions are again seen bilaterally. Right-sided lesions measure water density. 2 upper pole lesions posteriorly on the left measure slightly higher  density. The larger measures 2.1 cm, decreased since the prior exam. Ureters are within normal limits. The urinary bladder is unremarkable. No stones are present. Stomach/Bowel: The stomach and duodenum are within normal limits for the small bowel is unremarkable. Terminal ileum is within normal limits. The appendix is visualized and normal. Ascending colon is normal transverse colon and descending colon are within normal limits. Diverticular changes are present within the sigmoid colon. There is wall thickening with minimal stranding. No fluid collection or free air is present. The rectum is within normal limits. Vascular/Lymphatic: Atherosclerotic calcifications are present in the aorta and branch vessels without aneurysm. No significant adenopathy is present. Reproductive: Status post hysterectomy. No adnexal masses. Other: No abdominal wall hernia or abnormality. No abdominopelvic ascites. Musculoskeletal: L1 comminuted vertebral body fracture is present. There is sparing of the posterior elements. Fracture involves predominantly the inferior endplate. A remote superior endplate fracture at N27 is stable. Acute/subacute superior endplate fractures present at L4. Bone fragments are retropulsed 5 mm into the canal. Vertebral body heights at L2, L3, and L5 are preserved. IMPRESSION: 1. Diverticular disease with wall thickening and slight stranding in the sigmoid colon discerning for developing acute diverticulitis. 2. Acute/subacute vertebral  body fractures at L1 and L4. Retropulsed bone at L4 extends 5 mm into the canal. 3.  Aortic Atherosclerosis (ICD10-I70.0).  No aneurysm is present. 4. Multiple bilateral exophytic lesions of both kidneys. Majority of these lesions are cystic. Some increase in size. These are incompletely evaluated without IV contrast. Given the interval growth, MRI of the abdomen without and with contrast is recommended. Electronically Signed   By: San Morelle M.D.   On: 10/15/2017 15:01    Procedures Procedures (including critical care time)  Medications Ordered in ED Medications  ciprofloxacin (CIPRO) IVPB 400 mg (has no administration in time range)  metroNIDAZOLE (FLAGYL) IVPB 500 mg (has no administration in time range)  sodium chloride 0.9 % bolus 500 mL (500 mLs Intravenous New Bag/Given 10/15/17 1412)  HYDROmorphone (DILAUDID) injection 0.5 mg (0.5 mg Intravenous Given 10/15/17 1412)     Initial Impression / Assessment and Plan / ED Course  I have reviewed the triage vital signs and the nursing notes.  Pertinent labs & imaging results that were available during my care of the patient were reviewed by me and considered in my medical decision making (see chart for details).    Patient presents with worsening nausea and left flank pain.  Patient has degenerative lumbar disease and zoster which likely are contributing.  With symptoms worsening despite management plan for CT scan to look for other pathology.  CT scan showed acute diverticulitis as well.  Unfortunately she has 3 different reasons for worsening pain and nausea.  Patient is worsening kidney function is clinically dehydrated.  IV fluids and pain meds ordered.  Plan for admission for pain control, IV antibiotics, fluids and reassessment. Oxygen saturation around 90% in the room, patient denies cough or shortness of breath, chest x-ray reviewed unremarkable. Possibly post narcotic related.  Patient denies blood clot risk factors.  Plan for  monitoring and nasal cannula.  The patients results and plan were reviewed and discussed.   Any x-rays performed were independently reviewed by myself.   Differential diagnosis were considered with the presenting HPI.  Medications  ciprofloxacin (CIPRO) IVPB 400 mg (has no administration in time range)  metroNIDAZOLE (FLAGYL) IVPB 500 mg (has no administration in time range)  sodium chloride 0.9 % bolus 500 mL (500 mLs Intravenous New  Bag/Given 10/15/17 1412)  HYDROmorphone (DILAUDID) injection 0.5 mg (0.5 mg Intravenous Given 10/15/17 1412)    Vitals:   10/15/17 1035 10/15/17 1304 10/15/17 1315 10/15/17 1330  BP: 127/80 (!) 161/85 139/70 134/75  Pulse: 88 82 81 81  Resp: 18     Temp: 98.4 F (36.9 C)     TempSrc: Oral     SpO2: 98% (!) 86% (!) 86% (!) 89%    Final diagnoses:  Herpes zoster without complication  Lumbar pain  Dehydration  Acute renal failure, unspecified acute renal failure type ([place])  Acute diverticulitis    Admission/ observation were discussed with the admitting physician, patient and/or family and they are comfortable with the plan.    Final Clinical Impressions(s) / ED Diagnoses   Final diagnoses:  Herpes zoster without complication  Lumbar pain  Dehydration  Acute renal failure, unspecified acute renal failure type [facility])  Acute diverticulitis    ED Discharge Orders    None       Elnora [place], MD 10/15/17 1600

## 2017-10-15 NOTE — H&P (Signed)
History and Physical    RAFAELLA KOLE BMW:[id number] DOB: 12/22/1931 DOA: 10/15/2017   PCP: [name], MD   Patient coming from:  Home    Chief Complaint: Back pain, left lower quadrant pain, healing herpes zoster   HPI: Heather Benson is a 84 y.o. female with medical history significant for DJD, hypertension, GERD, CKD stage III, recent shingles, brought to the emergency department with worsening lower back pain and left-sided abdominal pain.  In review, 4 weeks ago, the patient presented to her orthopedic physicians, with back pain, which prior lumbar spine MRI showed degeneration, and stenosis, requiring steroid injection.  Initially, this pain was improving, however, her status was complicated with shingles on the left flank and left back, for which she was not treated with any antivirals, and is now beginning to resolve.  For that, she was taking Norco which somewhat was helping her but not resolving the pain.  She denies any unilateral weakness, leg numbness, urine or bowel incontinence or retention.  She denies any new injuries or falls.  Her appetite has been significantly decreased due to the pain.  She denies any vomiting.  She feels very dehydrated.  She has significant left lower quadrant pain as well, without frank diarrhea.  She does have a history of diverticulosis notes during her last admission about 1 year ago.  She presented to the emergency department for further evaluation and management of her symptoms.   ED Course:  BP 134/75   Pulse 81   Temp 98.4 F (36.9 C) (Oral)   Resp 18   SpO2 (!) 89%   White count 16.2, (has been recently on prednisone as well)  hemoglobin 17.6, creatinine 2.67. Troponin negative Chest x-ray NAD CT renal renal stone studies shows diverticular disease, with slight stranding in the sigmoid colon, possible developing acute diverticulitis.  In addition, acute-subacute vertebral body fractures and L1-L4, mild bone retropulsion at L4,  multiple bilateral exophytic lesions in both kidneys, likely cystic. Patient received Cipro and Flagyl for diverticulitis  Is also receiving Dilaudid 0.5 mg IV every 4 to 6 prn for backpain, LLQ pain and Flank pain    Review of Systems:  As per HPI otherwise all other systems reviewed and are negative  Past Medical History:  Diagnosis Date  . Aortic atherosclerosis ([place]) 12/25/2016  . Aortic stenosis, mild 12/14/2015  . Arthritis 08-28-11   right knee pain-surgery planned  . Cancer [facility]) 08-28-11   Melonoma-cheek(many Yrs ago) , recent bx. left  face lesion, past skin cancer lesions  . CAP (community acquired pneumonia) 12/13/2015  . CHF (congestive heart failure) ([place])   . Complication of anesthesia 08-28-11   in past arrythmia-  cardiology has seen in past  . Dysrhythmia 08-28-11   only immed.  to several days after surgery- hx. A.fib. in past  . GERD (gastroesophageal reflux disease) 08-28-11   tx. omeprazole  . Hypercholesterolemia   . Hypertension 08-28-11   tx. meds  . IBS (irritable bowel syndrome)   . Lymphedema   . Neuromuscular disorder ([place]) 08-28-11   hx. Polio age 39-slight residual affects legs  . Osteoarthritis    back and left knee  . Osteoporosis   . Pneumonia 12/2015   hospital x 3 days  . PONV (postoperative nausea and vomiting)   . Renal cyst, right   . Sarcoidosis   . Swelling of extremity 08-28-11   bilateral lower extremities- mid calf down    Past Surgical History:  Procedure Laterality Date  .  ABDOMINAL HYSTERECTOMY  08-28-11   Partial-vaginal approach  . BREAST REDUCTION SURGERY  1997  . BUNIONECTOMY  08-28-11   right  . CARDIOVERSION N/A 08/05/2014   Procedure: CARDIOVERSION;  Surgeon: Larey Dresser, MD;  Location: [place];  Service: Cardiovascular;  Laterality: N/A;  . CATARACT EXTRACTION, BILATERAL  08-28-11   bilateral  . COLONOSCOPY WITH PROPOFOL N/A 12/24/2016   Procedure: COLONOSCOPY WITH PROPOFOL;  Surgeon: Wonda Horner, MD;  Location:  [facility] ENDOSCOPY;  Service: Endoscopy;  Laterality: N/A;  . EYE SURGERY     Cataract  . JOINT REPLACEMENT  Oct. 1, 2012   Right Knee  . KNEE ARTHROSCOPY  09/06/2011   Procedure: ARTHROSCOPY KNEE;  Surgeon: Gearlean Alf, MD;  Location: [facility] ORS;  Service: Orthopedics;  Laterality: Right;  with Synovectomy    Social History Social History   Socioeconomic History  . Marital status: Married    Spouse name: [name]  . Number of children: 3  . Years of education: 86  . Highest education level: Not on file  Occupational History  . Occupation: Retired- [employer]  . Financial resource strain: Not on file  . Food insecurity:    Worry: Not on file    Inability: Not on file  . Transportation needs:    Medical: Not on file    Non-medical: Not on file  Tobacco Use  . Smoking status: Never Smoker  . Smokeless tobacco: Never Used  Substance and Sexual Activity  . Alcohol use: No    Alcohol/week: 0.0 oz  . Drug use: No  . Sexual activity: Not Currently    Partners: Male  Lifestyle  . Physical activity:    Days per week: Not on file    Minutes per session: Not on file  . Stress: Not on file  Relationships  . Social connections:    Talks on phone: Not on file    Gets together: Not on file    Attends religious service: Not on file    Active member of club or organization: Not on file    Attends meetings of clubs or organizations: Not on file    Relationship status: Not on file  . Intimate partner violence:    Fear of current or ex partner: Not on file    Emotionally abused: Not on file    Physically abused: Not on file    Forced sexual activity: Not on file  Other Topics Concern  . Not on file  Social History Narrative   Lives with husband, Hilbert Odor at home   Caffeine use: Drinks coffee/tea/caffeine free soda   OCCUPATION: retired      Allergies  Allergen Reactions  . Augmentin [Amoxicillin-Pot Clavulanate] Diarrhea and Nausea Only  . Boniva [Ibandronic Acid]  Diarrhea  . Codeine Other (See Comments)    "Spaced out feeling" and Lightheadedness  . Hydromorphone Nausea And Vomiting  . Latex Other (See Comments)    Unknown  . Lisinopril Cough  . Tape Itching and Rash    Family History  Problem Relation Age of Onset  . Heart disease Mother        Varicose Veins  . Hyperlipidemia Mother   . Hypertension Mother   . Colon cancer Mother   . Heart disease Father        Heart Disease before age 57  . Hypertension Father   . Emphysema Father   . Breast cancer Sister   . Stroke Maternal Grandmother   .  Heart attack Brother       Prior to Admission medications   Medication Sig Start Date End Date Taking? Authorizing Provider  acetaminophen (TYLENOL) 500 MG tablet Take 1,000 mg by mouth every 8 (eight) hours as needed for moderate pain (pain in legs).    [provider]  amiodarone (PACERONE) 200 MG tablet Take 1 tablet (200 mg total) by mouth daily. 07/17/17   Jettie Booze, MD  atorvastatin (LIPITOR) 10 MG tablet Take 1 tablet (10 mg total) by mouth daily. 09/08/17   Jettie Booze, MD  beclomethasone (QVAR) 80 MCG/ACT inhaler Inhale 2 puffs into the lungs 2 (two) times daily.    [provider]  ELIQUIS 2.5 MG TABS tablet TAKE 1 TABLET BY MOUTH TWICE A DAY 11/26/16   Jettie Booze, MD  fexofenadine (ALLEGRA) 180 MG tablet Take 180 mg by mouth daily.    [provider]  fluticasone (FLONASE) 50 MCG/ACT nasal spray Place 1 spray into both nostrils daily as needed for allergies.  09/27/13   [provider]  furosemide (LASIX) 40 MG tablet Take 20 mg by mouth daily. 12/09/15   [provider]  guaiFENesin (MUCINEX) 600 MG 12 hr tablet Take 600 mg by mouth 2 (two) times daily.    [provider]  latanoprost (XALATAN) 0.005 % ophthalmic solution Place 1 drop into both eyes at bedtime. 08/11/14   [provider]  meclizine (ANTIVERT) 25 MG tablet Take 12.5 mg by mouth 3  (three) times daily as needed for dizziness.  04/01/14   [provider]  predniSONE (DELTASONE) 10 MG tablet Take 10 mg by mouth daily with breakfast.    [provider]  Respiratory Therapy Supplies (FLUTTER) DEVI Use as directed. 02/06/16   Marshell Garfinkel, MD    Physical Exam:  Vitals:   10/15/17 1035 10/15/17 1304 10/15/17 1315 10/15/17 1330  BP: 127/80 (!) 161/85 139/70 134/75  Pulse: 88 82 81 81  Resp: 18     Temp: 98.4 F (36.9 C)     TempSrc: Oral     SpO2: 98% (!) 86% (!) 86% (!) 89%   Constitutional: NAD, calm, somewhat uncomfortable due to pain  eyes: PERRL, lids and conjunctivae normal ENMT: Mucous membranes are moist, without exudate or lesions  Neck: normal, supple, no masses, no thyromegaly Respiratory: clear to auscultation bilaterally, no wheezing, no crackles. Normal respiratory effort  Cardiovascular: Irregularly irregular rate and rhythm,  murmur, rubs or gallops. No extremity edema. 2+ pedal pulses. No carotid bruits.  Abdomen: Soft, tender at the left lower quadrant no hepatosplenomegaly. Bowel sounds positive.  Musculoskeletal: no clubbing / cyanosis. Moves all extremities.  Very tender to palpation in the lower lumbar area Skin: Remarkable for large dermatome with herpetic zoster rash the left flank, and left back area, with dry crust, no apparent areas of fluidic lesions Neurologic: Sensation intact  Strength equal in all extremities Psychiatric:   Alert and oriented x 3. Normal mood.     Labs on Admission: I have personally reviewed following labs and imaging studies  CBC: Recent Labs  Lab 10/15/17 1055  WBC 16.2*  HGB 17.6*  HCT 54.7*  MCV 93.0  PLT 756    Basic Metabolic Panel: Recent Labs  Lab 10/15/17 1055  NA 136  K 4.8  CL 95*  CO2 24  GLUCOSE 124*  BUN 37*  CREATININE 2.67*  CALCIUM 10.0    GFR: CrCl cannot be calculated (Unknown ideal weight.).  Liver Function  Tests: No results for input(s): AST, ALT,  ALKPHOS, BILITOT, PROT, ALBUMIN in the last 168 hours. No results for input(s): LIPASE, AMYLASE in the last 168 hours. No results for input(s): AMMONIA in the last 168 hours.  Coagulation Profile: No results for input(s): INR, PROTIME in the last 168 hours.  Cardiac Enzymes: No results for input(s): CKTOTAL, CKMB, CKMBINDEX, TROPONINI in the last 168 hours.  BNP (last 3 results) No results for input(s): PROBNP in the last 8760 hours.  HbA1C: No results for input(s): HGBA1C in the last 72 hours.  CBG: No results for input(s): GLUCAP in the last 168 hours.  Lipid Profile: No results for input(s): CHOL, HDL, LDLCALC, TRIG, CHOLHDL, LDLDIRECT in the last 72 hours.  Thyroid Function Tests: No results for input(s): TSH, T4TOTAL, FREET4, T3FREE, THYROIDAB in the last 72 hours.  Anemia Panel: No results for input(s): VITAMINB12, FOLATE, FERRITIN, TIBC, IRON, RETICCTPCT in the last 72 hours.  Urine analysis:    Component Value Date/Time   COLORURINE YELLOW 12/23/2016 1546   APPEARANCEUR Clear 07/17/2017 1354   LABSPEC 1.025 12/23/2016 1546   PHURINE 5.5 12/23/2016 1546   GLUCOSEU Negative 07/17/2017 1354   HGBUR LARGE (A) 12/23/2016 1546   BILIRUBINUR Negative 07/17/2017 1354   KETONESUR NEGATIVE 12/23/2016 1546   PROTEINUR Negative 07/17/2017 1354   PROTEINUR 100 (A) 12/23/2016 1546   UROBILINOGEN 0.2 02/05/2011 0915   NITRITE Negative 07/17/2017 1354   NITRITE POSITIVE (A) 12/23/2016 1546   LEUKOCYTESUR Negative 07/17/2017 1354    Sepsis Labs: @LABRCNTIP (procalcitonin:4,lacticidven:4) )No results found for this or any previous visit (from the past 240 hour(s)).   Radiological Exams on Admission: Dg Chest 2 View  Result Date: 10/15/2017 CLINICAL DATA:  Hypoxia. EXAM: CHEST - 2 VIEW COMPARISON:  12/26/2015 FINDINGS: Cardiomediastinal silhouette is normal. Mediastinal contours appear intact. Calcific atherosclerotic disease and tortuosity of the aorta. There is no evidence  of focal airspace consolidation, pleural effusion or pneumothorax. Osseous structures are without acute abnormality. Soft tissues are grossly normal. IMPRESSION: No active cardiopulmonary disease. Calcific atherosclerotic disease and tortuosity of the aorta. Electronically Signed   By: Fidela Salisbury M.D.   On: 10/15/2017 14:36   Ct Renal Stone Study  Result Date: 10/15/2017 CLINICAL DATA:  Flank pain, stone disease suspected. Shingles. Symptoms for 1 week. Symptoms are worse today. EXAM: CT ABDOMEN AND PELVIS WITHOUT CONTRAST TECHNIQUE: Multidetector CT imaging of the abdomen and pelvis was performed following the standard protocol without IV contrast. COMPARISON:  CT of the abdomen and pelvis 12/24/2016. FINDINGS: Lower chest: Mild dependent atelectasis is present at both lung bases, right greater than left. No significant consolidation is present. There is no nodule or mass lesion. Coronary artery calcifications are present. Extensive mitral annular calcifications are present. No significant pleural or pericardial effusion is present. Hepatobiliary: No focal liver abnormality is seen. No gallstones, gallbladder wall thickening, or biliary dilatation. Pancreas: Unremarkable. No pancreatic ductal dilatation or surrounding inflammatory changes. Spleen: The spleen is within normal limits. Adrenals/Urinary Tract: Adrenals are within normal limits. Multiple exophytic lesions are again seen bilaterally. Right-sided lesions measure water density. 2 upper pole lesions posteriorly on the left measure slightly higher density. The larger measures 2.1 cm, decreased since the prior exam. Ureters are within normal limits. The urinary bladder is unremarkable. No stones are present. Stomach/Bowel: The stomach and duodenum are within normal limits for the small bowel is unremarkable. Terminal ileum is within normal limits. The appendix is visualized and normal. Ascending colon is normal transverse colon and  descending  colon are within normal limits. Diverticular changes are present within the sigmoid colon. There is wall thickening with minimal stranding. No fluid collection or free air is present. The rectum is within normal limits. Vascular/Lymphatic: Atherosclerotic calcifications are present in the aorta and branch vessels without aneurysm. No significant adenopathy is present. Reproductive: Status post hysterectomy. No adnexal masses. Other: No abdominal wall hernia or abnormality. No abdominopelvic ascites. Musculoskeletal: L1 comminuted vertebral body fracture is present. There is sparing of the posterior elements. Fracture involves predominantly the inferior endplate. A remote superior endplate fracture at Q59 is stable. Acute/subacute superior endplate fractures present at L4. Bone fragments are retropulsed 5 mm into the canal. Vertebral body heights at L2, L3, and L5 are preserved. IMPRESSION: 1. Diverticular disease with wall thickening and slight stranding in the sigmoid colon discerning for developing acute diverticulitis. 2. Acute/subacute vertebral body fractures at L1 and L4. Retropulsed bone at L4 extends 5 mm into the canal. 3.  Aortic Atherosclerosis (ICD10-I70.0).  No aneurysm is present. 4. Multiple bilateral exophytic lesions of both kidneys. Majority of these lesions are cystic. Some increase in size. These are incompletely evaluated without IV contrast. Given the interval growth, MRI of the abdomen without and with contrast is recommended. Electronically Signed   By: San Morelle M.D.   On: 10/15/2017 15:01    EKG: Independently reviewed.  Assessment/Plan Principal Problem:   Lumbar pain Active Problems:   History of herpes zoster   Acute diverticulitis   PAF (paroxysmal atrial fibrillation) (HCC)   CKD (chronic kidney disease), stage IV (HCC)   Essential hypertension   Anemia in chronic renal disease   Congestive heart failure (HCC)   Diverticulitis   Severe back pain, in the  setting of recent shingles, as well as history of vertebral body fractures in the L1 and L4, with some stenosis, for which she had recently a steroid injection and has been on steroid meds.  No apparent acute neurological abnormalities.  CT renal stone, confirmed these vertebral body fractures, but there are no new lesions.  Of note, there are some likely cystic multiple bilateral lesions in kidney.  White count is 16.2 in view of recent prednisone use and inflammation, pain, diverticulitis.  CT(see below).  Recent chest x-ray is negative for acute findings.  Patient received Dilaudid IV with some control of her pain.  She is also receiving IV fluids.  Of note, patient did not receive at the time of initial herpes zoster rash any antivirals, for which no this may not be an option.   Admit to MedSurg inpatient IV and oral pain control CBC in a.m. Continue deltasone At this time, will provide supportive therapy, as well as PT and OT, if symptoms worsen, will obtain orthopedic evaluation regarding her fractures other than that, she can follow-up as an outpatient. For her herpetic neuropathy, will consider starting the patient on Neurontin 300 mg qhs    Acute diverticulitis per CT, as above.  Patient does have a history of diverticulosis about 1 year ago, no apparent diagnosis of diverticulitis.  He received Cipro and Flagyl at the ER. White count 16.2.  Patient afebrile Liquid diet Continue cautious  IV hydration with normal saline. Protonix 40 mg IV every 24 hours. Continue Cipro and Flagyl Analgesics and antiemetics as needed. Consider surgical evaluation if no improvement.  Acute on Chronic CKD stage 4 : likely due to dehydration , diuretics, Cr 2.67  Lab Results  Component Value Date   CREATININE 2.67 (H)  10/15/2017   CREATININE 2.13 (H) 07/17/2017   CREATININE 1.98 (H) 03/10/2017   Hold diuretics and NSAIDS IVF    Atrial Fibrillation on anticoagulation with ELiquis . Last 2 D echo in 1115  showed   Systolic function was normal. EF  55% to 60%.   Rate controlled Continue Eliquis and Pacerone  Hyperlipidemia Continue home statins  Hypertension BP 134/75   Pulse 81   Lasix on hold Hydralazine for BP 160/90    DVT prophylaxis:  Eliquis  Code Status:    DNR  Family Communication:  Discussed with patient Disposition Plan: Expect patient to be discharged to home after condition improves Consults called:    None  Admission status: MEdsurg IP    Sharene Butters, PA-C [facility]   Amion text  [phone]   10/15/2017, 5:11 PM

## 2017-10-15 NOTE — Progress Notes (Signed)
Pharmacy Antibiotic Note  Heather Benson is a 84 y.o. female admitted on 10/15/2017 with intraabdominal infection.  Pharmacy has been consulted for ciprofloxacin dosing. Metronidazole ordered per EDP.   Plan: Metronidazole 500mg  IV q8h per MD Ciprofloxacin 400mg  IV q24h Monitor cultures, LOT, renal function    Temp (24hrs), Avg:98.4 F (36.9 C), Min:98.4 F (36.9 C), Max:98.4 F (36.9 C)  Recent Labs  Lab 10/15/17 1055  WBC 16.2*  CREATININE 2.67*    Estimated Creatinine Clearance: 16.5 mL/min (A) (by C-G formula based on SCr of 2.67 mg/dL (H)).    Allergies  Allergen Reactions  . Augmentin [Amoxicillin-Pot Clavulanate] Diarrhea and Nausea Only  . Boniva [Ibandronic Acid] Diarrhea  . Codeine Other (See Comments)    "Spaced out feeling" and Lightheadedness  . Hydromorphone Nausea And Vomiting  . Latex Other (See Comments)    Unknown  . Lisinopril Cough  . Tape Itching and Rash    Antimicrobials this admission: Metronidazole 6/5 >>  Ciprofloxacin 6/5 >>   Dose adjustments this admission: none  Microbiology results: none  Thank you for allowing pharmacy to be a part of this patient's care.  Arrie Senate, PharmD, BCPS PGY-2 Cardiology Pharmacy Resident Phone: [phone] 10/15/2017

## 2017-10-15 NOTE — ED Triage Notes (Addendum)
Patient arrives with [facility] with complaint of back pain related to shingles that has been going on for over a week but pain is worse today. Denies recent falls or injury. Patient alert, oriented, and ambulates short distances with assistance. Patient was seen for same back pain a few weeks ago, received an injection and was supposed to follow up with another doctor but was told to wait due to shingles. Patient states back pain has returned.  146/84 Hr 90 SpO2 94%

## 2017-10-15 NOTE — ED Notes (Signed)
Ice chips left at bedside for when pt returns from [place], per [place], Therapist, sports.

## 2017-10-15 NOTE — Progress Notes (Signed)
Received report on pt.

## 2017-10-15 NOTE — Progress Notes (Signed)
Heather Benson is a 84 y.o. female patient admitted from ED awake, alert - oriented  X 4 - no acute distress noted.  VSS - Blood pressure (!) 179/77, pulse 73, temperature 98 F (36.7 C), resp. rate 16, weight 74.8 kg (165 lb), SpO2 (!) 88 %.    IV in place, occlusive dsg intact without redness.  Orientation to room, and floor completed with information packet given to patient/family.  Patient declined safety video at this time.  Admission INP armband ID verified with patient/family, and in place.   SR up x 2, fall assessment complete, with patient and family able to verbalize understanding of risk associated with falls, and verbalized understanding to call nsg before up out of bed.  Call light within reach, patient able to voice, and demonstrate understanding.  Pt has some bruising on her arms and dry crusted shingles on the left side of her back.     Will cont to eval and treat per MD orders.  Celine Ahr, RN 10/15/2017 5:35 PM

## 2017-10-15 NOTE — ED Notes (Signed)
Patient's husband requesting something to drink/eat for patient - RN began reviewing patient's complaint and chart and encouraged patient to wait until I could speak with provider regarding whether or not patient may drink/eat. Patient's husband walked off as I was speaking with him and got drink from the vending machine.

## 2017-10-16 ENCOUNTER — Inpatient Hospital Stay (HOSPITAL_COMMUNITY): Payer: Medicare Other

## 2017-10-16 DIAGNOSIS — N179 Acute kidney failure, unspecified: Secondary | ICD-10-CM

## 2017-10-16 LAB — BASIC METABOLIC PANEL
ANION GAP: 12 (ref 5–15)
BUN: 35 mg/dL — ABNORMAL HIGH (ref 6–20)
CALCIUM: 9.3 mg/dL (ref 8.9–10.3)
CO2: 27 mmol/L (ref 22–32)
CREATININE: 2.43 mg/dL — AB (ref 0.44–1.00)
Chloride: 97 mmol/L — ABNORMAL LOW (ref 101–111)
GFR calc Af Amer: 20 mL/min — ABNORMAL LOW (ref >60)
GFR, EST NON AFRICAN AMERICAN: 17 mL/min — AB (ref >60)
GLUCOSE: 88 mg/dL (ref 65–99)
Potassium: 5.5 mmol/L — ABNORMAL HIGH (ref 3.5–5.1)
Sodium: 136 mmol/L (ref 135–145)

## 2017-10-16 LAB — URINALYSIS, ROUTINE W REFLEX MICROSCOPIC
Bilirubin Urine: NEGATIVE
Glucose, UA: NEGATIVE mg/dL
Ketones, ur: NEGATIVE mg/dL
Leukocytes, UA: NEGATIVE
Nitrite: NEGATIVE
PH: 5 (ref 5.0–8.0)
Protein, ur: NEGATIVE mg/dL
SPECIFIC GRAVITY, URINE: 1.011 (ref 1.005–1.030)

## 2017-10-16 LAB — APTT: aPTT: 30 seconds (ref 24–36)

## 2017-10-16 LAB — CBC
HCT: 47.4 % — ABNORMAL HIGH (ref 36.0–46.0)
Hemoglobin: 15.1 g/dL — ABNORMAL HIGH (ref 12.0–15.0)
MCH: 30.3 pg (ref 26.0–34.0)
MCHC: 31.9 g/dL (ref 30.0–36.0)
MCV: 95 fL (ref 78.0–100.0)
PLATELETS: 161 10*3/uL (ref 150–400)
RBC: 4.99 MIL/uL (ref 3.87–5.11)
RDW: 15.9 % — AB (ref 11.5–15.5)
WBC: 11.1 10*3/uL — ABNORMAL HIGH (ref 4.0–10.5)

## 2017-10-16 LAB — HEPARIN LEVEL (UNFRACTIONATED): Heparin Unfractionated: >2.2 IU/mL — ABNORMAL HIGH (ref 0.30–0.70)

## 2017-10-16 MED ORDER — SODIUM CHLORIDE 0.9 % IV SOLN
1.0000 g | INTRAVENOUS | Status: AC
  Administered 2017-10-16 – 2017-10-20 (×5): 1 g via INTRAVENOUS
  Filled 2017-10-16 (×5): qty 10

## 2017-10-16 MED ORDER — HEPARIN (PORCINE) IN NACL 100-0.45 UNIT/ML-% IJ SOLN
900.0000 [IU]/h | INTRAMUSCULAR | Status: DC
Start: 2017-10-16 — End: 2017-10-20
  Administered 2017-10-16 – 2017-10-17 (×2): 1050 [IU]/h via INTRAVENOUS
  Administered 2017-10-19: 900 [IU]/h via INTRAVENOUS
  Filled 2017-10-16 (×4): qty 250

## 2017-10-16 MED ORDER — LIDOCAINE 5 % EX PTCH
1.0000 | MEDICATED_PATCH | CUTANEOUS | Status: DC
  Administered 2017-10-16 – 2017-10-23 (×8): 1 via TRANSDERMAL
  Filled 2017-10-16 (×8): qty 1

## 2017-10-16 MED ORDER — PATIROMER SORBITEX CALCIUM 8.4 G PO PACK
8.4000 g | PACK | Freq: Once | ORAL | Status: AC
  Administered 2017-10-16: 8.4 g via ORAL
  Filled 2017-10-16 (×2): qty 1

## 2017-10-16 MED ORDER — CALCITONIN (SALMON) 200 UNIT/ACT NA SOLN
1.0000 | Freq: Every day | NASAL | Status: DC
  Administered 2017-10-16 – 2017-10-19 (×3): 1 via NASAL
  Filled 2017-10-16 (×2): qty 3.7

## 2017-10-16 NOTE — Care Management Note (Signed)
Case Management Note  Patient Details  Name: Heather Benson MRN: [id number] Date of Birth: 1931/12/23  Subjective/Objective:   Lumbar Pain                Action/Plan: PCP: [name], MD; has private insurance with [place]; awaiting for Physical Therapy eval for disposition needs; CM following of progression of care.  Expected Discharge Date:     Possibly 10/18/2017             Expected Discharge Plan:  [place]  Discharge planning Services  CM Consult  Status of Service:  In process, will continue to follow  Sherrilyn Rist [phone] 10/16/2017, 2:19 PM

## 2017-10-16 NOTE — Progress Notes (Addendum)
Occupational Therapy Evaluation Patient Details Name: Heather Benson MRN: [id number] DOB: 12-20-1931 Today's Date: 10/16/2017    History of Present Illness Heather Benson is a 84 y.o. female with medical history significant for DJD, hypertension, GERD, CKD stage III, recent shingles, brought to the emergency department with worsening lower back pain and left-sided abdominal pain.  She was seen by her orthopedic surgeon for the back pain 4 weeks ago and received a steroid injection. Her pain is complicated by development of shingles and acute diverticulitis seen on renal CT.    Clinical Impression   PTA, pt was living at home with her husband,Jack, and was modified independent with ADLs with intermittent use of SPC and husband and daughter assisted with IADLs. Pt unable to perform bed mobility due to intolerable LBP exacerbated with any LB movement. Awaiting results from CT scan and input from MD before progressing further with mobility. Will continue to follow acutely to address established goals to facilitate safe D/C home. At this time, due to limited mobility and total A required for ADL recommend SNF pending pt's progress. O      Follow Up Recommendations  SNF;Supervision/Assistance - 24 hour  - pending progress/plan of care   Equipment Recommendations       Recommendations for Other Services PT consult     Precautions / Restrictions Restrictions Weight Bearing Restrictions: No      Mobility Bed Mobility Overal bed mobility: Needs Assistance             General bed mobility comments: pt unable to tolerate bed mobility due to pain level   Transfers                 General transfer comment: unable to assess at this time due to increased pain level     Balance                                           ADL either performed or assessed with clinical judgement   ADL Overall ADL's : Needs assistance/impaired Eating/Feeding: Set up    Grooming: Set up   Upper Body Bathing: Set up Upper Body Bathing Details (indicate cue type and reason): set up for ventral UB bathing, unable to access dorsal due to increased pain Lower Body Bathing: Maximal assistance   Upper Body Dressing : Minimal assistance   Lower Body Dressing: Maximal assistance               Functional mobility during ADLs: (unable to assess due to pain level) General ADL Comments: pt significantly limited in ADL and functional mobility due to pain level     Vision Baseline Vision/History: Wears glasses Wears Glasses: Reading only Patient Visual Report: No change from baseline       Perception     Praxis      Pertinent Vitals/Pain Pain Assessment: Faces Pain Score: 5  Faces Pain Scale: Hurts worst Pain Location: lower back shoots into legs with any movement Pain Descriptors / Indicators: Grimacing;Sharp;Shooting Pain Intervention(s): Limited activity within patient's tolerance;Monitored during session     Hand Dominance Right   Extremity/Trunk Assessment Upper Extremity Assessment Upper Extremity Assessment: Generalized weakness   Lower Extremity Assessment Lower Extremity Assessment: RLE deficits/detail;LLE deficits/detail RLE Deficits / Details: plantarflexion 4/5 MMT, dorsiflexion 4/5 MMT; LE flexion limited due to pain RLE Sensation: WNL LLE Deficits / Details: plantarflexion 4/5  MMT, dorsiflexion 4/5 MMT; LE flexion limited due to pain LLE Sensation: WNL       Communication Communication Communication: No difficulties   Cognition Arousal/Alertness: Awake/alert Behavior During Therapy: Anxious Overall Cognitive Status: Within Functional Limits for tasks assessed                                 General Comments: appeared anxious and teary eyed with anticipated movement   General Comments  pt's husband and son present during session; educated pt/family on importance of mobility as tolerated to maintain  strength and decrease risk for pressure sores;recommended pt use bed control to adjust bed position as tolerate to assist with pressure sore prevention; discussed environmental modifications to reduce risk of falls;discussed use of ice/heat for pain management    Exercises     Shoulder Instructions      Home Living Family/patient expects to be discharged to:: Private residence Living Arrangements: Spouse/significant other Available Help at Discharge: Family Type of Home: House       Home Layout: One level     Bathroom Shower/Tub: Occupational psychologist: Handicapped height Bathroom Accessibility: Yes How Accessible: Accessible via walker Home Equipment: Environmental consultant - 2 wheels;Cane - single point;Hand held shower head;Grab bars - tub/shower;Shower Benson - built in   Additional Comments: use SPC for stability when she remembered (reports frequently forgetting to use it)      Prior Functioning/Environment Level of Independence: Needs assistance  Gait / Transfers Assistance Needed: modified independent with intermittent use of SPC for stability  ADL's / Homemaking Assistance Needed: husband assisted with ted hose, and laundry; daughter assisted with cleaning; otherwise Independent with ADLs   Comments: no history of falls        OT Problem List: Decreased strength;Decreased range of motion;Decreased activity tolerance;Impaired balance (sitting and/or standing);Pain      OT Treatment/Interventions: Self-care/ADL training;Therapeutic exercise;Energy conservation;DME and/or AE instruction;Therapeutic activities;Patient/family education;Balance training;Modalities    OT Goals(Current goals can be found in the care plan section) Acute Rehab OT Goals Patient Stated Goal: to get better OT Goal Formulation: With patient Time For Goal Achievement: 10/30/17 Potential to Achieve Goals: Good  OT Frequency: Min 2X/week   Barriers to D/C:            Co-evaluation               AM-PAC PT "6 Clicks" Daily Activity     Outcome Measure Help from another person eating meals?: None Help from another person taking care of personal grooming?: Total Help from another person toileting, which includes using toliet, bedpan, or urinal?: Total Help from another person bathing (including washing, rinsing, drying)?: A Lot Help from another person to put on and taking off regular upper body clothing?: A Little Help from another person to put on and taking off regular lower body clothing?: A Lot 6 Click Score: 13   End of Session Nurse Communication: Mobility status(medication for pain)  Activity Tolerance: Patient limited by pain Patient left: in bed;with call bell/phone within reach;with family/visitor present  OT Visit Diagnosis: Other abnormalities of gait and mobility (R26.89);Pain;Muscle weakness (generalized) (M62.81) Pain - part of body: (back)                Time: 1353-1416 OT Time Calculation (min): 23 min Charges:    G-Codes:     Dorinda Hill OTS  Session completed under supervision of licensed clinicain.  Maurie Boettcher, OT/L  OT Clinical Specialist [phone]     10/16/2017, 3:05 PM

## 2017-10-16 NOTE — H&P (Addendum)
Patient ID: HARMONIE VERRASTRO MRN: [id number] DOB/AGE: 82-Jan-1933 82 y.o.  Admit date: 10/15/2017  Admission Diagnoses:  Principal Problem:   Lumbar pain Active Problems:   PAF (paroxysmal atrial fibrillation) (HCC)   CKD (chronic kidney disease), stage IV (HCC)   Essential hypertension   Anemia in chronic renal disease   Congestive heart failure (HCC)   History of herpes zoster   Acute diverticulitis   Diverticulitis   Acute renal failure (HCC)   HPI: Very pleasant 84 year old female pt presents to the hospital yesterday with 1 week of severe low back pain.  The pt has had shingles for over 3 weeks.  The pt also carries comorbidities of heart disease and CKD.  The pt said her PCP had been giving her pain medication to address pain from shingles. She said her back pain had gotten so severe she could not deal with it any longer.   Past Medical History: Past Medical History:  Diagnosis Date  . Aortic atherosclerosis ([place]) 12/25/2016  . Aortic stenosis, mild 12/14/2015  . Arthritis 08-28-11   right knee pain-surgery planned  . Cancer [facility]) 08-28-11   Melonoma-cheek(many Yrs ago) , recent bx. left  face lesion, past skin cancer lesions  . CAP (community acquired pneumonia) 12/13/2015  . CHF (congestive heart failure) ([place])   . Complication of anesthesia 08-28-11   in past arrythmia-  cardiology has seen in past  . Dysrhythmia 08-28-11   only immed.  to several days after surgery- hx. A.fib. in past  . GERD (gastroesophageal reflux disease) 08-28-11   tx. omeprazole  . Hypercholesterolemia   . Hypertension 08-28-11   tx. meds  . IBS (irritable bowel syndrome)   . Lymphedema   . Neuromuscular disorder ([place]) 08-28-11   hx. Polio age 40-slight residual affects legs  . Osteoarthritis    back and left knee  . Osteoporosis   . Pneumonia 12/2015   hospital x 3 days  . PONV (postoperative nausea and vomiting)   . Renal cyst, right   . Sarcoidosis   . Swelling of extremity 08-28-11     bilateral lower extremities- mid calf down    Surgical History: Past Surgical History:  Procedure Laterality Date  . ABDOMINAL HYSTERECTOMY  08-28-11   Partial-vaginal approach  . BREAST REDUCTION SURGERY  1997  . BUNIONECTOMY  08-28-11   right  . CARDIOVERSION N/A 08/05/2014   Procedure: CARDIOVERSION;  Surgeon: Larey Dresser, MD;  Location: [place];  Service: Cardiovascular;  Laterality: N/A;  . CATARACT EXTRACTION, BILATERAL  08-28-11   bilateral  . COLONOSCOPY WITH PROPOFOL N/A 12/24/2016   Procedure: COLONOSCOPY WITH PROPOFOL;  Surgeon: Wonda Horner, MD;  [facility] ENDOSCOPY;  Service: Endoscopy;  Laterality: N/A;  . EYE SURGERY     Cataract  . JOINT REPLACEMENT  Oct. 1, 2012   Right Knee  . KNEE ARTHROSCOPY  09/06/2011   Procedure: ARTHROSCOPY KNEE;  Surgeon: Gearlean Alf, MD;  Location: [facility] ORS;  Service: Orthopedics;  Laterality: Right;  with Synovectomy    Family History: Family History  Problem Relation Age of Onset  . Heart disease Mother        Varicose Veins  . Hyperlipidemia Mother   . Hypertension Mother   . Colon cancer Mother   . Heart disease Father        Heart Disease before age 2  . Hypertension Father   . Emphysema Father   . Breast cancer Sister   .  Stroke Maternal Grandmother   . Heart attack Brother     Social History: Social History   Socioeconomic History  . Marital status: Married    Spouse name: [name]  . Number of children: 3  . Years of education: 64  . Highest education level: Not on file  Occupational History  . Occupation: Retired- [employer]  . Financial resource strain: Not on file  . Food insecurity:    Worry: Not on file    Inability: Not on file  . Transportation needs:    Medical: Not on file    Non-medical: Not on file  Tobacco Use  . Smoking status: Never Smoker  . Smokeless tobacco: Never Used  Substance and Sexual Activity  . Alcohol use: No    Alcohol/week: 0.0 oz  . Drug use: No   . Sexual activity: Not Currently    Partners: Male  Lifestyle  . Physical activity:    Days per week: Not on file    Minutes per session: Not on file  . Stress: Not on file  Relationships  . Social connections:    Talks on phone: Not on file    Gets together: Not on file    Attends religious service: Not on file    Active member of club or organization: Not on file    Attends meetings of clubs or organizations: Not on file    Relationship status: Not on file  . Intimate partner violence:    Fear of current or ex partner: Not on file    Emotionally abused: Not on file    Physically abused: Not on file    Forced sexual activity: Not on file  Other Topics Concern  . Not on file  Social History Narrative   Lives with husband, Hilbert Odor at home   Caffeine use: Drinks coffee/tea/caffeine free soda   OCCUPATION: retired     Allergies: Augmentin [amoxicillin-pot clavulanate]; Boniva [ibandronic acid]; Codeine; Hydromorphone; Latex; Lisinopril; and Adhesive [tape]  Medications: I have reviewed the patient's current medications.  Vital Signs: Patient Vitals for the past 24 hrs:  BP Temp Temp src Pulse Resp SpO2 Height Weight  10/16/17 0900 128/67 - - - - - - -  10/16/17 0620 (!) 150/65 97.6 F (36.4 C) Oral 64 18 98 % - -  10/16/17 0500 - - - - - - - 79.5 kg (175 lb 4.3 oz)  10/15/17 2156 132/76 98.1 F (36.7 C) Oral 72 18 94 % - -  10/15/17 1733 (!) 179/77 98 F (36.7 C) - 73 16 (!) 88 % 5\' 6"  (1.676 m) 78.4 kg (172 lb 13.5 oz)  10/15/17 1711 (!) 146/70 - - 74 16 90 % - 74.8 kg (165 lb)    Radiology: Dg Chest 2 View  Result Date: 10/15/2017 CLINICAL DATA:  Hypoxia. EXAM: CHEST - 2 VIEW COMPARISON:  12/26/2015 FINDINGS: Cardiomediastinal silhouette is normal. Mediastinal contours appear intact. Calcific atherosclerotic disease and tortuosity of the aorta. There is no evidence of focal airspace consolidation, pleural effusion or pneumothorax. Osseous structures are without acute  abnormality. Soft tissues are grossly normal. IMPRESSION: No active cardiopulmonary disease. Calcific atherosclerotic disease and tortuosity of the aorta. Electronically Signed   By: Fidela Salisbury M.D.   On: 10/15/2017 14:36   Ct Lumbar Spine Wo Contrast  Result Date: 10/16/2017 CLINICAL DATA:  Back pain corresponding to shingles for 2 weeks. No injury. History of melanoma. EXAM: CT LUMBAR SPINE WITHOUT CONTRAST TECHNIQUE: Multidetector  CT imaging of the lumbar spine was performed without intravenous contrast administration. Multiplanar CT image reconstructions were also generated. COMPARISON:  CT abdomen and pelvis October 15, 2017 FINDINGS: SEGMENTATION: For the purposes of this report the last well-formed intervertebral disc space is reported as L5-S1. ALIGNMENT: Maintained lumbar lordosis. No malalignment. VERTEBRAE: Acute L1 burst fracture, 12 mm in craniocaudad dimension, 15 mm in craniocaudad dimension yesterday. 4 mm L1 retropulsed bony fragments. Subacute appearing L4 compression fracture with approximately 50% height loss, stable from yesterday. Or mm L4 retropulsed bony fragments. Old moderate T12 compression fracture. Remaining lumbar vertebral bodies intact moderate-to-severe L5-S1 disc height loss with vacuum disc and endplate spurring compatible with degenerative disc. Remaining disc heights maintained. Osteopenia without destructive bony lesions. PARASPINAL AND OTHER SOFT TISSUES: Moderate paraspinal muscle atrophy. Severe calcific atherosclerosis aorta which measures up to 2 cm. Numerous complex renal cysts, incompletely evaluated with small nonobstructing left nephrolithiasis. DISC LEVELS: T11-12: Endplate spurring directed towards the right. No canal stenosis. Mild right neural foraminal narrowing. T12-L1: No disc bulge, canal stenosis nor neural foraminal narrowing. L1-2: Mild canal stenosis at L1 due to retropulsed bony fragments with narrowed lateral recesses which may affect the  traversing L1 nurse. No neural foraminal narrowing. L2-3: No disc bulge, canal stenosis nor neural foraminal narrowing. L3-4: Retropulsed bony fragments, mild facet arthropathy. No canal stenosis or neural foraminal narrowing. L4-5: Small broad-based right subarticular disc osteophyte complex. Mild facet arthropathy. No canal stenosis. Mild bilateral neural foraminal narrowing. L5-S1: Right extraforaminal small disc osteophyte complex. No canal stenosis. Mild right neural foraminal narrowing and encroachment upon the exiting right L5 nerve. IMPRESSION: 1. Acute L1 burst fracture with 20% further height loss from yesterday's CT with further retropulsion. Mild canal stenosis at L1. 2. Subacute appearing moderate L4 burst fracture with similar retropulsed bony fragments. Old moderate T12 compression fracture. 3. Mild neural foraminal narrowing T11-12, L4-5 and L5-S1. 4. These results will be called to the ordering clinician or representative by the Radiologist Assistant, and communication documented in the PACS or zVision Dashboard. Aortic Atherosclerosis (ICD10-I70.0). Electronically Signed   By: Elon Alas M.D.   On: 10/16/2017 14:05   Ct Renal Stone Study  Result Date: 10/15/2017 CLINICAL DATA:  Flank pain, stone disease suspected. Shingles. Symptoms for 1 week. Symptoms are worse today. EXAM: CT ABDOMEN AND PELVIS WITHOUT CONTRAST TECHNIQUE: Multidetector CT imaging of the abdomen and pelvis was performed following the standard protocol without IV contrast. COMPARISON:  CT of the abdomen and pelvis 12/24/2016. FINDINGS: Lower chest: Mild dependent atelectasis is present at both lung bases, right greater than left. No significant consolidation is present. There is no nodule or mass lesion. Coronary artery calcifications are present. Extensive mitral annular calcifications are present. No significant pleural or pericardial effusion is present. Hepatobiliary: No focal liver abnormality is seen. No  gallstones, gallbladder wall thickening, or biliary dilatation. Pancreas: Unremarkable. No pancreatic ductal dilatation or surrounding inflammatory changes. Spleen: The spleen is within normal limits. Adrenals/Urinary Tract: Adrenals are within normal limits. Multiple exophytic lesions are again seen bilaterally. Right-sided lesions measure water density. 2 upper pole lesions posteriorly on the left measure slightly higher density. The larger measures 2.1 cm, decreased since the prior exam. Ureters are within normal limits. The urinary bladder is unremarkable. No stones are present. Stomach/Bowel: The stomach and duodenum are within normal limits for the small bowel is unremarkable. Terminal ileum is within normal limits. The appendix is visualized and normal. Ascending colon is normal transverse colon and descending colon are  within normal limits. Diverticular changes are present within the sigmoid colon. There is wall thickening with minimal stranding. No fluid collection or free air is present. The rectum is within normal limits. Vascular/Lymphatic: Atherosclerotic calcifications are present in the aorta and branch vessels without aneurysm. No significant adenopathy is present. Reproductive: Status post hysterectomy. No adnexal masses. Other: No abdominal wall hernia or abnormality. No abdominopelvic ascites. Musculoskeletal: L1 comminuted vertebral body fracture is present. There is sparing of the posterior elements. Fracture involves predominantly the inferior endplate. A remote superior endplate fracture at H47 is stable. Acute/subacute superior endplate fractures present at L4. Bone fragments are retropulsed 5 mm into the canal. Vertebral body heights at L2, L3, and L5 are preserved. IMPRESSION: 1. Diverticular disease with wall thickening and slight stranding in the sigmoid colon discerning for developing acute diverticulitis. 2. Acute/subacute vertebral body fractures at L1 and L4. Retropulsed bone at L4  extends 5 mm into the canal. 3.  Aortic Atherosclerosis (ICD10-I70.0).  No aneurysm is present. 4. Multiple bilateral exophytic lesions of both kidneys. Majority of these lesions are cystic. Some increase in size. These are incompletely evaluated without IV contrast. Given the interval growth, MRI of the abdomen without and with contrast is recommended. Electronically Signed   By: San Morelle M.D.   On: 10/15/2017 15:01    Labs: Recent Labs    10/15/17 1055 10/16/17 0718  WBC 16.2* 11.1*  RBC 5.88* 4.99  HCT 54.7* 47.4*  PLT 223 161   Recent Labs    10/15/17 1055 10/16/17 0718  NA 136 136  K 4.8 5.5*  CL 95* 97*  CO2 24 27  BUN 37* 35*  CREATININE 2.67* 2.43*  GLUCOSE 124* 88  CALCIUM 10.0 9.3   No results for input(s): LABPT, INR in the last 72 hours.  Review of Systems: ROS  Physical Exam: Body mass index is 28.29 kg/m.  Physical Exam  Constitutional: She is oriented to person, place, and time. She appears well-developed and well-nourished.  HENT:  Head: Normocephalic.  GI: Soft. Bowel sounds are normal.  Neurological: She is alert and oriented to person, place, and time.  Skin: Skin is warm and dry.  Psychiatric: She has a normal mood and affect. Her behavior is normal. Judgment and thought content normal.   Shingles across low back and abdomen notes  Exquisite low back pain with movement No sensation changes lower extremities No motor deficits  Assessment and Plan: Acute L1 burst fracture Subacute L4 fracture Chronic T12 fracture Kyphoplasty will be considered Pt needs to continue to be managed for other co-morbidities  Ronette Deter, PAC for Melina Schools, MD [facility] [phone]  Agree with above Will review MRI today Pain most likely from acute L1 fracture and perhaps the T12 and L4. Recommend kyphoplasty for pain control.  Will need to delay surgery due to [facility].  Patient off eliquis and on heparin for afib Recommend  contacting interventional radiology for the kyphoplasty - they would be able to address the fracture before my schedule would allow.  If, however, the procedure can't be done on Monday then I would be available on Tuesday to perform the surgery.   Also suggest nasal calcitonin for pain control. Will sign off - please call if issues arise

## 2017-10-16 NOTE — Progress Notes (Signed)
OT Note - Addendum    10/16/17 1700  OT Visit Information  Last OT Received On 10/16/17  OT Time Calculation  OT Start Time (ACUTE ONLY) 1353  OT Stop Time (ACUTE ONLY) 1416  OT Time Calculation (min) 23 min  OT General Charges  $OT Visit 1 Visit  OT Evaluation  $OT Eval Moderate Complexity 1 Mod  Maurie Boettcher, OT/L  OT Clinical Specialist [phone]

## 2017-10-16 NOTE — Progress Notes (Addendum)
PROGRESS NOTE    Heather Benson  PZW:[id number] DOB: 1932-02-08 DOA: 10/15/2017 PCP: [name], MD    Brief Narrative: Patient is a 84 year old female who presented with worsening lower back pain and left-sided abdominal pain. Of note she was seen by her orthopedic surgeon for the back pain 4 weeks ago and received a steroid injection. Her pain is complicated by development of shingles and acute diverticulitis seen on renal CT.    Assessment & Plan: Back pain in the setting of recent shingles and history L1 and L4 fracture: Spoke with Dr. Rolena Infante with [facility], per recommendation will obtain lumbar CT today. Continue current pain management and prednisone.   Herpes zoster: Lesion have crusted over, continue low dose Neurontin for additional pain management and continue to monior for secondary infcetions  Acute diverticulitis: Incidentally found on renal CT scan, change Cipro to Ceftriaxone per pharmacy and continue Flagyl. Continue IV fluids and diet advanced to soft foods  Acute on Chronic CKD stage 4: Likely due to dehydration creatine improving, continue IV fluids and continue to hold home lasix and avid nephrotoxins   Hyperkalemia: Potassium level 5.5 this am with know CKD stage 4, treat with Veltassa today and continue to monitor labs daily   Atrial Fibrillation: Rate remains controlled on Pacerone, continue Eliquis   Hyperlipidemia: Continue home statin   Hypertension: Controlled, continue to hold home Lasix, PRN Hydralazine as needed    DVT prophylaxis: Eliquis  Code Status: DNR Family Communication: No family at bedside during morning assessment  Disposition Plan: Remain inpatient-will require a few mores days of treatment before medically ready for discharge   Consultants:   Orthopedic-Dr. Rolena Infante   Procedures:   None  Antimicrobials:  Anti-infectives (From admission, onward)   Start     Dose/Rate Route Frequency Ordered Stop   10/16/17 1700   ciprofloxacin (CIPRO) IVPB 400 mg  Status:  Discontinued     400 mg 200 mL/hr over 60 Minutes Intravenous Every 24 hours 10/15/17 1723 10/16/17 0849   10/16/17 1000  cefTRIAXone (ROCEPHIN) 1 g in sodium chloride 0.9 % 100 mL IVPB     1 g 200 mL/hr over 30 Minutes Intravenous Every 24 hours 10/16/17 0850     10/16/17 0200  metroNIDAZOLE (FLAGYL) IVPB 500 mg     500 mg 100 mL/hr over 60 Minutes Intravenous Every 8 hours 10/15/17 1704     10/15/17 1545  ciprofloxacin (CIPRO) IVPB 400 mg     400 mg 200 mL/hr over 60 Minutes Intravenous  Once 10/15/17 1534 10/15/17 1758   10/15/17 1545  metroNIDAZOLE (FLAGYL) IVPB 500 mg     500 mg 100 mL/hr over 60 Minutes Intravenous  Once 10/15/17 1534 10/15/17 2000          Subjective: Patient resting in bed upon assessment this morning, she states the pain have imporved but is not fully resolved yet.   Objective: Vitals:   10/15/17 1733 10/15/17 2156 10/16/17 0500 10/16/17 0620  BP: (!) 179/77 132/76  (!) 150/65  Pulse: 73 72  64  Resp: 16 18  18   Temp: 98 F (36.7 C) 98.1 F (36.7 C)  97.6 F (36.4 C)  TempSrc:  Oral  Oral  SpO2: (!) 88% 94%  98%  Weight: 78.4 kg (172 lb 13.5 oz)  79.5 kg (175 lb 4.3 oz)   Height: 5\' 6"  (1.676 m)       Intake/Output Summary (Last 24 hours) at 10/16/2017 4235 Last data filed at  10/16/2017 0400 Gross per 24 hour  Intake 874.5 ml  Output -  Net 874.5 ml   Filed Weights   10/15/17 1711 10/15/17 1733 10/16/17 0500  Weight: 74.8 kg (165 lb) 78.4 kg (172 lb 13.5 oz) 79.5 kg (175 lb 4.3 oz)    Examination: General exam: Appears calm and comfortable  Respiratory system: Clear to auscultation. Respiratory effort normal. Cardiovascular system: S1 & S2 heard,. No JVD, murmurs, rubs, gallops or clicks. No pedal edema. Gastrointestinal system: Abdomen is nondistended, soft and and remains slightly tender to left lower quadrant. No organomegaly or masses felt. Normal bowel sounds heard. Central nervous system:  Alert and oriented. No focal neurological deficits. Extremities: Symmetric 5 x 5 power. Skin: No rashes, lesions or ulcers Psychiatry: Judgement and insight appear normal. Mood & affect appropriate.   Data Reviewed: I have personally reviewed following labs and imaging studies  CBC: Recent Labs  Lab 10/15/17 1055 10/16/17 0718  WBC 16.2* 11.1*  HGB 17.6* 15.1*  HCT 54.7* 47.4*  MCV 93.0 95.0  PLT 223 295   Basic Metabolic Panel: Recent Labs  Lab 10/15/17 1055  NA 136  K 4.8  CL 95*  CO2 24  GLUCOSE 124*  BUN 37*  CREATININE 2.67*  CALCIUM 10.0   GFR: Estimated Creatinine Clearance: 17 mL/min (A) (by C-G formula based on SCr of 2.67 mg/dL (H)). Liver Function Tests: No results for input(s): AST, ALT, ALKPHOS, BILITOT, PROT, ALBUMIN in the last 168 hours. No results for input(s): LIPASE, AMYLASE in the last 168 hours. No results for input(s): AMMONIA in the last 168 hours. Coagulation Profile: No results for input(s): INR, PROTIME in the last 168 hours. Cardiac Enzymes: No results for input(s): CKTOTAL, CKMB, CKMBINDEX, TROPONINI in the last 168 hours. BNP (last 3 results) No results for input(s): PROBNP in the last 8760 hours. HbA1C: No results for input(s): HGBA1C in the last 72 hours. CBG: No results for input(s): GLUCAP in the last 168 hours. Lipid Profile: No results for input(s): CHOL, HDL, LDLCALC, TRIG, CHOLHDL, LDLDIRECT in the last 72 hours. Thyroid Function Tests: No results for input(s): TSH, T4TOTAL, FREET4, T3FREE, THYROIDAB in the last 72 hours. Anemia Panel: No results for input(s): VITAMINB12, FOLATE, FERRITIN, TIBC, IRON, RETICCTPCT in the last 72 hours. Urine analysis:    Component Value Date/Time   COLORURINE YELLOW 12/23/2016 1546   APPEARANCEUR Clear 07/17/2017 1354   LABSPEC 1.025 12/23/2016 1546   PHURINE 5.5 12/23/2016 1546   GLUCOSEU Negative 07/17/2017 1354   HGBUR LARGE (A) 12/23/2016 1546   BILIRUBINUR Negative 07/17/2017 1354     KETONESUR NEGATIVE 12/23/2016 1546   PROTEINUR Negative 07/17/2017 1354   PROTEINUR 100 (A) 12/23/2016 1546   UROBILINOGEN 0.2 02/05/2011 0915   NITRITE Negative 07/17/2017 1354   NITRITE POSITIVE (A) 12/23/2016 1546   LEUKOCYTESUR Negative 07/17/2017 1354   Sepsis Labs: @LABRCNTIP (procalcitonin:4,lacticidven:4)  )No results found for this or any previous visit (from the past 240 hour(s)).    Radiology Studies: Dg Chest 2 View  Result Date: 10/15/2017 CLINICAL DATA:  Hypoxia. EXAM: CHEST - 2 VIEW COMPARISON:  12/26/2015 FINDINGS: Cardiomediastinal silhouette is normal. Mediastinal contours appear intact. Calcific atherosclerotic disease and tortuosity of the aorta. There is no evidence of focal airspace consolidation, pleural effusion or pneumothorax. Osseous structures are without acute abnormality. Soft tissues are grossly normal. IMPRESSION: No active cardiopulmonary disease. Calcific atherosclerotic disease and tortuosity of the aorta. Electronically Signed   By: Fidela Salisbury M.D.   On: 10/15/2017 14:36  Ct Renal Stone Study  Result Date: 10/15/2017 CLINICAL DATA:  Flank pain, stone disease suspected. Shingles. Symptoms for 1 week. Symptoms are worse today. EXAM: CT ABDOMEN AND PELVIS WITHOUT CONTRAST TECHNIQUE: Multidetector CT imaging of the abdomen and pelvis was performed following the standard protocol without IV contrast. COMPARISON:  CT of the abdomen and pelvis 12/24/2016. FINDINGS: Lower chest: Mild dependent atelectasis is present at both lung bases, right greater than left. No significant consolidation is present. There is no nodule or mass lesion. Coronary artery calcifications are present. Extensive mitral annular calcifications are present. No significant pleural or pericardial effusion is present. Hepatobiliary: No focal liver abnormality is seen. No gallstones, gallbladder wall thickening, or biliary dilatation. Pancreas: Unremarkable. No pancreatic ductal dilatation  or surrounding inflammatory changes. Spleen: The spleen is within normal limits. Adrenals/Urinary Tract: Adrenals are within normal limits. Multiple exophytic lesions are again seen bilaterally. Right-sided lesions measure water density. 2 upper pole lesions posteriorly on the left measure slightly higher density. The larger measures 2.1 cm, decreased since the prior exam. Ureters are within normal limits. The urinary bladder is unremarkable. No stones are present. Stomach/Bowel: The stomach and duodenum are within normal limits for the small bowel is unremarkable. Terminal ileum is within normal limits. The appendix is visualized and normal. Ascending colon is normal transverse colon and descending colon are within normal limits. Diverticular changes are present within the sigmoid colon. There is wall thickening with minimal stranding. No fluid collection or free air is present. The rectum is within normal limits. Vascular/Lymphatic: Atherosclerotic calcifications are present in the aorta and branch vessels without aneurysm. No significant adenopathy is present. Reproductive: Status post hysterectomy. No adnexal masses. Other: No abdominal wall hernia or abnormality. No abdominopelvic ascites. Musculoskeletal: L1 comminuted vertebral body fracture is present. There is sparing of the posterior elements. Fracture involves predominantly the inferior endplate. A remote superior endplate fracture at J50 is stable. Acute/subacute superior endplate fractures present at L4. Bone fragments are retropulsed 5 mm into the canal. Vertebral body heights at L2, L3, and L5 are preserved. IMPRESSION: 1. Diverticular disease with wall thickening and slight stranding in the sigmoid colon discerning for developing acute diverticulitis. 2. Acute/subacute vertebral body fractures at L1 and L4. Retropulsed bone at L4 extends 5 mm into the canal. 3.  Aortic Atherosclerosis (ICD10-I70.0).  No aneurysm is present. 4. Multiple bilateral  exophytic lesions of both kidneys. Majority of these lesions are cystic. Some increase in size. These are incompletely evaluated without IV contrast. Given the interval growth, MRI of the abdomen without and with contrast is recommended. Electronically Signed   By: San Morelle M.D.   On: 10/15/2017 15:01    Scheduled Meds: . acetaminophen  1,000 mg Oral Q6H  . amiodarone  200 mg Oral Daily  . apixaban  2.5 mg Oral BID  . atorvastatin  10 mg Oral Daily  . budesonide (PULMICORT) nebulizer solution  0.25 mg Nebulization BID  . furosemide  20 mg Oral Daily  . gabapentin  100 mg Oral QHS  . latanoprost  1 drop Both Eyes QHS  . pantoprazole  40 mg Oral Q0600  . predniSONE  10 mg Oral Q breakfast   Continuous Infusions: . sodium chloride 50 mL/hr at 10/15/17 1657  . ciprofloxacin    . metronidazole Stopped (10/16/17 0245)     LOS: 1 day   Time spent:  20 minutes-Greater than 50% of this time was spent in counseling, explanation of diagnosis, planning of further management, and coordination  of care.  Johnsie Cancel, NP [facility] Pager [phone]  If 7PM-7AM, please contact night-coverage [email or web address] Password [email or web address] 10/16/2017, 8:22 AM

## 2017-10-16 NOTE — Progress Notes (Signed)
PT Cancellation Note  Patient Details Name: Heather Benson MRN: [id number] DOB: 1932/04/22   Cancelled Treatment:    Reason Eval/Treat Not Completed: Medical issues which prohibited therapy  PT presenting for evaluation. Nursing and MD in room - requesting no PT today due to pain. Stating patient to be on bedrest currently.   Lanney Gins, PT, DPT 10/16/17 2:57 PM

## 2017-10-16 NOTE — Progress Notes (Signed)
ANTICOAGULATION CONSULT NOTE - Initial Consult  Pharmacy Consult for heparin Indication: atrial fibrillation  Allergies  Allergen Reactions  . Augmentin [Amoxicillin-Pot Clavulanate] Diarrhea and Nausea Only  . Boniva [Ibandronic Acid] Diarrhea  . Codeine Other (See Comments)    "Spaced out feeling" and Lightheadedness  . Hydromorphone Nausea And Vomiting  . Latex Rash    "Burns me"  . Lisinopril Cough  . Adhesive [Tape] Itching and Rash    Patient Measurements: Height: 5\' 6"  (167.6 cm) Weight: 175 lb 4.3 oz (79.5 kg) IBW/kg (Calculated) : 59.3 Heparin Dosing Weight: 75 kg  Vital Signs: Temp: 98 F (36.7 C) (06/06 1618) Temp Source: Axillary (06/06 1618) BP: 140/74 (06/06 1618) Pulse Rate: 68 (06/06 1618)  Labs: Recent Labs    10/15/17 1055 10/16/17 0718  HGB 17.6* 15.1*  HCT 54.7* 47.4*  PLT 223 161  CREATININE 2.67* 2.43*    Estimated Creatinine Clearance: 18.7 mL/min (A) (by C-G formula based on SCr of 2.43 mg/dL (H)).   Medical History: Past Medical History:  Diagnosis Date  . Aortic atherosclerosis ([place]) 12/25/2016  . Aortic stenosis, mild 12/14/2015  . Arthritis 08-28-11   right knee pain-surgery planned  . Cancer [facility]) 08-28-11   Melonoma-cheek(many Yrs ago) , recent bx. left  face lesion, past skin cancer lesions  . CAP (community acquired pneumonia) 12/13/2015  . CHF (congestive heart failure) ([place])   . Complication of anesthesia 08-28-11   in past arrythmia-  cardiology has seen in past  . Dysrhythmia 08-28-11   only immed.  to several days after surgery- hx. A.fib. in past  . GERD (gastroesophageal reflux disease) 08-28-11   tx. omeprazole  . Hypercholesterolemia   . Hypertension 08-28-11   tx. meds  . IBS (irritable bowel syndrome)   . Lymphedema   . Neuromuscular disorder ([place]) 08-28-11   hx. Polio age 54-slight residual affects legs  . Osteoarthritis    back and left knee  . Osteoporosis   . Pneumonia 12/2015   hospital x 3 days  . PONV  (postoperative nausea and vomiting)   . Renal cyst, right   . Sarcoidosis   . Swelling of extremity 08-28-11   bilateral lower extremities- mid calf down    Medications:  Medications Prior to Admission  Medication Sig Dispense Refill Last Dose  . acetaminophen (TYLENOL) 500 MG tablet Take 1,000 mg by mouth every 8 (eight) hours as needed (for pain).    Unk at [facility]  . amiodarone (PACERONE) 200 MG tablet Take 1 tablet (200 mg total) by mouth daily. 30 tablet 3 10/14/2017 at Unknown time  . atorvastatin (LIPITOR) 10 MG tablet Take 1 tablet (10 mg total) by mouth daily. 90 tablet 3 10/14/2017 at Unknown time  . beclomethasone (QVAR) 80 MCG/ACT inhaler Inhale 2 puffs into the lungs 2 (two) times daily as needed (for flares).    Unk at [employer]  . ELIQUIS 2.5 MG TABS tablet TAKE 1 TABLET BY MOUTH TWICE A DAY 60 tablet 9 10/14/2017 at 2100  . fexofenadine (ALLEGRA) 180 MG tablet Take 180 mg by mouth daily as needed for allergies.    Unk at [facility]  . fluticasone (FLONASE) 50 MCG/ACT nasal spray Place 1 spray into both nostrils daily as needed for allergies.    Unk at [facility]  . furosemide (LASIX) 40 MG tablet Take 40 mg by mouth See admin instructions. Take 40 mg by mouth once a day and may take a second dose of 40 mg if needed for swelling  10/14/2017 at Unknown time  . guaiFENesin (MUCINEX) 600 MG 12 hr tablet Take 600 mg by mouth 2 (two) times daily as needed for cough or to loosen phlegm.    Unk at [employer]  . HYDROcodone-acetaminophen (NORCO/VICODIN) 5-325 MG tablet Take 0.5-1 tablets by mouth 3 (three) times daily as needed (for pain).    Unk at [facility]  . latanoprost (XALATAN) 0.005 % ophthalmic solution Place 1 drop into both eyes at bedtime.  3 10/13/2017 at pm  . meclizine (ANTIVERT) 25 MG tablet Take 12.5 mg by mouth 3 (three) times daily as needed for dizziness.   0 Unk at [facility]  . potassium chloride (K-DUR) 10 MEQ tablet Take 10 mEq by mouth daily.   10/14/2017 at Unknown time  . predniSONE (DELTASONE) 10 MG tablet Take 10  mg by mouth daily with breakfast.   10/14/2017 at Unknown time  . Respiratory Therapy Supplies (FLUTTER) DEVI Use as directed. 1 each 0 UNK at [facility]    Assessment: 84 y/o female on apixaban PTA for Afib with an acute L1 burst fracture, subacute L4 fracture, and chronic T12 fracture. Kyphoplasty is being considered. Pharmacy consulted to transition to IV heparin. Last dose of apixaban was this morning at 09:30. No bleeding noted, CBC is normal.  Apixaban affects the heparin level lab so will use aPTT until heparin level and aPTT correlate.  Goal of Therapy:  Heparin level 0.3-0.7 units/ml aPTT 66-102 seconds Monitor platelets by anticoagulation protocol: Yes   Plan:  Begin heparin drip at 1050 units/hr at 22:00 tonight Baseline aPTT and heparin level 8 hr aPTT Daily heparin level, aPTT, and CBC Monitor for s/sx of bleeding   Renold Genta, PharmD, BCPS Clinical Pharmacist Clinical phone for 10/16/2017 until 10p is [phone] 10/16/2017 5:01 PM

## 2017-10-17 DIAGNOSIS — I10 Essential (primary) hypertension: Secondary | ICD-10-CM

## 2017-10-17 LAB — APTT: APTT: 76 s — AB (ref 24–36)

## 2017-10-17 LAB — BASIC METABOLIC PANEL
Anion gap: 8 (ref 5–15)
BUN: 31 mg/dL — AB (ref 6–20)
CALCIUM: 8.9 mg/dL (ref 8.9–10.3)
CO2: 28 mmol/L (ref 22–32)
Chloride: 104 mmol/L (ref 101–111)
Creatinine, Ser: 2.22 mg/dL — ABNORMAL HIGH (ref 0.44–1.00)
GFR calc Af Amer: 22 mL/min — ABNORMAL LOW (ref >60)
GFR calc non Af Amer: 19 mL/min — ABNORMAL LOW (ref >60)
GLUCOSE: 106 mg/dL — AB (ref 65–99)
Potassium: 3.7 mmol/L (ref 3.5–5.1)
SODIUM: 140 mmol/L (ref 135–145)

## 2017-10-17 LAB — CBC
HCT: 50.8 % — ABNORMAL HIGH (ref 36.0–46.0)
Hemoglobin: 15.9 g/dL — ABNORMAL HIGH (ref 12.0–15.0)
MCH: 29.8 pg (ref 26.0–34.0)
MCHC: 31.3 g/dL (ref 30.0–36.0)
MCV: 95.1 fL (ref 78.0–100.0)
PLATELETS: 163 10*3/uL (ref 150–400)
RBC: 5.34 MIL/uL — ABNORMAL HIGH (ref 3.87–5.11)
RDW: 16 % — AB (ref 11.5–15.5)
WBC: 9.6 10*3/uL (ref 4.0–10.5)

## 2017-10-17 LAB — PROTIME-INR
INR: 1.4
PROTHROMBIN TIME: 17 s — AB (ref 11.4–15.2)

## 2017-10-17 MED ORDER — METRONIDAZOLE 500 MG PO TABS
500.0000 mg | ORAL_TABLET | Freq: Three times a day (TID) | ORAL | Status: AC
  Administered 2017-10-17 – 2017-10-20 (×10): 500 mg via ORAL
  Filled 2017-10-17 (×12): qty 1

## 2017-10-17 MED ORDER — OXYCODONE HCL 5 MG PO TABS
5.0000 mg | ORAL_TABLET | Freq: Four times a day (QID) | ORAL | Status: DC | PRN
  Administered 2017-10-17 – 2017-10-23 (×7): 5 mg via ORAL
  Filled 2017-10-17 (×7): qty 1

## 2017-10-17 MED ORDER — POLYETHYLENE GLYCOL 3350 17 G PO PACK
17.0000 g | PACK | Freq: Every day | ORAL | Status: DC
  Administered 2017-10-18 – 2017-10-23 (×4): 17 g via ORAL
  Filled 2017-10-17 (×5): qty 1

## 2017-10-17 NOTE — Progress Notes (Addendum)
Attending MD note  Patient was seen, examined,treatment plan was discussed with the NP.  I have personally reviewed the clinical findings, lab, imaging studies and management of this patient in detail. I agree with the documentation, as recorded by the NP.   Subjective: Continues to have severe low back pain-8/10  On Exam: Vitals:   10/16/17 1618 10/16/17 2112 10/17/17 0627 10/17/17 0825  BP: 140/74 125/68 (!) 142/69   Pulse: 68 70 63   Resp: 18 17 17    Temp: 98 F (36.7 C) 97.6 F (36.4 C) 97.7 F (36.5 C)   TempSrc: Axillary  Oral   SpO2: 99% 98% 96% 95%  Weight:      Height:       Gen. exam: Awake, alert, not in any distress Chest: Good air entry bilaterally, no rhonchi or rales CVS: S1-S2 regular, no murmurs Abdomen: Soft, nontender and nondistended Neurology: Has adequate lower extremity strength-5/5, sensation is grossly intact.   Skin: No rash or lesions   Lab Data: CBC: Recent Labs  Lab 10/15/17 1055 10/16/17 0718 10/17/17 0602  WBC 16.2* 11.1* 9.6  HGB 17.6* 15.1* 15.9*  HCT 54.7* 47.4* 50.8*  MCV 93.0 95.0 95.1  PLT 223 161 003    Basic Metabolic Panel: Recent Labs  Lab 10/15/17 1055 10/16/17 0718 10/17/17 0602  NA 136 136 140  K 4.8 5.5* 3.7  CL 95* 97* 104  CO2 24 27 28   GLUCOSE 124* 88 106*  BUN 37* 35* 31*  CREATININE 2.67* 2.43* 2.22*  CALCIUM 10.0 9.3 8.9   Assessment and plan: Intractable low back pain secondary to acute L1 compression fracture: Pain continues to be intractable-continue with scheduled Tylenol, as needed oxycodone for moderate pain and Dilaudid for severe pain.  Have added nasal calcitonin for pain as well.  Appreciate Dr. Rolena Infante input-have consulted IR for kyphoplasty.  Her last dose of Eliquis was on 6/6-did speak with Dr. Sabas Sous over the phone yesterday-patient will need to be off Eliquis for at least 48 hours before proceeding with a kyphoplasty.  Currently on IV heparin.  Acute diverticulitis: Improved-minimal left  lower quadrant pain today-continue Rocephin/Flagyl.  Acute kidney injury on chronic kidney disease stage IV: Likely hemodynamically mediated-creatinine improving with supportive care.  Avoid nephrotoxic agents.  Atrial fibrillation: Rate controlled with amiodarone-off Eliquis-since her chads2Va score is around 5-aware starting IV heparin.  Chronic prednisone use: Patient not sure why she is on 10 mg of prednisone-however this is being continued  Rest as below  [facility]       PROGRESS NOTE    Heather Benson  KJZ:[id number] DOB: 24-Oct-1931 DOA: 10/15/2017 PCP: [name], MD   Brief Narrative: Patient is a 84 year old female with prior history of A. fib on Eliquis, hypertension, dyslipidemia, chronic kidney disease stage IV, recent herpes zoster involving her left flank area presented with worsening back pain for several weeks, a CT scan of the abdomen showed L1 compression fracture, it also showed probable left-sided diverticulitis.  She was subsequently started on empiric antimicrobial therapy and admitted to the hospitalist service. Of note she was seen by her orthopedic surgeon for the back pain 4 weeks ago and received a steroid injection.  Dr. Rolena Infante [facility] orthopedics)was consulted, patient underwent a lumbar CT scan on 6/6-which showed a burst L1 fracture-recommendations from orthopedics are to proceed with a kyphoplasty.  Unfortunately patient's last dose of Eliquis was morning of 6/6-and will need to be off Eliquis for 48 hours before hypoplasty  can be done.  Interventional radiology has been consulted.  See below for further details  Assessment & Plan: Low back pain in the setting of recent shingles and history L1 and L4 fracture: Lumbar CT with acute L1 burst fracture, consult called to both Dr. Rolena Infante with [facility] and Dr. Sabas Sous regarding surgical interventions. Due to Eliquis use for chronic A-fib surgery can't take place  until 48 hours after last dose, most likely will be early next week before Kyphoplasty will be preformed. Eliquis stopped and IV heparin drip started.  Pain remains intractable-continue scheduled Tylenol, and as needed oxycodone for moderate pain and Dilaudid for severe pain.  Await further input from internal interventional radiology.    Recent herpes zoster left flank area: Lesion have crusted over, continue low dose Neurontin for additional pain management and continue to monior for secondary infcetions  Acute diverticulitis: Incidentally found on renal CT scan-but patient does have mild left lower quadrant pain that has actually improved over the past few days. Continue Rocephin and Flagyl and other supportive care  Acute on Chronic CKD stage 4: Likely hemodynamically mediated, creatinine improving and close to usual baseline-continue IV fluids-avoid nephrotoxic agents.  Continue to hold Lasix for now.   Hyperkalemia: Potassium level 3.4 this am with know CKD stage 4, treated with Veltassa yesterday for potassium 5.5, continue to monitor labs daily   Atrial Fibrillation: Rate remains controlled on Pacerone, Eliquis on hod for possible surgery-since her CHADS2VASC score is around 5-she has been started on IV Heparin while off Eliquis  Hyperlipidemia: Continue home statin   Hypertension: Controlled, continue to hold home Lasix, PRN Hydralazine as needed   ?Chronic steroid use: On 10 mg of prednisone in the outpatient setting-not sure why she is on low dose steroids, spoke with both patient and spouse at bedside today and unable to atriculate why she is taking this. Will try to look back though records. Suspect this may be for severe osteoarthritis.   DVT prophylaxis: Heparin drip  Code Status: DNR Family Communication: No family at bedside during morning assessment  Disposition Plan: Remain inpatient-will require a few mores days of treatment before medically ready for discharge    Consultants:   Romeo   Interventional Radiology   Procedures:   None   Antimicrobials:  Anti-infectives (From admission, onward)   Start     Dose/Rate Route Frequency Ordered Stop   10/17/17 0845  metroNIDAZOLE (FLAGYL) tablet 500 mg     500 mg Oral Every 8 hours 10/17/17 0833     10/16/17 1700  ciprofloxacin (CIPRO) IVPB 400 mg  Status:  Discontinued     400 mg 200 mL/hr over 60 Minutes Intravenous Every 24 hours 10/15/17 1723 10/16/17 0849   10/16/17 1000  cefTRIAXone (ROCEPHIN) 1 g in sodium chloride 0.9 % 100 mL IVPB     1 g 200 mL/hr over 30 Minutes Intravenous Every 24 hours 10/16/17 0850     10/16/17 0200  metroNIDAZOLE (FLAGYL) IVPB 500 mg  Status:  Discontinued     500 mg 100 mL/hr over 60 Minutes Intravenous Every 8 hours 10/15/17 1704 10/17/17 0832   10/15/17 1545  ciprofloxacin (CIPRO) IVPB 400 mg     400 mg 200 mL/hr over 60 Minutes Intravenous  Once 10/15/17 1534 10/15/17 1758   10/15/17 1545  metroNIDAZOLE (FLAGYL) IVPB 500 mg     500 mg 100 mL/hr over 60 Minutes Intravenous  Once 10/15/17 1534 10/15/17 2000  Subjective: Patient resting in bed with continued complaints of back pain but states RN pain medication improves her pain.   Objective: Vitals:   10/16/17 1618 10/16/17 2112 10/17/17 0627 10/17/17 0825  BP: 140/74 125/68 (!) 142/69   Pulse: 68 70 63   Resp: 18 17 17    Temp: 98 F (36.7 C) 97.6 F (36.4 C) 97.7 F (36.5 C)   TempSrc: Axillary  Oral   SpO2: 99% 98% 96% 95%  Weight:      Height:        Intake/Output Summary (Last 24 hours) at 10/17/2017 0903 Last data filed at 10/17/2017 0500 Gross per 24 hour  Intake 1669.33 ml  Output 550 ml  Net 1119.33 ml   Filed Weights   10/15/17 1711 10/15/17 1733 10/16/17 0500  Weight: 74.8 kg (165 lb) 78.4 kg (172 lb 13.5 oz) 79.5 kg (175 lb 4.3 oz)    Examination: General exam: Appears calm and comfortable  Respiratory system: Clear to auscultation. Respiratory  effort normal. Cardiovascular system: S1 & S2 heard, RRR. No JVD, murmurs, rubs, gallops or clicks. No pedal edema. Gastrointestinal system: Abdomen is nondistended, soft and nontender. No organomegaly or masses felt. Normal bowel sounds heard. Herpes zoster crusted over lesion seen across lower abdomin  Central nervous system: Alert and oriented. No focal neurological deficits. Extremities: Symmetric 5 x 5 power. Skin: No rashes, lesions or ulcers Psychiatry: Judgement and insight appear normal. Mood & affect appropriate.   Data Reviewed: I have personally reviewed following labs and imaging studies  CBC: Recent Labs  Lab 10/15/17 1055 10/16/17 0718 10/17/17 0602  WBC 16.2* 11.1* 9.6  HGB 17.6* 15.1* 15.9*  HCT 54.7* 47.4* 50.8*  MCV 93.0 95.0 95.1  PLT 223 161 628   Basic Metabolic Panel: Recent Labs  Lab 10/15/17 1055 10/16/17 0718 10/17/17 0602  NA 136 136 140  K 4.8 5.5* 3.7  CL 95* 97* 104  CO2 24 27 28   GLUCOSE 124* 88 106*  BUN 37* 35* 31*  CREATININE 2.67* 2.43* 2.22*  CALCIUM 10.0 9.3 8.9   GFR: Estimated Creatinine Clearance: 20.4 mL/min (A) (by C-G formula based on SCr of 2.22 mg/dL (H)). Liver Function Tests: No results for input(s): AST, ALT, ALKPHOS, BILITOT, PROT, ALBUMIN in the last 168 hours. No results for input(s): LIPASE, AMYLASE in the last 168 hours. No results for input(s): AMMONIA in the last 168 hours. Coagulation Profile: Recent Labs  Lab 10/17/17 0602  INR 1.40   Cardiac Enzymes: No results for input(s): CKTOTAL, CKMB, CKMBINDEX, TROPONINI in the last 168 hours. BNP (last 3 results) No results for input(s): PROBNP in the last 8760 hours. HbA1C: No results for input(s): HGBA1C in the last 72 hours. CBG: No results for input(s): GLUCAP in the last 168 hours. Lipid Profile: No results for input(s): CHOL, HDL, LDLCALC, TRIG, CHOLHDL, LDLDIRECT in the last 72 hours. Thyroid Function Tests: No results for input(s): TSH, T4TOTAL,  FREET4, T3FREE, THYROIDAB in the last 72 hours. Anemia Panel: No results for input(s): VITAMINB12, FOLATE, FERRITIN, TIBC, IRON, RETICCTPCT in the last 72 hours. Urine analysis:    Component Value Date/Time   COLORURINE YELLOW 10/16/2017 2117   APPEARANCEUR CLEAR 10/16/2017 2117   APPEARANCEUR Clear 07/17/2017 1354   LABSPEC 1.011 10/16/2017 2117   PHURINE 5.0 10/16/2017 2117   GLUCOSEU NEGATIVE 10/16/2017 2117   HGBUR MODERATE (A) 10/16/2017 2117   BILIRUBINUR NEGATIVE 10/16/2017 2117   BILIRUBINUR Negative 07/17/2017 [place] 10/16/2017 2117  PROTEINUR NEGATIVE 10/16/2017 2117   UROBILINOGEN 0.2 02/05/2011 0915   NITRITE NEGATIVE 10/16/2017 2117   LEUKOCYTESUR NEGATIVE 10/16/2017 2117   LEUKOCYTESUR Negative 07/17/2017 1354   Sepsis Labs: @LABRCNTIP (procalcitonin:4,lacticidven:4)  )No results found for this or any previous visit (from the past 240 hour(s)).   Radiology Studies: Dg Chest 2 View  Result Date: 10/15/2017 CLINICAL DATA:  Hypoxia. EXAM: CHEST - 2 VIEW COMPARISON:  12/26/2015 FINDINGS: Cardiomediastinal silhouette is normal. Mediastinal contours appear intact. Calcific atherosclerotic disease and tortuosity of the aorta. There is no evidence of focal airspace consolidation, pleural effusion or pneumothorax. Osseous structures are without acute abnormality. Soft tissues are grossly normal. IMPRESSION: No active cardiopulmonary disease. Calcific atherosclerotic disease and tortuosity of the aorta. Electronically Signed   By: Fidela Salisbury M.D.   On: 10/15/2017 14:36   Ct Lumbar Spine Wo Contrast  Result Date: 10/16/2017 CLINICAL DATA:  Back pain corresponding to shingles for 2 weeks. No injury. History of melanoma. EXAM: CT LUMBAR SPINE WITHOUT CONTRAST TECHNIQUE: Multidetector CT imaging of the lumbar spine was performed without intravenous contrast administration. Multiplanar CT image reconstructions were also generated. COMPARISON:  CT abdomen and  pelvis October 15, 2017 FINDINGS: SEGMENTATION: For the purposes of this report the last well-formed intervertebral disc space is reported as L5-S1. ALIGNMENT: Maintained lumbar lordosis. No malalignment. VERTEBRAE: Acute L1 burst fracture, 12 mm in craniocaudad dimension, 15 mm in craniocaudad dimension yesterday. 4 mm L1 retropulsed bony fragments. Subacute appearing L4 compression fracture with approximately 50% height loss, stable from yesterday. Or mm L4 retropulsed bony fragments. Old moderate T12 compression fracture. Remaining lumbar vertebral bodies intact moderate-to-severe L5-S1 disc height loss with vacuum disc and endplate spurring compatible with degenerative disc. Remaining disc heights maintained. Osteopenia without destructive bony lesions. PARASPINAL AND OTHER SOFT TISSUES: Moderate paraspinal muscle atrophy. Severe calcific atherosclerosis aorta which measures up to 2 cm. Numerous complex renal cysts, incompletely evaluated with small nonobstructing left nephrolithiasis. DISC LEVELS: T11-12: Endplate spurring directed towards the right. No canal stenosis. Mild right neural foraminal narrowing. T12-L1: No disc bulge, canal stenosis nor neural foraminal narrowing. L1-2: Mild canal stenosis at L1 due to retropulsed bony fragments with narrowed lateral recesses which may affect the traversing L1 nurse. No neural foraminal narrowing. L2-3: No disc bulge, canal stenosis nor neural foraminal narrowing. L3-4: Retropulsed bony fragments, mild facet arthropathy. No canal stenosis or neural foraminal narrowing. L4-5: Small broad-based right subarticular disc osteophyte complex. Mild facet arthropathy. No canal stenosis. Mild bilateral neural foraminal narrowing. L5-S1: Right extraforaminal small disc osteophyte complex. No canal stenosis. Mild right neural foraminal narrowing and encroachment upon the exiting right L5 nerve. IMPRESSION: 1. Acute L1 burst fracture with 20% further height loss from yesterday's CT  with further retropulsion. Mild canal stenosis at L1. 2. Subacute appearing moderate L4 burst fracture with similar retropulsed bony fragments. Old moderate T12 compression fracture. 3. Mild neural foraminal narrowing T11-12, L4-5 and L5-S1. 4. These results will be called to the ordering clinician or representative by the Radiologist Assistant, and communication documented in the PACS or zVision Dashboard. Aortic Atherosclerosis (ICD10-I70.0). Electronically Signed   By: Elon Alas M.D.   On: 10/16/2017 14:05   Ct Renal Stone Study  Result Date: 10/15/2017 CLINICAL DATA:  Flank pain, stone disease suspected. Shingles. Symptoms for 1 week. Symptoms are worse today. EXAM: CT ABDOMEN AND PELVIS WITHOUT CONTRAST TECHNIQUE: Multidetector CT imaging of the abdomen and pelvis was performed following the standard protocol without IV contrast. COMPARISON:  CT of the abdomen  and pelvis 12/24/2016. FINDINGS: Lower chest: Mild dependent atelectasis is present at both lung bases, right greater than left. No significant consolidation is present. There is no nodule or mass lesion. Coronary artery calcifications are present. Extensive mitral annular calcifications are present. No significant pleural or pericardial effusion is present. Hepatobiliary: No focal liver abnormality is seen. No gallstones, gallbladder wall thickening, or biliary dilatation. Pancreas: Unremarkable. No pancreatic ductal dilatation or surrounding inflammatory changes. Spleen: The spleen is within normal limits. Adrenals/Urinary Tract: Adrenals are within normal limits. Multiple exophytic lesions are again seen bilaterally. Right-sided lesions measure water density. 2 upper pole lesions posteriorly on the left measure slightly higher density. The larger measures 2.1 cm, decreased since the prior exam. Ureters are within normal limits. The urinary bladder is unremarkable. No stones are present. Stomach/Bowel: The stomach and duodenum are within  normal limits for the small bowel is unremarkable. Terminal ileum is within normal limits. The appendix is visualized and normal. Ascending colon is normal transverse colon and descending colon are within normal limits. Diverticular changes are present within the sigmoid colon. There is wall thickening with minimal stranding. No fluid collection or free air is present. The rectum is within normal limits. Vascular/Lymphatic: Atherosclerotic calcifications are present in the aorta and branch vessels without aneurysm. No significant adenopathy is present. Reproductive: Status post hysterectomy. No adnexal masses. Other: No abdominal wall hernia or abnormality. No abdominopelvic ascites. Musculoskeletal: L1 comminuted vertebral body fracture is present. There is sparing of the posterior elements. Fracture involves predominantly the inferior endplate. A remote superior endplate fracture at W29 is stable. Acute/subacute superior endplate fractures present at L4. Bone fragments are retropulsed 5 mm into the canal. Vertebral body heights at L2, L3, and L5 are preserved. IMPRESSION: 1. Diverticular disease with wall thickening and slight stranding in the sigmoid colon discerning for developing acute diverticulitis. 2. Acute/subacute vertebral body fractures at L1 and L4. Retropulsed bone at L4 extends 5 mm into the canal. 3.  Aortic Atherosclerosis (ICD10-I70.0).  No aneurysm is present. 4. Multiple bilateral exophytic lesions of both kidneys. Majority of these lesions are cystic. Some increase in size. These are incompletely evaluated without IV contrast. Given the interval growth, MRI of the abdomen without and with contrast is recommended. Electronically Signed   By: San Morelle M.D.   On: 10/15/2017 15:01        Scheduled Meds: . acetaminophen  1,000 mg Oral Q6H  . amiodarone  200 mg Oral Daily  . atorvastatin  10 mg Oral Daily  . budesonide (PULMICORT) nebulizer solution  0.25 mg Nebulization BID  .  calcitonin (salmon)  1 spray Alternating Nares Daily  . furosemide  20 mg Oral Daily  . gabapentin  100 mg Oral QHS  . latanoprost  1 drop Both Eyes QHS  . lidocaine  1 patch Transdermal Q24H  . metroNIDAZOLE  500 mg Oral Q8H  . pantoprazole  40 mg Oral Q0600  . polyethylene glycol  17 g Oral Daily  . predniSONE  10 mg Oral Q breakfast   Continuous Infusions: . sodium chloride 50 mL/hr at 10/15/17 1657  . cefTRIAXone (ROCEPHIN)  IV 1 g (10/17/17 0851)  . heparin 1,050 Units/hr (10/16/17 2121)     LOS: 2 days    Time spent:  25 minutes-Greater than 50% of this time was spent in counseling, explanation of diagnosis, planning of further management, and coordination of care.   Johnsie Cancel, NP [facility] Pager [phone]  If 7PM-7AM, please contact night-coverage  [email or web address] Password [email or web address] 10/17/2017, 9:03 AM

## 2017-10-17 NOTE — Evaluation (Signed)
Physical Therapy Evaluation Patient Details Name: DWAN FENNEL MRN: [id number] DOB: 20-Aug-1931 Today's Date: 10/17/2017   History of Present Illness  JALESIA LOUDENSLAGER is a 84 y.o. female with medical history significant for DJD, hypertension, GERD, CKD stage III, recent shingles, brought to the emergency department with worsening lower back pain and left-sided abdominal pain.  She was seen by her orthopedic surgeon for the back pain 4 weeks ago and received a steroid injection. Her pain is complicated by development of shingles and acute diverticulitis seen on renal CT.     Clinical Impression  Pt admitted with above diagnosis. Pt currently with functional limitations due to the deficits listed below (see PT Problem List). PTA, pt living with husband, mod independent with [facility] for limited community ambulation. Upon eval pt presents with high levels of lumbar pain that limits her participation in therapy today. Focused on log roll technique, and bed level therex. Unable to mobilize OOB this visit. Current rec is SNF however patient may progress well after procedure next week, will cont to follow and update recs if appropriate.  Pt will benefit from skilled PT to increase their independence and safety with mobility to allow discharge to the venue listed below.       Follow Up Recommendations SNF;Supervision/Assistance - 24 hour(Patient may progress to HHPT after procedure)    Equipment Recommendations  (TBD)    Recommendations for Other Services       Precautions / Restrictions Precautions Precautions: Fall Restrictions Weight Bearing Restrictions: No      Mobility  Bed Mobility Overal bed mobility: Needs Assistance Bed Mobility: Rolling;Sidelying to Sit Rolling: Min assist Sidelying to sit: (Unable due to pain)       General bed mobility comments: pt unable to come to sitting via log roll due to pain today  Transfers                 General transfer comment: defered  2/2 pain  Ambulation/Gait                Stairs            Wheelchair Mobility    Modified Rankin (Stroke Patients Only)       Balance Overall balance assessment: (N/T)                                           Pertinent Vitals/Pain Pain Assessment: 0-10 Pain Score: 7  Pain Location: lower back shoots into legs with any movement Pain Descriptors / Indicators: Grimacing;Sharp;Shooting Pain Intervention(s): Limited activity within patient's tolerance;Monitored during session;Premedicated before session;Repositioned    Home Living Family/patient expects to be discharged to:: Private residence Living Arrangements: Spouse/significant other Available Help at Discharge: Family Type of Home: House Home Access: Level entry     [place]: One [place]: Environmental consultant - 2 wheels;Cane - single point;Hand held shower head;Grab bars - tub/shower;Shower seat - built in Additional Comments: use SPC for stability when she remembered (reports frequently forgetting to use it)    Prior Function Level of Independence: Needs assistance   Gait / Transfers Assistance Needed: modified independent with intermittent use of SPC for stability   ADL's / Homemaking Assistance Needed: husband assisted with ted hoes, and laundry; daughter assisted with cleaning; otherwise Independent with ADLs        Hand Dominance   Dominant Hand: Right  Extremity/Trunk Assessment   Upper Extremity Assessment Upper Extremity Assessment: Defer to OT evaluation    Lower Extremity Assessment Lower Extremity Assessment: (strength grossly 4-/5, limited by pain ) RLE Sensation: WNL       Communication   Communication: No difficulties  Cognition Arousal/Alertness: Awake/alert Behavior During Therapy: Anxious Overall Cognitive Status: Within Functional Limits for tasks assessed                                        General Comments       Exercises General Exercises - Lower Extremity Ankle Circles/Pumps: 20 reps;Both Quad Sets: 10 reps;Both Heel Slides: 10 reps;Both Hip ABduction/ADduction: 10 reps;Both   Assessment/Plan    PT Assessment Patient needs continued PT services  PT Problem List Decreased strength;Decreased range of motion;Decreased activity tolerance;Pain       PT Treatment Interventions DME instruction;Gait training;Stair training;Functional mobility training;Therapeutic activities;Therapeutic exercise    PT Goals (Current goals can be found in the Care Plan section)  Acute Rehab PT Goals Patient Stated Goal: to get better PT Goal Formulation: With patient Time For Goal Achievement: 10/31/17 Potential to Achieve Goals: Good    Frequency Min 3X/week   Barriers to discharge        Co-evaluation               AM-PAC PT "6 Clicks" Daily Activity  Outcome Measure Difficulty turning over in bed (including adjusting bedclothes, sheets and blankets)?: Unable Difficulty moving from lying on back to sitting on the side of the bed? : Unable Difficulty sitting down on and standing up from a chair with arms (e.g., wheelchair, bedside commode, etc,.)?: Unable Help needed moving to and from a bed to chair (including a wheelchair)?: Total Help needed walking in hospital room?: Total Help needed climbing 3-5 steps with a railing? : Total 6 Click Score: 6    End of Session   Activity Tolerance: Patient limited by pain Patient left: in bed;with call bell/phone within reach Nurse Communication: Mobility status PT Visit Diagnosis: Unsteadiness on feet (R26.81);Pain;Muscle weakness (generalized) (M62.81);Difficulty in walking, not elsewhere classified (R26.2) Pain - part of body: (Lumbar)    Time: 7616-0737 PT Time Calculation (min) (ACUTE ONLY): 25 min   Charges:   PT Evaluation $PT Eval Low Complexity: 1 Low PT Treatments $Therapeutic Exercise: 8-22 mins   PT G Codes:        Reinaldo Berber,  PT, DPT Acute Rehab Services Pager: [phone]    Reinaldo Berber 10/17/2017, 3:36 PM

## 2017-10-17 NOTE — Consult Note (Signed)
[facility] CM Primary Care Navigator  10/17/2017  Heather Benson 1931-08-11 [id number]   Met withpatientandhusband (Jack)at the bedside to identify possible discharge needs. Patientreportshaving "increased, worsening lower back pain" that resultedtothis admission and eventually surgery. (Intractable low back pain secondary to acute L1 compression- for kyphoplasty; shingles).  Patientendorses Dr. [name] with [facility] astheprimary care provider.    [facility].  Patienthas beenmanagingherownmedications at homeusing "pill box" system filled once a week.  Patient reports that husband has been providing transportationto herdoctors' appointments.  Patientliveswithhusband whoservesas theprimary caregiverat home as stated.  Anticipateddischargeplan isskilled nursing facility (SNF) per therapy recommendation, post surgical intervention.  Patient andhusbandvoiced understanding to call primary care provider's office whenshereturnsbackhome, for a post discharge follow-upvisit within1- 2weeksor sooner if needs arise. Patient letter (with PCP's contact number) was provided asareminder.   Discussed with patientandhusbandregarding [facility] CM services available for health management/ resources at home but states that she has been managing her health issues with husband's assistance so far. Patientand husband expressed understandingof needto seek referral from primary care provider to [facility] care management if deemed necessary and appropriate for services in thenearfuture.   [facility] care management information was provided for future needs thatshemay have.  Primary care provider's officeis listed as providing transition of care (TOC) follow-up.   For additional questions please contact:  Edwena Felty A. Avaiyah Strubel, BSN,  RN-BC [facility] PRIMARY CARE Navigator Cell: [phone]

## 2017-10-17 NOTE — Progress Notes (Signed)
[place] for Heparin Indication: atrial fibrillation  Allergies  Allergen Reactions  . Augmentin [Amoxicillin-Pot Clavulanate] Diarrhea and Nausea Only  . Boniva [Ibandronic Acid] Diarrhea  . Codeine Other (See Comments)    "Spaced out feeling" and Lightheadedness  . Hydromorphone Nausea And Vomiting  . Latex Rash    "Burns me"  . Lisinopril Cough  . Adhesive [Tape] Itching and Rash    Patient Measurements: Height: 5\' 6"  (167.6 cm) Weight: 175 lb 4.3 oz (79.5 kg) IBW/kg (Calculated) : 59.3 Heparin Dosing Weight: 75 kg  Vital Signs: Temp: 97.7 F (36.5 C) (06/07 0627) Temp Source: Oral (06/07 0627) BP: 142/69 (06/07 0627) Pulse Rate: 63 (06/07 0627)  Labs: Recent Labs    10/15/17 1055 10/16/17 0718 10/16/17 1947 10/17/17 0602  HGB 17.6* 15.1*  --  15.9*  HCT 54.7* 47.4*  --  50.8*  PLT 223 161  --  163  APTT  --   --  30 76*  LABPROT  --   --   --  17.0*  INR  --   --   --  1.40  HEPARINUNFRC  --   --  >2.20*  --   CREATININE 2.67* 2.43*  --  2.22*    Estimated Creatinine Clearance: 20.4 mL/min (A) (by C-G formula based on SCr of 2.22 mg/dL (H)).   Assessment: 84 y/o female on apixaban PTA for Afib with an acute L1 burst fracture, subacute L4 fracture, and chronic T12 fracture. Kyphoplasty is being considered. Pharmacy consulted to transition to IV heparin. Last dose of apixaban was this morning at 09:30. No bleeding noted, CBC is normal.  Apixaban affects the heparin level lab so will use aPTT until heparin level and aPTT correlate.  PTT therapeutic this AM at 76 seconds CBC stable  Goal of Therapy:  Heparin level 0.3-0.7 units/ml aPTT 66-102 seconds Monitor platelets by anticoagulation protocol: Yes   Plan:  Cont heparin at 1050 units/hr Daily heparin level, PTT, CBC  Thank you Anette Guarneri, PharmD [phone]

## 2017-10-17 NOTE — Care Management Important Message (Signed)
Important Message  Patient Details  Name: Heather Benson MRN: [id number] Date of Birth: 1932-02-01   Medicare Important Message Given:  Yes    Orbie Pyo 10/17/2017, 4:05 PM

## 2017-10-17 NOTE — Progress Notes (Signed)
ANTICOAGULATION CONSULT NOTE - Initial Consult  Pharmacy Consult for Heparin Indication: atrial fibrillation  Allergies  Allergen Reactions  . Augmentin [Amoxicillin-Pot Clavulanate] Diarrhea and Nausea Only  . Boniva [Ibandronic Acid] Diarrhea  . Codeine Other (See Comments)    "Spaced out feeling" and Lightheadedness  . Hydromorphone Nausea And Vomiting  . Latex Rash    "Burns me"  . Lisinopril Cough  . Adhesive [Tape] Itching and Rash    Patient Measurements: Height: 5\' 6"  (167.6 cm) Weight: 175 lb 4.3 oz (79.5 kg) IBW/kg (Calculated) : 59.3 Heparin Dosing Weight: 75 kg  Vital Signs: Temp: 97.7 F (36.5 C) (06/07 0627) Temp Source: Oral (06/07 0627) BP: 142/69 (06/07 0627) Pulse Rate: 63 (06/07 0627)  Labs: Recent Labs    10/15/17 1055 10/16/17 0718 10/16/17 1947 10/17/17 0602  HGB 17.6* 15.1*  --  15.9*  HCT 54.7* 47.4*  --  50.8*  PLT 223 161  --  163  APTT  --   --  30 76*  LABPROT  --   --   --  17.0*  INR  --   --   --  1.40  HEPARINUNFRC  --   --  >2.20*  --   CREATININE 2.67* 2.43*  --   --     Estimated Creatinine Clearance: 18.7 mL/min (A) (by C-G formula based on SCr of 2.43 mg/dL (H)).   Medical History: Past Medical History:  Diagnosis Date  . Aortic atherosclerosis ([place]) 12/25/2016  . Aortic stenosis, mild 12/14/2015  . Arthritis 08-28-11   right knee pain-surgery planned  . Cancer [facility]) 08-28-11   Melonoma-cheek(many Yrs ago) , recent bx. left  face lesion, past skin cancer lesions  . CAP (community acquired pneumonia) 12/13/2015  . CHF (congestive heart failure) ([place])   . Complication of anesthesia 08-28-11   in past arrythmia-  cardiology has seen in past  . Dysrhythmia 08-28-11   only immed.  to several days after surgery- hx. A.fib. in past  . GERD (gastroesophageal reflux disease) 08-28-11   tx. omeprazole  . Hypercholesterolemia   . Hypertension 08-28-11   tx. meds  . IBS (irritable bowel syndrome)   . Lymphedema   . Neuromuscular  disorder ([place]) 08-28-11   hx. Polio age 22-slight residual affects legs  . Osteoarthritis    back and left knee  . Osteoporosis   . Pneumonia 12/2015   hospital x 3 days  . PONV (postoperative nausea and vomiting)   . Renal cyst, right   . Sarcoidosis   . Swelling of extremity 08-28-11   bilateral lower extremities- mid calf down    Medications:  Medications Prior to Admission  Medication Sig Dispense Refill Last Dose  . acetaminophen (TYLENOL) 500 MG tablet Take 1,000 mg by mouth every 8 (eight) hours as needed (for pain).    Unk at [facility]  . amiodarone (PACERONE) 200 MG tablet Take 1 tablet (200 mg total) by mouth daily. 30 tablet 3 10/14/2017 at Unknown time  . atorvastatin (LIPITOR) 10 MG tablet Take 1 tablet (10 mg total) by mouth daily. 90 tablet 3 10/14/2017 at Unknown time  . beclomethasone (QVAR) 80 MCG/ACT inhaler Inhale 2 puffs into the lungs 2 (two) times daily as needed (for flares).    Unk at [employer]  . ELIQUIS 2.5 MG TABS tablet TAKE 1 TABLET BY MOUTH TWICE A DAY 60 tablet 9 10/14/2017 at 2100  . fexofenadine (ALLEGRA) 180 MG tablet Take 180 mg by mouth daily as needed for allergies.  Unk at [facility]  . fluticasone (FLONASE) 50 MCG/ACT nasal spray Place 1 spray into both nostrils daily as needed for allergies.    Unk at [facility]  . furosemide (LASIX) 40 MG tablet Take 40 mg by mouth See admin instructions. Take 40 mg by mouth once a day and may take a second dose of 40 mg if needed for swelling   10/14/2017 at Unknown time  . guaiFENesin (MUCINEX) 600 MG 12 hr tablet Take 600 mg by mouth 2 (two) times daily as needed for cough or to loosen phlegm.    Unk at [employer] [facility]  . HYDROcodone-acetaminophen (NORCO/VICODIN) 5-325 MG tablet Take 0.5-1 tablets by mouth 3 (three) times daily as needed (for pain).    Unk at [facility]  . latanoprost (XALATAN) 0.005 % ophthalmic solution Place 1 drop into both eyes at bedtime.  3 10/13/2017 at pm  . meclizine (ANTIVERT) 25 MG tablet Take 12.5 mg by mouth 3 (three) times daily as  needed for dizziness.   0 Unk at [facility]  . potassium chloride (K-DUR) 10 MEQ tablet Take 10 mEq by mouth daily.   10/14/2017 at Unknown time  . predniSONE (DELTASONE) 10 MG tablet Take 10 mg by mouth daily with breakfast.   10/14/2017 at Unknown time  . Respiratory Therapy Supplies (FLUTTER) DEVI Use as directed. 1 each 0 UNK at [facility]    Assessment: 84 y/o female on apixaban PTA for Afib with an acute L1 burst fracture, subacute L4 fracture, and chronic T12 fracture. Kyphoplasty is being considered. Pharmacy consulted to transition to IV heparin. Last dose of apixaban was this morning at 09:30. No bleeding noted, CBC is normal.  Apixaban affects the heparin level lab so will use aPTT until heparin level and aPTT correlate.  6/7 AM update: aPTT is therapeutic x 1 this AM  Goal of Therapy:  Heparin level 0.3-0.7 units/ml aPTT 66-102 seconds Monitor platelets by anticoagulation protocol: Yes   Plan:  Cont heparin at 1050 units/hr 1200 aPTT  Narda Bonds, PharmD, BCPS Clinical Pharmacist Phone: [phone]

## 2017-10-18 ENCOUNTER — Inpatient Hospital Stay (HOSPITAL_COMMUNITY): Payer: Medicare Other

## 2017-10-18 LAB — CBC
HEMATOCRIT: 49.2 % — AB (ref 36.0–46.0)
Hemoglobin: 15.1 g/dL — ABNORMAL HIGH (ref 12.0–15.0)
MCH: 29.4 pg (ref 26.0–34.0)
MCHC: 30.7 g/dL (ref 30.0–36.0)
MCV: 95.9 fL (ref 78.0–100.0)
PLATELETS: 160 10*3/uL (ref 150–400)
RBC: 5.13 MIL/uL — ABNORMAL HIGH (ref 3.87–5.11)
RDW: 16 % — AB (ref 11.5–15.5)
WBC: 9.9 10*3/uL (ref 4.0–10.5)

## 2017-10-18 LAB — APTT: aPTT: 55 seconds — ABNORMAL HIGH (ref 24–36)

## 2017-10-18 LAB — HEPARIN LEVEL (UNFRACTIONATED): HEPARIN UNFRACTIONATED: 1.58 [IU]/mL — AB (ref 0.30–0.70)

## 2017-10-18 NOTE — Progress Notes (Addendum)
[place] for Heparin Indication: atrial fibrillation  Allergies  Allergen Reactions  . Augmentin [Amoxicillin-Pot Clavulanate] Diarrhea and Nausea Only  . Boniva [Ibandronic Acid] Diarrhea  . Codeine Other (See Comments)    "Spaced out feeling" and Lightheadedness  . Hydromorphone Nausea And Vomiting  . Latex Rash    "Burns me"  . Lisinopril Cough  . Adhesive [Tape] Itching and Rash    Patient Measurements: Height: 5\' 6"  (167.6 cm) Weight: 177 lb 4 oz (80.4 kg) IBW/kg (Calculated) : 59.3 Heparin Dosing Weight: 75 kg  Vital Signs: Temp: 98.6 F (37 C) (06/08 0614) Temp Source: Oral (06/08 0614) BP: 138/70 (06/08 0614) Pulse Rate: 72 (06/08 0614)  Labs: Recent Labs    10/16/17 0718 10/16/17 1947 10/17/17 0602 10/18/17 0432 10/18/17 0809  HGB 15.1*  --  15.9*  --  15.1*  HCT 47.4*  --  50.8*  --  49.2*  PLT 161  --  163  --  160  APTT  --  30 76*  --  >200*  LABPROT  --   --  17.0*  --   --   INR  --   --  1.40  --   --   HEPARINUNFRC  --  >2.20*  --  1.58*  --   CREATININE 2.43*  --  2.22*  --   --     Estimated Creatinine Clearance: 20.5 mL/min (A) (by C-G formula based on SCr of 2.22 mg/dL (H)).   Assessment: 84 y/o female on apixaban PTA for Afib with an acute L1 burst fracture, subacute L4 fracture, and chronic T12 fracture. Kyphoplasty is being considered. Pharmacy consulted to transition to IV heparin. Last dose of apixaban was this morning at 09:30. No bleeding noted, CBC is normal.  Apixaban affects the heparin level lab so will use aPTT until heparin level and aPTT correlate.  Heparin level remains falsely elevated, and aPTT supratherapeutic at >200, with stable CBC and no bleeding per RN.    Goal of Therapy:  Heparin level 0.3-0.7 units/ml aPTT 66-102 seconds Monitor platelets by anticoagulation protocol: Yes   Plan:  Hold heparin gtt for 1 hour, then resume at decreased rate of 750 units/hr - Plan  communicated with RN F/u 8h level to confirm Monitor daily heparin level, CBC, s/s bleeding  Bertis Ruddy, PharmD Pharmacy Resident [phone] 10/18/2017 11:09 AM

## 2017-10-18 NOTE — Progress Notes (Signed)
Attending MD note  Patient was seen, examined,treatment plan was discussed with the NP.  I have personally reviewed the clinical findings, lab, imaging studies and management of this patient in detail. I agree with the documentation, as recorded by the NP.   Subjective: Continues to have severe low back pain-8/10  On Exam: Vitals:   10/17/17 2004 10/17/17 2146 10/18/17 0614 10/18/17 0830  BP:  (!) 145/78 138/70   Pulse:  85 72   Resp:  18 17   Temp:  98.4 F (36.9 C) 98.6 F (37 C)   TempSrc:   Oral   SpO2: 98% 94% 95% 96%  Weight:   80.4 kg (177 lb 4 oz)   Height:       Gen. exam: Awake, alert, not in any distress Chest: Good air entry bilaterally, no rhonchi or rales CVS: S1-S2 regular, no murmurs Abdomen: Soft, nontender and nondistended Neurology: Has adequate lower extremity strength-5/5, sensation is grossly intact.   Skin: No rash or lesions   Lab Data: CBC: Recent Labs  Lab 10/15/17 1055 10/16/17 0718 10/17/17 0602 10/18/17 0809  WBC 16.2* 11.1* 9.6 9.9  HGB 17.6* 15.1* 15.9* 15.1*  HCT 54.7* 47.4* 50.8* 49.2*  MCV 93.0 95.0 95.1 95.9  PLT 223 161 163 440    Basic Metabolic Panel: Recent Labs  Lab 10/15/17 1055 10/16/17 0718 10/17/17 0602  NA 136 136 140  K 4.8 5.5* 3.7  CL 95* 97* 104  CO2 24 27 28   GLUCOSE 124* 88 106*  BUN 37* 35* 31*  CREATININE 2.67* 2.43* 2.22*  CALCIUM 10.0 9.3 8.9   Assessment and plan: Intractable low back pain secondary to acute L1 compression fracture: Pain continues to be intractable-continue with scheduled Tylenol, as needed oxycodone for moderate pain and Dilaudid for severe pain.  Have added nasal calcitonin for pain as well.  Appreciate Dr. Rolena Infante input-have consulted IR for kyphoplasty.  Her last dose of Eliquis was on 6/6-did speak with Dr. Sabas Sous over the phone yesterday-patient will need to be off Eliquis for at least 48 hours before proceeding with a kyphoplasty.  Currently on IV heparin.  Acute  diverticulitis: Improved-minimal left lower quadrant pain today-continue Rocephin/Flagyl.  Acute kidney injury on chronic kidney disease stage IV: Likely hemodynamically mediated-creatinine improving with supportive care.  Avoid nephrotoxic agents.  Atrial fibrillation: Rate controlled with amiodarone-off Eliquis-since her chads2Va score is around 5-aware starting IV heparin.  Chronic prednisone use: Patient not sure why she is on 10 mg of prednisone-however this is being continued  Rest as below  [facility]       PROGRESS NOTE    Heather Benson  HKV:[id number] DOB: [birth year] DOA: 10/15/2017 PCP: [name], MD   Brief Narrative: Patient is a 84 year old female with prior history of A. fib on Eliquis, hypertension, dyslipidemia, chronic kidney disease stage IV, recent herpes zoster involving her left flank area presented with worsening back pain for several weeks, a CT scan of the abdomen showed L1 compression fracture, it also showed probable left-sided diverticulitis.  She was subsequently started on empiric antimicrobial therapy and admitted to the hospitalist service. Of note she was seen by her orthopedic surgeon for the back pain 4 weeks ago and received a steroid injection.  Dr. Rolena Infante [facility] orthopedics)was consulted, patient underwent a lumbar CT scan on 6/6-which showed a burst L1 fracture-recommendations from orthopedics are to proceed with a kyphoplasty.  Unfortunately patient's last dose of Eliquis was morning of 6/6-and will need  to be off Eliquis for 48 hours before hypoplasty can be done.  Interventional radiology has been consulted.  See below for further details  Subjective:  Patient in bed, appears comfortable, denies any headache, no fever, no chest pain or pressure, no shortness of breath , no abdominal pain. No focal weakness.  Continues to have low back pain which is better at rest.   Assessment & Plan:  Low back pain in the  setting of recent shingles and history L1 and L4 fracture: Lumbar CT shows acute L1 burst fracture, seen by orthopedics Dr. Rolena Infante who recommended IR consultation which was obtained through Dr. Estanislado Pandy, patient will require kyphoplasty.  Since she was on Eliquis she has been transitioned to heparin drip for bridging.  Last Eliquis dose was 10/16/2017.  Continue supportive care.  IR to evaluate and operate likely coming Monday.   Recent herpes zoster left flank area: Lesion have crusted over, continue low dose Neurontin for additional pain management and continue to monior for secondary infcetions  Acute diverticulitis: Incidentally found on renal CT scan-but patient does have mild left lower quadrant pain that has actually improved over the past few days. Continue Rocephin and Flagyl and other supportive care  Acute on Chronic CKD stage 4: Likely hemodynamically mediated, creatinine improving and close to usual baseline-continue IV fluids-avoid nephrotoxic agents.  Continue to hold Lasix for now.   Hyperkalemia: Potassium level 3.4 this am with know CKD stage 4, treated with Veltassa yesterday for potassium 5.5, continue to monitor labs daily   Atrial Fibrillation: Rate remains controlled on Pacerone, Eliquis on hod for possible surgery-since her CHADS2VASC score is around 5-she has been started on IV Heparin while off Eliquis  Hyperlipidemia: Continue home statin   Hypertension: Controlled, continue to hold home Lasix, PRN Hydralazine as needed   ?Chronic steroid use: On 10 mg of prednisone in the outpatient setting-patient unaware why likely due to arthritis, for now not under extreme stress and sleeve at this dose and follow with PCP for further evaluation and management.   DVT prophylaxis: Heparin drip  Code Status: DNR Family Communication: No family at bedside during morning assessment  Disposition Plan: Remain inpatient-will require a few mores days of treatment before medically  ready for discharge   Consultants:    Orthopedic-Dr. Rolena Infante   Interventional Radiology   Procedures:   None  . acetaminophen  1,000 mg Oral Q6H  . amiodarone  200 mg Oral Daily  . atorvastatin  10 mg Oral Daily  . budesonide (PULMICORT) nebulizer solution  0.25 mg Nebulization BID  . calcitonin (salmon)  1 spray Alternating Nares Daily  . furosemide  20 mg Oral Daily  . gabapentin  100 mg Oral QHS  . latanoprost  1 drop Both Eyes QHS  . lidocaine  1 patch Transdermal Q24H  . metroNIDAZOLE  500 mg Oral Q8H  . pantoprazole  40 mg Oral Q0600  . polyethylene glycol  17 g Oral Daily  . predniSONE  10 mg Oral Q breakfast   . sodium chloride 50 mL/hr at 10/15/17 1657  . cefTRIAXone (ROCEPHIN)  IV 1 g (10/18/17 0928)  . heparin 1,050 Units/hr (10/17/17 1728)    Anti-infectives (From admission, onward)   Start     Dose/Rate Route Frequency Ordered Stop   10/17/17 0845  metroNIDAZOLE (FLAGYL) tablet 500 mg     500 mg Oral Every 8 hours 10/17/17 0833     10/16/17 1700  ciprofloxacin (CIPRO) IVPB 400 mg  Status:  Discontinued  400 mg 200 mL/hr over 60 Minutes Intravenous Every 24 hours 10/15/17 1723 10/16/17 0849   10/16/17 1000  cefTRIAXone (ROCEPHIN) 1 g in sodium chloride 0.9 % 100 mL IVPB     1 g 200 mL/hr over 30 Minutes Intravenous Every 24 hours 10/16/17 0850     10/16/17 0200  metroNIDAZOLE (FLAGYL) IVPB 500 mg  Status:  Discontinued     500 mg 100 mL/hr over 60 Minutes Intravenous Every 8 hours 10/15/17 1704 10/17/17 0832   10/15/17 1545  ciprofloxacin (CIPRO) IVPB 400 mg     400 mg 200 mL/hr over 60 Minutes Intravenous  Once 10/15/17 1534 10/15/17 1758   10/15/17 1545  metroNIDAZOLE (FLAGYL) IVPB 500 mg     500 mg 100 mL/hr over 60 Minutes Intravenous  Once 10/15/17 1534 10/15/17 2000        Objective: Vitals:   10/17/17 2004 10/17/17 2146 10/18/17 0614 10/18/17 0830  BP:  (!) 145/78 138/70   Pulse:  85 72   Resp:  18 17   Temp:  98.4 F (36.9 C) 98.6  F (37 C)   TempSrc:   Oral   SpO2: 98% 94% 95% 96%  Weight:   80.4 kg (177 lb 4 oz)   Height:        Intake/Output Summary (Last 24 hours) at 10/18/2017 1035 Last data filed at 10/18/2017 7681 Gross per 24 hour  Intake 356.73 ml  Output 350 ml  Net 6.73 ml   Filed Weights   10/15/17 1733 10/16/17 0500 10/18/17 0614  Weight: 78.4 kg (172 lb 13.5 oz) 79.5 kg (175 lb 4.3 oz) 80.4 kg (177 lb 4 oz)    Examination:  Awake Alert, Oriented X 3, No new F.N deficits, Normal affect [place].AT,PERRAL Supple Neck,No JVD, No cervical lymphadenopathy appriciated.  Symmetrical Chest wall movement, Good air movement bilaterally, CTAB RRR,No Gallops, Rubs or new Murmurs, No Parasternal Heave +ve B.Sounds, Abd Soft, No tenderness, No organomegaly appriciated, No rebound - guarding or rigidity. No Cyanosis, Clubbing or edema, No new Rash or bruise    Data Reviewed: I have personally reviewed following labs and imaging studies  CBC: Recent Labs  Lab 10/15/17 1055 10/16/17 0718 10/17/17 0602 10/18/17 0809  WBC 16.2* 11.1* 9.6 9.9  HGB 17.6* 15.1* 15.9* 15.1*  HCT 54.7* 47.4* 50.8* 49.2*  MCV 93.0 95.0 95.1 95.9  PLT 223 161 163 157   Basic Metabolic Panel: Recent Labs  Lab 10/15/17 1055 10/16/17 0718 10/17/17 0602  NA 136 136 140  K 4.8 5.5* 3.7  CL 95* 97* 104  CO2 24 27 28   GLUCOSE 124* 88 106*  BUN 37* 35* 31*  CREATININE 2.67* 2.43* 2.22*  CALCIUM 10.0 9.3 8.9   GFR: Estimated Creatinine Clearance: 20.5 mL/min (A) (by C-G formula based on SCr of 2.22 mg/dL (H)). Liver Function Tests: No results for input(s): AST, ALT, ALKPHOS, BILITOT, PROT, ALBUMIN in the last 168 hours. No results for input(s): LIPASE, AMYLASE in the last 168 hours. No results for input(s): AMMONIA in the last 168 hours. Coagulation Profile: Recent Labs  Lab 10/17/17 0602  INR 1.40   Cardiac Enzymes: No results for input(s): CKTOTAL, CKMB, CKMBINDEX, TROPONINI in the last 168 hours. BNP (last 3  results) No results for input(s): PROBNP in the last 8760 hours. HbA1C: No results for input(s): HGBA1C in the last 72 hours. CBG: No results for input(s): GLUCAP in the last 168 hours. Lipid Profile: No results for input(s): CHOL, HDL, LDLCALC, TRIG, CHOLHDL,  LDLDIRECT in the last 72 hours. Thyroid Function Tests: No results for input(s): TSH, T4TOTAL, FREET4, T3FREE, THYROIDAB in the last 72 hours. Anemia Panel: No results for input(s): VITAMINB12, FOLATE, FERRITIN, TIBC, IRON, RETICCTPCT in the last 72 hours. Urine analysis:    Component Value Date/Time   COLORURINE YELLOW 10/16/2017 2117   APPEARANCEUR CLEAR 10/16/2017 2117   APPEARANCEUR Clear 07/17/2017 1354   LABSPEC 1.011 10/16/2017 2117   PHURINE 5.0 10/16/2017 2117   GLUCOSEU NEGATIVE 10/16/2017 2117   HGBUR MODERATE (A) 10/16/2017 2117   BILIRUBINUR NEGATIVE 10/16/2017 2117   BILIRUBINUR Negative 07/17/2017 1354   KETONESUR NEGATIVE 10/16/2017 2117   PROTEINUR NEGATIVE 10/16/2017 2117   UROBILINOGEN 0.2 02/05/2011 0915   NITRITE NEGATIVE 10/16/2017 2117   LEUKOCYTESUR NEGATIVE 10/16/2017 2117   LEUKOCYTESUR Negative 07/17/2017 1354   Sepsis Labs: @LABRCNTIP (procalcitonin:4,lacticidven:4)  )No results found for this or any previous visit (from the past 240 hour(s)).   Radiology Studies: Ct Lumbar Spine Wo Contrast  Result Date: 10/16/2017 CLINICAL DATA:  Back pain corresponding to shingles for 2 weeks. No injury. History of melanoma. EXAM: CT LUMBAR SPINE WITHOUT CONTRAST TECHNIQUE: Multidetector CT imaging of the lumbar spine was performed without intravenous contrast administration. Multiplanar CT image reconstructions were also generated. COMPARISON:  CT abdomen and pelvis October 15, 2017 FINDINGS: SEGMENTATION: For the purposes of this report the last well-formed intervertebral disc space is reported as L5-S1. ALIGNMENT: Maintained lumbar lordosis. No malalignment. VERTEBRAE: Acute L1 burst fracture, 12 mm in  craniocaudad dimension, 15 mm in craniocaudad dimension yesterday. 4 mm L1 retropulsed bony fragments. Subacute appearing L4 compression fracture with approximately 50% height loss, stable from yesterday. Or mm L4 retropulsed bony fragments. Old moderate T12 compression fracture. Remaining lumbar vertebral bodies intact moderate-to-severe L5-S1 disc height loss with vacuum disc and endplate spurring compatible with degenerative disc. Remaining disc heights maintained. Osteopenia without destructive bony lesions. PARASPINAL AND OTHER SOFT TISSUES: Moderate paraspinal muscle atrophy. Severe calcific atherosclerosis aorta which measures up to 2 cm. Numerous complex renal cysts, incompletely evaluated with small nonobstructing left nephrolithiasis. DISC LEVELS: T11-12: Endplate spurring directed towards the right. No canal stenosis. Mild right neural foraminal narrowing. T12-L1: No disc bulge, canal stenosis nor neural foraminal narrowing. L1-2: Mild canal stenosis at L1 due to retropulsed bony fragments with narrowed lateral recesses which may affect the traversing L1 nurse. No neural foraminal narrowing. L2-3: No disc bulge, canal stenosis nor neural foraminal narrowing. L3-4: Retropulsed bony fragments, mild facet arthropathy. No canal stenosis or neural foraminal narrowing. L4-5: Small broad-based right subarticular disc osteophyte complex. Mild facet arthropathy. No canal stenosis. Mild bilateral neural foraminal narrowing. L5-S1: Right extraforaminal small disc osteophyte complex. No canal stenosis. Mild right neural foraminal narrowing and encroachment upon the exiting right L5 nerve. IMPRESSION: 1. Acute L1 burst fracture with 20% further height loss from yesterday's CT with further retropulsion. Mild canal stenosis at L1. 2. Subacute appearing moderate L4 burst fracture with similar retropulsed bony fragments. Old moderate T12 compression fracture. 3. Mild neural foraminal narrowing T11-12, L4-5 and L5-S1. 4.  These results will be called to the ordering clinician or representative by the Radiologist Assistant, and communication documented in the PACS or zVision Dashboard. Aortic Atherosclerosis (ICD10-I70.0). Electronically Signed   By: Elon Alas M.D.   On: 10/16/2017 14:05     LOS: 3 days    Time spent:   30 minutes-Greater than 50% of this time was spent in counseling, explanation of diagnosis, planning of further management, and coordination of care.  Signature  Lala Lund M.D on 10/18/2017 at 10:35 AM  Between 7am to 7pm - Pager - [phone] ( page via [email or web address], text pages only, please mention full 10 digit call back number).  After 7pm go to [email or web address] - password [email or web address]

## 2017-10-18 NOTE — Progress Notes (Signed)
[place] for Heparin Indication: atrial fibrillation  Allergies  Allergen Reactions  . Augmentin [Amoxicillin-Pot Clavulanate] Diarrhea and Nausea Only  . Boniva [Ibandronic Acid] Diarrhea  . Codeine Other (See Comments)    "Spaced out feeling" and Lightheadedness  . Hydromorphone Nausea And Vomiting  . Latex Rash    "Burns me"  . Lisinopril Cough  . Adhesive [Tape] Itching and Rash    Patient Measurements: Height: 5\' 6"  (167.6 cm) Weight: 177 lb 4 oz (80.4 kg) IBW/kg (Calculated) : 59.3 Heparin Dosing Weight: 75 kg  Vital Signs: Temp: 98.2 F (36.8 C) (06/08 1755) Temp Source: Oral (06/08 1755) BP: 116/68 (06/08 1755) Pulse Rate: 70 (06/08 2036)  Labs: Recent Labs    10/16/17 6283  10/16/17 1947 10/17/17 0602 10/18/17 0432 10/18/17 0809 10/18/17 1938  HGB 15.1*  --   --  15.9*  --  15.1*  --   HCT 47.4*  --   --  50.8*  --  49.2*  --   PLT 161  --   --  163  --  160  --   APTT  --    < > 30 76*  --  >200* 55*  LABPROT  --   --   --  17.0*  --   --   --   INR  --   --   --  1.40  --   --   --   HEPARINUNFRC  --   --  >2.20*  --  1.58*  --   --   CREATININE 2.43*  --   --  2.22*  --   --   --    < > = values in this interval not displayed.    Estimated Creatinine Clearance: 20.5 mL/min (A) (by C-G formula based on SCr of 2.22 mg/dL (H)).   Assessment: 84 y/o female on apixaban PTA for Afib with an acute L1 burst fracture, subacute L4 fracture, and chronic T12 fracture. Kyphoplasty is being considered. Pharmacy dosing IV heparin. Last dose of apixaban was 6/8 at 09:30. No bleeding noted, CBC is normal. -aPTT= 55 after hold for 1 hr and decrease to 750 units/hr   Goal of Therapy:  Heparin level 0.3-0.7 units/ml aPTT 66-102 seconds Monitor platelets by anticoagulation protocol: Yes   Plan: -Increase heparin to 900 units/hr -aPTT in 8 hrs -Heparin level, aPTT and CBC daily  Hildred Laser, PharmD Clinical  Pharmacist 10/18/2017 8:43 PM

## 2017-10-18 NOTE — Progress Notes (Signed)
Lab reported critical value PTT > 200.  Reported to MD and Pharmacy.

## 2017-10-19 LAB — APTT
aPTT: 73 seconds — ABNORMAL HIGH (ref 24–36)
aPTT: 74 s — ABNORMAL HIGH (ref 24–36)

## 2017-10-19 LAB — CBC
HCT: 48.9 % — ABNORMAL HIGH (ref 36.0–46.0)
Hemoglobin: 15.2 g/dL — ABNORMAL HIGH (ref 12.0–15.0)
MCH: 30 pg (ref 26.0–34.0)
MCHC: 31.1 g/dL (ref 30.0–36.0)
MCV: 96.6 fL (ref 78.0–100.0)
Platelets: 176 10*3/uL (ref 150–400)
RBC: 5.06 MIL/uL (ref 3.87–5.11)
RDW: 16.3 % — ABNORMAL HIGH (ref 11.5–15.5)
WBC: 10.3 10*3/uL (ref 4.0–10.5)

## 2017-10-19 LAB — HEPARIN LEVEL (UNFRACTIONATED): Heparin Unfractionated: 1.24 IU/mL — ABNORMAL HIGH (ref 0.30–0.70)

## 2017-10-19 MED ORDER — BISACODYL 10 MG RE SUPP
10.0000 mg | Freq: Once | RECTAL | Status: AC
  Administered 2017-10-19: 10 mg via RECTAL
  Filled 2017-10-19: qty 1

## 2017-10-19 MED ORDER — MAGNESIUM HYDROXIDE 400 MG/5ML PO SUSP
30.0000 mL | Freq: Once | ORAL | Status: DC
  Filled 2017-10-19: qty 30

## 2017-10-19 NOTE — Progress Notes (Signed)
[place] for Heparin Indication: atrial fibrillation  Allergies  Allergen Reactions  . Augmentin [Amoxicillin-Pot Clavulanate] Diarrhea and Nausea Only  . Boniva [Ibandronic Acid] Diarrhea  . Codeine Other (See Comments)    "Spaced out feeling" and Lightheadedness  . Hydromorphone Nausea And Vomiting  . Latex Rash    "Burns me"  . Lisinopril Cough  . Adhesive [Tape] Itching and Rash    Patient Measurements: Height: 5\' 6"  (167.6 cm) Weight: 178 lb 5.6 oz (80.9 kg) IBW/kg (Calculated) : 59.3 Heparin Dosing Weight: 75 kg  Vital Signs: Temp: 97.9 F (36.6 C) (06/09 0447) Temp Source: Oral (06/09 0447) BP: 165/88 (06/09 0447) Pulse Rate: 75 (06/09 0447)  Labs: Recent Labs    10/16/17 0718  10/16/17 1947 10/17/17 0602 10/18/17 0432 10/18/17 0809 10/18/17 1938 10/19/17 0522  HGB 15.1*  --   --  15.9*  --  15.1*  --  15.2*  HCT 47.4*  --   --  50.8*  --  49.2*  --  48.9*  PLT 161  --   --  163  --  160  --  176  APTT  --    < > 30 76*  --  >200* 55* 73*  LABPROT  --   --   --  17.0*  --   --   --   --   INR  --   --   --  1.40  --   --   --   --   HEPARINUNFRC  --   --  >2.20*  --  1.58*  --   --   --   CREATININE 2.43*  --   --  2.22*  --   --   --   --    < > = values in this interval not displayed.    Estimated Creatinine Clearance: 20.6 mL/min (A) (by C-G formula based on SCr of 2.22 mg/dL (H)).   Assessment: 84 y/o female on apixaban PTA for Afib with an acute L1 burst fracture, subacute L4 fracture, and chronic T12 fracture. Kyphoplasty is being considered. Pharmacy dosing IV heparin. Last dose of apixaban was 6/8 at 09:30. No bleeding noted, CBC is normal.  6/9 AM update: aPTT therapeutic x 1, Hgb stable  Goal of Therapy:  Heparin level 0.3-0.7 units/ml aPTT 66-102 seconds Monitor platelets by anticoagulation protocol: Yes   Plan: Cont heparin at 900 units/hr 1200 aPTT  Narda Bonds, PharmD, BCPS Clinical  Pharmacist Phone: [phone]

## 2017-10-19 NOTE — Progress Notes (Signed)
[place] for Heparin Indication: atrial fibrillation  Allergies  Allergen Reactions  . Augmentin [Amoxicillin-Pot Clavulanate] Diarrhea and Nausea Only  . Boniva [Ibandronic Acid] Diarrhea  . Codeine Other (See Comments)    "Spaced out feeling" and Lightheadedness  . Hydromorphone Nausea And Vomiting  . Latex Rash    "Burns me"  . Lisinopril Cough  . Adhesive [Tape] Itching and Rash    Patient Measurements: Height: 5\' 6"  (167.6 cm) Weight: 178 lb 5.6 oz (80.9 kg) IBW/kg (Calculated) : 59.3 Heparin Dosing Weight: 75 kg  Vital Signs: Temp: 97.9 F (36.6 C) (06/09 0447) Temp Source: Oral (06/09 0447) BP: 165/88 (06/09 0447) Pulse Rate: 68 (06/09 0837)  Labs: Recent Labs    10/16/17 1947  10/17/17 0602 10/18/17 0432 10/18/17 0809 10/18/17 1938 10/19/17 0522 10/19/17 1156  HGB  --    < > 15.9*  --  15.1*  --  15.2*  --   HCT  --   --  50.8*  --  49.2*  --  48.9*  --   PLT  --   --  163  --  160  --  176  --   APTT 30  --  76*  --  >200* 55* 73* 74*  LABPROT  --   --  17.0*  --   --   --   --   --   INR  --   --  1.40  --   --   --   --   --   HEPARINUNFRC >2.20*  --   --  1.58*  --   --  1.24*  --   CREATININE  --   --  2.22*  --   --   --   --   --    < > = values in this interval not displayed.    Estimated Creatinine Clearance: 20.6 mL/min (A) (by C-G formula based on SCr of 2.22 mg/dL (H)).   Assessment: 84 y/o female on apixaban PTA for Afib with an acute L1 burst fracture, subacute L4 fracture, and chronic T12 fracture. Kyphoplasty is being considered. Pharmacy dosing IV heparin. Last dose of apixaban was 6/8 at 09:30.  APTT remains therapeutic at 74, CBC stable and no bleeding noted at this time  Goal of Therapy:  Heparin level 0.3-0.7 units/ml aPTT 66-102 seconds Monitor platelets by anticoagulation protocol: Yes   Plan: Continue heparin gtt at 900 units/hr F/u aPTT/heparin level in AM, also kyphoplasty and  apixaban restart Monitor daily CBC, s/s bleeding  Bertis Ruddy, PharmD Pharmacy Resident [phone] 10/19/2017 1:40 PM

## 2017-10-19 NOTE — Plan of Care (Signed)
  Problem: Education: Goal: Knowledge of General Education information will improve Outcome: Progressing Note:  POC reviewed with pt.   

## 2017-10-19 NOTE — Progress Notes (Signed)
Attending MD note  Patient was seen, examined,treatment plan was discussed with the NP.  I have personally reviewed the clinical findings, lab, imaging studies and management of this patient in detail. I agree with the documentation, as recorded by the NP.   Subjective: Continues to have severe low back pain-8/10  On Exam: Vitals:   10/18/17 2120 10/19/17 0214 10/19/17 0447 10/19/17 0837  BP: 133/80  (!) 165/88   Pulse: 83  75 68  Resp:   18 16  Temp: 98.4 F (36.9 C)  97.9 F (36.6 C)   TempSrc: Oral  Oral   SpO2: 96%  93% 92%  Weight:  80.9 kg (178 lb 5.6 oz)    Height:       Gen. exam: Awake, alert, not in any distress Chest: Good air entry bilaterally, no rhonchi or rales CVS: S1-S2 regular, no murmurs Abdomen: Soft, nontender and nondistended Neurology: Has adequate lower extremity strength-5/5, sensation is grossly intact.   Skin: No rash or lesions   Lab Data: CBC: Recent Labs  Lab 10/15/17 1055 10/16/17 0718 10/17/17 0602 10/18/17 0809 10/19/17 0522  WBC 16.2* 11.1* 9.6 9.9 10.3  HGB 17.6* 15.1* 15.9* 15.1* 15.2*  HCT 54.7* 47.4* 50.8* 49.2* 48.9*  MCV 93.0 95.0 95.1 95.9 96.6  PLT 223 161 163 160 409    Basic Metabolic Panel: Recent Labs  Lab 10/15/17 1055 10/16/17 0718 10/17/17 0602  NA 136 136 140  K 4.8 5.5* 3.7  CL 95* 97* 104  CO2 24 27 28   GLUCOSE 124* 88 106*  BUN 37* 35* 31*  CREATININE 2.67* 2.43* 2.22*  CALCIUM 10.0 9.3 8.9   Assessment and plan: Intractable low back pain secondary to acute L1 compression fracture: Pain continues to be intractable-continue with scheduled Tylenol, as needed oxycodone for moderate pain and Dilaudid for severe pain.  Have added nasal calcitonin for pain as well.  Appreciate Dr. Rolena Infante input-have consulted IR for kyphoplasty.  Her last dose of Eliquis was on 6/6-did speak with Dr. Sabas Sous over the phone yesterday-patient will need to be off Eliquis for at least 48 hours before proceeding with a  kyphoplasty.  Currently on IV heparin.  Acute diverticulitis: Improved-minimal left lower quadrant pain today-continue Rocephin/Flagyl.  Acute kidney injury on chronic kidney disease stage IV: Likely hemodynamically mediated-creatinine improving with supportive care.  Avoid nephrotoxic agents.  Atrial fibrillation: Rate controlled with amiodarone-off Eliquis-since her chads2Va score is around 5-aware starting IV heparin.  Chronic prednisone use: Patient not sure why she is on 10 mg of prednisone-however this is being continued  Rest as below  [facility]       PROGRESS NOTE    FABIHA ROUGEAU  WJX:[id number] DOB: 03-16-1932 DOA: 10/15/2017 PCP: [name], MD   Brief Narrative: Patient is a 84 year old female with prior history of A. fib on Eliquis, hypertension, dyslipidemia, chronic kidney disease stage IV, recent herpes zoster involving her left flank area presented with worsening back pain for several weeks, a CT scan of the abdomen showed L1 compression fracture, it also showed probable left-sided diverticulitis.  She was subsequently started on empiric antimicrobial therapy and admitted to the hospitalist service. Of note she was seen by her orthopedic surgeon for the back pain 4 weeks ago and received a steroid injection.  Dr. Rolena Infante [facility] orthopedics)was consulted, patient underwent a lumbar CT scan on 6/6-which showed a burst L1 fracture-recommendations from orthopedics are to proceed with a kyphoplasty.  Unfortunately patient's last dose of  Eliquis was morning of 6/6-and will need to be off Eliquis for 48 hours before hypoplasty can be done.  Interventional radiology has been consulted.  See below for further details  Subjective:  Patient in bed, appears comfortable, denies any headache, no fever, no chest pain or pressure, no shortness of breath , no abdominal pain. No focal weakness.  Minimal low back pain at rest gets worse when she ambulates  or stands up, no bowel or bladder incontinence, no lower extremity weakness.   Assessment & Plan:  Low back pain in the setting of recent shingles and history L1 and L4 fracture: Lumbar CT shows acute L1 burst fracture, seen by orthopedics Dr. Rolena Infante who recommended IR consultation which was obtained through Dr. Estanislado Pandy, patient will require kyphoplasty.  Since she was on Eliquis she has been transitioned to heparin drip for bridging.  Last Eliquis dose was 10/16/2017.  Continue supportive care.  IR to evaluate and operate likely coming Monday.   Recent herpes zoster left flank area: Lesion have crusted over, continue low dose Neurontin for additional pain management and continue to monior for secondary infcetions  Acute diverticulitis: Incidentally found on renal CT scan-but patient does have mild left lower quadrant pain that has actually improved over the past few days. Continue Rocephin and Flagyl and other supportive care for another day.  Acute on Chronic CKD stage 4:  Creatinine 2.2, now close to baseline, will stop IV fluids but continue to hold home dose Lasix for now.  If renal function remains stable will resume 20 mg oral Lasix daily from tomorrow.   Hyperkalemia: Potassium level 3.4 this am with know CKD stage 4, treated with Veltassa yesterday for potassium 5.5, continue to monitor labs daily   Atrial Fibrillation: Rate remains controlled on Pacerone, Eliquis on hod for possible surgery-since her CHADS2VASC score is around 5-she has been started on IV Heparin while off Eliquis  Hyperlipidemia: Continue home statin   Hypertension: Controlled, continue to hold home Lasix, PRN Hydralazine as needed   ?Chronic steroid use: On 10 mg of prednisone in the outpatient setting-per patient this was started by her nephrologist Dr. Lorrene Reid.   DVT prophylaxis: Heparin drip  Code Status: DNR Family Communication: No family at bedside during morning assessment  Disposition Plan:  Remain inpatient-will require a few mores days of treatment before medically ready for discharge   Consultants:    [place]   Interventional Radiology   Procedures:   None  . acetaminophen  1,000 mg Oral Q6H  . amiodarone  200 mg Oral Daily  . atorvastatin  10 mg Oral Daily  . budesonide (PULMICORT) nebulizer solution  0.25 mg Nebulization BID  . calcitonin (salmon)  1 spray Alternating Nares Daily  . furosemide  20 mg Oral Daily  . gabapentin  100 mg Oral QHS  . latanoprost  1 drop Both Eyes QHS  . lidocaine  1 patch Transdermal Q24H  . metroNIDAZOLE  500 mg Oral Q8H  . pantoprazole  40 mg Oral Q0600  . polyethylene glycol  17 g Oral Daily  . predniSONE  10 mg Oral Q breakfast   . sodium chloride 50 mL/hr at 10/15/17 1657  . cefTRIAXone (ROCEPHIN)  IV 1 g (10/19/17 0944)  . heparin 900 Units/hr (10/18/17 2100)    Anti-infectives (From admission, onward)   Start     Dose/Rate Route Frequency Ordered Stop   10/17/17 0845  metroNIDAZOLE (FLAGYL) tablet 500 mg     500 mg Oral Every 8  hours 10/17/17 0833     10/16/17 1700  ciprofloxacin (CIPRO) IVPB 400 mg  Status:  Discontinued     400 mg 200 mL/hr over 60 Minutes Intravenous Every 24 hours 10/15/17 1723 10/16/17 0849   10/16/17 1000  cefTRIAXone (ROCEPHIN) 1 g in sodium chloride 0.9 % 100 mL IVPB     1 g 200 mL/hr over 30 Minutes Intravenous Every 24 hours 10/16/17 0850     10/16/17 0200  metroNIDAZOLE (FLAGYL) IVPB 500 mg  Status:  Discontinued     500 mg 100 mL/hr over 60 Minutes Intravenous Every 8 hours 10/15/17 1704 10/17/17 0832   10/15/17 1545  ciprofloxacin (CIPRO) IVPB 400 mg     400 mg 200 mL/hr over 60 Minutes Intravenous  Once 10/15/17 1534 10/15/17 1758   10/15/17 1545  metroNIDAZOLE (FLAGYL) IVPB 500 mg     500 mg 100 mL/hr over 60 Minutes Intravenous  Once 10/15/17 1534 10/15/17 2000        Objective: Vitals:   10/18/17 2120 10/19/17 0214 10/19/17 0447 10/19/17 0837  BP: 133/80   (!) 165/88   Pulse: 83  75 68  Resp:   18 16  Temp: 98.4 F (36.9 C)  97.9 F (36.6 C)   TempSrc: Oral  Oral   SpO2: 96%  93% 92%  Weight:  80.9 kg (178 lb 5.6 oz)    Height:        Intake/Output Summary (Last 24 hours) at 10/19/2017 0950 Last data filed at 10/19/2017 0656 Gross per 24 hour  Intake 772.88 ml  Output 300 ml  Net 472.88 ml   Filed Weights   10/16/17 0500 10/18/17 0614 10/19/17 0214  Weight: 79.5 kg (175 lb 4.3 oz) 80.4 kg (177 lb 4 oz) 80.9 kg (178 lb 5.6 oz)    Examination:  Awake Alert, Oriented X 3, No new F.N deficits, Normal affect Calvin.AT,PERRAL Supple Neck,No JVD, No cervical lymphadenopathy appriciated.  Symmetrical Chest wall movement, Good air movement bilaterally, CTAB RRR,No Gallops, Rubs or new Murmurs, No Parasternal Heave +ve B.Sounds, Abd Soft, No tenderness, No organomegaly appriciated, No rebound - guarding or rigidity. No Cyanosis, Clubbing or edema, No new Rash or bruise    Data Reviewed: I have personally reviewed following labs and imaging studies  CBC: Recent Labs  Lab 10/15/17 1055 10/16/17 0718 10/17/17 0602 10/18/17 0809 10/19/17 0522  WBC 16.2* 11.1* 9.6 9.9 10.3  HGB 17.6* 15.1* 15.9* 15.1* 15.2*  HCT 54.7* 47.4* 50.8* 49.2* 48.9*  MCV 93.0 95.0 95.1 95.9 96.6  PLT 223 161 163 160 093   Basic Metabolic Panel: Recent Labs  Lab 10/15/17 1055 10/16/17 0718 10/17/17 0602  NA 136 136 140  K 4.8 5.5* 3.7  CL 95* 97* 104  CO2 24 27 28   GLUCOSE 124* 88 106*  BUN 37* 35* 31*  CREATININE 2.67* 2.43* 2.22*  CALCIUM 10.0 9.3 8.9   GFR: Estimated Creatinine Clearance: 20.6 mL/min (A) (by C-G formula based on SCr of 2.22 mg/dL (H)). Liver Function Tests: No results for input(s): AST, ALT, ALKPHOS, BILITOT, PROT, ALBUMIN in the last 168 hours. No results for input(s): LIPASE, AMYLASE in the last 168 hours. No results for input(s): AMMONIA in the last 168 hours. Coagulation Profile: Recent Labs  Lab 10/17/17 0602  INR  1.40   Cardiac Enzymes: No results for input(s): CKTOTAL, CKMB, CKMBINDEX, TROPONINI in the last 168 hours. BNP (last 3 results) No results for input(s): PROBNP in the last 8760 hours. HbA1C: No results  for input(s): HGBA1C in the last 72 hours. CBG: No results for input(s): GLUCAP in the last 168 hours. Lipid Profile: No results for input(s): CHOL, HDL, LDLCALC, TRIG, CHOLHDL, LDLDIRECT in the last 72 hours. Thyroid Function Tests: No results for input(s): TSH, T4TOTAL, FREET4, T3FREE, THYROIDAB in the last 72 hours. Anemia Panel: No results for input(s): VITAMINB12, FOLATE, FERRITIN, TIBC, IRON, RETICCTPCT in the last 72 hours. Urine analysis:    Component Value Date/Time   COLORURINE YELLOW 10/16/2017 2117   APPEARANCEUR CLEAR 10/16/2017 2117   APPEARANCEUR Clear 07/17/2017 1354   LABSPEC 1.011 10/16/2017 2117   PHURINE 5.0 10/16/2017 2117   GLUCOSEU NEGATIVE 10/16/2017 2117   HGBUR MODERATE (A) 10/16/2017 2117   BILIRUBINUR NEGATIVE 10/16/2017 2117   BILIRUBINUR Negative 07/17/2017 1354   KETONESUR NEGATIVE 10/16/2017 2117   PROTEINUR NEGATIVE 10/16/2017 2117   UROBILINOGEN 0.2 02/05/2011 0915   NITRITE NEGATIVE 10/16/2017 2117   LEUKOCYTESUR NEGATIVE 10/16/2017 2117   LEUKOCYTESUR Negative 07/17/2017 1354   Sepsis Labs: @LABRCNTIP (procalcitonin:4,lacticidven:4)  )No results found for this or any previous visit (from the past 240 hour(s)).   Radiology Studies: Mr Lumbar Spine Wo Contrast  Result Date: 10/18/2017 CLINICAL DATA:  L1 fracture.  Evaluation for spinal augmentation. EXAM: MRI LUMBAR SPINE WITHOUT CONTRAST TECHNIQUE: Multiplanar, multisequence MR imaging of the lumbar spine was performed. No intravenous contrast was administered. COMPARISON:  Abdominopelvic CT 10/15/2017 and 12/24/2016. Lumbar spine CT 10/16/2017 FINDINGS: Despite efforts by the technologist and patient, mild motion artifact is present on today's exam and could not be eliminated. This reduces  exam sensitivity and specificity. Motion is greatest on the sagittal T2 weighted images. Segmentation: Conventional anatomy assumed, with the last open disc space designated L5-S1.This is concordant with previous imaging. Alignment:  Stable. Vertebrae: There is progressive loss of height at the acute L1 burst fracture now measuring 10 mm craniocaudal, previously 12 mm. There is approximately 8 mm of osseous retropulsion. There is marrow edema throughout the L1 vertebral body with probable linear hemorrhage in the horizontal plane. The posterior elements are intact. There are no other acute fractures. There is a mild superior endplate compression fracture at L4 which is new from 2018, although largely healed with minimal subchondral edema. Chronic T12 fracture is healed without residual edema. The visualized sacroiliac joints appear unremarkable. Paraspinal and other soft tissues: No paraspinal hematoma demonstrated. There are bilateral renal cysts. Disc levels: T11-12: Disc bulging with endplate osteophytes asymmetric to the right. No cord deformity or foraminal compromise. T12-L1: No significant findings. L1-2: The retropulsed bone exerts some mass effect on the thecal sac, but no cord deformity. At the level of the disc, there is no spinal stenosis or nerve root encroachment. L2-3: Mild disc bulging. No spinal stenosis or nerve root encroachment. L3-4: Annular disc bulging, mild osseous retropulsion from the L4 fracture and facet/ligamentous hypertrophy contribute to mild spinal stenosis. The lateral recesses and foramina are patent. L4-5: Stable disc bulging, facet and ligamentous hypertrophy. No nerve root encroachment. L5-S1: Stable chronic degenerative disc disease with endplate osteophytes and mild facet hypertrophy. Mild inferior foraminal narrowing without definite nerve root encroachment. IMPRESSION: 1. L1 burst fracture demonstrates progressive loss of height and osseous retropulsion compared with CT 2  days ago. This fracture appears acute with marrow edema and probable hemorrhage. 2. Late subacute superior endplate compression fracture at L4 is largely healed with minimal residual marrow edema. This fracture has developed since 12/24/2016. 3. Chronic T12 compression fracture. 4. Multilevel spondylosis as described, without high-grade spinal stenosis  or nerve root encroachment. Electronically Signed   By: Richardean Sale M.D.   On: 10/18/2017 17:15     LOS: 4 days    Time spent:   30 minutes-Greater than 50% of this time was spent in counseling, explanation of diagnosis, planning of further management, and coordination of care.   Signature  Lala Lund M.D on 10/19/2017 at 9:50 AM  Between 7am to 7pm - Pager - [phone] ( page via [email or web address], text pages only, please mention full 10 digit call back number).  After 7pm go to [email or web address] - password [email or web address]

## 2017-10-20 LAB — BASIC METABOLIC PANEL
ANION GAP: 12 (ref 5–15)
BUN: 22 mg/dL — ABNORMAL HIGH (ref 6–20)
CHLORIDE: 103 mmol/L (ref 101–111)
CO2: 28 mmol/L (ref 22–32)
Calcium: 9.6 mg/dL (ref 8.9–10.3)
Creatinine, Ser: 1.8 mg/dL — ABNORMAL HIGH (ref 0.44–1.00)
GFR calc non Af Amer: 25 mL/min — ABNORMAL LOW (ref >60)
GFR, EST AFRICAN AMERICAN: 29 mL/min — AB (ref >60)
Glucose, Bld: 98 mg/dL (ref 65–99)
POTASSIUM: 4.5 mmol/L (ref 3.5–5.1)
SODIUM: 143 mmol/L (ref 135–145)

## 2017-10-20 LAB — CBC
HEMATOCRIT: 51.7 % — AB (ref 36.0–46.0)
Hemoglobin: 16 g/dL — ABNORMAL HIGH (ref 12.0–15.0)
MCH: 30.1 pg (ref 26.0–34.0)
MCHC: 30.9 g/dL (ref 30.0–36.0)
MCV: 97.2 fL (ref 78.0–100.0)
Platelets: 187 10*3/uL (ref 150–400)
RBC: 5.32 MIL/uL — AB (ref 3.87–5.11)
RDW: 16.3 % — ABNORMAL HIGH (ref 11.5–15.5)
WBC: 11.7 10*3/uL — AB (ref 4.0–10.5)

## 2017-10-20 LAB — APTT: aPTT: 71 seconds — ABNORMAL HIGH (ref 24–36)

## 2017-10-20 LAB — HEPARIN LEVEL (UNFRACTIONATED): Heparin Unfractionated: 1.04 IU/mL — ABNORMAL HIGH (ref 0.30–0.70)

## 2017-10-20 MED ORDER — HEPARIN (PORCINE) IN NACL 100-0.45 UNIT/ML-% IJ SOLN: 900.0000 [IU]/h | INTRAMUSCULAR | Status: DC

## 2017-10-20 NOTE — Progress Notes (Signed)
ANTICOAGULATION CONSULT NOTE - Follow Up Consult  Pharmacy Consult for Heparin (while Eliquis on hold) Indication: atrial fibrillation  Allergies  Allergen Reactions  . Augmentin [Amoxicillin-Pot Clavulanate] Diarrhea and Nausea Only  . Boniva [Ibandronic Acid] Diarrhea  . Codeine Other (See Comments)    "Spaced out feeling" and Lightheadedness  . Hydromorphone Nausea And Vomiting  . Latex Rash    "Burns me"  . Lisinopril Cough  . Adhesive [Tape] Itching and Rash    Patient Measurements: Height: 5\' 6"  (167.6 cm) Weight: 177 lb 7.5 oz (80.5 kg) IBW/kg (Calculated) : 59.3 Heparin Dosing Weight: 75 kg  Vital Signs: Temp: 97.7 F (36.5 C) (06/10 0532) Temp Source: Oral (06/10 0532) BP: 158/91 (06/10 0532) Pulse Rate: 76 (06/10 0532)  Labs: Recent Labs    10/18/17 0432  10/18/17 0809  10/19/17 0522 10/19/17 1156 10/20/17 0246  HGB  --    < > 15.1*  --  15.2*  --  16.0*  HCT  --   --  49.2*  --  48.9*  --  51.7*  PLT  --   --  160  --  176  --  187  APTT  --   --  >200*   < > 73* 74* 71*  HEPARINUNFRC 1.58*  --   --   --  1.24*  --  1.04*  CREATININE  --   --   --   --   --   --  1.80*   < > = values in this interval not displayed.    Estimated Creatinine Clearance: 25.3 mL/min (A) (by C-G formula based on SCr of 1.8 mg/dL (H)).   Medications:  Scheduled:  . acetaminophen  1,000 mg Oral Q6H  . amiodarone  200 mg Oral Daily  . atorvastatin  10 mg Oral Daily  . calcitonin (salmon)  1 spray Alternating Nares Daily  . furosemide  20 mg Oral Daily  . gabapentin  100 mg Oral QHS  . latanoprost  1 drop Both Eyes QHS  . lidocaine  1 patch Transdermal Q24H  . magnesium hydroxide  30 mL Oral Once  . metroNIDAZOLE  500 mg Oral Q8H  . pantoprazole  40 mg Oral Q0600  . polyethylene glycol  17 g Oral Daily  . predniSONE  10 mg Oral Q breakfast   Infusions:  . sodium chloride 50 mL/hr at 10/15/17 1657  . heparin 900 Units/hr (10/19/17 2300)    Assessment: 84 yo F  on Eliquis PTA for hx afib.  Pt was transitioned to heparin pending plans for kyphoplasty (scheduled for 6/11).  Monitoring anticoagulation using aPTTs because anti-Xa levels are influenced by recent Eliquis dosing.  aPTT 71 >> therapeutic on 900 units/hr anti-Xa (heparin level) 1.04  Goal of Therapy:  Heparin level 0.3-0.7 units/ml aPTT 66-102 seconds Monitor platelets by anticoagulation protocol: Yes   Plan:  Continue heparin at 900 units/hr F/u aPTT/heparin level in AM Called IR and informed them patient is on heparin infusion.  They are to address heparin stop time prior to procedure 6/11.   [employer], Pharm.D., BCPS Clinical Pharmacist Pager: [phone] Clinical phone for 10/20/2017 from 8:30-4:00 is [phone]. After 4pm, please call Main [facility] [phone]) for assistance. 10/20/2017 11:05 AM

## 2017-10-20 NOTE — Plan of Care (Signed)
  Problem: Education: Goal: Knowledge of General Education information will improve 10/20/2017 0516 by Anson Fret, RN Outcome: Progressing 10/20/2017 0513 by Anson Fret, RN Outcome: Progressing Note:  POC reviewed with pt.; pt./husband aware pt.'s to have kyphoplasty today in IR; pt. NPO.

## 2017-10-20 NOTE — Progress Notes (Signed)
Attending MD note  Patient was seen, examined,treatment plan was discussed with the NP.  I have personally reviewed the clinical findings, lab, imaging studies and management of this patient in detail. I agree with the documentation, as recorded by the NP.   Subjective: Continues to have severe low back pain-8/10  On Exam: Vitals:   10/19/17 1956 10/19/17 2030 10/20/17 0500 10/20/17 0532  BP:  (!) 169/84  (!) 158/91  Pulse:  74  76  Resp:  18    Temp:  98.4 F (36.9 C)  97.7 F (36.5 C)  TempSrc:  Oral  Oral  SpO2: 90% 95%  91%  Weight:   80.5 kg (177 lb 7.5 oz)   Height:       Gen. exam: Awake, alert, not in any distress Chest: Good air entry bilaterally, no rhonchi or rales CVS: S1-S2 regular, no murmurs Abdomen: Soft, nontender and nondistended Neurology: Has adequate lower extremity strength-5/5, sensation is grossly intact.   Skin: No rash or lesions   Lab Data: CBC: Recent Labs  Lab 10/16/17 0718 10/17/17 0602 10/18/17 0809 10/19/17 0522 10/20/17 0246  WBC 11.1* 9.6 9.9 10.3 11.7*  HGB 15.1* 15.9* 15.1* 15.2* 16.0*  HCT 47.4* 50.8* 49.2* 48.9* 51.7*  MCV 95.0 95.1 95.9 96.6 97.2  PLT 161 163 160 176 619    Basic Metabolic Panel: Recent Labs  Lab 10/15/17 1055 10/16/17 0718 10/17/17 0602 10/20/17 0246  NA 136 136 140 143  K 4.8 5.5* 3.7 4.5  CL 95* 97* 104 103  CO2 24 27 28 28   GLUCOSE 124* 88 106* 98  BUN 37* 35* 31* 22*  CREATININE 2.67* 2.43* 2.22* 1.80*  CALCIUM 10.0 9.3 8.9 9.6   Assessment and plan: Intractable low back pain secondary to acute L1 compression fracture: Pain continues to be intractable-continue with scheduled Tylenol, as needed oxycodone for moderate pain and Dilaudid for severe pain.  Have added nasal calcitonin for pain as well.  Appreciate Dr. Rolena Infante input-have consulted IR for kyphoplasty.  Her last dose of Eliquis was on 6/6-did speak with Dr. Sabas Sous over the phone yesterday-patient will need to be off Eliquis for at  least 48 hours before proceeding with a kyphoplasty.  Currently on IV heparin.  Acute diverticulitis: Improved-minimal left lower quadrant pain today-continue Rocephin/Flagyl.  Acute kidney injury on chronic kidney disease stage IV: Likely hemodynamically mediated-creatinine improving with supportive care.  Avoid nephrotoxic agents.  Atrial fibrillation: Rate controlled with amiodarone-off Eliquis-since her chads2Va score is around 5-aware starting IV heparin.  Chronic prednisone use: Patient not sure why she is on 10 mg of prednisone-however this is being continued  Rest as below  [facility]       PROGRESS NOTE    BRIDGETT HATTABAUGH  JKD:[id number] DOB: 1932-03-14 DOA: 10/15/2017 PCP: [name], MD   Brief Narrative: Patient is a 84 year old female with prior history of A. fib on Eliquis, hypertension, dyslipidemia, chronic kidney disease stage IV, recent herpes zoster involving her left flank area presented with worsening back pain for several weeks, a CT scan of the abdomen showed L1 compression fracture, it also showed probable left-sided diverticulitis.  She was subsequently started on empiric antimicrobial therapy and admitted to the hospitalist service. Of note she was seen by her orthopedic surgeon for the back pain 4 weeks ago and received a steroid injection.  Dr. Rolena Infante [facility] orthopedics)was consulted, patient underwent a lumbar CT scan on 6/6-which showed a burst L1 fracture-recommendations from orthopedics are  to proceed with a kyphoplasty.  Unfortunately patient's last dose of Eliquis was morning of 6/6-and will need to be off Eliquis for 48 hours before hypoplasty can be done.  Interventional radiology has been consulted.  See below for further details  Subjective: Patient in bed, appears comfortable, denies any headache, no fever, no chest pain or pressure, no shortness of breath , no abdominal pain. No focal weakness.  Minimal low back pain  at rest gets worse when she ambulates or stands up, no bowel or bladder incontinence, no lower extremity weakness.   Assessment & Plan:  Low back pain in the setting of recent shingles and history L1 and L4 fracture: Lumbar CT shows acute L1 burst fracture, seen by orthopedics Dr. Rolena Infante who recommended IR consultation which was obtained through Dr. Estanislado Pandy, patient will require kyphoplasty likely to be done 10/20/17. No new alarming neurological symptoms, since she was on Eliquis she has been transitioned to heparin drip for bridging, her last Eliquis dose was 10/16/2017.  Continue supportive care.   Recent herpes zoster left flank area: Lesion have crusted over, continue low dose Neurontin for additional pain management and continue to monior for secondary infcetions  Acute diverticulitis: Incidentally found on renal CT scan-but patient does have mild left lower quadrant pain, he has been adequately treated with antibiotics which I will stop on 10/20/2017 as her symptoms have completely resolved and she has no abdominal tenderness or diarrhea at all.  Acute on Chronic CKD stage 4:  Creatinine 2.2, now close to baseline, will stop IV fluids but continue to hold home dose Lasix for now.  If renal function remains stable will resume 20 mg oral Lasix daily from tomorrow.   Hyperkalemia: Potassium level 3.4 this am with know CKD stage 4, treated with Veltassa yesterday for potassium 5.5, continue to monitor labs daily   Atrial Fibrillation: Rate remains controlled on Pacerone, Eliquis on hod for possible surgery-since her CHADS2VASC score is around 5-she has been started on IV Heparin while off Eliquis  Hyperlipidemia: Continue home statin   Hypertension: Controlled, continue to hold home Lasix, PRN Hydralazine as needed   ?Chronic steroid use: On 10 mg of prednisone in the outpatient setting-per patient this was started by her nephrologist Dr. Lorrene Reid.   DVT prophylaxis: Heparin drip    Code Status: DNR Family Communication:  Husband bedside on 10/20/2017 Disposition Plan: Remain inpatient-will require a few mores days of treatment before medically ready for discharge   Consultants:    [place]   Interventional Radiology   Procedures:   None  . acetaminophen  1,000 mg Oral Q6H  . amiodarone  200 mg Oral Daily  . atorvastatin  10 mg Oral Daily  . calcitonin (salmon)  1 spray Alternating Nares Daily  . furosemide  20 mg Oral Daily  . gabapentin  100 mg Oral QHS  . latanoprost  1 drop Both Eyes QHS  . lidocaine  1 patch Transdermal Q24H  . magnesium hydroxide  30 mL Oral Once  . metroNIDAZOLE  500 mg Oral Q8H  . pantoprazole  40 mg Oral Q0600  . polyethylene glycol  17 g Oral Daily  . predniSONE  10 mg Oral Q breakfast   . sodium chloride 50 mL/hr at 10/15/17 1657  . cefTRIAXone (ROCEPHIN)  IV Stopped (10/19/17 1014)  . heparin 900 Units/hr (10/19/17 2300)    Anti-infectives (From admission, onward)   Start     Dose/Rate Route Frequency Ordered Stop   10/17/17 0845  metroNIDAZOLE (FLAGYL) tablet 500 mg     500 mg Oral Every 8 hours 10/17/17 0833     10/16/17 1700  ciprofloxacin (CIPRO) IVPB 400 mg  Status:  Discontinued     400 mg 200 mL/hr over 60 Minutes Intravenous Every 24 hours 10/15/17 1723 10/16/17 0849   10/16/17 1000  cefTRIAXone (ROCEPHIN) 1 g in sodium chloride 0.9 % 100 mL IVPB     1 g 200 mL/hr over 30 Minutes Intravenous Every 24 hours 10/16/17 0850     10/16/17 0200  metroNIDAZOLE (FLAGYL) IVPB 500 mg  Status:  Discontinued     500 mg 100 mL/hr over 60 Minutes Intravenous Every 8 hours 10/15/17 1704 10/17/17 0832   10/15/17 1545  ciprofloxacin (CIPRO) IVPB 400 mg     400 mg 200 mL/hr over 60 Minutes Intravenous  Once 10/15/17 1534 10/15/17 1758   10/15/17 1545  metroNIDAZOLE (FLAGYL) IVPB 500 mg     500 mg 100 mL/hr over 60 Minutes Intravenous  Once 10/15/17 1534 10/15/17 2000        Objective: Vitals:    10/19/17 1956 10/19/17 2030 10/20/17 0500 10/20/17 0532  BP:  (!) 169/84  (!) 158/91  Pulse:  74  76  Resp:  18    Temp:  98.4 F (36.9 C)  97.7 F (36.5 C)  TempSrc:  Oral  Oral  SpO2: 90% 95%  91%  Weight:   80.5 kg (177 lb 7.5 oz)   Height:        Intake/Output Summary (Last 24 hours) at 10/20/2017 0826 Last data filed at 10/20/2017 0650 Gross per 24 hour  Intake 528 ml  Output 450 ml  Net 78 ml   Filed Weights   10/18/17 0614 10/19/17 0214 10/20/17 0500  Weight: 80.4 kg (177 lb 4 oz) 80.9 kg (178 lb 5.6 oz) 80.5 kg (177 lb 7.5 oz)    Examination:  Awake Alert, Oriented X 3, No new F.N deficits, Normal affect [place].AT,PERRAL Supple Neck,No JVD, No cervical lymphadenopathy appriciated.  Symmetrical Chest wall movement, Good air movement bilaterally, CTAB RRR,No Gallops, Rubs or new Murmurs, No Parasternal Heave +ve B.Sounds, Abd Soft, No tenderness, No organomegaly appriciated, No rebound - guarding or rigidity. No Cyanosis, Clubbing or edema, No new Rash or bruise   Data Reviewed: I have personally reviewed following labs and imaging studies  CBC: Recent Labs  Lab 10/16/17 0718 10/17/17 0602 10/18/17 0809 10/19/17 0522 10/20/17 0246  WBC 11.1* 9.6 9.9 10.3 11.7*  HGB 15.1* 15.9* 15.1* 15.2* 16.0*  HCT 47.4* 50.8* 49.2* 48.9* 51.7*  MCV 95.0 95.1 95.9 96.6 97.2  PLT 161 163 160 176 314   Basic Metabolic Panel: Recent Labs  Lab 10/15/17 1055 10/16/17 0718 10/17/17 0602 10/20/17 0246  NA 136 136 140 143  K 4.8 5.5* 3.7 4.5  CL 95* 97* 104 103  CO2 24 27 28 28   GLUCOSE 124* 88 106* 98  BUN 37* 35* 31* 22*  CREATININE 2.67* 2.43* 2.22* 1.80*  CALCIUM 10.0 9.3 8.9 9.6   GFR: Estimated Creatinine Clearance: 25.3 mL/min (A) (by C-G formula based on SCr of 1.8 mg/dL (H)). Liver Function Tests: No results for input(s): AST, ALT, ALKPHOS, BILITOT, PROT, ALBUMIN in the last 168 hours. No results for input(s): LIPASE, AMYLASE in the last 168 hours. No results  for input(s): AMMONIA in the last 168 hours. Coagulation Profile: Recent Labs  Lab 10/17/17 0602  INR 1.40   Cardiac Enzymes: No results for input(s): CKTOTAL, CKMB,  CKMBINDEX, TROPONINI in the last 168 hours. BNP (last 3 results) No results for input(s): PROBNP in the last 8760 hours. HbA1C: No results for input(s): HGBA1C in the last 72 hours. CBG: No results for input(s): GLUCAP in the last 168 hours. Lipid Profile: No results for input(s): CHOL, HDL, LDLCALC, TRIG, CHOLHDL, LDLDIRECT in the last 72 hours. Thyroid Function Tests: No results for input(s): TSH, T4TOTAL, FREET4, T3FREE, THYROIDAB in the last 72 hours. Anemia Panel: No results for input(s): VITAMINB12, FOLATE, FERRITIN, TIBC, IRON, RETICCTPCT in the last 72 hours. Urine analysis:    Component Value Date/Time   COLORURINE YELLOW 10/16/2017 2117   APPEARANCEUR CLEAR 10/16/2017 2117   APPEARANCEUR Clear 07/17/2017 1354   LABSPEC 1.011 10/16/2017 2117   PHURINE 5.0 10/16/2017 2117   GLUCOSEU NEGATIVE 10/16/2017 2117   HGBUR MODERATE (A) 10/16/2017 2117   BILIRUBINUR NEGATIVE 10/16/2017 2117   BILIRUBINUR Negative 07/17/2017 1354   KETONESUR NEGATIVE 10/16/2017 2117   PROTEINUR NEGATIVE 10/16/2017 2117   UROBILINOGEN 0.2 02/05/2011 0915   NITRITE NEGATIVE 10/16/2017 2117   LEUKOCYTESUR NEGATIVE 10/16/2017 2117   LEUKOCYTESUR Negative 07/17/2017 1354   Sepsis Labs: @LABRCNTIP (procalcitonin:4,lacticidven:4)  )No results found for this or any previous visit (from the past 240 hour(s)).   Radiology Studies: Mr Lumbar Spine Wo Contrast  Result Date: 10/18/2017 CLINICAL DATA:  L1 fracture.  Evaluation for spinal augmentation. EXAM: MRI LUMBAR SPINE WITHOUT CONTRAST TECHNIQUE: Multiplanar, multisequence MR imaging of the lumbar spine was performed. No intravenous contrast was administered. COMPARISON:  Abdominopelvic CT 10/15/2017 and 12/24/2016. Lumbar spine CT 10/16/2017 FINDINGS: Despite efforts by the  technologist and patient, mild motion artifact is present on today's exam and could not be eliminated. This reduces exam sensitivity and specificity. Motion is greatest on the sagittal T2 weighted images. Segmentation: Conventional anatomy assumed, with the last open disc space designated L5-S1.This is concordant with previous imaging. Alignment:  Stable. Vertebrae: There is progressive loss of height at the acute L1 burst fracture now measuring 10 mm craniocaudal, previously 12 mm. There is approximately 8 mm of osseous retropulsion. There is marrow edema throughout the L1 vertebral body with probable linear hemorrhage in the horizontal plane. The posterior elements are intact. There are no other acute fractures. There is a mild superior endplate compression fracture at L4 which is new from 2018, although largely healed with minimal subchondral edema. Chronic T12 fracture is healed without residual edema. The visualized sacroiliac joints appear unremarkable. Paraspinal and other soft tissues: No paraspinal hematoma demonstrated. There are bilateral renal cysts. Disc levels: T11-12: Disc bulging with endplate osteophytes asymmetric to the right. No cord deformity or foraminal compromise. T12-L1: No significant findings. L1-2: The retropulsed bone exerts some mass effect on the thecal sac, but no cord deformity. At the level of the disc, there is no spinal stenosis or nerve root encroachment. L2-3: Mild disc bulging. No spinal stenosis or nerve root encroachment. L3-4: Annular disc bulging, mild osseous retropulsion from the L4 fracture and facet/ligamentous hypertrophy contribute to mild spinal stenosis. The lateral recesses and foramina are patent. L4-5: Stable disc bulging, facet and ligamentous hypertrophy. No nerve root encroachment. L5-S1: Stable chronic degenerative disc disease with endplate osteophytes and mild facet hypertrophy. Mild inferior foraminal narrowing without definite nerve root encroachment.  IMPRESSION: 1. L1 burst fracture demonstrates progressive loss of height and osseous retropulsion compared with CT 2 days ago. This fracture appears acute with marrow edema and probable hemorrhage. 2. Late subacute superior endplate compression fracture at L4 is largely healed with  minimal residual marrow edema. This fracture has developed since 12/24/2016. 3. Chronic T12 compression fracture. 4. Multilevel spondylosis as described, without high-grade spinal stenosis or nerve root encroachment. Electronically Signed   By: Richardean Sale M.D.   On: 10/18/2017 17:15     LOS: 5 days    Time spent:   30 minutes-Greater than 50% of this time was spent in counseling, explanation of diagnosis, planning of further management, and coordination of care.   Signature  Lala Lund M.D on 10/20/2017 at 8:26 AM  Between 7am to 7pm - Pager - [phone] ( page via [email or web address], text pages only, please mention full 10 digit call back number).  After 7pm go to [email or web address] - password [facility]

## 2017-10-21 DIAGNOSIS — Z881 Allergy status to other antibiotic agents status: Secondary | ICD-10-CM

## 2017-10-21 DIAGNOSIS — Z885 Allergy status to narcotic agent status: Secondary | ICD-10-CM

## 2017-10-21 DIAGNOSIS — M8008XA Age-related osteoporosis with current pathological fracture, vertebra(e), initial encounter for fracture: Secondary | ICD-10-CM

## 2017-10-21 DIAGNOSIS — Z7901 Long term (current) use of anticoagulants: Secondary | ICD-10-CM

## 2017-10-21 DIAGNOSIS — I480 Paroxysmal atrial fibrillation: Secondary | ICD-10-CM

## 2017-10-21 DIAGNOSIS — Z8619 Personal history of other infectious and parasitic diseases: Secondary | ICD-10-CM

## 2017-10-21 DIAGNOSIS — Z8249 Family history of ischemic heart disease and other diseases of the circulatory system: Secondary | ICD-10-CM

## 2017-10-21 DIAGNOSIS — Z91040 Latex allergy status: Secondary | ICD-10-CM

## 2017-10-21 DIAGNOSIS — B029 Zoster without complications: Secondary | ICD-10-CM

## 2017-10-21 DIAGNOSIS — Z91048 Other nonmedicinal substance allergy status: Secondary | ICD-10-CM

## 2017-10-21 DIAGNOSIS — Z888 Allergy status to other drugs, medicaments and biological substances status: Secondary | ICD-10-CM

## 2017-10-21 LAB — CBC
HCT: 52.8 % — ABNORMAL HIGH (ref 36.0–46.0)
Hemoglobin: 16.3 g/dL — ABNORMAL HIGH (ref 12.0–15.0)
MCH: 29.5 pg (ref 26.0–34.0)
MCHC: 30.9 g/dL (ref 30.0–36.0)
MCV: 95.7 fL (ref 78.0–100.0)
PLATELETS: 193 10*3/uL (ref 150–400)
RBC: 5.52 MIL/uL — ABNORMAL HIGH (ref 3.87–5.11)
RDW: 16.9 % — AB (ref 11.5–15.5)
WBC: 10.5 10*3/uL (ref 4.0–10.5)

## 2017-10-21 LAB — PROTIME-INR
INR: 1.04
PROTHROMBIN TIME: 13.5 s (ref 11.4–15.2)

## 2017-10-21 LAB — SURGICAL PCR SCREEN
MRSA, PCR: NEGATIVE
Staphylococcus aureus: NEGATIVE

## 2017-10-21 MED ORDER — HYDRALAZINE HCL 20 MG/ML IJ SOLN: 10.0000 mg | Freq: Four times a day (QID) | INTRAMUSCULAR | Status: DC | PRN

## 2017-10-21 MED ORDER — HEPARIN (PORCINE) IN NACL 100-0.45 UNIT/ML-% IJ SOLN: 900.0000 [IU]/h | INTRAMUSCULAR | Status: DC

## 2017-10-21 MED ORDER — MUPIROCIN 2 % EX OINT: 1.0000 "application " | TOPICAL_OINTMENT | Freq: Two times a day (BID) | CUTANEOUS | Status: DC

## 2017-10-21 NOTE — NC FL2 (Signed)
[place] LEVEL OF CARE SCREENING TOOL     IDENTIFICATION  Patient Name: Heather Benson Birthdate: [birth year] Sex: female Admission Date (Current Location): 10/15/2017  [facility] and [place] Number:  Herbalist and Address:  [facility] [place], [place] [address] [place], [place] [address]      Provider Number: [id number]  Attending Physician Name and Address:  Thurnell Lose, MD  Relative Name and Phone Number:  [name], spouse, [phone]    Current Level of Care: Hospital Recommended Level of Care: [place] Prior Approval Number:    Date Approved/Denied:   PASRR Number: [id number]  Discharge Plan: SNF    Current Diagnoses: Patient Active Problem List   Diagnosis Date Noted  . Acute renal failure ([place])   . Lumbar pain 10/15/2017  . History of herpes zoster 10/15/2017  . Acute diverticulitis 10/15/2017  . Diverticulitis 10/15/2017  . Congestive heart failure ([place]) 09/05/2017  . Diverticulitis of colon with hemorrhage 12/25/2016  . Acute lower UTI 12/25/2016  . Aortic atherosclerosis ([place]) 12/25/2016  . Acute diastolic CHF (congestive heart failure) ([place])   . CAP (community acquired pneumonia) 12/13/2015  . Elevated troponin 12/13/2015  . Anemia in chronic renal disease 12/13/2015  . Hyponatremia 12/13/2015  . DOE (dyspnea on exertion) 03/06/2015  . Bradycardia 08/23/2014  . Essential hypertension 08/23/2014  . Lightheadedness 08/23/2014  . Fatigue 06/22/2014  . PAF (paroxysmal atrial fibrillation) ([place]) 01/27/2014  . CKD (chronic kidney disease), stage IV ([place]) 01/27/2014  . Swelling of limb 04/01/2012  . Edema leg 04/01/2012    Orientation RESPIRATION BLADDER Height & Weight     Self, Time, Situation, Place  Normal Continent, External catheter Weight: 80.6 kg (177 lb 11.1 oz) Height:  5\' 6"  (167.6 cm)  BEHAVIORAL SYMPTOMS/MOOD NEUROLOGICAL BOWEL NUTRITION STATUS      Continent Diet(Please see DC  Summary)  AMBULATORY STATUS COMMUNICATION OF NEEDS Skin   Limited Assist Verbally Normal                       Personal Care Assistance Level of Assistance  Bathing, Feeding, Dressing Bathing Assistance: Maximum assistance Feeding assistance: Limited assistance Dressing Assistance: Limited assistance     Functional Limitations Info  Sight, Hearing, Speech Sight Info: Adequate Hearing Info: Adequate Speech Info: Adequate    SPECIAL CARE FACTORS FREQUENCY  PT (By licensed PT), OT (By licensed OT)     PT Frequency: 5x/week OT Frequency: 3x/week            Contractures      Additional Factors Info  Code Status, Allergies Code Status Info: DNR Allergies Info: Augmentin Amoxicillin-pot Clavulanate, Boniva Ibandronic Acid, Codeine, Hydromorphone, Latex, Lisinopril, Adhesive Tape           Current Medications (10/21/2017):  This is the current hospital active medication list Current Facility-Administered Medications  Medication Dose Route Frequency Provider Last Rate Last Dose  . 0.9 %  sodium chloride infusion   Intravenous Continuous Jonetta Osgood, MD 50 mL/hr at 10/15/17 1657    . acetaminophen (TYLENOL) tablet 1,000 mg  1,000 mg Oral Q6H Vann, Jessica U, DO   1,000 mg at 10/17/17 0529  . amiodarone (PACERONE) tablet 200 mg  200 mg Oral Daily Rondel Jumbo, PA-C   200 mg at 10/21/17 1655  . atorvastatin (LIPITOR) tablet 10 mg  10 mg Oral Daily Rondel Jumbo, PA-C   10 mg at 10/21/17 0827  . bisacodyl (  DULCOLAX) suppository 10 mg  10 mg Rectal Daily PRN Rondel Jumbo, PA-C      . calcitonin (salmon) (MIACALCIN/FORTICAL) nasal spray 1 spray  1 spray Alternating Nares Daily Ghimire, Henreitta Leber, MD   1 spray at 10/19/17 1000  . fluticasone (FLONASE) 50 MCG/ACT nasal spray 1 spray  1 spray Each Nare Daily PRN Rondel Jumbo, PA-C      . furosemide (LASIX) tablet 20 mg  20 mg Oral Daily Rondel Jumbo, PA-C   20 mg at 10/21/17 0786  . gabapentin (NEURONTIN)  capsule 100 mg  100 mg Oral QHS Vann, Jessica U, DO   100 mg at 10/20/17 2100  . hydrALAZINE (APRESOLINE) injection 10 mg  10 mg Intravenous Q6H PRN Thurnell Lose, MD      . HYDROmorphone (DILAUDID) injection 0.5 mg  0.5 mg Intravenous Q4H PRN Eulogio Bear U, DO   0.5 mg at 10/21/17 0639  . latanoprost (XALATAN) 0.005 % ophthalmic solution 1 drop  1 drop Both Eyes QHS Rondel Jumbo, PA-C   1 drop at 10/20/17 2100  . lidocaine (LIDODERM) 5 % 1 patch  1 patch Transdermal Q24H Jonetta Osgood, MD   1 patch at 10/20/17 1355  . magnesium hydroxide (MILK OF MAGNESIA) suspension 30 mL  30 mL Oral Once Thurnell Lose, MD   Stopped at 10/19/17 1923  . meclizine (ANTIVERT) tablet 12.5 mg  12.5 mg Oral TID PRN Rondel Jumbo, PA-C      . ondansetron [facility]) tablet 4 mg  4 mg Oral Q6H PRN Rondel Jumbo, PA-C       Or  . ondansetron (ZOFRAN) injection 4 mg  4 mg Intravenous Q6H PRN Sharene Butters E, PA-C      . oxyCODONE (Oxy IR/ROXICODONE) immediate release tablet 5 mg  5 mg Oral Q6H PRN Jonetta Osgood, MD   5 mg at 10/19/17 0948  . pantoprazole (PROTONIX) EC tablet 40 mg  40 mg Oral Q0600 Rondel Jumbo, PA-C   40 mg at 10/21/17 7544  . polyethylene glycol (MIRALAX / GLYCOLAX) packet 17 g  17 g Oral Daily Jonetta Osgood, MD   17 g at 10/20/17 1030  . predniSONE (DELTASONE) tablet 10 mg  10 mg Oral Q breakfast Rondel Jumbo, PA-C   10 mg at 10/21/17 9201  . senna-docusate (Senokot-S) tablet 1 tablet  1 tablet Oral QHS PRN Rondel Jumbo, PA-C         Discharge Medications: Please see discharge summary for a list of discharge medications.  Relevant Imaging Results:  Relevant Lab Results:   Additional Information SSN: [id number]  Benard Halsted, LCSWA

## 2017-10-21 NOTE — Progress Notes (Signed)
Patient was scheduled for lumbar 1 and lumbar 4 kyphoplasties/vertebroplasties today with Dr. Estanislado Pandy.  While patient was being evaluated prior to procedure by Dr. Estanislado Pandy, she was noted to have a shingles rash, which was crusted over, at site of fractures.  Dr. Estanislado Pandy spoke with patient and husband to inform them that this could be a possible risk of infection, and recommends ID input before proceeding with procedure.  I spoke with Dr. Candiss Norse to notify that procedure will not occur today. Dr. Candiss Norse states he will notify ID.  Plan for ID consultation (to evaluate infection risk when injecting through shingles rash) before progressing with procedure.  All questions answered and concerns addressed. Patient and husband convey understanding and agree with plan.  Bea Graff Mayvis Agudelo, PA-C 10/21/2017, 2:25 PM

## 2017-10-21 NOTE — Consult Note (Signed)
Chief Complaint: Patient was seen in consultation today for Lumbar 1 and Lumbar 4 Kyphoplasty/vertebroplasty Chief Complaint  Patient presents with  . Back Pain   at the request of Dr Ronnie Derby   Supervising Physician: Luanne Bras  Patient Status: [facility] - In-pt  History of Present Illness: Heather Benson is a 84 y.o. female   Denies injury or fall Does work in yard and garden Developed back pain 4-5 weeks ago Pain continued to worsen  Ranks pain 10 of 10 Came to ED for pain control 6/6 meds little to no help  MRI: 6/8:  IMPRESSION: 1. L1 burst fracture demonstrates progressive loss of height and osseous retropulsion compared with CT 2 days ago. This fracture appears acute with marrow edema and probable hemorrhage. 2. Late subacute superior endplate compression fracture at L4 is largely healed with minimal residual marrow edema. This fracture has developed since 12/24/2016. 3. Chronic T12 compression fracture. 4. Multilevel spondylosis as described, without high-grade spinal stenosis or nerve root encroachment.  Request made for L1 and L4 KP Dr Estanislado Pandy has reviewed imaging and approves procedure   Past Medical History:  Diagnosis Date  . Aortic atherosclerosis ([place]) 12/25/2016  . Aortic stenosis, mild 12/14/2015  . Arthritis 08-28-11   right knee pain-surgery planned  . Cancer [facility]) 08-28-11   Melonoma-cheek(many Yrs ago) , recent bx. left  face lesion, past skin cancer lesions  . CAP (community acquired pneumonia) 12/13/2015  . CHF (congestive heart failure) ([place])   . Complication of anesthesia 08-28-11   in past arrythmia-  cardiology has seen in past  . Dysrhythmia 08-28-11   only immed.  to several days after surgery- hx. A.fib. in past  . GERD (gastroesophageal reflux disease) 08-28-11   tx. omeprazole  . Hypercholesterolemia   . Hypertension 08-28-11   tx. meds  . IBS (irritable bowel syndrome)   . Lymphedema   . Neuromuscular disorder ([place])  08-28-11   hx. Polio age 44-slight residual affects legs  . Osteoarthritis    back and left knee  . Osteoporosis   . Pneumonia 12/2015   hospital x 3 days  . PONV (postoperative nausea and vomiting)   . Renal cyst, right   . Sarcoidosis   . Swelling of extremity 08-28-11   bilateral lower extremities- mid calf down    Past Surgical History:  Procedure Laterality Date  . ABDOMINAL HYSTERECTOMY  08-28-11   Partial-vaginal approach  . BREAST REDUCTION SURGERY  1997  . BUNIONECTOMY  08-28-11   right  . CARDIOVERSION N/A 08/05/2014   Procedure: CARDIOVERSION;  Surgeon: Larey Dresser, MD;  Location: [place];  Service: Cardiovascular;  Laterality: N/A;  . CATARACT EXTRACTION, BILATERAL  08-28-11   bilateral  . COLONOSCOPY WITH PROPOFOL N/A 12/24/2016   Procedure: COLONOSCOPY WITH PROPOFOL;  Surgeon: Wonda Horner, MD;  Location: [place] [facility];  Service: Endoscopy;  Laterality: N/A;  . EYE SURGERY     Cataract  . JOINT REPLACEMENT  Oct. 1, 2012   Right Knee  . KNEE ARTHROSCOPY  09/06/2011   Procedure: ARTHROSCOPY KNEE;  Surgeon: Gearlean Alf, MD;  Location: [facility];  Service: Orthopedics;  Laterality: Right;  with Synovectomy    Allergies: Augmentin [amoxicillin-pot clavulanate]; Boniva [ibandronic acid]; Codeine; Hydromorphone; Latex; Lisinopril; and Adhesive [tape]  Medications: Prior to Admission medications   Medication Sig Start Date End Date Taking? Authorizing Provider  acetaminophen (TYLENOL) 500 MG tablet Take 1,000 mg by mouth every 8 (eight) hours as needed (for pain).  Yes [provider]  amiodarone (PACERONE) 200 MG tablet Take 1 tablet (200 mg total) by mouth daily. 07/17/17  Yes Jettie Booze, MD  atorvastatin (LIPITOR) 10 MG tablet Take 1 tablet (10 mg total) by mouth daily. 09/08/17  Yes Jettie Booze, MD  beclomethasone (QVAR) 80 MCG/ACT inhaler Inhale 2 puffs into the lungs 2 (two) times daily as needed (for flares).    Yes [provider]  ELIQUIS 2.5 MG TABS tablet TAKE 1 TABLET BY MOUTH TWICE A DAY 11/26/16  Yes Jettie Booze, MD  fexofenadine (ALLEGRA) 180 MG tablet Take 180 mg by mouth daily as needed for allergies.    Yes [provider]  fluticasone (FLONASE) 50 MCG/ACT nasal spray Place 1 spray into both nostrils daily as needed for allergies.  09/27/13  Yes [provider]  furosemide (LASIX) 40 MG tablet Take 40 mg by mouth See admin instructions. Take 40 mg by mouth once a day and may take a second dose of 40 mg if needed for swelling 12/09/15  Yes [provider]  guaiFENesin (MUCINEX) 600 MG 12 hr tablet Take 600 mg by mouth 2 (two) times daily as needed for cough or to loosen phlegm.    Yes [provider]  HYDROcodone-acetaminophen (NORCO/VICODIN) 5-325 MG tablet Take 0.5-1 tablets by mouth 3 (three) times daily as needed (for pain).    Yes [provider]  latanoprost (XALATAN) 0.005 % ophthalmic solution Place 1 drop into both eyes at bedtime. 08/11/14  Yes [provider]  meclizine (ANTIVERT) 25 MG tablet Take 12.5 mg by mouth 3 (three) times daily as needed for dizziness.  04/01/14  Yes [provider]  potassium chloride (K-DUR) 10 MEQ tablet Take 10 mEq by mouth daily.   Yes [provider]  predniSONE (DELTASONE) 10 MG tablet Take 10 mg by mouth daily with breakfast.   Yes [provider]  Respiratory Therapy Supplies (FLUTTER) DEVI Use as directed. 02/06/16   Marshell Garfinkel, MD     Family History  Problem Relation Age of Onset  . Heart disease Mother        Varicose Veins  . Hyperlipidemia Mother   . Hypertension Mother   . Colon cancer Mother   . Heart disease Father        Heart Disease before age 9  . Hypertension Father   . Emphysema Father   . Breast cancer Sister   . Stroke Maternal Grandmother   . Heart attack Brother     Social History   Socioeconomic History  . Marital status: Married     Spouse name: [name]  . Number of children: 3  . Years of education: 57  . Highest education level: Not on file  Occupational History  . Occupation: Retired- [employer]  . Financial resource strain: Not on file  . Food insecurity:    Worry: Not on file    Inability: Not on file  . Transportation needs:    Medical: Not on file    Non-medical: Not on file  Tobacco Use  . Smoking status: Never Smoker  . Smokeless tobacco: Never Used  Substance and Sexual Activity  . Alcohol use: No    Alcohol/week: 0.0 oz  . Drug use: No  . Sexual activity: Not Currently    Partners: Male  Lifestyle  . Physical activity:    Days per week: Not on file    Minutes per session: Not on  file  . Stress: Not on file  Relationships  . Social connections:    Talks on phone: Not on file    Gets together: Not on file    Attends religious service: Not on file    Active member of club or organization: Not on file    Attends meetings of clubs or organizations: Not on file    Relationship status: Not on file  Other Topics Concern  . Not on file  Social History Narrative   Lives with husband, Hilbert Odor at home   Caffeine use: Drinks coffee/tea/caffeine free soda   OCCUPATION: retired     Review of Systems: A 12 point ROS discussed and pertinent positives are indicated in the HPI above.  All other systems are negative.  Review of Systems  Constitutional: Positive for activity change, appetite change and fatigue. Negative for fever.  Respiratory: Negative for cough and shortness of breath.   Cardiovascular: Negative for chest pain.  Gastrointestinal: Negative for abdominal pain.  Musculoskeletal: Positive for back pain and gait problem.  Neurological: Negative for weakness.  Psychiatric/Behavioral: Negative for behavioral problems and confusion.    Vital Signs: BP (!) 170/70 (BP Location: Right Arm)   Pulse 79   Temp 97.9 F (36.6 C) (Oral)   Resp 18   Ht 5\' 6"  (1.676 m)   Wt 177  lb 11.1 oz (80.6 kg)   SpO2 93%   BMI 28.68 kg/m   Physical Exam  Constitutional: She is oriented to person, place, and time.  Cardiovascular: Normal rate, regular rhythm and normal heart sounds.  Pulmonary/Chest: Effort normal and breath sounds normal.  Abdominal: Soft.  Musculoskeletal: Normal range of motion. She exhibits tenderness.  Low back pain  Neurological: She is alert and oriented to person, place, and time.  Skin: Skin is warm and dry.  Psychiatric: She has a normal mood and affect. Her behavior is normal. Judgment and thought content normal.  Nursing note and vitals reviewed.   Imaging: Dg Chest 2 View  Result Date: 10/15/2017 CLINICAL DATA:  Hypoxia. EXAM: CHEST - 2 VIEW COMPARISON:  12/26/2015 FINDINGS: Cardiomediastinal silhouette is normal. Mediastinal contours appear intact. Calcific atherosclerotic disease and tortuosity of the aorta. There is no evidence of focal airspace consolidation, pleural effusion or pneumothorax. Osseous structures are without acute abnormality. Soft tissues are grossly normal. IMPRESSION: No active cardiopulmonary disease. Calcific atherosclerotic disease and tortuosity of the aorta. Electronically Signed   By: Fidela Salisbury M.D.   On: 10/15/2017 14:36   Ct Lumbar Spine Wo Contrast  Result Date: 10/16/2017 CLINICAL DATA:  Back pain corresponding to shingles for 2 weeks. No injury. History of melanoma. EXAM: CT LUMBAR SPINE WITHOUT CONTRAST TECHNIQUE: Multidetector CT imaging of the lumbar spine was performed without intravenous contrast administration. Multiplanar CT image reconstructions were also generated. COMPARISON:  CT abdomen and pelvis October 15, 2017 FINDINGS: SEGMENTATION: For the purposes of this report the last well-formed intervertebral disc space is reported as L5-S1. ALIGNMENT: Maintained lumbar lordosis. No malalignment. VERTEBRAE: Acute L1 burst fracture, 12 mm in craniocaudad dimension, 15 mm in craniocaudad dimension yesterday.  4 mm L1 retropulsed bony fragments. Subacute appearing L4 compression fracture with approximately 50% height loss, stable from yesterday. Or mm L4 retropulsed bony fragments. Old moderate T12 compression fracture. Remaining lumbar vertebral bodies intact moderate-to-severe L5-S1 disc height loss with vacuum disc and endplate spurring compatible with degenerative disc. Remaining disc heights maintained. Osteopenia without destructive bony lesions. PARASPINAL AND OTHER SOFT TISSUES: Moderate paraspinal muscle atrophy.  Severe calcific atherosclerosis aorta which measures up to 2 cm. Numerous complex renal cysts, incompletely evaluated with small nonobstructing left nephrolithiasis. DISC LEVELS: T11-12: Endplate spurring directed towards the right. No canal stenosis. Mild right neural foraminal narrowing. T12-L1: No disc bulge, canal stenosis nor neural foraminal narrowing. L1-2: Mild canal stenosis at L1 due to retropulsed bony fragments with narrowed lateral recesses which may affect the traversing L1 nurse. No neural foraminal narrowing. L2-3: No disc bulge, canal stenosis nor neural foraminal narrowing. L3-4: Retropulsed bony fragments, mild facet arthropathy. No canal stenosis or neural foraminal narrowing. L4-5: Small broad-based right subarticular disc osteophyte complex. Mild facet arthropathy. No canal stenosis. Mild bilateral neural foraminal narrowing. L5-S1: Right extraforaminal small disc osteophyte complex. No canal stenosis. Mild right neural foraminal narrowing and encroachment upon the exiting right L5 nerve. IMPRESSION: 1. Acute L1 burst fracture with 20% further height loss from yesterday's CT with further retropulsion. Mild canal stenosis at L1. 2. Subacute appearing moderate L4 burst fracture with similar retropulsed bony fragments. Old moderate T12 compression fracture. 3. Mild neural foraminal narrowing T11-12, L4-5 and L5-S1. 4. These results will be called to the ordering clinician or  representative by the Radiologist Assistant, and communication documented in the PACS or zVision Dashboard. Aortic Atherosclerosis (ICD10-I70.0). Electronically Signed   By: Elon Alas M.D.   On: 10/16/2017 14:05   Mr Lumbar Spine Wo Contrast  Result Date: 10/18/2017 CLINICAL DATA:  L1 fracture.  Evaluation for spinal augmentation. EXAM: MRI LUMBAR SPINE WITHOUT CONTRAST TECHNIQUE: Multiplanar, multisequence MR imaging of the lumbar spine was performed. No intravenous contrast was administered. COMPARISON:  Abdominopelvic CT 10/15/2017 and 12/24/2016. Lumbar spine CT 10/16/2017 FINDINGS: Despite efforts by the technologist and patient, mild motion artifact is present on today's exam and could not be eliminated. This reduces exam sensitivity and specificity. Motion is greatest on the sagittal T2 weighted images. Segmentation: Conventional anatomy assumed, with the last open disc space designated L5-S1.This is concordant with previous imaging. Alignment:  Stable. Vertebrae: There is progressive loss of height at the acute L1 burst fracture now measuring 10 mm craniocaudal, previously 12 mm. There is approximately 8 mm of osseous retropulsion. There is marrow edema throughout the L1 vertebral body with probable linear hemorrhage in the horizontal plane. The posterior elements are intact. There are no other acute fractures. There is a mild superior endplate compression fracture at L4 which is new from 2018, although largely healed with minimal subchondral edema. Chronic T12 fracture is healed without residual edema. The visualized sacroiliac joints appear unremarkable. Paraspinal and other soft tissues: No paraspinal hematoma demonstrated. There are bilateral renal cysts. Disc levels: T11-12: Disc bulging with endplate osteophytes asymmetric to the right. No cord deformity or foraminal compromise. T12-L1: No significant findings. L1-2: The retropulsed bone exerts some mass effect on the thecal sac, but no cord  deformity. At the level of the disc, there is no spinal stenosis or nerve root encroachment. L2-3: Mild disc bulging. No spinal stenosis or nerve root encroachment. L3-4: Annular disc bulging, mild osseous retropulsion from the L4 fracture and facet/ligamentous hypertrophy contribute to mild spinal stenosis. The lateral recesses and foramina are patent. L4-5: Stable disc bulging, facet and ligamentous hypertrophy. No nerve root encroachment. L5-S1: Stable chronic degenerative disc disease with endplate osteophytes and mild facet hypertrophy. Mild inferior foraminal narrowing without definite nerve root encroachment. IMPRESSION: 1. L1 burst fracture demonstrates progressive loss of height and osseous retropulsion compared with CT 2 days ago. This fracture appears acute with marrow edema and  probable hemorrhage. 2. Late subacute superior endplate compression fracture at L4 is largely healed with minimal residual marrow edema. This fracture has developed since 12/24/2016. 3. Chronic T12 compression fracture. 4. Multilevel spondylosis as described, without high-grade spinal stenosis or nerve root encroachment. Electronically Signed   By: Richardean Sale M.D.   On: 10/18/2017 17:15   Ct Renal Stone Study  Result Date: 10/15/2017 CLINICAL DATA:  Flank pain, stone disease suspected. Shingles. Symptoms for 1 week. Symptoms are worse today. EXAM: CT ABDOMEN AND PELVIS WITHOUT CONTRAST TECHNIQUE: Multidetector CT imaging of the abdomen and pelvis was performed following the standard protocol without IV contrast. COMPARISON:  CT of the abdomen and pelvis 12/24/2016. FINDINGS: Lower chest: Mild dependent atelectasis is present at both lung bases, right greater than left. No significant consolidation is present. There is no nodule or mass lesion. Coronary artery calcifications are present. Extensive mitral annular calcifications are present. No significant pleural or pericardial effusion is present. Hepatobiliary: No focal  liver abnormality is seen. No gallstones, gallbladder wall thickening, or biliary dilatation. Pancreas: Unremarkable. No pancreatic ductal dilatation or surrounding inflammatory changes. Spleen: The spleen is within normal limits. Adrenals/Urinary Tract: Adrenals are within normal limits. Multiple exophytic lesions are again seen bilaterally. Right-sided lesions measure water density. 2 upper pole lesions posteriorly on the left measure slightly higher density. The larger measures 2.1 cm, decreased since the prior exam. Ureters are within normal limits. The urinary bladder is unremarkable. No stones are present. Stomach/Bowel: The stomach and duodenum are within normal limits for the small bowel is unremarkable. Terminal ileum is within normal limits. The appendix is visualized and normal. Ascending colon is normal transverse colon and descending colon are within normal limits. Diverticular changes are present within the sigmoid colon. There is wall thickening with minimal stranding. No fluid collection or free air is present. The rectum is within normal limits. Vascular/Lymphatic: Atherosclerotic calcifications are present in the aorta and branch vessels without aneurysm. No significant adenopathy is present. Reproductive: Status post hysterectomy. No adnexal masses. Other: No abdominal wall hernia or abnormality. No abdominopelvic ascites. Musculoskeletal: L1 comminuted vertebral body fracture is present. There is sparing of the posterior elements. Fracture involves predominantly the inferior endplate. A remote superior endplate fracture at C14 is stable. Acute/subacute superior endplate fractures present at L4. Bone fragments are retropulsed 5 mm into the canal. Vertebral body heights at L2, L3, and L5 are preserved. IMPRESSION: 1. Diverticular disease with wall thickening and slight stranding in the sigmoid colon discerning for developing acute diverticulitis. 2. Acute/subacute vertebral body fractures at L1 and  L4. Retropulsed bone at L4 extends 5 mm into the canal. 3.  Aortic Atherosclerosis (ICD10-I70.0).  No aneurysm is present. 4. Multiple bilateral exophytic lesions of both kidneys. Majority of these lesions are cystic. Some increase in size. These are incompletely evaluated without IV contrast. Given the interval growth, MRI of the abdomen without and with contrast is recommended. Electronically Signed   By: San Morelle M.D.   On: 10/15/2017 15:01    Labs:  CBC: Recent Labs    10/18/17 0809 10/19/17 0522 10/20/17 0246 10/21/17 0610  WBC 9.9 10.3 11.7* 10.5  HGB 15.1* 15.2* 16.0* 16.3*  HCT 49.2* 48.9* 51.7* 52.8*  PLT 160 176 187 193    COAGS: Recent Labs    12/22/16 0230  10/17/17 0602  10/18/17 1938 10/19/17 0522 10/19/17 1156 10/20/17 0246  INR 1.11  --  1.40  --   --   --   --   --  APTT 27   < > 76*   < > 55* 73* 74* 71*   < > = values in this interval not displayed.    BMP: Recent Labs    10/15/17 1055 10/16/17 0718 10/17/17 0602 10/20/17 0246  NA 136 136 140 143  K 4.8 5.5* 3.7 4.5  CL 95* 97* 104 103  CO2 24 27 28 28   GLUCOSE 124* 88 106* 98  BUN 37* 35* 31* 22*  CALCIUM 10.0 9.3 8.9 9.6  CREATININE 2.67* 2.43* 2.22* 1.80*  GFRNONAA 15* 17* 19* 25*  GFRAA 18* 20* 22* 29*    LIVER FUNCTION TESTS: Recent Labs    12/11/16 1335 12/22/16 0230 09/02/17 0852  BILITOT 1.2 0.7 0.6  AST 18 23 21   ALT 16 20 24   ALKPHOS 37* 47 122*  PROT 5.8* 6.4* 6.7  ALBUMIN 2.8* 3.1* 4.0    TUMOR MARKERS: No results for input(s): AFPTM, CEA, CA199, CHROMGRNA in the last 8760 hours.  Assessment and Plan:  New Lumbar 1 and 4 painful fractures Worsening pain meds no relief Now scheduled for vertebral augmentation Risks and benefits of kyphoplasty/vertebroplasty were discussed with the patient including, but not limited to education regarding the natural healing process of compression fractures without intervention, bleeding, infection, cement migration  which may cause spinal cord damage, paralysis, pulmonary embolism or even death.  This interventional procedure involves the use of X-rays and because of the nature of the planned procedure, it is possible that we will have prolonged use of X-ray fluoroscopy.  Potential radiation risks to you include (but are not limited to) the following: - A slightly elevated risk for cancer  several years later in life. This risk is typically less than 0.5% percent. This risk is low in comparison to the normal incidence of human cancer, which is 33% for women and 50% for men according to the [place]. - Radiation induced injury can include skin redness, resembling a rash, tissue breakdown / ulcers and hair loss (which can be temporary or permanent).   The likelihood of either of these occurring depends on the difficulty of the procedure and whether you are sensitive to radiation due to previous procedures, disease, or genetic conditions.   IF your procedure requires a prolonged use of radiation, you will be notified and given written instructions for further action.  It is your responsibility to monitor the irradiated area for the 2 weeks following the procedure and to notify your physician if you are concerned that you have suffered a radiation induced injury.    All of the patient's questions were answered, patient is agreeable to proceed.  Consent signed and in chart.  Thank you for this interesting consult.  I greatly enjoyed meeting Heather Benson and look forward to participating in their care.  A copy of this report was sent to the requesting provider on this date.  Electronically Signed: Lavonia Drafts, PA-C 10/21/2017, 9:19 AM   I spent a total of 40 Minutes    in face to face in clinical consultation, greater than 50% of which was counseling/coordinating care for L1 and L4 KP

## 2017-10-21 NOTE — Consult Note (Addendum)
[facility] for Infectious Disease    Date of Admission:  10/15/2017     Reason for Consult: Shingles rash     Referring Provider: Candiss Norse Primary Care Provider: [name], MD   Assessment: Heather Benson is a 84 y.o. very pleasant woman that has been troubled with back pain for a while now and was scheduled to undergo a kyphoplasty in IR; however it was requested we look at the shingles rash as there was concern for transmitting infection. Her initial rash presented 5 weeks ago and presently there are absolutely no vesicles that would indicate active/contagious infection. There is hyperemia where the rash was previously but this is all completely flat and smooth on the posterior aspect of the rash where injections would need to occur. The only residual crusts are located on the left flank/lower abdomen and even these are flaking off with smooth skin underneath.   I do not think she is at a higher risk for infection due to this outside what is normally expected with the procedure itself. Would not recommend any additional measures to prep skin. Recommend proceeding as planned. We discussed the new Shingrex vaccine and I recommend she consider this vaccine once she is feeling better from her surgery to help reduce outbreaks and post herpetic neuralgia.   Plan: 1. OK to proceed with kyphoplasty  2. Follow up with primary care team for consideration of Shingrex vaccine    Principal Problem:   Lumbar pain Active Problems:   PAF (paroxysmal atrial fibrillation) (HCC)   CKD (chronic kidney disease), stage IV (HCC)   Essential hypertension   Anemia in chronic renal disease   Congestive heart failure ([place])   History of herpes zoster   Acute diverticulitis   Diverticulitis   Acute renal failure ([place])   . acetaminophen  1,000 mg Oral Q6H  . amiodarone  200 mg Oral Daily  . atorvastatin  10 mg Oral Daily  . calcitonin (salmon)  1 spray Alternating Nares Daily  .  furosemide  20 mg Oral Daily  . gabapentin  100 mg Oral QHS  . latanoprost  1 drop Both Eyes QHS  . lidocaine  1 patch Transdermal Q24H  . magnesium hydroxide  30 mL Oral Once  . pantoprazole  40 mg Oral Q0600  . polyethylene glycol  17 g Oral Daily  . predniSONE  10 mg Oral Q breakfast    HPI: Heather Benson is a 84 y.o. female admitted for worsening back pain and left sided abdominal pain. At the time of admission she reported to have had shingles for over 3 weeks to the left flank/lower abdomen. During evaluation determined she had acute L1 fracture and possibly T12 and L4. Kyphoplasty was recommended for pain control however this was delayed d/t Eliquis. She was transitioned off eliquis and placed on heparin gtt for afib anticoagulation.   In discission with Ms. Morsch, her husband and her son today they tell me that she first presented with the rash 5 weeks ago. This is her 3rd episode with shingles. She did not take any antiviral medication for this only pain medications. It has completely crusted over and there have not been any new vesicles present in well over 3 weeks from their report and everything has crusted over. Some residual pain/itching along the rash. She had no fevers at any point during the course of outbreak but may have had some subjective chills. Had the zostavax "years ago."  Review of Systems: Review of Systems  Constitutional: Negative for chills, fever, malaise/fatigue and weight loss.  HENT: Negative for sore throat.   Respiratory: Negative for cough and sputum production.   Cardiovascular: Negative for chest pain and leg swelling.  Gastrointestinal: Negative for abdominal pain, diarrhea and vomiting.  Genitourinary: Negative for dysuria and flank pain.  Musculoskeletal: Positive for back pain. Negative for joint pain, myalgias and neck pain.  Skin: Positive for rash.  Neurological: Negative for dizziness, tingling and headaches.  Psychiatric/Behavioral:  Negative for depression and substance abuse. The patient is not nervous/anxious and does not have insomnia.     Past Medical History:  Diagnosis Date  . Aortic atherosclerosis ([place]) 12/25/2016  . Aortic stenosis, mild 12/14/2015  . Arthritis 08-28-11   right knee pain-surgery planned  . Cancer [facility]) 08-28-11   Melonoma-cheek(many Yrs ago) , recent bx. left  face lesion, past skin cancer lesions  . CAP (community acquired pneumonia) 12/13/2015  . CHF (congestive heart failure) ([place])   . Complication of anesthesia 08-28-11   in past arrythmia-  cardiology has seen in past  . Dysrhythmia 08-28-11   only immed.  to several days after surgery- hx. A.fib. in past  . GERD (gastroesophageal reflux disease) 08-28-11   tx. omeprazole  . Hypercholesterolemia   . Hypertension 08-28-11   tx. meds  . IBS (irritable bowel syndrome)   . Lymphedema   . Neuromuscular disorder ([place]) 08-28-11   hx. Polio age 49-slight residual affects legs  . Osteoarthritis    back and left knee  . Osteoporosis   . Pneumonia 12/2015   hospital x 3 days  . PONV (postoperative nausea and vomiting)   . Renal cyst, right   . Sarcoidosis   . Swelling of extremity 08-28-11   bilateral lower extremities- mid calf down    Social History   Tobacco Use  . Smoking status: Never Smoker  . Smokeless tobacco: Never Used  Substance Use Topics  . Alcohol use: No    Alcohol/week: 0.0 oz  . Drug use: No    Family History  Problem Relation Age of Onset  . Heart disease Mother        Varicose Veins  . Hyperlipidemia Mother   . Hypertension Mother   . Colon cancer Mother   . Heart disease Father        Heart Disease before age 37  . Hypertension Father   . Emphysema Father   . Breast cancer Sister   . Stroke Maternal Grandmother   . Heart attack Brother    Allergies  Allergen Reactions  . Augmentin [Amoxicillin-Pot Clavulanate] Diarrhea and Nausea Only  . Boniva [Ibandronic Acid] Diarrhea  . Codeine Other (See  Comments)    "Spaced out feeling" and Lightheadedness  . Hydromorphone Nausea And Vomiting  . Latex Rash    "Burns me"  . Lisinopril Cough  . Adhesive [Tape] Itching and Rash    OBJECTIVE: Blood pressure (!) 170/70, pulse 79, temperature 97.9 F (36.6 C), temperature source Oral, resp. rate 18, height 5\' 6"  (1.676 m), weight 177 lb 11.1 oz (80.6 kg), SpO2 93 %.  Physical Exam  Constitutional: She is oriented to person, place, and time. She appears well-developed and well-nourished.  Resting quietly in bed visibly uncomfortable.   HENT:  Mouth/Throat: Mucous membranes are normal. No oral lesions. Normal dentition. No dental abscesses. No oropharyngeal exudate.  Cardiovascular: Normal rate, regular rhythm and normal heart sounds.  Pulmonary/Chest: Effort normal and breath sounds normal.  Abdominal: Soft. She exhibits no distension. There is no tenderness.  Musculoskeletal: She exhibits tenderness (lumbar ).  Lymphadenopathy:    She has no cervical adenopathy.  Neurological: She is alert and oriented to person, place, and time.  Skin: Skin is warm and dry. No rash noted.  Hyperemic smoothe rash with scattered dry crusts along left lower thoracolumbar spine wrapping around left flank to left lower abdomen. No vesicles present.        Lab Results Lab Results  Component Value Date   WBC 10.5 10/21/2017   HGB 16.3 (H) 10/21/2017   HCT 52.8 (H) 10/21/2017   MCV 95.7 10/21/2017   PLT 193 10/21/2017    Lab Results  Component Value Date   CREATININE 1.80 (H) 10/20/2017   BUN 22 (H) 10/20/2017   NA 143 10/20/2017   K 4.5 10/20/2017   CL 103 10/20/2017   CO2 28 10/20/2017    Lab Results  Component Value Date   ALT 24 09/02/2017   AST 21 09/02/2017   ALKPHOS 122 (H) 09/02/2017   BILITOT 0.6 09/02/2017     Microbiology: Recent Results (from the past 240 hour(s))  Surgical PCR screen     Status: None   Collection Time: 10/21/17 12:02 PM  Result Value Ref Range Status    MRSA, PCR NEGATIVE NEGATIVE Final   Staphylococcus aureus NEGATIVE NEGATIVE Final    Comment: (NOTE) The Xpert SA Assay (FDA approved for NASAL specimens in patients 55 years of age and older), is one component of a comprehensive surveillance program. It is not intended to diagnose infection nor to guide or monitor treatment. Performed at [facility], [place] [address] [place], [place] [address]     Janene Madeira, MSN, NP-C [facility] for Infectious [place] Cell: [phone] Pager: [phone]  10/21/2017 2:47 PM

## 2017-10-21 NOTE — Progress Notes (Signed)
PT Cancellation Note  Patient Details Name: Heather Benson MRN: [id number] DOB: 02/07/1932   Cancelled Treatment:    Reason Eval/Treat Not Completed: Patient at procedure or test/unavailable   Lanney Gins, PT, DPT 10/21/17 3:04 PM

## 2017-10-21 NOTE — Progress Notes (Signed)
Patient was scheduled for lumbar 1 and lumbar 4 kyphoplasties/vertebroplasties today with Dr. Estanislado Pandy.  ID evaluated patient and states it is ok to proceed with Kyphoplasty- informed Dr. Estanislado Pandy of this update.  Went to see patient on floor. Informed patient of ID clearance and that procedure, per Dr. Estanislado Pandy, will occur tomorrow.  Will change diet so patient can eat today. Will make patient NPO at midnight.  All questions answered and concerns addressed. Patient and family members convey understanding and agree with plan.  Bea Graff Cielo Arias, PA-C 10/21/2017, 3:41 PM

## 2017-10-21 NOTE — Progress Notes (Signed)
OT Cancellation Note  Patient Details Name: Heather Benson MRN: [id number] DOB: [birth year]   Cancelled Treatment:    Reason Eval/Treat Not Completed: Patient at procedure or test/ unavailable  [place]. Ulice Brilliant, M.S., OTR/L Acute Rehab Department: [phone] 10/21/2017, 2:24 PM

## 2017-10-21 NOTE — Progress Notes (Signed)
Attending MD note  Patient was seen, examined,treatment plan was discussed with the NP.  I have personally reviewed the clinical findings, lab, imaging studies and management of this patient in detail. I agree with the documentation, as recorded by the NP.   Subjective: Continues to have severe low back pain-8/10  On Exam: Vitals:   10/20/17 1519 10/20/17 2113 10/21/17 0500 10/21/17 0532  BP: (!) 118/51 109/78  (!) 170/70  Pulse: 68 71  79  Resp: 16 18  18   Temp:  97.9 F (36.6 C)  97.9 F (36.6 C)  TempSrc:    Oral  SpO2: 95% 94%  93%  Weight:   80.6 kg (177 lb 11.1 oz)   Height:       Gen. exam: Awake, alert, not in any distress Chest: Good air entry bilaterally, no rhonchi or rales CVS: S1-S2 regular, no murmurs Abdomen: Soft, nontender and nondistended Neurology: Has adequate lower extremity strength-5/5, sensation is grossly intact.   Skin: No rash or lesions   Lab Data: CBC: Recent Labs  Lab 10/17/17 0602 10/18/17 0809 10/19/17 0522 10/20/17 0246 10/21/17 0610  WBC 9.6 9.9 10.3 11.7* 10.5  HGB 15.9* 15.1* 15.2* 16.0* 16.3*  HCT 50.8* 49.2* 48.9* 51.7* 52.8*  MCV 95.1 95.9 96.6 97.2 95.7  PLT 163 160 176 187 952    Basic Metabolic Panel: Recent Labs  Lab 10/15/17 1055 10/16/17 0718 10/17/17 0602 10/20/17 0246  NA 136 136 140 143  K 4.8 5.5* 3.7 4.5  CL 95* 97* 104 103  CO2 24 27 28 28   GLUCOSE 124* 88 106* 98  BUN 37* 35* 31* 22*  CREATININE 2.67* 2.43* 2.22* 1.80*  CALCIUM 10.0 9.3 8.9 9.6   Assessment and plan: Intractable low back pain secondary to acute L1 compression fracture: Pain continues to be intractable-continue with scheduled Tylenol, as needed oxycodone for moderate pain and Dilaudid for severe pain.  Have added nasal calcitonin for pain as well.  Appreciate Dr. Rolena Infante input-have consulted IR for kyphoplasty.  Her last dose of Eliquis was on 6/6-did speak with Dr. Sabas Sous over the phone yesterday-patient will need to be off Eliquis for  at least 48 hours before proceeding with a kyphoplasty.  Currently on IV heparin.  Acute diverticulitis: Improved-minimal left lower quadrant pain today-continue Rocephin/Flagyl.  Acute kidney injury on chronic kidney disease stage IV: Likely hemodynamically mediated-creatinine improving with supportive care.  Avoid nephrotoxic agents.  Atrial fibrillation: Rate controlled with amiodarone-off Eliquis-since her chads2Va score is around 5-aware starting IV heparin.  Chronic prednisone use: Patient not sure why she is on 10 mg of prednisone-however this is being continued  Rest as below  [facility]       PROGRESS NOTE    TUWANA KAPAUN  WUX:[id number] DOB: 1932/01/21 DOA: 10/15/2017 PCP: [name], MD   Brief Narrative: Patient is a 84 year old female with prior history of A. fib on Eliquis, hypertension, dyslipidemia, chronic kidney disease stage IV, recent herpes zoster involving her left flank area presented with worsening back pain for several weeks, a CT scan of the abdomen showed L1 compression fracture, it also showed probable left-sided diverticulitis.  She was subsequently started on empiric antimicrobial therapy and admitted to the hospitalist service. Of note she was seen by her orthopedic surgeon for the back pain 4 weeks ago and received a steroid injection.  Dr. Rolena Infante [facility] orthopedics)was consulted, patient underwent a lumbar CT scan on 6/6-which showed a burst L1 fracture-recommendations from orthopedics are  to proceed with a kyphoplasty.  Unfortunately patient's last dose of Eliquis was morning of 6/6-and will need to be off Eliquis for 48 hours before hypoplasty can be done.  Interventional radiology has been consulted.  See below for further details  Subjective: Patient in bed, appears comfortable, denies any headache, no fever, no chest pain or pressure, no shortness of breath , no abdominal pain. No focal weakness.  Interested in low  back pain upon bearing weight or standing up intense low back pain.  Currently not radiating to her legs, no leg weakness.  No incontinence.    Assessment & Plan:  Low back pain in the setting of recent shingles and history L1 and L4 fracture: Lumbar CT shows acute L1 burst fracture, seen by orthopedics Dr. Rolena Infante who recommended IR consultation which was obtained through Dr. Estanislado Pandy, patient is awaiting kyphoplasty, last dose of Eliquis was 10/16/2017 since then on heparin bridge.  Due to IR scheduling kyphoplasty was postponed for the last few days, likely will happen later on 10/21/2017 thereafter PT and likely discharge tomorrow if stable.   Recent herpes zoster left flank area: Lesion have crusted over, continue low dose Neurontin for additional pain management and continue to monior for secondary infcetions  Acute diverticulitis: Incidentally found on renal CT scan-but patient does have mild left lower quadrant pain, he has been adequately treated with antibiotics which I will stop on 10/20/2017 as her symptoms have completely resolved and she has no abdominal tenderness or diarrhea at all.  Acute on Chronic CKD stage 4:  Creatinine 2.2, 9 IV fluids stopped placed on home dose Lasix.  Hyperkalemia: Potassium level 3.4 this am with know CKD stage 4, treated with Veltassa yesterday for potassium 5.5, continue to monitor labs daily   Atrial Fibrillation: Rate remains controlled on Pacerone, Eliquis on hod for possible surgery-since her CHADS2VASC score is around 5-she has been started on IV Heparin while off Eliquis  Hyperlipidemia: Continue home statin   Hypertension: Controlled, continue home Lasix, PRN Hydralazine as needed  dose adjusted.  ?Chronic steroid use: On 10 mg of prednisone in the outpatient setting-per patient this was started by her nephrologist Dr. Lorrene Reid.   DVT prophylaxis: Heparin drip  Code Status: DNR Family Communication:  Husband bedside on  10/21/2017 Disposition Plan: Remain inpatient-will require a few mores days of treatment before medically ready for discharge   Consultants:    [place]   Interventional Radiology   Procedures:   None  . acetaminophen  1,000 mg Oral Q6H  . amiodarone  200 mg Oral Daily  . atorvastatin  10 mg Oral Daily  . calcitonin (salmon)  1 spray Alternating Nares Daily  . furosemide  20 mg Oral Daily  . gabapentin  100 mg Oral QHS  . latanoprost  1 drop Both Eyes QHS  . lidocaine  1 patch Transdermal Q24H  . magnesium hydroxide  30 mL Oral Once  . pantoprazole  40 mg Oral Q0600  . polyethylene glycol  17 g Oral Daily  . predniSONE  10 mg Oral Q breakfast   . sodium chloride 50 mL/hr at 10/15/17 1657    Anti-infectives (From admission, onward)   Start     Dose/Rate Route Frequency Ordered Stop   10/17/17 0845  metroNIDAZOLE (FLAGYL) tablet 500 mg     500 mg Oral Every 8 hours 10/17/17 0833 10/20/17 2159   10/16/17 1700  ciprofloxacin (CIPRO) IVPB 400 mg  Status:  Discontinued     400 mg  200 mL/hr over 60 Minutes Intravenous Every 24 hours 10/15/17 1723 10/16/17 0849   10/16/17 1000  cefTRIAXone (ROCEPHIN) 1 g in sodium chloride 0.9 % 100 mL IVPB     1 g 200 mL/hr over 30 Minutes Intravenous Every 24 hours 10/16/17 0850 10/20/17 1100   10/16/17 0200  metroNIDAZOLE (FLAGYL) IVPB 500 mg  Status:  Discontinued     500 mg 100 mL/hr over 60 Minutes Intravenous Every 8 hours 10/15/17 1704 10/17/17 0832   10/15/17 1545  ciprofloxacin (CIPRO) IVPB 400 mg     400 mg 200 mL/hr over 60 Minutes Intravenous  Once 10/15/17 1534 10/15/17 1758   10/15/17 1545  metroNIDAZOLE (FLAGYL) IVPB 500 mg     500 mg 100 mL/hr over 60 Minutes Intravenous  Once 10/15/17 1534 10/15/17 2000        Objective: Vitals:   10/20/17 1519 10/20/17 2113 10/21/17 0500 10/21/17 0532  BP: (!) 118/51 109/78  (!) 170/70  Pulse: 68 71  79  Resp: 16 18  18   Temp:  97.9 F (36.6 C)  97.9 F (36.6 C)   TempSrc:    Oral  SpO2: 95% 94%  93%  Weight:   80.6 kg (177 lb 11.1 oz)   Height:        Intake/Output Summary (Last 24 hours) at 10/21/2017 1114 Last data filed at 10/21/2017 1101 Gross per 24 hour  Intake 0 ml  Output 450 ml  Net -450 ml   Filed Weights   10/19/17 0214 10/20/17 0500 10/21/17 0500  Weight: 80.9 kg (178 lb 5.6 oz) 80.5 kg (177 lb 7.5 oz) 80.6 kg (177 lb 11.1 oz)    Examination:  Awake Alert, Oriented X 3, No new F.N deficits, Normal affect [place].AT,PERRAL Supple Neck,No JVD, No cervical lymphadenopathy appriciated.  Symmetrical Chest wall movement, Good air movement bilaterally, CTAB RRR,No Gallops, Rubs or new Murmurs, No Parasternal Heave +ve B.Sounds, Abd Soft, No tenderness, No organomegaly appriciated, No rebound - guarding or rigidity. No Cyanosis, Clubbing or edema, No new Rash or bruise    Data Reviewed: I have personally reviewed following labs and imaging studies  CBC: Recent Labs  Lab 10/17/17 0602 10/18/17 0809 10/19/17 0522 10/20/17 0246 10/21/17 0610  WBC 9.6 9.9 10.3 11.7* 10.5  HGB 15.9* 15.1* 15.2* 16.0* 16.3*  HCT 50.8* 49.2* 48.9* 51.7* 52.8*  MCV 95.1 95.9 96.6 97.2 95.7  PLT 163 160 176 187 370   Basic Metabolic Panel: Recent Labs  Lab 10/15/17 1055 10/16/17 0718 10/17/17 0602 10/20/17 0246  NA 136 136 140 143  K 4.8 5.5* 3.7 4.5  CL 95* 97* 104 103  CO2 24 27 28 28   GLUCOSE 124* 88 106* 98  BUN 37* 35* 31* 22*  CREATININE 2.67* 2.43* 2.22* 1.80*  CALCIUM 10.0 9.3 8.9 9.6   GFR: Estimated Creatinine Clearance: 25.3 mL/min (A) (by C-G formula based on SCr of 1.8 mg/dL (H)). Liver Function Tests: No results for input(s): AST, ALT, ALKPHOS, BILITOT, PROT, ALBUMIN in the last 168 hours. No results for input(s): LIPASE, AMYLASE in the last 168 hours. No results for input(s): AMMONIA in the last 168 hours. Coagulation Profile: Recent Labs  Lab 10/17/17 0602 10/21/17 1016  INR 1.40 1.04   Cardiac Enzymes: No  results for input(s): CKTOTAL, CKMB, CKMBINDEX, TROPONINI in the last 168 hours. BNP (last 3 results) No results for input(s): PROBNP in the last 8760 hours. HbA1C: No results for input(s): HGBA1C in the last 72 hours. CBG: No results  for input(s): GLUCAP in the last 168 hours. Lipid Profile: No results for input(s): CHOL, HDL, LDLCALC, TRIG, CHOLHDL, LDLDIRECT in the last 72 hours. Thyroid Function Tests: No results for input(s): TSH, T4TOTAL, FREET4, T3FREE, THYROIDAB in the last 72 hours. Anemia Panel: No results for input(s): VITAMINB12, FOLATE, FERRITIN, TIBC, IRON, RETICCTPCT in the last 72 hours. Urine analysis:    Component Value Date/Time   COLORURINE YELLOW 10/16/2017 2117   APPEARANCEUR CLEAR 10/16/2017 2117   APPEARANCEUR Clear 07/17/2017 1354   LABSPEC 1.011 10/16/2017 2117   PHURINE 5.0 10/16/2017 2117   GLUCOSEU NEGATIVE 10/16/2017 2117   HGBUR MODERATE (A) 10/16/2017 2117   BILIRUBINUR NEGATIVE 10/16/2017 2117   BILIRUBINUR Negative 07/17/2017 1354   KETONESUR NEGATIVE 10/16/2017 2117   PROTEINUR NEGATIVE 10/16/2017 2117   UROBILINOGEN 0.2 02/05/2011 0915   NITRITE NEGATIVE 10/16/2017 2117   LEUKOCYTESUR NEGATIVE 10/16/2017 2117   LEUKOCYTESUR Negative 07/17/2017 1354   Sepsis Labs: @LABRCNTIP (procalcitonin:4,lacticidven:4)  )No results found for this or any previous visit (from the past 240 hour(s)).   Radiology Studies: No results found.   LOS: 6 days    Time spent:   30 minutes-Greater than 50% of this time was spent in counseling, explanation of diagnosis, planning of further management, and coordination of care.   Signature  Lala Lund M.D on 10/21/2017 at 11:14 AM  Between 7am to 7pm - Pager - [phone] ( page via [email or web address], text pages only, please mention full 10 digit call back number).  After 7pm go to [email or web address] - password [facility]

## 2017-10-22 ENCOUNTER — Inpatient Hospital Stay (HOSPITAL_COMMUNITY): Payer: Medicare Other

## 2017-10-22 DIAGNOSIS — K5792 Diverticulitis of intestine, part unspecified, without perforation or abscess without bleeding: Secondary | ICD-10-CM

## 2017-10-22 DIAGNOSIS — N184 Chronic kidney disease, stage 4 (severe): Secondary | ICD-10-CM

## 2017-10-22 DIAGNOSIS — S32010A Wedge compression fracture of first lumbar vertebra, initial encounter for closed fracture: Secondary | ICD-10-CM

## 2017-10-22 HISTORY — PX: IR VERTEBROPLASTY EA ADDL (T&LS) BX INC UNI/BIL INC INJECT/IMAGING: IMG5517

## 2017-10-22 HISTORY — PX: IR VERTEBROPLASTY LUMBAR BX INC UNI/BIL INC/INJECT/IMAGING: IMG5516

## 2017-10-22 LAB — CBC
HCT: 53.3 % — ABNORMAL HIGH (ref 36.0–46.0)
HEMOGLOBIN: 16.5 g/dL — AB (ref 12.0–15.0)
MCH: 29.8 pg (ref 26.0–34.0)
MCHC: 31 g/dL (ref 30.0–36.0)
MCV: 96.4 fL (ref 78.0–100.0)
PLATELETS: 198 10*3/uL (ref 150–400)
RBC: 5.53 MIL/uL — AB (ref 3.87–5.11)
RDW: 17.2 % — ABNORMAL HIGH (ref 11.5–15.5)
WBC: 10 10*3/uL (ref 4.0–10.5)

## 2017-10-22 LAB — BASIC METABOLIC PANEL
ANION GAP: 11 (ref 5–15)
BUN: 29 mg/dL — AB (ref 6–20)
CO2: 29 mmol/L (ref 22–32)
Calcium: 9.7 mg/dL (ref 8.9–10.3)
Chloride: 103 mmol/L (ref 101–111)
Creatinine, Ser: 2.02 mg/dL — ABNORMAL HIGH (ref 0.44–1.00)
GFR calc Af Amer: 25 mL/min — ABNORMAL LOW (ref >60)
GFR calc non Af Amer: 22 mL/min — ABNORMAL LOW (ref >60)
Glucose, Bld: 90 mg/dL (ref 65–99)
POTASSIUM: 3.9 mmol/L (ref 3.5–5.1)
SODIUM: 143 mmol/L (ref 135–145)

## 2017-10-22 MED ORDER — MIDAZOLAM HCL 2 MG/2ML IJ SOLN
INTRAMUSCULAR | Status: AC
  Filled 2017-10-22: qty 2

## 2017-10-22 MED ORDER — CEFAZOLIN SODIUM-DEXTROSE 2-4 GM/100ML-% IV SOLN
INTRAVENOUS | Status: AC
  Filled 2017-10-22: qty 100

## 2017-10-22 MED ORDER — FENTANYL CITRATE (PF) 100 MCG/2ML IJ SOLN
INTRAMUSCULAR | Status: AC | PRN
  Administered 2017-10-22 (×2): 25 ug via INTRAVENOUS

## 2017-10-22 MED ORDER — GELATIN ABSORBABLE 12-7 MM EX MISC
CUTANEOUS | Status: AC
  Filled 2017-10-22: qty 1

## 2017-10-22 MED ORDER — BUPIVACAINE HCL (PF) 0.5 % IJ SOLN
INTRAMUSCULAR | Status: AC
  Filled 2017-10-22: qty 30

## 2017-10-22 MED ORDER — FENTANYL CITRATE (PF) 100 MCG/2ML IJ SOLN
INTRAMUSCULAR | Status: AC
  Filled 2017-10-22: qty 2

## 2017-10-22 MED ORDER — IOPAMIDOL (ISOVUE-300) INJECTION 61%
INTRAVENOUS | Status: AC
  Administered 2017-10-22: 15 mL
  Filled 2017-10-22: qty 50

## 2017-10-22 MED ORDER — SODIUM CHLORIDE 0.9 % IV SOLN
INTRAVENOUS | Status: AC
  Administered 2017-10-22: 18:00:00 via INTRAVENOUS

## 2017-10-22 MED ORDER — TOBRAMYCIN SULFATE 1.2 G IJ SOLR
INTRAMUSCULAR | Status: AC
  Filled 2017-10-22: qty 1.2

## 2017-10-22 MED ORDER — HEPARIN (PORCINE) IN NACL 100-0.45 UNIT/ML-% IJ SOLN
900.0000 [IU]/h | INTRAMUSCULAR | Status: DC
  Administered 2017-10-22: 900 [IU]/h via INTRAVENOUS
  Filled 2017-10-22: qty 250

## 2017-10-22 MED ORDER — MIDAZOLAM HCL 2 MG/2ML IJ SOLN
INTRAMUSCULAR | Status: AC | PRN
  Administered 2017-10-22 (×2): 1 mg via INTRAVENOUS

## 2017-10-22 MED ORDER — BUPIVACAINE HCL (PF) 0.5 % IJ SOLN
INTRAMUSCULAR | Status: AC | PRN
  Administered 2017-10-22: 30 mL

## 2017-10-22 MED ORDER — CEFAZOLIN SODIUM-DEXTROSE 2-4 GM/100ML-% IV SOLN
2.0000 g | Freq: Once | INTRAVENOUS | Status: AC
  Administered 2017-10-22: 2 g via INTRAVENOUS

## 2017-10-22 NOTE — Progress Notes (Signed)
PROGRESS NOTE  Heather Benson:[id number] DOB: 29-Jul-1931 DOA: 10/15/2017 PCP: [name], MD  Brief Narrative: 84 year old woman admitted for severe back pain in the setting of recent shingles, with known history of vertebral fractures.  Also treated for acute diverticulitis.  Further imaging confirmed L1 compression fracture, case was discussed with Dr. Rolena Infante of orthopedics who requested consultation with interventional radiology for kyphoplasty.  Kyphoplasty was delayed because of Eliquis.  Assessment/Plan Acute L1 compression fracture with pain --Kyphoplasty 6/12.  Continue pain control.  Plan for skilled nursing facility 6/13.  Recent shingles --Continue Neurontin for pain control.  Acute diverticulitis --Resolved.  Treated with antibiotics.  AKI on chronic kidney disease stage IV --Appears to be at baseline.  Atrial fibrillation, continue apixaban, amiodarone.  Multiple bilateral exophytic lesions of both kidneys. Majority of these lesions are cystic. Some increase in size. These are incompletely evaluated without IV contrast. Given the interval growth, MRI of the abdomen without and with contrast is recommended.   Likely skilled nursing facility tomorrow.  Resume apixaban tomorrow.  DVT prophylaxis: heparin Code Status: DNR Family Communication: none Disposition Plan: SNF    Heather Hodgkins, MD  [facility] Direct contact: [phone] --Via amion app OR  --[email or web address]; password [email or web address]  7PM-7AM contact night coverage as above 10/22/2017, 8:21 PM  LOS: 7 days   Consultants:  Orthopedics  IR  Procedures:  Kyphoplasty lumbar spine  Antimicrobials:    Interval history/Subjective: Feels better today.  Objective: Vitals:  Vitals:   10/22/17 1813 10/22/17 1848  BP: (!) 154/71 135/70  Pulse: 72 71  Resp: 14 16  Temp: 97.7 F (36.5 C) (!) 97.4 F (36.3 C)  SpO2: (!) 87% 94%    Exam:  Constitutional:  . Appears calm and  comfortable Respiratory:  . CTA bilaterally, no w/r/r.  . Respiratory effort normal.  Cardiovascular:  . RRR, no m/r/g . No LE extremity edema   Psychiatric:  . Mental status o Mood, affect appropriate   Moves bilateral lower extremities to command.   I have personally reviewed the following:   Labs:  BUN 29, creatinine 2.02.  Appears to be at baseline.  CBC unremarkable.  Imaging studies:  MRI: L1 burst fracture.  CT renal study 6/5 showed acute diverticulitis.  Acute/subacute vertebral gravity fractures at L1 and L4.  Scheduled Meds: . acetaminophen  1,000 mg Oral Q6H  . amiodarone  200 mg Oral Daily  . atorvastatin  10 mg Oral Daily  . bupivacaine      . bupivacaine      . calcitonin (salmon)  1 spray Alternating Nares Daily  . fentaNYL      . furosemide  20 mg Oral Daily  . gabapentin  100 mg Oral QHS  . gelatin adsorbable      . latanoprost  1 drop Both Eyes QHS  . lidocaine  1 patch Transdermal Q24H  . magnesium hydroxide  30 mL Oral Once  . midazolam      . pantoprazole  40 mg Oral Q0600  . polyethylene glycol  17 g Oral Daily  . predniSONE  10 mg Oral Q breakfast  . tobramycin       Continuous Infusions: . sodium chloride 50 mL/hr at 10/15/17 1657  . ceFAZolin    . heparin      Principal Problem:   Closed compression fracture of body of L1 vertebra (HCC) Active Problems:   PAF (paroxysmal atrial fibrillation) (HCC)   CKD (chronic kidney disease), stage IV ([place])  History of herpes zoster   LOS: 7 days

## 2017-10-22 NOTE — Progress Notes (Signed)
PT Cancellation Note  Patient Details Name: ANNSLEY AKKERMAN MRN: [id number] DOB: 05/25/1931   Cancelled Treatment:    Reason Eval/Treat Not Completed: Patient at procedure or test/unavailable  Patient currently undergoing kyphoplasty. Will follow up as schedule allows.    Lanney Gins, PT, DPT 10/22/17 3:22 PM

## 2017-10-22 NOTE — Care Management Note (Signed)
Case Management Note  Patient Details  Name: MYLEAH CAVENDISH MRN: [id number] Date of Birth: 09/14/1931  Subjective/Objective:  Lumbar pain             SERINA NICHTER (Spouse) Lismary Kiehn [facility])    [phone]     PCP: [name]  Action/Plan: Kyphoplasty today.Marland KitchenMarland KitchenMarland KitchenTransition to SNF when medically stable. CSM managing disposition to facility. NCM will continue to monitor for d/c needs.   Expected Discharge Date:   6/13//2019           Expected Discharge Plan:  SNF  In-House Referral:  Clinical Social Work  Discharge planning Services  CM Consult  Post Acute Care Choice:    Choice offered to:     DME Arranged:   N/A DME Agency:   N/A  HH Arranged:    N/A HH Agency:    N/A Status of Service:  Completed, signed off  If discussed at [place] of Stay Meetings, dates discussed:    Additional Comments:  Sharin Mons, RN 10/22/2017, 9:57 AM

## 2017-10-22 NOTE — Discharge Instructions (Signed)
Moderate Conscious Sedation, Adult, Care After  These instructions provide you with information about caring for yourself after your procedure. Your health care provider may also give you more specific instructions. Your treatment has been planned according to current medical practices, but problems sometimes occur. Call your health care provider if you have any problems or questions after your procedure.  What can I expect after the procedure?  After your procedure, it is common:   To feel sleepy for several hours.   To feel clumsy and have poor balance for several hours.   To have poor judgment for several hours.   To vomit if you eat too soon.    Follow these instructions at home:  For at least 24 hours after the procedure:     Do not:  ? Participate in activities where you could fall or become injured.  ? Drive.  ? Use heavy machinery.  ? Drink alcohol.  ? Take sleeping pills or medicines that cause drowsiness.  ? Make important decisions or sign legal documents.  ? Take care of children on your own.   Rest.  Eating and drinking   Follow the diet recommended by your health care provider.   If you vomit:  ? Drink water, juice, or soup when you can drink without vomiting.  ? Make sure you have little or no nausea before eating solid foods.  General instructions   Have a responsible adult stay with you until you are awake and alert.   Take over-the-counter and prescription medicines only as told by your health care provider.   If you smoke, do not smoke without supervision.   Keep all follow-up visits as told by your health care provider. This is important.  Contact a health care provider if:   You keep feeling nauseous or you keep vomiting.   You feel light-headed.   You develop a rash.   You have a fever.  Get help right away if:   You have trouble breathing.  This information is not intended to replace advice given to you by your health care provider. Make sure you discuss any questions you have  with your health care provider.  Document Released: 02/17/2013 Document Revised: 10/02/2015 Document Reviewed: 08/19/2015  Elsevier Interactive Patient Education  2018 [employer]vier Inc[employer]

## 2017-10-22 NOTE — Procedures (Signed)
S/P L1 and L4 VP 

## 2017-10-22 NOTE — Clinical Social Work Note (Signed)
Clinical Social Work Assessment  Patient Details  Name: Heather Benson MRN: [id number] Date of Birth: 1932-02-28  Date of referral:  10/22/17               Reason for consult:  Facility Placement                Permission sought to share information with:  Facility Sport and exercise psychologist, Family Supports Permission granted to share information::  Yes, Verbal Permission Granted  Name::     [name]  Agency::  SNFs  Relationship::  Spouse  Contact Information:  [phone] A  Housing/Transportation Living arrangements for the past 2 months:  [place] of Information:  Patient, Spouse Patient Interpreter Needed:  None Criminal Activity/Legal Involvement Pertinent to Current Situation/Hospitalization:  No - Comment as needed Significant Relationships:  Spouse, Adult Children Lives with:  Spouse Do you feel safe going back to the place where you live?  No Need for family participation in patient care:  No (Coment)  Care giving concerns:  CSW received consult for possible SNF placement at time of discharge. CSW spoke with patient and spouse at bedside regarding PT recommendation of SNF placement at time of discharge. Patient reported that she doesn't love the idea of going to SNF but knows she needs it. Patient expressed understanding of PT recommendation and is agreeable to SNF placement at time of discharge. CSW to continue to follow and assist with discharge planning needs.   Social Worker assessment / plan:  CSW spoke with patient concerning possibility of rehab at [facility] before returning home.  Employment status:  Retired Forensic scientist:  Medicare PT Recommendations:  [place] / Referral to community resources:  [place]  Patient/Family's Response to care:  Patient recognizes need for rehab before returning home and is agreeable to a SNF in [place]. Patient reported preference for [facility] since she  has been there before.  Patient/Family's Understanding of and Emotional Response to Diagnosis, Current Treatment, and Prognosis:  Patient/family is realistic regarding therapy needs and expressed being hopeful for SNF placement. Patient expressed understanding of CSW role and discharge process as well as medical condition. No questions/concerns about plan or treatment. She did express frustration at having her procedure delayed again.    Emotional Assessment Appearance:  Appears stated age Attitude/Demeanor/Rapport:  Other(Irritated that procedure was delayed) Affect (typically observed):  Accepting, Appropriate Orientation:  Oriented to Self, Oriented to Place, Oriented to  Time, Oriented to Situation Alcohol / Substance use:  Not Applicable Psych involvement (Current and /or in the community):  No (Comment)  Discharge Needs  Concerns to be addressed:  Care Coordination Readmission within the last 30 days:  No Current discharge risk:  None Barriers to Discharge:  Continued Medical Work up   [employer], [place] 10/22/2017, 10:59 AM

## 2017-10-22 NOTE — Progress Notes (Signed)
OT Cancellation Note  Patient Details Name: Heather Benson MRN: [id number] DOB: 1931/09/24   Cancelled Treatment:    Reason Eval/Treat Not Completed: Patient at procedure or test/ unavailable; transport arriving to take pt for procedure at start of OT tx session. Will follow up as schedule allows.   Lou Cal, OT Pager [phone] 10/22/2017   Raymondo Band 10/22/2017, 2:18 PM

## 2017-10-22 NOTE — Progress Notes (Signed)
ANTICOAGULATION CONSULT NOTE - Follow Up Consult  Pharmacy Consult for Heparin (while Eliquis on hold) Indication: atrial fibrillation  Allergies  Allergen Reactions  . Augmentin [Amoxicillin-Pot Clavulanate] Diarrhea and Nausea Only  . Boniva [Ibandronic Acid] Diarrhea  . Codeine Other (See Comments)    "Spaced out feeling" and Lightheadedness  . Hydromorphone Nausea And Vomiting  . Latex Rash    "Burns me"  . Lisinopril Cough  . Adhesive [Tape] Itching and Rash    Patient Measurements: Height: 5\' 6"  (167.6 cm) Weight: 177 lb 7.5 oz (80.5 kg) IBW/kg (Calculated) : 59.3 Heparin Dosing Weight: 75 kg  Vital Signs: Temp: 97.4 F (36.3 C) (06/12 1848) Temp Source: Oral (06/12 1848) BP: 135/70 (06/12 1848) Pulse Rate: 71 (06/12 1848)  Labs: Recent Labs    10/20/17 0246 10/21/17 0610 10/21/17 1016 10/22/17 0525  HGB 16.0* 16.3*  --  16.5*  HCT 51.7* 52.8*  --  53.3*  PLT 187 193  --  198  APTT 71*  --   --   --   LABPROT  --   --  13.5  --   INR  --   --  1.04  --   HEPARINUNFRC 1.04*  --   --   --   CREATININE 1.80*  --   --  2.02*    Estimated Creatinine Clearance: 22.6 mL/min (A) (by C-G formula based on SCr of 2.02 mg/dL (H)).   Medications:  Scheduled:  . acetaminophen  1,000 mg Oral Q6H  . amiodarone  200 mg Oral Daily  . atorvastatin  10 mg Oral Daily  . bupivacaine      . bupivacaine      . calcitonin (salmon)  1 spray Alternating Nares Daily  . fentaNYL      . furosemide  20 mg Oral Daily  . gabapentin  100 mg Oral QHS  . gelatin adsorbable      . latanoprost  1 drop Both Eyes QHS  . lidocaine  1 patch Transdermal Q24H  . magnesium hydroxide  30 mL Oral Once  . midazolam      . pantoprazole  40 mg Oral Q0600  . polyethylene glycol  17 g Oral Daily  . predniSONE  10 mg Oral Q breakfast  . tobramycin       Infusions:  . sodium chloride 50 mL/hr at 10/15/17 1657  . sodium chloride 50 mL/hr at 10/22/17 1742  . ceFAZolin      Assessment: 84  yo F on Eliquis PTA for hx afib.  Pt was transitioned to heparin pending plans for kyphoplasty . This was orginallly sccheduled for 6/11 then postponed until today 6/12.  Heparin drip was stopped on 6/10 at 16:00.  Now s/p lumbar kyphoplasty/vertebroplasty, ended at 15:15  I called Dr. Bronson Curb- he said okay to resume IV heparin 6 hour post the procedure as per IR antitcoagulation  Guidelines for standard risk proceured.  - Monitoring anticoagulation using aPTTs because anti-Xa levels are influenced by recent Eliquis dosing.  Previously aPTT was therapeutic on 900 units/hr   Goal of Therapy:  Heparin level 0.3-0.7 units/ml aPTT 66-102 seconds Monitor platelets by anticoagulation protocol: Yes   Plan:  Restart IV heparin tonight at 21:30 at 900 units/hr F/u aPTT/heparin level in AM ~ 8hr after restart, then  Daily aPTT/heparin level.  Nicole Cella, RPh Clinical Pharmacist After 4pm, please call Main [facility] [phone]) for assistance. 10/22/2017 7:14 PM

## 2017-10-23 ENCOUNTER — Encounter (HOSPITAL_COMMUNITY): Payer: Self-pay | Admitting: Interventional Radiology

## 2017-10-23 DIAGNOSIS — I480 Paroxysmal atrial fibrillation: Secondary | ICD-10-CM | POA: Diagnosis not present

## 2017-10-23 DIAGNOSIS — Z9889 Other specified postprocedural states: Secondary | ICD-10-CM | POA: Diagnosis not present

## 2017-10-23 DIAGNOSIS — K5792 Diverticulitis of intestine, part unspecified, without perforation or abscess without bleeding: Secondary | ICD-10-CM | POA: Diagnosis not present

## 2017-10-23 DIAGNOSIS — Z7401 Bed confinement status: Secondary | ICD-10-CM | POA: Diagnosis not present

## 2017-10-23 DIAGNOSIS — M79604 Pain in right leg: Secondary | ICD-10-CM | POA: Diagnosis not present

## 2017-10-23 DIAGNOSIS — I10 Essential (primary) hypertension: Secondary | ICD-10-CM | POA: Diagnosis not present

## 2017-10-23 DIAGNOSIS — N179 Acute kidney failure, unspecified: Secondary | ICD-10-CM | POA: Diagnosis not present

## 2017-10-23 DIAGNOSIS — M810 Age-related osteoporosis without current pathological fracture: Secondary | ICD-10-CM | POA: Diagnosis not present

## 2017-10-23 DIAGNOSIS — S32010D Wedge compression fracture of first lumbar vertebra, subsequent encounter for fracture with routine healing: Secondary | ICD-10-CM | POA: Diagnosis not present

## 2017-10-23 DIAGNOSIS — R05 Cough: Secondary | ICD-10-CM | POA: Diagnosis not present

## 2017-10-23 DIAGNOSIS — R509 Fever, unspecified: Secondary | ICD-10-CM | POA: Diagnosis not present

## 2017-10-23 DIAGNOSIS — M545 Low back pain: Secondary | ICD-10-CM

## 2017-10-23 DIAGNOSIS — K5909 Other constipation: Secondary | ICD-10-CM | POA: Diagnosis not present

## 2017-10-23 DIAGNOSIS — K5733 Diverticulitis of large intestine without perforation or abscess with bleeding: Secondary | ICD-10-CM | POA: Diagnosis not present

## 2017-10-23 DIAGNOSIS — M6281 Muscle weakness (generalized): Secondary | ICD-10-CM | POA: Diagnosis not present

## 2017-10-23 DIAGNOSIS — R2681 Unsteadiness on feet: Secondary | ICD-10-CM | POA: Diagnosis not present

## 2017-10-23 DIAGNOSIS — M546 Pain in thoracic spine: Secondary | ICD-10-CM | POA: Diagnosis not present

## 2017-10-23 DIAGNOSIS — B029 Zoster without complications: Secondary | ICD-10-CM | POA: Diagnosis not present

## 2017-10-23 DIAGNOSIS — N184 Chronic kidney disease, stage 4 (severe): Secondary | ICD-10-CM | POA: Diagnosis not present

## 2017-10-23 DIAGNOSIS — M2550 Pain in unspecified joint: Secondary | ICD-10-CM | POA: Diagnosis not present

## 2017-10-23 DIAGNOSIS — S32010A Wedge compression fracture of first lumbar vertebra, initial encounter for closed fracture: Secondary | ICD-10-CM | POA: Diagnosis not present

## 2017-10-23 DIAGNOSIS — M5136 Other intervertebral disc degeneration, lumbar region: Secondary | ICD-10-CM | POA: Diagnosis not present

## 2017-10-23 DIAGNOSIS — R41841 Cognitive communication deficit: Secondary | ICD-10-CM | POA: Diagnosis not present

## 2017-10-23 DIAGNOSIS — R278 Other lack of coordination: Secondary | ICD-10-CM | POA: Diagnosis not present

## 2017-10-23 LAB — CBC
HCT: 51.8 % — ABNORMAL HIGH (ref 36.0–46.0)
Hemoglobin: 16.3 g/dL — ABNORMAL HIGH (ref 12.0–15.0)
MCH: 29.9 pg (ref 26.0–34.0)
MCHC: 31.5 g/dL (ref 30.0–36.0)
MCV: 94.9 fL (ref 78.0–100.0)
Platelets: 219 10*3/uL (ref 150–400)
RBC: 5.46 MIL/uL — ABNORMAL HIGH (ref 3.87–5.11)
RDW: 17 % — ABNORMAL HIGH (ref 11.5–15.5)
WBC: 11.1 10*3/uL — ABNORMAL HIGH (ref 4.0–10.5)

## 2017-10-23 LAB — HEPARIN LEVEL (UNFRACTIONATED): Heparin Unfractionated: 0.46 IU/mL (ref 0.30–0.70)

## 2017-10-23 LAB — APTT: APTT: 62 s — AB (ref 24–36)

## 2017-10-23 MED ORDER — OXYCODONE HCL 5 MG PO TABS: 5.0000 mg | ORAL_TABLET | Freq: Four times a day (QID) | ORAL | 0 refills | Status: DC | PRN

## 2017-10-23 MED ORDER — BISACODYL 10 MG RE SUPP: 10.0000 mg | Freq: Every day | RECTAL | Status: DC | PRN

## 2017-10-23 MED ORDER — BISACODYL 10 MG RE SUPP
10.0000 mg | Freq: Once | RECTAL | Status: AC
  Administered 2017-10-23: 10 mg via RECTAL
  Filled 2017-10-23: qty 1

## 2017-10-23 MED ORDER — SENNA 8.6 MG PO TABS: 1.0000 | ORAL_TABLET | Freq: Every day | ORAL | Status: DC

## 2017-10-23 MED ORDER — POLYETHYLENE GLYCOL 3350 17 G PO PACK: 17.0000 g | PACK | Freq: Two times a day (BID) | ORAL | Status: DC

## 2017-10-23 MED ORDER — APIXABAN 2.5 MG PO TABS: 2.5000 mg | ORAL_TABLET | Freq: Two times a day (BID) | ORAL | Status: DC

## 2017-10-23 MED ORDER — BISACODYL 10 MG RE SUPP: 10.0000 mg | Freq: Once | RECTAL | Status: DC

## 2017-10-23 MED ORDER — GABAPENTIN 100 MG PO CAPS: 100.0000 mg | ORAL_CAPSULE | Freq: Every day | ORAL | Status: DC

## 2017-10-23 NOTE — Progress Notes (Signed)
[facility] has a bed available for patient today.  Percell Locus Nichol Ator LCSW [phone]

## 2017-10-23 NOTE — Clinical Social Work Placement (Signed)
   CLINICAL SOCIAL WORK PLACEMENT  NOTE  Date:  10/23/2017  Patient Details  Name: Heather Benson MRN: [id number] Date of Birth: 1931/09/16  Clinical Social Work is seeking post-discharge placement for this patient at the [place] level of care (*CSW will initial, date and re-position this form in  chart as items are completed):  Yes   Patient/family provided with [place] Work Department's list of facilities offering this level of care within the geographic area requested by the patient (or if unable, by the patient's family).  Yes   Patient/family informed of their freedom to choose among providers that offer the needed level of care, that participate in Medicare, Medicaid or managed care program needed by the patient, have an available bed and are willing to accept the patient.  Yes   Patient/family informed of [facility]'s ownership interest in [facility] and [facility], as well as of the fact that they are under no obligation to receive care at these facilities.  PASRR submitted to EDS on       PASRR number received on       Existing PASRR number confirmed on 10/21/17     FL2 transmitted to all facilities in geographic area requested by pt/family on 10/21/17     FL2 transmitted to all facilities within larger geographic area on       Patient informed that his/her managed care company has contracts with or will negotiate with certain facilities, including the following:        Yes   Patient/family informed of bed offers received.  Patient chooses bed at [facility], [place]     Physician recommends and patient chooses bed at      Patient to be transferred to [facility] [place], [place] on 10/23/17.  Patient to be transferred to facility by [facility]     Patient family notified on 10/23/17 of transfer.  Name of family member notified:  Spouse     PHYSICIAN       Additional Comment:     _______________________________________________ Benard Halsted, [place] 10/23/2017, 11:44 AM

## 2017-10-23 NOTE — Plan of Care (Signed)
  Problem: Education: Goal: Knowledge of General Education information will improve Outcome: Progressing Note:  POC reviewed with pt.   

## 2017-10-23 NOTE — Progress Notes (Addendum)
OT Cancellation Note  Patient Details Name: FLOREE ZUNIGA MRN: [id number] DOB: [birth year]   Cancelled Treatment:    Reason Eval/Treat Not Completed: Pain limiting ability to participate;Fatigue/lethargy limiting ability to participate. Pt reports that her back in hurting after sitting up in recliner too long this morning after PT. Pt/husband request OT return later. Will attempt to check back later as able/time allows, possible d/c to SNF this afternoon  Britt Bottom 10/23/2017, 12:39 PM

## 2017-10-23 NOTE — Progress Notes (Signed)
Patient will DC to: [place] [facility] [facility] Anticipated DC date: 10/23/17 Family notified: Spouse Transport by: Corey Harold 4:30pm   Per MD patient ready for DC to [facility]. RN, patient, patient's family, and facility notified of DC. Discharge Summary sent to facility. RN given number for report [phone] Room 205). DC packet on chart (including DNR and signed pain script). Ambulance transport requested for patient.   CSW signing off.  Cedric Fishman, LCSW Clinical Social Worker [phone]

## 2017-10-23 NOTE — Progress Notes (Signed)
ANTICOAGULATION CONSULT NOTE - Follow Up Consult  Pharmacy Consult for Heparin (while Eliquis on hold) Indication: atrial fibrillation  Allergies  Allergen Reactions  . Augmentin [Amoxicillin-Pot Clavulanate] Diarrhea and Nausea Only  . Boniva [Ibandronic Acid] Diarrhea  . Codeine Other (See Comments)    "Spaced out feeling" and Lightheadedness  . Hydromorphone Nausea And Vomiting  . Latex Rash    "Burns me"  . Lisinopril Cough  . Adhesive [Tape] Itching and Rash    Patient Measurements: Height: 5\' 6"  (167.6 cm) Weight: 177 lb 7.5 oz (80.5 kg) IBW/kg (Calculated) : 59.3 Heparin Dosing Weight: 75 kg  Vital Signs: Temp: 97.9 F (36.6 C) (06/13 0523) Temp Source: Oral (06/13 0523) BP: 115/67 (06/13 0523) Pulse Rate: 73 (06/13 0523)  Labs: Recent Labs    10/21/17 0610 10/21/17 1016 10/22/17 0525 10/23/17 0439  HGB 16.3*  --  16.5* 16.3*  HCT 52.8*  --  53.3* 51.8*  PLT 193  --  198 219  APTT  --   --   --  62*  LABPROT  --  13.5  --   --   INR  --  1.04  --   --   HEPARINUNFRC  --   --   --  0.46  CREATININE  --   --  2.02*  --     Estimated Creatinine Clearance: 22.6 mL/min (A) (by C-G formula based on SCr of 2.02 mg/dL (H)).   Medications:  Scheduled:  . acetaminophen  1,000 mg Oral Q6H  . amiodarone  200 mg Oral Daily  . atorvastatin  10 mg Oral Daily  . calcitonin (salmon)  1 spray Alternating Nares Daily  . furosemide  20 mg Oral Daily  . gabapentin  100 mg Oral QHS  . latanoprost  1 drop Both Eyes QHS  . lidocaine  1 patch Transdermal Q24H  . magnesium hydroxide  30 mL Oral Once  . pantoprazole  40 mg Oral Q0600  . polyethylene glycol  17 g Oral Daily  . predniSONE  10 mg Oral Q breakfast   Infusions:  . sodium chloride 50 mL/hr at 10/15/17 1657  . heparin 900 Units/hr (10/22/17 2127)    Assessment: 84 yo F on Eliquis PTA for hx afib.  Pt was transitioned to heparin pending plans for kyphoplasty . This was orginallly sccheduled for 6/11 then  postponed until today 6/12.  Heparin drip resumed 6 hours after surgery.  Heparin level is therapeutic   Goal of Therapy:  Heparin level 0.3-0.7 units/ml aPTT 66-102 seconds Monitor platelets by anticoagulation protocol: Yes   Plan:  Continue heparin at 900 units/hr  Daily heparin level and CBC  Thanks for allowing pharmacy to be a part of this patient's care.  Excell Seltzer, PharmD Clinical Pharmacist

## 2017-10-23 NOTE — Discharge Summary (Signed)
Physician Discharge Summary  Heather Benson SLH:[id number] DOB: 03/04/1932 DOA: 10/15/2017  PCP: [name], MD  Admit date: 10/15/2017 Discharge date: 10/23/2017  Recommendations for Outpatient Follow-up:   Acute L1 compression fracture with pain  Multiple bilateral exophytic lesions of both kidneys. Majority of these lesions are cystic. Some increase in size. These are incompletely evaluated without IV contrast. Given the interval growth, MRI of the abdomen without and with contrast is recommended.   Contact information for follow-up providers    [name], MD. Schedule an appointment as soon as possible for a visit in 1 week(s).   Specialty:  Family Medicine Contact information: [address] [place] [address] [phone]        Jettie Booze, MD .   Specialties:  Cardiology, Radiology, Interventional Cardiology Contact information: [address] [place] [address] [phone]            Contact information for after-discharge care    Destination    [facility] .   Service:  Skilled Nursing Contact information: [place] [phone]                   Discharge Diagnoses:  1. Acute L1 compression fracture with pain 2. Recent shingles 3. Acute diverticulitis 4. AKI on chronic kidney disease stage IV 5. Atrial fibrillation 6. Multiple bilateral exophytic lesions of both kidneys  Discharge Condition: improved Disposition: SNF for rehab  Diet recommendation: Regular  Filed Weights   10/20/17 0500 10/21/17 0500 10/22/17 0500  Weight: 80.5 kg (177 lb 7.5 oz) 80.6 kg (177 lb 11.1 oz) 80.5 kg (177 lb 7.5 oz)    History of present illness:  84 year old woman admitted for severe back pain in the setting of recent shingles, with known history of vertebral fractures.  Hospital Course:  Also treated for acute diverticulitis.   Further imaging confirmed L1 compression fracture, case was discussed with Dr. Rolena Infante of orthopedics who requested consultation with interventional radiology for kyphoplasty.  Kyphoplasty was delayed because of Eliquis.  Kyphoplasty completed 1/69 without complication.  Acute L1 compression fracture with pain --Kyphoplasty 6/12.  Continue pain control.    Recent shingles --Continue Neurontin for pain control.  Acute diverticulitis --Resolved. Treated with antibiotics.  AKI on chronic kidney disease stage IV --At baseline  Atrial fibrillation, continue amiodarone.  Resume amiodarone.  Multiple bilateral exophytic lesions of both kidneys. Majority of these lesions are cystic. Some increase in size. These are incompletely evaluated without IV contrast. Given the interval growth, MRI of the abdomen without and with contrast is recommended.  Consultants:  Orthopedics  IR  Procedures:  Kyphoplasty lumbar spine  Today's assessment: S: Reports feeling poorly today.  Significant pain in back after sitting up for long time a chair.  Feels okay in bed at the morning.  No weakness of legs.  No numbness.  Reports no recent bowel movement. O: Vitals:  Vitals:   10/22/17 2139 10/23/17 0523  BP: (!) 147/68 115/67  Pulse: 73 73  Resp: 17 17  Temp: 98 F (36.7 C) 97.9 F (36.6 C)  SpO2: 92% 98%    Constitutional:  . Appears calm and comfortable Respiratory:  . CTA bilaterally, no w/r/r.  . Respiratory effort normal.  Cardiovascular:  . RRR, no m/r/g . No LE extremity edema   Musculoskeletal:  . RUE, LUE, RLE, LLE   o strength and tone normal, no atrophy, no abnormal movements  Psychiatric:  . judgement and insight appear normal . Mental status o Mood, affect appropriate  CBC unremarkable  Discharge Instructions   Allergies as of 10/23/2017      Reactions   Augmentin [amoxicillin-pot Clavulanate] Diarrhea, Nausea Only   Boniva [ibandronic Acid] Diarrhea    Codeine Other (See Comments)   "Spaced out feeling" and Lightheadedness   Hydromorphone Nausea And Vomiting   Latex Rash   "Burns me"   Lisinopril Cough   Adhesive [tape] Itching, Rash      Medication List    STOP taking these medications   HYDROcodone-acetaminophen 5-325 MG tablet Commonly known as:  NORCO/VICODIN     TAKE these medications   acetaminophen 500 MG tablet Commonly known as:  TYLENOL Take 1,000 mg by mouth every 8 (eight) hours as needed (for pain).   amiodarone 200 MG tablet Commonly known as:  PACERONE Take 1 tablet (200 mg total) by mouth daily.   atorvastatin 10 MG tablet Commonly known as:  LIPITOR Take 1 tablet (10 mg total) by mouth daily.   beclomethasone 80 MCG/ACT inhaler Commonly known as:  QVAR Inhale 2 puffs into the lungs 2 (two) times daily as needed (for flares).   bisacodyl 10 MG suppository Commonly known as:  DULCOLAX Place 1 suppository (10 mg total) rectally daily as needed for moderate constipation.   ELIQUIS 2.5 MG Tabs tablet Generic drug:  apixaban TAKE 1 TABLET BY MOUTH TWICE A DAY   fexofenadine 180 MG tablet Commonly known as:  ALLEGRA Take 180 mg by mouth daily as needed for allergies.   fluticasone 50 MCG/ACT nasal spray Commonly known as:  FLONASE Place 1 spray into both nostrils daily as needed for allergies.   FLUTTER Devi Use as directed.   furosemide 40 MG tablet Commonly known as:  LASIX Take 40 mg by mouth See admin instructions. Take 40 mg by mouth once a day and may take a second dose of 40 mg if needed for swelling   gabapentin 100 MG capsule Commonly known as:  NEURONTIN Take 1 capsule (100 mg total) by mouth at bedtime.   guaiFENesin 600 MG 12 hr tablet Commonly known as:  MUCINEX Take 600 mg by mouth 2 (two) times daily as needed for cough or to loosen phlegm.   latanoprost 0.005 % ophthalmic solution Commonly known as:  XALATAN Place 1 drop into both eyes at bedtime.   meclizine 25 MG  tablet Commonly known as:  ANTIVERT Take 12.5 mg by mouth 3 (three) times daily as needed for dizziness.   oxyCODONE 5 MG immediate release tablet Commonly known as:  Oxy IR/ROXICODONE Take 1 tablet (5 mg total) by mouth every 6 (six) hours as needed for severe pain.   polyethylene glycol packet Commonly known as:  MIRALAX / GLYCOLAX Take 17 g by mouth 2 (two) times daily.   potassium chloride 10 MEQ tablet Commonly known as:  K-DUR Take 10 mEq by mouth daily.   predniSONE 10 MG tablet Commonly known as:  DELTASONE Take 10 mg by mouth daily with breakfast.   senna 8.6 MG Tabs tablet Commonly known as:  SENOKOT Take 1 tablet (8.6 mg total) by mouth at bedtime.      Allergies  Allergen Reactions  . Augmentin [Amoxicillin-Pot Clavulanate] Diarrhea and Nausea Only  . Boniva [Ibandronic Acid] Diarrhea  . Codeine Other (See Comments)    "Spaced out feeling" and Lightheadedness  . Hydromorphone Nausea And Vomiting  . Latex Rash    "Burns me"  .  Lisinopril Cough  . Adhesive [Tape] Itching and Rash    The results of significant diagnostics from this hospitalization (including imaging, microbiology, ancillary and laboratory) are listed below for reference.    Significant Diagnostic Studies: Dg Chest 2 View  Result Date: 10/15/2017 CLINICAL DATA:  Hypoxia. EXAM: CHEST - 2 VIEW COMPARISON:  12/26/2015 FINDINGS: Cardiomediastinal silhouette is normal. Mediastinal contours appear intact. Calcific atherosclerotic disease and tortuosity of the aorta. There is no evidence of focal airspace consolidation, pleural effusion or pneumothorax. Osseous structures are without acute abnormality. Soft tissues are grossly normal. IMPRESSION: No active cardiopulmonary disease. Calcific atherosclerotic disease and tortuosity of the aorta. Electronically Signed   By: Fidela Salisbury M.D.   On: 10/15/2017 14:36   Ct Lumbar Spine Wo Contrast  Result Date: 10/16/2017 CLINICAL DATA:  Back pain  corresponding to shingles for 2 weeks. No injury. History of melanoma. EXAM: CT LUMBAR SPINE WITHOUT CONTRAST TECHNIQUE: Multidetector CT imaging of the lumbar spine was performed without intravenous contrast administration. Multiplanar CT image reconstructions were also generated. COMPARISON:  CT abdomen and pelvis October 15, 2017 FINDINGS: SEGMENTATION: For the purposes of this report the last well-formed intervertebral disc space is reported as L5-S1. ALIGNMENT: Maintained lumbar lordosis. No malalignment. VERTEBRAE: Acute L1 burst fracture, 12 mm in craniocaudad dimension, 15 mm in craniocaudad dimension yesterday. 4 mm L1 retropulsed bony fragments. Subacute appearing L4 compression fracture with approximately 50% height loss, stable from yesterday. Or mm L4 retropulsed bony fragments. Old moderate T12 compression fracture. Remaining lumbar vertebral bodies intact moderate-to-severe L5-S1 disc height loss with vacuum disc and endplate spurring compatible with degenerative disc. Remaining disc heights maintained. Osteopenia without destructive bony lesions. PARASPINAL AND OTHER SOFT TISSUES: Moderate paraspinal muscle atrophy. Severe calcific atherosclerosis aorta which measures up to 2 cm. Numerous complex renal cysts, incompletely evaluated with small nonobstructing left nephrolithiasis. DISC LEVELS: T11-12: Endplate spurring directed towards the right. No canal stenosis. Mild right neural foraminal narrowing. T12-L1: No disc bulge, canal stenosis nor neural foraminal narrowing. L1-2: Mild canal stenosis at L1 due to retropulsed bony fragments with narrowed lateral recesses which may affect the traversing L1 nurse. No neural foraminal narrowing. L2-3: No disc bulge, canal stenosis nor neural foraminal narrowing. L3-4: Retropulsed bony fragments, mild facet arthropathy. No canal stenosis or neural foraminal narrowing. L4-5: Small broad-based right subarticular disc osteophyte complex. Mild facet arthropathy. No  canal stenosis. Mild bilateral neural foraminal narrowing. L5-S1: Right extraforaminal small disc osteophyte complex. No canal stenosis. Mild right neural foraminal narrowing and encroachment upon the exiting right L5 nerve. IMPRESSION: 1. Acute L1 burst fracture with 20% further height loss from yesterday's CT with further retropulsion. Mild canal stenosis at L1. 2. Subacute appearing moderate L4 burst fracture with similar retropulsed bony fragments. Old moderate T12 compression fracture. 3. Mild neural foraminal narrowing T11-12, L4-5 and L5-S1. 4. These results will be called to the ordering clinician or representative by the Radiologist Assistant, and communication documented in the PACS or zVision Dashboard. Aortic Atherosclerosis (ICD10-I70.0). Electronically Signed   By: Elon Alas M.D.   On: 10/16/2017 14:05   Mr Lumbar Spine Wo Contrast  Result Date: 10/18/2017 CLINICAL DATA:  L1 fracture.  Evaluation for spinal augmentation. EXAM: MRI LUMBAR SPINE WITHOUT CONTRAST TECHNIQUE: Multiplanar, multisequence MR imaging of the lumbar spine was performed. No intravenous contrast was administered. COMPARISON:  Abdominopelvic CT 10/15/2017 and 12/24/2016. Lumbar spine CT 10/16/2017 FINDINGS: Despite efforts by the technologist and patient, mild motion artifact is present on today's exam and could not  be eliminated. This reduces exam sensitivity and specificity. Motion is greatest on the sagittal T2 weighted images. Segmentation: Conventional anatomy assumed, with the last open disc space designated L5-S1.This is concordant with previous imaging. Alignment:  Stable. Vertebrae: There is progressive loss of height at the acute L1 burst fracture now measuring 10 mm craniocaudal, previously 12 mm. There is approximately 8 mm of osseous retropulsion. There is marrow edema throughout the L1 vertebral body with probable linear hemorrhage in the horizontal plane. The posterior elements are intact. There are no  other acute fractures. There is a mild superior endplate compression fracture at L4 which is new from 2018, although largely healed with minimal subchondral edema. Chronic T12 fracture is healed without residual edema. The visualized sacroiliac joints appear unremarkable. Paraspinal and other soft tissues: No paraspinal hematoma demonstrated. There are bilateral renal cysts. Disc levels: T11-12: Disc bulging with endplate osteophytes asymmetric to the right. No cord deformity or foraminal compromise. T12-L1: No significant findings. L1-2: The retropulsed bone exerts some mass effect on the thecal sac, but no cord deformity. At the level of the disc, there is no spinal stenosis or nerve root encroachment. L2-3: Mild disc bulging. No spinal stenosis or nerve root encroachment. L3-4: Annular disc bulging, mild osseous retropulsion from the L4 fracture and facet/ligamentous hypertrophy contribute to mild spinal stenosis. The lateral recesses and foramina are patent. L4-5: Stable disc bulging, facet and ligamentous hypertrophy. No nerve root encroachment. L5-S1: Stable chronic degenerative disc disease with endplate osteophytes and mild facet hypertrophy. Mild inferior foraminal narrowing without definite nerve root encroachment. IMPRESSION: 1. L1 burst fracture demonstrates progressive loss of height and osseous retropulsion compared with CT 2 days ago. This fracture appears acute with marrow edema and probable hemorrhage. 2. Late subacute superior endplate compression fracture at L4 is largely healed with minimal residual marrow edema. This fracture has developed since 12/24/2016. 3. Chronic T12 compression fracture. 4. Multilevel spondylosis as described, without high-grade spinal stenosis or nerve root encroachment. Electronically Signed   By: Richardean Sale M.D.   On: 10/18/2017 17:15   Ir Vertebroplasty Lumbar Bx Inc Uni/bil Inc Inject/imaging  Result Date: 10/23/2017 INDICATION: Severe low back pain secondary  to compression fractures at L1 and L4. EXAM: VERTEBROPLASTY AT L1 AND L4 COMPARISON:  MRI of the lumbosacral spine of 10/18/2017. MEDICATIONS: As antibiotic prophylaxis, Ancef 2 g IV was ordered pre-procedure and administered intravenously within 1 hour of incision. ANESTHESIA/SEDATION: Moderate (conscious) sedation was employed during this procedure. A total of Versed 2 mg and Fentanyl 50 mcg was administered intravenously. Moderate Sedation Time: 47 minutes. The patient's level of consciousness and vital signs were monitored continuously by radiology nursing throughout the procedure under my direct supervision. Moderate (conscious) sedation was employed during this procedure. A total of Versed 2 mg and Fentanyl 50 mcg was administered intravenously. FLUOROSCOPY TIME:  Fluoroscopy Time: 22 minutes 42 seconds (3321 mGy). COMPLICATIONS: None immediate. TECHNIQUE: Informed written consent was obtained from the patient after a thorough discussion of the procedural risks, benefits and alternatives. All questions were addressed. Maximal Sterile Barrier Technique was utilized including caps, mask, sterile gowns, sterile gloves, sterile drape, hand hygiene and skin antiseptic. A timeout was performed prior to the initiation of the procedure. PROCEDURE: The patient was placed prone on the fluoroscopic table. Nasal oxygen was administered. Physiologic monitoring was performed throughout the duration of the procedure. The skin overlying the lumbar region was prepped and draped in the usual sterile fashion. The L1 vertebral body and the L4 vertebral  body were identified and the right pedicle at L1 and both pedicles at L4 were infiltrated with 0.25% Bupivacaine. This was then followed by the advancement of a 13-gauge Cook needle through the right pedicle at L1 and both pedicles at L4 into the anterior one-third at these 2 levels. A gentle contrast injection demonstrated a trabecular pattern of contrast with early opacification  of paraspinous veins. This necessitated the injection of Gel-Foam pledgets through the 13 gauge Cook spinal needles prior to the delivery of the methylmethacrylate mixture. At this time, methylmethacrylate mixture was reconstituted. Under biplane intermittent fluoroscopy, the methylmethacrylate was then injected into the L1 and L4 vertebral bodies with filling of the vertebral bodies at these 2 levels. At L4 there was opacification of the left paraspinous anterior vein which extended from L4 to the inferior aspect of L3. The methylmethacrylate mixture remained stable at this site throughout the procedure. The patient did not exhibit any hemodynamic or respiratory changes. No extravasation was noted into the disk spaces or posteriorly into the spinal canal. The needles were then removed. Hemostasis was achieved at the skin entry sites. There were no acute complications. Patient tolerated the procedure well. Patient was then transferred to the floor in a stable condition. IMPRESSION: 1. Status post vertebral body augmentation for painful compression fracture at L1 and L4 using vertebroplasty technique. Electronically Signed   By: Luanne Bras M.D.   On: 10/22/2017 16:35   Ir Vertebroplasty Ea Addl (t&ls) Bx Inc Uni/bil Inc Inject/imaging  Result Date: 10/23/2017 INDICATION: Severe low back pain secondary to compression fractures at L1 and L4. EXAM: VERTEBROPLASTY AT L1 AND L4 COMPARISON:  MRI of the lumbosacral spine of 10/18/2017. MEDICATIONS: As antibiotic prophylaxis, Ancef 2 g IV was ordered pre-procedure and administered intravenously within 1 hour of incision. ANESTHESIA/SEDATION: Moderate (conscious) sedation was employed during this procedure. A total of Versed 2 mg and Fentanyl 50 mcg was administered intravenously. Moderate Sedation Time: 47 minutes. The patient's level of consciousness and vital signs were monitored continuously by radiology nursing throughout the procedure under my direct  supervision. Moderate (conscious) sedation was employed during this procedure. A total of Versed 2 mg and Fentanyl 50 mcg was administered intravenously. FLUOROSCOPY TIME:  Fluoroscopy Time: 22 minutes 42 seconds (3321 mGy). COMPLICATIONS: None immediate. TECHNIQUE: Informed written consent was obtained from the patient after a thorough discussion of the procedural risks, benefits and alternatives. All questions were addressed. Maximal Sterile Barrier Technique was utilized including caps, mask, sterile gowns, sterile gloves, sterile drape, hand hygiene and skin antiseptic. A timeout was performed prior to the initiation of the procedure. PROCEDURE: The patient was placed prone on the fluoroscopic table. Nasal oxygen was administered. Physiologic monitoring was performed throughout the duration of the procedure. The skin overlying the lumbar region was prepped and draped in the usual sterile fashion. The L1 vertebral body and the L4 vertebral body were identified and the right pedicle at L1 and both pedicles at L4 were infiltrated with 0.25% Bupivacaine. This was then followed by the advancement of a 13-gauge Cook needle through the right pedicle at L1 and both pedicles at L4 into the anterior one-third at these 2 levels. A gentle contrast injection demonstrated a trabecular pattern of contrast with early opacification of paraspinous veins. This necessitated the injection of Gel-Foam pledgets through the 13 gauge Cook spinal needles prior to the delivery of the methylmethacrylate mixture. At this time, methylmethacrylate mixture was reconstituted. Under biplane intermittent fluoroscopy, the methylmethacrylate was then injected into the L1  and L4 vertebral bodies with filling of the vertebral bodies at these 2 levels. At L4 there was opacification of the left paraspinous anterior vein which extended from L4 to the inferior aspect of L3. The methylmethacrylate mixture remained stable at this site throughout the  procedure. The patient did not exhibit any hemodynamic or respiratory changes. No extravasation was noted into the disk spaces or posteriorly into the spinal canal. The needles were then removed. Hemostasis was achieved at the skin entry sites. There were no acute complications. Patient tolerated the procedure well. Patient was then transferred to the floor in a stable condition. IMPRESSION: 1. Status post vertebral body augmentation for painful compression fracture at L1 and L4 using vertebroplasty technique. Electronically Signed   By: Luanne Bras M.D.   On: 10/22/2017 16:35   Ct Renal Stone Study  Result Date: 10/15/2017 CLINICAL DATA:  Flank pain, stone disease suspected. Shingles. Symptoms for 1 week. Symptoms are worse today. EXAM: CT ABDOMEN AND PELVIS WITHOUT CONTRAST TECHNIQUE: Multidetector CT imaging of the abdomen and pelvis was performed following the standard protocol without IV contrast. COMPARISON:  CT of the abdomen and pelvis 12/24/2016. FINDINGS: Lower chest: Mild dependent atelectasis is present at both lung bases, right greater than left. No significant consolidation is present. There is no nodule or mass lesion. Coronary artery calcifications are present. Extensive mitral annular calcifications are present. No significant pleural or pericardial effusion is present. Hepatobiliary: No focal liver abnormality is seen. No gallstones, gallbladder wall thickening, or biliary dilatation. Pancreas: Unremarkable. No pancreatic ductal dilatation or surrounding inflammatory changes. Spleen: The spleen is within normal limits. Adrenals/Urinary Tract: Adrenals are within normal limits. Multiple exophytic lesions are again seen bilaterally. Right-sided lesions measure water density. 2 upper pole lesions posteriorly on the left measure slightly higher density. The larger measures 2.1 cm, decreased since the prior exam. Ureters are within normal limits. The urinary bladder is unremarkable. No stones  are present. Stomach/Bowel: The stomach and duodenum are within normal limits for the small bowel is unremarkable. Terminal ileum is within normal limits. The appendix is visualized and normal. Ascending colon is normal transverse colon and descending colon are within normal limits. Diverticular changes are present within the sigmoid colon. There is wall thickening with minimal stranding. No fluid collection or free air is present. The rectum is within normal limits. Vascular/Lymphatic: Atherosclerotic calcifications are present in the aorta and branch vessels without aneurysm. No significant adenopathy is present. Reproductive: Status post hysterectomy. No adnexal masses. Other: No abdominal wall hernia or abnormality. No abdominopelvic ascites. Musculoskeletal: L1 comminuted vertebral body fracture is present. There is sparing of the posterior elements. Fracture involves predominantly the inferior endplate. A remote superior endplate fracture at N23 is stable. Acute/subacute superior endplate fractures present at L4. Bone fragments are retropulsed 5 mm into the canal. Vertebral body heights at L2, L3, and L5 are preserved. IMPRESSION: 1. Diverticular disease with wall thickening and slight stranding in the sigmoid colon discerning for developing acute diverticulitis. 2. Acute/subacute vertebral body fractures at L1 and L4. Retropulsed bone at L4 extends 5 mm into the canal. 3.  Aortic Atherosclerosis (ICD10-I70.0).  No aneurysm is present. 4. Multiple bilateral exophytic lesions of both kidneys. Majority of these lesions are cystic. Some increase in size. These are incompletely evaluated without IV contrast. Given the interval growth, MRI of the abdomen without and with contrast is recommended. Electronically Signed   By: San Morelle M.D.   On: 10/15/2017 15:01    Microbiology: Recent Results (  from the past 240 hour(s))  Surgical PCR screen     Status: None   Collection Time: 10/21/17 12:02 PM    Result Value Ref Range Status   MRSA, PCR NEGATIVE NEGATIVE Final   Staphylococcus aureus NEGATIVE NEGATIVE Final    Comment: (NOTE) The Xpert SA Assay (FDA approved for NASAL specimens in patients 24 years of age and older), is one component of a comprehensive surveillance program. It is not intended to diagnose infection nor to guide or monitor treatment. Performed at [facility], [address] [place], [place] [address]      Labs: Basic Metabolic Panel: Recent Labs  Lab 10/17/17 0602 10/20/17 0246 10/22/17 0525  NA 140 143 143  K 3.7 4.5 3.9  CL 104 103 103  CO2 28 28 29   GLUCOSE 106* 98 90  BUN 31* 22* 29*  CREATININE 2.22* 1.80* 2.02*  CALCIUM 8.9 9.6 9.7   CBC: Recent Labs  Lab 10/19/17 0522 10/20/17 0246 10/21/17 0610 10/22/17 0525 10/23/17 0439  WBC 10.3 11.7* 10.5 10.0 11.1*  HGB 15.2* 16.0* 16.3* 16.5* 16.3*  HCT 48.9* 51.7* 52.8* 53.3* 51.8*  MCV 96.6 97.2 95.7 96.4 94.9  PLT 176 187 193 198 219    Recent Labs    10/15/17 1055  BNP 141.8*    Principal Problem:   Closed compression fracture of body of L1 vertebra (HCC) Active Problems:   PAF (paroxysmal atrial fibrillation) (HCC)   CKD (chronic kidney disease), stage IV (HCC)   History of herpes zoster   Time coordinating discharge: 35 minutes  Signed:  [name], MD [facility] 10/23/2017, 2:25 PM

## 2017-10-23 NOTE — Progress Notes (Addendum)
Pt had moderate bowel movement post suppository.  Report called to [facility], Estill Bamberg.

## 2017-10-23 NOTE — Progress Notes (Signed)
Physical Therapy Treatment Patient Details Name: Heather Benson MRN: [id number] DOB: 1932/03/07 Today's Date: 10/23/2017    History of Present Illness Heather Benson is a 84 y.o. female with medical history significant for DJD, hypertension, GERD, CKD stage III, recent shingles, brought to the emergency department with worsening lower back pain and left-sided abdominal pain.  She was seen by her orthopedic surgeon for the back pain 4 weeks ago and received a steroid injection. Her pain is complicated by development of shingles and acute diverticulitis seen on renal CT.     PT Comments    Patient s/p L1 and L4 vertebroplasty on 10/22/17. Up until today, mobility very limited due to pain. Today patient able to transfer to EOB and progress to short distance ambulation within room with RW. Does require Min A support to power up into standing and for stability with gait. Patient stating she was "nervous" throughout session, but very motivated to be OOB. Discharge recommendations still appropriate.    Follow Up Recommendations  SNF;Supervision/Assistance - 24 hour     Equipment Recommendations  Other (comment)(will determine at next venue of care)    Recommendations for Other Services       Precautions / Restrictions Precautions Precautions: Fall Restrictions Weight Bearing Restrictions: No    Mobility  Bed Mobility Overal bed mobility: Needs Assistance Bed Mobility: Supine to Sit     Supine to sit: Min assist;Min guard     General bed mobility comments: able to come to EOB with Min A - pain increase with activity, but resolves to baseline fo 4/10 with rest; sitting EOB x 4 min with Min guard for safety  Transfers Overall transfer level: Needs assistance Equipment used: Rolling walker (2 wheeled) Transfers: Sit to/from [facility] Sit to Stand: Min assist Stand pivot transfers: Min guard       General transfer comment: bed elevated for greater ease of  transfer  Ambulation/Gait Ambulation/Gait assistance: Min assist;Min guard Ambulation Distance (Feet): 25 Feet Assistive device: Rolling walker (2 wheeled) Gait Pattern/deviations: Step-to pattern;Decreased stride length;Narrow base of support;Trunk flexed Gait velocity: decreased   General Gait Details: very slow pace, seemingly hesitant to take steps forward; very limited distance within room due to patient anxiety and pain   Stairs             Wheelchair Mobility    Modified Rankin (Stroke Patients Only)       Balance Overall balance assessment: Needs assistance Sitting-balance support: Bilateral upper extremity supported;Feet supported Sitting balance-Leahy Scale: Fair     Standing balance support: Bilateral upper extremity supported;During functional activity Standing balance-Leahy Scale: Poor                              Cognition Arousal/Alertness: Awake/alert Behavior During Therapy: Anxious("I'm nervous") Overall Cognitive Status: Within Functional Limits for tasks assessed                                        Exercises      General Comments        Pertinent Vitals/Pain Pain Assessment: 0-10 Pain Score: 6  Pain Location: low back with sitting and ambulation Pain Descriptors / Indicators: Discomfort;Grimacing;Guarding;Moaning Pain Intervention(s): Limited activity within patient's tolerance;Monitored during session;Repositioned    Home Living  Prior Function            PT Goals (current goals can now be found in the care plan section) Acute Rehab PT Goals Patient Stated Goal: to get better PT Goal Formulation: With patient Time For Goal Achievement: 10/31/17 Potential to Achieve Goals: Good Progress towards PT goals: Progressing toward goals    Frequency    Min 3X/week      PT Plan Current plan remains appropriate    Co-evaluation              AM-PAC PT "6  Clicks" Daily Activity  Outcome Measure  Difficulty turning over in bed (including adjusting bedclothes, sheets and blankets)?: A Lot Difficulty moving from lying on back to sitting on the side of the bed? : Unable Difficulty sitting down on and standing up from a chair with arms (e.g., wheelchair, bedside commode, etc,.)?: Unable Help needed moving to and from a bed to chair (including a wheelchair)?: A Little Help needed walking in hospital room?: A Little Help needed climbing 3-5 steps with a railing? : A Lot 6 Click Score: 12    End of Session Equipment Utilized During Treatment: Gait belt Activity Tolerance: Patient tolerated treatment well;Patient limited by pain Patient left: in chair;with call bell/phone within reach Nurse Communication: Mobility status PT Visit Diagnosis: Unsteadiness on feet (R26.81);Pain;Muscle weakness (generalized) (M62.81);Difficulty in walking, not elsewhere classified (R26.2) Pain - part of body: (low back)     Time: 7897-8478 PT Time Calculation (min) (ACUTE ONLY): 26 min  Charges:  $Gait Training: 8-22 mins $Therapeutic Activity: 8-22 mins                    G Codes:        Lanney Gins, PT, DPT 10/23/17 9:40 AM

## 2017-10-23 NOTE — Progress Notes (Signed)
OT Cancellation Note  Patient Details Name: Heather Benson MRN: [id number] DOB: [birth year]   Cancelled Treatment:    Reason Eval/Treat Not Completed: Fatigue/lethargy limiting ability to participate. Have attempted x 2 to see pt today Britt Bottom 10/23/2017, 2:08 PM

## 2017-10-25 DIAGNOSIS — I480 Paroxysmal atrial fibrillation: Secondary | ICD-10-CM | POA: Diagnosis not present

## 2017-10-25 DIAGNOSIS — B029 Zoster without complications: Secondary | ICD-10-CM | POA: Diagnosis not present

## 2017-10-25 DIAGNOSIS — K5909 Other constipation: Secondary | ICD-10-CM | POA: Diagnosis not present

## 2017-10-25 DIAGNOSIS — M545 Low back pain: Secondary | ICD-10-CM | POA: Diagnosis not present

## 2017-10-25 DIAGNOSIS — N184 Chronic kidney disease, stage 4 (severe): Secondary | ICD-10-CM | POA: Diagnosis not present

## 2017-10-25 DIAGNOSIS — I10 Essential (primary) hypertension: Secondary | ICD-10-CM | POA: Diagnosis not present

## 2017-10-25 DIAGNOSIS — M810 Age-related osteoporosis without current pathological fracture: Secondary | ICD-10-CM | POA: Diagnosis not present

## 2017-10-31 DIAGNOSIS — Z9889 Other specified postprocedural states: Secondary | ICD-10-CM | POA: Diagnosis not present

## 2017-10-31 DIAGNOSIS — M5136 Other intervertebral disc degeneration, lumbar region: Secondary | ICD-10-CM | POA: Diagnosis not present

## 2017-11-16 DIAGNOSIS — N281 Cyst of kidney, acquired: Secondary | ICD-10-CM | POA: Diagnosis not present

## 2017-11-16 DIAGNOSIS — I129 Hypertensive chronic kidney disease with stage 1 through stage 4 chronic kidney disease, or unspecified chronic kidney disease: Secondary | ICD-10-CM | POA: Diagnosis not present

## 2017-11-16 DIAGNOSIS — I872 Venous insufficiency (chronic) (peripheral): Secondary | ICD-10-CM | POA: Diagnosis not present

## 2017-11-16 DIAGNOSIS — E785 Hyperlipidemia, unspecified: Secondary | ICD-10-CM | POA: Diagnosis not present

## 2017-11-16 DIAGNOSIS — M810 Age-related osteoporosis without current pathological fracture: Secondary | ICD-10-CM | POA: Diagnosis not present

## 2017-11-16 DIAGNOSIS — I4891 Unspecified atrial fibrillation: Secondary | ICD-10-CM | POA: Diagnosis not present

## 2017-11-16 DIAGNOSIS — H409 Unspecified glaucoma: Secondary | ICD-10-CM | POA: Diagnosis not present

## 2017-11-16 DIAGNOSIS — H8110 Benign paroxysmal vertigo, unspecified ear: Secondary | ICD-10-CM | POA: Diagnosis not present

## 2017-11-16 DIAGNOSIS — N184 Chronic kidney disease, stage 4 (severe): Secondary | ICD-10-CM | POA: Diagnosis not present

## 2017-11-16 DIAGNOSIS — J45909 Unspecified asthma, uncomplicated: Secondary | ICD-10-CM | POA: Diagnosis not present

## 2017-11-16 DIAGNOSIS — B029 Zoster without complications: Secondary | ICD-10-CM | POA: Diagnosis not present

## 2017-11-16 DIAGNOSIS — S32040D Wedge compression fracture of fourth lumbar vertebra, subsequent encounter for fracture with routine healing: Secondary | ICD-10-CM | POA: Diagnosis not present

## 2017-11-16 DIAGNOSIS — S32010D Wedge compression fracture of first lumbar vertebra, subsequent encounter for fracture with routine healing: Secondary | ICD-10-CM | POA: Diagnosis not present

## 2017-11-19 DIAGNOSIS — I129 Hypertensive chronic kidney disease with stage 1 through stage 4 chronic kidney disease, or unspecified chronic kidney disease: Secondary | ICD-10-CM | POA: Diagnosis not present

## 2017-11-19 DIAGNOSIS — J45909 Unspecified asthma, uncomplicated: Secondary | ICD-10-CM | POA: Diagnosis not present

## 2017-11-19 DIAGNOSIS — S32040D Wedge compression fracture of fourth lumbar vertebra, subsequent encounter for fracture with routine healing: Secondary | ICD-10-CM | POA: Diagnosis not present

## 2017-11-19 DIAGNOSIS — N184 Chronic kidney disease, stage 4 (severe): Secondary | ICD-10-CM | POA: Diagnosis not present

## 2017-11-19 DIAGNOSIS — I4891 Unspecified atrial fibrillation: Secondary | ICD-10-CM | POA: Diagnosis not present

## 2017-11-19 DIAGNOSIS — S32010D Wedge compression fracture of first lumbar vertebra, subsequent encounter for fracture with routine healing: Secondary | ICD-10-CM | POA: Diagnosis not present

## 2017-11-20 ENCOUNTER — Encounter: Payer: Self-pay | Admitting: Cardiology

## 2017-11-20 ENCOUNTER — Ambulatory Visit (INDEPENDENT_AMBULATORY_CARE_PROVIDER_SITE_OTHER): Payer: Medicare Other | Admitting: Cardiology

## 2017-11-20 VITALS — BP 132/72 | HR 83 | Ht 66.0 in | Wt 172.0 lb

## 2017-11-20 DIAGNOSIS — M549 Dorsalgia, unspecified: Secondary | ICD-10-CM

## 2017-11-20 DIAGNOSIS — I5032 Chronic diastolic (congestive) heart failure: Secondary | ICD-10-CM

## 2017-11-20 DIAGNOSIS — I700 Atherosclerosis of aorta: Secondary | ICD-10-CM

## 2017-11-20 DIAGNOSIS — I4719 Other supraventricular tachycardia: Secondary | ICD-10-CM

## 2017-11-20 DIAGNOSIS — N289 Disorder of kidney and ureter, unspecified: Secondary | ICD-10-CM | POA: Diagnosis not present

## 2017-11-20 DIAGNOSIS — G8929 Other chronic pain: Secondary | ICD-10-CM

## 2017-11-20 DIAGNOSIS — R5383 Other fatigue: Secondary | ICD-10-CM | POA: Diagnosis not present

## 2017-11-20 DIAGNOSIS — I480 Paroxysmal atrial fibrillation: Secondary | ICD-10-CM | POA: Diagnosis not present

## 2017-11-20 DIAGNOSIS — I471 Supraventricular tachycardia: Secondary | ICD-10-CM

## 2017-11-20 DIAGNOSIS — I10 Essential (primary) hypertension: Secondary | ICD-10-CM

## 2017-11-20 NOTE — Progress Notes (Signed)
Cardiology Office Note   Date:  11/20/2017   ID:  Heather Benson, DOB 12-16-1931, MRN [id number]  PCP:  [name], MD  Cardiologist:  Dr. Irish Lack    Chief Complaint  Patient presents with  . Hospitalization Follow-up      History of Present Illness: Heather Benson is a 84 y.o. female who presents for post hospitalization 10/15/17 to 10/23/17 for acute L1 compression fx with pain.  Underwent kyphoplasty, of lumbar spine.  Pt was discharged to [facility]. She also had acute diverticulitis, AKI stage IV, atrial fib was in SR by exam - I do not find hospital EKGs.   She has a hx of paroxysmal atrial fibrillation, HTN, LE edema, GERD. She was seen in February 2016 and was back in atrial fibrillation. She was placed on amiodarone and eventually underwent cardioversion 08/05/14. She was seen in follow-up on 08/23/14. She felt no different and continued to have dizziness as well as intermittent palpitations. Dizziness was worse with standing up. She denied syncope. She was noted to be bradycardic and her bisoprolol was decreased.   In 5/16, she Continued to c/o dizziness and was bradycardic. Her beta-blocker was stopped. FU ECG with improved HR.   Renal dysfunction has been an issue. SHe does not want to do dialysis if it was ever needed.Followed by Dr. Lorrene Reid, and she adjusts lasix.     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  Her Amio was stopped a month before, and she was hypotensive, and seen by Dr. Harrington Challenger in 3/19.  She was in NSR at the time.    She has felt better since starting back Amiodarone.  She still has some dizziness and palpitations, but this has improved. She is on Eliquis.    Today she does not feel well due to her back pain. She has no chest pain, occ SOB but mostly due to pain with movement.  She is back at home now.  Her son and husband are here as well.  She is maintaining SR.  She has + bruising due to having to have help with sitting and standing.   She has  trouble ambulating and likes to stay in bed.  No fevers, no colds. She is to see PCP tomorrow and pain management on Monday.  Past Medical History:  Diagnosis Date  . Aortic atherosclerosis ([place]) 12/25/2016  . Aortic stenosis, mild 12/14/2015  . Arthritis 08-28-11   right knee pain-surgery planned  . Cancer [facility]) 08-28-11   Melonoma-cheek(many Yrs ago) , recent bx. left  face lesion, past skin cancer lesions  . CAP (community acquired pneumonia) 12/13/2015  . CHF (congestive heart failure) ([place])   . Complication of anesthesia 08-28-11   in past arrythmia-  cardiology has seen in past  . Dysrhythmia 08-28-11   only immed.  to several days after surgery- hx. A.fib. in past  . GERD (gastroesophageal reflux disease) 08-28-11   tx. omeprazole  . Hypercholesterolemia   . Hypertension 08-28-11   tx. meds  . IBS (irritable bowel syndrome)   . Lymphedema   . Neuromuscular disorder ([place]) 08-28-11   hx. Polio age 78-slight residual affects legs  . Osteoarthritis    back and left knee  . Osteoporosis   . Pneumonia 12/2015   hospital x 3 days  . PONV (postoperative nausea and vomiting)   . Renal cyst, right   . Sarcoidosis   . Swelling of extremity 08-28-11   bilateral lower extremities- mid calf down  Past Surgical History:  Procedure Laterality Date  . ABDOMINAL HYSTERECTOMY  08-28-11   Partial-vaginal approach  . BREAST REDUCTION SURGERY  1997  . BUNIONECTOMY  08-28-11   right  . CARDIOVERSION N/A 08/05/2014   Procedure: CARDIOVERSION;  Surgeon: Larey Dresser, MD;  Location: [facility];  Service: Cardiovascular;  Laterality: N/A;  . CATARACT EXTRACTION, BILATERAL  08-28-11   bilateral  . COLONOSCOPY WITH PROPOFOL N/A 12/24/2016   Procedure: COLONOSCOPY WITH PROPOFOL;  Surgeon: Wonda Horner, MD;  Location: [facility] ENDOSCOPY;  Service: Endoscopy;  Laterality: N/A;  . EYE SURGERY     Cataract  . IR VERTEBROPLASTY EA ADDL (T&LS) BX INC UNI/BIL INC INJECT/IMAGING  10/22/2017  . IR  VERTEBROPLASTY LUMBAR BX INC UNI/BIL INC/INJECT/IMAGING  10/22/2017  . JOINT REPLACEMENT  Oct. 1, 2012   Right Knee  . KNEE ARTHROSCOPY  09/06/2011   Procedure: ARTHROSCOPY KNEE;  Surgeon: Gearlean Alf, MD;  Location: [facility] ORS;  Service: Orthopedics;  Laterality: Right;  with Synovectomy     Current Outpatient Medications  Medication Sig Dispense Refill  . acetaminophen (TYLENOL) 500 MG tablet Take 1,000 mg by mouth every 8 (eight) hours as needed (for pain).     [name] amiodarone (PACERONE) 200 MG tablet Take 1 tablet (200 mg total) by mouth daily. 30 tablet 3  . atorvastatin (LIPITOR) 10 MG tablet Take 1 tablet (10 mg total) by mouth daily. 90 tablet 3  . beclomethasone (QVAR) 80 MCG/ACT inhaler Inhale 2 puffs into the lungs 2 (two) times daily as needed (for flares).     . bisacodyl (DULCOLAX) 10 MG suppository Place 1 suppository (10 mg total) rectally daily as needed for moderate constipation. 12 suppository   . ELIQUIS 2.5 MG TABS tablet TAKE 1 TABLET BY MOUTH TWICE A DAY 60 tablet 9  . fexofenadine (ALLEGRA) 180 MG tablet Take 180 mg by mouth daily as needed for allergies.     . fluticasone (FLONASE) 50 MCG/ACT nasal spray Place 1 spray into both nostrils daily as needed for allergies.     . furosemide (LASIX) 40 MG tablet Take 40 mg by mouth See admin instructions. Take 40 mg by mouth once a day and may take a second dose of 40 mg if needed for swelling    . gabapentin (NEURONTIN) 100 MG capsule Take 1 capsule (100 mg total) by mouth at bedtime.    [name] guaiFENesin (MUCINEX) 600 MG 12 hr tablet Take 600 mg by mouth 2 (two) times daily as needed for cough or to loosen phlegm.     . latanoprost (XALATAN) 0.005 % ophthalmic solution Place 1 drop into both eyes at bedtime.  3  . meclizine (ANTIVERT) 25 MG tablet Take 12.5 mg by mouth 3 (three) times daily as needed for dizziness.   0  . oxyCODONE (OXY IR/ROXICODONE) 5 MG immediate release tablet Take 1 tablet (5 mg total) by mouth every 6 (six)  hours as needed for severe pain. 20 tablet 0  . polyethylene glycol (MIRALAX / GLYCOLAX) packet Take 17 g by mouth 2 (two) times daily.    . potassium chloride (K-DUR) 10 MEQ tablet Take 10 mEq by mouth daily.    . predniSONE (DELTASONE) 10 MG tablet Take 10 mg by mouth daily with breakfast.    . Respiratory Therapy Supplies (FLUTTER) DEVI Use as directed. 1 each 0  . senna (SENOKOT) 8.6 MG TABS tablet Take 1 tablet (8.6 mg total) by mouth at bedtime.     No current  facility-administered medications for this visit.     Allergies:   Augmentin [amoxicillin-pot clavulanate]; Boniva [ibandronic acid]; Codeine; Hydromorphone; Latex; Lisinopril; and Adhesive [tape]    Social History:  The patient  reports that she has never smoked. She has never used smokeless tobacco. She reports that she does not drink alcohol or use drugs.   Family History:  The patient's family history includes Breast cancer in her sister; Colon cancer in her mother; Emphysema in her father; Heart attack in her brother; Heart disease in her father and mother; Hyperlipidemia in her mother; Hypertension in her father and mother; Stroke in her maternal grandmother.    ROS:  General:no colds or fevers, no weight changes she was unable to be weighed today due to back pain. Skin:no rashes or ulcers, + bruises of upper extremities.  HEENT:no blurred vision, no congestion CV:see HPI PUL:see HPI GI:no diarrhea constipation or melena, no indigestion GU:no hematuria, no dysuria MS:no joint pain, no claudication Neuro:no syncope, no lightheadedness Endo:no diabetes, no thyroid disease  Wt Readings from Last 3 Encounters:  11/20/17 172 lb (78 kg)  10/22/17 177 lb 7.5 oz (80.5 kg)  07/24/17 189 lb (85.7 kg)     PHYSICAL EXAM: VS:  BP 132/72   Pulse 83   Ht 5\' 6"  (1.676 m)   Wt 172 lb (78 kg)   SpO2 95%   BMI 27.76 kg/m  , BMI Body mass index is 27.76 kg/m. General:Pleasant affect, NAD Skin:Warm and dry, brisk capillary  refill HEENT:normocephalic, sclera clear, mucus membranes moist Neck:supple, no JVD, no bruits  Heart:S1S2 RRR without murmur, gallup, rub or click Lungs:clear without rales, rhonchi, or wheezes CHE:NIDP, non tender, + BS, do not palpate liver spleen or masses Ext:no lower ext edema, 2+ pedal pulses, 2+ radial pulses Neuro:alert and oriented, MAE, follows commands, + facial symmetry    EKG:  EKG is ordered today. The ekg ordered today demonstrates SR LAD and no acute changes   Recent Labs: 12/23/2016: Magnesium 2.1 09/02/2017: ALT 24; TSH 1.260 10/15/2017: B Natriuretic Peptide 141.8 10/22/2017: BUN 29; Creatinine, Ser 2.02; Potassium 3.9; Sodium 143 10/23/2017: Hemoglobin 16.3; Platelets 219    Lipid Panel    Component Value Date/Time   CHOL 256 (H) 09/02/2017 0852   TRIG 115 09/02/2017 0852   HDL 78 09/02/2017 0852   CHOLHDL 3.3 09/02/2017 0852   CHOLHDL 3.3 05/27/2010 0505   VLDL 19 05/27/2010 0505   LDLCALC 155 (H) 09/02/2017 8242       Other studies Reviewed: Additional studies/ records that were reviewed today include: .  ECHO 12/14/15 Study Conclusions  - Left ventricle: The cavity size was normal. There was moderate   concentric hypertrophy. Systolic function was normal. The   estimated ejection fraction was in the range of 55% to 60%. Left   ventricular diastolic function parameters were normal. - Aortic valve: There was very mild stenosis. There was trivial   regurgitation. Valve area (VTI): 1.23 cm^2. Valve area (Vmax):   1.51 cm^2. Valve area (Vmean): 1.27 cm^2. - Mitral valve: Calcified annulus. There was mild regurgitation. - Atrial septum: No defect or patent foramen ovale was identified  ASSESSMENT AND PLAN:  1.  PAF  Maintaining SR on Eliquis and amiodarone need to check LFTs CMP today follow up with Dr. Irish Lack in 6 months   2.  SVT no further episodes  3.   HTN controlled  4.   CKD stable will check Cr today   5.   Aortic atherosclerosis  check lipids today  6.   L1 compression with kyphoplasty for pain clinic Monday  7.   Lesions of both kidneys- majority are cysts--PCP to follow   Current medicines are reviewed with the patient today.  The patient Has no concerns regarding medicines.  The following changes have been made:  See above Labs/ tests ordered today include:see above  Disposition:   FU:  see above  Signed, Cecilie Kicks, NP  11/20/2017 11:38 AM    [address], [place], [address] [place], [place] Phone: [phone]; Fax: [phone]

## 2017-11-20 NOTE — Patient Instructions (Signed)
Your physician recommends that you continue on your current medications as directed. Please refer to the Current Medication list given to you today.   Your physician recommends that you return for lab work in:  [place] physician wants you to follow-up in: [place] will receive a reminder letter in the mail two months in advance. If you don't receive a letter, please call our office to schedule the follow-up appointment.

## 2017-11-21 DIAGNOSIS — E440 Moderate protein-calorie malnutrition: Secondary | ICD-10-CM | POA: Diagnosis not present

## 2017-11-21 DIAGNOSIS — I480 Paroxysmal atrial fibrillation: Secondary | ICD-10-CM | POA: Diagnosis not present

## 2017-11-21 DIAGNOSIS — M545 Low back pain: Secondary | ICD-10-CM | POA: Diagnosis not present

## 2017-11-21 DIAGNOSIS — F321 Major depressive disorder, single episode, moderate: Secondary | ICD-10-CM | POA: Diagnosis not present

## 2017-11-21 DIAGNOSIS — N184 Chronic kidney disease, stage 4 (severe): Secondary | ICD-10-CM | POA: Diagnosis not present

## 2017-11-21 LAB — COMPREHENSIVE METABOLIC PANEL
A/G RATIO: 1.2 (ref 1.2–2.2)
ALBUMIN: 3.8 g/dL (ref 3.5–4.7)
ALT: 19 IU/L (ref 0–32)
AST: 23 IU/L (ref 0–40)
Alkaline Phosphatase: 88 IU/L (ref 39–117)
BILIRUBIN TOTAL: 1 mg/dL (ref 0.0–1.2)
BUN / CREAT RATIO: 14 (ref 12–28)
BUN: 31 mg/dL — AB (ref 8–27)
CO2: 26 mmol/L (ref 20–29)
Calcium: 9.9 mg/dL (ref 8.7–10.3)
Chloride: 95 mmol/L — ABNORMAL LOW (ref 96–106)
Creatinine, Ser: 2.2 mg/dL — ABNORMAL HIGH (ref 0.57–1.00)
GFR calc non Af Amer: 20 mL/min/{1.73_m2} — ABNORMAL LOW (ref >59)
GFR, EST AFRICAN AMERICAN: 23 mL/min/{1.73_m2} — AB (ref >59)
GLOBULIN, TOTAL: 3.2 g/dL (ref 1.5–4.5)
Glucose: 99 mg/dL (ref 65–99)
POTASSIUM: 4.8 mmol/L (ref 3.5–5.2)
Sodium: 144 mmol/L (ref 134–144)
TOTAL PROTEIN: 7 g/dL (ref 6.0–8.5)

## 2017-11-21 LAB — CBC WITH DIFFERENTIAL/PLATELET
BASOS ABS: 0 10*3/uL (ref 0.0–0.2)
Basos: 0 %
EOS (ABSOLUTE): 0.1 10*3/uL (ref 0.0–0.4)
Eos: 1 %
Hematocrit: 49.8 % — ABNORMAL HIGH (ref 34.0–46.6)
Hemoglobin: 16.9 g/dL — ABNORMAL HIGH (ref 11.1–15.9)
IMMATURE GRANS (ABS): 0.1 10*3/uL (ref 0.0–0.1)
Immature Granulocytes: 1 %
LYMPHS ABS: 1.4 10*3/uL (ref 0.7–3.1)
Lymphs: 9 %
MCH: 31.5 pg (ref 26.6–33.0)
MCHC: 33.9 g/dL (ref 31.5–35.7)
MCV: 93 fL (ref 79–97)
MONOCYTES: 5 %
Monocytes Absolute: 0.8 10*3/uL (ref 0.1–0.9)
NEUTROS ABS: 12.3 10*3/uL — AB (ref 1.4–7.0)
Neutrophils: 84 %
PLATELETS: 223 10*3/uL (ref 150–450)
RBC: 5.37 x10E6/uL — AB (ref 3.77–5.28)
RDW: 17.7 % — ABNORMAL HIGH (ref 12.3–15.4)
WBC: 14.6 10*3/uL — ABNORMAL HIGH (ref 3.4–10.8)

## 2017-11-21 LAB — LIPID PANEL
CHOLESTEROL TOTAL: 211 mg/dL — AB (ref 100–199)
Chol/HDL Ratio: 1.9 ratio (ref 0.0–4.4)
HDL: 113 mg/dL (ref >39)
LDL Calculated: 71 mg/dL (ref 0–99)
Triglycerides: 136 mg/dL (ref 0–149)
VLDL Cholesterol Cal: 27 mg/dL (ref 5–40)

## 2017-11-24 ENCOUNTER — Telehealth: Payer: Self-pay | Admitting: Cardiology

## 2017-11-24 DIAGNOSIS — M5136 Other intervertebral disc degeneration, lumbar region: Secondary | ICD-10-CM | POA: Diagnosis not present

## 2017-11-24 DIAGNOSIS — M47817 Spondylosis without myelopathy or radiculopathy, lumbosacral region: Secondary | ICD-10-CM | POA: Diagnosis not present

## 2017-11-24 DIAGNOSIS — G894 Chronic pain syndrome: Secondary | ICD-10-CM | POA: Diagnosis not present

## 2017-11-24 DIAGNOSIS — M533 Sacrococcygeal disorders, not elsewhere classified: Secondary | ICD-10-CM | POA: Diagnosis not present

## 2017-11-24 MED ORDER — POTASSIUM CHLORIDE ER 10 MEQ PO TBCR: EXTENDED_RELEASE_TABLET | ORAL | 3 refills | Status: DC

## 2017-11-24 MED ORDER — FUROSEMIDE 20 MG PO TABS: ORAL_TABLET | ORAL | 3 refills | Status: DC

## 2017-11-24 NOTE — Telephone Encounter (Signed)
New Message      Patient daughter in law returning call for results. (on dpr).

## 2017-11-24 NOTE — Telephone Encounter (Signed)
Spoke with daughter in law, [place], [place] on file. Made her aware of results and recommendations per Cecilie Kicks, NP. Nola verbalized understanding and was in agreement with this plan. Pt also seeing Nephro this week.

## 2017-11-25 DIAGNOSIS — J45909 Unspecified asthma, uncomplicated: Secondary | ICD-10-CM | POA: Diagnosis not present

## 2017-11-25 DIAGNOSIS — I4891 Unspecified atrial fibrillation: Secondary | ICD-10-CM | POA: Diagnosis not present

## 2017-11-25 DIAGNOSIS — S32040D Wedge compression fracture of fourth lumbar vertebra, subsequent encounter for fracture with routine healing: Secondary | ICD-10-CM | POA: Diagnosis not present

## 2017-11-25 DIAGNOSIS — I129 Hypertensive chronic kidney disease with stage 1 through stage 4 chronic kidney disease, or unspecified chronic kidney disease: Secondary | ICD-10-CM | POA: Diagnosis not present

## 2017-11-25 DIAGNOSIS — N184 Chronic kidney disease, stage 4 (severe): Secondary | ICD-10-CM | POA: Diagnosis not present

## 2017-11-25 DIAGNOSIS — S32010D Wedge compression fracture of first lumbar vertebra, subsequent encounter for fracture with routine healing: Secondary | ICD-10-CM | POA: Diagnosis not present

## 2017-11-26 DIAGNOSIS — S32040A Wedge compression fracture of fourth lumbar vertebra, initial encounter for closed fracture: Secondary | ICD-10-CM | POA: Diagnosis not present

## 2017-11-26 DIAGNOSIS — N184 Chronic kidney disease, stage 4 (severe): Secondary | ICD-10-CM | POA: Diagnosis not present

## 2017-11-26 DIAGNOSIS — J45909 Unspecified asthma, uncomplicated: Secondary | ICD-10-CM | POA: Diagnosis not present

## 2017-11-26 DIAGNOSIS — S32040D Wedge compression fracture of fourth lumbar vertebra, subsequent encounter for fracture with routine healing: Secondary | ICD-10-CM | POA: Diagnosis not present

## 2017-11-26 DIAGNOSIS — I4891 Unspecified atrial fibrillation: Secondary | ICD-10-CM | POA: Diagnosis not present

## 2017-11-26 DIAGNOSIS — D869 Sarcoidosis, unspecified: Secondary | ICD-10-CM | POA: Diagnosis not present

## 2017-11-26 DIAGNOSIS — I480 Paroxysmal atrial fibrillation: Secondary | ICD-10-CM | POA: Diagnosis not present

## 2017-11-26 DIAGNOSIS — S32010A Wedge compression fracture of first lumbar vertebra, initial encounter for closed fracture: Secondary | ICD-10-CM | POA: Diagnosis not present

## 2017-11-26 DIAGNOSIS — I129 Hypertensive chronic kidney disease with stage 1 through stage 4 chronic kidney disease, or unspecified chronic kidney disease: Secondary | ICD-10-CM | POA: Diagnosis not present

## 2017-11-26 DIAGNOSIS — S32010D Wedge compression fracture of first lumbar vertebra, subsequent encounter for fracture with routine healing: Secondary | ICD-10-CM | POA: Diagnosis not present

## 2017-12-03 DIAGNOSIS — M47817 Spondylosis without myelopathy or radiculopathy, lumbosacral region: Secondary | ICD-10-CM | POA: Diagnosis not present

## 2017-12-04 DIAGNOSIS — N184 Chronic kidney disease, stage 4 (severe): Secondary | ICD-10-CM | POA: Diagnosis not present

## 2017-12-04 DIAGNOSIS — S32040D Wedge compression fracture of fourth lumbar vertebra, subsequent encounter for fracture with routine healing: Secondary | ICD-10-CM | POA: Diagnosis not present

## 2017-12-04 DIAGNOSIS — I4891 Unspecified atrial fibrillation: Secondary | ICD-10-CM | POA: Diagnosis not present

## 2017-12-04 DIAGNOSIS — J45909 Unspecified asthma, uncomplicated: Secondary | ICD-10-CM | POA: Diagnosis not present

## 2017-12-04 DIAGNOSIS — I129 Hypertensive chronic kidney disease with stage 1 through stage 4 chronic kidney disease, or unspecified chronic kidney disease: Secondary | ICD-10-CM | POA: Diagnosis not present

## 2017-12-04 DIAGNOSIS — S32010D Wedge compression fracture of first lumbar vertebra, subsequent encounter for fracture with routine healing: Secondary | ICD-10-CM | POA: Diagnosis not present

## 2017-12-05 DIAGNOSIS — M545 Low back pain: Secondary | ICD-10-CM | POA: Diagnosis not present

## 2017-12-08 ENCOUNTER — Other Ambulatory Visit: Payer: Self-pay | Admitting: Physician Assistant

## 2017-12-08 ENCOUNTER — Telehealth: Payer: Self-pay | Admitting: Interventional Cardiology

## 2017-12-08 ENCOUNTER — Other Ambulatory Visit: Payer: Medicare Other

## 2017-12-08 DIAGNOSIS — M545 Low back pain: Principal | ICD-10-CM

## 2017-12-08 DIAGNOSIS — S32010D Wedge compression fracture of first lumbar vertebra, subsequent encounter for fracture with routine healing: Secondary | ICD-10-CM | POA: Diagnosis not present

## 2017-12-08 DIAGNOSIS — S32040D Wedge compression fracture of fourth lumbar vertebra, subsequent encounter for fracture with routine healing: Secondary | ICD-10-CM | POA: Diagnosis not present

## 2017-12-08 DIAGNOSIS — I4891 Unspecified atrial fibrillation: Secondary | ICD-10-CM | POA: Diagnosis not present

## 2017-12-08 DIAGNOSIS — N184 Chronic kidney disease, stage 4 (severe): Secondary | ICD-10-CM | POA: Diagnosis not present

## 2017-12-08 DIAGNOSIS — J45909 Unspecified asthma, uncomplicated: Secondary | ICD-10-CM | POA: Diagnosis not present

## 2017-12-08 DIAGNOSIS — I129 Hypertensive chronic kidney disease with stage 1 through stage 4 chronic kidney disease, or unspecified chronic kidney disease: Secondary | ICD-10-CM | POA: Diagnosis not present

## 2017-12-08 DIAGNOSIS — G8929 Other chronic pain: Secondary | ICD-10-CM

## 2017-12-08 NOTE — Telephone Encounter (Signed)
New message    Pt husband wants to know if pt needs to come in for lab work. She was here on 7/11 and had lab work then. Please call.

## 2017-12-08 NOTE — Telephone Encounter (Signed)
Result Notes for Lipid panel   Notes recorded by Loren Racer, LPN on 2/54/2706 at 2:37 PM EDT Spoke with daughter in law, [place], [place] on file. Made her aware of results and recommendations per Cecilie Kicks, NP. Heather Benson verbalized understanding and was in agreement with this plan. Pt also seeing Nephro this week. ------  Notes recorded by Richmond Campbell, LPN on 11/07/3149 at 76:16 PM EDT LM TO CALL BACK ./CY ------  Notes recorded by Isaiah Serge, NP on 11/21/2017 at 7:57 AM EDT Decrease lasix to 20 mg alt 40 meq and hold K+ on 20 meq days. Kidney function was more stressed. If swelling increases call the office. I sent a copy to her PCP. Cholesterol is great no changes with lipitor - white count is up but could be due to prednisone. If any fever go to ER.

## 2017-12-08 NOTE — Telephone Encounter (Signed)
Informed the pts husband (on dpr), that the pt does NOT need to come in for lab today. Endorsed to the pts husband that Cecilie Kicks NP already checked her labs on 7/11, at her OV with the pt. Went over lab results with the pts husband, as indicated in this message. Husband verbalized understanding and agrees with this plan. Cancelled the pts lab appt for today.

## 2017-12-09 DIAGNOSIS — I4891 Unspecified atrial fibrillation: Secondary | ICD-10-CM | POA: Diagnosis not present

## 2017-12-09 DIAGNOSIS — S32040D Wedge compression fracture of fourth lumbar vertebra, subsequent encounter for fracture with routine healing: Secondary | ICD-10-CM | POA: Diagnosis not present

## 2017-12-09 DIAGNOSIS — J45909 Unspecified asthma, uncomplicated: Secondary | ICD-10-CM | POA: Diagnosis not present

## 2017-12-09 DIAGNOSIS — I129 Hypertensive chronic kidney disease with stage 1 through stage 4 chronic kidney disease, or unspecified chronic kidney disease: Secondary | ICD-10-CM | POA: Diagnosis not present

## 2017-12-09 DIAGNOSIS — N184 Chronic kidney disease, stage 4 (severe): Secondary | ICD-10-CM | POA: Diagnosis not present

## 2017-12-09 DIAGNOSIS — S32010D Wedge compression fracture of first lumbar vertebra, subsequent encounter for fracture with routine healing: Secondary | ICD-10-CM | POA: Diagnosis not present

## 2017-12-11 DIAGNOSIS — N184 Chronic kidney disease, stage 4 (severe): Secondary | ICD-10-CM | POA: Diagnosis not present

## 2017-12-11 DIAGNOSIS — S32010D Wedge compression fracture of first lumbar vertebra, subsequent encounter for fracture with routine healing: Secondary | ICD-10-CM | POA: Diagnosis not present

## 2017-12-11 DIAGNOSIS — I4891 Unspecified atrial fibrillation: Secondary | ICD-10-CM | POA: Diagnosis not present

## 2017-12-11 DIAGNOSIS — J45909 Unspecified asthma, uncomplicated: Secondary | ICD-10-CM | POA: Diagnosis not present

## 2017-12-11 DIAGNOSIS — S32040D Wedge compression fracture of fourth lumbar vertebra, subsequent encounter for fracture with routine healing: Secondary | ICD-10-CM | POA: Diagnosis not present

## 2017-12-11 DIAGNOSIS — I129 Hypertensive chronic kidney disease with stage 1 through stage 4 chronic kidney disease, or unspecified chronic kidney disease: Secondary | ICD-10-CM | POA: Diagnosis not present

## 2017-12-12 ENCOUNTER — Ambulatory Visit
Admission: RE | Admit: 2017-12-12 | Discharge: 2017-12-12 | Disposition: A | Discharge status: Home / Self Care | Payer: Medicare Other | Source: Ambulatory Visit | Attending: Physician Assistant | Admitting: Physician Assistant

## 2017-12-12 DIAGNOSIS — M545 Low back pain: Secondary | ICD-10-CM | POA: Diagnosis not present

## 2017-12-12 DIAGNOSIS — G8929 Other chronic pain: Secondary | ICD-10-CM

## 2017-12-15 DIAGNOSIS — N184 Chronic kidney disease, stage 4 (severe): Secondary | ICD-10-CM | POA: Diagnosis not present

## 2017-12-15 DIAGNOSIS — S32040D Wedge compression fracture of fourth lumbar vertebra, subsequent encounter for fracture with routine healing: Secondary | ICD-10-CM | POA: Diagnosis not present

## 2017-12-15 DIAGNOSIS — M545 Low back pain: Secondary | ICD-10-CM | POA: Diagnosis not present

## 2017-12-15 DIAGNOSIS — I4891 Unspecified atrial fibrillation: Secondary | ICD-10-CM | POA: Diagnosis not present

## 2017-12-15 DIAGNOSIS — J45909 Unspecified asthma, uncomplicated: Secondary | ICD-10-CM | POA: Diagnosis not present

## 2017-12-15 DIAGNOSIS — I129 Hypertensive chronic kidney disease with stage 1 through stage 4 chronic kidney disease, or unspecified chronic kidney disease: Secondary | ICD-10-CM | POA: Diagnosis not present

## 2017-12-15 DIAGNOSIS — S32010D Wedge compression fracture of first lumbar vertebra, subsequent encounter for fracture with routine healing: Secondary | ICD-10-CM | POA: Diagnosis not present

## 2017-12-17 DIAGNOSIS — I129 Hypertensive chronic kidney disease with stage 1 through stage 4 chronic kidney disease, or unspecified chronic kidney disease: Secondary | ICD-10-CM | POA: Diagnosis not present

## 2017-12-17 DIAGNOSIS — J45909 Unspecified asthma, uncomplicated: Secondary | ICD-10-CM | POA: Diagnosis not present

## 2017-12-17 DIAGNOSIS — S32040D Wedge compression fracture of fourth lumbar vertebra, subsequent encounter for fracture with routine healing: Secondary | ICD-10-CM | POA: Diagnosis not present

## 2017-12-17 DIAGNOSIS — I4891 Unspecified atrial fibrillation: Secondary | ICD-10-CM | POA: Diagnosis not present

## 2017-12-17 DIAGNOSIS — S32010D Wedge compression fracture of first lumbar vertebra, subsequent encounter for fracture with routine healing: Secondary | ICD-10-CM | POA: Diagnosis not present

## 2017-12-17 DIAGNOSIS — N184 Chronic kidney disease, stage 4 (severe): Secondary | ICD-10-CM | POA: Diagnosis not present

## 2017-12-20 ENCOUNTER — Other Ambulatory Visit: Payer: Self-pay | Admitting: Interventional Cardiology

## 2017-12-22 NOTE — Telephone Encounter (Signed)
Pt last saw Cecilie Kicks, NP on 11/20/17, last labs 11/20/17 Creat 2.20, age 84, weight 78kg, based on specified criteria pt is on appropriate dosage of Elqiuis 2.5mg  BID.  Will refill rx.

## 2017-12-23 ENCOUNTER — Telehealth: Payer: Self-pay

## 2017-12-23 NOTE — Telephone Encounter (Signed)
   [facility] Pre-operative Risk Assessment    Request for surgical clearance:  1. What type of surgery is being performed? Kyphoplasty   2. When is this surgery scheduled? 12/29/17   3. What type of clearance is required (medical clearance vs. Pharmacy clearance to hold med vs. Both)? Pharmacy clearance to hold med  4. Are there any medications that need to be held prior to surgery and how long? Blood Thinnner x5 days. Pt is on Eliquis  5. Practice name and name of physician performing surgery? [facility] - Dr. Shanon Brow L. Spivey   6. What is your office phone number [phone]   7.   What is your office fax number [phone]  8.   Anesthesia type (None, local, MAC, general) ? None Listed   Jacinta Shoe 12/23/2017, 11:12 AM  _________________________________________________________________   (provider comments below)

## 2017-12-24 DIAGNOSIS — I129 Hypertensive chronic kidney disease with stage 1 through stage 4 chronic kidney disease, or unspecified chronic kidney disease: Secondary | ICD-10-CM | POA: Diagnosis not present

## 2017-12-24 DIAGNOSIS — S32010D Wedge compression fracture of first lumbar vertebra, subsequent encounter for fracture with routine healing: Secondary | ICD-10-CM | POA: Diagnosis not present

## 2017-12-24 DIAGNOSIS — I4891 Unspecified atrial fibrillation: Secondary | ICD-10-CM | POA: Diagnosis not present

## 2017-12-24 DIAGNOSIS — J45909 Unspecified asthma, uncomplicated: Secondary | ICD-10-CM | POA: Diagnosis not present

## 2017-12-24 DIAGNOSIS — S32040D Wedge compression fracture of fourth lumbar vertebra, subsequent encounter for fracture with routine healing: Secondary | ICD-10-CM | POA: Diagnosis not present

## 2017-12-24 DIAGNOSIS — N184 Chronic kidney disease, stage 4 (severe): Secondary | ICD-10-CM | POA: Diagnosis not present

## 2017-12-24 NOTE — Telephone Encounter (Signed)
   Primary Cardiologist: Larae Grooms, MD  Chart reviewed as part of pre-operative protocol coverage. Given past medical history and time since last visit, based on ACC/AHA guidelines, Heather Benson would be at acceptable risk for the planned procedure without further cardiovascular testing.   Hold Eliquis 4 days pre op due to reduced renal function.   I will route this recommendation to the requesting party via Epic fax function and remove from pre-op pool.  Please call with questions.  Kerin Ransom, PA-C 12/24/2017, 4:26 PM

## 2017-12-24 NOTE — Telephone Encounter (Signed)
She should be OK for surgery from cardiology standpoint just saw Cecilie Kicks 11/20/17. Just need recommendations on Eliquis from pharmacy.  Merrillyn Ackerley PA-C 12/24/2017 1:25 PM

## 2017-12-24 NOTE — Telephone Encounter (Signed)
Pt takes Eliquis for afib with CHADS2VASc score of 6 (age x2, sex, HTN, CHF, CAD). CrCl is 23mL/min. Recommend holding Eliquis for 4 days prior due to reduced renal function.

## 2017-12-26 DIAGNOSIS — S32000G Wedge compression fracture of unspecified lumbar vertebra, subsequent encounter for fracture with delayed healing: Secondary | ICD-10-CM | POA: Diagnosis not present

## 2017-12-26 DIAGNOSIS — F324 Major depressive disorder, single episode, in partial remission: Secondary | ICD-10-CM | POA: Diagnosis not present

## 2017-12-26 DIAGNOSIS — E440 Moderate protein-calorie malnutrition: Secondary | ICD-10-CM | POA: Diagnosis not present

## 2017-12-29 DIAGNOSIS — S32020A Wedge compression fracture of second lumbar vertebra, initial encounter for closed fracture: Secondary | ICD-10-CM | POA: Diagnosis not present

## 2018-01-01 DIAGNOSIS — L821 Other seborrheic keratosis: Secondary | ICD-10-CM | POA: Diagnosis not present

## 2018-01-01 DIAGNOSIS — D0462 Carcinoma in situ of skin of left upper limb, including shoulder: Secondary | ICD-10-CM | POA: Diagnosis not present

## 2018-01-01 DIAGNOSIS — C44622 Squamous cell carcinoma of skin of right upper limb, including shoulder: Secondary | ICD-10-CM | POA: Diagnosis not present

## 2018-01-01 DIAGNOSIS — D485 Neoplasm of uncertain behavior of skin: Secondary | ICD-10-CM | POA: Diagnosis not present

## 2018-01-01 DIAGNOSIS — Z85828 Personal history of other malignant neoplasm of skin: Secondary | ICD-10-CM | POA: Diagnosis not present

## 2018-01-06 ENCOUNTER — Other Ambulatory Visit (HOSPITAL_COMMUNITY): Payer: Medicare Other

## 2018-01-14 ENCOUNTER — Ambulatory Visit: Admit: 2018-01-14 | Payer: Medicare Other | Admitting: Orthopedic Surgery

## 2018-01-14 DIAGNOSIS — G894 Chronic pain syndrome: Secondary | ICD-10-CM | POA: Diagnosis not present

## 2018-01-14 DIAGNOSIS — M5136 Other intervertebral disc degeneration, lumbar region: Secondary | ICD-10-CM | POA: Diagnosis not present

## 2018-01-14 DIAGNOSIS — M47817 Spondylosis without myelopathy or radiculopathy, lumbosacral region: Secondary | ICD-10-CM | POA: Diagnosis not present

## 2018-01-14 DIAGNOSIS — M545 Low back pain: Secondary | ICD-10-CM | POA: Diagnosis not present

## 2018-01-14 SURGERY — KYPHOPLASTY
Anesthesia: General

## 2018-01-20 ENCOUNTER — Other Ambulatory Visit: Payer: Self-pay | Admitting: Pain Medicine

## 2018-01-20 ENCOUNTER — Ambulatory Visit
Admission: RE | Admit: 2018-01-20 | Discharge: 2018-01-20 | Disposition: A | Discharge status: Home / Self Care | Payer: Medicare Other | Source: Ambulatory Visit | Attending: Pain Medicine | Admitting: Pain Medicine

## 2018-01-20 ENCOUNTER — Other Ambulatory Visit: Payer: Self-pay | Admitting: Family Medicine

## 2018-01-20 ENCOUNTER — Ambulatory Visit
Admission: RE | Admit: 2018-01-20 | Discharge: 2018-01-20 | Disposition: A | Discharge status: Home / Self Care | Payer: Medicare Other | Source: Ambulatory Visit | Attending: Family Medicine | Admitting: Family Medicine

## 2018-01-20 DIAGNOSIS — T148XXA Other injury of unspecified body region, initial encounter: Secondary | ICD-10-CM

## 2018-01-20 DIAGNOSIS — M545 Low back pain, unspecified: Secondary | ICD-10-CM

## 2018-01-20 DIAGNOSIS — M48061 Spinal stenosis, lumbar region without neurogenic claudication: Secondary | ICD-10-CM | POA: Diagnosis not present

## 2018-01-20 DIAGNOSIS — G8929 Other chronic pain: Secondary | ICD-10-CM

## 2018-01-21 ENCOUNTER — Other Ambulatory Visit: Payer: Self-pay | Admitting: Pain Medicine

## 2018-01-22 ENCOUNTER — Other Ambulatory Visit: Payer: Self-pay | Admitting: Pain Medicine

## 2018-01-26 ENCOUNTER — Encounter (HOSPITAL_COMMUNITY): Payer: Self-pay | Admitting: Emergency Medicine

## 2018-01-26 ENCOUNTER — Inpatient Hospital Stay (HOSPITAL_COMMUNITY): Payer: Medicare Other

## 2018-01-26 ENCOUNTER — Inpatient Hospital Stay (HOSPITAL_COMMUNITY)
Admission: EM | Admit: 2018-01-26 | Discharge: 2018-02-03 | DRG: 202 | Disposition: A | Discharge status: Skilled Nursing Facility | Payer: Medicare Other | Attending: Internal Medicine | Admitting: Internal Medicine

## 2018-01-26 ENCOUNTER — Other Ambulatory Visit: Payer: Self-pay

## 2018-01-26 ENCOUNTER — Emergency Department (HOSPITAL_COMMUNITY): Payer: Medicare Other

## 2018-01-26 DIAGNOSIS — I361 Nonrheumatic tricuspid (valve) insufficiency: Secondary | ICD-10-CM | POA: Diagnosis not present

## 2018-01-26 DIAGNOSIS — Z7901 Long term (current) use of anticoagulants: Secondary | ICD-10-CM

## 2018-01-26 DIAGNOSIS — E870 Hyperosmolality and hypernatremia: Secondary | ICD-10-CM | POA: Diagnosis present

## 2018-01-26 DIAGNOSIS — R42 Dizziness and giddiness: Secondary | ICD-10-CM | POA: Diagnosis not present

## 2018-01-26 DIAGNOSIS — I5032 Chronic diastolic (congestive) heart failure: Secondary | ICD-10-CM | POA: Diagnosis not present

## 2018-01-26 DIAGNOSIS — R918 Other nonspecific abnormal finding of lung field: Secondary | ICD-10-CM | POA: Diagnosis not present

## 2018-01-26 DIAGNOSIS — M810 Age-related osteoporosis without current pathological fracture: Secondary | ICD-10-CM | POA: Diagnosis present

## 2018-01-26 DIAGNOSIS — J209 Acute bronchitis, unspecified: Secondary | ICD-10-CM | POA: Diagnosis not present

## 2018-01-26 DIAGNOSIS — A419 Sepsis, unspecified organism: Secondary | ICD-10-CM | POA: Diagnosis not present

## 2018-01-26 DIAGNOSIS — R05 Cough: Secondary | ICD-10-CM

## 2018-01-26 DIAGNOSIS — I959 Hypotension, unspecified: Secondary | ICD-10-CM | POA: Diagnosis present

## 2018-01-26 DIAGNOSIS — K219 Gastro-esophageal reflux disease without esophagitis: Secondary | ICD-10-CM | POA: Diagnosis present

## 2018-01-26 DIAGNOSIS — I10 Essential (primary) hypertension: Secondary | ICD-10-CM | POA: Diagnosis not present

## 2018-01-26 DIAGNOSIS — I420 Dilated cardiomyopathy: Secondary | ICD-10-CM | POA: Diagnosis present

## 2018-01-26 DIAGNOSIS — R059 Cough, unspecified: Secondary | ICD-10-CM

## 2018-01-26 DIAGNOSIS — R0902 Hypoxemia: Secondary | ICD-10-CM

## 2018-01-26 DIAGNOSIS — S32030A Wedge compression fracture of third lumbar vertebra, initial encounter for closed fracture: Secondary | ICD-10-CM | POA: Diagnosis not present

## 2018-01-26 DIAGNOSIS — I21A1 Myocardial infarction type 2: Secondary | ICD-10-CM | POA: Diagnosis present

## 2018-01-26 DIAGNOSIS — E440 Moderate protein-calorie malnutrition: Secondary | ICD-10-CM | POA: Diagnosis not present

## 2018-01-26 DIAGNOSIS — J9601 Acute respiratory failure with hypoxia: Secondary | ICD-10-CM | POA: Diagnosis present

## 2018-01-26 DIAGNOSIS — Z888 Allergy status to other drugs, medicaments and biological substances status: Secondary | ICD-10-CM

## 2018-01-26 DIAGNOSIS — N179 Acute kidney failure, unspecified: Secondary | ICD-10-CM | POA: Diagnosis present

## 2018-01-26 DIAGNOSIS — I428 Other cardiomyopathies: Secondary | ICD-10-CM | POA: Diagnosis not present

## 2018-01-26 DIAGNOSIS — Z8612 Personal history of poliomyelitis: Secondary | ICD-10-CM

## 2018-01-26 DIAGNOSIS — I447 Left bundle-branch block, unspecified: Secondary | ICD-10-CM | POA: Diagnosis present

## 2018-01-26 DIAGNOSIS — S8001XA Contusion of right knee, initial encounter: Secondary | ICD-10-CM | POA: Diagnosis not present

## 2018-01-26 DIAGNOSIS — I4510 Unspecified right bundle-branch block: Secondary | ICD-10-CM | POA: Diagnosis not present

## 2018-01-26 DIAGNOSIS — R58 Hemorrhage, not elsewhere classified: Secondary | ICD-10-CM | POA: Diagnosis not present

## 2018-01-26 DIAGNOSIS — T380X5A Adverse effect of glucocorticoids and synthetic analogues, initial encounter: Secondary | ICD-10-CM | POA: Diagnosis present

## 2018-01-26 DIAGNOSIS — M545 Low back pain: Secondary | ICD-10-CM | POA: Diagnosis not present

## 2018-01-26 DIAGNOSIS — I255 Ischemic cardiomyopathy: Secondary | ICD-10-CM | POA: Diagnosis not present

## 2018-01-26 DIAGNOSIS — J189 Pneumonia, unspecified organism: Secondary | ICD-10-CM | POA: Diagnosis not present

## 2018-01-26 DIAGNOSIS — J9811 Atelectasis: Secondary | ICD-10-CM | POA: Diagnosis present

## 2018-01-26 DIAGNOSIS — Z7952 Long term (current) use of systemic steroids: Secondary | ICD-10-CM

## 2018-01-26 DIAGNOSIS — M25561 Pain in right knee: Secondary | ICD-10-CM | POA: Diagnosis not present

## 2018-01-26 DIAGNOSIS — I480 Paroxysmal atrial fibrillation: Secondary | ICD-10-CM | POA: Diagnosis not present

## 2018-01-26 DIAGNOSIS — N184 Chronic kidney disease, stage 4 (severe): Secondary | ICD-10-CM

## 2018-01-26 DIAGNOSIS — I350 Nonrheumatic aortic (valve) stenosis: Secondary | ICD-10-CM | POA: Diagnosis present

## 2018-01-26 DIAGNOSIS — I700 Atherosclerosis of aorta: Secondary | ICD-10-CM | POA: Diagnosis present

## 2018-01-26 DIAGNOSIS — N2889 Other specified disorders of kidney and ureter: Secondary | ICD-10-CM | POA: Diagnosis not present

## 2018-01-26 DIAGNOSIS — I130 Hypertensive heart and chronic kidney disease with heart failure and stage 1 through stage 4 chronic kidney disease, or unspecified chronic kidney disease: Secondary | ICD-10-CM | POA: Diagnosis present

## 2018-01-26 DIAGNOSIS — Z79899 Other long term (current) drug therapy: Secondary | ICD-10-CM

## 2018-01-26 DIAGNOSIS — D696 Thrombocytopenia, unspecified: Secondary | ICD-10-CM | POA: Diagnosis not present

## 2018-01-26 DIAGNOSIS — I214 Non-ST elevation (NSTEMI) myocardial infarction: Secondary | ICD-10-CM | POA: Diagnosis not present

## 2018-01-26 DIAGNOSIS — Z91040 Latex allergy status: Secondary | ICD-10-CM

## 2018-01-26 DIAGNOSIS — R52 Pain, unspecified: Secondary | ICD-10-CM | POA: Diagnosis not present

## 2018-01-26 DIAGNOSIS — Z90710 Acquired absence of both cervix and uterus: Secondary | ICD-10-CM

## 2018-01-26 DIAGNOSIS — M25461 Effusion, right knee: Secondary | ICD-10-CM | POA: Diagnosis not present

## 2018-01-26 DIAGNOSIS — M6281 Muscle weakness (generalized): Secondary | ICD-10-CM | POA: Diagnosis not present

## 2018-01-26 DIAGNOSIS — E162 Hypoglycemia, unspecified: Secondary | ICD-10-CM | POA: Diagnosis not present

## 2018-01-26 DIAGNOSIS — I5181 Takotsubo syndrome: Secondary | ICD-10-CM | POA: Diagnosis not present

## 2018-01-26 DIAGNOSIS — Z66 Do not resuscitate: Secondary | ICD-10-CM | POA: Diagnosis present

## 2018-01-26 DIAGNOSIS — I5042 Chronic combined systolic (congestive) and diastolic (congestive) heart failure: Secondary | ICD-10-CM | POA: Diagnosis present

## 2018-01-26 DIAGNOSIS — Z91048 Other nonmedicinal substance allergy status: Secondary | ICD-10-CM

## 2018-01-26 DIAGNOSIS — I519 Heart disease, unspecified: Secondary | ICD-10-CM | POA: Diagnosis not present

## 2018-01-26 DIAGNOSIS — Z9841 Cataract extraction status, right eye: Secondary | ICD-10-CM

## 2018-01-26 DIAGNOSIS — Z881 Allergy status to other antibiotic agents status: Secondary | ICD-10-CM

## 2018-01-26 DIAGNOSIS — M549 Dorsalgia, unspecified: Secondary | ICD-10-CM

## 2018-01-26 DIAGNOSIS — I248 Other forms of acute ischemic heart disease: Secondary | ICD-10-CM | POA: Diagnosis not present

## 2018-01-26 DIAGNOSIS — K5900 Constipation, unspecified: Secondary | ICD-10-CM | POA: Diagnosis not present

## 2018-01-26 DIAGNOSIS — R0602 Shortness of breath: Secondary | ICD-10-CM | POA: Diagnosis not present

## 2018-01-26 DIAGNOSIS — J8410 Pulmonary fibrosis, unspecified: Secondary | ICD-10-CM | POA: Diagnosis present

## 2018-01-26 DIAGNOSIS — K589 Irritable bowel syndrome without diarrhea: Secondary | ICD-10-CM | POA: Diagnosis present

## 2018-01-26 DIAGNOSIS — M2550 Pain in unspecified joint: Secondary | ICD-10-CM | POA: Diagnosis not present

## 2018-01-26 DIAGNOSIS — M4856XA Collapsed vertebra, not elsewhere classified, lumbar region, initial encounter for fracture: Secondary | ICD-10-CM | POA: Diagnosis present

## 2018-01-26 DIAGNOSIS — R278 Other lack of coordination: Secondary | ICD-10-CM | POA: Diagnosis not present

## 2018-01-26 DIAGNOSIS — R609 Edema, unspecified: Secondary | ICD-10-CM

## 2018-01-26 DIAGNOSIS — Z8349 Family history of other endocrine, nutritional and metabolic diseases: Secondary | ICD-10-CM

## 2018-01-26 DIAGNOSIS — J811 Chronic pulmonary edema: Secondary | ICD-10-CM

## 2018-01-26 DIAGNOSIS — J181 Lobar pneumonia, unspecified organism: Secondary | ICD-10-CM | POA: Diagnosis not present

## 2018-01-26 DIAGNOSIS — E785 Hyperlipidemia, unspecified: Secondary | ICD-10-CM | POA: Diagnosis present

## 2018-01-26 DIAGNOSIS — D631 Anemia in chronic kidney disease: Secondary | ICD-10-CM

## 2018-01-26 DIAGNOSIS — I2510 Atherosclerotic heart disease of native coronary artery without angina pectoris: Secondary | ICD-10-CM | POA: Diagnosis present

## 2018-01-26 DIAGNOSIS — Z7951 Long term (current) use of inhaled steroids: Secondary | ICD-10-CM

## 2018-01-26 DIAGNOSIS — I2729 Other secondary pulmonary hypertension: Secondary | ICD-10-CM | POA: Diagnosis present

## 2018-01-26 DIAGNOSIS — Z96651 Presence of right artificial knee joint: Secondary | ICD-10-CM | POA: Diagnosis present

## 2018-01-26 DIAGNOSIS — W19XXXA Unspecified fall, initial encounter: Secondary | ICD-10-CM | POA: Diagnosis not present

## 2018-01-26 DIAGNOSIS — R2681 Unsteadiness on feet: Secondary | ICD-10-CM | POA: Diagnosis not present

## 2018-01-26 DIAGNOSIS — Z885 Allergy status to narcotic agent status: Secondary | ICD-10-CM

## 2018-01-26 DIAGNOSIS — D869 Sarcoidosis, unspecified: Secondary | ICD-10-CM | POA: Diagnosis present

## 2018-01-26 DIAGNOSIS — N189 Chronic kidney disease, unspecified: Secondary | ICD-10-CM

## 2018-01-26 DIAGNOSIS — Z7401 Bed confinement status: Secondary | ICD-10-CM | POA: Diagnosis not present

## 2018-01-26 DIAGNOSIS — Z8719 Personal history of other diseases of the digestive system: Secondary | ICD-10-CM | POA: Diagnosis not present

## 2018-01-26 DIAGNOSIS — I2721 Secondary pulmonary arterial hypertension: Secondary | ICD-10-CM | POA: Diagnosis present

## 2018-01-26 DIAGNOSIS — Z4789 Encounter for other orthopedic aftercare: Secondary | ICD-10-CM | POA: Diagnosis not present

## 2018-01-26 DIAGNOSIS — M6389 Disorders of muscle in diseases classified elsewhere, multiple sites: Secondary | ICD-10-CM | POA: Diagnosis not present

## 2018-01-26 DIAGNOSIS — Z85828 Personal history of other malignant neoplasm of skin: Secondary | ICD-10-CM

## 2018-01-26 DIAGNOSIS — H409 Unspecified glaucoma: Secondary | ICD-10-CM | POA: Diagnosis not present

## 2018-01-26 DIAGNOSIS — Z9842 Cataract extraction status, left eye: Secondary | ICD-10-CM

## 2018-01-26 DIAGNOSIS — Z8249 Family history of ischemic heart disease and other diseases of the circulatory system: Secondary | ICD-10-CM

## 2018-01-26 DIAGNOSIS — S32010D Wedge compression fracture of first lumbar vertebra, subsequent encounter for fracture with routine healing: Secondary | ICD-10-CM | POA: Diagnosis not present

## 2018-01-26 LAB — COMPREHENSIVE METABOLIC PANEL
ALT: 38 U/L (ref 0–44)
AST: 39 U/L (ref 15–41)
Albumin: 2.2 g/dL — ABNORMAL LOW (ref 3.5–5.0)
Alkaline Phosphatase: 113 U/L (ref 38–126)
Anion gap: 10 (ref 5–15)
BILIRUBIN TOTAL: 1.1 mg/dL (ref 0.3–1.2)
BUN: 22 mg/dL (ref 8–23)
CALCIUM: 7.9 mg/dL — AB (ref 8.9–10.3)
CO2: 24 mmol/L (ref 22–32)
CREATININE: 1.79 mg/dL — AB (ref 0.44–1.00)
Chloride: 105 mmol/L (ref 98–111)
GFR, EST AFRICAN AMERICAN: 29 mL/min — AB (ref >60)
GFR, EST NON AFRICAN AMERICAN: 25 mL/min — AB (ref >60)
Glucose, Bld: 108 mg/dL — ABNORMAL HIGH (ref 70–99)
Potassium: 4.7 mmol/L (ref 3.5–5.1)
Sodium: 139 mmol/L (ref 135–145)
TOTAL PROTEIN: 4.7 g/dL — AB (ref 6.5–8.1)

## 2018-01-26 LAB — PROTIME-INR
INR: 1.07
PROTHROMBIN TIME: 13.8 s (ref 11.4–15.2)

## 2018-01-26 LAB — I-STAT TROPONIN, ED: Troponin i, poc: 0.64 ng/mL (ref 0.00–0.08)

## 2018-01-26 LAB — CBC WITH DIFFERENTIAL/PLATELET
ABS IMMATURE GRANULOCYTES: 0.2 10*3/uL — AB (ref 0.0–0.1)
Abs Immature Granulocytes: 0.1 10*3/uL (ref 0.0–0.1)
BASOS ABS: 0 10*3/uL (ref 0.0–0.1)
BASOS PCT: 0 %
Basophils Absolute: 0 10*3/uL (ref 0.0–0.1)
Basophils Relative: 0 %
EOS ABS: 0.1 10*3/uL (ref 0.0–0.7)
EOS ABS: 0 10*3/uL (ref 0.0–0.7)
EOS PCT: 0 %
Eosinophils Relative: 0 %
HEMATOCRIT: 54.9 % — AB (ref 36.0–46.0)
HEMATOCRIT: 48.5 % — AB (ref 36.0–46.0)
HEMOGLOBIN: 14.3 g/dL (ref 12.0–15.0)
Hemoglobin: 16.3 g/dL — ABNORMAL HIGH (ref 12.0–15.0)
IMMATURE GRANULOCYTES: 1 %
IMMATURE GRANULOCYTES: 1 %
LYMPHS ABS: 0.9 10*3/uL (ref 0.7–4.0)
LYMPHS ABS: 1.2 10*3/uL (ref 0.7–4.0)
LYMPHS PCT: 10 %
Lymphocytes Relative: 6 %
MCH: 29.3 pg (ref 26.0–34.0)
MCH: 29.4 pg (ref 26.0–34.0)
MCHC: 29.7 g/dL — ABNORMAL LOW (ref 30.0–36.0)
MCHC: 29.5 g/dL — AB (ref 30.0–36.0)
MCV: 98.6 fL (ref 78.0–100.0)
MCV: 99.8 fL (ref 78.0–100.0)
MONOS PCT: 6 %
Monocytes Absolute: 0.8 10*3/uL (ref 0.1–1.0)
Monocytes Absolute: 0.7 10*3/uL (ref 0.1–1.0)
Monocytes Relative: 6 %
NEUTROS ABS: 11.8 10*3/uL — AB (ref 1.7–7.7)
NEUTROS PCT: 83 %
Neutro Abs: 9.8 10*3/uL — ABNORMAL HIGH (ref 1.7–7.7)
Neutrophils Relative %: 87 %
PLATELETS: 170 10*3/uL (ref 150–400)
Platelets: 147 10*3/uL — ABNORMAL LOW (ref 150–400)
RBC: 5.57 MIL/uL — ABNORMAL HIGH (ref 3.87–5.11)
RBC: 4.86 MIL/uL (ref 3.87–5.11)
RDW: 16.6 % — ABNORMAL HIGH (ref 11.5–15.5)
RDW: 16.6 % — AB (ref 11.5–15.5)
WBC: 13.7 10*3/uL — ABNORMAL HIGH (ref 4.0–10.5)
WBC: 11.9 10*3/uL — ABNORMAL HIGH (ref 4.0–10.5)

## 2018-01-26 LAB — BASIC METABOLIC PANEL
ANION GAP: 12 (ref 5–15)
BUN: 24 mg/dL — ABNORMAL HIGH (ref 8–23)
CO2: 27 mmol/L (ref 22–32)
Calcium: 8.9 mg/dL (ref 8.9–10.3)
Chloride: 102 mmol/L (ref 98–111)
Creatinine, Ser: 1.93 mg/dL — ABNORMAL HIGH (ref 0.44–1.00)
GFR calc Af Amer: 26 mL/min — ABNORMAL LOW (ref >60)
GFR, EST NON AFRICAN AMERICAN: 23 mL/min — AB (ref >60)
GLUCOSE: 161 mg/dL — AB (ref 70–99)
POTASSIUM: 4.4 mmol/L (ref 3.5–5.1)
Sodium: 141 mmol/L (ref 135–145)

## 2018-01-26 LAB — I-STAT CG4 LACTIC ACID, ED
LACTIC ACID, VENOUS: 2.5 mmol/L — AB (ref 0.5–1.9)
Lactic Acid, Venous: 1.03 mmol/L (ref 0.5–1.9)

## 2018-01-26 LAB — D-DIMER, QUANTITATIVE (NOT AT ARMC): D DIMER QUANT: 13.53 ug{FEU}/mL — AB (ref 0.00–0.50)

## 2018-01-26 LAB — TROPONIN I
TROPONIN I: 0.91 ng/mL — AB (ref <0.03)
Troponin I: 0.88 ng/mL (ref <0.03)

## 2018-01-26 LAB — MRSA PCR SCREENING: MRSA by PCR: NEGATIVE

## 2018-01-26 LAB — LACTIC ACID, PLASMA
LACTIC ACID, VENOUS: 1.1 mmol/L (ref 0.5–1.9)
LACTIC ACID, VENOUS: 1 mmol/L (ref 0.5–1.9)

## 2018-01-26 LAB — APTT: aPTT: 28 seconds (ref 24–36)

## 2018-01-26 LAB — CBG MONITORING, ED: Glucose-Capillary: 166 mg/dL — ABNORMAL HIGH (ref 70–99)

## 2018-01-26 LAB — BRAIN NATRIURETIC PEPTIDE: B NATRIURETIC PEPTIDE 5: 384.8 pg/mL — AB (ref 0.0–100.0)

## 2018-01-26 MED ORDER — CEFDINIR 300 MG PO CAPS
300.0000 mg | ORAL_CAPSULE | Freq: Every day | ORAL | Status: DC
  Administered 2018-01-26 – 2018-01-28 (×3): 300 mg via ORAL
  Filled 2018-01-26 (×3): qty 1

## 2018-01-26 MED ORDER — LEVOFLOXACIN IN D5W 750 MG/150ML IV SOLN
750.0000 mg | INTRAVENOUS | Status: AC
  Administered 2018-01-26: 750 mg via INTRAVENOUS
  Filled 2018-01-26: qty 150

## 2018-01-26 MED ORDER — ATORVASTATIN CALCIUM 10 MG PO TABS
10.0000 mg | ORAL_TABLET | Freq: Every day | ORAL | Status: DC
  Administered 2018-01-26 – 2018-02-03 (×9): 10 mg via ORAL
  Filled 2018-01-26 (×9): qty 1

## 2018-01-26 MED ORDER — ACETAMINOPHEN 500 MG PO TABS
1000.0000 mg | ORAL_TABLET | Freq: Once | ORAL | Status: AC
  Administered 2018-01-26: 1000 mg via ORAL
  Filled 2018-01-26: qty 2

## 2018-01-26 MED ORDER — TECHNETIUM TO 99M ALBUMIN AGGREGATED
4.3000 | Freq: Once | INTRAVENOUS | Status: AC | PRN
  Administered 2018-01-26: 4.3 via INTRAVENOUS

## 2018-01-26 MED ORDER — SODIUM CHLORIDE 0.9 % IV SOLN
INTRAVENOUS | Status: AC
  Administered 2018-01-26: 08:00:00 via INTRAVENOUS

## 2018-01-26 MED ORDER — ACETAMINOPHEN 650 MG RE SUPP: 650.0000 mg | Freq: Four times a day (QID) | RECTAL | Status: DC | PRN

## 2018-01-26 MED ORDER — HEPARIN SODIUM (PORCINE) 5000 UNIT/ML IJ SOLN
5000.0000 [IU] | Freq: Three times a day (TID) | INTRAMUSCULAR | Status: DC
  Administered 2018-01-26 – 2018-01-27 (×3): 5000 [IU] via SUBCUTANEOUS
  Filled 2018-01-26 (×2): qty 1

## 2018-01-26 MED ORDER — TECHNETIUM TC 99M DIETHYLENETRIAME-PENTAACETIC ACID
32.0000 | Freq: Once | INTRAVENOUS | Status: AC | PRN
  Administered 2018-01-26: 32 via RESPIRATORY_TRACT

## 2018-01-26 MED ORDER — SODIUM CHLORIDE 0.9 % IV BOLUS (SEPSIS)
1000.0000 mL | Freq: Once | INTRAVENOUS | Status: AC
  Administered 2018-01-26: 1000 mL via INTRAVENOUS

## 2018-01-26 MED ORDER — LEVOFLOXACIN IN D5W 500 MG/100ML IV SOLN: 500.0000 mg | INTRAVENOUS | Status: DC

## 2018-01-26 MED ORDER — ASPIRIN 325 MG PO TABS
325.0000 mg | ORAL_TABLET | Freq: Every day | ORAL | Status: DC
  Administered 2018-01-26 – 2018-01-30 (×5): 325 mg via ORAL
  Filled 2018-01-26 (×5): qty 1

## 2018-01-26 MED ORDER — ONDANSETRON HCL 4 MG/2ML IJ SOLN: 4.0000 mg | Freq: Four times a day (QID) | INTRAMUSCULAR | Status: DC | PRN

## 2018-01-26 MED ORDER — LATANOPROST 0.005 % OP SOLN
1.0000 [drp] | Freq: Every day | OPHTHALMIC | Status: DC
  Administered 2018-01-26 – 2018-02-02 (×7): 1 [drp] via OPHTHALMIC
  Filled 2018-01-26 (×2): qty 2.5

## 2018-01-26 MED ORDER — VANCOMYCIN HCL IN DEXTROSE 1-5 GM/200ML-% IV SOLN
1000.0000 mg | Freq: Once | INTRAVENOUS | Status: AC
  Administered 2018-01-26: 1000 mg via INTRAVENOUS
  Filled 2018-01-26: qty 200

## 2018-01-26 MED ORDER — SODIUM CHLORIDE 0.9 % IV SOLN
2.0000 g | Freq: Once | INTRAVENOUS | Status: AC
  Administered 2018-01-26: 2 g via INTRAVENOUS
  Filled 2018-01-26: qty 2

## 2018-01-26 MED ORDER — METRONIDAZOLE IN NACL 5-0.79 MG/ML-% IV SOLN
500.0000 mg | Freq: Three times a day (TID) | INTRAVENOUS | Status: DC
  Administered 2018-01-26: 500 mg via INTRAVENOUS
  Filled 2018-01-26: qty 100

## 2018-01-26 MED ORDER — TRAMADOL HCL 50 MG PO TABS
100.0000 mg | ORAL_TABLET | Freq: Three times a day (TID) | ORAL | Status: DC
  Administered 2018-01-26: 100 mg via ORAL
  Filled 2018-01-26: qty 2

## 2018-01-26 MED ORDER — MIRTAZAPINE 7.5 MG PO TABS
15.0000 mg | ORAL_TABLET | Freq: Every day | ORAL | Status: DC
  Administered 2018-01-26 – 2018-02-02 (×8): 15 mg via ORAL
  Filled 2018-01-26 (×8): qty 2

## 2018-01-26 MED ORDER — HYDROCORTISONE NA SUCCINATE PF 100 MG IJ SOLR
50.0000 mg | Freq: Three times a day (TID) | INTRAMUSCULAR | Status: DC
  Administered 2018-01-26 – 2018-01-28 (×8): 50 mg via INTRAVENOUS
  Filled 2018-01-26 (×7): qty 2

## 2018-01-26 MED ORDER — SODIUM CHLORIDE 0.9 % IV BOLUS (SEPSIS)
500.0000 mL | Freq: Once | INTRAVENOUS | Status: AC
  Administered 2018-01-26: 500 mL via INTRAVENOUS

## 2018-01-26 MED ORDER — ONDANSETRON HCL 4 MG PO TABS: 4.0000 mg | ORAL_TABLET | Freq: Four times a day (QID) | ORAL | Status: DC | PRN

## 2018-01-26 MED ORDER — AMIODARONE HCL 200 MG PO TABS
200.0000 mg | ORAL_TABLET | Freq: Every day | ORAL | Status: DC
  Administered 2018-01-26 – 2018-02-03 (×9): 200 mg via ORAL
  Filled 2018-01-26 (×9): qty 1

## 2018-01-26 MED ORDER — ACETAMINOPHEN 325 MG PO TABS: 650.0000 mg | ORAL_TABLET | Freq: Four times a day (QID) | ORAL | Status: DC | PRN

## 2018-01-26 NOTE — ED Notes (Signed)
Pt transported for VQ scan. 

## 2018-01-26 NOTE — Progress Notes (Addendum)
PROGRESS NOTE    JOYICE MAGDA  HFW:[id number] DOB: 09-22-1931 DOA: 01/26/2018 PCP: [name], MD  Outpatient Specialists:   Brief Narrative:  Patient is an 84 year old Caucasian female, with multiple medical and cardiac problems.  Patient carries diagnoses of paroxysmal atrial fibrillation on chronic anticoagulation (Eliquis) but currently on hold as kyphoplasty is planned, chronic kidney disease stage IV, chronic steroid therapy likely for sarcoidosis (though the patient's family tells me chronic steroid therapy is for chronic cough), diastolic CHF (based on echocardiogram done in August 2017, with EF of 55 to 60%), sarcoidosis and interventricular conduction delay as per prior EKG.  Patient was admitted with weakness and lightheadedness.  Patient has had decreased p.o. intake.  On presentation, patient was hypotensive, with elevated lactic acid level that has now resolved.  Temperature 100.5 F was documented.  Patient is on chronic amiodarone therapy and has had fibrosis noted on previous imaging studies of the lung.  On presentation to the hospital, sepsis likely secondary to pneumonia was entertained.  However, CT chest without contrast was not indicative of pneumonic process.  D-dimer was significantly elevated, but VQ scan is not indicative of pulmonary embolism.  Troponin came back at 0.9.  However, patient denies chest pain or shortness of breath.  Patient continues to appear weak.  Cardiology team has been consulted.  Patient is currently on stress dose steroid.    Assessment & Plan:   Principal Problem:   Sepsis ([place]) Active Problems:   PAF (paroxysmal atrial fibrillation) (HCC)   CKD (chronic kidney disease), stage IV (HCC)   Essential hypertension   Anemia in chronic renal disease  Sepsis likely from pneumonia: CT chest is not indicative of pneumonia. Discontinue Levaquin. Check procalcitonin level (note that this may be elevated due to other factors) Repeat lactic acid  is back to normal.  Cautious hydration for now.  Continue stress dose steroids.  Continue to monitor and assess patient closely.  Further management will depend on further course.  No sepsis physiology at the moment.   Hypotension on presentation may be secondary to volume depletion/ineffective circulatory arterial volume. Patient was also on Lasix prior to presentation. Follow result of echocardiogram.  Elevated troponin: Likely type II elevation. However, the patient is at risk for acute coronary syndrome (advanced chronic kidney disease etc.) Aspirin Trend troponin EKG done earlier revealed intraventricular conduction delay, with no new changes No chest pain or shortness of breath reported.  However, patient was weak on presentation, but may be due to volume depletion Cardiology consult Echocardiogram Will defer the decision to start patient on heparin drip to the cardiology team. Further management will depend on hospital course  Recurrent thoracolumbar compression fracture, likely secondary to severe osteoporosis and chronic steroid use: Kyphoplasty was planned prior to presentation. Patient has had repeated kyphoplasty in the past. Optimize pain control (back pain) Likely kyphoplasty once patient is optimized.  History of sarcoidosis on chronic steroid therapy: Patient will need to follow-up with pulmonary team on discharge to address need to continue chronic steroid therapy. Patient has likely severe osteoporosis with a recurrent compression fracture of the thoracolumbar spine. Continue stress dose steroids for now.  Weakness, lightheadedness, hypotension and likely ineffective circulatory arterial volume: Hypotension has resolved. We will be careful with IV fluids. Chest x-ray reviewed (likely of fluid fissure, encephalization and elevated right hemidiaphragm) Low threshold to discontinue IV fluids Low threshold to consider IV albumin if blood pressure continues to drop  or remains low normal. Monitor closely. Lightheadedness has resolved.  Chronic leukocytosis: This is likely secondary to chronic steroid use. T-max 100.5 F, but no further fever documented. No constitutional symptoms of pneumonia. Discontinue Levaquin, especially, and patient states. Omnicef for possible bronchitis, but will have low threshold to discontinue antibiotics entirely.  Elevated d-dimer: VQ scan was not indicative of pulmonary embolism.  Chronic kidney disease stage IV: This is at baseline. Continue to monitor.  Chronic diastolic congestive heart failure: Currently compensated. Lasix is on hold. Manage expectantly.  Chronic paroxysmal atrial fibrillation: Heparin for now Patient has followed up by cardiology. Monitor platelet level (147 today)  DVT prophylaxis: Heparin infusion. Code Status: DNR. Family Communication: Patient husband and son. Disposition Plan:  Will depend on hospital course Home.  Consultants:   Cardiology  Procedures:   Kyphoplasty is planned when patient is stable  Echo is pending  Antimicrobials:   Levaquin discontinued  Omnicef   Subjective: No fever no chills No cough No phlegm production No chest pain Patient remains weakwith  Objective: Vitals:   01/26/18 1015 01/26/18 1045 01/26/18 1048 01/26/18 1201  BP: 117/75 110/73  119/78  Pulse: 72 69 69   Resp: (!) 21 16 15 20   Temp:    98.1 F (36.7 C)  TempSrc:    Oral  SpO2: 99% 99% 99% 97%  Weight:    75.5 kg  Height:    5\' 4"  (1.626 m)    Intake/Output Summary (Last 24 hours) at 01/26/2018 1537 Last data filed at 01/26/2018 0925 Gross per 24 hour  Intake 1483 ml  Output -  Net 1483 ml   Filed Weights   01/26/18 1201  Weight: 75.5 kg    Examination:  General exam: Weak looking.  Cushingoid facies.   Respiratory system: Clear to auscultation. Respiratory effort normal. Cardiovascular system: S1 & S2.   Gastrointestinal system: Abdomen is obese, soft  and nontender.  Organs are difficult to assess.   Central nervous system: Awake and alert.  Patient moves all limbs.    Data Reviewed: I have personally reviewed following labs and imaging studies  CBC: Recent Labs  Lab 01/26/18 0307 01/26/18 1016  WBC 13.7* 11.9*  NEUTROABS 11.8* 9.8*  HGB 16.3* 14.3  HCT 54.9* 48.5*  MCV 98.6 99.8  PLT 170 540*   Basic Metabolic Panel: Recent Labs  Lab 01/26/18 0307 01/26/18 1303  NA 141 139  K 4.4 4.7  CL 102 105  CO2 27 24  GLUCOSE 161* 108*  BUN 24* 22  CREATININE 1.93* 1.79*  CALCIUM 8.9 7.9*   GFR: Estimated Creatinine Clearance: 23.3 mL/min (A) (by C-G formula based on SCr of 1.79 mg/dL (H)). Liver Function Tests: Recent Labs  Lab 01/26/18 1303  AST 39  ALT 38  ALKPHOS 113  BILITOT 1.1  PROT 4.7*  ALBUMIN 2.2*   No results for input(s): LIPASE, AMYLASE in the last 168 hours. No results for input(s): AMMONIA in the last 168 hours. Coagulation Profile: Recent Labs  Lab 01/26/18 1016  INR 1.07   Cardiac Enzymes: Recent Labs  Lab 01/26/18 1303  TROPONINI 0.91*   BNP (last 3 results) No results for input(s): PROBNP in the last 8760 hours. HbA1C: No results for input(s): HGBA1C in the last 72 hours. CBG: Recent Labs  Lab 01/26/18 0316  GLUCAP 166*   Lipid Profile: No results for input(s): CHOL, HDL, LDLCALC, TRIG, CHOLHDL, LDLDIRECT in the last 72 hours. Thyroid Function Tests: No results for input(s): TSH, T4TOTAL, FREET4, T3FREE, THYROIDAB in the last 72 hours. Anemia Panel: No  results for input(s): VITAMINB12, FOLATE, FERRITIN, TIBC, IRON, RETICCTPCT in the last 72 hours. Urine analysis:    Component Value Date/Time   COLORURINE YELLOW 10/16/2017 2117   APPEARANCEUR CLEAR 10/16/2017 2117   APPEARANCEUR Clear 07/17/2017 1354   LABSPEC 1.011 10/16/2017 2117   PHURINE 5.0 10/16/2017 2117   GLUCOSEU NEGATIVE 10/16/2017 2117   HGBUR MODERATE (A) 10/16/2017 2117   BILIRUBINUR NEGATIVE 10/16/2017 2117     BILIRUBINUR Negative 07/17/2017 1354   KETONESUR NEGATIVE 10/16/2017 2117   PROTEINUR NEGATIVE 10/16/2017 2117   UROBILINOGEN 0.2 02/05/2011 0915   NITRITE NEGATIVE 10/16/2017 2117   LEUKOCYTESUR NEGATIVE 10/16/2017 2117   LEUKOCYTESUR Negative 07/17/2017 1354   Sepsis Labs: @LABRCNTIP (procalcitonin:4,lacticidven:4)  )No results found for this or any previous visit (from the past 240 hour(s)).       Radiology Studies: Ct Head Wo Contrast  Result Date: 01/26/2018 CLINICAL DATA:  Dizziness with standing. LEFT-sided weakness. History of atrial fibrillation, hypercholesterolemia, sarcoidosis. EXAM: CT HEAD WITHOUT CONTRAST TECHNIQUE: Contiguous axial images were obtained from the base of the skull through the vertex without intravenous contrast. COMPARISON:  MRI head November 23, 2015 FINDINGS: BRAIN: No intraparenchymal hemorrhage, mass effect nor midline shift. Moderate to severe parenchymal brain volume loss. No hydrocephalus. Old small LEFT cerebellar infarct. Patchy supratentorial white matter. No acute large vascular territory infarcts. No abnormal extra-axial fluid collections. Basal cisterns are patent. 6 mm pineal cyst. VASCULAR: Moderate calcific atherosclerosis of the carotid siphons. SKULL: No skull fracture. Moderate RIGHT temporomandibular osteoarthrosis. Osteopenia. No significant scalp soft tissue swelling. SINUSES/ORBITS: Mild paranasal sinus mucosal thickening. RIGHT frontal sinus osteoma. Mastoid air cells are well aerated.The included ocular globes and orbital contents are non-suspicious. Status post bilateral ocular lens implants. OTHER: None. IMPRESSION: 1. No acute intracranial process. 2. Stable moderate to severe parenchymal brain volume loss. 3. Progressed moderate chronic small vessel ischemic changes. Electronically Signed   By: Elon Alas M.D.   On: 01/26/2018 03:40   Ct Chest Wo Contrast  Result Date: 01/26/2018 CLINICAL DATA:  Fever and hypoxia. Pneumonia,  unresolved for complicated EXAM: CT CHEST WITHOUT CONTRAST TECHNIQUE: Multidetector CT imaging of the chest was performed following the standard protocol without IV contrast. COMPARISON:  12/03/2013 FINDINGS: Cardiovascular: No cardiomegaly or pericardial effusion. There is mitral and aortic annular calcification. Degree of aortic valve calcification. Atherosclerosis of the aorta and coronaries. Mediastinum/Nodes: Negative for adenopathy or mass. Lungs/Pleura: Streaky opacity with volume loss in the right perihilar region and right lower lobe, consistent with scarring. This was also seen previously, with accentuation likely from lower lung volumes today. There is a degree of subpleural reticulation also on the left, nonspecific. No honeycombing. No air bronchogram, edema, effusion, or masslike finding Upper Abdomen: No acute finding Musculoskeletal: No acute or aggressive finding. IMPRESSION: 1. Right perihilar atelectasis/scarring. No consolidating pneumonia. 2. Low lung volumes with nonspecific subpleural reticulation. Electronically Signed   By: Monte Fantasia M.D.   On: 01/26/2018 11:30   Nm Pulmonary Vent And Perf (v/q Scan)  Result Date: 01/26/2018 CLINICAL DATA:  Shortness of breath. EXAM: NUCLEAR MEDICINE VENTILATION - PERFUSION LUNG SCAN TECHNIQUE: Ventilation images were obtained in multiple projections using inhaled aerosol Tc-46m DTPA. Perfusion images were obtained in multiple projections after intravenous injection of Tc-4m-MAA. RADIOPHARMACEUTICALS:  Thirty-two mCi of Tc-42m DTPA aerosol inhalation and 4.3 mCi Tc60m-MAA IV COMPARISON:  Chest x-rays dated 01/26/2018 and 10/15/2017 FINDINGS: The patient has chronic elevation of the right hemidiaphragm. The patient swallowed moderate amount of the DTPA. Ventilation: No focal ventilation  defect. Perfusion: No wedge shaped peripheral perfusion defects to suggest acute pulmonary embolism. IMPRESSION: Normal ventilation perfusion defect. Electronically  Signed   By: Lorriane Shire M.D.   On: 01/26/2018 09:15   Dg Chest Portable 1 View  Result Date: 01/26/2018 CLINICAL DATA:  Fever, hypoxia tonight. EXAM: PORTABLE CHEST 1 VIEW COMPARISON:  Chest radiograph October 15, 2017 and December 26, 2015 FINDINGS: Increasing elevated RIGHT hemidiaphragm. RIGHT mid lung zone bandlike density overlapping fissure. Mild bronchitic changes. Patchy airspace opacity silhouetting RIGHT pulmonary hilum. Cardiac silhouette is normal size. Calcified aortic arch. Biapical pleural thickening. No pneumothorax. Osteopenia. IMPRESSION: Bronchitic changes with RIGHT atelectasis. Potential RIGHT perihilar pneumonia. Aortic Atherosclerosis (ICD10-I70.0). Electronically Signed   By: Elon Alas M.D.   On: 01/26/2018 03:48        Scheduled Meds: . amiodarone  200 mg Oral Daily  . aspirin  325 mg Oral Daily  . atorvastatin  10 mg Oral Daily  . cefdinir  300 mg Oral Daily  . heparin  5,000 Units Subcutaneous Q8H  . hydrocortisone sod succinate (SOLU-CORTEF) inj  50 mg Intravenous Q8H  . latanoprost  1 drop Both Eyes QHS  . mirtazapine  15 mg Oral QHS   Continuous Infusions: . sodium chloride 100 mL/hr at 01/26/18 0733     LOS: 0 days    Time spent: 35 minutes.    Dana Allan, MD  [facility] Pager #: [phone] 7PM-7AM contact night coverage as above

## 2018-01-26 NOTE — ED Notes (Signed)
Pt currently in CT. RN to transport to floor upon returning from scan.

## 2018-01-26 NOTE — ED Provider Notes (Signed)
TIME SEEN: 2:31 AM  CHIEF COMPLAINT: Lightheadedness  HPI: Patient is an 84 year old female with history of atrial fibrillation on amiodarone and Eliquis, CHF, mild aortic stenosis who presents to the emergency department with an episode of lightheadedness.  States she went to sleep last night was feeling fine.  Got up to go to the bathroom and felt very lightheaded like she might pass out and sent down to the ground.  Did not hit her head or lose consciousness.  She states she feels very weak.  Found to be hypotensive and hypoxic with EMS.  Does not wear oxygen chronically.  She denies having chest pain or shortness of breath.  No recent vomiting, diarrhea, bloody stools, melena, fever.  She is scheduled to have kyphoplasty today for compression fracture.  Has been taking tramadol for pain but states she has been taking this for a long period of time without any significant side effects.  No change in her dose and she did not take any extra medication today.  ROS: See HPI Constitutional: no fever  Eyes: no drainage  ENT: no runny nose   Cardiovascular:  no chest pain  Resp: no SOB  GI: no vomiting GU: no dysuria Integumentary: no rash  Allergy: no hives  Musculoskeletal: no leg swelling  Neurological: no slurred speech ROS otherwise negative  PAST MEDICAL HISTORY/PAST SURGICAL HISTORY:  Past Medical History:  Diagnosis Date  . Aortic atherosclerosis ([place]) 12/25/2016  . Aortic stenosis, mild 12/14/2015  . Arthritis 08-28-11   right knee pain-surgery planned  . Cancer [facility]) 08-28-11   Melonoma-cheek(many Yrs ago) , recent bx. left  face lesion, past skin cancer lesions  . CAP (community acquired pneumonia) 12/13/2015  . CHF (congestive heart failure) ([place])   . Complication of anesthesia 08-28-11   in past arrythmia-  cardiology has seen in past  . Dysrhythmia 08-28-11   only immed.  to several days after surgery- hx. A.fib. in past  . GERD (gastroesophageal reflux disease) 08-28-11   tx.  omeprazole  . Hypercholesterolemia   . Hypertension 08-28-11   tx. meds  . IBS (irritable bowel syndrome)   . Lymphedema   . Neuromuscular disorder ([place]) 08-28-11   hx. Polio age 84-slight residual affects legs  . Osteoarthritis    back and left knee  . Osteoporosis   . Pneumonia 12/2015   hospital x 3 days  . PONV (postoperative nausea and vomiting)   . Renal cyst, right   . Sarcoidosis   . Swelling of extremity 08-28-11   bilateral lower extremities- mid calf down    MEDICATIONS:  Prior to Admission medications   Medication Sig Start Date End Date Taking? Authorizing Provider  acetaminophen (TYLENOL) 500 MG tablet Take 1,000 mg by mouth every 8 (eight) hours as needed (for pain).     [provider]  amiodarone (PACERONE) 200 MG tablet Take 1 tablet (200 mg total) by mouth daily. 07/17/17   Jettie Booze, MD  atorvastatin (LIPITOR) 10 MG tablet Take 1 tablet (10 mg total) by mouth daily. 09/08/17   Jettie Booze, MD  beclomethasone (QVAR) 80 MCG/ACT inhaler Inhale 2 puffs into the lungs 2 (two) times daily as needed (for flares).     [provider]  bisacodyl (DULCOLAX) 10 MG suppository Place 1 suppository (10 mg total) rectally daily as needed for moderate constipation. 10/23/17   Samuella Cota, MD  ELIQUIS 2.5 MG TABS tablet TAKE 1 TABLET BY MOUTH TWICE A DAY 12/22/17  Jettie Booze, MD  fexofenadine (ALLEGRA) 180 MG tablet Take 180 mg by mouth daily as needed for allergies.     [provider]  fluticasone (FLONASE) 50 MCG/ACT nasal spray Place 1 spray into both nostrils daily as needed for allergies.  09/27/13   [provider]  furosemide (LASIX) 20 MG tablet Take one tablet (20mg  total) by mouth daily, alternating with 2 tablets by mouth daily (40mg  total). 11/24/17   Isaiah Serge, NP  gabapentin (NEURONTIN) 100 MG capsule Take 1 capsule (100 mg total) by mouth at bedtime. 10/23/17   Samuella Cota, MD  guaiFENesin  (MUCINEX) 600 MG 12 hr tablet Take 600 mg by mouth 2 (two) times daily as needed for cough or to loosen phlegm.     [provider]  latanoprost (XALATAN) 0.005 % ophthalmic solution Place 1 drop into both eyes at bedtime. 08/11/14   [provider]  meclizine (ANTIVERT) 25 MG tablet Take 12.5 mg by mouth 3 (three) times daily as needed for dizziness.  04/01/14   [provider]  oxyCODONE (OXY IR/ROXICODONE) 5 MG immediate release tablet Take 1 tablet (5 mg total) by mouth every 6 (six) hours as needed for severe pain. 10/23/17   Samuella Cota, MD  polyethylene glycol [facility] / Floria Raveling) packet Take 17 g by mouth 2 (two) times daily. 10/23/17   Samuella Cota, MD  potassium chloride (K-DUR) 10 MEQ tablet Take one tablet by mouth daily on the days that you take Furosemide 40mg . 11/24/17   Isaiah Serge, NP  predniSONE (DELTASONE) 10 MG tablet Take 10 mg by mouth daily with breakfast.    [provider]  Respiratory Therapy Supplies (FLUTTER) DEVI Use as directed. 02/06/16   Mannam, Hart Robinsons, MD  senna (SENOKOT) 8.6 MG TABS tablet Take 1 tablet (8.6 mg total) by mouth at bedtime. 10/23/17   Samuella Cota, MD    ALLERGIES:  Allergies  Allergen Reactions  . Augmentin [Amoxicillin-Pot Clavulanate] Diarrhea and Nausea Only  . Boniva [Ibandronic Acid] Diarrhea  . Codeine Other (See Comments)    "Spaced out feeling" and Lightheadedness  . Hydromorphone Nausea And Vomiting  . Latex Rash    "Burns me"  . Lisinopril Cough  . Adhesive [Tape] Itching and Rash    SOCIAL HISTORY:  Social History   Tobacco Use  . Smoking status: Never Smoker  . Smokeless tobacco: Never Used  Substance Use Topics  . Alcohol use: No    Alcohol/week: 0.0 standard drinks    FAMILY HISTORY: Family History  Problem Relation Age of Onset  . Heart disease Mother        Varicose Veins  . Hyperlipidemia Mother   . Hypertension Mother   . Colon cancer Mother   . Heart  disease Father        Heart Disease before age 47  . Hypertension Father   . Emphysema Father   . Breast cancer Sister   . Stroke Maternal Grandmother   . Heart attack Brother     EXAM: BP (!) 150/87   Pulse 94   Resp 17   SpO2 (!) 68%  CONSTITUTIONAL: Alert and oriented and responds appropriately to questions.  Elderly HEAD: Normocephalic EYES: Conjunctivae clear, pupils appear equal, EOMI ENT: normal nose; moist mucous membranes NECK: Supple, no meningismus, no nuchal rigidity, no LAD  CARD: RRR; S1 and S2 appreciated; no murmurs, no clicks, no rubs, no gallops RESP: Normal chest excursion without splinting or tachypnea;  breath sounds clear and equal bilaterally; no wheezes, no rhonchi, no rales, she is hypoxic on room air, no respiratory distress, speaking full sentences ABD/GI: Normal bowel sounds; non-distended; soft, non-tender, no rebound, no guarding, no peritoneal signs, no hepatosplenomegaly BACK:  The back appears normal and is non-tender to palpation, there is no CVA tenderness EXT: Normal ROM in all joints; non-tender to palpation; no edema; normal capillary refill; no cyanosis, no calf tenderness or swelling    SKIN: Normal color for age and race; warm; no rash NEURO: Moves all extremities equally has difficult time lifting her legs off the bed, seems to be weaker in the left arm than the right, reports normal sensation diffusely, cranial nerves II through XII intact, normal speech PSYCH: The patient's mood and manner are appropriate. Grooming and personal hygiene are appropriate.  MEDICAL DECISION MAKING: Patient here with hypotension, hypoxia, lightheadedness, generalized weakness.  Differential is large including infection, dehydration, ACS, PE, pneumothorax.  She does appear to have some left-sided weakness on exam and it is unclear if this is something that is old for her.  Patient and family are very poor historians.  Will obtain CT of her head.  Will obtain labs,  urine.  Will give gentle IV fluids given history of CHF and aortic stenosis.  ED PROGRESS: Patient found to have rectal temperature of 100.5.  Will start septic work-up with broad-spectrum antibiotics, 30 mL/kg IV fluid bolus and admission to the hospital.  Cultures, lactate, urine, chest x-ray pending.  Work-up concerning for pneumonia.  She has elevated troponin likely from demand ischemia.  EKG shows no ischemic abnormality.  Lactate is elevated.  She has a leukocytosis with left shift.  She does have an elevated d-dimer but unfortunately cannot have a CTA at this time due to her kidney function.  We will order a VQ scan to be done in the morning.  She is receiving IV fluids and broad-spectrum antibiotics.  Vital signs are improving.  4:32 AM Discussed patient's case with hospitalist, Dr. Hal Hope.  I have recommended admission and patient (and family if present) agree with this plan. Admitting physician will place admission orders.   I reviewed all nursing notes, vitals, pertinent previous records, EKGs, lab and urine results, imaging (as available).    EKG Interpretation  Date/Time:  Monday January 26 2018 03:42:58 EDT Ventricular Rate:  88 PR Interval:    QRS Duration: 137 QT Interval:  405 QTC Calculation: 490 R Axis:   -57 Text Interpretation:  Sinus rhythm Left bundle branch block AVNRT resolved compared to previous EKG Confirmed by Meiling Hendriks, Cyril Mourning [phone]) on 01/26/2018 3:59:06 AM        CRITICAL CARE Performed by: Cyril Mourning Kayleanna Lorman   Total critical care time: 45 minutes  Critical care time was exclusive of separately billable procedures and treating other patients.  Critical care was necessary to treat or prevent imminent or life-threatening deterioration.  Critical care was time spent personally by me on the following activities: development of treatment plan with patient and/or surrogate as well as nursing, discussions with consultants, evaluation of patient's response to  treatment, examination of patient, obtaining history from patient or surrogate, ordering and performing treatments and interventions, ordering and review of laboratory studies, ordering and review of radiographic studies, pulse oximetry and re-evaluation of patient's condition.     Patrice Moates, Delice Bison, DO 01/26/18 [phone]

## 2018-01-26 NOTE — ED Notes (Signed)
Performed bladder scan reading 422 ml. Pt tolerated well.

## 2018-01-26 NOTE — Progress Notes (Signed)
PHARMACY NOTE:  ANTIMICROBIAL RENAL DOSAGE ADJUSTMENT  Current antimicrobial regimen includes a mismatch between antimicrobial dosage and estimated renal function.  As per policy approved by the Pharmacy & Therapeutics and Medical Executive Committees, the antimicrobial dosage will be adjusted accordingly.  Current antimicrobial dosage: Cefdinir 300mg  BID  Indication: PNA  Renal Function:  Estimated Creatinine Clearance: 21.6 mL/min (A) (by C-G formula based on SCr of 1.93 mg/dL (H)). []      On intermittent HD, scheduled: []      On CRRT    Antimicrobial dosage has been changed to:  Cefdinir 300mg  Daily  Additional comments:   Dugan Vanhoesen A. Levada Dy, PharmD, [place] Pager: [phone] Please utilize Amion for appropriate phone number to reach the unit pharmacist ([place])    01/26/2018 12:28 PM

## 2018-01-26 NOTE — ED Notes (Signed)
Lab reported light green tube hemolyzed. Must redraw. New orders placed.

## 2018-01-26 NOTE — H&P (Addendum)
History and Physical    Heather Benson EXH:[id number] DOB: Dec 13, 1931 DOA: 01/26/2018  PCP: [name], MD  Patient coming from: Home.  Chief Complaint: Lightheadedness.  HPI: Heather Benson is a 84 y.o. female with history of paroxysmal atrial fibrillation, chronic kidney disease stage IV, chronic steroid therapy reason not known, diastolic CHF was brought to the ER after patient was found to be weak.  Patient states that she woke up in the middle of the night to go to the bathroom when she started feeling lightheaded.  She slowly sat down to the floor and patient husband called EMS.  Patient denies any chest pain shortness of breath nausea vomiting focal deficits diarrhea or any change in the medications.  Except that the patient has stopped taking Eliquis last 4 days due to patient being scheduled for kyphoplasty today.  ED Course: In the ER patient was found to be hypotensive febrile with elevated lactate level and leukocytosis chest x-ray showing infiltrates concerning for pneumonia.  Patient was started on sepsis protocol IV fluids and empiric antibiotic started.  Blood cultures have been obtained.  In addition patient is found to be hypoxic requiring 4 L oxygen.  D-dimer was done which is elevated and VQ scan has been ordered.  Review of Systems: As per HPI, rest all negative.   Past Medical History:  Diagnosis Date  . Aortic atherosclerosis ([place]) 12/25/2016  . Aortic stenosis, mild 12/14/2015  . Arthritis 08-28-11   right knee pain-surgery planned  . Cancer [facility]) 08-28-11   Melonoma-cheek(many Yrs ago) , recent bx. left  face lesion, past skin cancer lesions  . CAP (community acquired pneumonia) 12/13/2015  . CHF (congestive heart failure) ([place])   . Complication of anesthesia 08-28-11   in past arrythmia-  cardiology has seen in past  . Dysrhythmia 08-28-11   only immed.  to several days after surgery- hx. A.fib. in past  . GERD (gastroesophageal reflux disease) 08-28-11   tx. omeprazole  . Hypercholesterolemia   . Hypertension 08-28-11   tx. meds  . IBS (irritable bowel syndrome)   . Lymphedema   . Neuromuscular disorder ([place]) 08-28-11   hx. Polio age 13-slight residual affects legs  . Osteoarthritis    back and left knee  . Osteoporosis   . Pneumonia 12/2015   hospital x 3 days  . PONV (postoperative nausea and vomiting)   . Renal cyst, right   . Sarcoidosis   . Swelling of extremity 08-28-11   bilateral lower extremities- mid calf down    Past Surgical History:  Procedure Laterality Date  . ABDOMINAL HYSTERECTOMY  08-28-11   Partial-vaginal approach  . BREAST REDUCTION SURGERY  1997  . BUNIONECTOMY  08-28-11   right  . CARDIOVERSION N/A 08/05/2014   Procedure: CARDIOVERSION;  Surgeon: Larey Dresser, MD;  Location: [place];  Service: Cardiovascular;  Laterality: N/A;  . CATARACT EXTRACTION, BILATERAL  08-28-11   bilateral  . COLONOSCOPY WITH PROPOFOL N/A 12/24/2016   Procedure: COLONOSCOPY WITH PROPOFOL;  Surgeon: Wonda Horner, MD;  Location: [facility] ENDOSCOPY;  Service: Endoscopy;  Laterality: N/A;  . EYE SURGERY     Cataract  . IR VERTEBROPLASTY EA ADDL (T&LS) BX INC UNI/BIL INC INJECT/IMAGING  10/22/2017  . IR VERTEBROPLASTY LUMBAR BX INC UNI/BIL INC/INJECT/IMAGING  10/22/2017  . JOINT REPLACEMENT  Oct. 1, 2012   Right Knee  . KNEE ARTHROSCOPY  09/06/2011   Procedure: ARTHROSCOPY KNEE;  Surgeon: Gearlean Alf, MD;  Location: [facility];  Service: Orthopedics;  Laterality: Right;  with Synovectomy     reports that she has never smoked. She has never used smokeless tobacco. She reports that she does not drink alcohol or use drugs.  Allergies  Allergen Reactions  . Augmentin [Amoxicillin-Pot Clavulanate] Diarrhea and Nausea Only  . Boniva [Ibandronic Acid] Diarrhea  . Codeine Other (See Comments)    "Spaced out feeling" and Lightheadedness  . Hydromorphone Nausea And Vomiting  . Latex Rash    "Burns me"  . Lisinopril Cough  . Adhesive  [Tape] Itching and Rash    Family History  Problem Relation Age of Onset  . Heart disease Mother        Varicose Veins  . Hyperlipidemia Mother   . Hypertension Mother   . Colon cancer Mother   . Heart disease Father        Heart Disease before age 61  . Hypertension Father   . Emphysema Father   . Breast cancer Sister   . Stroke Maternal Grandmother   . Heart attack Brother     Prior to Admission medications   Medication Sig Start Date End Date Taking? Authorizing Provider  acetaminophen (TYLENOL) 500 MG tablet Take 1,000 mg by mouth every 8 (eight) hours as needed (for pain).    Yes [provider]  amiodarone (PACERONE) 200 MG tablet Take 1 tablet (200 mg total) by mouth daily. 07/17/17  Yes Jettie Booze, MD  atorvastatin (LIPITOR) 10 MG tablet Take 1 tablet (10 mg total) by mouth daily. 09/08/17  Yes Jettie Booze, MD  ELIQUIS 2.5 MG TABS tablet TAKE 1 TABLET BY MOUTH TWICE A DAY Patient taking differently: Take 2.5 mg by mouth 2 (two) times daily.  12/22/17  Yes Jettie Booze, MD  furosemide (LASIX) 20 MG tablet Take one tablet (20mg  total) by mouth daily, alternating with 2 tablets by mouth daily (40mg  total). Patient taking differently: Take 20 mg by mouth daily.  11/24/17  Yes Isaiah Serge, NP  latanoprost (XALATAN) 0.005 % ophthalmic solution Place 1 drop into both eyes at bedtime. 08/11/14  Yes [provider]  mirtazapine (REMERON) 15 MG tablet Take 15 mg by mouth at bedtime.   Yes [provider]  potassium chloride (K-DUR) 10 MEQ tablet Take one tablet by mouth daily on the days that you take Furosemide 40mg . Patient taking differently: Take 10 mEq by mouth daily.  11/24/17  Yes Isaiah Serge, NP  predniSONE (DELTASONE) 10 MG tablet Take 10 mg by mouth daily with breakfast.   Yes [provider]  traMADol (ULTRAM) 50 MG tablet Take 100 mg by mouth 3 (three) times daily. 01/20/18  Yes [provider]    bisacodyl (DULCOLAX) 10 MG suppository Place 1 suppository (10 mg total) rectally daily as needed for moderate constipation. Patient not taking: Reported on 01/26/2018 10/23/17   Samuella Cota, MD  gabapentin (NEURONTIN) 100 MG capsule Take 1 capsule (100 mg total) by mouth at bedtime. Patient not taking: Reported on 01/26/2018 10/23/17   Samuella Cota, MD  oxyCODONE (OXY IR/ROXICODONE) 5 MG immediate release tablet Take 1 tablet (5 mg total) by mouth every 6 (six) hours as needed for severe pain. Patient not taking: Reported on 01/26/2018 10/23/17   Samuella Cota, MD  polyethylene glycol [facility] Floria Raveling) packet Take 17 g by mouth 2 (two) times daily. Patient not taking: Reported on 01/26/2018 10/23/17   Samuella Cota, MD  Respiratory Therapy Supplies (FLUTTER) DEVI  Use as directed. 02/06/16   Mannam, Hart Robinsons, MD  senna (SENOKOT) 8.6 MG TABS tablet Take 1 tablet (8.6 mg total) by mouth at bedtime. Patient not taking: Reported on 01/26/2018 10/23/17   Samuella Cota, MD    Physical Exam: Vitals:   01/26/18 0430 01/26/18 0500 01/26/18 0515 01/26/18 0530  BP: 118/76 116/71 116/71 114/72  Pulse: 83 81 81 81  Resp: 20 15 17 17   Temp:   97.7 F (36.5 C)   TempSrc:   Oral   SpO2: 92% 93% 94% 93%      Constitutional: Moderately built and nourished. Vitals:   01/26/18 0430 01/26/18 0500 01/26/18 0515 01/26/18 0530  BP: 118/76 116/71 116/71 114/72  Pulse: 83 81 81 81  Resp: 20 15 17 17   Temp:   97.7 F (36.5 C)   TempSrc:   Oral   SpO2: 92% 93% 94% 93%   Eyes: Anicteric no pallor. ENMT: No discharge from the ears eyes nose or mouth. Neck: No mass or.  No neck rigidity but no JVD appreciated. Respiratory: No rhonchi or crepitations. Cardiovascular: S1-S2 heard no murmurs appreciated. Abdomen: Soft nontender bowel sounds present. Musculoskeletal: No edema.  No joint effusion. Skin: No rash. Neurologic: Alert awake oriented to time place and person.  Moves all  extremities. Psychiatric: Appears normal per normal affect.   Labs on Admission: I have personally reviewed following labs and imaging studies  CBC: Recent Labs  Lab 01/26/18 0307  WBC 13.7*  NEUTROABS 11.8*  HGB 16.3*  HCT 54.9*  MCV 98.6  PLT 468   Basic Metabolic Panel: Recent Labs  Lab 01/26/18 0307  NA 141  K 4.4  CL 102  CO2 27  GLUCOSE 161*  BUN 24*  CREATININE 1.93*  CALCIUM 8.9   GFR: CrCl cannot be calculated (Unknown ideal weight.). Liver Function Tests: No results for input(s): AST, ALT, ALKPHOS, BILITOT, PROT, ALBUMIN in the last 168 hours. No results for input(s): LIPASE, AMYLASE in the last 168 hours. No results for input(s): AMMONIA in the last 168 hours. Coagulation Profile: No results for input(s): INR, PROTIME in the last 168 hours. Cardiac Enzymes: No results for input(s): CKTOTAL, CKMB, CKMBINDEX, TROPONINI in the last 168 hours. BNP (last 3 results) No results for input(s): PROBNP in the last 8760 hours. HbA1C: No results for input(s): HGBA1C in the last 72 hours. CBG: Recent Labs  Lab 01/26/18 0316  GLUCAP 166*   Lipid Profile: No results for input(s): CHOL, HDL, LDLCALC, TRIG, CHOLHDL, LDLDIRECT in the last 72 hours. Thyroid Function Tests: No results for input(s): TSH, T4TOTAL, FREET4, T3FREE, THYROIDAB in the last 72 hours. Anemia Panel: No results for input(s): VITAMINB12, FOLATE, FERRITIN, TIBC, IRON, RETICCTPCT in the last 72 hours. Urine analysis:    Component Value Date/Time   COLORURINE YELLOW 10/16/2017 2117   APPEARANCEUR CLEAR 10/16/2017 2117   APPEARANCEUR Clear 07/17/2017 1354   LABSPEC 1.011 10/16/2017 2117   PHURINE 5.0 10/16/2017 2117   GLUCOSEU NEGATIVE 10/16/2017 2117   HGBUR MODERATE (A) 10/16/2017 2117   BILIRUBINUR NEGATIVE 10/16/2017 2117   BILIRUBINUR Negative 07/17/2017 1354   KETONESUR NEGATIVE 10/16/2017 2117   PROTEINUR NEGATIVE 10/16/2017 2117   UROBILINOGEN 0.2 02/05/2011 0915   NITRITE  NEGATIVE 10/16/2017 2117   LEUKOCYTESUR NEGATIVE 10/16/2017 2117   LEUKOCYTESUR Negative 07/17/2017 1354   Sepsis Labs: @LABRCNTIP (procalcitonin:4,lacticidven:4) )No results found for this or any previous visit (from the past 240 hour(s)).   Radiological Exams on Admission: Ct Head Wo Contrast  Result  Date: 01/26/2018 CLINICAL DATA:  Dizziness with standing. LEFT-sided weakness. History of atrial fibrillation, hypercholesterolemia, sarcoidosis. EXAM: CT HEAD WITHOUT CONTRAST TECHNIQUE: Contiguous axial images were obtained from the base of the skull through the vertex without intravenous contrast. COMPARISON:  MRI head November 23, 2015 FINDINGS: BRAIN: No intraparenchymal hemorrhage, mass effect nor midline shift. Moderate to severe parenchymal brain volume loss. No hydrocephalus. Old small LEFT cerebellar infarct. Patchy supratentorial white matter. No acute large vascular territory infarcts. No abnormal extra-axial fluid collections. Basal cisterns are patent. 6 mm pineal cyst. VASCULAR: Moderate calcific atherosclerosis of the carotid siphons. SKULL: No skull fracture. Moderate RIGHT temporomandibular osteoarthrosis. Osteopenia. No significant scalp soft tissue swelling. SINUSES/ORBITS: Mild paranasal sinus mucosal thickening. RIGHT frontal sinus osteoma. Mastoid air cells are well aerated.The included ocular globes and orbital contents are non-suspicious. Status post bilateral ocular lens implants. OTHER: None. IMPRESSION: 1. No acute intracranial process. 2. Stable moderate to severe parenchymal brain volume loss. 3. Progressed moderate chronic small vessel ischemic changes. Electronically Signed   By: Elon Alas M.D.   On: 01/26/2018 03:40   Dg Chest Portable 1 View  Result Date: 01/26/2018 CLINICAL DATA:  Fever, hypoxia tonight. EXAM: PORTABLE CHEST 1 VIEW COMPARISON:  Chest radiograph October 15, 2017 and December 26, 2015 FINDINGS: Increasing elevated RIGHT hemidiaphragm. RIGHT mid lung zone  bandlike density overlapping fissure. Mild bronchitic changes. Patchy airspace opacity silhouetting RIGHT pulmonary hilum. Cardiac silhouette is normal size. Calcified aortic arch. Biapical pleural thickening. No pneumothorax. Osteopenia. IMPRESSION: Bronchitic changes with RIGHT atelectasis. Potential RIGHT perihilar pneumonia. Aortic Atherosclerosis (ICD10-I70.0). Electronically Signed   By: Elon Alas M.D.   On: 01/26/2018 03:48    EKG: Independently reviewed.  Normal sinus rhythm with LBBB.  Assessment/Plan Principal Problem:   Sepsis ([place]) Active Problems:   PAF (paroxysmal atrial fibrillation) (HCC)   CKD (chronic kidney disease), stage IV (H[facility])   Essential hypertension   Anemia in chronic renal disease    1. Sepsis likely from pneumonia -patient has been placed on Levaquin 4.  Acquired pneumonia.  Check blood cultures follow lactate levels follow procalcitonin level continue with hydration.  Note that patient has history of diastolic CHF and is on Lasix at home which has been held for now closely follow respiratory status. 2. Acute respiratory failure likely could be from pneumonia or possible developing CHF closely observe.  If d-dimer is markedly elevated for which VQ scan has been ordered.  EKG shows normal sinus rhythm with LBBB which appears to be new.  Denies any chest pain.  We will cycle cardiac markers. 3. Paroxysmal atrial fibrillation presently in sinus rhythm on amiodarone.  Eliquis on hold in anticipation of kyphoplasty. 4. Chronic kidney disease stage IV creatinine appears to be at baseline.  Followed by Dr. Valentina Lucks. 5. On chronic steroid therapy reason not clear.  Will keep patient on stress dose steroids. 6. History of diastolic CHF holding off Lasix for now due to sepsis picture.  Positive follow respiratory status. 7. Hyperlipidemia on statins.  At admission patient was hypoxic hypotensive febrile with markedly elevated d-dimer.  Patient had leukocytosis and  given the constellation of findings with sepsis picture patient likely will need inpatient admission.   DVT prophylaxis: Heparin infusion. Code Status: DNR. Family Communication: Patient husband. Disposition Plan: Home. Consults called: None. Admission status: Inpatient.   Rise Patience MD [facility] Pager [phone].  If 7PM-7AM, please contact night-coverage [email or web address] Password [facility]  01/26/2018, 6:31 AM

## 2018-01-26 NOTE — ED Triage Notes (Signed)
Pt BIB [facility] from home, pt became dizzy on standing and was assisted to the floor by family. Denies LOC, did not hit her head. Skin tears to the right upper arm and right wrist. Initially hypotensive with EMS, BP 88/61, improved to 148/89, HR 89, CBG 174. Given 4mg  zofran PTA, pt reports she is scheduled for back surgery today.

## 2018-01-26 NOTE — ED Notes (Signed)
Dr Leonides Schanz informed of troponin results .[place]

## 2018-01-26 NOTE — ED Notes (Signed)
Phlebotomy at bedside.

## 2018-01-26 NOTE — ED Notes (Signed)
Heather Benson  [id number]  Patient has not had any urine output. Bladder scan 422.  Carmelina Paddock RN

## 2018-01-26 NOTE — ED Notes (Signed)
Phlebotomy came to draw labs, pt heading to V/Q scan.   Phlebotomy will come back once pt returns.

## 2018-01-26 NOTE — ED Notes (Signed)
Was able to get blood, the Nurse was informed.

## 2018-01-26 NOTE — Consult Note (Addendum)
Cardiology Consultation:   Patient ID: Heather Benson MRN: [id number]; DOB: March 29, 1932  Admit date: 01/26/2018 Date of Consult: 01/26/2018  Primary Care Provider: [name], MD Primary Cardiologist: Larae Grooms, MD  Primary Electrophysiologist:  None    Patient Profile:   Heather Benson is a 84 y.o. female with a hx of paroxysmal Afib (on amiodarone; stopped Eliquis x4 days in preparation for 9/16 kyphoplasty) s/p 07/2014 DCCV, HFpEF (EF 55-60%, 2017), h/o SVT, HTN, Ao atherosclerosis, h/o dizziness and palpitations, GERD, CKD IV with h/l cysts, and recent L1 compression with scheduled kyphoplasty for today 9/16 who is being seen today for the evaluation of syncope and elevated troponin at the request of Dr. Marthenia Rolling.  History of Present Illness:   Heather Benson is a 84 yo female with past medical history as above and seen today for syncope with troponin elevation after stopping Eliquis x4 days d/t kyphoplasty procedure today.  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 She felt better after starting back on her Amiodarone though continued to c/o dizziness and palpitations. Lasix was decreased at that time d/t bump in creatinine. WBC elevated and attributed to prednisone. Per this documentation, the plan was to recheck LFTs and CMP and follow-up with Dr. Irish Lack in 6 months.   01/26/18: Patient presented to [facility] after waking in the middle of the night to use the restroom and feeling weak and lightheaded. Per patient report, she did experience syncope with LOC but rather collapse d/t leg weakness and some nausea around 1AM this morning and directly after getting out of bed. Her husband reportedly called EMS. She was scheduled for a kyphoplasty today (01/26/18) and has therefore not been on her Eliquis for the last 4 days. In the ED, she denied any CP, SOB, n//v/d.  In the ED, patient was hypotensive and febrile with elevated  lactate and leukocytosis. CXR showed infiltrates concerning for R sided pna and R atelectasis. Sepsis protocol was started and patient started on IVF and empiric abx, as well as 4L oxygen. Ddimer elevated and VQ scan ordered and found negative. EKG SR, 88bpm with intraventricular repolarization abnormality rather than a true LBBB, previous EKGs show repolarization abnormality progressively worse over time. Troponin 0.64  0.91  Since admission:  Temp 97.7-100.5 F, HR 69-98, RR 82-42, BP systolic 35-361 /44-31, SpO2 68-99 Pending Echo  Past Medical History:  Diagnosis Date  . Aortic atherosclerosis ([place]) 12/25/2016  . Aortic stenosis, mild 12/14/2015  . Arthritis 08-28-11   right knee pain-surgery planned  . Cancer [facility]) 08-28-11   Melonoma-cheek(many Yrs ago) , recent bx. left  face lesion, past skin cancer lesions  . CAP (community acquired pneumonia) 12/13/2015  . CHF (congestive heart failure) ([place])   . Complication of anesthesia 08-28-11   in past arrythmia-  cardiology has seen in past  . Dysrhythmia 08-28-11   only immed.  to several days after surgery- hx. A.fib. in past  . GERD (gastroesophageal reflux disease) 08-28-11   tx. omeprazole  . Hypercholesterolemia   . Hypertension 08-28-11   tx. meds  . IBS (irritable bowel syndrome)   . Lymphedema   . Neuromuscular disorder ([place]) 08-28-11   hx. Polio age 84-slight residual affects legs  . Osteoarthritis    back and left knee  . Osteoporosis   . Pneumonia 12/2015   hospital x 3 days  . PONV (postoperative nausea and vomiting)   . Renal cyst, right   . Sarcoidosis   .  Swelling of extremity 08-28-11   bilateral lower extremities- mid calf down    Past Surgical History:  Procedure Laterality Date  . ABDOMINAL HYSTERECTOMY  08-28-11   Partial-vaginal approach  . BREAST REDUCTION SURGERY  1997  . BUNIONECTOMY  08-28-11   right  . CARDIOVERSION N/A 08/05/2014   Procedure: CARDIOVERSION;  Surgeon: Larey Dresser, MD;  Location: [facility];  Service: Cardiovascular;  Laterality: N/A;  . CATARACT EXTRACTION, BILATERAL  08-28-11   bilateral  . COLONOSCOPY WITH PROPOFOL N/A 12/24/2016   Procedure: COLONOSCOPY WITH PROPOFOL;  Surgeon: Wonda Horner, MD;  Location: [facility] ENDOSCOPY;  Service: Endoscopy;  Laterality: N/A;  . EYE SURGERY     Cataract  . IR VERTEBROPLASTY EA ADDL (T&LS) BX INC UNI/BIL INC INJECT/IMAGING  10/22/2017  . IR VERTEBROPLASTY LUMBAR BX INC UNI/BIL INC/INJECT/IMAGING  10/22/2017  . JOINT REPLACEMENT  Oct. 1, 2012   Right Knee  . KNEE ARTHROSCOPY  09/06/2011   Procedure: ARTHROSCOPY KNEE;  Surgeon: Gearlean Alf, MD;  Location: [facility] ORS;  Service: Orthopedics;  Laterality: Right;  with Synovectomy     Home Medications:  Prior to Admission medications   Medication Sig Start Date End Date Taking? Authorizing Provider  acetaminophen (TYLENOL) 500 MG tablet Take 1,000 mg by mouth every 8 (eight) hours as needed (for pain).    Yes [provider]  amiodarone (PACERONE) 200 MG tablet Take 1 tablet (200 mg total) by mouth daily. 07/17/17  Yes Jettie Booze, MD  atorvastatin (LIPITOR) 10 MG tablet Take 1 tablet (10 mg total) by mouth daily. 09/08/17  Yes Jettie Booze, MD  ELIQUIS 2.5 MG TABS tablet TAKE 1 TABLET BY MOUTH TWICE A DAY Patient taking differently: Take 2.5 mg by mouth 2 (two) times daily.  12/22/17  Yes Jettie Booze, MD  furosemide (LASIX) 20 MG tablet Take one tablet (20mg  total) by mouth daily, alternating with 2 tablets by mouth daily (40mg  total). Patient taking differently: Take 20 mg by mouth daily.  11/24/17  Yes Isaiah Serge, NP  latanoprost (XALATAN) 0.005 % ophthalmic solution Place 1 drop into both eyes at bedtime. 08/11/14  Yes [provider]  mirtazapine (REMERON) 15 MG tablet Take 15 mg by mouth at bedtime.   Yes [provider]  potassium chloride (K-DUR) 10 MEQ tablet Take one tablet by mouth daily on the days that you take Furosemide  40mg . Patient taking differently: Take 10 mEq by mouth daily.  11/24/17  Yes Isaiah Serge, NP  predniSONE (DELTASONE) 10 MG tablet Take 10 mg by mouth daily with breakfast.   Yes [provider]  traMADol (ULTRAM) 50 MG tablet Take 100 mg by mouth 3 (three) times daily. 01/20/18  Yes [provider]  bisacodyl (DULCOLAX) 10 MG suppository Place 1 suppository (10 mg total) rectally daily as needed for moderate constipation. Patient not taking: Reported on 01/26/2018 10/23/17   Samuella Cota, MD  gabapentin (NEURONTIN) 100 MG capsule Take 1 capsule (100 mg total) by mouth at bedtime. Patient not taking: Reported on 01/26/2018 10/23/17   Samuella Cota, MD  oxyCODONE (OXY IR/ROXICODONE) 5 MG immediate release tablet Take 1 tablet (5 mg total) by mouth every 6 (six) hours as needed for severe pain. Patient not taking: Reported on 01/26/2018 10/23/17   Samuella Cota, MD  polyethylene glycol [facility] Floria Raveling) packet Take 17 g by mouth 2 (two) times daily. Patient not taking: Reported on 01/26/2018 10/23/17   Sarajane Jews,  Melene Plan, MD  Respiratory Therapy Supplies (FLUTTER) DEVI Use as directed. 02/06/16   Mannam, Hart Robinsons, MD  senna (SENOKOT) 8.6 MG TABS tablet Take 1 tablet (8.6 mg total) by mouth at bedtime. Patient not taking: Reported on 01/26/2018 10/23/17   Samuella Cota, MD    Inpatient Medications: Scheduled Meds: . amiodarone  200 mg Oral Daily  . aspirin  325 mg Oral Daily  . atorvastatin  10 mg Oral Daily  . cefdinir  300 mg Oral Daily  . heparin  5,000 Units Subcutaneous Q8H  . hydrocortisone sod succinate (SOLU-CORTEF) inj  50 mg Intravenous Q8H  . latanoprost  1 drop Both Eyes QHS  . mirtazapine  15 mg Oral QHS   Continuous Infusions: . sodium chloride 100 mL/hr at 01/26/18 0733   PRN Meds: acetaminophen **OR** acetaminophen, ondansetron **OR** ondansetron (ZOFRAN) IV  Allergies:    Allergies  Allergen Reactions  . Augmentin [Amoxicillin-Pot  Clavulanate] Diarrhea and Nausea Only  . Boniva [Ibandronic Acid] Diarrhea  . Codeine Other (See Comments)    "Spaced out feeling" and Lightheadedness  . Hydromorphone Nausea And Vomiting  . Latex Rash    "Burns me"  . Lisinopril Cough  . Adhesive [Tape] Itching and Rash    Social History:   Social History   Socioeconomic History  . Marital status: Married    Spouse name: [name]  . Number of children: 3  . Years of education: 15  . Highest education level: Not on file  Occupational History  . Occupation: Retired- [employer]  . Financial resource strain: Not on file  . Food insecurity:    Worry: Not on file    Inability: Not on file  . Transportation needs:    Medical: Not on file    Non-medical: Not on file  Tobacco Use  . Smoking status: Never Smoker  . Smokeless tobacco: Never Used  Substance and Sexual Activity  . Alcohol use: No    Alcohol/week: 0.0 standard drinks  . Drug use: No  . Sexual activity: Not Currently    Partners: Male  Lifestyle  . Physical activity:    Days per week: Not on file    Minutes per session: Not on file  . Stress: Not on file  Relationships  . Social connections:    Talks on phone: Not on file    Gets together: Not on file    Attends religious service: Not on file    Active member of club or organization: Not on file    Attends meetings of clubs or organizations: Not on file    Relationship status: Not on file  . Intimate partner violence:    Fear of current or ex partner: Not on file    Emotionally abused: Not on file    Physically abused: Not on file    Forced sexual activity: Not on file  Other Topics Concern  . Not on file  Social History Narrative   Lives with husband, Hilbert Odor at home   Caffeine use: Drinks coffee/tea/caffeine free soda   OCCUPATION: retired     Family History:    Family History  Problem Relation Age of Onset  . Heart disease Mother        Varicose Veins  . Hyperlipidemia Mother     . Hypertension Mother   . Colon cancer Mother   . Heart disease Father        Heart Disease before age 49  . Hypertension  Father   . Emphysema Father   . Breast cancer Sister   . Stroke Maternal Grandmother   . Heart attack Brother      ROS:  Please see the history of present illness.   All other ROS reviewed and negative.     Physical Exam/Data:   Vitals:   01/26/18 1015 01/26/18 1045 01/26/18 1048 01/26/18 1201  BP: 117/75 110/73  119/78  Pulse: 72 69 69   Resp: (!) 21 16 15 20   Temp:    98.1 F (36.7 C)  TempSrc:    Oral  SpO2: 99% 99% 99% 97%  Weight:    75.5 kg  Height:    5\' 4"  (1.626 m)    Intake/Output Summary (Last 24 hours) at 01/26/2018 1544 Last data filed at 01/26/2018 0925 Gross per 24 hour  Intake 1483 ml  Output -  Net 1483 ml   Filed Weights   01/26/18 1201  Weight: 75.5 kg   Body mass index is 28.58 kg/m.  General:  Well nourished, well developed, in no acute distress HEENT: normal Lymph: no adenopathy Neck: no JVD Endocrine:  No thryomegaly Vascular: No carotid bruits; FA pulses 2+ bilaterally without bruits  Cardiac:  Currently in sinus - normal S1, S2; RRR; no murmur  Lungs:  Diffuse wheezing and rhonchi b/l through lung fields  Abd: soft, nontender, somewhat distended. no hepatomegaly  Ext: no edema Musculoskeletal:  No deformities, BUE and BLE strength normal and equal Skin: warm and dry  Neuro:  CNs 2-12 intact, no focal abnormalities noted Psych:  Normal affect   EKG: Refer to HPI Telemetry:  Telemetry was personally reviewed and demonstrates:  NSR, HR 70-80s  CV Studies:   Relevant CV Studies:  24-Jan-2016 TTE Left ventricle: The cavity size was normal. There was moderate   concentric hypertrophy. Systolic function was normal. The   estimated ejection fraction was in the range of 55% to 60%. Left   ventricular diastolic function parameters were normal. - Aortic valve: There was very mild stenosis. There was trivial    regurgitation. Valve area (VTI): 1.23 cm^2. Valve area (Vmax):   1.51 cm^2. Valve area (Vmean): 1.27 cm^2. - Mitral valve: Calcified annulus. There was mild regurgitation. - Atrial septum: No defect or patent foramen ovale was identified.  Laboratory Data:  Chemistry Recent Labs  Lab 01/26/18 0307 01/26/18 1303  NA 141 139  K 4.4 4.7  CL 102 105  CO2 27 24  GLUCOSE 161* 108*  BUN 24* 22  CREATININE 1.93* 1.79*  CALCIUM 8.9 7.9*  GFRNONAA 23* 25*  GFRAA 26* 29*  ANIONGAP 12 10    Recent Labs  Lab 01/26/18 1303  PROT 4.7*  ALBUMIN 2.2*  AST 39  ALT 38  ALKPHOS 113  BILITOT 1.1   Hematology Recent Labs  Lab 01/26/18 0307 01/26/18 1016  WBC 13.7* 11.9*  RBC 5.57* 4.86  HGB 16.3* 14.3  HCT 54.9* 48.5*  MCV 98.6 99.8  MCH 29.3 29.4  MCHC 29.7* 29.5*  RDW 16.6* 16.6*  PLT 170 147*   Cardiac Enzymes Recent Labs  Lab 01/26/18 1303  TROPONINI 0.91*    Recent Labs  Lab 01/26/18 0326  TROPIPOC 0.64*    BNP Recent Labs  Lab 01/26/18 0307  BNP 384.8*    DDimer  Recent Labs  Lab 01/26/18 0307  DDIMER 13.53*    Radiology/Studies:  Ct Head Wo Contrast  Result Date: 01/26/2018 CLINICAL DATA:  Dizziness with standing. LEFT-sided weakness. History of atrial  fibrillation, hypercholesterolemia, sarcoidosis. EXAM: CT HEAD WITHOUT CONTRAST TECHNIQUE: Contiguous axial images were obtained from the base of the skull through the vertex without intravenous contrast. COMPARISON:  MRI head November 23, 2015 FINDINGS: BRAIN: No intraparenchymal hemorrhage, mass effect nor midline shift. Moderate to severe parenchymal brain volume loss. No hydrocephalus. Old small LEFT cerebellar infarct. Patchy supratentorial white matter. No acute large vascular territory infarcts. No abnormal extra-axial fluid collections. Basal cisterns are patent. 6 mm pineal cyst. VASCULAR: Moderate calcific atherosclerosis of the carotid siphons. SKULL: No skull fracture. Moderate RIGHT  temporomandibular osteoarthrosis. Osteopenia. No significant scalp soft tissue swelling. SINUSES/ORBITS: Mild paranasal sinus mucosal thickening. RIGHT frontal sinus osteoma. Mastoid air cells are well aerated.The included ocular globes and orbital contents are non-suspicious. Status post bilateral ocular lens implants. OTHER: None. IMPRESSION: 1. No acute intracranial process. 2. Stable moderate to severe parenchymal brain volume loss. 3. Progressed moderate chronic small vessel ischemic changes. Electronically Signed   By: Elon Alas M.D.   On: 01/26/2018 03:40   Ct Chest Wo Contrast  Result Date: 01/26/2018 CLINICAL DATA:  Fever and hypoxia. Pneumonia, unresolved for complicated EXAM: CT CHEST WITHOUT CONTRAST TECHNIQUE: Multidetector CT imaging of the chest was performed following the standard protocol without IV contrast. COMPARISON:  12/03/2013 FINDINGS: Cardiovascular: No cardiomegaly or pericardial effusion. There is mitral and aortic annular calcification. Degree of aortic valve calcification. Atherosclerosis of the aorta and coronaries. Mediastinum/Nodes: Negative for adenopathy or mass. Lungs/Pleura: Streaky opacity with volume loss in the right perihilar region and right lower lobe, consistent with scarring. This was also seen previously, with accentuation likely from lower lung volumes today. There is a degree of subpleural reticulation also on the left, nonspecific. No honeycombing. No air bronchogram, edema, effusion, or masslike finding Upper Abdomen: No acute finding Musculoskeletal: No acute or aggressive finding. IMPRESSION: 1. Right perihilar atelectasis/scarring. No consolidating pneumonia. 2. Low lung volumes with nonspecific subpleural reticulation. Electronically Signed   By: Monte Fantasia M.D.   On: 01/26/2018 11:30   Nm Pulmonary Vent And Perf (v/q Scan)  Result Date: 01/26/2018 CLINICAL DATA:  Shortness of breath. EXAM: NUCLEAR MEDICINE VENTILATION - PERFUSION LUNG SCAN  TECHNIQUE: Ventilation images were obtained in multiple projections using inhaled aerosol Tc-59m DTPA. Perfusion images were obtained in multiple projections after intravenous injection of Tc-39m-MAA. RADIOPHARMACEUTICALS:  Thirty-two mCi of Tc-66m DTPA aerosol inhalation and 4.3 mCi Tc67m-MAA IV COMPARISON:  Chest x-rays dated 01/26/2018 and 10/15/2017 FINDINGS: The patient has chronic elevation of the right hemidiaphragm. The patient swallowed moderate amount of the DTPA. Ventilation: No focal ventilation defect. Perfusion: No wedge shaped peripheral perfusion defects to suggest acute pulmonary embolism. IMPRESSION: Normal ventilation perfusion defect. Electronically Signed   By: Lorriane Shire M.D.   On: 01/26/2018 09:15   Dg Chest Portable 1 View  Result Date: 01/26/2018 CLINICAL DATA:  Fever, hypoxia tonight. EXAM: PORTABLE CHEST 1 VIEW COMPARISON:  Chest radiograph October 15, 2017 and December 26, 2015 FINDINGS: Increasing elevated RIGHT hemidiaphragm. RIGHT mid lung zone bandlike density overlapping fissure. Mild bronchitic changes. Patchy airspace opacity silhouetting RIGHT pulmonary hilum. Cardiac silhouette is normal size. Calcified aortic arch. Biapical pleural thickening. No pneumothorax. Osteopenia. IMPRESSION: Bronchitic changes with RIGHT atelectasis. Potential RIGHT perihilar pneumonia. Aortic Atherosclerosis (ICD10-I70.0). Electronically Signed   By: Elon Alas M.D.   On: 01/26/2018 03:48    Assessment and Plan:   Syncope with elevated troponin in the setting of sepsis - No active CP. No CP leading up to the event.  No syncope but  rather leg weakness per patient report. - Troponin mildly elevated at 0.91. Continue to cycle. EKG without STE. Heart Score ~ 5 in the setting of acute illness. (EKG 1, age 23, RF 1, Tn 1/2). Continue to cycle troponin and EKG. - On heparin. Daily CBC  - Not on anticoagulation d/t upcoming procedure. Will continue to hold until get results of echo to be done  today 9/16. - Suspect troponin elevation in the setting of demand ischemia d/t recent illness and less likely cardiac etiology. Will continue to monitor. - Further management and recommendations pending results of today's echo.   HFpEF, pending updated echo results  - Pending results of today's Echo. 2017 echo as above with normal EF - Lasix recently decreased d/t renal function - Further recommendations pending results of today's echo as above.   Paroxysmal Afib, Eliquis was on hold d/t scheduled procedure  - HR controlled.  - K 4.4  4.7. No recent Mg.  - Continue to monitor electrolytes with BMET  - Continue amiodarone.  - Recommend restart anticoagulation with eliquis if echo without acute changes as will likely not undergo procedure in the setting of illness.  HTN, controlled - Hypertensive upon presentation at 150/87 - BP now controlled - Continue to monitor.   HLD - Continue lipitor  - LDL ~ goal at 71 with goal <70  CKD IV - Lasix decreased d/t renal function - Cr 1.93  1.79 and close to baseline  - Continue to monitor   Sepsis  - Continue abx - Per IM  For questions or updates, please contact [facility] Please consult [email or web address] for contact info under     Signed, Arvil Chaco, PA-C  01/26/2018 3:44 PM   I have seen and examined the patient along with Arvil Chaco, PA-C .  I have reviewed the chart, notes and new data.  I agree with PA/NP's note.  Key new complaints: She denies having chest pain at any point during her presentation or since. Key examination changes: Normal cardiovascular exam.  Currently normal sinus rhythm Key new findings / data: Minor increase in cardiac troponin I, peaking at just under 1.0.  Nondiagnostic ECG due to pre-existing major intraventricular conduction delay (atypical LBBB).  PLAN: Demand myocardial injury in the setting of sepsis and major hemodynamic changes. Will check echocardiogram.  If there are no  regional wall motion abnormalities and if overall left ventricular systolic function is normal, do not plan invasive coronary evaluation.  She has advanced chronic kidney disease and is not a good candidate for angiography. Appropriate to continue treatment of coronary risk factors.  She is not chronically on aspirin due to anticoagulation for atrial fibrillation (currently interrupted for planned kyphoplasty). Her kyphoplasty will be delayed. .  In the meantime I would resume her anticoagulation, if her echocardiogram is normal.  Sanda Klein, MD, [place] [phone] 01/26/2018, 5:25 PM

## 2018-01-26 NOTE — ED Notes (Signed)
Dr Leonides Schanz informed of lactic acid results 2.50

## 2018-01-27 ENCOUNTER — Inpatient Hospital Stay (HOSPITAL_COMMUNITY): Payer: Medicare Other

## 2018-01-27 DIAGNOSIS — I255 Ischemic cardiomyopathy: Secondary | ICD-10-CM

## 2018-01-27 DIAGNOSIS — I519 Heart disease, unspecified: Secondary | ICD-10-CM

## 2018-01-27 DIAGNOSIS — I214 Non-ST elevation (NSTEMI) myocardial infarction: Secondary | ICD-10-CM

## 2018-01-27 DIAGNOSIS — I361 Nonrheumatic tricuspid (valve) insufficiency: Secondary | ICD-10-CM

## 2018-01-27 DIAGNOSIS — I5181 Takotsubo syndrome: Secondary | ICD-10-CM

## 2018-01-27 DIAGNOSIS — A419 Sepsis, unspecified organism: Secondary | ICD-10-CM

## 2018-01-27 DIAGNOSIS — I5032 Chronic diastolic (congestive) heart failure: Secondary | ICD-10-CM

## 2018-01-27 LAB — RENAL FUNCTION PANEL
Albumin: 2.2 g/dL — ABNORMAL LOW (ref 3.5–5.0)
Anion gap: 17 — ABNORMAL HIGH (ref 5–15)
BUN: 23 mg/dL (ref 8–23)
CO2: 18 mmol/L — ABNORMAL LOW (ref 22–32)
Calcium: 9 mg/dL (ref 8.9–10.3)
Chloride: 108 mmol/L (ref 98–111)
Creatinine, Ser: 1.66 mg/dL — ABNORMAL HIGH (ref 0.44–1.00)
GFR calc Af Amer: 32 mL/min — ABNORMAL LOW (ref >60)
GFR calc non Af Amer: 27 mL/min — ABNORMAL LOW (ref >60)
Glucose, Bld: 64 mg/dL — ABNORMAL LOW (ref 70–99)
Phosphorus: 3.9 mg/dL (ref 2.5–4.6)
Potassium: 4.5 mmol/L (ref 3.5–5.1)
Sodium: 143 mmol/L (ref 135–145)

## 2018-01-27 LAB — ECHOCARDIOGRAM COMPLETE
Height: 64 in
Weight: 2664 oz

## 2018-01-27 LAB — CBC WITH DIFFERENTIAL/PLATELET
Abs Immature Granulocytes: 0.1 10*3/uL (ref 0.0–0.1)
Basophils Absolute: 0.1 10*3/uL (ref 0.0–0.1)
Basophils Relative: 1 %
Eosinophils Absolute: 0 10*3/uL (ref 0.0–0.7)
Eosinophils Relative: 0 %
HCT: 50.7 % — ABNORMAL HIGH (ref 36.0–46.0)
Hemoglobin: 14.7 g/dL (ref 12.0–15.0)
Immature Granulocytes: 1 %
Lymphocytes Relative: 12 %
Lymphs Abs: 1.5 10*3/uL (ref 0.7–4.0)
MCH: 29.8 pg (ref 26.0–34.0)
MCHC: 29 g/dL — ABNORMAL LOW (ref 30.0–36.0)
MCV: 102.6 fL — ABNORMAL HIGH (ref 78.0–100.0)
Monocytes Absolute: 0.7 10*3/uL (ref 0.1–1.0)
Monocytes Relative: 5 %
Neutro Abs: 10.1 10*3/uL — ABNORMAL HIGH (ref 1.7–7.7)
Neutrophils Relative %: 81 %
Platelets: 133 10*3/uL — ABNORMAL LOW (ref 150–400)
RBC: 4.94 MIL/uL (ref 3.87–5.11)
RDW: 16.2 % — ABNORMAL HIGH (ref 11.5–15.5)
WBC: 12.5 10*3/uL — ABNORMAL HIGH (ref 4.0–10.5)

## 2018-01-27 LAB — TROPONIN I: Troponin I: 0.4 ng/mL (ref <0.03)

## 2018-01-27 LAB — GLUCOSE, CAPILLARY
GLUCOSE-CAPILLARY: 73 mg/dL (ref 70–99)
GLUCOSE-CAPILLARY: 110 mg/dL — AB (ref 70–99)
Glucose-Capillary: 191 mg/dL — ABNORMAL HIGH (ref 70–99)
Glucose-Capillary: 173 mg/dL — ABNORMAL HIGH (ref 70–99)

## 2018-01-27 LAB — HEPARIN LEVEL (UNFRACTIONATED): Heparin Unfractionated: 1.4 IU/mL — ABNORMAL HIGH (ref 0.30–0.70)

## 2018-01-27 LAB — APTT: aPTT: 192 seconds (ref 24–36)

## 2018-01-27 LAB — MAGNESIUM: Magnesium: 2 mg/dL (ref 1.7–2.4)

## 2018-01-27 MED ORDER — HEPARIN BOLUS VIA INFUSION
3500.0000 [IU] | Freq: Once | INTRAVENOUS | Status: AC
  Administered 2018-01-27: 3500 [IU] via INTRAVENOUS
  Filled 2018-01-27: qty 3500

## 2018-01-27 MED ORDER — HEPARIN (PORCINE) IN NACL 100-0.45 UNIT/ML-% IJ SOLN
700.0000 [IU]/h | INTRAMUSCULAR | Status: DC
  Administered 2018-01-30 – 2018-02-01 (×3): 700 [IU]/h via INTRAVENOUS
  Filled 2018-01-27 (×4): qty 250

## 2018-01-27 MED ORDER — HEPARIN (PORCINE) IN NACL 100-0.45 UNIT/ML-% IJ SOLN
1000.0000 [IU]/h | INTRAMUSCULAR | Status: DC
  Administered 2018-01-27: 1000 [IU]/h via INTRAVENOUS
  Filled 2018-01-27: qty 250

## 2018-01-27 MED ORDER — DEXTROSE 50 % IV SOLN
INTRAVENOUS | Status: AC
  Administered 2018-01-27: 50 mL
  Filled 2018-01-27: qty 50

## 2018-01-27 NOTE — Progress Notes (Signed)
ANTICOAGULATION CONSULT NOTE - Initial Consult  Pharmacy Consult for Heparin Indication: Atrial fibrillation  Allergies  Allergen Reactions  . Augmentin [Amoxicillin-Pot Clavulanate] Diarrhea and Nausea Only  . Boniva [Ibandronic Acid] Diarrhea  . Codeine Other (See Comments)    "Spaced out feeling" and Lightheadedness  . Hydromorphone Nausea And Vomiting  . Latex Rash    "Burns me"  . Lisinopril Cough  . Adhesive [Tape] Itching and Rash    Patient Measurements: Height: 5\' 4"  (162.6 cm) Weight: 166 lb 8 oz (75.5 kg) IBW/kg (Calculated) : 54.7 Heparin Dosing Weight: 70.5  Vital Signs: Temp: 97.6 F (36.4 C) (09/17 1142) Temp Source: Oral (09/17 1142) BP: 111/65 (09/17 1142) Pulse Rate: 79 (09/17 1142)  Labs: Recent Labs    01/26/18 0307 01/26/18 1016 01/26/18 1303 01/26/18 1546 01/27/18 0708  HGB 16.3* 14.3  --   --  14.7  HCT 54.9* 48.5*  --   --  50.7*  PLT 170 147*  --   --  133*  APTT  --  28  --   --   --   LABPROT  --  13.8  --   --   --   INR  --  1.07  --   --   --   CREATININE 1.93*  --  1.79*  --  1.66*  TROPONINI  --   --  0.91* 0.88* 0.40*    Estimated Creatinine Clearance: 25.1 mL/min (A) (by C-G formula based on SCr of 1.66 mg/dL (H)).   Medical History: Past Medical History:  Diagnosis Date  . Aortic atherosclerosis ([place]) 12/25/2016  . Aortic stenosis, mild 12/14/2015  . Arthritis 08-28-11   right knee pain-surgery planned  . Cancer [facility]) 08-28-11   Melonoma-cheek(many Yrs ago) , recent bx. left  face lesion, past skin cancer lesions  . CAP (community acquired pneumonia) 12/13/2015  . CHF (congestive heart failure) ([place])   . Complication of anesthesia 08-28-11   in past arrythmia-  cardiology has seen in past  . Dysrhythmia 08-28-11   only immed.  to several days after surgery- hx. A.fib. in past  . GERD (gastroesophageal reflux disease) 08-28-11   tx. omeprazole  . Hypercholesterolemia   . Hypertension 08-28-11   tx. meds  . IBS (irritable  bowel syndrome)   . Lymphedema   . Neuromuscular disorder ([place]) 08-28-11   hx. Polio age 24-slight residual affects legs  . Osteoarthritis    back and left knee  . Osteoporosis   . Pneumonia 12/2015   hospital x 3 days  . PONV (postoperative nausea and vomiting)   . Renal cyst, right   . Sarcoidosis   . Swelling of extremity 08-28-11   bilateral lower extremities- mid calf down   Assessment: 84 yo female with PAF on apixaban PTA. Apixaban on hold since 9/13 AM for kyphoplasty. Pharmacy has been consulted to start a heparin drip pending surgery. Baseline aPTT 28.   As patient has been off apixaban for 4 days, we will bolus heparin.   Goal of Therapy:  Heparin level 0.3-0.7 units/ml Monitor platelets by anticoagulation protocol: Yes   Plan:  Heparin bolus 3500 units x 1 Heparin drip at 1000 units/hr Heparin level and aPTT in 8 hours Daily Heparin level and CBC while on heparin drip.  Monitor for s/sx of bleeding  Saafir Abdullah A. Levada Dy, PharmD, [place] Pager: [phone] Please utilize Amion for appropriate phone number to reach the unit pharmacist ([place])    01/27/2018,1:18 PM

## 2018-01-27 NOTE — Progress Notes (Signed)
Progress Note  Patient Name: Heather Benson Date of Encounter: 01/27/2018  Primary Cardiologist: Larae Grooms, MD   Subjective   She continues to feel weak and is coughing a lot, but overall believes her breathing is better.  She denies any chest pain and reaffirms that she never had any chest discomfort throughout this entire acute illness. Her echocardiogram shows an extensive new wall motion abnormality, primarily corresponding to the LAD artery distribution, EF down to approximately 35%, no evidence of elevated left atrial filling pressures, but with moderate pulmonary hypertension.   Inpatient Medications    Scheduled Meds: . amiodarone  200 mg Oral Daily  . aspirin  325 mg Oral Daily  . atorvastatin  10 mg Oral Daily  . cefdinir  300 mg Oral Daily  . heparin  5,000 Units Subcutaneous Q8H  . hydrocortisone sod succinate (SOLU-CORTEF) inj  50 mg Intravenous Q8H  . latanoprost  1 drop Both Eyes QHS  . mirtazapine  15 mg Oral QHS   Continuous Infusions:  PRN Meds: acetaminophen **OR** acetaminophen, ondansetron **OR** ondansetron (ZOFRAN) IV   Vital Signs    Vitals:   01/27/18 0408 01/27/18 0813 01/27/18 0927 01/27/18 1142  BP: 112/77 115/71 114/69 111/65  Pulse: 79 71 78 79  Resp: (!) 22  17 20   Temp: 97.8 F (36.6 C) 98.1 F (36.7 C) 98.4 F (36.9 C) 97.6 F (36.4 C)  TempSrc: Oral Oral Oral Oral  SpO2: 94% 90%    Weight:      Height:        Intake/Output Summary (Last 24 hours) at 01/27/2018 1248 Last data filed at 01/27/2018 0345 Gross per 24 hour  Intake 454.18 ml  Output 700 ml  Net -245.82 ml   Filed Weights   01/26/18 1201  Weight: 75.5 kg    Telemetry    Sinus rhythm with PVCs- Personally Reviewed  ECG    No new tracing, ECG from yesterday shows sinus rhythm with nonspecific intraventricular conduction delay- Personally Reviewed  Physical Exam  Looks weak, coughing GEN: No acute distress.   Neck: No JVD Cardiac: RRR, no  diastolic murmurs, 1/6 holosystolic murmur left lower sternal border, no rubs or gallops.  Respiratory: Clear to auscultation bilaterally. GI: Soft, nontender, non-distended  MS: No edema; No deformity. Neuro:  Nonfocal  Psych: Normal affect   Labs    Chemistry Recent Labs  Lab 01/26/18 0307 01/26/18 1303 01/27/18 0708  NA 141 139 143  K 4.4 4.7 4.5  CL 102 105 108  CO2 27 24 18*  GLUCOSE 161* 108* 64*  BUN 24* 22 23  CREATININE 1.93* 1.79* 1.66*  CALCIUM 8.9 7.9* 9.0  PROT  --  4.7*  --   ALBUMIN  --  2.2* 2.2*  AST  --  39  --   ALT  --  38  --   ALKPHOS  --  113  --   BILITOT  --  1.1  --   GFRNONAA 23* 25* 27*  GFRAA 26* 29* 32*  ANIONGAP 12 10 17*     Hematology Recent Labs  Lab 01/26/18 0307 01/26/18 1016 01/27/18 0708  WBC 13.7* 11.9* 12.5*  RBC 5.57* 4.86 4.94  HGB 16.3* 14.3 14.7  HCT 54.9* 48.5* 50.7*  MCV 98.6 99.8 102.6*  MCH 29.3 29.4 29.8  MCHC 29.7* 29.5* 29.0*  RDW 16.6* 16.6* 16.2*  PLT 170 147* 133*    Cardiac Enzymes Recent Labs  Lab 01/26/18 1303 01/26/18 1546 01/27/18 0708  TROPONINI 0.91* 0.88* 0.40*    Recent Labs  Lab 01/26/18 0326  TROPIPOC 0.64*     BNP Recent Labs  Lab 01/26/18 0307  BNP 384.8*     DDimer  Recent Labs  Lab 01/26/18 0307  DDIMER 13.53*     Radiology    Ct Head Wo Contrast  Result Date: 01/26/2018 CLINICAL DATA:  Dizziness with standing. LEFT-sided weakness. History of atrial fibrillation, hypercholesterolemia, sarcoidosis. EXAM: CT HEAD WITHOUT CONTRAST TECHNIQUE: Contiguous axial images were obtained from the base of the skull through the vertex without intravenous contrast. COMPARISON:  MRI head November 23, 2015 FINDINGS: BRAIN: No intraparenchymal hemorrhage, mass effect nor midline shift. Moderate to severe parenchymal brain volume loss. No hydrocephalus. Old small LEFT cerebellar infarct. Patchy supratentorial white matter. No acute large vascular territory infarcts. No abnormal extra-axial  fluid collections. Basal cisterns are patent. 6 mm pineal cyst. VASCULAR: Moderate calcific atherosclerosis of the carotid siphons. SKULL: No skull fracture. Moderate RIGHT temporomandibular osteoarthrosis. Osteopenia. No significant scalp soft tissue swelling. SINUSES/ORBITS: Mild paranasal sinus mucosal thickening. RIGHT frontal sinus osteoma. Mastoid air cells are well aerated.The included ocular globes and orbital contents are non-suspicious. Status post bilateral ocular lens implants. OTHER: None. IMPRESSION: 1. No acute intracranial process. 2. Stable moderate to severe parenchymal brain volume loss. 3. Progressed moderate chronic small vessel ischemic changes. Electronically Signed   By: Elon Alas M.D.   On: 01/26/2018 03:40   Ct Chest Wo Contrast  Result Date: 01/26/2018 CLINICAL DATA:  Fever and hypoxia. Pneumonia, unresolved for complicated EXAM: CT CHEST WITHOUT CONTRAST TECHNIQUE: Multidetector CT imaging of the chest was performed following the standard protocol without IV contrast. COMPARISON:  12/03/2013 FINDINGS: Cardiovascular: No cardiomegaly or pericardial effusion. There is mitral and aortic annular calcification. Degree of aortic valve calcification. Atherosclerosis of the aorta and coronaries. Mediastinum/Nodes: Negative for adenopathy or mass. Lungs/Pleura: Streaky opacity with volume loss in the right perihilar region and right lower lobe, consistent with scarring. This was also seen previously, with accentuation likely from lower lung volumes today. There is a degree of subpleural reticulation also on the left, nonspecific. No honeycombing. No air bronchogram, edema, effusion, or masslike finding Upper Abdomen: No acute finding Musculoskeletal: No acute or aggressive finding. IMPRESSION: 1. Right perihilar atelectasis/scarring. No consolidating pneumonia. 2. Low lung volumes with nonspecific subpleural reticulation. Electronically Signed   By: Monte Fantasia M.D.   On:  01/26/2018 11:30   Nm Pulmonary Vent And Perf (v/q Scan)  Result Date: 01/26/2018 CLINICAL DATA:  Shortness of breath. EXAM: NUCLEAR MEDICINE VENTILATION - PERFUSION LUNG SCAN TECHNIQUE: Ventilation images were obtained in multiple projections using inhaled aerosol Tc-59m DTPA. Perfusion images were obtained in multiple projections after intravenous injection of Tc-46m-MAA. RADIOPHARMACEUTICALS:  Thirty-two mCi of Tc-68m DTPA aerosol inhalation and 4.3 mCi Tc51m-MAA IV COMPARISON:  Chest x-rays dated 01/26/2018 and 10/15/2017 FINDINGS: The patient has chronic elevation of the right hemidiaphragm. The patient swallowed moderate amount of the DTPA. Ventilation: No focal ventilation defect. Perfusion: No wedge shaped peripheral perfusion defects to suggest acute pulmonary embolism. IMPRESSION: Normal ventilation perfusion defect. Electronically Signed   By: Lorriane Shire M.D.   On: 01/26/2018 09:15   Dg Chest Port 1 View  Result Date: 01/27/2018 CLINICAL DATA:  84 year old female with pulmonary edema. History of aortic stenosis and congestive heart failure. EXAM: PORTABLE CHEST 1 VIEW COMPARISON:  Prior chest x-ray and CT scan of the chest 01/26/2018 FINDINGS: Stable cardiac and mediastinal contours. Progressive atelectasis of the right lower lobe  now obscuring the right hemidiaphragm. Stable mild atelectasis versus scarring in the left lower lobe. Biapical pleuroparenchymal scarring. Pulmonary vascular congestion without overt edema. Atherosclerotic calcifications again noted in the transverse aorta. Calcification of the mitral valve annulus. No acute osseous abnormality. IMPRESSION: 1. Progressive right lower lobe atelectasis now resulting in obscuration of the right hemidiaphragm. 2. Mild vascular congestion without overt edema. Electronically Signed   By: Jacqulynn Cadet M.D.   On: 01/27/2018 09:20   Dg Chest Portable 1 View  Result Date: 01/26/2018 CLINICAL DATA:  Fever, hypoxia tonight. EXAM:  PORTABLE CHEST 1 VIEW COMPARISON:  Chest radiograph October 15, 2017 and December 26, 2015 FINDINGS: Increasing elevated RIGHT hemidiaphragm. RIGHT mid lung zone bandlike density overlapping fissure. Mild bronchitic changes. Patchy airspace opacity silhouetting RIGHT pulmonary hilum. Cardiac silhouette is normal size. Calcified aortic arch. Biapical pleural thickening. No pneumothorax. Osteopenia. IMPRESSION: Bronchitic changes with RIGHT atelectasis. Potential RIGHT perihilar pneumonia. Aortic Atherosclerosis (ICD10-I70.0). Electronically Signed   By: Elon Alas M.D.   On: 01/26/2018 03:48    Cardiac Studies   Echo shows an new extensive wall motion abnormality, consider LAD ischemia versus Takotsubo syndrome.  Patient Profile     84 y.o. 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  Assessment & Plan    1. LV Systolic dysfunction/NSTEMI: Abnormalities most likely represent ischemia in the territory of the LAD artery, but she could also have stress cardiomyopathy in the setting of recent sepsis.  Discussed the fact that the only reliable way to distinguish the 2 diagnoses, which are very different meant and prognostic implications, is to do coronary angiography.  She is very concerned about the potential for nephrotoxicity and she has decided that she will never go on dialysis.  She does not want to undergo any contrast based procedures unless they are absolutely necessary.  The alternative is to treat conservatively, repeat echocardiogram in 5-6 days, by  which time there should be improvement if this is stress cardiomyopathy.  Avoid RAAS inhibitors due to advanced chronic kidney disease.  She does not require diuretics at this time since there is no clinical or echo evidence for volume overload.  We will add very low-dose carvedilol. We will start intravenous heparin and plan to continue it until she is ready to have her kyphoplasty and then resume Eliquis after that.  2. Sepsis: improving 3. Paroxysmal Afib: Maintaining sinus rhythm, will start intravenous heparin. 4. HTN: normal BP. 5. HLD: Continue lipitor; LDL ~ goal at 71 with goal <70. 6.CKD IV: she has previously discussed option for hemodialysis with Dr. Lorrene Reid and has decided she will never go on dialysis.  Creatinine today is actually little better than her baseline at 1.66.  Baseline GFR appears to be approximately 20-25. 7. PAH: The current echo shows moderate pulmonary artery hypertension despite no evidence of elevation left heart filling pressures, suggesting underlying chronic pulmonary arteriolar disease, probably exacerbated by the acute pulmonary illness.      For questions or updates, please contact [place] Please consult [email or web address] for contact info under        Signed, Sanda Klein, MD  01/27/2018, 12:48 PM

## 2018-01-27 NOTE — Progress Notes (Signed)
PROGRESS NOTE    SHERRY BLACKARD  VQM:[id number] DOB: 1932-03-28 DOA: 01/26/2018 PCP: [name], MD  Outpatient Specialists:   Brief Narrative:  Patient is an 84 year old Caucasian female, with multiple medical and cardiac problems.  Patient carries diagnoses of paroxysmal atrial fibrillation on chronic anticoagulation (Eliquis) but currently on hold as kyphoplasty is planned, chronic kidney disease stage IV, chronic steroid therapy likely for sarcoidosis (though the patient's family tells me chronic steroid therapy is for chronic cough), diastolic CHF (based on echocardiogram done in August 2017, with EF of 55 to 60%), sarcoidosis and interventricular conduction delay as per prior EKG.  Patient was admitted with weakness and lightheadedness.  Patient has had decreased p.o. intake.  On presentation, patient was hypotensive, with elevated lactic acid level that has now resolved.  Temperature 100.5 F was documented.  Patient is on chronic amiodarone therapy and has had fibrosis noted on previous imaging studies of the lung.  On presentation to the hospital, sepsis likely secondary to pneumonia was entertained.  However, CT chest without contrast was not indicative of pneumonic process.  D-dimer was significantly elevated, but VQ scan is not indicative of pulmonary embolism.  Troponin came back at 0.9.  However, patient denies chest pain or shortness of breath.  Patient continues to appear weak.  Cardiology team has been consulted.  Patient is currently on stress dose steroid.    01/27/2018: Patient looks a bit better today.  No fever or chills.  Patient is coughing up yellowish sputum.  Patient has history of chronic cough.  We will culture the sputum, and will also check procalcitonin level.  Will continue to assess patient and adjust antibiotics accordingly.  Echocardiogram revealed EF of 35 to 40%, wall motion abnormality in the area of LAD, grade 1 diastolic dysfunction and PA peak pressure 59  mmHg.  Cardiology input is appreciated.  Assessment & Plan:   Principal Problem:   Sepsis ([place]) Active Problems:   PAF (paroxysmal atrial fibrillation) (HCC)   CKD (chronic kidney disease), stage IV (HCC)   Essential hypertension   Anemia in chronic renal disease  Sepsis likely from pneumonia: CT chest is not indicative of pneumonia. Discontinue Levaquin. Check procalcitonin level (note that this may be elevated due to other factors) Repeat lactic acid is back to normal.  Cautious hydration for now.  Stress dose steroids for 48 hours.  Continue to monitor and assess patient closely.   No sepsis physiology for now. Hypotension on presentation may have been secondary to volume depletion/ineffective circulatory arterial volume. Patient was also on Lasix prior to presentation. Follow result of echocardiogram. 01/27/2018: Patient is coughing up yellowish sputum.  Patient has history of chronic cough.  No fever or chills. ECHO revealed EF of 35-40% with wall motion abnormalities. ?NSTEMI.  Cardiology is managing.  Likely repeat ECHO in 5 days as per Cardiology.  Heparin drip for now.  Elevated troponin: Possible NSTEMI. Troponin is trending downwards.   Echo findings as above.   Aspirin Heparin drip EKG done earlier revealed intraventricular conduction delay, with no new changes No chest pain or shortness of breath reported.  However, patient was weak on presentation, but may be due to volume depletion Cardiology input is appreciated. Repeat ECHO in 5 days Patient not keen on cardiac cath due to advanced kidney disease Further management will depend on hospital course  Recurrent thoracolumbar compression fracture, likely secondary to severe osteoporosis and chronic steroid use: Kyphoplasty was planned prior to presentation. Patient has had repeated kyphoplasty in  the past. Optimize pain control (back pain) Likely kyphoplasty once patient is optimized.  History of sarcoidosis on  chronic steroid therapy: Patient will need to follow-up with pulmonary team on discharge to address need to continue chronic steroid therapy. Patient has likely severe osteoporosis with a recurrent compression fracture of the thoracolumbar spine. Continue stress dose steroids for now.  Cardiomyopathy, ischemic versus nonischemic: Kindly see echo report. Cardiology team is managing   Weakness, lightheadedness, hypotension and likely ineffective circulatory arterial volume: Hypotension has resolved. We will be careful with IV fluids. Chest x-ray reviewed (likely of fluid fissure, encephalization and elevated right hemidiaphragm) Low threshold to discontinue IV fluids Low threshold to consider IV albumin if blood pressure continues to drop or remains low normal. Monitor closely. Lightheadedness has resolved.  Chronic leukocytosis: This is likely secondary to chronic steroid use. No fever overnight. Patient has chronic cough, and reports yellowish phlegm. Sputum culture Omnicef for possible bronchitis, but will continue to monitor closely.  Elevated d-dimer: VQ scan was not indicative of pulmonary embolism.  Chronic kidney disease stage IV: This is at baseline. Continue to monitor.  Chronic diastolic congestive heart failure: Currently compensated. Lasix is on hold. Manage expectantly.  Chronic paroxysmal atrial fibrillation: Heparin for now Patient has followed up by cardiology. Monitor platelet count.  DVT prophylaxis: Heparin infusion. Code Status: DNR. Family Communication: Patient husband. Disposition Plan:  Will depend on hospital course.  Consultants:   Cardiology  Procedures:   Kyphoplasty is planned when patient is stable  Echo   Antimicrobials:   Levaquin discontinued  Omnicef   Subjective: Reports cough that is productive of yellowish sputum. No fever no chills No chest pain No shortness of breath Weakness is improving.     Objective: Vitals:   01/27/18 0813 01/27/18 0927 01/27/18 1142 01/27/18 1632  BP: 115/71 114/69 111/65 121/71  Pulse: 71 78 79 92  Resp:  17 20 (!) 22  Temp: 98.1 F (36.7 C) 98.4 F (36.9 C) 97.6 F (36.4 C)   TempSrc: Oral Oral Oral Oral  SpO2: 90%   92%  Weight:      Height:        Intake/Output Summary (Last 24 hours) at 01/27/2018 1911 Last data filed at 01/27/2018 0345 Gross per 24 hour  Intake 100 ml  Output 400 ml  Net -300 ml   Filed Weights   01/26/18 1201  Weight: 75.5 kg    Examination:  General exam: Cushingoid facies.   Respiratory system: Clear to auscultation. Respiratory effort normal. Cardiovascular system: S1 & S2.   Gastrointestinal system: Abdomen is obese, soft and nontender.  Organs are difficult to assess.   Central nervous system: Awake and alert.  Patient moves all limbs.    Data Reviewed: I have personally reviewed following labs and imaging studies  CBC: Recent Labs  Lab 01/26/18 0307 01/26/18 1016 01/27/18 0708  WBC 13.7* 11.9* 12.5*  NEUTROABS 11.8* 9.8* 10.1*  HGB 16.3* 14.3 14.7  HCT 54.9* 48.5* 50.7*  MCV 98.6 99.8 102.6*  PLT 170 147* 341*   Basic Metabolic Panel: Recent Labs  Lab 01/26/18 0307 01/26/18 1303 01/27/18 0708  NA 141 139 143  K 4.4 4.7 4.5  CL 102 105 108  CO2 27 24 18*  GLUCOSE 161* 108* 64*  BUN 24* 22 23  CREATININE 1.93* 1.79* 1.66*  CALCIUM 8.9 7.9* 9.0  MG  --   --  2.0  PHOS  --   --  3.9   GFR: Estimated Creatinine  Clearance: 25.1 mL/min (A) (by C-G formula based on SCr of 1.66 mg/dL (H)). Liver Function Tests: Recent Labs  Lab 01/26/18 1303 01/27/18 0708  AST 39  --   ALT 38  --   ALKPHOS 113  --   BILITOT 1.1  --   PROT 4.7*  --   ALBUMIN 2.2* 2.2*   No results for input(s): LIPASE, AMYLASE in the last 168 hours. No results for input(s): AMMONIA in the last 168 hours. Coagulation Profile: Recent Labs  Lab 01/26/18 1016  INR 1.07   Cardiac Enzymes: Recent Labs  Lab  01/26/18 1303 01/26/18 1546 01/27/18 0708  TROPONINI 0.91* 0.88* 0.40*   BNP (last 3 results) No results for input(s): PROBNP in the last 8760 hours. HbA1C: No results for input(s): HGBA1C in the last 72 hours. CBG: Recent Labs  Lab 01/26/18 0316 01/27/18 1130 01/27/18 1240  GLUCAP 166* 73 191*   Lipid Profile: No results for input(s): CHOL, HDL, LDLCALC, TRIG, CHOLHDL, LDLDIRECT in the last 72 hours. Thyroid Function Tests: No results for input(s): TSH, T4TOTAL, FREET4, T3FREE, THYROIDAB in the last 72 hours. Anemia Panel: No results for input(s): VITAMINB12, FOLATE, FERRITIN, TIBC, IRON, RETICCTPCT in the last 72 hours. Urine analysis:    Component Value Date/Time   COLORURINE YELLOW 10/16/2017 2117   APPEARANCEUR CLEAR 10/16/2017 2117   APPEARANCEUR Clear 07/17/2017 1354   LABSPEC 1.011 10/16/2017 2117   PHURINE 5.0 10/16/2017 2117   GLUCOSEU NEGATIVE 10/16/2017 2117   HGBUR MODERATE (A) 10/16/2017 2117   BILIRUBINUR NEGATIVE 10/16/2017 2117   BILIRUBINUR Negative 07/17/2017 1354   KETONESUR NEGATIVE 10/16/2017 2117   PROTEINUR NEGATIVE 10/16/2017 2117   UROBILINOGEN 0.2 02/05/2011 0915   NITRITE NEGATIVE 10/16/2017 2117   LEUKOCYTESUR NEGATIVE 10/16/2017 2117   LEUKOCYTESUR Negative 07/17/2017 1354   Sepsis Labs: @LABRCNTIP (procalcitonin:4,lacticidven:4)  ) Recent Results (from the past 240 hour(s))  Blood Culture (routine x 2)     Status: None (Preliminary result)   Collection Time: 01/26/18  3:55 AM  Result Value Ref Range Status   Specimen Description BLOOD RIGHT ANTECUBITAL  Final   Special Requests   Final    BOTTLES DRAWN AEROBIC AND ANAEROBIC Blood Culture adequate volume   Culture   Final    NO GROWTH 1 DAY Performed at [facility], [place] [address], [place], [place] [address]    Report Status PENDING  Incomplete  Blood Culture (routine x 2)     Status: None (Preliminary result)   Collection Time: 01/26/18  4:03 AM  Result Value Ref Range  Status   Specimen Description BLOOD LEFT FOREARM  Final   Special Requests   Final    BOTTLES DRAWN AEROBIC AND ANAEROBIC Blood Culture results may not be optimal due to an excessive volume of blood received in culture bottles   Culture   Final    NO GROWTH 1 DAY Performed at [facility], [place] [address], [place], [place] [address]    Report Status PENDING  Incomplete  MRSA PCR Screening     Status: None   Collection Time: 01/26/18  5:56 PM  Result Value Ref Range Status   MRSA by PCR NEGATIVE NEGATIVE Final    Comment:        The GeneXpert MRSA Assay (FDA approved for NASAL specimens only), is one component of a comprehensive MRSA colonization surveillance program. It is not intended to diagnose MRSA infection nor to guide or monitor treatment for MRSA infections. Performed at [facility]  [facility], [place] [address] [place], [place] [address]          Radiology Studies: Ct Head Wo Contrast  Result Date: 01/26/2018 CLINICAL DATA:  Dizziness with standing. LEFT-sided weakness. History of atrial fibrillation, hypercholesterolemia, sarcoidosis. EXAM: CT HEAD WITHOUT CONTRAST TECHNIQUE: Contiguous axial images were obtained from the base of the skull through the vertex without intravenous contrast. COMPARISON:  MRI head November 23, 2015 FINDINGS: BRAIN: No intraparenchymal hemorrhage, mass effect nor midline shift. Moderate to severe parenchymal brain volume loss. No hydrocephalus. Old small LEFT cerebellar infarct. Patchy supratentorial white matter. No acute large vascular territory infarcts. No abnormal extra-axial fluid collections. Basal cisterns are patent. 6 mm pineal cyst. VASCULAR: Moderate calcific atherosclerosis of the carotid siphons. SKULL: No skull fracture. Moderate RIGHT temporomandibular osteoarthrosis. Osteopenia. No significant scalp soft tissue swelling. SINUSES/ORBITS: Mild paranasal sinus mucosal thickening. RIGHT frontal sinus osteoma. Mastoid air cells are well  aerated.The included ocular globes and orbital contents are non-suspicious. Status post bilateral ocular lens implants. OTHER: None. IMPRESSION: 1. No acute intracranial process. 2. Stable moderate to severe parenchymal brain volume loss. 3. Progressed moderate chronic small vessel ischemic changes. Electronically Signed   By: Elon Alas M.D.   On: 01/26/2018 03:40   Ct Chest Wo Contrast  Result Date: 01/26/2018 CLINICAL DATA:  Fever and hypoxia. Pneumonia, unresolved for complicated EXAM: CT CHEST WITHOUT CONTRAST TECHNIQUE: Multidetector CT imaging of the chest was performed following the standard protocol without IV contrast. COMPARISON:  12/03/2013 FINDINGS: Cardiovascular: No cardiomegaly or pericardial effusion. There is mitral and aortic annular calcification. Degree of aortic valve calcification. Atherosclerosis of the aorta and coronaries. Mediastinum/Nodes: Negative for adenopathy or mass. Lungs/Pleura: Streaky opacity with volume loss in the right perihilar region and right lower lobe, consistent with scarring. This was also seen previously, with accentuation likely from lower lung volumes today. There is a degree of subpleural reticulation also on the left, nonspecific. No honeycombing. No air bronchogram, edema, effusion, or masslike finding Upper Abdomen: No acute finding Musculoskeletal: No acute or aggressive finding. IMPRESSION: 1. Right perihilar atelectasis/scarring. No consolidating pneumonia. 2. Low lung volumes with nonspecific subpleural reticulation. Electronically Signed   By: Monte Fantasia M.D.   On: 01/26/2018 11:30   Nm Pulmonary Vent And Perf (v/q Scan)  Result Date: 01/26/2018 CLINICAL DATA:  Shortness of breath. EXAM: NUCLEAR MEDICINE VENTILATION - PERFUSION LUNG SCAN TECHNIQUE: Ventilation images were obtained in multiple projections using inhaled aerosol Tc-23m DTPA. Perfusion images were obtained in multiple projections after intravenous injection of Tc-51m-MAA.  RADIOPHARMACEUTICALS:  Thirty-two mCi of Tc-44m DTPA aerosol inhalation and 4.3 mCi Tc7m-MAA IV COMPARISON:  Chest x-rays dated 01/26/2018 and 10/15/2017 FINDINGS: The patient has chronic elevation of the right hemidiaphragm. The patient swallowed moderate amount of the DTPA. Ventilation: No focal ventilation defect. Perfusion: No wedge shaped peripheral perfusion defects to suggest acute pulmonary embolism. IMPRESSION: Normal ventilation perfusion defect. Electronically Signed   By: Lorriane Shire M.D.   On: 01/26/2018 09:15   Dg Chest Port 1 View  Result Date: 01/27/2018 CLINICAL DATA:  84 year old female with pulmonary edema. History of aortic stenosis and congestive heart failure. EXAM: PORTABLE CHEST 1 VIEW COMPARISON:  Prior chest x-ray and CT scan of the chest 01/26/2018 FINDINGS: Stable cardiac and mediastinal contours. Progressive atelectasis of the right lower lobe now obscuring the right hemidiaphragm. Stable mild atelectasis versus scarring in the left lower lobe. Biapical pleuroparenchymal scarring. Pulmonary vascular congestion without overt edema. Atherosclerotic calcifications again noted in the transverse aorta.  Calcification of the mitral valve annulus. No acute osseous abnormality. IMPRESSION: 1. Progressive right lower lobe atelectasis now resulting in obscuration of the right hemidiaphragm. 2. Mild vascular congestion without overt edema. Electronically Signed   By: Jacqulynn Cadet M.D.   On: 01/27/2018 09:20   Dg Chest Portable 1 View  Result Date: 01/26/2018 CLINICAL DATA:  Fever, hypoxia tonight. EXAM: PORTABLE CHEST 1 VIEW COMPARISON:  Chest radiograph October 15, 2017 and December 26, 2015 FINDINGS: Increasing elevated RIGHT hemidiaphragm. RIGHT mid lung zone bandlike density overlapping fissure. Mild bronchitic changes. Patchy airspace opacity silhouetting RIGHT pulmonary hilum. Cardiac silhouette is normal size. Calcified aortic arch. Biapical pleural thickening. No pneumothorax.  Osteopenia. IMPRESSION: Bronchitic changes with RIGHT atelectasis. Potential RIGHT perihilar pneumonia. Aortic Atherosclerosis (ICD10-I70.0). Electronically Signed   By: Elon Alas M.D.   On: 01/26/2018 03:48        Scheduled Meds: . amiodarone  200 mg Oral Daily  . aspirin  325 mg Oral Daily  . atorvastatin  10 mg Oral Daily  . cefdinir  300 mg Oral Daily  . hydrocortisone sod succinate (SOLU-CORTEF) inj  50 mg Intravenous Q8H  . latanoprost  1 drop Both Eyes QHS  . mirtazapine  15 mg Oral QHS   Continuous Infusions: . heparin 1,000 Units/hr (01/27/18 1425)     LOS: 1 day    Time spent: 35 minutes.    Dana Allan, MD  [facility] Pager #: [phone] 7PM-7AM contact night coverage as above

## 2018-01-27 NOTE — Progress Notes (Signed)
  Echocardiogram 2D Echocardiogram has been performed.  Heather Benson 01/27/2018, 11:15 AM

## 2018-01-28 LAB — CBC WITH DIFFERENTIAL/PLATELET
Abs Immature Granulocytes: 0.1 10*3/uL (ref 0.0–0.1)
Abs Immature Granulocytes: 0.1 10*3/uL (ref 0.0–0.1)
Basophils Absolute: 0 10*3/uL (ref 0.0–0.1)
Basophils Absolute: 0 10*3/uL (ref 0.0–0.1)
Basophils Relative: 0 %
Basophils Relative: 0 %
EOS PCT: 0 %
Eosinophils Absolute: 0 10*3/uL (ref 0.0–0.7)
Eosinophils Absolute: 0 10*3/uL (ref 0.0–0.7)
Eosinophils Relative: 0 %
HCT: 43.7 % (ref 36.0–46.0)
HCT: 47 % — ABNORMAL HIGH (ref 36.0–46.0)
Hemoglobin: 13.6 g/dL (ref 12.0–15.0)
Hemoglobin: 14.3 g/dL (ref 12.0–15.0)
Immature Granulocytes: 1 %
Immature Granulocytes: 1 %
LYMPHS ABS: 1.5 10*3/uL (ref 0.7–4.0)
LYMPHS PCT: 12 %
Lymphocytes Relative: 12 %
Lymphs Abs: 1.3 10*3/uL (ref 0.7–4.0)
MCH: 29.9 pg (ref 26.0–34.0)
MCH: 29.2 pg (ref 26.0–34.0)
MCHC: 31.1 g/dL (ref 30.0–36.0)
MCHC: 30.4 g/dL (ref 30.0–36.0)
MCV: 96 fL (ref 78.0–100.0)
MCV: 96.1 fL (ref 78.0–100.0)
MONO ABS: 0.5 10*3/uL (ref 0.1–1.0)
MONOS PCT: 5 %
Monocytes Absolute: 0.6 10*3/uL (ref 0.1–1.0)
Monocytes Relative: 6 %
Neutro Abs: 9.4 10*3/uL — ABNORMAL HIGH (ref 1.7–7.7)
Neutro Abs: 9.8 10*3/uL — ABNORMAL HIGH (ref 1.7–7.7)
Neutrophils Relative %: 81 %
Neutrophils Relative %: 82 %
Platelets: 145 10*3/uL — ABNORMAL LOW (ref 150–400)
Platelets: 171 10*3/uL (ref 150–400)
RBC: 4.55 MIL/uL (ref 3.87–5.11)
RBC: 4.89 MIL/uL (ref 3.87–5.11)
RDW: 16 % — ABNORMAL HIGH (ref 11.5–15.5)
RDW: 16.5 % — ABNORMAL HIGH (ref 11.5–15.5)
WBC: 11.4 10*3/uL — ABNORMAL HIGH (ref 4.0–10.5)
WBC: 11.9 10*3/uL — AB (ref 4.0–10.5)

## 2018-01-28 LAB — RENAL FUNCTION PANEL
Albumin: 2.1 g/dL — ABNORMAL LOW (ref 3.5–5.0)
Anion gap: 11 (ref 5–15)
BUN: 25 mg/dL — ABNORMAL HIGH (ref 8–23)
CO2: 24 mmol/L (ref 22–32)
Calcium: 8.5 mg/dL — ABNORMAL LOW (ref 8.9–10.3)
Chloride: 108 mmol/L (ref 98–111)
Creatinine, Ser: 1.52 mg/dL — ABNORMAL HIGH (ref 0.44–1.00)
GFR calc Af Amer: 35 mL/min — ABNORMAL LOW (ref >60)
GFR calc non Af Amer: 30 mL/min — ABNORMAL LOW (ref >60)
Glucose, Bld: 103 mg/dL — ABNORMAL HIGH (ref 70–99)
Phosphorus: 2.9 mg/dL (ref 2.5–4.6)
Potassium: 4.2 mmol/L (ref 3.5–5.1)
Sodium: 143 mmol/L (ref 135–145)

## 2018-01-28 LAB — GLUCOSE, CAPILLARY
GLUCOSE-CAPILLARY: 99 mg/dL (ref 70–99)
GLUCOSE-CAPILLARY: 155 mg/dL — AB (ref 70–99)
GLUCOSE-CAPILLARY: 142 mg/dL — AB (ref 70–99)
Glucose-Capillary: 113 mg/dL — ABNORMAL HIGH (ref 70–99)
Glucose-Capillary: 116 mg/dL — ABNORMAL HIGH (ref 70–99)
Glucose-Capillary: 145 mg/dL — ABNORMAL HIGH (ref 70–99)

## 2018-01-28 LAB — APTT
aPTT: 141 seconds — ABNORMAL HIGH (ref 24–36)
aPTT: 58 seconds — ABNORMAL HIGH (ref 24–36)

## 2018-01-28 LAB — MAGNESIUM: Magnesium: 2.2 mg/dL (ref 1.7–2.4)

## 2018-01-28 LAB — HEPARIN LEVEL (UNFRACTIONATED): Heparin Unfractionated: 0.8 IU/mL — ABNORMAL HIGH (ref 0.30–0.70)

## 2018-01-28 MED ORDER — SODIUM CHLORIDE 0.9 % IV SOLN
INTRAVENOUS | Status: DC
  Administered 2018-01-28 – 2018-01-31 (×2): via INTRAVENOUS

## 2018-01-28 MED ORDER — CARVEDILOL 3.125 MG PO TABS
3.1250 mg | ORAL_TABLET | Freq: Two times a day (BID) | ORAL | Status: DC
  Administered 2018-01-28 – 2018-02-03 (×12): 3.125 mg via ORAL
  Filled 2018-01-28 (×12): qty 1

## 2018-01-28 MED ORDER — PIPERACILLIN-TAZOBACTAM 3.375 G IVPB
3.3750 g | Freq: Three times a day (TID) | INTRAVENOUS | Status: AC
  Administered 2018-01-28 – 2018-02-02 (×16): 3.375 g via INTRAVENOUS
  Filled 2018-01-28 (×16): qty 50

## 2018-01-28 MED ORDER — HYDROCORTISONE NA SUCCINATE PF 100 MG IJ SOLR
50.0000 mg | Freq: Two times a day (BID) | INTRAMUSCULAR | Status: DC
  Administered 2018-01-29 (×2): 50 mg via INTRAVENOUS
  Filled 2018-01-28 (×3): qty 2

## 2018-01-28 MED ORDER — PIPERACILLIN-TAZOBACTAM 3.375 G IVPB
3.3750 g | Freq: Once | INTRAVENOUS | Status: AC
  Administered 2018-01-28: 3.375 g via INTRAVENOUS
  Filled 2018-01-28: qty 50

## 2018-01-28 NOTE — Progress Notes (Signed)
Nurse called to family's room. Patient found to be upset and crying saying "I'm wasting my families time and that she wants to die, they are just here to show face pretending like they love me, you guys can leave (talking to family)." RN tried to comfort patient and she appeared to be getting more upset. Pt alert to self only at baseline, no time or place or situation.  I notified MD Samtani and a verbal was given for spiritual consult. I will continue to monitor the patient closely.   Saddie Benders RN

## 2018-01-28 NOTE — Progress Notes (Signed)
   01/28/18 1500  Clinical Encounter Type  Visited With Patient and family together  Visit Type Initial  Referral From Nurse  Stress Factors  Patient Stress Factors Health changes  Responded to [facility] consult. Per nurse the patient is sad and just tired. She feel that her family is not really concerned for her. Chaplain listened and shared stories with patient and her family. The patient's husband of 15 years was in the room along with their son and his wife. We laugh and talked and eventually patient joined and smiled. Chaplain provided spiritual and emotional support. Family and patient was appreciative of the visit. Pleasant family!

## 2018-01-28 NOTE — Progress Notes (Addendum)
ROUNDING TEAM 1  NATALY PACIFICO TIW:[id number] DOB: 10/18/1931 DOA: 01/26/2018 PCP: [name], MD   Narrative: 72 WF Paroxysmal atrial fibrillation/AVNRT--prior amiodarone-- on Eliquis [off of this since 9/16]--DCCV 07/2014 dCHF EF 55-60% CKD 4 Chronic steroid use-reflux Recent admission 6/5-6/13-had acute L1 compression fracture, AKI, diverticulitis, shingles and was found that time to havexophytic kidney lesions--- scheduled to have kyphoplasty 9/23  Came to emergency room admitted with elevated troponin on 10/16-infiltrates noted concerning for right-sided pneumonia and atelectasis started empiric antibiotics oxygen  A & Plan Right-sided pneumonia concerning for aspiration-we will get speech therapy to see to help with consistencies CXR 917 shows spread of pneumonia per my read-changing to Zosyn for the time being and may need to slightly more extended course of therapy Add flutter, incentive spirometer, Mucinex CXR, CBC in a.m.  Paroxysmal A. fib/AVNRT chads score >4-prior DCCV attempt-defer to cardiology D CHF EF 55-60 defer to cardiology NSTEMI vs Takotsubo cardiomyopathy-not a cath candidate see below  L1 compression fracture-heparin GTT on board until repeat echo can be done and patient will need to stay in hospital medically until can have kyphoplasty performed 9/23 given high risk of decompensation and stroke from A. Fib----chronic steroid use could be secondary to treatment of fracture and we will wean stress dose steroids in 1 to 2 days  AKI superimposed on chronic kidney disease-creatinine better compared to prior on admission was 24/1.9-patient expresses concern about needing dialysis which she would never wanted in the past-cath has been deferred because of that per cardiologist discussion with me  History of diverticulosis and GI bleed 2018-polypoid thickening of cecum in the ostium of appendix-colonoscopy 8/14 showed polyp rectosigmoid junction felt to be a  diverticular bleed  Exophytic kidney lesions-needs outpatient characterization if stabilized  Moderate to severe malnutrition  Verlon Au, MD  [facility] Direct contact: [phone] --Via amion app OR  --[email or web address]; password [email or web address]  7PM-7AM contact night coverage as above 01/28/2018, 10:09 AM  LOS: 2 days   Consultants:  Cardiology  Procedures:  None  Antimicrobials:  None  Interval history/Subjective: Awake alert no distress trying to eat breakfast but is poorly rotated in the bed-does not endorse chest or back pain at this time  Objective:  Vitals:  Vitals:   01/27/18 2356 01/28/18 0357  BP: 129/84 121/72  Pulse:  78  Resp: 17 18  Temp:  98.7 F (37.1 C)  SpO2: 95% 98%    Exam:  Awake arcus senilis no icterus no pallor neck soft supple no JVD Chest clinically very coarse without rales or rhonchi and significant what sounds to the lungs  S1-S2 irregularly regular Abdomen soft Neurologically intact coherent to make sense Can raise right lower extremity only minimally secondary to a knee surgery Power is 5/5 out of   I have personally reviewed the following:   Labs:  BUN/creatinine down to 25/1.5  Hemoglobin 13 WBC 11.4 platelet 145  Imaging studies:  X-ray noted from 9/17  Medical tests:  Reviewed  Test discussed with performing physician:  Yes discussed with Dr. Orene Desanctis  Decision to obtain old records:  y  Review and summation of old records:  y  Scheduled Meds: . amiodarone  200 mg Oral Daily  . aspirin  325 mg Oral Daily  . atorvastatin  10 mg Oral Daily  . carvedilol  3.125 mg Oral BID WC  . cefdinir  300 mg Oral Daily  . hydrocortisone sod succinate (SOLU-CORTEF) inj  50 mg Intravenous Q8H  . latanoprost  1 drop Both  Eyes QHS  . mirtazapine  15 mg Oral QHS   Continuous Infusions: . heparin 600 Units/hr (01/28/18 0926)    Principal Problem:   Sepsis ([place]) Active Problems:   PAF (paroxysmal atrial  fibrillation) (HCC)   CKD (chronic kidney disease), stage IV ([place])   Essential hypertension   Anemia in chronic renal disease   LOS: 2 days

## 2018-01-28 NOTE — Progress Notes (Signed)
Pharmacy Antibiotic Note  Heather Benson is a 84 y.o. female admitted on 01/26/2018 and now with concern for aspiration pneumonia.  Pharmacy has been consulted for Zosyn dosing.  The patient has an "allergy" listed to Augmentin as nausea/diarrhea which can be classified more as an intolerance and will monitor for this.   Plan: - Start Zosyn 3.375g IV every 8 hours (infused over 4 hours) - Will continue to follow renal function, culture results, LOT, and antibiotic de-escalation plans   Height: 5\' 4"  (162.6 cm) Weight: 166 lb 8 oz (75.5 kg) IBW/kg (Calculated) : 54.7  Temp (24hrs), Avg:98.1 F (36.7 C), Min:97.6 F (36.4 C), Max:98.7 F (37.1 C)  Recent Labs  Lab 01/26/18 0307 01/26/18 0328 01/26/18 0613 01/26/18 1016 01/26/18 1303 01/27/18 0708 01/28/18 0605  WBC 13.7*  --   --  11.9*  --  12.5* 11.4*  CREATININE 1.93*  --   --   --  1.79* 1.66* 1.52*  LATICACIDVEN  --  2.50* 1.03 1.1 1.0  --   --     Estimated Creatinine Clearance: 27.4 mL/min (A) (by C-G formula based on SCr of 1.52 mg/dL (H)).    Allergies  Allergen Reactions  . Augmentin [Amoxicillin-Pot Clavulanate] Diarrhea and Nausea Only  . Boniva [Ibandronic Acid] Diarrhea  . Codeine Other (See Comments)    "Spaced out feeling" and Lightheadedness  . Hydromorphone Nausea And Vomiting  . Latex Rash    "Burns me"  . Lisinopril Cough  . Adhesive [Tape] Itching and Rash    Antimicrobials this admission: Cefdinir 9/16 >> 9/18 Zosyn 9/18 >>  Dose adjustments this admission: n/a  Microbiology results: 9/16 BCx >> ngtd 9/16 MRSA PCR >> neg  Thank you for allowing pharmacy to be a part of this patient's care.  Alycia Rossetti, PharmD, BCPS Clinical Pharmacist Pager: [phone] Clinical phone for 01/28/2018 from 7a-3:30p: [phone] If after 3:30p, please call main pharmacy at: [phone] Please check AMION for all [place] numbers 01/28/2018 11:11 AM

## 2018-01-28 NOTE — Progress Notes (Signed)
[place] for Heparin Indication: Atrial fibrillation  Allergies  Allergen Reactions  . Augmentin [Amoxicillin-Pot Clavulanate] Diarrhea and Nausea Only  . Boniva [Ibandronic Acid] Diarrhea  . Codeine Other (See Comments)    "Spaced out feeling" and Lightheadedness  . Hydromorphone Nausea And Vomiting  . Latex Rash    "Burns me"  . Lisinopril Cough  . Adhesive [Tape] Itching and Rash    Patient Measurements: Height: 5\' 4"  (162.6 cm) Weight: 166 lb 8 oz (75.5 kg) IBW/kg (Calculated) : 54.7 Heparin Dosing Weight: 70.5  Vital Signs: Temp: 97.9 F (36.6 C) (09/18 2034) Temp Source: Oral (09/18 2034) BP: 115/63 (09/18 2034) Pulse Rate: 68 (09/18 2034)  Labs: Recent Labs    01/26/18 1016 01/26/18 1303 01/26/18 1546 01/27/18 0708 01/27/18 2156 01/28/18 0605 01/28/18 1034 01/28/18 2049  HGB 14.3  --   --  14.7  --  13.6 14.3  --   HCT 48.5*  --   --  50.7*  --  43.7 47.0*  --   PLT 147*  --   --  133*  --  145* 171  --   APTT 28  --   --   --  192* 141*  --  58*  LABPROT 13.8  --   --   --   --   --   --   --   INR 1.07  --   --   --   --   --   --   --   HEPARINUNFRC  --   --   --   --  1.40* 0.80*  --   --   CREATININE  --  1.79*  --  1.66*  --  1.52*  --   --   TROPONINI  --  0.91* 0.88* 0.40*  --   --   --   --     Estimated Creatinine Clearance: 27.4 mL/min (A) (by C-G formula based on SCr of 1.52 mg/dL (H)).    Assessment: 84 yo female with PAF on apixaban PTA. Apixaban on hold since 9/13 AM for kyphoplasty. Pharmacy has been consulted to start a heparin drip for NSTEMI and pending kyphoplasty surgery. Baseline aPTT 28.   APTT sub-therapeutic tonight.  No issue with heparin infusion per RN.  No bleeding reported.   Goal of Therapy:  APTT 66-102 sec Heparin level 0.3-0.7 units/ml Monitor platelets by anticoagulation protocol: Yes    Plan:  Increase heparin gtt to 700 units/hr Check 8 hr aPTT   Tomisha Reppucci D. Mina Marble,  PharmD, BCPS, [place] 01/28/2018, 9:47 PM

## 2018-01-28 NOTE — Progress Notes (Signed)
[place] for Heparin Indication: Atrial fibrillation  Allergies  Allergen Reactions  . Augmentin [Amoxicillin-Pot Clavulanate] Diarrhea and Nausea Only  . Boniva [Ibandronic Acid] Diarrhea  . Codeine Other (See Comments)    "Spaced out feeling" and Lightheadedness  . Hydromorphone Nausea And Vomiting  . Latex Rash    "Burns me"  . Lisinopril Cough  . Adhesive [Tape] Itching and Rash    Patient Measurements: Height: 5\' 4"  (162.6 cm) Weight: 166 lb 8 oz (75.5 kg) IBW/kg (Calculated) : 54.7 Heparin Dosing Weight: 70.5  Vital Signs: Temp: 98.7 F (37.1 C) (09/18 0357) Temp Source: Axillary (09/18 0357) BP: 121/72 (09/18 0357) Pulse Rate: 78 (09/18 0357)  Labs: Recent Labs    01/26/18 1016 01/26/18 1303 01/26/18 1546 01/27/18 0708 01/27/18 2156 01/28/18 0605  HGB 14.3  --   --  14.7  --  13.6  HCT 48.5*  --   --  50.7*  --  43.7  PLT 147*  --   --  133*  --  145*  APTT 28  --   --   --  192* 141*  LABPROT 13.8  --   --   --   --   --   INR 1.07  --   --   --   --   --   HEPARINUNFRC  --   --   --   --  1.40* 0.80*  CREATININE  --  1.79*  --  1.66*  --  1.52*  TROPONINI  --  0.91* 0.88* 0.40*  --   --     Estimated Creatinine Clearance: 27.4 mL/min (A) (by C-G formula based on SCr of 1.52 mg/dL (H)).    Assessment: 84 yo female with PAF on apixaban PTA. Apixaban on hold since 9/13 AM for kyphoplasty. Pharmacy has been consulted to start a heparin drip for NSTEMI and pending kyphoplasty surgery. Baseline aPTT 28.   aPTT level this morning remains SUPRAtherapeutic despite a rate decrease earlier today (aPTT 141 << 192, goal of 66-102). Hgb 13.6, plts 145. No bleeding noted at this time.   Goal of Therapy:  APTT 66-102 sec Heparin level 0.3-0.7 units/ml Monitor platelets by anticoagulation protocol: Yes   Plan:  - Reduce Heparin to 600 units/hr - Will continue to monitor for any signs/symptoms of bleeding and will follow  up with aPTT in 8 hours   Thank you for allowing pharmacy to be a part of this patient's care.  Alycia Rossetti, PharmD, BCPS Clinical Pharmacist Pager: [phone] Clinical phone for 01/28/2018 from 7a-3:30p: [phone] If after 3:30p, please call main pharmacy at: [phone] Please check AMION for all [place] numbers 01/28/2018 9:08 AM

## 2018-01-28 NOTE — Progress Notes (Signed)
Progress Note  Patient Name: Heather Benson Date of Encounter: 01/28/2018  Primary Cardiologist: Larae Grooms, MD   Subjective   No cardiovascular complaints overnight.  No dyspnea.  Sleeping comfortably with head of bed at approximately 30 degrees elevation.  Inpatient Medications    Scheduled Meds: . amiodarone  200 mg Oral Daily  . aspirin  325 mg Oral Daily  . atorvastatin  10 mg Oral Daily  . cefdinir  300 mg Oral Daily  . hydrocortisone sod succinate (SOLU-CORTEF) inj  50 mg Intravenous Q8H  . latanoprost  1 drop Both Eyes QHS  . mirtazapine  15 mg Oral QHS   Continuous Infusions: . heparin 600 Units/hr (01/28/18 0926)   PRN Meds: acetaminophen **OR** acetaminophen, ondansetron **OR** ondansetron (ZOFRAN) IV   Vital Signs    Vitals:   01/27/18 1632 01/27/18 2045 01/27/18 2356 01/28/18 0357  BP: 121/71 114/75 129/84 121/72  Pulse: 92 80  78  Resp: (!) 22 (!) 24 17 18   Temp:  98.1 F (36.7 C)  98.7 F (37.1 C)  TempSrc: Oral Oral  Axillary  SpO2: 92% 93% 95% 98%  Weight:    75.5 kg  Height:        Intake/Output Summary (Last 24 hours) at 01/28/2018 0940 Last data filed at 01/28/2018 0600 Gross per 24 hour  Intake 174.09 ml  Output -  Net 174.09 ml   Filed Weights   01/26/18 1201 01/28/18 0357  Weight: 75.5 kg 75.5 kg    Telemetry    Sinus rhythm- Personally Reviewed  ECG    No new tracing- Personally Reviewed  Physical Exam  Appears frail and weak, elderly, several skin ecchymoses GEN: No acute distress.   Neck: No JVD Cardiac: RRR, right upper sternal border early peaking systolic ejection murmur, holosystolic left lower sternal border murmur tricuspid regurgitation, no diastolic murmurs, rubs, or gallops.  Respiratory: Clear to auscultation bilaterally. GI: Soft, nontender, non-distended  MS: No edema; No deformity. Neuro:  Nonfocal  Psych: Normal affect   Labs    Chemistry Recent Labs  Lab 01/26/18 1303 01/27/18 0708  01/28/18 0605  NA 139 143 143  K 4.7 4.5 4.2  CL 105 108 108  CO2 24 18* 24  GLUCOSE 108* 64* 103*  BUN 22 23 25*  CREATININE 1.79* 1.66* 1.52*  CALCIUM 7.9* 9.0 8.5*  PROT 4.7*  --   --   ALBUMIN 2.2* 2.2* 2.1*  AST 39  --   --   ALT 38  --   --   ALKPHOS 113  --   --   BILITOT 1.1  --   --   GFRNONAA 25* 27* 30*  GFRAA 29* 32* 35*  ANIONGAP 10 17* 11     Hematology Recent Labs  Lab 01/26/18 1016 01/27/18 0708 01/28/18 0605  WBC 11.9* 12.5* 11.4*  RBC 4.86 4.94 4.55  HGB 14.3 14.7 13.6  HCT 48.5* 50.7* 43.7  MCV 99.8 102.6* 96.0  MCH 29.4 29.8 29.9  MCHC 29.5* 29.0* 31.1  RDW 16.6* 16.2* 16.0*  PLT 147* 133* 145*    Cardiac Enzymes Recent Labs  Lab 01/26/18 1303 01/26/18 1546 01/27/18 0708  TROPONINI 0.91* 0.88* 0.40*    Recent Labs  Lab 01/26/18 0326  TROPIPOC 0.64*     BNP Recent Labs  Lab 01/26/18 0307  BNP 384.8*     DDimer  Recent Labs  Lab 01/26/18 0307  DDIMER 13.53*     Radiology    Ct Chest  Wo Contrast  Result Date: 01/26/2018 CLINICAL DATA:  Fever and hypoxia. Pneumonia, unresolved for complicated EXAM: CT CHEST WITHOUT CONTRAST TECHNIQUE: Multidetector CT imaging of the chest was performed following the standard protocol without IV contrast. COMPARISON:  12/03/2013 FINDINGS: Cardiovascular: No cardiomegaly or pericardial effusion. There is mitral and aortic annular calcification. Degree of aortic valve calcification. Atherosclerosis of the aorta and coronaries. Mediastinum/Nodes: Negative for adenopathy or mass. Lungs/Pleura: Streaky opacity with volume loss in the right perihilar region and right lower lobe, consistent with scarring. This was also seen previously, with accentuation likely from lower lung volumes today. There is a degree of subpleural reticulation also on the left, nonspecific. No honeycombing. No air bronchogram, edema, effusion, or masslike finding Upper Abdomen: No acute finding Musculoskeletal: No acute or  aggressive finding. IMPRESSION: 1. Right perihilar atelectasis/scarring. No consolidating pneumonia. 2. Low lung volumes with nonspecific subpleural reticulation. Electronically Signed   By: Monte Fantasia M.D.   On: 01/26/2018 11:30   Dg Chest Port 1 View  Result Date: 01/27/2018 CLINICAL DATA:  84 year old female with pulmonary edema. History of aortic stenosis and congestive heart failure. EXAM: PORTABLE CHEST 1 VIEW COMPARISON:  Prior chest x-ray and CT scan of the chest 01/26/2018 FINDINGS: Stable cardiac and mediastinal contours. Progressive atelectasis of the right lower lobe now obscuring the right hemidiaphragm. Stable mild atelectasis versus scarring in the left lower lobe. Biapical pleuroparenchymal scarring. Pulmonary vascular congestion without overt edema. Atherosclerotic calcifications again noted in the transverse aorta. Calcification of the mitral valve annulus. No acute osseous abnormality. IMPRESSION: 1. Progressive right lower lobe atelectasis now resulting in obscuration of the right hemidiaphragm. 2. Mild vascular congestion without overt edema. Electronically Signed   By: Jacqulynn Cadet M.D.   On: 01/27/2018 09:20    Cardiac Studies   - Left ventricle: The cavity size was normal. Wall thickness was   normal. Systolic function was moderately reduced. The estimated   ejection fraction was in the range of 35% to 40%. Severe   hypokinesis of the mid-apicalanteroseptal, anterior, inferior,   and apical myocardium; consistent with ischemia in the   distribution of the left anterior descending coronary artery; new   since the study of 12/14/2015. Doppler parameters are consistent   with abnormal left ventricular relaxation (grade 1 diastolic   dysfunction). - Aortic valve: Right coronary cusp mobility was moderately   restricted. There was mild stenosis. There was mild regurgitation   directed centrally in the LVOT. Valve area (VTI): 1.55 cm^2.   Valve area (Vmax): 1.38 cm^2.  Valve area (Vmean): 1.48 cm^2. - Mitral valve: Moderately calcified annulus. There was mild to   moderate regurgitation directed centrally. - Left atrium: The atrium was moderately to severely dilated. - Right atrium: The atrium was mildly dilated. - Tricuspid valve: There was moderate regurgitation directed   centrally. - Pulmonary arteries: Systolic pressure was moderately increased.   PA peak pressure: 59 mm Hg (S).  Impressions:  - New extensive anteroapical wall motion abnormality. Consider LAD   ischemia versus stress cardiomyopathy (takotsubo syndrome).  Patient Profile     84 y.o. 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  Assessment &  Plan    1. LV Systolic dysfunction/NSTEMI:  Differential diagnosis includes LAD artery ischemia versus Takotsubo syndrome.  She does not want to undergo coronary angiography due to the risk of nephrotoxicity.  Plan to treat conservatively, repeat echocardiogram in 5-6 days, by which time there should be improvement if this is stress cardiomyopathy.  Avoid RAAS inhibitors due to advanced chronic kidney disease.  At this point appears euvolemic, but she is at risk for developing congestive heart failure. We will start intravenous heparin and plan to continue it until she is ready to have her kyphoplasty and then resume Eliquis after that.  2. Sepsis: improving 3. Paroxysmal Afib: Maintaining sinus rhythm, on intravenous heparin. 4. HTN: normal BP. 5. HLD: Continue lipitor; LDL ~ goal at 71 with goal  <70. 6.CKD IV: she has previously discussed option for hemodialysis with Dr. Lorrene Reid and has decided she will never go on dialysis.  Creatinine today is even better than her baseline at 1.52.  Baseline GFR appears to be approximately 20-25. 7. PAH: The current echo shows moderate pulmonary artery hypertension despite no evidence of elevation left heart filling pressures, suggesting underlying chronic pulmonary arteriolar disease, probably exacerbated by the acute pulmonary illness.      For questions or updates, please contact [place] Please consult [email or web address] for contact info under        Signed, Sanda Klein, MD  01/28/2018, 9:40 AM

## 2018-01-28 NOTE — Progress Notes (Signed)
[facility] for Heparin Indication: Atrial fibrillation  Allergies  Allergen Reactions  . Augmentin [Amoxicillin-Pot Clavulanate] Diarrhea and Nausea Only  . Boniva [Ibandronic Acid] Diarrhea  . Codeine Other (See Comments)    "Spaced out feeling" and Lightheadedness  . Hydromorphone Nausea And Vomiting  . Latex Rash    "Burns me"  . Lisinopril Cough  . Adhesive [Tape] Itching and Rash    Patient Measurements: Height: 5\' 4"  (162.6 cm) Weight: 166 lb 8 oz (75.5 kg) IBW/kg (Calculated) : 54.7 Heparin Dosing Weight: 70.5  Vital Signs: Temp: 98.1 F (36.7 C) (09/17 2045) Temp Source: Oral (09/17 2045) BP: 114/75 (09/17 2045) Pulse Rate: 80 (09/17 2045)  Labs: Recent Labs    01/26/18 0307 01/26/18 1016 01/26/18 1303 01/26/18 1546 01/27/18 0708 01/27/18 2156  HGB 16.3* 14.3  --   --  14.7  --   HCT 54.9* 48.5*  --   --  50.7*  --   PLT 170 147*  --   --  133*  --   APTT  --  28  --   --   --  192*  LABPROT  --  13.8  --   --   --   --   INR  --  1.07  --   --   --   --   HEPARINUNFRC  --   --   --   --   --  1.40*  CREATININE 1.93*  --  1.79*  --  1.66*  --   TROPONINI  --   --  0.91* 0.88* 0.40*  --     Estimated Creatinine Clearance: 25.1 mL/min (A) (by C-G formula based on SCr of 1.66 mg/dL (H)).    Assessment: 84 yo female with PAF on apixaban PTA. Apixaban on hold since 9/13 AM for kyphoplasty. Pharmacy has been consulted to start a heparin drip pending surgery. Baseline aPTT 28.   APTT 192sec, HL 1.4 units/ml  Goal of Therapy:  APTT 66-102 sec Heparin level 0.3-0.7 units/ml Monitor platelets by anticoagulation protocol: Yes   Plan:  Hold heparin for 1 hour Restart Heparin drip at 800 units/hr Heparin level and aPTT in 6 hours Daily Heparin level and CBC while on heparin drip.  Monitor for s/sx of bleeding  Thanks for allowing pharmacy to be a part of this patient's care.  Excell Seltzer, PharmD Clinical  Pharmacist 01/28/2018,12:00 AM

## 2018-01-29 ENCOUNTER — Inpatient Hospital Stay (HOSPITAL_COMMUNITY): Payer: Medicare Other

## 2018-01-29 ENCOUNTER — Other Ambulatory Visit (HOSPITAL_COMMUNITY): Payer: Medicare Other

## 2018-01-29 LAB — CBC
HEMATOCRIT: 45.2 % (ref 36.0–46.0)
HEMOGLOBIN: 13.7 g/dL (ref 12.0–15.0)
MCH: 29.5 pg (ref 26.0–34.0)
MCHC: 30.3 g/dL (ref 30.0–36.0)
MCV: 97.4 fL (ref 78.0–100.0)
Platelets: 153 10*3/uL (ref 150–400)
RBC: 4.64 MIL/uL (ref 3.87–5.11)
RDW: 16.4 % — ABNORMAL HIGH (ref 11.5–15.5)
WBC: 8.7 10*3/uL (ref 4.0–10.5)

## 2018-01-29 LAB — COMPREHENSIVE METABOLIC PANEL
ALT: 30 U/L (ref 0–44)
AST: 28 U/L (ref 15–41)
Albumin: 2.1 g/dL — ABNORMAL LOW (ref 3.5–5.0)
Alkaline Phosphatase: 103 U/L (ref 38–126)
Anion gap: 6 (ref 5–15)
BUN: 31 mg/dL — ABNORMAL HIGH (ref 8–23)
CHLORIDE: 108 mmol/L (ref 98–111)
CO2: 30 mmol/L (ref 22–32)
Calcium: 8.6 mg/dL — ABNORMAL LOW (ref 8.9–10.3)
Creatinine, Ser: 1.72 mg/dL — ABNORMAL HIGH (ref 0.44–1.00)
GFR, EST AFRICAN AMERICAN: 30 mL/min — AB (ref >60)
GFR, EST NON AFRICAN AMERICAN: 26 mL/min — AB (ref >60)
Glucose, Bld: 106 mg/dL — ABNORMAL HIGH (ref 70–99)
POTASSIUM: 3.9 mmol/L (ref 3.5–5.1)
SODIUM: 144 mmol/L (ref 135–145)
Total Bilirubin: 0.8 mg/dL (ref 0.3–1.2)
Total Protein: 4.6 g/dL — ABNORMAL LOW (ref 6.5–8.1)

## 2018-01-29 LAB — GLUCOSE, CAPILLARY
GLUCOSE-CAPILLARY: 111 mg/dL — AB (ref 70–99)
GLUCOSE-CAPILLARY: 119 mg/dL — AB (ref 70–99)
GLUCOSE-CAPILLARY: 140 mg/dL — AB (ref 70–99)
GLUCOSE-CAPILLARY: 144 mg/dL — AB (ref 70–99)
GLUCOSE-CAPILLARY: 98 mg/dL (ref 70–99)
Glucose-Capillary: 103 mg/dL — ABNORMAL HIGH (ref 70–99)

## 2018-01-29 LAB — APTT: aPTT: 72 seconds — ABNORMAL HIGH (ref 24–36)

## 2018-01-29 LAB — HEPARIN LEVEL (UNFRACTIONATED): Heparin Unfractionated: 0.41 IU/mL (ref 0.30–0.70)

## 2018-01-29 MED ORDER — MEGESTROL ACETATE 20 MG PO TABS
20.0000 mg | ORAL_TABLET | Freq: Three times a day (TID) | ORAL | Status: DC
  Filled 2018-01-29: qty 1

## 2018-01-29 MED ORDER — HYDROCORTISONE NA SUCCINATE PF 100 MG IJ SOLR
50.0000 mg | INTRAMUSCULAR | Status: DC
  Administered 2018-01-30 – 2018-02-01 (×3): 50 mg via INTRAVENOUS
  Filled 2018-01-29 (×3): qty 2

## 2018-01-29 NOTE — Progress Notes (Addendum)
Progress Note  Patient Name: Heather Benson Date of Encounter: 01/29/2018  Primary Cardiologist: Larae Grooms, MD  Subjective   Notes outline that patient feels family is unconcerned and just wants to leave her in the hospital (see nurse/chaplain note from yesterday). Per nursing, family is very attentive. Patient and husband were discussing this when I walked in. Pt denies any acute cardiac complaints - no CP, SOB, edema, palpitations.  Inpatient Medications    Scheduled Meds: . amiodarone  200 mg Oral Daily  . aspirin  325 mg Oral Daily  . atorvastatin  10 mg Oral Daily  . carvedilol  3.125 mg Oral BID WC  . hydrocortisone sod succinate (SOLU-CORTEF) inj  50 mg Intravenous Q12H  . latanoprost  1 drop Both Eyes QHS  . mirtazapine  15 mg Oral QHS   Continuous Infusions: . sodium chloride 10 mL/hr at 01/29/18 0200  . heparin 700 Units/hr (01/28/18 2345)  . piperacillin-tazobactam (ZOSYN)  IV 3.375 g (01/29/18 0522)   PRN Meds: acetaminophen **OR** acetaminophen, ondansetron **OR** ondansetron (ZOFRAN) IV   Vital Signs    Vitals:   01/28/18 2034 01/29/18 0034 01/29/18 0450 01/29/18 0801  BP: 115/63 104/70 118/65 (!) 130/118  Pulse: 68 70 66 67  Resp: (!) 23 20 20 20   Temp: 97.9 F (36.6 C) 98.3 F (36.8 C) 97.6 F (36.4 C) 97.7 F (36.5 C)  TempSrc: Oral Oral Axillary Axillary  SpO2: 98% 97% 99% 99%  Weight:   74.3 kg   Height:        Intake/Output Summary (Last 24 hours) at 01/29/2018 0914 Last data filed at 01/29/2018 0530 Gross per 24 hour  Intake 83.72 ml  Output 450 ml  Net -366.28 ml   Filed Weights   01/26/18 1201 01/28/18 0357 01/29/18 0450  Weight: 75.5 kg 75.5 kg 74.3 kg    Telemetry    NSR - Personally Reviewed  Physical Exam   GEN: No acute distress.  HEENT: Normocephalic, atraumatic, sclera are injected in inner corner L eye Neck: No JVD or bruits. Cardiac: RRR no murmurs, rubs, or gallops.  Radials/DP/PT 1+ and equal  bilaterally.  Respiratory: Clear to auscultation bilaterally. Breathing is unlabored. GI: Soft, nontender, non-distended, BS +x 4. MS: no deformity. Extremities: No clubbing or cyanosis. No edema. Distal pedal pulses are 2+ and equal bilaterally. Neuro:  AAOx3. Follows commands. Psych:  Responds to questions appropriately with a normal affect.  Labs    Chemistry Recent Labs  Lab 01/26/18 1303 01/27/18 0708 01/28/18 0605 01/29/18 0458  NA 139 143 143 144  K 4.7 4.5 4.2 3.9  CL 105 108 108 108  CO2 24 18* 24 30  GLUCOSE 108* 64* 103* 106*  BUN 22 23 25* 31*  CREATININE 1.79* 1.66* 1.52* 1.72*  CALCIUM 7.9* 9.0 8.5* 8.6*  PROT 4.7*  --   --  4.6*  ALBUMIN 2.2* 2.2* 2.1* 2.1*  AST 39  --   --  28  ALT 38  --   --  30  ALKPHOS 113  --   --  103  BILITOT 1.1  --   --  0.8  GFRNONAA 25* 27* 30* 26*  GFRAA 29* 32* 35* 30*  ANIONGAP 10 17* 11 6     Hematology Recent Labs  Lab 01/28/18 0605 01/28/18 1034 01/29/18 0458  WBC 11.4* 11.9* 8.7  RBC 4.55 4.89 4.64  HGB 13.6 14.3 13.7  HCT 43.7 47.0* 45.2  MCV 96.0 96.1 97.4  MCH 29.9  29.2 29.5  MCHC 31.1 30.4 30.3  RDW 16.0* 16.5* 16.4*  PLT 145* 171 153    Cardiac Enzymes Recent Labs  Lab 01/26/18 1303 01/26/18 1546 01/27/18 0708  TROPONINI 0.91* 0.88* 0.40*    Recent Labs  Lab 01/26/18 0326  TROPIPOC 0.64*     BNP Recent Labs  Lab 01/26/18 0307  BNP 384.8*     DDimer  Recent Labs  Lab 01/26/18 0307  DDIMER 13.53*     Radiology    No results found.  Cardiac Studies   - Left ventricle: The cavity size was normal. Wall thickness was normal. Systolic function was moderately reduced. The estimated ejection fraction was in the range of 35% to 40%. Severe hypokinesis of the mid-apicalanteroseptal, anterior, inferior, and apical myocardium; consistent with ischemia in the distribution of the left anterior descending coronary artery; new since the study of 12/14/2015. Doppler  parameters are consistent with abnormal left ventricular relaxation (grade 1 diastolic dysfunction). - Aortic valve: Right coronary cusp mobility was moderately restricted. There was mild stenosis. There was mild regurgitation directed centrally in the LVOT. Valve area (VTI): 1.55 cm^2. Valve area (Vmax): 1.38 cm^2. Valve area (Vmean): 1.48 cm^2. - Mitral valve: Moderately calcified annulus. There was mild to moderate regurgitation directed centrally. - Left atrium: The atrium was moderately to severely dilated. - Right atrium: The atrium was mildly dilated. - Tricuspid valve: There was moderate regurgitation directed centrally. - Pulmonary arteries: Systolic pressure was moderately increased. PA peak pressure: 59 mm Hg (S).  Impressions:  - New extensive anteroapical wall motion abnormality. Consider LAD ischemia versus stress cardiomyopathy (takotsubo syndrome).  Patient Profile     84 y.o. 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 She was initially felt to have syncope but notes clarified this was instead leg weakness.  Assessment & Plan    1. LV Systolic dysfunction/NSTEMI: Differential diagnosis includes LAD artery ischemia versus Takotsubo syndrome.  She does not want to undergo coronary angiography due to the risk ofnephrotoxicity. We are avoiding RAAS inhibitors and spironolactone due to advanced chronic kidney disease.  At this point appears euvolemic, but she is at risk for developing congestive heart failure. Continue daily weights. She will remain no  IV heparin until kyphoplasty with plan to resume Eliquis thereafter. Plan to treat conservatively and f/u repeat echo this weekend bywhich time there should be improvement ifthis is stress cardiomyopathy. Will place order with comments to repeat echo on 9/21 given that kyphoplasty is anticipated for 9/23 per IM notes.  She is currently also on full dose ASA - will review with MD. From a registry standpoint we need to document why not using Plavix as well - suspect this is not planned given her concomitant Eliquis use.   2. Sepsis with PNA concerning for aspiration:improving  3.Paroxysmal Afib:Maintaining sinus rhythm, on intravenous heparin and amiodarone. Check thyroid function with next labs.  4.HTN: normal BP.  5.HLD:Continue lipitor;LDL ~ goal at 16 with goal <70.  6.CKD IV: she has previously discussed option for hemodialysis with Dr. Lorrene Reid and has decided she will never go on dialysis. Baseline Cr appears variable, but somewhere in realm of 1.9-2.2. Mild uptrend since yesterday to 1.72 but overall  within baseline.  7. PAH:The current echo shows moderate pulmonary artery hypertension despite no evidence of elevation left heart filling pressures, suggesting underlying chronic pulmonaryarteriolar disease, probably exacerbated by the acute pulmonary illness.  8. Social issues: patient has been upset the last 2 days as she feels family does not care about her. It sounds like family has been present and attentive during her stay. Will defer to primary team.  For questions or updates, please contact [place] Please consult [email or web address] for contact info under Cardiology/STEMI.  Signed, Charlie Pitter, PA-C 01/29/2018, 9:14 AM    I have seen and examined the patient along with Charlie Pitter, PA-C.  I have reviewed the chart, notes and new data.  I agree with PA/NP's note.  Key new complaints: No angina, no worsening of her breathing. Key examination changes: No overt findings  of hypervolemia Key new findings / data: When I have been witness to the family interactions, I have found the patient's husband to be very attentive.  PLAN: Unclear whether she had non-STEMI (with little permanent damage but a large area of myocardium in jeopardy) versus Takotsubo syndrome.  Decrease aspirin to 81 mg once daily.  Plan to restart Eliquis if LV wall motion/EF are normal (or tending back to normal) on follow-up echo in a couple of days.  Sanda Klein, MD, [place] [phone] 01/29/2018, 9:50 AM

## 2018-01-29 NOTE — Progress Notes (Addendum)
ROUNDING TEAM 1  JAMESE TRAUGER QMG:[id number] DOB: Jan 02, 1932 DOA: 01/26/2018 PCP: [name], MD   Narrative: 89 WF Paroxysmal atrial fibrillation/AVNRT--prior amiodarone-- on Eliquis [off of this since 9/16]--DCCV 07/2014 dCHF EF 55-60% CKD 4 Chronic steroid use-reflux Recent admission 6/5-6/13-had acute L1 compression fracture, AKI, diverticulitis, shingles and was found that time to havexophytic kidney lesions--- scheduled to have kyphoplasty 9/23  Came to emergency room admitted with elevated troponin on 10/16-infiltrates noted concerning for right-sided pneumonia and atelectasis started empiric antibiotics oxygen  A & Plan  Right-sided pneumonia concerning for aspiration-SLP input in am CXR 917 shows spread of pneumonia per my read-cont Zosyn started 9/19--prior on cefdinir Add flutter, incentive spirometer, Mucinex CXR, CBC in a.m.  Paroxysmal A. fib/AVNRT chads score >4-prior DCCV attempt-defer to cardiology D CHF EF 55-60 defer to cardiology NSTEMI vs Takotsubo cardiomyopathy-not a cath candidate see below  L1 compression fracture-heparin GTT on board until repeat echo per cardiology-patient to stay in hospital medically until kyphoplasty  9/23  [high risk of decompensation and stroke from A. Fib]- weaning stress dose steroids  AKI superimposed on chronic kidney disease-creatinine better compared to prior on admission was 24/1.9-patient expresses concern about needing dialysis which she would never wanted in the past-cath has been deferred because of that per cardiologist discussion with me  History of diverticulosis and GI bleed 2018-polypoid thickening of cecum in the ostium of appendix-colonoscopy 8/14 showed polyp rectosigmoid junction felt to be a diverticular bleed  Exophytic kidney lesions-needs outpatient characterization if stabilized  Moderate to severe malnutrition  Verlon Au, MD  [facility] Direct contact: [phone] --Via amion app  OR  --[email or web address]; password [email or web address]  7PM-7AM contact night coverage as above 01/29/2018, 3:03 PM  LOS: 3 days   Consultants:  Cardiology  Procedures:  None  Antimicrobials:  None  Interval history/Subjective:  Emotional today Eating and drinking No distress  Objective:  Vitals:  Vitals:   01/29/18 0801 01/29/18 1154  BP: (!) 130/118 101/60  Pulse: 67 78  Resp: 20 20  Temp: 97.7 F (36.5 C) 98.1 F (36.7 C)  SpO2: 99% 96%    Exam:  Awake arcus senilis no JVD Chest clinically very coarse still  S1-S2 irregularly regular Abdomen soft Neurologically intact coherent to make sense Power is 5/5    I have personally reviewed the following:   Labs:  BUN/creatinine down to 25/1.5-->31/1.72  Hemoglobin 13 WBC 11.4 platelet 145  Imaging studies:  X-ray noted from 9/17  Medical tests:  Reviewed  Test discussed with performing physician:  n  Decision to obtain old records:  y  Review and summation of old records:  y  Scheduled Meds: . amiodarone  200 mg Oral Daily  . aspirin  325 mg Oral Daily  . atorvastatin  10 mg Oral Daily  . carvedilol  3.125 mg Oral BID WC  . hydrocortisone sod succinate (SOLU-CORTEF) inj  50 mg Intravenous Q12H  . latanoprost  1 drop Both Eyes QHS  . mirtazapine  15 mg Oral QHS   Continuous Infusions: . sodium chloride 10 mL/hr at 01/29/18 0200  . heparin 700 Units/hr (01/28/18 2345)  . piperacillin-tazobactam (ZOSYN)  IV 3.375 g (01/29/18 1441)    Principal Problem:   Sepsis ([place]) Active Problems:   PAF (paroxysmal atrial fibrillation) (HCC)   CKD (chronic kidney disease), stage IV (HCC)   Essential hypertension   Anemia in chronic renal disease   LOS: 3 days

## 2018-01-29 NOTE — Progress Notes (Signed)
[facility] [place] for Heparin Indication: Atrial fibrillation  Allergies  Allergen Reactions  . Augmentin [Amoxicillin-Pot Clavulanate] Diarrhea and Nausea Only  . Boniva [Ibandronic Acid] Diarrhea  . Codeine Other (See Comments)    "Spaced out feeling" and Lightheadedness  . Hydromorphone Nausea And Vomiting  . Latex Rash    "Burns me"  . Lisinopril Cough  . Adhesive [Tape] Itching and Rash    Patient Measurements: Height: 5\' 4"  (162.6 cm) Weight: 163 lb 12.8 oz (74.3 kg) IBW/kg (Calculated) : 54.7 Heparin Dosing Weight: 70.5  Vital Signs: Temp: 97.7 F (36.5 C) (09/19 0801) Temp Source: Axillary (09/19 0801) BP: 130/118 (09/19 0801) Pulse Rate: 67 (09/19 0801)  Labs: Recent Labs    01/26/18 1016  01/26/18 1303 01/26/18 1546 01/27/18 0708 01/27/18 2156 01/28/18 0605 01/28/18 1034 01/28/18 2049 01/29/18 0458  HGB 14.3  --   --   --  14.7  --  13.6 14.3  --  13.7  HCT 48.5*  --   --   --  50.7*  --  43.7 47.0*  --  45.2  PLT 147*  --   --   --  133*  --  145* 171  --  153  APTT 28  --   --   --   --  192* 141*  --  58* 72*  LABPROT 13.8  --   --   --   --   --   --   --   --   --   INR 1.07  --   --   --   --   --   --   --   --   --   HEPARINUNFRC  --   --   --   --   --  1.40* 0.80*  --   --  0.41  CREATININE  --    < > 1.79*  --  1.66*  --  1.52*  --   --  1.72*  TROPONINI  --   --  0.91* 0.88* 0.40*  --   --   --   --   --    < > = values in this interval not displayed.    Estimated Creatinine Clearance: 24 mL/min (A) (by C-G formula based on SCr of 1.72 mg/dL (H)).    Assessment: 84 yo female with PAF on apixaban PTA. Apixaban on hold since 9/13 AM for kyphoplasty on 9/23. Pharmacy has been consulted to start a heparin drip for NSTEMI and pending kyphoplasty surgery. -heparin level= 0.41, aPTT= 72 (beginning to correlate)  Goal of Therapy:  APTT 66-102 sec Heparin level 0.3-0.7 units/ml Monitor platelets by  anticoagulation protocol: Yes    Plan:  -No heparin changes needed -Heparin level, aPTT and CBC in am -Will likely d/c daily aPTT in am  Hildred Laser, PharmD Clinical Pharmacist Please check Amion for pharmacy contact number

## 2018-01-29 NOTE — Care Management Important Message (Signed)
Important Message  Patient Details  Name: Heather Benson MRN: [id number] Date of Birth: 02-17-1932   Medicare Important Message Given:  Yes    Oryan Winterton Montine Circle 01/29/2018, 3:16 PM

## 2018-01-30 ENCOUNTER — Inpatient Hospital Stay (HOSPITAL_COMMUNITY): Payer: Medicare Other

## 2018-01-30 DIAGNOSIS — J189 Pneumonia, unspecified organism: Secondary | ICD-10-CM

## 2018-01-30 DIAGNOSIS — I214 Non-ST elevation (NSTEMI) myocardial infarction: Secondary | ICD-10-CM

## 2018-01-30 DIAGNOSIS — S32030A Wedge compression fracture of third lumbar vertebra, initial encounter for closed fracture: Secondary | ICD-10-CM

## 2018-01-30 LAB — GLUCOSE, CAPILLARY
GLUCOSE-CAPILLARY: 112 mg/dL — AB (ref 70–99)
GLUCOSE-CAPILLARY: 121 mg/dL — AB (ref 70–99)
Glucose-Capillary: 103 mg/dL — ABNORMAL HIGH (ref 70–99)
Glucose-Capillary: 124 mg/dL — ABNORMAL HIGH (ref 70–99)
Glucose-Capillary: 68 mg/dL — ABNORMAL LOW (ref 70–99)
Glucose-Capillary: 85 mg/dL (ref 70–99)

## 2018-01-30 LAB — RENAL FUNCTION PANEL
Albumin: 2.1 g/dL — ABNORMAL LOW (ref 3.5–5.0)
Anion gap: 10 (ref 5–15)
BUN: 29 mg/dL — ABNORMAL HIGH (ref 8–23)
CO2: 28 mmol/L (ref 22–32)
Calcium: 8.7 mg/dL — ABNORMAL LOW (ref 8.9–10.3)
Chloride: 109 mmol/L (ref 98–111)
Creatinine, Ser: 1.68 mg/dL — ABNORMAL HIGH (ref 0.44–1.00)
GFR calc Af Amer: 31 mL/min — ABNORMAL LOW (ref >60)
GFR calc non Af Amer: 27 mL/min — ABNORMAL LOW (ref >60)
GLUCOSE: 79 mg/dL (ref 70–99)
PHOSPHORUS: 2.6 mg/dL (ref 2.5–4.6)
Potassium: 3.6 mmol/L (ref 3.5–5.1)
Sodium: 147 mmol/L — ABNORMAL HIGH (ref 135–145)

## 2018-01-30 LAB — CBC WITH DIFFERENTIAL/PLATELET
Abs Immature Granulocytes: 0.1 10*3/uL (ref 0.0–0.1)
Basophils Absolute: 0 10*3/uL (ref 0.0–0.1)
Basophils Relative: 0 %
EOS ABS: 0.2 10*3/uL (ref 0.0–0.7)
EOS PCT: 2 %
HEMATOCRIT: 44.9 % (ref 36.0–46.0)
HEMOGLOBIN: 13.7 g/dL (ref 12.0–15.0)
IMMATURE GRANULOCYTES: 1 %
LYMPHS ABS: 1.8 10*3/uL (ref 0.7–4.0)
LYMPHS PCT: 24 %
MCH: 29.9 pg (ref 26.0–34.0)
MCHC: 30.5 g/dL (ref 30.0–36.0)
MCV: 98 fL (ref 78.0–100.0)
MONOS PCT: 7 %
Monocytes Absolute: 0.5 10*3/uL (ref 0.1–1.0)
Neutro Abs: 4.9 10*3/uL (ref 1.7–7.7)
Neutrophils Relative %: 66 %
Platelets: 135 10*3/uL — ABNORMAL LOW (ref 150–400)
RBC: 4.58 MIL/uL (ref 3.87–5.11)
RDW: 16.4 % — ABNORMAL HIGH (ref 11.5–15.5)
WBC: 7.4 10*3/uL (ref 4.0–10.5)

## 2018-01-30 LAB — T4, FREE: FREE T4: 1.6 ng/dL (ref 0.82–1.77)

## 2018-01-30 LAB — TSH: TSH: 0.597 u[IU]/mL (ref 0.350–4.500)

## 2018-01-30 LAB — APTT: APTT: 64 s — AB (ref 24–36)

## 2018-01-30 LAB — HEPARIN LEVEL (UNFRACTIONATED): HEPARIN UNFRACTIONATED: 0.35 [IU]/mL (ref 0.30–0.70)

## 2018-01-30 MED ORDER — ASPIRIN EC 81 MG PO TBEC
81.0000 mg | DELAYED_RELEASE_TABLET | Freq: Every day | ORAL | Status: DC
  Administered 2018-01-31 – 2018-02-03 (×4): 81 mg via ORAL
  Filled 2018-01-30 (×4): qty 1

## 2018-01-30 NOTE — Evaluation (Signed)
Clinical/Bedside Swallow Evaluation Patient Details  Name: Heather Benson MRN: [id number] Date of Birth: May 24, 1931  Today's Date: 01/30/2018 Time: SLP Start Time (ACUTE ONLY): 0909 SLP Stop Time (ACUTE ONLY): 0916 SLP Time Calculation (min) (ACUTE ONLY): 7 min  Past Medical History:  Past Medical History:  Diagnosis Date  . Aortic atherosclerosis ([place]) 12/25/2016  . Aortic stenosis, mild 12/14/2015  . Arthritis 08-28-11   right knee pain-surgery planned  . Cancer [facility]) 08-28-11   Melonoma-cheek(many Yrs ago) , recent bx. left  face lesion, past skin cancer lesions  . CAP (community acquired pneumonia) 12/13/2015  . CHF (congestive heart failure) ([place])   . Complication of anesthesia 08-28-11   in past arrythmia-  cardiology has seen in past  . Dysrhythmia 08-28-11   only immed.  to several days after surgery- hx. A.fib. in past  . GERD (gastroesophageal reflux disease) 08-28-11   tx. omeprazole  . Hypercholesterolemia   . Hypertension 08-28-11   tx. meds  . IBS (irritable bowel syndrome)   . Lymphedema   . Neuromuscular disorder ([place]) 08-28-11   hx. Polio age 65-slight residual affects legs  . Osteoarthritis    back and left knee  . Osteoporosis   . Pneumonia 12/2015   hospital x 3 days  . PONV (postoperative nausea and vomiting)   . Renal cyst, right   . Sarcoidosis   . Swelling of extremity 08-28-11   bilateral lower extremities- mid calf down   Past Surgical History:  Past Surgical History:  Procedure Laterality Date  . ABDOMINAL HYSTERECTOMY  08-28-11   Partial-vaginal approach  . BREAST REDUCTION SURGERY  1997  . BUNIONECTOMY  08-28-11   right  . CARDIOVERSION N/A 08/05/2014   Procedure: CARDIOVERSION;  Surgeon: Larey Dresser, MD;  Location: [facility];  Service: Cardiovascular;  Laterality: N/A;  . CATARACT EXTRACTION, BILATERAL  08-28-11   bilateral  . COLONOSCOPY WITH PROPOFOL N/A 12/24/2016   Procedure: COLONOSCOPY WITH PROPOFOL;  Surgeon: Wonda Horner, MD;   Location: [facility] ENDOSCOPY;  Service: Endoscopy;  Laterality: N/A;  . EYE SURGERY     Cataract  . IR VERTEBROPLASTY EA ADDL (T&LS) BX INC UNI/BIL INC INJECT/IMAGING  10/22/2017  . IR VERTEBROPLASTY LUMBAR BX INC UNI/BIL INC/INJECT/IMAGING  10/22/2017  . JOINT REPLACEMENT  Oct. 1, 2012   Right Knee  . KNEE ARTHROSCOPY  09/06/2011   Procedure: ARTHROSCOPY KNEE;  Surgeon: Gearlean Alf, MD;  Location: [facility] ORS;  Service: Orthopedics;  Laterality: Right;  with Synovectomy   HPI:  Heather Benson is a 84 y.o. female with history of paroxysmal atrial fibrillation, chronic kidney disease stage IV, chronic steroid therapy reason not known, diastolic CHF was brought to the ER after patient was found to be weak.  Patient states that she woke up in the middle of the night to go to the bathroom when she started feeling lightheaded.  She slowly sat down to the floor and patient husband called EMS.    Assessment / Plan / Recommendation Clinical Impression  Pt presents with functional swallowing as assessed clinically.  Pt tolerated all conisistencies trialed with no clincial s/s of aspiration.  Pt exhibited adequate clearance of regular solids.  This assessment was performed with pt partially reclined (HoB less than 45 degress, possible less than 30) 2/2 pt's concern for back pain with elevation. Pt states that this is the level at which she has been taking her meals.  RN reports decreased appetite and concern for FTT.  Today pt  when asked to smile stated that she doesn't smile and became tearful.  Pt may benefit from psychiatry consult.  SLP Visit Diagnosis: Dysphagia, unspecified (R13.10)    Aspiration Risk  No limitations    Diet Recommendation Regular;Thin liquid   Liquid Administration via: Cup;Straw Medication Administration: Whole meds with liquid Supervision: Patient able to self feed(encourage oral intake) Compensations: Slow rate;Small sips/bites Postural Changes: (Seated as upright as pt will  toelrate)    Other  Recommendations Recommended Consults: Other (Comment)(Consider psyciatry consult) Oral Care Recommendations: Oral care BID   Follow up Recommendations None        Swallow Study   General Date of Onset: 01/26/18 HPI: Heather Benson is a 84 y.o. female with history of paroxysmal atrial fibrillation, chronic kidney disease stage IV, chronic steroid therapy reason not known, diastolic CHF was brought to the ER after patient was found to be weak.  Patient states that she woke up in the middle of the night to go to the bathroom when she started feeling lightheaded.  She slowly sat down to the floor and patient husband called EMS.  Type of Study: Bedside Swallow Evaluation Previous Swallow Assessment: none Diet Prior to this Study: Regular Temperature Spikes Noted: No History of Recent Intubation: No Behavior/Cognition: Alert;Cooperative;Pleasant mood Oral Cavity Assessment: Within Functional Limits Oral Care Completed by SLP: No Oral Cavity - Dentition: Dentures, top;Adequate natural dentition Vision: Functional for self-feeding Self-Feeding Abilities: Able to feed self Patient Positioning: Partially reclined Baseline Vocal Quality: Normal Volitional Cough: Strong Volitional Swallow: Able to elicit    Oral/Motor/Sensory Function Overall Oral Motor/Sensory Function: Within functional limits   Ice Chips Ice chips: Not tested   Thin Liquid Thin Liquid: Within functional limits Presentation: Straw    Nectar Thick Nectar Thick Liquid: Not tested   Honey Thick Honey Thick Liquid: Not tested   Puree Puree: Within functional limits Presentation: Spoon   Solid     Solid: Within functional limits Presentation: [place], [place], [place] Office: [phone] 01/30/2018,9:33 AM

## 2018-01-30 NOTE — Progress Notes (Signed)
[facility] for Heparin Indication: Atrial fibrillation  Allergies  Allergen Reactions  . Augmentin [Amoxicillin-Pot Clavulanate] Diarrhea and Nausea Only  . Boniva [Ibandronic Acid] Diarrhea  . Codeine Other (See Comments)    "Spaced out feeling" and Lightheadedness  . Hydromorphone Nausea And Vomiting  . Latex Rash    "Burns me"  . Lisinopril Cough  . Adhesive [Tape] Itching and Rash    Patient Measurements: Height: 5\' 4"  (162.6 cm) Weight: 162 lb 7.7 oz (73.7 kg) IBW/kg (Calculated) : 54.7 Heparin Dosing Weight: 70.5  Vital Signs: Temp: 97.5 F (36.4 C) (09/20 0515) Temp Source: Oral (09/20 0515) BP: 135/73 (09/20 0515) Pulse Rate: 69 (09/20 0515)  Labs: Recent Labs    01/28/18 0605 01/28/18 1034 01/28/18 2049 01/29/18 0458 01/30/18 0550  HGB 13.6 14.3  --  13.7 13.7  HCT 43.7 47.0*  --  45.2 44.9  PLT 145* 171  --  153 135*  APTT 141*  --  58* 72* 64*  HEPARINUNFRC 0.80*  --   --  0.41 0.35  CREATININE 1.52*  --   --  1.72* 1.68*    Estimated Creatinine Clearance: 24.5 mL/min (A) (by C-G formula based on SCr of 1.68 mg/dL (H)).  Assessment: 84 yo female with PAF on apixaban PTA. Apixaban on hold since 9/13 AM for kyphoplasty on 9/23. Pharmacy has been consulted to dose heparin drip for NSTEMI and pending kyphoplasty surgery. -heparin level= 0.35 (therapeutic), aPTT= 64 (correlating - Apixaban no longer affecting heparin levels). CBC stable, no bleeding noted.  Goal of Therapy:  Heparin level 0.3-0.7 units/ml Monitor platelets by anticoagulation protocol: Yes  Plan:  -Continue heparin at 700 units/hr -Heparin level and CBC daily -d/c daily aPTT as apixaban no longer affecting heparin levels  Sherlon Handing, PharmD, BCPS Clinical pharmacist 01/30/2018 8:03 AM  **Pharmacist phone directory can now be found on [email or web address] ([email or web address]).  Listed under [place].

## 2018-01-30 NOTE — Plan of Care (Signed)
  Problem: Nutrition: Goal: Adequate nutrition will be maintained Outcome: Progressing   Problem: Pain Managment: Goal: General experience of comfort will improve Outcome: Progressing   

## 2018-01-30 NOTE — Consult Note (Signed)
Chief Complaint: Patient was seen in consultation today for lumbar 3 compression fracture.  Referring Physician(s): Bonnielee Haff  Supervising Physician: Luanne Bras  Patient Status: [facility] - In-pt  History of Present Illness: Heather Benson is a 84 y.o. female with a past medical history of aortic stenosis, HF, hypercholesterolemia, Polio at age 85, pneumonia, GERD, IBS, lymphedema, sarcoidosis, melanoma, osteoarthritis, and osteoporosis. She was scheduled for a lumbar 3 kyphoplasty 01/26/2018 with Dr. Vira Blanco. Unfortunately, she was admitted to [facility] for management of atrial fibrillation 01/26/2018, and she was unable to undergo her procedure. She is currently on a Heparin drip with plans to be discharged home on oral anticoagulation.  IR requested by Dr. Maryland Pink for possible image-guided lumbar 3 kyphoplasty/vertebroplasty while patient is admitted prior to beginning oral anticoagulation. Patient awake and alert laying in bed. Accompanied by husband at bedside. Complains of low back pain, rated 6/10 at this time. Denies fever, chills, numbness/tingling down legs, bladder/bowel incontinence, or urinary symptoms including dysuria or urinary frequency.  Patient is currently receiving Heparin infusion.   Past Medical History:  Diagnosis Date  . Aortic atherosclerosis ([place]) 12/25/2016  . Aortic stenosis, mild 12/14/2015  . Arthritis 08-28-11   right knee pain-surgery planned  . Cancer [facility]) 08-28-11   Melonoma-cheek(many Yrs ago) , recent bx. left  face lesion, past skin cancer lesions  . CAP (community acquired pneumonia) 12/13/2015  . CHF (congestive heart failure) ([place])   . Complication of anesthesia 08-28-11   in past arrythmia-  cardiology has seen in past  . Dysrhythmia 08-28-11   only immed.  to several days after surgery- hx. A.fib. in past  . GERD (gastroesophageal reflux disease) 08-28-11   tx. omeprazole  . Hypercholesterolemia   . Hypertension 08-28-11   tx. meds    . IBS (irritable bowel syndrome)   . Lymphedema   . Neuromuscular disorder ([place]) 08-28-11   hx. Polio age 27-slight residual affects legs  . Osteoarthritis    back and left knee  . Osteoporosis   . Pneumonia 12/2015   hospital x 3 days  . PONV (postoperative nausea and vomiting)   . Renal cyst, right   . Sarcoidosis   . Swelling of extremity 08-28-11   bilateral lower extremities- mid calf down    Past Surgical History:  Procedure Laterality Date  . ABDOMINAL HYSTERECTOMY  08-28-11   Partial-vaginal approach  . BREAST REDUCTION SURGERY  1997  . BUNIONECTOMY  08-28-11   right  . CARDIOVERSION N/A 08/05/2014   Procedure: CARDIOVERSION;  Surgeon: Larey Dresser, MD;  Location: [facility];  Service: Cardiovascular;  Laterality: N/A;  . CATARACT EXTRACTION, BILATERAL  08-28-11   bilateral  . COLONOSCOPY WITH PROPOFOL N/A 12/24/2016   Procedure: COLONOSCOPY WITH PROPOFOL;  Surgeon: Wonda Horner, MD;  Location: [facility] ENDOSCOPY;  Service: Endoscopy;  Laterality: N/A;  . EYE SURGERY     Cataract  . IR VERTEBROPLASTY EA ADDL (T&LS) BX INC UNI/BIL INC INJECT/IMAGING  10/22/2017  . IR VERTEBROPLASTY LUMBAR BX INC UNI/BIL INC/INJECT/IMAGING  10/22/2017  . JOINT REPLACEMENT  Oct. 1, 2012   Right Knee  . KNEE ARTHROSCOPY  09/06/2011   Procedure: ARTHROSCOPY KNEE;  Surgeon: Gearlean Alf, MD;  Location: [facility] ORS;  Service: Orthopedics;  Laterality: Right;  with Synovectomy    Allergies: Augmentin [amoxicillin-pot clavulanate]; Boniva [ibandronic acid]; Codeine; Hydromorphone; Latex; Lisinopril; and Adhesive [tape]  Medications: Prior to Admission medications   Medication Sig Start Date End Date Taking? Authorizing Provider  acetaminophen (TYLENOL)  500 MG tablet Take 1,000 mg by mouth every 8 (eight) hours as needed (for pain).    Yes [provider]  amiodarone (PACERONE) 200 MG tablet Take 1 tablet (200 mg total) by mouth daily. 07/17/17  Yes Jettie Booze, MD  atorvastatin  (LIPITOR) 10 MG tablet Take 1 tablet (10 mg total) by mouth daily. 09/08/17  Yes Jettie Booze, MD  ELIQUIS 2.5 MG TABS tablet TAKE 1 TABLET BY MOUTH TWICE A DAY Patient taking differently: Take 2.5 mg by mouth 2 (two) times daily.  12/22/17  Yes Jettie Booze, MD  furosemide (LASIX) 20 MG tablet Take one tablet (20mg  total) by mouth daily, alternating with 2 tablets by mouth daily (40mg  total). Patient taking differently: Take 20 mg by mouth daily.  11/24/17  Yes Isaiah Serge, NP  latanoprost (XALATAN) 0.005 % ophthalmic solution Place 1 drop into both eyes at bedtime. 08/11/14  Yes [provider]  mirtazapine (REMERON) 15 MG tablet Take 15 mg by mouth at bedtime.   Yes [provider]  potassium chloride (K-DUR) 10 MEQ tablet Take one tablet by mouth daily on the days that you take Furosemide 40mg . Patient taking differently: Take 10 mEq by mouth daily.  11/24/17  Yes Isaiah Serge, NP  predniSONE (DELTASONE) 10 MG tablet Take 10 mg by mouth daily with breakfast.   Yes [provider]  traMADol (ULTRAM) 50 MG tablet Take 100 mg by mouth 3 (three) times daily. 01/20/18  Yes [provider]  bisacodyl (DULCOLAX) 10 MG suppository Place 1 suppository (10 mg total) rectally daily as needed for moderate constipation. Patient not taking: Reported on 01/26/2018 10/23/17   Samuella Cota, MD  gabapentin (NEURONTIN) 100 MG capsule Take 1 capsule (100 mg total) by mouth at bedtime. Patient not taking: Reported on 01/26/2018 10/23/17   Samuella Cota, MD  oxyCODONE (OXY IR/ROXICODONE) 5 MG immediate release tablet Take 1 tablet (5 mg total) by mouth every 6 (six) hours as needed for severe pain. Patient not taking: Reported on 01/26/2018 10/23/17   Samuella Cota, MD  polyethylene glycol [facility] Floria Raveling) packet Take 17 g by mouth 2 (two) times daily. Patient not taking: Reported on 01/26/2018 10/23/17   Samuella Cota, MD  Respiratory Therapy  Supplies (FLUTTER) DEVI Use as directed. 02/06/16   Mannam, Hart Robinsons, MD  senna (SENOKOT) 8.6 MG TABS tablet Take 1 tablet (8.6 mg total) by mouth at bedtime. Patient not taking: Reported on 01/26/2018 10/23/17   Samuella Cota, MD     Family History  Problem Relation Age of Onset  . Heart disease Mother        Varicose Veins  . Hyperlipidemia Mother   . Hypertension Mother   . Colon cancer Mother   . Heart disease Father        Heart Disease before age 71  . Hypertension Father   . Emphysema Father   . Breast cancer Sister   . Stroke Maternal Grandmother   . Heart attack Brother     Social History   Socioeconomic History  . Marital status: Married    Spouse name: [name]  . Number of children: 3  . Years of education: 68  . Highest education level: Not on file  Occupational History  . Occupation: Retired- [employer]  . Financial resource strain: Not on file  . Food insecurity:    Worry: Not on file    Inability: Not  on file  . Transportation needs:    Medical: Not on file    Non-medical: Not on file  Tobacco Use  . Smoking status: Never Smoker  . Smokeless tobacco: Never Used  Substance and Sexual Activity  . Alcohol use: No    Alcohol/week: 0.0 standard drinks  . Drug use: No  . Sexual activity: Not Currently    Partners: Male  Lifestyle  . Physical activity:    Days per week: Not on file    Minutes per session: Not on file  . Stress: Not on file  Relationships  . Social connections:    Talks on phone: Not on file    Gets together: Not on file    Attends religious service: Not on file    Active member of club or organization: Not on file    Attends meetings of clubs or organizations: Not on file    Relationship status: Not on file  Other Topics Concern  . Not on file  Social History Narrative   Lives with husband, Hilbert Odor at home   Caffeine use: Drinks coffee/tea/caffeine free soda   OCCUPATION: retired      Review of Systems: A 12  point ROS discussed and pertinent positives are indicated in the HPI above.  All other systems are negative.  Review of Systems  Constitutional: Negative for chills and fever.  Respiratory: Negative for shortness of breath and wheezing.   Cardiovascular: Negative for chest pain and palpitations.  Gastrointestinal:       Negative for bowel incontinence.  Genitourinary: Negative for dysuria and frequency.       Negative for bladder incontinence.  Musculoskeletal: Positive for back pain.  Neurological: Negative for numbness.  Psychiatric/Behavioral: Negative for behavioral problems and confusion.    Vital Signs: BP 117/65 (BP Location: Right Arm)   Pulse 65   Temp 98.2 F (36.8 C) (Oral)   Resp 18   Ht 5\' 4"  (1.626 m)   Wt 162 lb 7.7 oz (73.7 kg)   SpO2 97%   BMI 27.89 kg/m   Physical Exam  Constitutional: She is oriented to person, place, and time. She appears well-developed and well-nourished. No distress.  Cardiovascular: Normal rate, regular rhythm and normal heart sounds.  No murmur heard. Pulmonary/Chest: Effort normal and breath sounds normal. No respiratory distress. She has no wheezes.  Musculoskeletal:  Mild-moderate midline low back pain at approximate level of lumbar 3.  Neurological: She is alert and oriented to person, place, and time.  Skin: Skin is warm and dry.  Psychiatric: She has a normal mood and affect. Her behavior is normal. Judgment and thought content normal.  Nursing note and vitals reviewed.    MD Evaluation Airway: WNL Heart: WNL Abdomen: WNL Chest/ Lungs: WNL ASA  Classification: 3 Mallampati/Airway Score: Two   Imaging: Ct Head Wo Contrast  Result Date: 01/26/2018 CLINICAL DATA:  Dizziness with standing. LEFT-sided weakness. History of atrial fibrillation, hypercholesterolemia, sarcoidosis. EXAM: CT HEAD WITHOUT CONTRAST TECHNIQUE: Contiguous axial images were obtained from the base of the skull through the vertex without intravenous  contrast. COMPARISON:  MRI head November 23, 2015 FINDINGS: BRAIN: No intraparenchymal hemorrhage, mass effect nor midline shift. Moderate to severe parenchymal brain volume loss. No hydrocephalus. Old small LEFT cerebellar infarct. Patchy supratentorial white matter. No acute large vascular territory infarcts. No abnormal extra-axial fluid collections. Basal cisterns are patent. 6 mm pineal cyst. VASCULAR: Moderate calcific atherosclerosis of the carotid siphons. SKULL: No skull fracture. Moderate RIGHT temporomandibular osteoarthrosis.  Osteopenia. No significant scalp soft tissue swelling. SINUSES/ORBITS: Mild paranasal sinus mucosal thickening. RIGHT frontal sinus osteoma. Mastoid air cells are well aerated.The included ocular globes and orbital contents are non-suspicious. Status post bilateral ocular lens implants. OTHER: None. IMPRESSION: 1. No acute intracranial process. 2. Stable moderate to severe parenchymal brain volume loss. 3. Progressed moderate chronic small vessel ischemic changes. Electronically Signed   By: Elon Alas M.D.   On: 01/26/2018 03:40   Ct Chest Wo Contrast  Result Date: 01/26/2018 CLINICAL DATA:  Fever and hypoxia. Pneumonia, unresolved for complicated EXAM: CT CHEST WITHOUT CONTRAST TECHNIQUE: Multidetector CT imaging of the chest was performed following the standard protocol without IV contrast. COMPARISON:  12/03/2013 FINDINGS: Cardiovascular: No cardiomegaly or pericardial effusion. There is mitral and aortic annular calcification. Degree of aortic valve calcification. Atherosclerosis of the aorta and coronaries. Mediastinum/Nodes: Negative for adenopathy or mass. Lungs/Pleura: Streaky opacity with volume loss in the right perihilar region and right lower lobe, consistent with scarring. This was also seen previously, with accentuation likely from lower lung volumes today. There is a degree of subpleural reticulation also on the left, nonspecific. No honeycombing. No air  bronchogram, edema, effusion, or masslike finding Upper Abdomen: No acute finding Musculoskeletal: No acute or aggressive finding. IMPRESSION: 1. Right perihilar atelectasis/scarring. No consolidating pneumonia. 2. Low lung volumes with nonspecific subpleural reticulation. Electronically Signed   By: Monte Fantasia M.D.   On: 01/26/2018 11:30   Mr Lumbar Spine Wo Contrast  Result Date: 01/21/2018 CLINICAL DATA:  84 y/o F; 6 months of lower back pain. Weakness in the legs. EXAM: MRI LUMBAR SPINE WITHOUT CONTRAST TECHNIQUE: Multiplanar, multisequence MR imaging of the lumbar spine was performed. No intravenous contrast was administered. COMPARISON:  12/12/2017 lumbar spine CT. 10/18/2017 lumbar spine MRI. FINDINGS: Segmentation:  Standard. Alignment:  Physiologic. Vertebrae: Interval L3 anterior superior endplate compression deformity minimal loss of height and edema indicating recent injury. Interval L2 inferior endplate compression deformity with mild central loss of vertebral body height and edema indicating recent injury. Stable chronic severe T12 and L1 compression deformities and mild chronic superior endplate deformity of the L2 vertebral body. Stable chronic mild compression deformity of the L4 vertebral body superior endplate. Low signal kyphoplasty material within the L1, L2, and L4 vertebral bodies. Conus medullaris and cauda equina: Conus extends to the L2 level. Conus and cauda equina appear normal. Paraspinal and other soft tissues: Left kidney interpolar cyst measuring 21 mm and multiple additional smaller cysts in the kidneys bilaterally. Low signal right kidney lower pole subcentimeter cyst, likely hemorrhagic cyst. Disc levels: T11-12: Mild retropulsion of the L1 superior endplate resulting in ventral thecal sac effacement and mild canal stenosis. No significant foraminal stenosis. T12-L1: Moderate stable bony retropulsion of the L1 vertebral body with ventral thecal sac effacement and minimal  contact on the anterior cord. Mild canal stenosis and mild bilateral foraminal stenosis. No cord compression. L1-2: Retropulsion of the L1 vertebral body is eccentric to the right and results in mild right-sided foraminal stenosis. No left foraminal stenosis. At the disc level there is no significant canal stenosis. L2-3: No significant disc displacement, foraminal stenosis, or canal stenosis. L3-4: Mild diffuse disc bulge slight retropulsion of the L4 superior endplate results in mild canal stenosis. Combined with left-greater-than-right facet hypertrophy there is mild left foraminal stenosis. No right foraminal stenosis. L4-5: Mild bilateral facet hypertrophy and small right foraminal disc protrusion. No significant foraminal or canal stenosis. L5-S1: Mild disc bulge and facet hypertrophy. No significant foraminal  or canal stenosis. IMPRESSION: 1. Interval L3 anterior superior endplate compression deformity with minimal loss of height and edema indicating recent injury. 2. Interval L2 inferior endplate compression deformity with central loss of vertebral body height and edema indicating recent injury. 3. Stable chronic severe T12 compression deformity, severe L1 compression deformity, mild L2 superior endplate compression deformity, and L4 mild superior endplate compression deformity. 4. Multilevel degenerative changes of the spine combined with retropulsion of the T12, L1, and L4 vertebral bodies results in multiple levels of mild canal and foraminal stenosis. No high-grade stenosis. Electronically Signed   By: Kristine Garbe M.D.   On: 01/21/2018 01:06   Nm Pulmonary Vent And Perf (v/q Scan)  Result Date: 01/26/2018 CLINICAL DATA:  Shortness of breath. EXAM: NUCLEAR MEDICINE VENTILATION - PERFUSION LUNG SCAN TECHNIQUE: Ventilation images were obtained in multiple projections using inhaled aerosol Tc-5m DTPA. Perfusion images were obtained in multiple projections after intravenous injection of  Tc-67m-MAA. RADIOPHARMACEUTICALS:  Thirty-two mCi of Tc-5m DTPA aerosol inhalation and 4.3 mCi Tc56m-MAA IV COMPARISON:  Chest x-rays dated 01/26/2018 and 10/15/2017 FINDINGS: The patient has chronic elevation of the right hemidiaphragm. The patient swallowed moderate amount of the DTPA. Ventilation: No focal ventilation defect. Perfusion: No wedge shaped peripheral perfusion defects to suggest acute pulmonary embolism. IMPRESSION: Normal ventilation perfusion defect. Electronically Signed   By: Lorriane Shire M.D.   On: 01/26/2018 09:15   Dg Chest Port 1 View  Result Date: 01/27/2018 CLINICAL DATA:  84 year old female with pulmonary edema. History of aortic stenosis and congestive heart failure. EXAM: PORTABLE CHEST 1 VIEW COMPARISON:  Prior chest x-ray and CT scan of the chest 01/26/2018 FINDINGS: Stable cardiac and mediastinal contours. Progressive atelectasis of the right lower lobe now obscuring the right hemidiaphragm. Stable mild atelectasis versus scarring in the left lower lobe. Biapical pleuroparenchymal scarring. Pulmonary vascular congestion without overt edema. Atherosclerotic calcifications again noted in the transverse aorta. Calcification of the mitral valve annulus. No acute osseous abnormality. IMPRESSION: 1. Progressive right lower lobe atelectasis now resulting in obscuration of the right hemidiaphragm. 2. Mild vascular congestion without overt edema. Electronically Signed   By: Jacqulynn Cadet M.D.   On: 01/27/2018 09:20   Dg Chest Portable 1 View  Result Date: 01/26/2018 CLINICAL DATA:  Fever, hypoxia tonight. EXAM: PORTABLE CHEST 1 VIEW COMPARISON:  Chest radiograph October 15, 2017 and December 26, 2015 FINDINGS: Increasing elevated RIGHT hemidiaphragm. RIGHT mid lung zone bandlike density overlapping fissure. Mild bronchitic changes. Patchy airspace opacity silhouetting RIGHT pulmonary hilum. Cardiac silhouette is normal size. Calcified aortic arch. Biapical pleural thickening. No  pneumothorax. Osteopenia. IMPRESSION: Bronchitic changes with RIGHT atelectasis. Potential RIGHT perihilar pneumonia. Aortic Atherosclerosis (ICD10-I70.0). Electronically Signed   By: Elon Alas M.D.   On: 01/26/2018 03:48   Dg Knee Right Port  Result Date: 01/30/2018 CLINICAL DATA:  Pain and swelling.  Total knee arthroplasty. EXAM: PORTABLE RIGHT KNEE - 1-2 VIEW COMPARISON:  Radiographs dated 05/27/2010 FINDINGS: There is no fracture or dislocation. There is a joint effusion. No evidence of loosening of the components of the total knee prosthesis. Osteopenia. IMPRESSION: Joint effusion.  No other acute abnormality. Electronically Signed   By: Lorriane Shire M.D.   On: 01/30/2018 12:05    Labs:  CBC: Recent Labs    01/28/18 0605 01/28/18 1034 01/29/18 0458 01/30/18 0550  WBC 11.4* 11.9* 8.7 7.4  HGB 13.6 14.3 13.7 13.7  HCT 43.7 47.0* 45.2 44.9  PLT 145* 171 153 135*    COAGS: Recent Labs  10/17/17 0602  10/21/17 1016  01/26/18 1016  01/28/18 0605 01/28/18 2049 01/29/18 0458 01/30/18 0550  INR 1.40  --  1.04  --  1.07  --   --   --   --   --   APTT 76*   < >  --    < > 28   < > 141* 58* 72* 64*   < > = values in this interval not displayed.    BMP: Recent Labs    01/27/18 0708 01/28/18 0605 01/29/18 0458 01/30/18 0550  NA 143 143 144 147*  K 4.5 4.2 3.9 3.6  CL 108 108 108 109  CO2 18* 24 30 28   GLUCOSE 64* 103* 106* 79  BUN 23 25* 31* 29*  CALCIUM 9.0 8.5* 8.6* 8.7*  CREATININE 1.66* 1.52* 1.72* 1.68*  GFRNONAA 27* 30* 26* 27*  GFRAA 32* 35* 30* 31*    LIVER FUNCTION TESTS: Recent Labs    09/02/17 0852 11/20/17 1211  01/26/18 1303 01/27/18 0708 01/28/18 0605 01/29/18 0458 01/30/18 0550  BILITOT 0.6 1.0  --  1.1  --   --  0.8  --   AST 21 23  --  39  --   --  28  --   ALT 24 19  --  38  --   --  30  --   ALKPHOS 122* 88  --  113  --   --  103  --   PROT 6.7 7.0  --  4.7*  --   --  4.6*  --   ALBUMIN 4.0 3.8   < > 2.2* 2.2* 2.1* 2.1*  2.1*   < > = values in this interval not displayed.    TUMOR MARKERS: No results for input(s): AFPTM, CEA, CA199, CHROMGRNA in the last 8760 hours.  Assessment and Plan:  Lumbar 3 compression fracture. Plan for image-guided lumbar 3 kyphoplasty/vertebroplasty tentative for 9/23 with Dr. Estanislado Pandy. Patient will be NPO at midnight the night before procedure. Denies fever and WBCs WNL. INR 1.07 seconds 01/26/2018. Patient is currently receiving Heparin infusion- will stop morning of procedure, prior to procedure.  Risks and benefits of lumbar 3 kyphoplasty/vertebroplasty were discussed with the patient including, but not limited to education regarding the natural healing process of compression fractures without intervention, bleeding, infection, cement migration which may cause spinal cord damage, paralysis, pulmonary embolism or even death. This interventional procedure involves the use of X-rays and because of the nature of the planned procedure, it is possible that we will have prolonged use of X-ray fluoroscopy. Potential radiation risks to you include (but are not limited to) the following: - A slightly elevated risk for cancer  several years later in life. This risk is typically less than 0.5% percent. This risk is low in comparison to the normal incidence of human cancer, which is 33% for women and 50% for men according to the [place]. - Radiation induced injury can include skin redness, resembling a rash, tissue breakdown / ulcers and hair loss (which can be temporary or permanent).  The likelihood of either of these occurring depends on the difficulty of the procedure and whether you are sensitive to radiation due to previous procedures, disease, or genetic conditions.  IF your procedure requires a prolonged use of radiation, you will be notified and given written instructions for further action.  It is your responsibility to monitor the irradiated area for the 2 weeks  following the procedure and to notify your  physician if you are concerned that you have suffered a radiation induced injury.   All of the patient's questions were answered, patient is agreeable to proceed. Consent signed and in chart.   Thank you for this interesting consult.  I greatly enjoyed meeting Heather Benson and look forward to participating in their care.  A copy of this report was sent to the requesting provider on this date.  Electronically Signed: Earley Abide, PA-C 01/30/2018, 3:28 PM   I spent a total of 20 Minutes in face to face in clinical consultation, greater than 50% of which was counseling/coordinating care for lumbar 3 compression fracture.

## 2018-01-30 NOTE — Evaluation (Signed)
Physical Therapy Evaluation Patient Details Name: Heather Benson MRN: [id number] DOB: 1931-11-11 Today's Date: 01/30/2018   History of Present Illness  Patient is a 84 y/o female who presents with lightheadedness and weakness after being lowered onto floor from spouse. CXR-showing infiltrates concerning for pneumonia. Also with sepsis. Plan for kyphoplasty prior to admission. PMH includes HTN, CKD stage III, shingles, paroxysmal atrial fibrillation  Clinical Impression  Patient presents with generalized weakness, RLE/back pain, and impaired mobility s/p above. Mobility limited today due to back pain- as pt only tolerated half way sitting up on EOB. Declined any further movement. Planned for possible kyphoplasty this admission If cleared by cardiology. PTA, pt uses RW and spouse for support during ambulation and requires assist for ADLs. Weaned pt down to 2L/min 02 as she is on room air at home. Informed RN that pt needs pain meds to help tolerate mobility. If pt not able to tolerate mobility, will likely require SNF to maximize independence and mobility prior to return home. Will follow.    Follow Up Recommendations SNF;Supervision for mobility/OOB    Equipment Recommendations  None recommended by PT    Recommendations for Other Services OT consult     Precautions / Restrictions Precautions Precautions: Fall Restrictions Weight Bearing Restrictions: No      Mobility  Bed Mobility Overal bed mobility: Needs Assistance Bed Mobility: Rolling;Sidelying to Sit;Sit to Sidelying Rolling: Mod assist Sidelying to sit: Max assist;HOB elevated     Sit to sidelying: Mod assist;HOB elevated General bed mobility comments: Step by step cues for log roll technique; assist to reach for rail, bring RLE off bed and elevate trunk. Got almost all the way upright and then pt refused any other mvoement due to pain in back.  Transfers                 General transfer comment: Deferred due to  back pain  Ambulation/Gait                Stairs            Wheelchair Mobility    Modified Rankin (Stroke Patients Only)       Balance Overall balance assessment: Needs assistance;History of Falls Sitting-balance support: Feet supported;Bilateral upper extremity supported Sitting balance-Leahy Scale: Poor Sitting balance - Comments: Requires BUE support for sitting EOB, not able to get fully upright due to pain, leaning on LUE. Postural control: Left lateral lean                                   Pertinent Vitals/Pain Pain Assessment: Faces Faces Pain Scale: Hurts whole lot Pain Location: back with movement Pain Descriptors / Indicators: Discomfort;Grimacing;Guarding;Aching Pain Intervention(s): Monitored during session;Limited activity within patient's tolerance;Repositioned;Patient requesting pain meds-RN notified    Home Living Family/patient expects to be discharged to:: Private residence Living Arrangements: Spouse/significant other Available Help at Discharge: Family;Available 24 hours/day Type of Home: House Home Access: Level entry     Home Layout: One level Home Equipment: Walker - 2 wheels;Cane - single point;Hand held shower head;Grab bars - tub/shower;Shower seat - built in      Prior Function Level of Independence: Needs assistance   Gait / Transfers Assistance Needed: Uses RW or husband for ambulation support.   ADL's / Homemaking Assistance Needed: Does not do any IADLs per son even though she says she does. Assist with bathing per spouse.  Hand Dominance   Dominant Hand: Right    Extremity/Trunk Assessment   Upper Extremity Assessment Upper Extremity Assessment: Defer to OT evaluation    Lower Extremity Assessment Lower Extremity Assessment: Generalized weakness;RLE deficits/detail RLE Deficits / Details: Difficulty mobilizing RLE due to pain (fell on that leg PTA).    Cervical / Trunk  Assessment Cervical / Trunk Assessment: Kyphotic;Other exceptions Cervical / Trunk Exceptions: L1 compression fx   Communication   Communication: No difficulties  Cognition Arousal/Alertness: Awake/alert Behavior During Therapy: WFL for tasks assessed/performed Overall Cognitive Status: History of cognitive impairments - at baseline Area of Impairment: Memory                     Memory: Decreased short-term memory         General Comments: for basic tasks, likely baseline however question memory as pt stating she cooks at home and son says she does not.      General Comments General comments (skin integrity, edema, etc.): Spouse present during session; son at end.    Exercises     Assessment/Plan    PT Assessment Patient needs continued PT services  PT Problem List Decreased strength;Decreased mobility;Decreased cognition;Decreased activity tolerance;Decreased balance;Pain;Cardiopulmonary status limiting activity       PT Treatment Interventions Functional mobility training;Balance training;Patient/family education;Therapeutic activities;Gait training;Therapeutic exercise;DME instruction    PT Goals (Current goals can be found in the Care Plan section)  Acute Rehab PT Goals Patient Stated Goal: to be able to move without pain PT Goal Formulation: With patient Time For Goal Achievement: 02/13/18 Potential to Achieve Goals: Good    Frequency Min 3X/week   Barriers to discharge        Co-evaluation               AM-PAC PT "6 Clicks" Daily Activity  Outcome Measure Difficulty turning over in bed (including adjusting bedclothes, sheets and blankets)?: Unable Difficulty moving from lying on back to sitting on the side of the bed? : Unable Difficulty sitting down on and standing up from a chair with arms (e.g., wheelchair, bedside commode, etc,.)?: Unable Help needed moving to and from a bed to chair (including a wheelchair)?: Total Help needed walking in  hospital room?: Total Help needed climbing 3-5 steps with a railing? : Total 6 Click Score: 6    End of Session Equipment Utilized During Treatment: Oxygen Activity Tolerance: Patient limited by pain Patient left: in bed;with call bell/phone within reach;with bed alarm set;with family/visitor present Nurse Communication: Mobility status;Patient requests pain meds PT Visit Diagnosis: Pain;Muscle weakness (generalized) (M62.81);Difficulty in walking, not elsewhere classified (R26.2) Pain - Right/Left: Right Pain - part of body: Leg(back)    Time: 7948-0165 PT Time Calculation (min) (ACUTE ONLY): 23 min   Charges:   PT Evaluation $PT Eval Moderate Complexity: 1 Mod PT Treatments $Therapeutic Activity: 8-22 mins        Wray Kearns, PT, DPT Acute Rehabilitation Services Pager [phone] Office [phone]      Marguarite Arbour [address] 01/30/2018, 1:15 PM

## 2018-01-30 NOTE — Progress Notes (Signed)
[facility] PROGRESS NOTE  Heather Benson WCH:[id number] DOB: 05-Feb-1932 DOA: 01/26/2018  PCP: [name], MD  Brief History/Interval Summary: 59 WF Paroxysmal atrial fibrillation/AVNRT--prior amiodarone-- on Eliquis [off of this since 9/16]--DCCV 07/2014.  So has history of diastolic CHF, chronic kidney disease stage IV, chronic steroid use, recent compression fracture status post kyphoplasty x1 on 8/19 to L2.  Another kyphoplasty was planned for 9/16 to L3 but patient got hospitalized.  She presented with lightheadedness hypotension x-ray findings suggesting pneumonia.  She was hospitalized.  Subsequently noted to have elevated troponin.  Cardiology was consulted.  Consultants: Cardiology.  Procedures:   Transthoracic echocardiogram 01/27/18 Study Conclusions  - Left ventricle: The cavity size was normal. Wall thickness was   normal. Systolic function was moderately reduced. The estimated   ejection fraction was in the range of 35% to 40%. Severe   hypokinesis of the mid-apicalanteroseptal, anterior, inferior,   and apical myocardium; consistent with ischemia in the   distribution of the left anterior descending coronary artery; new   since the study of 12/14/2015. Doppler parameters are consistent   with abnormal left ventricular relaxation (grade 1 diastolic   dysfunction). - Aortic valve: Right coronary cusp mobility was moderately   restricted. There was mild stenosis. There was mild regurgitation   directed centrally in the LVOT. Valve area (VTI): 1.55 cm^2.   Valve area (Vmax): 1.38 cm^2. Valve area (Vmean): 1.48 cm^2. - Mitral valve: Moderately calcified annulus. There was mild to   moderate regurgitation directed centrally. - Left atrium: The atrium was moderately to severely dilated. - Right atrium: The atrium was mildly dilated. - Tricuspid valve: There was moderate regurgitation directed   centrally. - Pulmonary arteries: Systolic pressure was moderately  increased.   PA peak pressure: 59 mm Hg (S).  Impressions:  - New extensive anteroapical wall motion abnormality. Consider LAD   ischemia versus stress cardiomyopathy (takotsubo syndrome).   Antibiotics: Patient was on Cefdinir initially.  Currently on Zosyn.  Subjective/Interval History: Patient continues to have significant back pain with minimal movement.  Denies any chest pain or shortness of breath.  No nausea or vomiting.  Her husband is at the bedside.  ROS: Denies any headaches.  Objective:  Vital Signs  Vitals:   01/29/18 2101 01/29/18 2348 01/30/18 0515 01/30/18 0812  BP: 125/72 121/75 135/73 118/63  Pulse: 66 68 69 64  Resp: 20 16 18    Temp: 98.6 F (37 C) 97.6 F (36.4 C) (!) 97.5 F (36.4 C)   TempSrc: Oral Oral Oral   SpO2: 96% 98% 99%   Weight:   73.7 kg   Height:        Intake/Output Summary (Last 24 hours) at 01/30/2018 1113 Last data filed at 01/30/2018 0400 Gross per 24 hour  Intake 480 ml  Output 200 ml  Net 280 ml   Filed Weights   01/28/18 0357 01/29/18 0450 01/30/18 0515  Weight: 75.5 kg 74.3 kg 73.7 kg    General appearance: alert, cooperative, appears stated age and no distress Head: Normocephalic, without obvious abnormality, atraumatic Resp: clear to auscultation bilaterally Cardio: regular rate and rhythm, S1, S2 normal, no murmur, click, rub or gallop GI: soft, non-tender; bowel sounds normal; no masses,  no organomegaly Extremities:  Somewhat restricted range of motion of the right knee.  Noted to be more swollen compared to the left. Pulses: 2+ and symmetric Neurologic: Alert and oriented x3.  Cranial nerves II to XII intact.  Motor strength equal bilateral upper  and lower extremities.  Multiple move both lower extremities.  Right side is somewhat limited due to knee pain.   Lab Results:  Data Reviewed: I have personally reviewed following labs and imaging studies  CBC: Recent Labs  Lab 01/26/18 1016 01/27/18 0708  01/28/18 0605 01/28/18 1034 01/29/18 0458 01/30/18 0550  WBC 11.9* 12.5* 11.4* 11.9* 8.7 7.4  NEUTROABS 9.8* 10.1* 9.4* 9.8*  --  4.9  HGB 14.3 14.7 13.6 14.3 13.7 13.7  HCT 48.5* 50.7* 43.7 47.0* 45.2 44.9  MCV 99.8 102.6* 96.0 96.1 97.4 98.0  PLT 147* 133* 145* 171 153 135*    Basic Metabolic Panel: Recent Labs  Lab 01/26/18 1303 01/27/18 0708 01/28/18 0605 01/29/18 0458 01/30/18 0550  NA 139 143 143 144 147*  K 4.7 4.5 4.2 3.9 3.6  CL 105 108 108 108 109  CO2 24 18* 24 30 28   GLUCOSE 108* 64* 103* 106* 79  BUN 22 23 25* 31* 29*  CREATININE 1.79* 1.66* 1.52* 1.72* 1.68*  CALCIUM 7.9* 9.0 8.5* 8.6* 8.7*  MG  --  2.0 2.2  --   --   PHOS  --  3.9 2.9  --  2.6    GFR: Estimated Creatinine Clearance: 24.5 mL/min (A) (by C-G formula based on SCr of 1.68 mg/dL (H)).  Liver Function Tests: Recent Labs  Lab 01/26/18 1303 01/27/18 0708 01/28/18 0605 01/29/18 0458 01/30/18 0550  AST 39  --   --  28  --   ALT 38  --   --  30  --   ALKPHOS 113  --   --  103  --   BILITOT 1.1  --   --  0.8  --   PROT 4.7*  --   --  4.6*  --   ALBUMIN 2.2* 2.2* 2.1* 2.1* 2.1*     Coagulation Profile: Recent Labs  Lab 01/26/18 1016  INR 1.07    Cardiac Enzymes: Recent Labs  Lab 01/26/18 1303 01/26/18 1546 01/27/18 0708  TROPONINI 0.91* 0.88* 0.40*    CBG: Recent Labs  Lab 01/29/18 1658 01/29/18 2038 01/30/18 0041 01/30/18 0507 01/30/18 0734  GLUCAP 98 119* 103* 85 68*     Thyroid Function Tests: Recent Labs    01/30/18 0550  TSH 0.597  FREET4 1.60     Recent Results (from the past 240 hour(s))  Blood Culture (routine x 2)     Status: None (Preliminary result)   Collection Time: 01/26/18  3:55 AM  Result Value Ref Range Status   Specimen Description BLOOD RIGHT ANTECUBITAL  Final   Special Requests   Final    BOTTLES DRAWN AEROBIC AND ANAEROBIC Blood Culture adequate volume   Culture   Final    NO GROWTH 4 DAYS Performed at [facility],  [place] [address], [place], [place] [address]    Report Status PENDING  Incomplete  Blood Culture (routine x 2)     Status: None (Preliminary result)   Collection Time: 01/26/18  4:03 AM  Result Value Ref Range Status   Specimen Description BLOOD LEFT FOREARM  Final   Special Requests   Final    BOTTLES DRAWN AEROBIC AND ANAEROBIC Blood Culture results may not be optimal due to an excessive volume of blood received in culture bottles   Culture   Final    NO GROWTH 4 DAYS Performed at [facility], [place] [address], [place], [place] [address]    Report Status PENDING  Incomplete  MRSA PCR Screening     Status: None   Collection Time: 01/26/18  5:56 PM  Result Value Ref Range Status   MRSA by PCR NEGATIVE NEGATIVE Final    Comment:        The GeneXpert MRSA Assay (FDA approved for NASAL specimens only), is one component of a comprehensive MRSA colonization surveillance program. It is not intended to diagnose MRSA infection nor to guide or monitor treatment for MRSA infections. Performed at [facility], [place] [address] [place], [place] [address]       Radiology Studies: No results found.   Medications:  Scheduled: . amiodarone  200 mg Oral Daily  . [START ON 01/31/2018] aspirin EC  81 mg Oral Daily  . atorvastatin  10 mg Oral Daily  . carvedilol  3.125 mg Oral BID WC  . hydrocortisone sod succinate (SOLU-CORTEF) inj  50 mg Intravenous Q24H  . latanoprost  1 drop Both Eyes QHS  . mirtazapine  15 mg Oral QHS   Continuous: . sodium chloride 10 mL/hr at 01/29/18 0200  . heparin 700 Units/hr (01/30/18 0521)  . piperacillin-tazobactam (ZOSYN)  IV 3.375 g (01/30/18 0511)   KAJ:GOTLXBWIOMBTD **OR** acetaminophen, ondansetron **OR** ondansetron (ZOFRAN) IV  Assessment/Plan:    Pneumonia involving right lung concerning for aspiration Seen by speech therapy.  On a regular diet.  Patient was initially on oral antibiotics.  Changed over to Zosyn on 9/19.  Respiratory  status appears to be stable.  Continue to monitor closely.  Paroxysmal atrial fibrillation/AVNRT/chronic combined systolic and diastolic CHF Previously on anticoagulation with apixaban.  Was held recently so that she can undergo kyphoplasty.  Echocardiogram shows low EF compared to previous.  Cardiology is following.  Patient also noted to have mildly elevated troponin levels.  Echocardiogram did show wall motion abnormalities.  Concern is for Takotsubo cardiomyopathy.  Management per cardiology.  She remains on IV heparin.  Plan is to repeat echocardiogram tomorrow and then decide on further course of action.  She is also noted to be on amiodarone.  Thyroid function tests were normal.  Vertebral compression fractures Patient apparently being managed by Dr. Vira Blanco who is a pain management specialist.  She apparently underwent kyphoplasty to L2 on 12/29/2017.  I did call their office to confirm this.  Apparently plan was to do kyphoplasty of L3 on 9/16 but the patient got hospitalized.  Patient with a significant discomfort to her back.  According to husband and patient she is not able to do anything as a result of this pain.  Will request interventional radiology to see the patient to see if this can be done while she is in the hospital.  Noted to be on steroids.  MRI lumbar spine was done on 01/20/2018.  Acute kidney injury on chronic kidney disease stage 4 Renal function appears to be at baseline currently.  Continue to monitor urine output.  Moderate to severe malnutrition Encourage oral intake.  Mild thrombocytopenia Low platelet counts were noted a few days earlier as well.  Continue to watch closely.  Hypoglycemia Encourage oral intake.  History of diverticulosis and GI bleed in 2018 Not currently an active issue.  Exophytic kidney lesions Outpatient follow-up.  Mild hypernatremia Encourage oral intake.  Right knee pain and swelling Will order x-ray.   DVT Prophylaxis: On IV  heparin    Code Status: DNR Family Communication: Discussed with the patient and her husband Disposition Plan: Management as outlined above. PT/OT Evaluation.    LOS: 4  days   [facility] Pager [phone] 01/30/2018, 11:13 AM  If 7PM-7AM, please contact night-coverage at [email or web address], password [facility]

## 2018-01-30 NOTE — Progress Notes (Addendum)
Progress Note  Patient Name: Heather Benson Date of Encounter: 01/30/2018  Primary Cardiologist: Larae Grooms, MD  Subjective   No CP or SOB. No edema.  Inpatient Medications    Scheduled Meds: . amiodarone  200 mg Oral Daily  . aspirin  325 mg Oral Daily  . atorvastatin  10 mg Oral Daily  . carvedilol  3.125 mg Oral BID WC  . hydrocortisone sod succinate (SOLU-CORTEF) inj  50 mg Intravenous Q24H  . latanoprost  1 drop Both Eyes QHS  . mirtazapine  15 mg Oral QHS   Continuous Infusions: . sodium chloride 10 mL/hr at 01/29/18 0200  . heparin 700 Units/hr (01/30/18 0521)  . piperacillin-tazobactam (ZOSYN)  IV 3.375 g (01/30/18 0511)   PRN Meds: acetaminophen **OR** acetaminophen, ondansetron **OR** ondansetron (ZOFRAN) IV   Vital Signs    Vitals:   01/29/18 2101 01/29/18 2348 01/30/18 0515 01/30/18 0812  BP: 125/72 121/75 135/73 118/63  Pulse: 66 68 69 64  Resp: 20 16 18    Temp: 98.6 F (37 C) 97.6 F (36.4 C) (!) 97.5 F (36.4 C)   TempSrc: Oral Oral Oral   SpO2: 96% 98% 99%   Weight:   73.7 kg   Height:        Intake/Output Summary (Last 24 hours) at 01/30/2018 0927 Last data filed at 01/30/2018 0400 Gross per 24 hour  Intake 480 ml  Output 200 ml  Net 280 ml   Filed Weights   01/28/18 0357 01/29/18 0450 01/30/18 0515  Weight: 75.5 kg 74.3 kg 73.7 kg    Telemetry    NSR - Personally Reviewed   Physical Exam   GEN: No acute distress.  HEENT: Normocephalic, atraumatic, sclera are injected in inner corner L eye Neck: No JVD or bruits. Cardiac: RRR no murmurs, rubs, or gallops.  Radials/DP/PT 1+ and equal bilaterally.  Respiratory: Clear to auscultation bilaterally. Breathing is unlabored. GI: Soft, nontender, non-distended, BS +x 4. MS: no deformity. Extremities: No clubbing or cyanosis. No edema. Distal pedal pulses are 2+ and equal bilaterally. Diffuse ecchymosis with very thin skin Neuro:  AAOx3. Follows commands. Psych:  Responds  to questions appropriately with a normal affect.  Labs    Chemistry Recent Labs  Lab 01/26/18 1303  01/28/18 0605 01/29/18 0458 01/30/18 0550  NA 139   < > 143 144 147*  K 4.7   < > 4.2 3.9 3.6  CL 105   < > 108 108 109  CO2 24   < > 24 30 28   GLUCOSE 108*   < > 103* 106* 79  BUN 22   < > 25* 31* 29*  CREATININE 1.79*   < > 1.52* 1.72* 1.68*  CALCIUM 7.9*   < > 8.5* 8.6* 8.7*  PROT 4.7*  --   --  4.6*  --   ALBUMIN 2.2*   < > 2.1* 2.1* 2.1*  AST 39  --   --  28  --   ALT 38  --   --  30  --   ALKPHOS 113  --   --  103  --   BILITOT 1.1  --   --  0.8  --   GFRNONAA 25*   < > 30* 26* 27*  GFRAA 29*   < > 35* 30* 31*  ANIONGAP 10   < > 11 6 10    < > = values in this interval not displayed.     Hematology Recent Labs  Lab  01/28/18 1034 01/29/18 0458 01/30/18 0550  WBC 11.9* 8.7 7.4  RBC 4.89 4.64 4.58  HGB 14.3 13.7 13.7  HCT 47.0* 45.2 44.9  MCV 96.1 97.4 98.0  MCH 29.2 29.5 29.9  MCHC 30.4 30.3 30.5  RDW 16.5* 16.4* 16.4*  PLT 171 153 135*    Cardiac Enzymes Recent Labs  Lab 01/26/18 1303 01/26/18 1546 01/27/18 0708  TROPONINI 0.91* 0.88* 0.40*    Recent Labs  Lab 01/26/18 0326  TROPIPOC 0.64*     BNP Recent Labs  Lab 01/26/18 0307  BNP 384.8*     DDimer  Recent Labs  Lab 01/26/18 0307  DDIMER 13.53*     Radiology    No results found.  Cardiac Studies   - Left ventricle: The cavity size was normal. Wall thickness was normal. Systolic function was moderately reduced. The estimated ejection fraction was in the range of 35% to 40%. Severe hypokinesis of the mid-apicalanteroseptal, anterior, inferior, and apical myocardium; consistent with ischemia in the distribution of the left anterior descending coronary artery; new since the study of 12/14/2015. Doppler parameters are consistent with abnormal left ventricular relaxation (grade 1 diastolic dysfunction). - Aortic valve: Right coronary cusp mobility was  moderately restricted. There was mild stenosis. There was mild regurgitation directed centrally in the LVOT. Valve area (VTI): 1.55 cm^2. Valve area (Vmax): 1.38 cm^2. Valve area (Vmean): 1.48 cm^2. - Mitral valve: Moderately calcified annulus. There was mild to moderate regurgitation directed centrally. - Left atrium: The atrium was moderately to severely dilated. - Right atrium: The atrium was mildly dilated. - Tricuspid valve: There was moderate regurgitation directed centrally. - Pulmonary arteries: Systolic pressure was moderately increased. PA peak pressure: 59 mm Hg (S).  Impressions:  - New extensive anteroapical wall motion abnormality. Consider LAD ischemia versus stress cardiomyopathy (takotsubo syndrome).  Patient Profile     84 y.o. 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 She was initially felt to have syncope but notes clarified this was instead leg weakness.  Assessment & Plan    1. LV Systolic dysfunction/NSTEMI:Differential diagnosis includes LAD artery ischemia versus Takotsubo syndrome. She does not want to undergo coronary angiography due to the risk ofnephrotoxicity. We are avoiding RAAS inhibitors and spironolactone due to advanced chronic kidney disease.At this point appears euvolemic, but she is at risk for developing congestive heart failure. Continue daily weights. She will remain on IV heparin until kyphoplasty (?9/23) with plan to resume Eliquis thereafter. Per yesterday's note will decrease  ASA to 81mg  daily. [place] treat conservatively and f/u repeat echo this weekend bywhich time there should be improvement ifthis is stress cardiomyopathy. Echo ordered for 9/21 and will need follow-up by weekend team. Mild thrombocytopenia/hypernatremia noted today, further management by primary team. I encouraged oral intake of water today. Weights have been downtrending so no evidence of volume overload.  2. Sepsis with PNA concerning for aspiration:improving.  3.Paroxysmal Afib:Maintaining sinus rhythm,onintravenous heparin and amiodarone. Thyroid labs normal.  4.HTN: normal BP.  5.HLD:Continue lipitor;LDL near goal at 73 with goal <70.  6.CKD IV: she has previously discussed option for hemodialysis with Dr. Lorrene Reid and has decided she will never go on dialysis. Baseline Cr  appears variable, but somewhere in realm of 1.9-2.2. Stable today.  7. GYF:[facility] admission's echo shows moderate pulmonary artery hypertension despite no evidence of elevation left heart filling pressures, suggesting underlying chronic pulmonaryarteriolar disease, probably exacerbated by the acute pulmonary illness.  Will defer to IM whether some form of PT would be appropriate prior to her surgery to mobilize so she does not become progressively weaker.  For questions or updates, please contact [place] Please consult [email or web address] for contact info under Cardiology/STEMI.  Signed, Charlie Pitter, PA-C 01/30/2018, 9:27 AM    I have seen and examined the patient along with Charlie Pitter, PA-C.  I have reviewed the chart, notes and new data.  I agree with PA/NP's note.  Key new complaints: Still crying and upset about not being able to go home, but overall I think she is doing better than yesterday. Key examination changes: Normal cardiovascular exam without evidence of hypervolemia and without significant arrhythmia Key new findings / data: Echo scheduled for tomorrow, 5 days from the diagnosis of  stress cardiomyopathy versus non-STEMI.  PLAN: If the echo shows substantial improvement or normalization of LVEF I would consider this to most likely have been an episode of Takotsubo syndrome and I think she could go ahead with her planned kyphoplasty.  She would like that done at the hospital before she is discharged, if logistically this is an option. If her echocardiogram does not show any improvement left ventricular systolic function, its possible that we simply have not waited long enough, but it may be a signal that she truly has coronary disease.  She reiterated her fear of renal failure and need for dialysis and prefers noninvasive management.  Sanda Klein, MD, [place] [phone] 01/30/2018, 12:21 PM

## 2018-01-31 ENCOUNTER — Inpatient Hospital Stay (HOSPITAL_COMMUNITY): Payer: Medicare Other

## 2018-01-31 DIAGNOSIS — M25561 Pain in right knee: Secondary | ICD-10-CM

## 2018-01-31 LAB — CULTURE, BLOOD (ROUTINE X 2)
CULTURE: NO GROWTH
Culture: NO GROWTH
SPECIAL REQUESTS: ADEQUATE

## 2018-01-31 LAB — CBC
HCT: 45.8 % (ref 36.0–46.0)
HEMOGLOBIN: 13.9 g/dL (ref 12.0–15.0)
MCH: 29.6 pg (ref 26.0–34.0)
MCHC: 30.3 g/dL (ref 30.0–36.0)
MCV: 97.7 fL (ref 78.0–100.0)
Platelets: 129 10*3/uL — ABNORMAL LOW (ref 150–400)
RBC: 4.69 MIL/uL (ref 3.87–5.11)
RDW: 16.4 % — ABNORMAL HIGH (ref 11.5–15.5)
WBC: 7.1 10*3/uL (ref 4.0–10.5)

## 2018-01-31 LAB — BASIC METABOLIC PANEL
ANION GAP: 8 (ref 5–15)
BUN: 27 mg/dL — ABNORMAL HIGH (ref 8–23)
CO2: 29 mmol/L (ref 22–32)
Calcium: 8.7 mg/dL — ABNORMAL LOW (ref 8.9–10.3)
Chloride: 107 mmol/L (ref 98–111)
Creatinine, Ser: 1.77 mg/dL — ABNORMAL HIGH (ref 0.44–1.00)
GFR, EST AFRICAN AMERICAN: 29 mL/min — AB (ref >60)
GFR, EST NON AFRICAN AMERICAN: 25 mL/min — AB (ref >60)
Glucose, Bld: 98 mg/dL (ref 70–99)
POTASSIUM: 3.9 mmol/L (ref 3.5–5.1)
SODIUM: 144 mmol/L (ref 135–145)

## 2018-01-31 LAB — GLUCOSE, CAPILLARY
GLUCOSE-CAPILLARY: 94 mg/dL (ref 70–99)
GLUCOSE-CAPILLARY: 79 mg/dL (ref 70–99)
GLUCOSE-CAPILLARY: 79 mg/dL (ref 70–99)
Glucose-Capillary: 120 mg/dL — ABNORMAL HIGH (ref 70–99)
Glucose-Capillary: 135 mg/dL — ABNORMAL HIGH (ref 70–99)

## 2018-01-31 LAB — URIC ACID: URIC ACID, SERUM: 5.9 mg/dL (ref 2.5–7.1)

## 2018-01-31 LAB — ECHOCARDIOGRAM LIMITED
Height: 64 in
WEIGHTICAEL: 2675.2 [oz_av]

## 2018-01-31 LAB — HEPARIN LEVEL (UNFRACTIONATED): HEPARIN UNFRACTIONATED: 0.33 [IU]/mL (ref 0.30–0.70)

## 2018-01-31 LAB — APTT: APTT: 68 s — AB (ref 24–36)

## 2018-01-31 NOTE — Plan of Care (Signed)
  Problem: Nutrition: Goal: Adequate nutrition will be maintained Outcome: Progressing   

## 2018-01-31 NOTE — Progress Notes (Signed)
[place] for Heparin Indication: Atrial fibrillation  Allergies  Allergen Reactions  . Augmentin [Amoxicillin-Pot Clavulanate] Diarrhea and Nausea Only  . Boniva [Ibandronic Acid] Diarrhea  . Codeine Other (See Comments)    "Spaced out feeling" and Lightheadedness  . Hydromorphone Nausea And Vomiting  . Latex Rash    "Burns me"  . Lisinopril Cough  . Adhesive [Tape] Itching and Rash    Patient Measurements: Height: 5\' 4"  (162.6 cm) Weight: 167 lb 3.2 oz (75.8 kg) IBW/kg (Calculated) : 54.7 Heparin Dosing Weight: 70.5  Vital Signs: Temp: 98 F (36.7 C) (09/21 0434) Temp Source: Oral (09/21 0434) BP: 115/72 (09/21 0434) Pulse Rate: 65 (09/21 0800)  Labs: Recent Labs    01/29/18 0458 01/30/18 0550 01/31/18 0450  HGB 13.7 13.7 13.9  HCT 45.2 44.9 45.8  PLT 153 135* 129*  APTT 72* 64* 68*  HEPARINUNFRC 0.41 0.35 0.33  CREATININE 1.72* 1.68* 1.77*    Estimated Creatinine Clearance: 23.6 mL/min (A) (by C-G formula based on SCr of 1.77 mg/dL (H)).  Assessment: 84 yo female with PAF on apixaban PTA. Apixaban on hold since 9/13 AM for kyphoplasty on 9/23. Pharmacy has been consulted to dose heparin drip for NSTEMI and pending kyphoplasty surgery.  Heparin level and aPTT remain therapeutic and correlating (Apixaban no longer affecting heparin levels). CBC stable, no bleeding documented.  Goal of Therapy:  Heparin level 0.3-0.7 units/ml Monitor platelets by anticoagulation protocol: Yes  Plan:  -Continue heparin at 700 units/hr -Monitor daily heparin level and CBC, s/sx bleeding -F/u Cardiology w/u, plans to resume apixaban as appropriate post-op kyphoplasty (scheduled for 9/23)  Elicia Lamp, PharmD, BCPS Clinical Pharmacist Clinical phone [phone] Please check AMION for all [place] contact numbers 01/31/2018 11:54 AM

## 2018-01-31 NOTE — Progress Notes (Signed)
  Echocardiogram 2D Echocardiogram has been performed.  Jennette Dubin 01/31/2018, 9:59 AM

## 2018-01-31 NOTE — Progress Notes (Signed)
[facility] PROGRESS NOTE  KEERSTIN BJELLAND HYQ:[id number] DOB: 1932-04-09 DOA: 01/26/2018  PCP: [name], MD  Brief History/Interval Summary: 89 WF Paroxysmal atrial fibrillation/AVNRT--prior amiodarone-- on Eliquis [off of this since 9/16]--DCCV 07/2014.  So has history of diastolic CHF, chronic kidney disease stage IV, chronic steroid use, recent compression fracture status post kyphoplasty x1 on 8/19 to L2.  Another kyphoplasty was planned for 9/16 to L3 but patient got hospitalized.  She presented with lightheadedness hypotension x-ray findings suggesting pneumonia.  She was hospitalized.  Subsequently noted to have elevated troponin.  Cardiology was consulted.  Consultants: Cardiology.  Interventional radiology.  Orthopedics.  Procedures:   Transthoracic echocardiogram 01/27/18 Study Conclusions  - Left ventricle: The cavity size was normal. Wall thickness was   normal. Systolic function was moderately reduced. The estimated   ejection fraction was in the range of 35% to 40%. Severe   hypokinesis of the mid-apicalanteroseptal, anterior, inferior,   and apical myocardium; consistent with ischemia in the   distribution of the left anterior descending coronary artery; new   since the study of 12/14/2015. Doppler parameters are consistent   with abnormal left ventricular relaxation (grade 1 diastolic   dysfunction). - Aortic valve: Right coronary cusp mobility was moderately   restricted. There was mild stenosis. There was mild regurgitation   directed centrally in the LVOT. Valve area (VTI): 1.55 cm^2.   Valve area (Vmax): 1.38 cm^2. Valve area (Vmean): 1.48 cm^2. - Mitral valve: Moderately calcified annulus. There was mild to   moderate regurgitation directed centrally. - Left atrium: The atrium was moderately to severely dilated. - Right atrium: The atrium was mildly dilated. - Tricuspid valve: There was moderate regurgitation directed   centrally. - Pulmonary  arteries: Systolic pressure was moderately increased.   PA peak pressure: 59 mm Hg (S).  Impressions:  - New extensive anteroapical wall motion abnormality. Consider LAD   ischemia versus stress cardiomyopathy (takotsubo syndrome).   Antibiotics: Patient was on Cefdinir initially.  Currently on Zosyn.  Subjective/Interval History: Patient states that she is feeling better.  Denies any chest pain or shortness of breath.  Back pain is stable.  Continues to have right knee pain.    ROS: Denies any headaches  Objective:  Vital Signs  Vitals:   01/30/18 1228 01/30/18 1943 01/30/18 2349 01/31/18 0434  BP: 117/65 124/64 117/64 115/72  Pulse: 65 73 75 72  Resp:  (!) 21 19 19   Temp: 98.2 F (36.8 C) 98 F (36.7 C) 98.1 F (36.7 C) 98 F (36.7 C)  TempSrc: Oral Oral Oral Oral  SpO2: 97% 96% 97% 97%  Weight:    75.8 kg  Height:        Intake/Output Summary (Last 24 hours) at 01/31/2018 0918 Last data filed at 01/30/2018 2126 Gross per 24 hour  Intake 120 ml  Output 75 ml  Net 45 ml   Filed Weights   01/29/18 0450 01/30/18 0515 01/31/18 0434  Weight: 74.3 kg 73.7 kg 75.8 kg    General appearance: Awake alert.  In no distress Resp: Clear to auscultation bilaterally.  No wheezing rales or rhonchi Cardio: S1-S2 is normal regular.  No S3-S4.  No rubs murmurs or bruit GI: Abdomen remains soft.  Nontender nondistended.  Bowel sounds are present.  No masses organomegaly Extremities: Restricted range of motion of the right knee.  Tender.  Mildly swollen.  Neurologic: Alert and oriented x3.  No focal neurological deficits.    Lab Results:  Data Reviewed:  I have personally reviewed following labs and imaging studies  CBC: Recent Labs  Lab 01/26/18 1016 01/27/18 0708 01/28/18 0605 01/28/18 1034 01/29/18 0458 01/30/18 0550 01/31/18 0450  WBC 11.9* 12.5* 11.4* 11.9* 8.7 7.4 7.1  NEUTROABS 9.8* 10.1* 9.4* 9.8*  --  4.9  --   HGB 14.3 14.7 13.6 14.3 13.7 13.7 13.9  HCT  48.5* 50.7* 43.7 47.0* 45.2 44.9 45.8  MCV 99.8 102.6* 96.0 96.1 97.4 98.0 97.7  PLT 147* 133* 145* 171 153 135* 129*    Basic Metabolic Panel: Recent Labs  Lab 01/27/18 0708 01/28/18 0605 01/29/18 0458 01/30/18 0550 01/31/18 0450  NA 143 143 144 147* 144  K 4.5 4.2 3.9 3.6 3.9  CL 108 108 108 109 107  CO2 18* 24 30 28 29   GLUCOSE 64* 103* 106* 79 98  BUN 23 25* 31* 29* 27*  CREATININE 1.66* 1.52* 1.72* 1.68* 1.77*  CALCIUM 9.0 8.5* 8.6* 8.7* 8.7*  MG 2.0 2.2  --   --   --   PHOS 3.9 2.9  --  2.6  --     GFR: Estimated Creatinine Clearance: 23.6 mL/min (A) (by C-G formula based on SCr of 1.77 mg/dL (H)).  Liver Function Tests: Recent Labs  Lab 01/26/18 1303 01/27/18 0708 01/28/18 0605 01/29/18 0458 01/30/18 0550  AST 39  --   --  28  --   ALT 38  --   --  30  --   ALKPHOS 113  --   --  103  --   BILITOT 1.1  --   --  0.8  --   PROT 4.7*  --   --  4.6*  --   ALBUMIN 2.2* 2.2* 2.1* 2.1* 2.1*     Coagulation Profile: Recent Labs  Lab 01/26/18 1016  INR 1.07    Cardiac Enzymes: Recent Labs  Lab 01/26/18 1303 01/26/18 1546 01/27/18 0708  TROPONINI 0.91* 0.88* 0.40*    CBG: Recent Labs  Lab 01/30/18 1616 01/30/18 1941 01/30/18 2347 01/31/18 0432 01/31/18 0732  GLUCAP 112* 124* 121* 94 79     Thyroid Function Tests: Recent Labs    01/30/18 0550  TSH 0.597  FREET4 1.60     Recent Results (from the past 240 hour(s))  Blood Culture (routine x 2)     Status: None   Collection Time: 01/26/18  3:55 AM  Result Value Ref Range Status   Specimen Description BLOOD RIGHT ANTECUBITAL  Final   Special Requests   Final    BOTTLES DRAWN AEROBIC AND ANAEROBIC Blood Culture adequate volume   Culture   Final    NO GROWTH 5 DAYS Performed at [facility], [place] [address], [place], [place] [address]    Report Status 01/31/2018 FINAL  Final  Blood Culture (routine x 2)     Status: None   Collection Time: 01/26/18  4:03 AM  Result Value Ref  Range Status   Specimen Description BLOOD LEFT FOREARM  Final   Special Requests   Final    BOTTLES DRAWN AEROBIC AND ANAEROBIC Blood Culture results may not be optimal due to an excessive volume of blood received in culture bottles   Culture   Final    NO GROWTH 5 DAYS Performed at [facility], [place] [address] [place], [place] [address]    Report Status 01/31/2018 FINAL  Final  MRSA PCR Screening     Status: None   Collection Time: 01/26/18  5:56 PM  Result Value Ref Range Status   MRSA by PCR NEGATIVE NEGATIVE Final    Comment:        The GeneXpert MRSA Assay (FDA approved for NASAL specimens only), is one component of a comprehensive MRSA colonization surveillance program. It is not intended to diagnose MRSA infection nor to guide or monitor treatment for MRSA infections. Performed at [facility], [place] [address], [place], [place] [address]       Radiology Studies: Dg Knee Right Port  Result Date: 01/30/2018 CLINICAL DATA:  Pain and swelling.  Total knee arthroplasty. EXAM: PORTABLE RIGHT KNEE - 1-2 VIEW COMPARISON:  Radiographs dated 05/27/2010 FINDINGS: There is no fracture or dislocation. There is a joint effusion. No evidence of loosening of the components of the total knee prosthesis. Osteopenia. IMPRESSION: Joint effusion.  No other acute abnormality. Electronically Signed   By: Lorriane Shire M.D.   On: 01/30/2018 12:05     Medications:  Scheduled: . amiodarone  200 mg Oral Daily  . aspirin EC  81 mg Oral Daily  . atorvastatin  10 mg Oral Daily  . carvedilol  3.125 mg Oral BID WC  . hydrocortisone sod succinate (SOLU-CORTEF) inj  50 mg Intravenous Q24H  . latanoprost  1 drop Both Eyes QHS  . mirtazapine  15 mg Oral QHS   Continuous: . sodium chloride 10 mL/hr at 01/29/18 0200  . heparin 700 Units/hr (01/30/18 0521)  . piperacillin-tazobactam (ZOSYN)  IV 3.375 g (01/31/18 0513)   UXL:[id number] **OR** acetaminophen, ondansetron **OR**  ondansetron (ZOFRAN) IV  Assessment/Plan:    Pneumonia involving right lung concerning for aspiration Seen by speech therapy.  On a regular diet.  Patient was initially on oral antibiotics.  Changed over to Zosyn on 9/19.  Respiratory status is stable.  Continue to wean down oxygen.  Paroxysmal atrial fibrillation/AVNRT/chronic combined systolic and diastolic CHF Previously on anticoagulation with apixaban.  Was held recently so that she can undergo kyphoplasty.  Echocardiogram shows low EF compared to previous.  Cardiology is following.  Patient also noted to have mildly elevated troponin levels.  Echocardiogram did show wall motion abnormalities.  Concern is for Takotsubo cardiomyopathy.  Management per cardiology.  She remains on IV heparin.  Plan is to repeat echocardiogram today and decide on further course of action.  She is also noted to be on amiodarone.  Thyroid function tests were normal.  Vertebral compression fractures Patient apparently being managed by Dr. Vira Blanco who is a pain management specialist.  MRI lumbar spine was done on 9/10.  She apparently underwent kyphoplasty to L2 on 12/29/2017.  Plan was to do kyphoplasty of L3 on 9/16 but the patient got hospitalized.  Patient with a significant discomfort to her back.  According to husband and patient she is not able to do anything as a result of this pain.  Interventional radiology was consulted.  Appreciate their assistance.  Tentative plan is for kyphoplasty on 9/23.  Patient was placed on steroids as well.  Acute kidney injury on chronic kidney disease stage 4 Creatinine fluctuating slightly but mostly close to baseline.  Continue to monitor urine output.  Avoid nephrotoxic agents.  Moderate to severe malnutrition Encourage oral intake.  Mild thrombocytopenia Platelet counts mildly low.  Continue to monitor for now.  No evidence for bleeding.    Hypoglycemia Encourage oral intake.  History of diverticulosis and GI bleed in  2018 Not currently an active issue.  Exophytic kidney lesions Outpatient follow-up.  Mild hypernatremia Encourage oral  intake.  Right knee pain and swelling X-ray did not show any fractures.  It showed joint effusion.  Previous history of arthroplasty in the joint.  Discussed with Dr. Stann Mainland with orthopedics who will consult on this patient.   DVT Prophylaxis: On IV heparin    Code Status: DNR Family Communication: Discussed with the patient and her husband Disposition Plan: Management as outlined above.  Echocardiogram today.  Await further cardiology input.  Kyphoplasty tentatively planned for Monday 9/23.   LOS: 5 days   [facility] Pager [phone] 01/31/2018, 9:18 AM  If 7PM-7AM, please contact night-coverage at [email or web address], password [facility]

## 2018-01-31 NOTE — Evaluation (Signed)
Occupational Therapy Evaluation Patient Details Name: Heather Benson MRN: [id number] DOB: Oct 23, 1931 Today's Date: 01/31/2018    History of Present Illness Patient is a 84 y/o female who presents with lightheadedness and weakness after being lowered onto floor from spouse. CXR-showing infiltrates concerning for pneumonia. Also with sepsis. x-ray of Rt knee showed no fracture or dislocation.  There is a joint effusion.  Plan for kyphoplasty prior to admission.  PMH includes HTN, CKD stage III, shingles, paroxysmal atrial fibrillation   Clinical Impression   Pt admitted with above. She demonstrates the below listed deficits and will benefit from continued OT to maximize safety and independence with BADLs.  Pt severely limited by Rt knee and back pain - unable to even roll fully due to pain.   She requires mod - total A for ADLs at bed level. She requires max encouragement to participate in exercise due to c/o pain.  Encouraged low repetitions frequently to improve mobility and strength in prep for ADLs.  PTA, she lived with spouse and required assist for ADLs.  Anticipate she is going to require SNF level rehab.       Follow Up Recommendations  SNF;Supervision/Assistance - 24 hour    Equipment Recommendations  None recommended by OT    Recommendations for Other Services       Precautions / Restrictions Precautions Precautions: Fall      Mobility Bed Mobility Overal bed mobility: Needs Assistance Bed Mobility: Rolling           General bed mobility comments: attempted to roll, she required max A to roll partially to Lt. She was unable to complete rolling due to pain   Transfers                 General transfer comment: unable to attempt due to pain.  Bed moved into partial chair position, but she only tolerated HOB to 32*     Balance                                           ADL either performed or assessed with clinical judgement   ADL  Overall ADL's : Needs assistance/impaired Eating/Feeding: Set up;Bed level   Grooming: Wash/dry hands;Wash/dry face;Oral care;Brushing hair;Moderate assistance;Bed level   Upper Body Bathing: Moderate assistance;Bed level   Lower Body Bathing: Total assistance;Bed level   Upper Body Dressing : Total assistance;Bed level   Lower Body Dressing: Total assistance;Bed level   Toilet Transfer: Total assistance Toilet Transfer Details (indicate cue type and reason): uanble to atttempt due to pain  Toileting- Clothing Manipulation and Hygiene: Total assistance;Bed level       Functional mobility during ADLs: Maximal assistance;Total assistance(bed mobility ) General ADL Comments: Pt limited by pain      Vision         Perception     Praxis      Pertinent Vitals/Pain Pain Assessment: Faces Faces Pain Scale: Hurts whole lot Pain Location: back and Rt knee with movement  Pain Descriptors / Indicators: Discomfort;Grimacing;Guarding Pain Intervention(s): Limited activity within patient's tolerance;Monitored during session;Repositioned     Hand Dominance Right   Extremity/Trunk Assessment Upper Extremity Assessment Upper Extremity Assessment: Generalized weakness   Lower Extremity Assessment Lower Extremity Assessment: Defer to PT evaluation   Cervical / Trunk Assessment Cervical / Trunk Assessment: Kyphotic;Other exceptions Cervical / Trunk Exceptions: L1 compression fx  Communication Communication Communication: No difficulties   Cognition Arousal/Alertness: Awake/alert Behavior During Therapy: Anxious;WFL for tasks assessed/performed Overall Cognitive Status: Impaired/Different from baseline Area of Impairment: Memory                     Memory: Decreased short-term memory         General Comments: Pt provides conflicting into than that of family    General Comments  spouse present     Exercises Exercises: General Upper Extremity;General Lower  Extremity;Other exercises General Exercises - Upper Extremity Shoulder Flexion: AROM;Right;Left;10 reps;Supine General Exercises - Lower Extremity Heel Slides: AAROM;Right;Left;10 reps;Supine Straight Leg Raises: Right;Left;10 reps;Supine Other Exercises Other Exercises: pt complains of pain after 3-4 reps of each exercise.  Encouraged her to perform above 3-4 reps at a time with rest breaks and then continue until she has completed 10 - encouraged her to do this every 1-2 hours    Shoulder Instructions      Home Living Family/patient expects to be discharged to:: Private residence Living Arrangements: Spouse/significant other Available Help at Discharge: Family;Available 24 hours/day Type of Home: House Home Access: Level entry     Home Layout: One level     Bathroom Shower/Tub: Occupational psychologist: Handicapped height Bathroom Accessibility: Yes How Accessible: Accessible via walker Home Equipment: Pillow - 2 wheels;Cane - single point;Hand held shower head;Grab bars - tub/shower;Shower seat - built in   Additional Comments: use SPC for stability when she remembered (reports frequently forgetting to use it)      Prior Functioning/Environment Level of Independence: Needs assistance  Gait / Transfers Assistance Needed: Uses RW or husband for ambulation support.  ADL's / Homemaking Assistance Needed: per spouse, pt requires assist with all aspect of ADLs, but she is able to assist some.             OT Problem List: Decreased strength;Decreased range of motion;Decreased activity tolerance;Impaired balance (sitting and/or standing);Decreased cognition;Decreased safety awareness;Decreased knowledge of use of DME or AE;Cardiopulmonary status limiting activity;Obesity;Pain      OT Treatment/Interventions: Self-care/ADL training;Therapeutic exercise;DME and/or AE instruction;Therapeutic activities;Cognitive remediation/compensation;Patient/family education;Balance  training    OT Goals(Current goals can be found in the care plan section) Acute Rehab OT Goals Patient Stated Goal: to have less pain  OT Goal Formulation: With patient/family Time For Goal Achievement: 02/14/18 Potential to Achieve Goals: Fair ADL Goals Pt Will Perform Grooming: with min assist;sitting Pt Will Perform Upper Body Bathing: with min assist;sitting Pt Will Perform Lower Body Bathing: with mod assist;sit to/from stand Pt Will Transfer to Toilet: with max assist;squat pivot transfer;bedside commode Additional ADL Goal #1: Pt will tolerate EOB sitting x 8 mins in prep for ADLs  OT Frequency: Min 2X/week   Barriers to D/C:    spouse unable to provide current level of care        Co-evaluation              AM-PAC PT "6 Clicks" Daily Activity     Outcome Measure Help from another person eating meals?: A Little Help from another person taking care of personal grooming?: A Little Help from another person toileting, which includes using toliet, bedpan, or urinal?: Total Help from another person bathing (including washing, rinsing, drying)?: A Lot Help from another person to put on and taking off regular upper body clothing?: Total Help from another person to put on and taking off regular lower body clothing?: Total 6 Click Score: 11   End of  Session Equipment Utilized During Treatment: Oxygen  Activity Tolerance: Patient limited by pain Patient left: in bed;with call bell/phone within reach  OT Visit Diagnosis: Pain;Muscle weakness (generalized) (M62.81) Pain - Right/Left: Right Pain - part of body: Knee                Time: 2548-6282 OT Time Calculation (min): 27 min Charges:  OT General Charges $OT Visit: 1 Visit OT Evaluation $OT Eval Moderate Complexity: 1 Mod OT Treatments $Therapeutic Activity: 8-22 mins  Lucille Passy, OTR/L Acute Rehabilitation Services Pager [phone] Office [phone]   Lucille Passy M 01/31/2018, 6:46 PM

## 2018-01-31 NOTE — Progress Notes (Signed)
Progress Note  Patient Name: Heather Benson Date of Encounter: 01/31/2018  Primary Cardiologist: Larae Grooms, MD  Subjective   No CP   No SOB    Inpatient Medications    Scheduled Meds: . amiodarone  200 mg Oral Daily  . aspirin EC  81 mg Oral Daily  . atorvastatin  10 mg Oral Daily  . carvedilol  3.125 mg Oral BID WC  . hydrocortisone sod succinate (SOLU-CORTEF) inj  50 mg Intravenous Q24H  . latanoprost  1 drop Both Eyes QHS  . mirtazapine  15 mg Oral QHS   Continuous Infusions: . sodium chloride 10 mL/hr at 01/29/18 0200  . heparin 700 Units/hr (01/30/18 0521)  . piperacillin-tazobactam (ZOSYN)  IV 3.375 g (01/31/18 0513)   PRN Meds: acetaminophen **OR** acetaminophen, ondansetron **OR** ondansetron (ZOFRAN) IV   Vital Signs    Vitals:   01/30/18 1943 01/30/18 2349 01/31/18 0434 01/31/18 0800  BP: 124/64 117/64 115/72   Pulse: 73 75 72 65  Resp: (!) 21 19 19 18   Temp: 98 F (36.7 C) 98.1 F (36.7 C) 98 F (36.7 C)   TempSrc: Oral Oral Oral   SpO2: 96% 97% 97% 93%  Weight:   75.8 kg   Height:        Intake/Output Summary (Last 24 hours) at 01/31/2018 1303 Last data filed at 01/31/2018 1100 Gross per 24 hour  Intake 340 ml  Output 75 ml  Net 265 ml   Filed Weights   01/29/18 0450 01/30/18 0515 01/31/18 0434  Weight: 74.3 kg 73.7 kg 75.8 kg    Telemetry    NSR - Personally Reviewed   Physical Exam   GEN: No acute distress.  HEENT: Normocephalic, atraumatic,  Neck: No JVD or bruits. Cardiac: RRR no murmurs, rubs, or gallops.  Radials/DP/PT 1+ and equal bilaterally.  Respiratory: Clear to auscultation bilaterally. Breathing is unlabored. GI: Soft, nontender, non-distended, BS +x 4. MS: no deformity. Extremities: No clubbing or cyanosis. No edema. Distal pedal pulses are 2+ and equal bilaterally. Diffuse ecchymosis with very thin skin Neuro:  AAOx3. Follows commands. Psych:  Responds to questions appropriately with a normal  affect.  Labs    Chemistry Recent Labs  Lab 01/26/18 1303  01/28/18 0605 01/29/18 0458 01/30/18 0550 01/31/18 0450  NA 139   < > 143 144 147* 144  K 4.7   < > 4.2 3.9 3.6 3.9  CL 105   < > 108 108 109 107  CO2 24   < > 24 30 28 29   GLUCOSE 108*   < > 103* 106* 79 98  BUN 22   < > 25* 31* 29* 27*  CREATININE 1.79*   < > 1.52* 1.72* 1.68* 1.77*  CALCIUM 7.9*   < > 8.5* 8.6* 8.7* 8.7*  PROT 4.7*  --   --  4.6*  --   --   ALBUMIN 2.2*   < > 2.1* 2.1* 2.1*  --   AST 39  --   --  28  --   --   ALT 38  --   --  30  --   --   ALKPHOS 113  --   --  103  --   --   BILITOT 1.1  --   --  0.8  --   --   GFRNONAA 25*   < > 30* 26* 27* 25*  GFRAA 29*   < > 35* 30* 31* 29*  ANIONGAP 10   < >  11 6 10 8    < > = values in this interval not displayed.     Hematology Recent Labs  Lab 01/29/18 0458 01/30/18 0550 01/31/18 0450  WBC 8.7 7.4 7.1  RBC 4.64 4.58 4.69  HGB 13.7 13.7 13.9  HCT 45.2 44.9 45.8  MCV 97.4 98.0 97.7  MCH 29.5 29.9 29.6  MCHC 30.3 30.5 30.3  RDW 16.4* 16.4* 16.4*  PLT 153 135* 129*    Cardiac Enzymes Recent Labs  Lab 01/26/18 1303 01/26/18 1546 01/27/18 0708  TROPONINI 0.91* 0.88* 0.40*    Recent Labs  Lab 01/26/18 0326  TROPIPOC 0.64*     BNP Recent Labs  Lab 01/26/18 0307  BNP 384.8*     DDimer  Recent Labs  Lab 01/26/18 0307  DDIMER 13.53*     Radiology    Dg Knee Right Port  Result Date: 01/30/2018 CLINICAL DATA:  Pain and swelling.  Total knee arthroplasty. EXAM: PORTABLE RIGHT KNEE - 1-2 VIEW COMPARISON:  Radiographs dated 05/27/2010 FINDINGS: There is no fracture or dislocation. There is a joint effusion. No evidence of loosening of the components of the total knee prosthesis. Osteopenia. IMPRESSION: Joint effusion.  No other acute abnormality. Electronically Signed   By: Lorriane Shire M.D.   On: 01/30/2018 12:05    Cardiac Studies   - Left ventricle: The cavity size was normal. Wall thickness was normal. Systolic  function was moderately reduced. The estimated ejection fraction was in the range of 35% to 40%. Severe hypokinesis of the mid-apicalanteroseptal, anterior, inferior, and apical myocardium; consistent with ischemia in the distribution of the left anterior descending coronary artery; new since the study of 12/14/2015. Doppler parameters are consistent with abnormal left ventricular relaxation (grade 1 diastolic dysfunction). - Aortic valve: Right coronary cusp mobility was moderately restricted. There was mild stenosis. There was mild regurgitation directed centrally in the LVOT. Valve area (VTI): 1.55 cm^2. Valve area (Vmax): 1.38 cm^2. Valve area (Vmean): 1.48 cm^2. - Mitral valve: Moderately calcified annulus. There was mild to moderate regurgitation directed centrally. - Left atrium: The atrium was moderately to severely dilated. - Right atrium: The atrium was mildly dilated. - Tricuspid valve: There was moderate regurgitation directed centrally. - Pulmonary arteries: Systolic pressure was moderately increased. PA peak pressure: 59 mm Hg (S).  Impressions:  - New extensive anteroapical wall motion abnormality. Consider LAD ischemia versus stress cardiomyopathy (takotsubo syndrome).  Patient Profile     84 y.o. 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 She was initially felt to have syncope but notes clarified this was instead leg weakness.  Assessment & Plan    1. LV Systolic  dysfunction/NSTEMI:Differential diagnosis includes LAD artery ischemia versus Takotsubo syndrome. She does not want to undergo coronary angiography due to the risk ofnephrotoxicity. We are avoiding RAAS inhibitors and spironolactone due to advanced chronic kidney disease. She will remain on IV heparin until kyphoplasty (?9/23) with plan to resume Eliquis thereafter. [place] treat conservatively and f/.  Echo pending   2. Sepsis with PNA concerning for aspiration:improving.  3.Paroxysmal Afib:Maintaining sinus rhythm,onintravenous heparin and amiodarone. Thyroid labs normal.  4.HTN: normal BP.  5.HLD:Continue lipitor;LDL near goal at 34  with goal <70.  6.CKD IV: she has previously discussed option for hemodialysis with Dr. Lorrene Reid and has decided she will never go on dialysis.  7. KWI:OXBD admission's echo shows moderate pulmonary artery hypertension despite no evidence of elevation left heart filling pressures, suggesting underlying chronic pulmonaryarteriolar disease, probably exacerbated by the acute pulmonary illness.  . For questions or updates, please contact [place] Please consult [email or web address] for contact info under Cardiology/STEMI.  Signed, Dorris Carnes, MD 01/31/2018, 1:03 PM

## 2018-01-31 NOTE — Consult Note (Signed)
Heather Benson is an 84 y.o. female.    Chief Complaint: right knee pain  HPI: exam right knee s/p recent fall due to weakness and hypotension. Pt reports her knee was trapped underneath her when she fell. Previous right total knee arthroplasty by Dr. Wynelle Link. Co pain with leg activation and movement. Currently using ice and resting the knee. No pain at rest. Denies any previous issues with the right knee s/p replacement. Currently being treated for pneumonia and vertebral compressions fractures.   PCP:  [name], MD  PMH: Past Medical History:  Diagnosis Date  . Aortic atherosclerosis ([place]) 12/25/2016  . Aortic stenosis, mild 12/14/2015  . Arthritis 08-28-11   right knee pain-surgery planned  . Cancer [facility]) 08-28-11   Melonoma-cheek(many Yrs ago) , recent bx. left  face lesion, past skin cancer lesions  . CAP (community acquired pneumonia) 12/13/2015  . CHF (congestive heart failure) ([place])   . Complication of anesthesia 08-28-11   in past arrythmia-  cardiology has seen in past  . Dysrhythmia 08-28-11   only immed.  to several days after surgery- hx. A.fib. in past  . GERD (gastroesophageal reflux disease) 08-28-11   tx. omeprazole  . Hypercholesterolemia   . Hypertension 08-28-11   tx. meds  . IBS (irritable bowel syndrome)   . Lymphedema   . Neuromuscular disorder ([place]) 08-28-11   hx. Polio age 14-slight residual affects legs  . Osteoarthritis    back and left knee  . Osteoporosis   . Pneumonia 12/2015   hospital x 3 days  . PONV (postoperative nausea and vomiting)   . Renal cyst, right   . Sarcoidosis   . Swelling of extremity 08-28-11   bilateral lower extremities- mid calf down    PSes:  Allergies  Allergen Reactions  . Augmentin [Amoxicillin-Pot Clavulanate] Diarrhea and Nausea Only  . Boniva [Ibandronic Acid] Diarrhea  . Codeine Other (See Comments)    "Spaced out feeling" and Lightheadedness  . Hydromorphone Nausea And Vomiting  . Latex Rash    "Burns  me"  . Lisinopril Cough  . Adhesive [Tape] Itching and Rash    Medications: Current Facility-Administered Medications  Medication Dose Route Frequency Provider Last Rate Last Dose  . 0.9 %  sodium chloride infusion   Intravenous Continuous Nita Sells, MD 10 mL/hr at 01/29/18 0200    . acetaminophen (TYLENOL) tablet 650 mg  650 mg Oral Q6H PRN Rise Patience, MD       Or  . acetaminophen (TYLENOL) suppository 650 mg  650 mg Rectal Q6H PRN Rise Patience, MD      . amiodarone (PACERONE) tablet 200 mg  200 mg Oral Daily Rise Patience, MD   200 mg at 01/31/18 1031  . aspirin EC tablet 81 mg  81 mg Oral Daily Dunn, Dayna N, PA-C   81 mg at 01/31/18 1031  . atorvastatin (LIPITOR) tablet 10 mg  10 mg Oral Daily Rise Patience, MD   10 mg at 01/31/18 1031  . carvedilol (COREG) tablet 3.125 mg  3.125 mg Oral BID WC Croitoru, Mihai, MD   3.125 mg at 01/31/18 1031  . heparin ADULT infusion 100 units/mL (25000 units/283m sodium chloride 0.45%)  700 Units/hr Intravenous Continuous DJohnnette GourdD, RPH 7 mL/hr at 01/30/18 0521 700 Units/hr at 01/30/18 0521  . hydrocortisone sodium succinate (SOLU-CORTEF) 100 MG injection 50 mg  50 mg Intravenous Q24H SNita Sells MD   50 mg at 01/30/18 1503  . latanoprost (  XALATAN) 0.005 % ophthalmic solution 1 drop  1 drop Both Eyes QHS Rise Patience, MD   1 drop at 01/30/18 2133  . mirtazapine (REMERON) tablet 15 mg  15 mg Oral QHS Rise Patience, MD   15 mg at 01/30/18 2133  . ondansetron (ZOFRAN) tablet 4 mg  4 mg Oral Q6H PRN Rise Patience, MD       Or  . ondansetron [facility]) injection 4 mg  4 mg Intravenous Q6H PRN Rise Patience, MD      . piperacillin-tazobactam (ZOSYN) IVPB 3.375 g  3.375 g Intravenous Q8H Rolla Flatten, RPH 12.5 mL/hr at 01/31/18 0513 3.375 g at 01/31/18 4098    Results for orders placed or performed during the hospital encounter of 01/26/18 (from the past 48 hour(s))    Glucose, capillary     Status: Abnormal   Collection Time: 01/29/18 11:51 AM  Result Value Ref Range   Glucose-Capillary 144 (H) 70 - 99 mg/dL  Glucose, capillary     Status: None   Collection Time: 01/29/18  4:58 PM  Result Value Ref Range   Glucose-Capillary 98 70 - 99 mg/dL  Glucose, capillary     Status: Abnormal   Collection Time: 01/29/18  8:38 PM  Result Value Ref Range   Glucose-Capillary 119 (H) 70 - 99 mg/dL  Glucose, capillary     Status: Abnormal   Collection Time: 01/30/18 12:41 AM  Result Value Ref Range   Glucose-Capillary 103 (H) 70 - 99 mg/dL  Glucose, capillary     Status: None   Collection Time: 01/30/18  5:07 AM  Result Value Ref Range   Glucose-Capillary 85 70 - 99 mg/dL  APTT     Status: Abnormal   Collection Time: 01/30/18  5:50 AM  Result Value Ref Range   aPTT 64 (H) 24 - 36 seconds    Comment:        IF BASELINE aPTT IS ELEVATED, SUGGEST PATIENT RISK ASSESSMENT BE USED TO DETERMINE APPROPRIATE ANTICOAGULANT THERAPY. Performed at [facility], [place] [address], [place], [place] [address]   Heparin level (unfractionated)     Status: None   Collection Time: 01/30/18  5:50 AM  Result Value Ref Range   Heparin Unfractionated 0.35 0.30 - 0.70 IU/mL    Comment: (NOTE) If heparin results are below expected values, and patient dosage has  been confirmed, suggest follow up testing of antithrombin III levels. Performed at [facility], [place] [address] [place], [place] [address]   TSH     Status: None   Collection Time: 01/30/18  5:50 AM  Result Value Ref Range   TSH 0.597 0.350 - 4.500 uIU/mL    Comment: Performed by a 3rd Generation assay with a functional sensitivity of <=0.01 uIU/mL. Performed at [facility], [place] [address], [place], [place] [address]   T4, free     Status: None   Collection Time: 01/30/18  5:50 AM  Result Value Ref Range   Free T4 1.60 0.82 - 1.77 ng/dL    Comment: (NOTE) Biotin ingestion may interfere with  free T4 tests. If the results are inconsistent with the TSH level, previous test results, or the clinical presentation, then consider biotin interference. If needed, order repeat testing after stopping biotin. Performed at [facility], [place] [address], [place], [place] [address]   CBC with Differential/Platelet     Status: Abnormal   Collection Time: 01/30/18  5:50 AM  Result Value Ref Range   WBC 7.4 4.0 - 10.5 K/uL   RBC 4.58 3.87 - 5.11 MIL/uL   Hemoglobin 13.7 12.0 - 15.0 g/dL   HCT 44.9 36.0 - 46.0 %   MCV 98.0 78.0 - 100.0 fL   MCH 29.9 26.0 - 34.0 pg   MCHC 30.5 30.0 - 36.0 g/dL   RDW 16.4 (H) 11.5 - 15.5 %   Platelets 135 (L) 150 - 400 K/uL   Neutrophils Relative % 66 %   Neutro Abs 4.9 1.7 - 7.7 K/uL   Lymphocytes Relative 24 %   Lymphs Abs 1.8 0.7 - 4.0 K/uL   Monocytes Relative 7 %   Monocytes Absolute 0.5 0.1 - 1.0 K/uL   Eosinophils Relative 2 %   Eosinophils Absolute 0.2 0.0 - 0.7 K/uL   Basophils Relative 0 %   Basophils Absolute 0.0 0.0 - 0.1 K/uL   Immature Granulocytes 1 %   Abs Immature Granulocytes 0.1 0.0 - 0.1 K/uL    Comment: Performed at [facility], [address], [place], [place] [address]  Renal function panel     Status: Abnormal   Collection Time: 01/30/18  5:50 AM  Result Value Ref Range   Sodium 147 (H) 135 - 145 mmol/L   Potassium 3.6 3.5 - 5.1 mmol/L   Chloride 109 98 - 111 mmol/L   CO2 28 22 - 32 mmol/L   Glucose, Bld 79 70 - 99 mg/dL   BUN 29 (H) 8 - 23 mg/dL   Creatinine, Ser 1.68 (H) 0.44 - 1.00 mg/dL   Calcium 8.7 (L) 8.9 - 10.3 mg/dL   Phosphorus 2.6 2.5 - 4.6 mg/dL   Albumin 2.1 (L) 3.5 - 5.0 g/dL   GFR calc non Af Amer 27 (L) >60 mL/min   GFR calc Af Amer 31 (L) >60 mL/min    Comment: (NOTE) The eGFR has been calculated using the CKD EPI equation. This calculation has not been validated in all clinical situations. eGFR's persistently <60 mL/min signify possible Chronic Kidney Disease.    Anion gap 10 5 - 15     Comment: Performed at [facility] [address], [place],  [address]  Glucose, capillary     Status: Abnormal   Collection Time: 01/30/18  7:34 AM  Result Value Ref Range   Glucose-Capillary 68 (L) 70 - 99 mg/dL  Glucose, capillary     Status: Abnormal   Collection Time: 01/30/18  4:16 PM  Result Value Ref Range   Glucose-Capillary 112 (H) 70 - 99 mg/dL  Glucose, capillary     Status: Abnormal   Collection Time: 01/30/18  7:41 PM  Result Value Ref Range   Glucose-Capillary 124 (H) 70 - 99 mg/dL  Glucose, capillary     Status: Abnormal   Collection Time: 01/30/18 11:47 PM  Result Value Ref Range   Glucose-Capillary 121 (H) 70 - 99 mg/dL  Glucose, capillary     Status: None   Collection Time: 01/31/18  4:32 AM  Result Value Ref Range   Glucose-Capillary 94 70 - 99 mg/dL  APTT     Status: Abnormal   Collection Time: 01/31/18  4:50 AM  Result Value Ref Range   aPTT 68 (H) 24 - 36 seconds    Comment:        IF BASELINE aPTT IS ELEVATED, SUGGEST PATIENT RISK ASSESSMENT BE USED TO DETERMINE APPROPRIATE ANTICOAGULANT THERAPY. Performed at [facility], [place],  [place] [address]   CBC     Status: Abnormal   Collection Time: 01/31/18  4:50 AM  Result Value Ref Range   WBC 7.1 4.0 - 10.5 K/uL   RBC 4.69 3.87 - 5.11 MIL/uL   Hemoglobin 13.9 12.0 - 15.0 g/dL   HCT 45.8 36.0 - 46.0 %   MCV 97.7 78.0 - 100.0 fL   MCH 29.6 26.0 - 34.0 pg   MCHC 30.3 30.0 - 36.0 g/dL   RDW 16.4 (H) 11.5 - 15.5 %   Platelets 129 (L) 150 - 400 K/uL    Comment: Performed at [facility], [place] [address] [place], [place] [address]  Heparin level (unfractionated)     Status: None   Collection Time: 01/31/18  4:50 AM  Result Value Ref Range   Heparin Unfractionated 0.33 0.30 - 0.70 IU/mL    Comment: (NOTE) If heparin results are below expected values, and patient dosage has  been confirmed, suggest follow up testing of antithrombin III levels. Performed at  [facility], [place] [address], [place], [place] [address]   Basic metabolic panel     Status: Abnormal   Collection Time: 01/31/18  4:50 AM  Result Value Ref Range   Sodium 144 135 - 145 mmol/L   Potassium 3.9 3.5 - 5.1 mmol/L   Chloride 107 98 - 111 mmol/L   CO2 29 22 - 32 mmol/L   Glucose, Bld 98 70 - 99 mg/dL   BUN 27 (H) 8 - 23 mg/dL   Creatinine, Ser 1.77 (H) 0.44 - 1.00 mg/dL   Calcium 8.7 (L) 8.9 - 10.3 mg/dL   GFR calc non Af Amer 25 (L) >60 mL/min   GFR calc Af Amer 29 (L) >60 mL/min    Comment: (NOTE) The eGFR has been calculated using the CKD EPI equation. This calculation has not been validated in all clinical situations. eGFR's persistently <60 mL/min signify possible Chronic Kidney Disease.    Anion gap 8 5 - 15    Comment: Performed at [facility] [address], [place], [place] [address]  Uric acid     Status: None   Collection Time: 01/31/18  4:50 AM  Result Value Ref Range   Uric Acid, Serum 5.9 2.5 - 7.1 mg/dL    Comment: Performed at [facility] [address] [place], [place] [address]  Glucose, capillary     Status: None   Collection Time: 01/31/18  7:32 AM  Result Value Ref Range   Glucose-Capillary 79 70 - 99 mg/dL  Glucose, capillary     Status: None   Collection Time: 01/31/18 11:14 AM  Result Value Ref Range   Glucose-Capillary 79 70 - 99 mg/dL   Dg Knee Right Port  Result Date: 01/30/2018 CLINICAL DATA:  Pain and swelling.  Total knee arthroplasty. EXAM: PORTABLE RIGHT KNEE - 1-2 VIEW COMPARISON:  Radiographs dated 05/27/2010 FINDINGS: There is no fracture or dislocation. There is a joint effusion. No evidence of loosening of the components of the total knee prosthesis. Osteopenia. IMPRESSION: Joint effusion.  No other acute abnormality. Electronically Signed   By: Lorriane Shire M.D.   On: 01/30/2018 12:05    ROS: ROS Lumbar pain due to previous compression fractures Current right knee pain s/p recent fall  Physical  Exam: Alert and appropriate 84 y/o female in no acute distress Bilateral upper extremities with full rom, no tenderness and no deformity Hips non tender with no pain with  ER/IR of bilateral legs Right knee: no effusion, normal appearance, no abrasion or sign of trauma nv intact distally Quad and patellar tendons intact Pain with quad activation and localizes her pain to anterior patella No joint line tenderness medially or laterally Physical Exam   Assessment/Plan Assessment: right knee contusion s/p fall  Plan: Reviewed x-rays with pt showing previous total knee arthroplasty with no signs of periprosthetic fracture or loosening Recommend supportive treatment with pain medications as needed Activity as tolerated No need for any attempt at aspiration due to no effusion  Follow up in the office for any further complaints May weight bear as tolerated  Merla Riches PA-C, [place] is now [employer] [address], [place], [place] [address] Phone: [phone] [email or web address]  [employer]

## 2018-02-01 LAB — BASIC METABOLIC PANEL
ANION GAP: 9 (ref 5–15)
BUN: 27 mg/dL — AB (ref 8–23)
CALCIUM: 8.4 mg/dL — AB (ref 8.9–10.3)
CO2: 28 mmol/L (ref 22–32)
CREATININE: 1.84 mg/dL — AB (ref 0.44–1.00)
Chloride: 107 mmol/L (ref 98–111)
GFR calc Af Amer: 28 mL/min — ABNORMAL LOW (ref >60)
GFR calc non Af Amer: 24 mL/min — ABNORMAL LOW (ref >60)
GLUCOSE: 89 mg/dL (ref 70–99)
Potassium: 3.7 mmol/L (ref 3.5–5.1)
Sodium: 144 mmol/L (ref 135–145)

## 2018-02-01 LAB — GLUCOSE, CAPILLARY
GLUCOSE-CAPILLARY: 82 mg/dL (ref 70–99)
GLUCOSE-CAPILLARY: 94 mg/dL (ref 70–99)
GLUCOSE-CAPILLARY: 140 mg/dL — AB (ref 70–99)
Glucose-Capillary: 106 mg/dL — ABNORMAL HIGH (ref 70–99)
Glucose-Capillary: 124 mg/dL — ABNORMAL HIGH (ref 70–99)
Glucose-Capillary: 82 mg/dL (ref 70–99)

## 2018-02-01 LAB — CBC
HCT: 42.8 % (ref 36.0–46.0)
Hemoglobin: 13 g/dL (ref 12.0–15.0)
MCH: 29.8 pg (ref 26.0–34.0)
MCHC: 30.4 g/dL (ref 30.0–36.0)
MCV: 98.2 fL (ref 78.0–100.0)
PLATELETS: 136 10*3/uL — AB (ref 150–400)
RBC: 4.36 MIL/uL (ref 3.87–5.11)
RDW: 16.6 % — AB (ref 11.5–15.5)
WBC: 6.8 10*3/uL (ref 4.0–10.5)

## 2018-02-01 LAB — HEPARIN LEVEL (UNFRACTIONATED): HEPARIN UNFRACTIONATED: 0.36 [IU]/mL (ref 0.30–0.70)

## 2018-02-01 NOTE — Progress Notes (Signed)
[facility] PROGRESS NOTE  Heather Benson ZMO:[id number] DOB: 01-Apr-1932 DOA: 01/26/2018  PCP: [name], MD  Brief History/Interval Summary: 30 WF Paroxysmal atrial fibrillation/AVNRT--prior amiodarone-- on Eliquis [off of this since 9/16]--DCCV 07/2014.  So has history of diastolic CHF, chronic kidney disease stage IV, chronic steroid use, recent compression fracture status post kyphoplasty x1 on 8/19 to L2.  Another kyphoplasty was planned for 9/16 to L3 but patient got hospitalized.  She presented with lightheadedness hypotension x-ray findings suggesting pneumonia.  She was hospitalized.  Subsequently noted to have elevated troponin.  Cardiology was consulted.  Consultants: Cardiology.  Interventional radiology.  Orthopedics.  Procedures:   Transthoracic echocardiogram 01/27/18 Study Conclusions  - Left ventricle: The cavity size was normal. Wall thickness was   normal. Systolic function was moderately reduced. The estimated   ejection fraction was in the range of 35% to 40%. Severe   hypokinesis of the mid-apicalanteroseptal, anterior, inferior,   and apical myocardium; consistent with ischemia in the   distribution of the left anterior descending coronary artery; new   since the study of 12/14/2015. Doppler parameters are consistent   with abnormal left ventricular relaxation (grade 1 diastolic   dysfunction). - Aortic valve: Right coronary cusp mobility was moderately   restricted. There was mild stenosis. There was mild regurgitation   directed centrally in the LVOT. Valve area (VTI): 1.55 cm^2.   Valve area (Vmax): 1.38 cm^2. Valve area (Vmean): 1.48 cm^2. - Mitral valve: Moderately calcified annulus. There was mild to   moderate regurgitation directed centrally. - Left atrium: The atrium was moderately to severely dilated. - Right atrium: The atrium was mildly dilated. - Tricuspid valve: There was moderate regurgitation directed   centrally. - Pulmonary  arteries: Systolic pressure was moderately increased.   PA peak pressure: 59 mm Hg (S).  Impressions:  - New extensive anteroapical wall motion abnormality. Consider LAD   ischemia versus stress cardiomyopathy (takotsubo syndrome).  Transthoracic echocardiogram 9/22 Study Conclusions  - Left ventricle: The cavity size was normal. Systolic function was   moderately reduced. The estimated ejection fraction was in the   range of 35% to 40%. Akinesis of the apicalanteroseptal,   anterolateral, and apical myocardium. Doppler parameters are   consistent with abnormal left ventricular relaxation (grade 1   diastolic dysfunction). - Aortic valve: Valve mobility was moderately restricted.   Transvalvular velocity was increased. There was mild stenosis.   There was mild to moderate regurgitation. Valve area (VTI): 0.99   cm^2. Valve area (Vmax): 0.97 cm^2. Valve area (Vmean): 0.94   cm^2. - Mitral valve: There was mild regurgitation. - Left atrium: The atrium was mildly to moderately dilated. - Pulmonary arteries: Systolic pressure was mildly increased. PA   peak pressure: 33 mm Hg (S).   Antibiotics: Patient was on Cefdinir initially.  Currently on Zosyn.  Subjective/Interval History: Patient denies any complaints this morning.  No chest pain or shortness of breath.  Back pain is reasonably well controlled.    ROS: Denies any headaches  Objective:  Vital Signs  Vitals:   01/31/18 1706 01/31/18 2023 02/01/18 0020 02/01/18 0443  BP: 117/69 117/67 109/60 114/61  Pulse: 73 68 60 (!) 58  Resp:  (!) 22 19 19   Temp: 98.6 F (37 C) 98.4 F (36.9 C)  98.3 F (36.8 C)  TempSrc: Oral Oral  Oral  SpO2: 92% 95% 98% 98%  Weight:    76.3 kg  Height:        Intake/Output Summary (  Last 24 hours) at 02/01/2018 0804 Last data filed at 01/31/2018 2202 Gross per 24 hour  Intake 580 ml  Output -  Net 580 ml   Filed Weights   01/30/18 0515 01/31/18 0434 02/01/18 0443  Weight: 73.7 kg  75.8 kg 76.3 kg    General appearance: Awake alert.  In no distress Resp: There to auscultation bilaterally.  No wheezing rales or rhonchi. Cardio: S1-S2 is normal regular.  No S3-S4.  No rubs murmurs or bruit GI: Abdomen remains soft.  Nontender nondistended Extremities: Similar appearance of the right knee.  Mildly swollen. Neurologic: Alert and oriented x3.  No obvious focal neurological deficits.  Lab Results:  Data Reviewed: I have personally reviewed following labs and imaging studies  CBC: Recent Labs  Lab 01/26/18 1016 01/27/18 0708 01/28/18 0605 01/28/18 1034 01/29/18 0458 01/30/18 0550 01/31/18 0450 02/01/18 0436  WBC 11.9* 12.5* 11.4* 11.9* 8.7 7.4 7.1 6.8  NEUTROABS 9.8* 10.1* 9.4* 9.8*  --  4.9  --   --   HGB 14.3 14.7 13.6 14.3 13.7 13.7 13.9 13.0  HCT 48.5* 50.7* 43.7 47.0* 45.2 44.9 45.8 42.8  MCV 99.8 102.6* 96.0 96.1 97.4 98.0 97.7 98.2  PLT 147* 133* 145* 171 153 135* 129* 136*    Basic Metabolic Panel: Recent Labs  Lab 01/27/18 0708 01/28/18 0605 01/29/18 0458 01/30/18 0550 01/31/18 0450 02/01/18 0436  NA 143 143 144 147* 144 144  K 4.5 4.2 3.9 3.6 3.9 3.7  CL 108 108 108 109 107 107  CO2 18* 24 30 28 29 28   GLUCOSE 64* 103* 106* 79 98 89  BUN 23 25* 31* 29* 27* 27*  CREATININE 1.66* 1.52* 1.72* 1.68* 1.77* 1.84*  CALCIUM 9.0 8.5* 8.6* 8.7* 8.7* 8.4*  MG 2.0 2.2  --   --   --   --   PHOS 3.9 2.9  --  2.6  --   --     GFR: Estimated Creatinine Clearance: 22.7 mL/min (A) (by C-G formula based on SCr of 1.84 mg/dL (H)).  Liver Function Tests: Recent Labs  Lab 01/26/18 1303 01/27/18 0708 01/28/18 0605 01/29/18 0458 01/30/18 0550  AST 39  --   --  28  --   ALT 38  --   --  30  --   ALKPHOS 113  --   --  103  --   BILITOT 1.1  --   --  0.8  --   PROT 4.7*  --   --  4.6*  --   ALBUMIN 2.2* 2.2* 2.1* 2.1* 2.1*     Coagulation Profile: Recent Labs  Lab 01/26/18 1016  INR 1.07    Cardiac Enzymes: Recent Labs  Lab  01/26/18 1303 01/26/18 1546 01/27/18 0708  TROPONINI 0.91* 0.88* 0.40*    CBG: Recent Labs  Lab 01/31/18 1619 01/31/18 2021 02/01/18 0019 02/01/18 0441 02/01/18 0724  GLUCAP 120* 135* 124* 82 82     Thyroid Function Tests: Recent Labs    01/30/18 0550  TSH 0.597  FREET4 1.60     Recent Results (from the past 240 hour(s))  Blood Culture (routine x 2)     Status: None   Collection Time: 01/26/18  3:55 AM  Result Value Ref Range Status   Specimen Description BLOOD RIGHT ANTECUBITAL  Final   Special Requests   Final    BOTTLES DRAWN AEROBIC AND ANAEROBIC Blood Culture adequate volume   Culture   Final    NO GROWTH 5 DAYS  Performed at [facility], [place] [address] [place], [place] [address]    Report Status 01/31/2018 FINAL  Final  Blood Culture (routine x 2)     Status: None   Collection Time: 01/26/18  4:03 AM  Result Value Ref Range Status   Specimen Description BLOOD LEFT FOREARM  Final   Special Requests   Final    BOTTLES DRAWN AEROBIC AND ANAEROBIC Blood Culture results may not be optimal due to an excessive volume of blood received in culture bottles   Culture   Final    NO GROWTH 5 DAYS Performed at [facility], [place] [address], [place], [place] [address]    Report Status 01/31/2018 FINAL  Final  MRSA PCR Screening     Status: None   Collection Time: 01/26/18  5:56 PM  Result Value Ref Range Status   MRSA by PCR NEGATIVE NEGATIVE Final    Comment:        The GeneXpert MRSA Assay (FDA approved for NASAL specimens only), is one component of a comprehensive MRSA colonization surveillance program. It is not intended to diagnose MRSA infection nor to guide or monitor treatment for MRSA infections. Performed at [facility], [place] [address] [place], [place] [address]       Radiology Studies: Dg Knee Right Port  Result Date: 01/30/2018 CLINICAL DATA:  Pain and swelling.  Total knee arthroplasty. EXAM: PORTABLE RIGHT KNEE - 1-2  VIEW COMPARISON:  Radiographs dated 05/27/2010 FINDINGS: There is no fracture or dislocation. There is a joint effusion. No evidence of loosening of the components of the total knee prosthesis. Osteopenia. IMPRESSION: Joint effusion.  No other acute abnormality. Electronically Signed   By: Lorriane Shire M.D.   On: 01/30/2018 12:05     Medications:  Scheduled: . amiodarone  200 mg Oral Daily  . aspirin EC  81 mg Oral Daily  . atorvastatin  10 mg Oral Daily  . carvedilol  3.125 mg Oral BID WC  . hydrocortisone sod succinate (SOLU-CORTEF) inj  50 mg Intravenous Q24H  . latanoprost  1 drop Both Eyes QHS  . mirtazapine  15 mg Oral QHS   Continuous: . sodium chloride 10 mL/hr at 01/31/18 1629  . heparin 700 Units/hr (01/31/18 1630)  . piperacillin-tazobactam (ZOSYN)  IV 3.375 g (02/01/18 0524)   XNA:TFTDDUKGURKYH **OR** acetaminophen, ondansetron **OR** ondansetron (ZOFRAN) IV  Assessment/Plan:    Pneumonia involving right lung concerning for aspiration Seen by speech therapy.  On a regular diet.  Patient was initially on oral antibiotics.  Changed over to Zosyn on 9/19.  Respiratory status remained stable.  Continue to wean down oxygen.  Could transition to oral antibiotics tomorrow.  Paroxysmal atrial fibrillation/AVNRT/chronic combined systolic and diastolic CHF Previously on anticoagulation with apixaban.  Was held recently so that she can undergo kyphoplasty.  Echocardiogram showed a EF of 35 to 40% which was low compared to previous.  Cardiology is following.  Patient also noted to have mildly elevated troponin levels.  Echocardiogram did show wall motion abnormalities. Concern was for Takotsubo cardiomyopathy.  She remains on IV heparin.  Echocardiogram was repeated on 9/21 and continues to show low EF.  Patient also noted to be on amiodarone.  Thyroid function tests were normal.  Further management per cardiology.    Vertebral compression fractures Patient apparently being managed  as an outpatient by Dr. Vira Blanco who is a pain management specialist.  MRI lumbar spine was done on 9/10.  She apparently  underwent kyphoplasty to L2 on 12/29/2017.  Plan was to do kyphoplasty of L3 on 9/16 but the patient got hospitalized.  Patient with a significant discomfort to her back.  According to husband and patient she is not able to do anything as a result of this pain.  Interventional radiology was consulted.  Appreciate their assistance.  Tentative plan is for kyphoplasty on 9/23.  Patient was placed on steroids as well.  Acute kidney injury on chronic kidney disease stage 4 Creatinine noted to be slightly higher today but similar to what it was last in the few months.  Her weight is noted to be higher today compared to the last few days but less than what it was last few months.  Does not appear to be overtly volume overloaded.  Urine output not charted accurately.  We will wait and see what cardiology says but she might benefit from some diuretics.  Avoid nephrotoxic agents.  Moderate to severe malnutrition Encourage oral intake.  Mild thrombocytopenia Platelet counts are stable.    Hypoglycemia CBGs noted to be stable.  No further episodes of hypoglycemia.  History of diverticulosis and GI bleed in 2018 Not currently an active issue.  Exophytic kidney lesions Outpatient follow-up.  Mild hypernatremia Encourage oral intake.  Sodium level normal today.  Right knee pain and swelling X-ray did not show any fractures.  It showed joint effusion.  Previous history of arthroplasty in the joint.  Discussed with Dr. Stann Mainland with orthopedics.  Patient was seen by orthopedics.  No intervention is planned.  Conservative management.   DVT Prophylaxis: On IV heparin    Code Status: DNR Family Communication: Discussed with the patient and her husband Disposition Plan: Management as outlined above.  Further input from cardiology is pending.  Kyphoplasty tentatively planned for 9/23.      LOS: 6 days   [facility] Pager [phone] 02/01/2018, 8:04 AM  If 7PM-7AM, please contact night-coverage at [email or web address], password [facility]

## 2018-02-01 NOTE — Progress Notes (Signed)
Progress Note  Patient Name: Heather Benson Date of Encounter: 02/01/2018  Primary Cardiologist: Larae Grooms, MD  Subjective   Breathing is OK   NO CP    Inpatient Medications    Scheduled Meds: . amiodarone  200 mg Oral Daily  . aspirin EC  81 mg Oral Daily  . atorvastatin  10 mg Oral Daily  . carvedilol  3.125 mg Oral BID WC  . hydrocortisone sod succinate (SOLU-CORTEF) inj  50 mg Intravenous Q24H  . latanoprost  1 drop Both Eyes QHS  . mirtazapine  15 mg Oral QHS   Continuous Infusions: . sodium chloride 10 mL/hr at 01/31/18 1629  . heparin 700 Units/hr (01/31/18 1630)  . piperacillin-tazobactam (ZOSYN)  IV 3.375 g (02/01/18 0524)   PRN Meds: acetaminophen **OR** acetaminophen, ondansetron **OR** ondansetron (ZOFRAN) IV   Vital Signs    Vitals:   01/31/18 1706 01/31/18 2023 02/01/18 0020 02/01/18 0443  BP: 117/69 117/67 109/60 114/61  Pulse: 73 68 60 (!) 58  Resp:  (!) 22 19 19   Temp: 98.6 F (37 C) 98.4 F (36.9 C)  98.3 F (36.8 C)  TempSrc: Oral Oral  Oral  SpO2: 92% 95% 98% 98%  Weight:    76.3 kg  Height:        Intake/Output Summary (Last 24 hours) at 02/01/2018 0819 Last data filed at 01/31/2018 2202 Gross per 24 hour  Intake 580 ml  Output -  Net 580 ml   Filed Weights   01/30/18 0515 01/31/18 0434 02/01/18 0443  Weight: 73.7 kg 75.8 kg 76.3 kg    Telemetry    NSR - Personally Reviewed   Physical Exam   GEN: No acute distress.  HEENT: Normocephalic, atraumatic,  Neck: JVP is normal   Cardiac: RRR no murmurs, rubs, or gallops.  Radials/DP/PT 1+ and equal bilaterally.  Respiratory: Clear to auscultation bilaterally. Breathing is unlabored. GI: Soft, nontender, non-distended, BS +x 4. MS: no deformity. Extremities: No clubbing or cyanosis. No edema. Distal pedal pulses are 2+ and equal bilaterally. Diffuse ecchymosis with very thin skin Neuro:  AAOx3. Follows commands. Psych:  Responds to questions appropriately with a  normal affect.  Labs    Chemistry Recent Labs  Lab 01/26/18 1303  01/28/18 0605 01/29/18 0458 01/30/18 0550 01/31/18 0450 02/01/18 0436  NA 139   < > 143 144 147* 144 144  K 4.7   < > 4.2 3.9 3.6 3.9 3.7  CL 105   < > 108 108 109 107 107  CO2 24   < > 24 30 28 29 28   GLUCOSE 108*   < > 103* 106* 79 98 89  BUN 22   < > 25* 31* 29* 27* 27*  CREATININE 1.79*   < > 1.52* 1.72* 1.68* 1.77* 1.84*  CALCIUM 7.9*   < > 8.5* 8.6* 8.7* 8.7* 8.4*  PROT 4.7*  --   --  4.6*  --   --   --   ALBUMIN 2.2*   < > 2.1* 2.1* 2.1*  --   --   AST 39  --   --  28  --   --   --   ALT 38  --   --  30  --   --   --   ALKPHOS 113  --   --  103  --   --   --   BILITOT 1.1  --   --  0.8  --   --   --  GFRNONAA 25*   < > 30* 26* 27* 25* 24*  GFRAA 29*   < > 35* 30* 31* 29* 28*  ANIONGAP 10   < > 11 6 10 8 9    < > = values in this interval not displayed.     Hematology Recent Labs  Lab 01/30/18 0550 01/31/18 0450 02/01/18 0436  WBC 7.4 7.1 6.8  RBC 4.58 4.69 4.36  HGB 13.7 13.9 13.0  HCT 44.9 45.8 42.8  MCV 98.0 97.7 98.2  MCH 29.9 29.6 29.8  MCHC 30.5 30.3 30.4  RDW 16.4* 16.4* 16.6*  PLT 135* 129* 136*    Cardiac Enzymes Recent Labs  Lab 01/26/18 1303 01/26/18 1546 01/27/18 0708  TROPONINI 0.91* 0.88* 0.40*    Recent Labs  Lab 01/26/18 0326  TROPIPOC 0.64*     BNP Recent Labs  Lab 01/26/18 0307  BNP 384.8*     DDimer  Recent Labs  Lab 01/26/18 0307  DDIMER 13.53*     Radiology    Dg Knee Right Port  Result Date: 01/30/2018 CLINICAL DATA:  Pain and swelling.  Total knee arthroplasty. EXAM: PORTABLE RIGHT KNEE - 1-2 VIEW COMPARISON:  Radiographs dated 05/27/2010 FINDINGS: There is no fracture or dislocation. There is a joint effusion. No evidence of loosening of the components of the total knee prosthesis. Osteopenia. IMPRESSION: Joint effusion.  No other acute abnormality. Electronically Signed   By: Lorriane Shire M.D.   On: 01/30/2018 12:05    Cardiac Studies     - Left ventricle: The cavity size was normal. Wall thickness was normal. Systolic function was moderately reduced. The estimated ejection fraction was in the range of 35% to 40%. Severe hypokinesis of the mid-apicalanteroseptal, anterior, inferior, and apical myocardium; consistent with ischemia in the distribution of the left anterior descending coronary artery; new since the study of 12/14/2015. Doppler parameters are consistent with abnormal left ventricular relaxation (grade 1 diastolic dysfunction). - Aortic valve: Right coronary cusp mobility was moderately restricted. There was mild stenosis. There was mild regurgitation directed centrally in the LVOT. Valve area (VTI): 1.55 cm^2. Valve area (Vmax): 1.38 cm^2. Valve area (Vmean): 1.48 cm^2. - Mitral valve: Moderately calcified annulus. There was mild to moderate regurgitation directed centrally. - Left atrium: The atrium was moderately to severely dilated. - Right atrium: The atrium was mildly dilated. - Tricuspid valve: There was moderate regurgitation directed centrally. - Pulmonary arteries: Systolic pressure was moderately increased. PA peak pressure: 59 mm Hg (S).  Impressions:  - New extensive anteroapical wall motion abnormality. Consider LAD ischemia versus stress cardiomyopathy (takotsubo syndrome).  Patient Profile     84 y.o. 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 She was initially felt to have syncope but notes clarified this was  instead leg weakness.  Assessment & Plan    1. LV Systolic dysfunction/NSTEMI:. She does not want to undergo coronary angiography due to the risk ofnephrotoxicity.  Echo yesterday showes LVEF 35 to 40% with akineis of the apical anteroseptal, anterolateral and true apical walls    Consistent with probable  CAD and not stress induced cardiomyopathy   She will remain on IV heparin until kyphoplasty (?9/23)  with plan to resume Eliquis thereafter. [place] treat conservatively  2.Paroxysmal Afib:Maintaining sinus rhythm,onintravenous heparin and amiodarone. Thyroid labs normal.  4.HTN: BP is OK     5.TFT:DDUKGURK lipitor;LDL near goal at 66 with goal <70.  6.CKD IV: she has previously discussed option for hemodialysis with Dr. Lorrene Reid and has decided she will never go on dialysis. Cr is 1.84 today  7. YHC:WCBJSEGBT PAP improved on echo yesterday     For questions or updates, please contact [place] Please consult [email or web address] for contact info under Cardiology/STEMI.  Signed, Dorris Carnes, MD 02/01/2018, 8:19 AM

## 2018-02-01 NOTE — Clinical Social Work Note (Signed)
Clinical Social Work Assessment  Patient Details  Name: Heather Benson MRN: [id number] Date of Birth: [birth year]  Date of referral:  02/01/18               Reason for consult:  Facility Placement                Permission sought to share information with:  Facility Sport and exercise psychologist, Family Supports Permission granted to share information::  Yes, Verbal Permission Granted  Name::     Ashonti Leandro  Agency::  SNF  Relationship::  Spouse  Contact Information:  [phone] mobil  Housing/Transportation Living arrangements for the past 2 months:  Single Family Home Source of Information:  Spouse Patient Interpreter Needed:  None Criminal Activity/Legal Involvement Pertinent to Current Situation/Hospitalization:  No - Comment as needed Significant Relationships:  Adult Children, Spouse Lives with:  Spouse Do you feel safe going back to the place where you live?  Yes Need for family participation in patient care:  Yes (Comment)  Care giving concerns:  CSW received consult regarding discharge planning.  Patient is oriented to self and place.  Patient's spouse provided the information for this assessment.  Patient's spouse stated patient has been living at home during the last 2 months before being admitted to hospital.  Patient had been at [facility] twice before for short- term rehab according to her spouse.  Patient's spouse would like for patience to return to [facility] for rehab and then return to the home.   Social Worker assessment / plan:  CSW spoke with patient's spouse concerning rehab at [facility] before returning home.  Patient's spouse would like for to return home after rehab at [facility].  Employment status:  Retired Forensic scientist:  Medicare PT Recommendations:  [place] / Referral to community resources:     Patient/Family's Response to care:  Patient's spouse states agreement with discharge planning to SNF.  Patient will need  PTAR transportation.  Patient/Family's Understanding of and Emotional Response to Diagnosis, Current Treatment, and Prognosis:  Patient's spouse is realistic regarding patient's medical needs.  Patient's spouse expressed understanding of CSW role in the discharging process.  The patient's spouse had no questions regarding medical treatment or plan.  Emotional Assessment Appearance:  Appears stated age Attitude/Demeanor/Rapport:  Unable to Assess Affect (typically observed):  Unable to Assess Orientation:  Oriented to Self, Oriented to Place Alcohol / Substance use:  Not Applicable Psych involvement (Current and /or in the community):  No (Comment)  Discharge Needs  Concerns to be addressed:  Care Coordination Readmission within the last 30 days:  No Current discharge risk:  Dependent with Mobility Barriers to Discharge:  Continued Medical Work up   [employer], [place] 02/01/2018, 12:35 PM

## 2018-02-01 NOTE — Progress Notes (Signed)
Orthopedics Progress Note  Subjective: Patient still complaining of right knee pain after fall. She reports doing "great" with the knee prior to the fall  Objective:  Vitals:   02/01/18 0820 02/01/18 0857  BP: 124/66   Pulse: 63   Resp:    Temp:  98 F (36.7 C)  SpO2:  96%    General: Awake and alert  Musculoskeletal: Right knee with well healed midline incision and no erythema. No pain with AROM of the knee. There is a 1-2+ effusion on the knee this AM. Compartments are supple and no pain with active ankle pumps Neurovascularly intact  Lab Results  Component Value Date   WBC 6.8 02/01/2018   HGB 13.0 02/01/2018   HCT 42.8 02/01/2018   MCV 98.2 02/01/2018   PLT 136 (L) 02/01/2018       Component Value Date/Time   NA 144 02/01/2018 0436   NA 144 11/20/2017 1211   K 3.7 02/01/2018 0436   CL 107 02/01/2018 0436   CO2 28 02/01/2018 0436   GLUCOSE 89 02/01/2018 0436   BUN 27 (H) 02/01/2018 0436   BUN 31 (H) 11/20/2017 1211   CREATININE 1.84 (H) 02/01/2018 0436   CREATININE 2.03 (H) 02/27/2015 1051   CALCIUM 8.4 (L) 02/01/2018 0436   GFRNONAA 24 (L) 02/01/2018 0436   GFRAA 28 (L) 02/01/2018 0436    Lab Results  Component Value Date   INR 1.07 01/26/2018   INR 1.04 10/21/2017   INR 1.40 10/17/2017    Assessment/Plan: Right knee pain after TKR following fall I have reviewed the patient's XRAYs and agree that there is no peri-prosthetic fracture present. Agree with plan to treat symptomatically as there is no evidence for fracture or joint infection Will make Dr Maureen Ralphs aware tomorrow  Doran Heater. Veverly Fells, MD 02/01/2018 10:50 AM

## 2018-02-01 NOTE — Progress Notes (Signed)
Error

## 2018-02-01 NOTE — Progress Notes (Signed)
[place] for Heparin Indication: Atrial fibrillation  Allergies  Allergen Reactions  . Augmentin [Amoxicillin-Pot Clavulanate] Diarrhea and Nausea Only  . Boniva [Ibandronic Acid] Diarrhea  . Codeine Other (See Comments)    "Spaced out feeling" and Lightheadedness  . Hydromorphone Nausea And Vomiting  . Latex Rash    "Burns me"  . Lisinopril Cough  . Adhesive [Tape] Itching and Rash    Patient Measurements: Height: 5\' 4"  (162.6 cm) Weight: 168 lb 3.4 oz (76.3 kg) IBW/kg (Calculated) : 54.7 Heparin Dosing Weight: 70.5 kg  Vital Signs: Temp: 98 F (36.7 C) (09/22 0857) Temp Source: Oral (09/22 0857) BP: 124/66 (09/22 0820) Pulse Rate: 63 (09/22 0820)  Labs: Recent Labs    01/30/18 0550 01/31/18 0450 02/01/18 0436  HGB 13.7 13.9 13.0  HCT 44.9 45.8 42.8  PLT 135* 129* 136*  APTT 64* 68*  --   HEPARINUNFRC 0.35 0.33 0.36  CREATININE 1.68* 1.77* 1.84*    Estimated Creatinine Clearance: 22.7 mL/min (A) (by C-G formula based on SCr of 1.84 mg/dL (H)).  Assessment: 84 yo female with PAF on apixaban PTA. Apixaban on hold since 9/13 AM for kyphoplasty on 9/23. Pharmacy has been consulted to dose heparin drip for NSTEMI and pending kyphoplasty surgery.  Heparin level remains therapeutic. APTTs have been d/c'd as they are correlating and apixaban no longer affecting heparin levels. CBC stable, no bleeding documented.  Goal of Therapy:  Heparin level 0.3-0.7 units/ml Monitor platelets by anticoagulation protocol: Yes  Plan:  -Continue heparin at 700 units/hr -Monitor daily heparin level and CBC, s/sx bleeding -F/u Cardiology w/u, plans to resume apixaban as appropriate post-op kyphoplasty (scheduled for 9/23)  Elicia Lamp, PharmD, BCPS Clinical Pharmacist Clinical phone [phone] Please check AMION for all [place] contact numbers 02/01/2018 10:45 AM

## 2018-02-01 NOTE — NC FL2 (Signed)
[place] LEVEL OF CARE SCREENING TOOL     IDENTIFICATION  Patient Name: Heather Benson Birthdate: 10/19/1931 Sex: female Admission Date (Current Location): 01/26/2018  [facility] and [place] Number:  Herbalist and Address:  [employer], [place] [address], [place], [place] [address]      Provider Number: [id number]  Attending Physician Name and Address:  Bonnielee Haff, MD  Relative Name and Phone Number:  Kina Shiffman, spouse, [phone]    Current Level of Care: Hospital Recommended Level of Care: [place] Prior Approval Number:    Date Approved/Denied:   PASRR Number: [id number]  Discharge Plan: SNF    Current Diagnoses: Patient Active Problem List   Diagnosis Date Noted  . Non-ST elevation (NSTEMI) myocardial infarction ([place])   . Sepsis ([place]) 01/26/2018  . Closed compression fracture of body of L1 vertebra ([place]) 10/22/2017  . History of herpes zoster 10/15/2017  . Kidney disease 09/05/2017  . Diverticulitis of colon with hemorrhage 12/25/2016  . Acute lower UTI 12/25/2016  . Aortic atherosclerosis ([place]) 12/25/2016  . Acute diastolic CHF (congestive heart failure) ([place])   . CAP (community acquired pneumonia) 12/13/2015  . Elevated troponin 12/13/2015  . Anemia in chronic renal disease 12/13/2015  . Hyponatremia 12/13/2015  . DOE (dyspnea on exertion) 03/06/2015  . Bradycardia 08/23/2014  . Essential hypertension 08/23/2014  . Lightheadedness 08/23/2014  . Fatigue 06/22/2014  . PAF (paroxysmal atrial fibrillation) ([place]) 01/27/2014  . CKD (chronic kidney disease), stage IV ([place]) 01/27/2014  . Swelling of limb 04/01/2012  . Edema leg 04/01/2012    Orientation RESPIRATION BLADDER Height & Weight     Self, Place  O2(nasal cannula 1L) Incontinent Weight: 168 lb 3.4 oz (76.3 kg) Height:  5\' 4"  (162.6 cm)  BEHAVIORAL SYMPTOMS/MOOD NEUROLOGICAL BOWEL NUTRITION STATUS      Incontinent Diet(please  see DC summary)  AMBULATORY STATUS COMMUNICATION OF NEEDS Skin   Extensive Assist Verbally Other (Comment)(open wound on wrist and L arm)                       Personal Care Assistance Level of Assistance  Bathing, Feeding, Dressing Bathing Assistance: Maximum assistance Feeding assistance: Limited assistance Dressing Assistance: Maximum assistance     Functional Limitations Info  Sight, Hearing, Speech          SPECIAL CARE FACTORS FREQUENCY  PT (By licensed PT), OT (By licensed OT)     PT Frequency: 5x/week OT Frequency: 5x/week            Contractures Contractures Info: Not present    Additional Factors Info  Code Status, Allergies, Psychotropic Code Status Info: DNR Allergies Info: Augmentin Amoxicillin-pot Clavulanate, Boniva Ibandronic Acid, Codeine, Hydromorphone, Latex, Lisinopril, Adhesive Tape Psychotropic Info: remeron         Current Medications (02/01/2018):  This is the current hospital active medication list Current Facility-Administered Medications  Medication Dose Route Frequency Provider Last Rate Last Dose  . 0.9 %  sodium chloride infusion   Intravenous Continuous Nita Sells, MD 10 mL/hr at 01/31/18 1629    . acetaminophen (TYLENOL) tablet 650 mg  650 mg Oral Q6H PRN Rise Patience, MD       Or  . acetaminophen (TYLENOL) suppository 650 mg  650 mg Rectal Q6H PRN Rise Patience, MD      . amiodarone (PACERONE) tablet 200 mg  200 mg Oral Daily Rise Patience, MD   200  mg at 02/01/18 0820  . aspirin EC tablet 81 mg  81 mg Oral Daily Dunn, Dayna N, PA-C   81 mg at 02/01/18 0820  . atorvastatin (LIPITOR) tablet 10 mg  10 mg Oral Daily Rise Patience, MD   10 mg at 02/01/18 0947  . carvedilol (COREG) tablet 3.125 mg  3.125 mg Oral BID WC Croitoru, Mihai, MD   3.125 mg at 02/01/18 0820  . heparin ADULT infusion 100 units/mL (25000 units/250mL sodium chloride 0.45%)  700 Units/hr Intravenous Continuous Johnnette Gourd D,  RPH 7 mL/hr at 01/31/18 1630 700 Units/hr at 01/31/18 1630  . hydrocortisone sodium succinate (SOLU-CORTEF) 100 MG injection 50 mg  50 mg Intravenous Q24H Nita Sells, MD   50 mg at 01/31/18 1345  . latanoprost (XALATAN) 0.005 % ophthalmic solution 1 drop  1 drop Both Eyes QHS Rise Patience, MD   1 drop at 01/31/18 2206  . mirtazapine (REMERON) tablet 15 mg  15 mg Oral QHS Rise Patience, MD   15 mg at 01/31/18 2205  . ondansetron (ZOFRAN) tablet 4 mg  4 mg Oral Q6H PRN Rise Patience, MD       Or  . ondansetron [facility]) injection 4 mg  4 mg Intravenous Q6H PRN Rise Patience, MD      . piperacillin-tazobactam (ZOSYN) IVPB 3.375 g  3.375 g Intravenous Q8H Rolla Flatten, RPH 12.5 mL/hr at 02/01/18 0524 3.375 g at 02/01/18 0962     Discharge Medications: Please see discharge summary for a list of discharge medications.  Relevant Imaging Results:  Relevant Lab Results:   Additional Information SSN: [id number]  Estanislado Emms, LCSW

## 2018-02-02 ENCOUNTER — Inpatient Hospital Stay (HOSPITAL_COMMUNITY): Payer: Medicare Other

## 2018-02-02 HISTORY — PX: IR VERTEBROPLASTY LUMBAR BX INC UNI/BIL INC/INJECT/IMAGING: IMG5516

## 2018-02-02 LAB — CBC
HCT: 43.1 % (ref 36.0–46.0)
HEMOGLOBIN: 13.1 g/dL (ref 12.0–15.0)
MCH: 29.5 pg (ref 26.0–34.0)
MCHC: 30.4 g/dL (ref 30.0–36.0)
MCV: 97.1 fL (ref 78.0–100.0)
PLATELETS: 138 10*3/uL — AB (ref 150–400)
RBC: 4.44 MIL/uL (ref 3.87–5.11)
RDW: 16.5 % — ABNORMAL HIGH (ref 11.5–15.5)
WBC: 7.3 10*3/uL (ref 4.0–10.5)

## 2018-02-02 LAB — BASIC METABOLIC PANEL
ANION GAP: 9 (ref 5–15)
BUN: 22 mg/dL (ref 8–23)
CALCIUM: 8.7 mg/dL — AB (ref 8.9–10.3)
CO2: 28 mmol/L (ref 22–32)
Chloride: 108 mmol/L (ref 98–111)
Creatinine, Ser: 1.68 mg/dL — ABNORMAL HIGH (ref 0.44–1.00)
GFR, EST AFRICAN AMERICAN: 31 mL/min — AB (ref >60)
GFR, EST NON AFRICAN AMERICAN: 27 mL/min — AB (ref >60)
Glucose, Bld: 84 mg/dL (ref 70–99)
POTASSIUM: 3.8 mmol/L (ref 3.5–5.1)
SODIUM: 145 mmol/L (ref 135–145)

## 2018-02-02 LAB — SURGICAL PCR SCREEN
MRSA, PCR: NEGATIVE
STAPHYLOCOCCUS AUREUS: NEGATIVE

## 2018-02-02 LAB — GLUCOSE, CAPILLARY
GLUCOSE-CAPILLARY: 74 mg/dL (ref 70–99)
GLUCOSE-CAPILLARY: 146 mg/dL — AB (ref 70–99)

## 2018-02-02 LAB — HEPARIN LEVEL (UNFRACTIONATED): Heparin Unfractionated: 0.39 IU/mL (ref 0.30–0.70)

## 2018-02-02 MED ORDER — FENTANYL CITRATE (PF) 100 MCG/2ML IJ SOLN
INTRAMUSCULAR | Status: AC | PRN
  Administered 2018-02-02: 25 ug via INTRAVENOUS

## 2018-02-02 MED ORDER — BUPIVACAINE HCL (PF) 0.5 % IJ SOLN
INTRAMUSCULAR | Status: AC
  Filled 2018-02-02: qty 30

## 2018-02-02 MED ORDER — MIDAZOLAM HCL 2 MG/2ML IJ SOLN
INTRAMUSCULAR | Status: AC | PRN
  Administered 2018-02-02: 1 mg via INTRAVENOUS

## 2018-02-02 MED ORDER — MIDAZOLAM HCL 2 MG/2ML IJ SOLN
INTRAMUSCULAR | Status: AC
  Filled 2018-02-02: qty 2

## 2018-02-02 MED ORDER — SODIUM CHLORIDE 0.9 % IV SOLN: INTRAVENOUS | Status: AC

## 2018-02-02 MED ORDER — IOPAMIDOL (ISOVUE-300) INJECTION 61%
INTRAVENOUS | Status: AC
  Administered 2018-02-02: 2 mL
  Filled 2018-02-02: qty 50

## 2018-02-02 MED ORDER — TOBRAMYCIN SULFATE 1.2 G IJ SOLR
INTRAMUSCULAR | Status: AC
  Administered 2018-02-02: 15:00:00
  Filled 2018-02-02: qty 1.2

## 2018-02-02 MED ORDER — HYDROCORTISONE NA SUCCINATE PF 100 MG IJ SOLR
50.0000 mg | INTRAMUSCULAR | Status: AC
  Administered 2018-02-02: 50 mg via INTRAVENOUS
  Filled 2018-02-02: qty 2

## 2018-02-02 MED ORDER — MUPIROCIN 2 % EX OINT: 1.0000 "application " | TOPICAL_OINTMENT | Freq: Two times a day (BID) | CUTANEOUS | Status: DC

## 2018-02-02 MED ORDER — BUPIVACAINE HCL (PF) 0.5 % IJ SOLN
INTRAMUSCULAR | Status: AC | PRN
  Administered 2018-02-02: 15 mL

## 2018-02-02 MED ORDER — GELATIN ABSORBABLE 12-7 MM EX MISC
CUTANEOUS | Status: AC
  Filled 2018-02-02: qty 1

## 2018-02-02 MED ORDER — FENTANYL CITRATE (PF) 100 MCG/2ML IJ SOLN
INTRAMUSCULAR | Status: AC
  Filled 2018-02-02: qty 2

## 2018-02-02 NOTE — Progress Notes (Signed)
[facility] PROGRESS NOTE  Heather Benson VFI:[id number] DOB: August 28, 1931 DOA: 01/26/2018  PCP: [name], MD  Brief History/Interval Summary: 84 WF Paroxysmal atrial fibrillation/AVNRT--prior amiodarone-- on Eliquis [off of this since 9/16]--DCCV 07/2014.  So has history of diastolic CHF, chronic kidney disease stage IV, chronic steroid use, recent compression fracture status post kyphoplasty x1 on 8/19 to L2.  Another kyphoplasty was planned for 9/16 to L3 but patient got hospitalized.  She presented with lightheadedness hypotension x-ray findings suggesting pneumonia.  She was hospitalized.  Subsequently noted to have elevated troponin.  Cardiology was consulted.  Consultants: Cardiology.  Interventional radiology.  Orthopedics.  Procedures:   Transthoracic echocardiogram 01/27/18 Study Conclusions  - Left ventricle: The cavity size was normal. Wall thickness was   normal. Systolic function was moderately reduced. The estimated   ejection fraction was in the range of 35% to 40%. Severe   hypokinesis of the mid-apicalanteroseptal, anterior, inferior,   and apical myocardium; consistent with ischemia in the   distribution of the left anterior descending coronary artery; new   since the study of 12/14/2015. Doppler parameters are consistent   with abnormal left ventricular relaxation (grade 1 diastolic   dysfunction). - Aortic valve: Right coronary cusp mobility was moderately   restricted. There was mild stenosis. There was mild regurgitation   directed centrally in the LVOT. Valve area (VTI): 1.55 cm^2.   Valve area (Vmax): 1.38 cm^2. Valve area (Vmean): 1.48 cm^2. - Mitral valve: Moderately calcified annulus. There was mild to   moderate regurgitation directed centrally. - Left atrium: The atrium was moderately to severely dilated. - Right atrium: The atrium was mildly dilated. - Tricuspid valve: There was moderate regurgitation directed   centrally. - Pulmonary  arteries: Systolic pressure was moderately increased.   PA peak pressure: 59 mm Hg (S).  Impressions:  - New extensive anteroapical wall motion abnormality. Consider LAD   ischemia versus stress cardiomyopathy (takotsubo syndrome).  Transthoracic echocardiogram 9/22 Study Conclusions  - Left ventricle: The cavity size was normal. Systolic function was   moderately reduced. The estimated ejection fraction was in the   range of 35% to 40%. Akinesis of the apicalanteroseptal,   anterolateral, and apical myocardium. Doppler parameters are   consistent with abnormal left ventricular relaxation (grade 1   diastolic dysfunction). - Aortic valve: Valve mobility was moderately restricted.   Transvalvular velocity was increased. There was mild stenosis.   There was mild to moderate regurgitation. Valve area (VTI): 0.99   cm^2. Valve area (Vmax): 0.97 cm^2. Valve area (Vmean): 0.94   cm^2. - Mitral valve: There was mild regurgitation. - Left atrium: The atrium was mildly to moderately dilated. - Pulmonary arteries: Systolic pressure was mildly increased. PA   peak pressure: 33 mm Hg (S).   Antibiotics: Patient was on Cefdinir initially.  Currently on Zosyn.    Subjective/Interval History: Patient denies any complaints this morning.  No overnight symptoms.  Husband is at the bedside.     ROS: Denies any chest pain shortness of breath or headaches.  Objective:  Vital Signs  Vitals:   02/01/18 2351 02/02/18 0455 02/02/18 0752 02/02/18 0941  BP: 125/60 120/60 118/62   Pulse: 63 (!) 58 61   Resp: 18 18 19    Temp: 98.2 F (36.8 C) 97.8 F (36.6 C) 98 F (36.7 C)   TempSrc:  Oral Oral   SpO2: 96% 99% 99% 98%  Weight:  75.8 kg    Height:  Intake/Output Summary (Last 24 hours) at 02/02/2018 1036 Last data filed at 02/02/2018 0900 Gross per 24 hour  Intake 166.92 ml  Output -  Net 166.92 ml   Filed Weights   01/31/18 0434 02/01/18 0443 02/02/18 0455  Weight: 75.8 kg  76.3 kg 75.8 kg    General appearance: Awake alert.  In no distress. Resp: Clear to auscultation bilaterally.  Somewhat diminished air entry at the bases.  No wheezing or rhonchi. Cardio: S1-S2 is normal regular.  No S3-S4.  No rubs murmurs or bruit GI: Abdomen remains soft.  Nontender nondistended Extremities: Right knee is stable. Neurologic: Alert and oriented x3.  No focal neurological deficits.  Lab Results:  Data Reviewed: I have personally reviewed following labs and imaging studies  CBC: Recent Labs  Lab 01/27/18 0708 01/28/18 0605 01/28/18 1034 01/29/18 0458 01/30/18 0550 01/31/18 0450 02/01/18 0436 02/02/18 0618  WBC 12.5* 11.4* 11.9* 8.7 7.4 7.1 6.8 7.3  NEUTROABS 10.1* 9.4* 9.8*  --  4.9  --   --   --   HGB 14.7 13.6 14.3 13.7 13.7 13.9 13.0 13.1  HCT 50.7* 43.7 47.0* 45.2 44.9 45.8 42.8 43.1  MCV 102.6* 96.0 96.1 97.4 98.0 97.7 98.2 97.1  PLT 133* 145* 171 153 135* 129* 136* 138*    Basic Metabolic Panel: Recent Labs  Lab 01/27/18 0708 01/28/18 0605 01/29/18 0458 01/30/18 0550 01/31/18 0450 02/01/18 0436 02/02/18 0618  NA 143 143 144 147* 144 144 145  K 4.5 4.2 3.9 3.6 3.9 3.7 3.8  CL 108 108 108 109 107 107 108  CO2 18* 24 30 28 29 28 28   GLUCOSE 64* 103* 106* 79 98 89 84  BUN 23 25* 31* 29* 27* 27* 22  CREATININE 1.66* 1.52* 1.72* 1.68* 1.77* 1.84* 1.68*  CALCIUM 9.0 8.5* 8.6* 8.7* 8.7* 8.4* 8.7*  MG 2.0 2.2  --   --   --   --   --   PHOS 3.9 2.9  --  2.6  --   --   --     GFR: Estimated Creatinine Clearance: 24.8 mL/min (A) (by C-G formula based on SCr of 1.68 mg/dL (H)).  Liver Function Tests: Recent Labs  Lab 01/26/18 1303 01/27/18 0708 01/28/18 0605 01/29/18 0458 01/30/18 0550  AST 39  --   --  28  --   ALT 38  --   --  30  --   ALKPHOS 113  --   --  103  --   BILITOT 1.1  --   --  0.8  --   PROT 4.7*  --   --  4.6*  --   ALBUMIN 2.2* 2.2* 2.1* 2.1* 2.1*     Cardiac Enzymes: Recent Labs  Lab 01/26/18 1303 01/26/18 1546  01/27/18 0708  TROPONINI 0.91* 0.88* 0.40*    CBG: Recent Labs  Lab 02/01/18 0724 02/01/18 1138 02/01/18 1654 02/01/18 2030 02/02/18 0747  GLUCAP 82 106* 94 140* 74      Recent Results (from the past 240 hour(s))  Blood Culture (routine x 2)     Status: None   Collection Time: 01/26/18  3:55 AM  Result Value Ref Range Status   Specimen Description BLOOD RIGHT ANTECUBITAL  Final   Special Requests   Final    BOTTLES DRAWN AEROBIC AND ANAEROBIC Blood Culture adequate volume   Culture   Final    NO GROWTH 5 DAYS Performed at [facility], [place] [address], [place], [place]  [address]    Report Status 01/31/2018 FINAL  Final  Blood Culture (routine x 2)     Status: None   Collection Time: 01/26/18  4:03 AM  Result Value Ref Range Status   Specimen Description BLOOD LEFT FOREARM  Final   Special Requests   Final    BOTTLES DRAWN AEROBIC AND ANAEROBIC Blood Culture results may not be optimal due to an excessive volume of blood received in culture bottles   Culture   Final    NO GROWTH 5 DAYS Performed at [facility], [place] [address] [place], [place] [address]    Report Status 01/31/2018 FINAL  Final  MRSA PCR Screening     Status: None   Collection Time: 01/26/18  5:56 PM  Result Value Ref Range Status   MRSA by PCR NEGATIVE NEGATIVE Final    Comment:        The GeneXpert MRSA Assay (FDA approved for NASAL specimens only), is one component of a comprehensive MRSA colonization surveillance program. It is not intended to diagnose MRSA infection nor to guide or monitor treatment for MRSA infections. Performed at [facility], [place] [address], [place], [place] [address]       Radiology Studies: No results found.   Medications:  Scheduled: . amiodarone  200 mg Oral Daily  . aspirin EC  81 mg Oral Daily  . atorvastatin  10 mg Oral Daily  . carvedilol  3.125 mg Oral BID WC  . hydrocortisone sod succinate (SOLU-CORTEF) inj  50 mg Intravenous Q24H   . latanoprost  1 drop Both Eyes QHS  . mirtazapine  15 mg Oral QHS   Continuous: . sodium chloride 10 mL/hr at 01/31/18 1629  . heparin Stopped (02/02/18 0926)  . piperacillin-tazobactam (ZOSYN)  IV 3.375 g (02/02/18 0454)   DVV:[id number] **OR** acetaminophen, ondansetron **OR** ondansetron (ZOFRAN) IV  Assessment/Plan:    Pneumonia involving right lung concerning for aspiration Seen by speech therapy.  On a regular diet.  Patient was initially on oral antibiotics, Levaquin followed by [facility].  Changed over to Zosyn on 9/19.  Respiratory status is stable.  No clear acute consolidation on imaging studies.  WBC is normalized.  She is afebrile.  She will have completed 8 days of antibiotics tonight.  We will discontinue Zosyn after today's dose.    Paroxysmal atrial fibrillation/AVNRT/chronic combined systolic and diastolic CHF Previously on anticoagulation with apixaban.  Was held recently so that she can undergo kyphoplasty.  Echocardiogram showed a EF of 35 to 40% which was low compared to previous.  Patient also noted to have mildly elevated troponin levels.  Echocardiogram did show wall motion abnormalities. Concern was for Takotsubo cardiomyopathy.  She remains on IV heparin.  Echocardiogram was repeated on 9/21 and continues to show low EF.  Patient also noted to be on amiodarone.  Thyroid function tests were normal.  Cardiology continues to follow.  No ACE inhibitors due to chronic kidney disease.  Patient is on carvedilol.  Plan is to transition her to oral anticoagulant once kyphoplasty has been performed, possibly from tomorrow.  Vertebral compression fractures Patient apparently being managed as an outpatient by Dr. Vira Blanco who is a pain management specialist.  MRI lumbar spine was done on 9/10.  She apparently underwent kyphoplasty to L2 on 12/29/2017.  Plan was to do kyphoplasty of L3 on 9/16 but the patient got hospitalized.  Patient with a significant discomfort to her back.   According to husband and patient she  is not able to do anything as a result of this pain.  Interventional radiology was consulted.  Appreciate their assistance.  Tentative plan is for kyphoplasty on 9/23.  Patient remains on steroids which can be discontinued after procedure today.  Acute kidney injury on chronic kidney disease stage 4 There was a mild increase in her creatinine but improved today.  She has been passing urine.  Does not appear to be volume overloaded.  Continue to watch for now.  Avoid nephrotoxic agents.    Moderate to severe malnutrition Encourage oral intake.  Mild thrombocytopenia Platelet counts are stable.    Hypoglycemia CBGs noted to be stable.  No further episodes of hypoglycemia.  History of diverticulosis and GI bleed in 2018 Not currently an active issue.  Exophytic kidney lesions Outpatient follow-up.  Mild hypernatremia Appears to have resolved.  Right knee pain and swelling X-ray did not show any fractures.  It showed joint effusion.  Previous history of arthroplasty in the joint. Patient was seen by orthopedics.  No intervention is planned.  Conservative management.  PT and OT.   DVT Prophylaxis: On IV heparin    Code Status: DNR Family Communication: Discussed with the patient and her husband Disposition Plan: Management as outlined above.  Kyphoplasty planned for today.  Will likely need some form of rehab at discharge.   LOS: 7 days   [place] [facility] Pager [phone] 02/02/2018, 10:36 AM  If 7PM-7AM, please contact night-coverage at [email or web address], password [facility]

## 2018-02-02 NOTE — Progress Notes (Addendum)
Progress Note  Patient Name: Heather Benson Date of Encounter: 02/02/2018  Primary Cardiologist: Larae Grooms, MD  Subjective   Pt feeling ok today. Denies chest pain or palpitations. Anticipating orthopedic procedure today.  Inpatient Medications    Scheduled Meds: . amiodarone  200 mg Oral Daily  . aspirin EC  81 mg Oral Daily  . atorvastatin  10 mg Oral Daily  . carvedilol  3.125 mg Oral BID WC  . hydrocortisone sod succinate (SOLU-CORTEF) inj  50 mg Intravenous Q24H  . latanoprost  1 drop Both Eyes QHS  . mirtazapine  15 mg Oral QHS   Continuous Infusions: . sodium chloride 10 mL/hr at 01/31/18 1629  . heparin 700 Units/hr (02/01/18 1911)  . piperacillin-tazobactam (ZOSYN)  IV 3.375 g (02/02/18 0454)   PRN Meds: acetaminophen **OR** acetaminophen, ondansetron **OR** ondansetron (ZOFRAN) IV   Vital Signs    Vitals:   02/01/18 1736 02/01/18 2026 02/01/18 2351 02/02/18 0455  BP:  123/63 125/60 120/60  Pulse: 67 64 63 (!) 58  Resp:  18 18 18   Temp:  98.3 F (36.8 C) 98.2 F (36.8 C) 97.8 F (36.6 C)  TempSrc:  Oral  Oral  SpO2:   96% 99%  Weight:    75.8 kg  Height:        Intake/Output Summary (Last 24 hours) at 02/02/2018 0721 Last data filed at 02/02/2018 0657 Gross per 24 hour  Intake 1631.89 ml  Output -  Net 1631.89 ml   Filed Weights   01/31/18 0434 02/01/18 0443 02/02/18 0455  Weight: 75.8 kg 76.3 kg 75.8 kg   Physical Exam   General: Elderly, NAD Skin: Warm, dry, intact  Head: Normocephalic, atraumatic, clear, moist mucus membranes. Neck: Negative for carotid bruits. No JVD Lungs:Clear to ausculation bilaterally. Mild upper lobe wheezing.  No rales, or rhonchi. Breathing is unlabored. Cardiovascular: RRR with S1 S2. No murmurs, rubs, gallops, or LV heave appreciated. Abdomen: Soft, non-tender, non-distended with normoactive bowel sounds.  No obvious abdominal masses. MSK: Strength and tone appear normal for age. 5/5 in all  extremities Extremities: No edema. No clubbing or cyanosis. DP/PT pulses 2+ bilaterally Neuro: Alert and oriented, flat affect. No focal deficits. No facial asymmetry. MAE spontaneously. Psych: Responds to questions appropriately with normal affect.    Labs    Chemistry Recent Labs  Lab 01/26/18 1303  01/28/18 0605 01/29/18 0458 01/30/18 0550 01/31/18 0450 02/01/18 0436  NA 139   < > 143 144 147* 144 144  K 4.7   < > 4.2 3.9 3.6 3.9 3.7  CL 105   < > 108 108 109 107 107  CO2 24   < > 24 30 28 29 28   GLUCOSE 108*   < > 103* 106* 79 98 89  BUN 22   < > 25* 31* 29* 27* 27*  CREATININE 1.79*   < > 1.52* 1.72* 1.68* 1.77* 1.84*  CALCIUM 7.9*   < > 8.5* 8.6* 8.7* 8.7* 8.4*  PROT 4.7*  --   --  4.6*  --   --   --   ALBUMIN 2.2*   < > 2.1* 2.1* 2.1*  --   --   AST 39  --   --  28  --   --   --   ALT 38  --   --  30  --   --   --   ALKPHOS 113  --   --  103  --   --   --  BILITOT 1.1  --   --  0.8  --   --   --   GFRNONAA 25*   < > 30* 26* 27* 25* 24*  GFRAA 29*   < > 35* 30* 31* 29* 28*  ANIONGAP 10   < > 11 6 10 8 9    < > = values in this interval not displayed.    Hematology Recent Labs  Lab 01/30/18 0550 01/31/18 0450 02/01/18 0436  WBC 7.4 7.1 6.8  RBC 4.58 4.69 4.36  HGB 13.7 13.9 13.0  HCT 44.9 45.8 42.8  MCV 98.0 97.7 98.2  MCH 29.9 29.6 29.8  MCHC 30.5 30.3 30.4  RDW 16.4* 16.4* 16.6*  PLT 135* 129* 136*   Cardiac Enzymes Recent Labs  Lab 01/26/18 1303 01/26/18 1546 01/27/18 0708  TROPONINI 0.91* 0.88* 0.40*   No results for input(s): TROPIPOC in the last 168 hours.   BNPNo results for input(s): BNP, PROBNP in the last 168 hours.   DDimer No results for input(s): DDIMER in the last 168 hours.   Radiology    No results found.  Telemetry    02/02/18 NSR  - Personally Reviewed  ECG    No new tracing as of 02/02/18 - Personally Reviewed  Cardiac Studies   Echocardiogram 01/27/18:  -Left ventricle: The cavity size was normal. Wall thickness  was normal. Systolic function was moderately reduced. The estimated ejection fraction was in the range of 35% to 40%. Severe hypokinesis of the mid-apicalanteroseptal, anterior, inferior, and apical myocardium; consistent with ischemia in the distribution of the left anterior descending coronary artery; new since the study of 12/14/2015. Doppler parameters are consistent with abnormal left ventricular relaxation (grade 1 diastolic dysfunction). - Aortic valve: Right coronary cusp mobility was moderately restricted. There was mild stenosis. There was mild regurgitation directed centrally in the LVOT. Valve area (VTI): 1.55 cm^2. Valve area (Vmax): 1.38 cm^2. Valve area (Vmean): 1.48 cm^2. - Mitral valve: Moderately calcified annulus. There was mild to moderate regurgitation directed centrally. - Left atrium: The atrium was moderately to severely dilated. - Right atrium: The atrium was mildly dilated. - Tricuspid valve: There was moderate regurgitation directed centrally. - Pulmonary arteries: Systolic pressure was moderately increased. PA peak pressure: 59 mm Hg (S).  Impressions:  - New extensive anteroapical wall motion abnormality. Consider LAD ischemia versus stress cardiomyopathy (takotsubo syndrome).  Echocardiogram 01/31/18: Study Conclusions  - Left ventricle: The cavity size was normal. Systolic function was   moderately reduced. The estimated ejection fraction was in the   range of 35% to 40%. Akinesis of the apicalanteroseptal,   anterolateral, and apical myocardium. Doppler parameters are   consistent with abnormal left ventricular relaxation (grade 1   diastolic dysfunction). - Aortic valve: Valve mobility was moderately restricted.   Transvalvular velocity was increased. There was mild stenosis.   There was mild to moderate regurgitation. Valve area (VTI): 0.99   cm^2. Valve area (Vmax): 0.97 cm^2. Valve area (Vmean): 0.94    cm^2. - Mitral valve: There was mild regurgitation. - Left atrium: The atrium was mildly to moderately dilated. - Pulmonary arteries: Systolic pressure was mildly increased. PA   peak pressure: 33 mm Hg (S).  Patient Profile     84 y.o. female withparoxysmal atrial fibrillation and possible AVNRT, previous chronic diastolic heart failure, essential hypertension, aortic atherosclerosis,mild CAD(by cardiac catheterization 2012), CKD stage IV, chronic steroid use for ?cough, sarcoidosis, recent L1 compression fracture admitted withweaknessand sepsis and found to have an elevated troponin  level(peak 0.91),with echo showing an extensive new wall motion abnormality and moderately depressed left ventricular systolic function.  Assessment & Plan    1. NSTEMI with new LV systolic dysfunction: -Does not wish to undergo cardiac catheterization due to the risk of nephrotoxicity and personal hx of CKD stage IV  -Echo with new LV dysfunction with LVEF ofd 35% with akinesis of the apical anteroseptal, anterolateral and true apical walls consistent with CAD, not stress induced cardiomyopathy per MD note -Planned kyophplasty (02/02/18)>>keep Hep gtt until after procedure, then can transition to Eliquis for PAF -Plan for conservative management   -Peak troponin, 0.40 -Continue ASA, BB statin   2. Paroxysmal atrial fibrillation: -Maintaining NSR currently -Anticoagulated with IV hep gtt  -TSH, 0.597 on 01/30/18 -Continue amiodarone 200mg  daily   3. HTN: -Stable, 120/60>125/60>123/63>118/64 -Continue carvedilol 3.125mg   4. HLD: -Stable, last LDL 71 on 11/20/17 -Continue Lipitor   5. CKD Stage IV: -Creatinine, 1.84 today>>>1.72>1.68>1.77>1.84 -Had previous discussion regarding hemodialysis with Dr. Dunham>>>decided against HD -Continue to monitor closely, avoid nephrotoxic medications   6. PAH: -Estimated PAH improved per echocardiogram   Signed, Kathyrn Drown NP-C [facility] Pager:  [phone] 02/02/2018, 7:21 AM     For questions or updates, please contact   Please consult [email or web address] for contact info under Cardiology/STEMI.  Personally seen and examined. Agree with above.  Laying fairly comfortably in bed, no chest pain, no significant shortness of breath.  Awaiting kyphoplasty.  GEN: Well nourished, well developed, in no acute distress, elderly  HEENT: normal  Neck: no JVD, carotid bruits, or masses Cardiac: RRR; no murmurs, rubs, or gallops,no edema  Respiratory: Mild scattered wheezes heard.   GI: soft, nontender, nondistended, + BS MS: no deformity or atrophy  Skin: warm and dry, no rash Neuro:  Alert and Oriented x 3, Strength and sensation are intact Psych: euthymic mood, full affect  Creatinine 1.84  Assessment and plan:  Non-ST elevation myocardial infarction - As above, patient did not wish to proceed with cardiac catheterization.  We will continue with medical management.  On heparin drip.  Of course, this does increase her risk for any surgical procedure but she is willing to take on this risk to help her with her back.  Peak troponin was quite low at 0.4.  Ischemic cardiomyopathy -EF 35%, new finding.  Beta-blocker as tolerated.  Avoiding ACE inhibitor because of chronic kidney disease stage IV.  Essential hypertension -Doing well.  Medications reviewed.  Chronic kidney disease stage IV - Creatinine ranging from 1.6-1.9.  Stable.  Has spoken with nephrology.  Does not wish to proceed with hemodialysis if it does get to that point.  Paroxysmal atrial fibrillation - Transition to Eliquis after kyphoplasty, likely start evening day after procedure unless surgery feels otherwise.  Candee Furbish, MD

## 2018-02-02 NOTE — Sedation Documentation (Signed)
Patient breathing through mouth. Unable to obtain accurate ETCO2 monitoring

## 2018-02-02 NOTE — Progress Notes (Signed)
Patient and spouse's preferred SNF, [facility], has offered a rehab bed. CSW informed patient and spouse. CSW to follow for patient's medical readiness, post kyphoplasty, and support with discharge planning.  Estanislado Emms, [place]

## 2018-02-02 NOTE — Procedures (Signed)
S/P L3 VP  

## 2018-02-02 NOTE — Progress Notes (Signed)
Physical Therapy Treatment Patient Details Name: Heather Benson MRN: [id number] DOB: [birth year] Today's Date: 02/02/2018    History of Present Illness Patient is a 84 y/o female who presents with lightheadedness and weakness after being lowered onto floor from spouse. CXR-showing infiltrates concerning for pneumonia. Also with sepsis. x-ray of Rt knee showed no fracture or dislocation.  There is a joint effusion.  Plan for kyphoplasty prior to admission.  PMH includes HTN, CKD stage III, shingles, paroxysmal atrial fibrillation    PT Comments    Pt admitted with above diagnosis. Pt currently with functional limitations due to the deficits listed below (see PT Problem List).Pt was able to only sit EOB 4 min with cues and max assist due to pain.  Removed O2 but had to replace at 1L as she dropped to 87% on RA once sitting EOB and laid back down.  Will continue to follow pt.   Pt will benefit from skilled PT to increase their independence and safety with mobility to allow discharge to the venue listed below.     Follow Up Recommendations  SNF;Supervision for mobility/OOB     Equipment Recommendations  None recommended by PT    Recommendations for Other Services OT consult     Precautions / Restrictions Precautions Precautions: Fall Restrictions Weight Bearing Restrictions: No    Mobility  Bed Mobility Overal bed mobility: Needs Assistance Bed Mobility: Rolling Rolling: Mod assist Sidelying to sit: Max assist;HOB elevated     Sit to sidelying: Mod assist;HOB elevated General bed mobility comments: attempted to roll, she required max A to roll.  iNcr assist to EOB due to pain.   Transfers                 General transfer comment: unable to attempt due to pain.    Ambulation/Gait                 Stairs             Wheelchair Mobility    Modified Rankin (Stroke Patients Only)       Balance Overall balance assessment: Needs assistance;History of  Falls Sitting-balance support: Feet supported;Bilateral upper extremity supported Sitting balance-Leahy Scale: Poor Sitting balance - Comments: Requires BUE support for sitting EOB, able to get fully upright but was in  pain, sat about 4 min EOB.  Postural control: Left lateral lean                                  Cognition Arousal/Alertness: Awake/alert Behavior During Therapy: Anxious;WFL for tasks assessed/performed Overall Cognitive Status: Impaired/Different from baseline Area of Impairment: Memory                     Memory: Decreased short-term memory                Exercises General Exercises - Lower Extremity Long Arc Quad: AROM;Both;10 reps;Seated Heel Slides: AAROM;Right;Left;10 reps;Supine Straight Leg Raises: Right;Left;10 reps;Supine    General Comments        Pertinent Vitals/Pain Pain Assessment: Faces Faces Pain Scale: Hurts whole lot Pain Location: back and Rt knee with movement  Pain Descriptors / Indicators: Discomfort;Grimacing;Guarding Pain Intervention(s): Limited activity within patient's tolerance;Monitored during session;Premedicated before session;Repositioned    Home Living                      Prior Function  PT Goals (current goals can now be found in the care plan section) Acute Rehab PT Goals Patient Stated Goal: to have less pain  Progress towards PT goals: Progressing toward goals    Frequency    Min 3X/week      PT Plan Current plan remains appropriate    Co-evaluation              AM-PAC PT "6 Clicks" Daily Activity  Outcome Measure  Difficulty turning over in bed (including adjusting bedclothes, sheets and blankets)?: Unable Difficulty moving from lying on back to sitting on the side of the bed? : Unable Difficulty sitting down on and standing up from a chair with arms (e.g., wheelchair, bedside commode, etc,.)?: Unable Help needed moving to and from a bed to chair  (including a wheelchair)?: Total Help needed walking in hospital room?: Total Help needed climbing 3-5 steps with a railing? : Total 6 Click Score: 6    End of Session Equipment Utilized During Treatment: Oxygen;Gait belt Activity Tolerance: Patient limited by pain;Patient limited by fatigue Patient left: in bed;with call bell/phone within reach;with bed alarm set Nurse Communication: Mobility status;Patient requests pain meds PT Visit Diagnosis: Pain;Muscle weakness (generalized) (M62.81);Difficulty in walking, not elsewhere classified (R26.2) Pain - Right/Left: Right Pain - part of body: Leg(back)     Time: 5051-8335 PT Time Calculation (min) (ACUTE ONLY): 12 min  Charges:  $Therapeutic Activity: 8-22 mins                     [place] Pager:  [phone]  Office:  [place] 02/02/2018, 12:37 PM

## 2018-02-03 ENCOUNTER — Telehealth: Payer: Self-pay | Admitting: Physician Assistant

## 2018-02-03 DIAGNOSIS — K5900 Constipation, unspecified: Secondary | ICD-10-CM | POA: Diagnosis not present

## 2018-02-03 DIAGNOSIS — I10 Essential (primary) hypertension: Secondary | ICD-10-CM | POA: Diagnosis not present

## 2018-02-03 DIAGNOSIS — B351 Tinea unguium: Secondary | ICD-10-CM | POA: Diagnosis not present

## 2018-02-03 DIAGNOSIS — R278 Other lack of coordination: Secondary | ICD-10-CM | POA: Diagnosis not present

## 2018-02-03 DIAGNOSIS — I5042 Chronic combined systolic (congestive) and diastolic (congestive) heart failure: Secondary | ICD-10-CM | POA: Diagnosis not present

## 2018-02-03 DIAGNOSIS — M6389 Disorders of muscle in diseases classified elsewhere, multiple sites: Secondary | ICD-10-CM | POA: Diagnosis not present

## 2018-02-03 DIAGNOSIS — M25572 Pain in left ankle and joints of left foot: Secondary | ICD-10-CM | POA: Diagnosis not present

## 2018-02-03 DIAGNOSIS — D696 Thrombocytopenia, unspecified: Secondary | ICD-10-CM | POA: Diagnosis not present

## 2018-02-03 DIAGNOSIS — N184 Chronic kidney disease, stage 4 (severe): Secondary | ICD-10-CM | POA: Diagnosis not present

## 2018-02-03 DIAGNOSIS — I214 Non-ST elevation (NSTEMI) myocardial infarction: Secondary | ICD-10-CM | POA: Diagnosis not present

## 2018-02-03 DIAGNOSIS — I739 Peripheral vascular disease, unspecified: Secondary | ICD-10-CM | POA: Diagnosis not present

## 2018-02-03 DIAGNOSIS — N2889 Other specified disorders of kidney and ureter: Secondary | ICD-10-CM | POA: Diagnosis not present

## 2018-02-03 DIAGNOSIS — I480 Paroxysmal atrial fibrillation: Secondary | ICD-10-CM | POA: Diagnosis not present

## 2018-02-03 DIAGNOSIS — S32020A Wedge compression fracture of second lumbar vertebra, initial encounter for closed fracture: Secondary | ICD-10-CM | POA: Diagnosis not present

## 2018-02-03 DIAGNOSIS — H409 Unspecified glaucoma: Secondary | ICD-10-CM | POA: Diagnosis not present

## 2018-02-03 DIAGNOSIS — J9811 Atelectasis: Secondary | ICD-10-CM | POA: Diagnosis not present

## 2018-02-03 DIAGNOSIS — E785 Hyperlipidemia, unspecified: Secondary | ICD-10-CM | POA: Diagnosis not present

## 2018-02-03 DIAGNOSIS — L603 Nail dystrophy: Secondary | ICD-10-CM | POA: Diagnosis not present

## 2018-02-03 DIAGNOSIS — R2681 Unsteadiness on feet: Secondary | ICD-10-CM | POA: Diagnosis not present

## 2018-02-03 DIAGNOSIS — Z8719 Personal history of other diseases of the digestive system: Secondary | ICD-10-CM | POA: Diagnosis not present

## 2018-02-03 DIAGNOSIS — M545 Low back pain: Secondary | ICD-10-CM | POA: Diagnosis not present

## 2018-02-03 DIAGNOSIS — M6281 Muscle weakness (generalized): Secondary | ICD-10-CM | POA: Diagnosis not present

## 2018-02-03 DIAGNOSIS — Z7401 Bed confinement status: Secondary | ICD-10-CM | POA: Diagnosis not present

## 2018-02-03 DIAGNOSIS — M25561 Pain in right knee: Secondary | ICD-10-CM | POA: Diagnosis not present

## 2018-02-03 DIAGNOSIS — S32010D Wedge compression fracture of first lumbar vertebra, subsequent encounter for fracture with routine healing: Secondary | ICD-10-CM | POA: Diagnosis not present

## 2018-02-03 DIAGNOSIS — J209 Acute bronchitis, unspecified: Secondary | ICD-10-CM | POA: Diagnosis not present

## 2018-02-03 DIAGNOSIS — M2550 Pain in unspecified joint: Secondary | ICD-10-CM | POA: Diagnosis not present

## 2018-02-03 DIAGNOSIS — E440 Moderate protein-calorie malnutrition: Secondary | ICD-10-CM | POA: Diagnosis not present

## 2018-02-03 DIAGNOSIS — Z4789 Encounter for other orthopedic aftercare: Secondary | ICD-10-CM | POA: Diagnosis not present

## 2018-02-03 DIAGNOSIS — R52 Pain, unspecified: Secondary | ICD-10-CM | POA: Diagnosis not present

## 2018-02-03 DIAGNOSIS — I5043 Acute on chronic combined systolic (congestive) and diastolic (congestive) heart failure: Secondary | ICD-10-CM | POA: Diagnosis not present

## 2018-02-03 LAB — BASIC METABOLIC PANEL
Anion gap: 8 (ref 5–15)
BUN: 23 mg/dL (ref 8–23)
CO2: 26 mmol/L (ref 22–32)
CREATININE: 1.63 mg/dL — AB (ref 0.44–1.00)
Calcium: 8.5 mg/dL — ABNORMAL LOW (ref 8.9–10.3)
Chloride: 111 mmol/L (ref 98–111)
GFR calc non Af Amer: 28 mL/min — ABNORMAL LOW (ref >60)
GFR, EST AFRICAN AMERICAN: 32 mL/min — AB (ref >60)
Glucose, Bld: 87 mg/dL (ref 70–99)
Potassium: 3.8 mmol/L (ref 3.5–5.1)
SODIUM: 145 mmol/L (ref 135–145)

## 2018-02-03 LAB — CBC
HCT: 42.8 % (ref 36.0–46.0)
Hemoglobin: 12.8 g/dL (ref 12.0–15.0)
MCH: 29.8 pg (ref 26.0–34.0)
MCHC: 29.9 g/dL — ABNORMAL LOW (ref 30.0–36.0)
MCV: 99.8 fL (ref 78.0–100.0)
PLATELETS: 164 10*3/uL (ref 150–400)
RBC: 4.29 MIL/uL (ref 3.87–5.11)
RDW: 16.5 % — AB (ref 11.5–15.5)
WBC: 7.8 10*3/uL (ref 4.0–10.5)

## 2018-02-03 LAB — GLUCOSE, CAPILLARY
Glucose-Capillary: 68 mg/dL — ABNORMAL LOW (ref 70–99)
Glucose-Capillary: 87 mg/dL (ref 70–99)

## 2018-02-03 LAB — HEPARIN LEVEL (UNFRACTIONATED): Heparin Unfractionated: <0.1 IU/mL — ABNORMAL LOW (ref 0.30–0.70)

## 2018-02-03 MED ORDER — CARVEDILOL 3.125 MG PO TABS: 3.1250 mg | ORAL_TABLET | Freq: Two times a day (BID) | ORAL | Status: DC

## 2018-02-03 MED ORDER — APIXABAN 2.5 MG PO TABS: 2.5000 mg | ORAL_TABLET | Freq: Two times a day (BID) | ORAL | Status: DC

## 2018-02-03 MED ORDER — FUROSEMIDE 20 MG PO TABS: 20.0000 mg | ORAL_TABLET | Freq: Every day | ORAL | Status: DC

## 2018-02-03 MED ORDER — TRAMADOL HCL 50 MG PO TABS: 50.0000 mg | ORAL_TABLET | Freq: Four times a day (QID) | ORAL | 0 refills | Status: DC | PRN

## 2018-02-03 MED ORDER — POLYETHYLENE GLYCOL 3350 17 G PO PACK: 17.0000 g | PACK | Freq: Every day | ORAL | 0 refills | Status: AC | PRN

## 2018-02-03 NOTE — Progress Notes (Addendum)
[place] for apixaban Indication: Atrial fibrillation  Allergies  Allergen Reactions  . Augmentin [Amoxicillin-Pot Clavulanate] Diarrhea and Nausea Only  . Boniva [Ibandronic Acid] Diarrhea  . Codeine Other (See Comments)    "Spaced out feeling" and Lightheadedness  . Hydromorphone Nausea And Vomiting  . Latex Rash    "Burns me"  . Lisinopril Cough  . Adhesive [Tape] Itching and Rash    Patient Measurements: Height: 5\' 4"  (162.6 cm) Weight: 165 lb 12.6 oz (75.2 kg) IBW/kg (Calculated) : 54.7 Heparin Dosing Weight: 70.5 kg  Vital Signs: Temp: 97.4 F (36.3 C) (09/24 0735) Temp Source: Axillary (09/24 0735) BP: 139/71 (09/24 0857) Pulse Rate: 66 (09/24 0857)  Labs: Recent Labs    02/01/18 0436 02/02/18 0618 02/03/18 0543  HGB 13.0 13.1 12.8  HCT 42.8 43.1 42.8  PLT 136* 138* 164  HEPARINUNFRC 0.36 0.39 <0.10*  CREATININE 1.84* 1.68* 1.63*    Estimated Creatinine Clearance: 25.5 mL/min (A) (by C-G formula based on SCr of 1.63 mg/dL (H)).  Assessment: 84 yo female with PAF on apixaban PTA (2.5mg  po bid). Apixaban on hold since 9/13 AM s/p kyphoplasty on 9/23. She was on lovenox then heparin (bruising was noted) but this was discontinued post op. Pharmacy to resume apixaban tonight and per cardiology plans noted to discontinue aspirin.  -SCr= 1.63, wt= 75kg, CBC stable   Goal of Therapy:  Monitor platelets by anticoagulation protocol: Yes  Plan:  -restart apixaban 2.5mg  po bid tongight -Will follow patient progress  Hildred Laser, PharmD Clinical Pharmacist Please check Amion for pharmacy contact number

## 2018-02-03 NOTE — Discharge Summary (Signed)
[facility]  Physician Discharge Summary   Patient ID: JOCILYN TREGO MRN: [id number] DOB/AGE: 01-12-1932 84 y.o.  Admit date: 01/26/2018 Discharge date: 02/03/2018  PCP: [name], MD  DISCHARGE DIAGNOSES:  Acute bronchitis Paroxysmal atrial fibrillation Chronic combined systolic and diastolic CHF Vertebral compression fracture status post kyphoplasty of L3 Chronic kidney disease stage IV Moderate to severe malnutrition    RECOMMENDATIONS FOR OUTPATIENT FOLLOW UP: 1. CBC and basic metabolic panel on a weekly basis starting this Thursday 2. Cardiology to arrange outpatient follow-up.   DISCHARGE CONDITION: fair  Diet recommendation: Heart healthy  Filed Weights   02/01/18 0443 02/02/18 0455 02/03/18 0400  Weight: 76.3 kg 75.8 kg 75.2 kg    INITIAL HISTORY: 84 WF Paroxysmal atrial fibrillation/AVNRT--prior amiodarone-- on Eliquis [off of this since 9/16]--DCCV 07/2014.  So has history of diastolic CHF, chronic kidney disease stage IV, chronic steroid use, recent compression fracture status post kyphoplasty x1 on 8/19 to L2.  Another kyphoplasty was planned for 9/16 to L3 but patient got hospitalized.  She presented with lightheadedness hypotension x-ray findings suggesting pneumonia.  She was hospitalized.  Subsequently noted to have elevated troponin.  Cardiology was consulted.  Consultants: Cardiology.  Interventional radiology.  Orthopedics.  Procedures:   Transthoracic echocardiogram 01/27/18 Study Conclusions  - Left ventricle: The cavity size was normal. Wall thickness was normal. Systolic function was moderately reduced. The estimated ejection fraction was in the range of 35% to 40%. Severe hypokinesis of the mid-apicalanteroseptal, anterior, inferior, and apical myocardium; consistent with ischemia in the distribution of the left anterior descending coronary artery; new since the study of 12/14/2015. Doppler parameters are  consistent with abnormal left ventricular relaxation (grade 1 diastolic dysfunction). - Aortic valve: Right coronary cusp mobility was moderately restricted. There was mild stenosis. There was mild regurgitation directed centrally in the LVOT. Valve area (VTI): 1.55 cm^2. Valve area (Vmax): 1.38 cm^2. Valve area (Vmean): 1.48 cm^2. - Mitral valve: Moderately calcified annulus. There was mild to moderate regurgitation directed centrally. - Left atrium: The atrium was moderately to severely dilated. - Right atrium: The atrium was mildly dilated. - Tricuspid valve: There was moderate regurgitation directed centrally. - Pulmonary arteries: Systolic pressure was moderately increased. PA peak pressure: 59 mm Hg (S).  Impressions:  - New extensive anteroapical wall motion abnormality. Consider LAD ischemia versus stress cardiomyopathy (takotsubo syndrome).  Transthoracic echocardiogram 9/22 Study Conclusions  - Left ventricle: The cavity size was normal. Systolic function was moderately reduced. The estimated ejection fraction was in the range of 35% to 40%. Akinesis of the apicalanteroseptal, anterolateral, and apical myocardium. Doppler parameters are consistent with abnormal left ventricular relaxation (grade 1 diastolic dysfunction). - Aortic valve: Valve mobility was moderately restricted. Transvalvular velocity was increased. There was mild stenosis. There was mild to moderate regurgitation. Valve area (VTI): 0.99 cm^2. Valve area (Vmax): 0.97 cm^2. Valve area (Vmean): 0.94 cm^2. - Mitral valve: There was mild regurgitation. - Left atrium: The atrium was mildly to moderately dilated. - Pulmonary arteries: Systolic pressure was mildly increased. PA peak pressure: 33 mm Hg (S).   Kyphoplasty L3 on 9/23    HOSPITAL COURSE:   Suspected pneumonia but more likely to be acute bronchitis/atelectasis There was some concern for pneumonia  at the time of admission.  However x-ray only showed atelectasis.  She was seen by speech therapy.  Did not have any aspiration.  She was maintained on heart healthy diet.  She was initially given Levaquin followed by [facility] followed by Zosyn.  Respiratory status remained stable.  She does have a cough.  This is most likely secondary to atelectasis as the patient has been laying in the bed for the last so many days.  She is finally able to get up and work with physical therapy now that he is status post kyphoplasty.  Continue with incentive spirometry.  She has completed more than 7 days of antibiotics.  Does not need any further doses.  WBC has been normal.  She has been afebrile.     Paroxysmal atrial fibrillation/AVNRT/chronic combined systolic and diastolic CHF Echocardiogram showed a EF of 35 to 40% which was low compared to previous.  Patient also noted to have mildly elevated troponin levels.  Echocardiogram did show wall motion abnormalities. Concern was for Takotsubo cardiomyopathy. Echocardiogram was repeated on 9/21 and continues to show low EF.  Patient also noted to be on amiodarone.  Thyroid function tests were normal.  No ACE inhibitors due to chronic kidney disease.  Patient is on carvedilol.  Resume apixaban from later today.  Outpatient follow-up to be arranged by cardiology.  Vertebral compression fractures Patient apparently being managed as an outpatient by Dr. Vira Blanco who is a pain management specialist.  MRI lumbar spine was done on 9/10.  She apparently underwent kyphoplasty to L2 on 12/29/2017.  Plan was to do kyphoplasty of L3 on 9/16 but the patient got hospitalized.  Patient with a significant discomfort to her back.  According to husband and patient she is not able to do anything as a result of this pain.  Interventional radiology was consulted.  Patient underwent kyphoplasty of L3 on 9/23 with significant improvement in her symptoms.  She is able to get up and work with physical  therapy today.  She will still benefit from short-term rehab.  Acute kidney injury on chronic kidney disease stage 4 Renal function has been stable for the most part.  Avoid nephrotoxic agents.  Monitor blood work in the skilled nursing facility.  Followed by Dr. Lorrene Reid with [facility].  Moderate to severe malnutrition Encourage oral intake.  Mild thrombocytopenia Platelet counts are stable.    Hypoglycemia Likely due to poor oral intake.  Appears to have resolved.  History of diverticulosis and GI bleed in 2018 Not currently an active issue.  Exophytic kidney lesions Outpatient follow-up.  Mild hypernatremia Appears to have resolved.  Right knee pain and swelling X-ray did not show any fractures.  It showed joint effusion.  Previous history of arthroplasty in the joint. Patient was seen by orthopedics.  No intervention is planned.  Conservative management.  PT and OT.  Overall stable.  Patient feels much better this morning.  Looking forward to going to skilled nursing facility for rehab.  Discussed with her and her husband in detail.  Okay for discharge to SNF today.    PERTINENT LABS:  The results of significant diagnostics from this hospitalization (including imaging, microbiology, ancillary and laboratory) are listed below for reference.    Microbiology: Recent Results (from the past 240 hour(s))  Blood Culture (routine x 2)     Status: None   Collection Time: 01/26/18  3:55 AM  Result Value Ref Range Status   Specimen Description BLOOD RIGHT ANTECUBITAL  Final   Special Requests   Final    BOTTLES DRAWN AEROBIC AND ANAEROBIC Blood Culture adequate volume   Culture   Final    NO GROWTH 5 DAYS Performed at [facility], [address] [place], [place] [address]  Report Status 01/31/2018 FINAL  Final  Blood Culture (routine x 2)     Status: None   Collection Time: 01/26/18  4:03 AM  Result Value Ref Range Status   Specimen  Description BLOOD LEFT FOREARM  Final   Special Requests   Final    BOTTLES DRAWN AEROBIC AND ANAEROBIC Blood Culture results may not be optimal due to an excessive volume of blood received in culture bottles   Culture   Final    NO GROWTH 5 DAYS Performed at [facility], [place] [address] [place], [place] [address]    Report Status 01/31/2018 FINAL  Final  MRSA PCR Screening     Status: None   Collection Time: 01/26/18  5:56 PM  Result Value Ref Range Status   MRSA by PCR NEGATIVE NEGATIVE Final    Comment:        The GeneXpert MRSA Assay (FDA approved for NASAL specimens only), is one component of a comprehensive MRSA colonization surveillance program. It is not intended to diagnose MRSA infection nor to guide or monitor treatment for MRSA infections. Performed at [facility], [place] [address] [place], [place] [address]   Surgical PCR screen     Status: None   Collection Time: 02/02/18 11:19 AM  Result Value Ref Range Status   MRSA, PCR NEGATIVE NEGATIVE Final   Staphylococcus aureus NEGATIVE NEGATIVE Final    Comment: (NOTE) The Xpert SA Assay (FDA approved for NASAL specimens in patients 42 years of age and older), is one component of a comprehensive surveillance program. It is not intended to diagnose infection nor to guide or monitor treatment. Performed at [facility], [place] [address] [place], [place] [address]      Labs: Basic Metabolic Panel: Recent Labs  Lab 01/28/18 [phone]  01/30/18 0550 01/31/18 0450 02/01/18 0436 02/02/18 0618 02/03/18 0543  NA 143   < > 147* 144 144 145 145  K 4.2   < > 3.6 3.9 3.7 3.8 3.8  CL 108   < > 109 107 107 108 111  CO2 24   < > 28 29 28 28 26   GLUCOSE 103*   < > 79 98 89 84 87  BUN 25*   < > 29* 27* 27* 22 23  CREATININE 1.52*   < > 1.68* 1.77* 1.84* 1.68* 1.63*  CALCIUM 8.5*   < > 8.7* 8.7* 8.4* 8.7* 8.5*  MG 2.2  --   --   --   --   --   --   PHOS 2.9  --  2.6  --   --   --   --    < > = values in  this interval not displayed.   Liver Function Tests: Recent Labs  Lab 01/28/18 0605 01/29/18 0458 01/30/18 0550  AST  --  28  --   ALT  --  30  --   ALKPHOS  --  103  --   BILITOT  --  0.8  --   PROT  --  4.6*  --   ALBUMIN 2.1* 2.1* 2.1*   CBC: Recent Labs  Lab 01/28/18 0605 01/28/18 1034  01/30/18 0550 01/31/18 0450 02/01/18 0436 02/02/18 0618 02/03/18 0543  WBC 11.4* 11.9*   < > 7.4 7.1 6.8 7.3 7.8  NEUTROABS 9.4* 9.8*  --  4.9  --   --   --   --   HGB 13.6 14.3   < > 13.7  13.9 13.0 13.1 12.8  HCT 43.7 47.0*   < > 44.9 45.8 42.8 43.1 42.8  MCV 96.0 96.1   < > 98.0 97.7 98.2 97.1 99.8  PLT 145* 171   < > 135* 129* 136* 138* 164   < > = values in this interval not displayed.   BNP: BNP (last 3 results) Recent Labs    10/15/17 1055 01/26/18 0307  BNP 141.8* 384.8*    CBG: Recent Labs  Lab 02/01/18 1654 02/01/18 2030 02/02/18 0747 02/02/18 2032 02/03/18 0729  GLUCAP 94 140* 74 146* 68*     IMAGING STUDIES Ct Head Wo Contrast  Result Date: 01/26/2018 CLINICAL DATA:  Dizziness with standing. LEFT-sided weakness. History of atrial fibrillation, hypercholesterolemia, sarcoidosis. EXAM: CT HEAD WITHOUT CONTRAST TECHNIQUE: Contiguous axial images were obtained from the base of the skull through the vertex without intravenous contrast. COMPARISON:  MRI head November 23, 2015 FINDINGS: BRAIN: No intraparenchymal hemorrhage, mass effect nor midline shift. Moderate to severe parenchymal brain volume loss. No hydrocephalus. Old small LEFT cerebellar infarct. Patchy supratentorial white matter. No acute large vascular territory infarcts. No abnormal extra-axial fluid collections. Basal cisterns are patent. 6 mm pineal cyst. VASCULAR: Moderate calcific atherosclerosis of the carotid siphons. SKULL: No skull fracture. Moderate RIGHT temporomandibular osteoarthrosis. Osteopenia. No significant scalp soft tissue swelling. SINUSES/ORBITS: Mild paranasal sinus mucosal thickening.  RIGHT frontal sinus osteoma. Mastoid air cells are well aerated.The included ocular globes and orbital contents are non-suspicious. Status post bilateral ocular lens implants. OTHER: None. IMPRESSION: 1. No acute intracranial process. 2. Stable moderate to severe parenchymal brain volume loss. 3. Progressed moderate chronic small vessel ischemic changes. Electronically Signed   By: Elon Alas M.D.   On: 01/26/2018 03:40   Ct Chest Wo Contrast  Result Date: 01/26/2018 CLINICAL DATA:  Fever and hypoxia. Pneumonia, unresolved for complicated EXAM: CT CHEST WITHOUT CONTRAST TECHNIQUE: Multidetector CT imaging of the chest was performed following the standard protocol without IV contrast. COMPARISON:  12/03/2013 FINDINGS: Cardiovascular: No cardiomegaly or pericardial effusion. There is mitral and aortic annular calcification. Degree of aortic valve calcification. Atherosclerosis of the aorta and coronaries. Mediastinum/Nodes: Negative for adenopathy or mass. Lungs/Pleura: Streaky opacity with volume loss in the right perihilar region and right lower lobe, consistent with scarring. This was also seen previously, with accentuation likely from lower lung volumes today. There is a degree of subpleural reticulation also on the left, nonspecific. No honeycombing. No air bronchogram, edema, effusion, or masslike finding Upper Abdomen: No acute finding Musculoskeletal: No acute or aggressive finding. IMPRESSION: 1. Right perihilar atelectasis/scarring. No consolidating pneumonia. 2. Low lung volumes with nonspecific subpleural reticulation. Electronically Signed   By: Monte Fantasia M.D.   On: 01/26/2018 11:30   Mr Lumbar Spine Wo Contrast  Result Date: 01/21/2018 CLINICAL DATA:  84 y/o F; 6 months of lower back pain. Weakness in the legs. EXAM: MRI LUMBAR SPINE WITHOUT CONTRAST TECHNIQUE: Multiplanar, multisequence MR imaging of the lumbar spine was performed. No intravenous contrast was administered.  COMPARISON:  12/12/2017 lumbar spine CT. 10/18/2017 lumbar spine MRI. FINDINGS: Segmentation:  Standard. Alignment:  Physiologic. Vertebrae: Interval L3 anterior superior endplate compression deformity minimal loss of height and edema indicating recent injury. Interval L2 inferior endplate compression deformity with mild central loss of vertebral body height and edema indicating recent injury. Stable chronic severe T12 and L1 compression deformities and mild chronic superior endplate deformity of the L2 vertebral body. Stable chronic mild compression deformity of the L4 vertebral body superior endplate.  Low signal kyphoplasty material within the L1, L2, and L4 vertebral bodies. Conus medullaris and cauda equina: Conus extends to the L2 level. Conus and cauda equina appear normal. Paraspinal and other soft tissues: Left kidney interpolar cyst measuring 21 mm and multiple additional smaller cysts in the kidneys bilaterally. Low signal right kidney lower pole subcentimeter cyst, likely hemorrhagic cyst. Disc levels: T11-12: Mild retropulsion of the L1 superior endplate resulting in ventral thecal sac effacement and mild canal stenosis. No significant foraminal stenosis. T12-L1: Moderate stable bony retropulsion of the L1 vertebral body with ventral thecal sac effacement and minimal contact on the anterior cord. Mild canal stenosis and mild bilateral foraminal stenosis. No cord compression. L1-2: Retropulsion of the L1 vertebral body is eccentric to the right and results in mild right-sided foraminal stenosis. No left foraminal stenosis. At the disc level there is no significant canal stenosis. L2-3: No significant disc displacement, foraminal stenosis, or canal stenosis. L3-4: Mild diffuse disc bulge slight retropulsion of the L4 superior endplate results in mild canal stenosis. Combined with left-greater-than-right facet hypertrophy there is mild left foraminal stenosis. No right foraminal stenosis. L4-5: Mild  bilateral facet hypertrophy and small right foraminal disc protrusion. No significant foraminal or canal stenosis. L5-S1: Mild disc bulge and facet hypertrophy. No significant foraminal or canal stenosis. IMPRESSION: 1. Interval L3 anterior superior endplate compression deformity with minimal loss of height and edema indicating recent injury. 2. Interval L2 inferior endplate compression deformity with central loss of vertebral body height and edema indicating recent injury. 3. Stable chronic severe T12 compression deformity, severe L1 compression deformity, mild L2 superior endplate compression deformity, and L4 mild superior endplate compression deformity. 4. Multilevel degenerative changes of the spine combined with retropulsion of the T12, L1, and L4 vertebral bodies results in multiple levels of mild canal and foraminal stenosis. No high-grade stenosis. Electronically Signed   By: Kristine Garbe M.D.   On: 01/21/2018 01:06   Nm Pulmonary Vent And Perf (v/q Scan)  Result Date: 01/26/2018 CLINICAL DATA:  Shortness of breath. EXAM: NUCLEAR MEDICINE VENTILATION - PERFUSION LUNG SCAN TECHNIQUE: Ventilation images were obtained in multiple projections using inhaled aerosol Tc-63m DTPA. Perfusion images were obtained in multiple projections after intravenous injection of Tc-65m-MAA. RADIOPHARMACEUTICALS:  Thirty-two mCi of Tc-4m DTPA aerosol inhalation and 4.3 mCi Tc78m-MAA IV COMPARISON:  Chest x-rays dated 01/26/2018 and 10/15/2017 FINDINGS: The patient has chronic elevation of the right hemidiaphragm. The patient swallowed moderate amount of the DTPA. Ventilation: No focal ventilation defect. Perfusion: No wedge shaped peripheral perfusion defects to suggest acute pulmonary embolism. IMPRESSION: Normal ventilation perfusion defect. Electronically Signed   By: Lorriane Shire M.D.   On: 01/26/2018 09:15   Dg Chest Port 1 View  Result Date: 01/27/2018 CLINICAL DATA:  84 year old female with pulmonary  edema. History of aortic stenosis and congestive heart failure. EXAM: PORTABLE CHEST 1 VIEW COMPARISON:  Prior chest x-ray and CT scan of the chest 01/26/2018 FINDINGS: Stable cardiac and mediastinal contours. Progressive atelectasis of the right lower lobe now obscuring the right hemidiaphragm. Stable mild atelectasis versus scarring in the left lower lobe. Biapical pleuroparenchymal scarring. Pulmonary vascular congestion without overt edema. Atherosclerotic calcifications again noted in the transverse aorta. Calcification of the mitral valve annulus. No acute osseous abnormality. IMPRESSION: 1. Progressive right lower lobe atelectasis now resulting in obscuration of the right hemidiaphragm. 2. Mild vascular congestion without overt edema. Electronically Signed   By: Jacqulynn Cadet M.D.   On: 01/27/2018 09:20   Dg Chest Portable  1 View  Result Date: 01/26/2018 CLINICAL DATA:  Fever, hypoxia tonight. EXAM: PORTABLE CHEST 1 VIEW COMPARISON:  Chest radiograph October 15, 2017 and December 26, 2015 FINDINGS: Increasing elevated RIGHT hemidiaphragm. RIGHT mid lung zone bandlike density overlapping fissure. Mild bronchitic changes. Patchy airspace opacity silhouetting RIGHT pulmonary hilum. Cardiac silhouette is normal size. Calcified aortic arch. Biapical pleural thickening. No pneumothorax. Osteopenia. IMPRESSION: Bronchitic changes with RIGHT atelectasis. Potential RIGHT perihilar pneumonia. Aortic Atherosclerosis (ICD10-I70.0). Electronically Signed   By: Elon Alas M.D.   On: 01/26/2018 03:48   Dg Knee Right Port  Result Date: 01/30/2018 CLINICAL DATA:  Pain and swelling.  Total knee arthroplasty. EXAM: PORTABLE RIGHT KNEE - 1-2 VIEW COMPARISON:  Radiographs dated 05/27/2010 FINDINGS: There is no fracture or dislocation. There is a joint effusion. No evidence of loosening of the components of the total knee prosthesis. Osteopenia. IMPRESSION: Joint effusion.  No other acute abnormality. Electronically  Signed   By: Lorriane Shire M.D.   On: 01/30/2018 12:05    DISCHARGE EXAMINATION: Vitals:   02/03/18 0000 02/03/18 0400 02/03/18 0735 02/03/18 0857  BP: 102/61 119/66 127/63 139/71  Pulse: (!) 59 61 63 66  Resp: 19 19 (!) 23   Temp: 97.9 F (36.6 C) 98 F (36.7 C) (!) 97.4 F (36.3 C)   TempSrc: Oral Oral Axillary   SpO2: 97% 98% 100%   Weight:  75.2 kg    Height:       General appearance: alert, cooperative, appears stated age and no distress Resp: clear to auscultation bilaterally Cardio: regular rate and rhythm, S1, S2 normal, no murmur, click, rub or gallop GI: soft, non-tender; bowel sounds normal; no masses,  no organomegaly  DISPOSITION: SNF  Discharge Instructions    (HEART FAILURE PATIENTS) Call MD:  Anytime you have any of the following symptoms: 1) 3 pound weight gain in 24 hours or 5 pounds in 1 week 2) shortness of breath, with or without a dry hacking cough 3) swelling in the hands, feet or stomach 4) if you have to sleep on extra pillows at night in order to breathe.   Complete by:  As directed    Call MD for:  difficulty breathing, headache or visual disturbances   Complete by:  As directed    Call MD for:  extreme fatigue   Complete by:  As directed    Call MD for:  persistant dizziness or light-headedness   Complete by:  As directed    Call MD for:  persistant nausea and vomiting   Complete by:  As directed    Call MD for:  temperature >100.4   Complete by:  As directed    Discharge instructions   Complete by:  As directed    Please see instructions on the discharge summary  You were cared for by a hospitalist during your hospital stay. If you have any questions about your discharge medications or the care you received while you were in the hospital after you are discharged, you can call the unit and asked to speak with the hospitalist on call if the hospitalist that took care of you is not available. Once you are discharged, your primary care physician  will handle any further medical issues. Please note that NO REFILLS for any discharge medications will be authorized once you are discharged, as it is imperative that you return to your primary care physician (or establish a relationship with a primary care physician if you do not have one) for your  aftercare needs so that they can reassess your need for medications and monitor your lab values. If you do not have a primary care physician, you can call [phone] for a physician referral.   Increase activity slowly   Complete by:  As directed          Allergies as of 02/03/2018      Reactions   Augmentin [amoxicillin-pot Clavulanate] Diarrhea, Nausea Only   Boniva [ibandronic Acid] Diarrhea   Codeine Other (See Comments)   "Spaced out feeling" and Lightheadedness   Hydromorphone Nausea And Vomiting   Latex Rash   "Burns me"   Lisinopril Cough   Adhesive [tape] Itching, Rash      Medication List    STOP taking these medications   bisacodyl 10 MG suppository Commonly known as:  DULCOLAX   gabapentin 100 MG capsule Commonly known as:  NEURONTIN   oxyCODONE 5 MG immediate release tablet Commonly known as:  Oxy IR/ROXICODONE   senna 8.6 MG Tabs tablet Commonly known as:  SENOKOT     TAKE these medications   acetaminophen 500 MG tablet Commonly known as:  TYLENOL Take 1,000 mg by mouth every 8 (eight) hours as needed (for pain).   amiodarone 200 MG tablet Commonly known as:  PACERONE Take 1 tablet (200 mg total) by mouth daily.   apixaban 2.5 MG Tabs tablet Commonly known as:  ELIQUIS Take 1 tablet (2.5 mg total) by mouth 2 (two) times daily. PLEASE START ON 02/03/18 AT 9PM What changed:    how much to take  additional instructions   atorvastatin 10 MG tablet Commonly known as:  LIPITOR Take 1 tablet (10 mg total) by mouth daily.   carvedilol 3.125 MG tablet Commonly known as:  COREG Take 1 tablet (3.125 mg total) by mouth 2 (two) times daily with a meal.     FLUTTER Devi Use as directed.   furosemide 20 MG tablet Commonly known as:  LASIX Take 1 tablet (20 mg total) by mouth daily.   latanoprost 0.005 % ophthalmic solution Commonly known as:  XALATAN Place 1 drop into both eyes at bedtime.   mirtazapine 15 MG tablet Commonly known as:  REMERON Take 15 mg by mouth at bedtime.   polyethylene glycol packet Commonly known as:  MIRALAX / GLYCOLAX Take 17 g by mouth daily as needed. What changed:    when to take this  reasons to take this   potassium chloride 10 MEQ tablet Commonly known as:  K-DUR Take one tablet by mouth daily on the days that you take Furosemide 40mg . What changed:    how much to take  how to take this  when to take this  additional instructions   predniSONE 10 MG tablet Commonly known as:  DELTASONE Take 10 mg by mouth daily with breakfast.   traMADol 50 MG tablet Commonly known as:  ULTRAM Take 1 tablet (50 mg total) by mouth every 6 (six) hours as needed. What changed:    how much to take  when to take this  reasons to take this         Contact information for follow-up providers    [place], Crista Luria, PA Follow up on 02/24/2018.   Specialty:  Cardiology Why:  Please arrive 15 minutes early for your 10am post-hospital cardiology follow-up appointment Contact information: [address] [place] [address] [phone]        [name], MD. Schedule an appointment as soon as possible for  a visit in 2 week(s).   Specialty:  Family Medicine Contact information: [address], [address] [place] [address] [phone]            Contact information for after-discharge care    Destination    [facility] .   Service:  Skilled Nursing Contact information: [place] [phone]                  TOTAL DISCHARGE TIME: 39 mins  [facility] Pager [phone]  02/03/2018, 10:27 AM

## 2018-02-03 NOTE — Discharge Instructions (Signed)
1. No stooping,bending or lifting more than 10 lbs for 2 weeks. 2.No driving for  2 weeks . 3 Use walker to ambualte for  2 weeks. 4.RTC PRN 2 weeks      Heart Failure Eating Plan Heart failure, also called congestive heart failure, occurs when your heart does not pump blood well enough to meet your body's needs for oxygen-rich blood. Heart failure is a long-term (chronic) condition. Living with heart failure can be challenging. However, following your health care provider's instructions about a healthy lifestyle and working with a diet and nutrition specialist (dietitian) to choose the right foods may help to improve your symptoms. What are tips for following this plan? General guidelines  Do not eat more than 2,300 mg of salt (sodium) a day. The amount of sodium that is recommended for you may be lower, depending on your condition.  Maintain a healthy body weight as directed. Ask your health care provider what a healthy weight is for you. ? Check your weight every day. ? Work with your health care provider and dietitian to make a plan that is right for you to lose weight or maintain your current weight.  Limit how much fluid you drink. Ask your health care provider or dietitian how much fluid you can have each day.  Limit or avoid alcohol as told by your health care provider or dietitian. Reading food labels  Check food labels for the amount of sodium per serving. Choose foods that have less than 140 mg (milligrams) of sodium in each serving.  Check food labels for the number of calories per serving. This is important if you need to limit your daily calorie intake to lose weight.  Check food labels for the serving size. If you eat more than one serving, you will be eating more sodium and calories than what is listed on the label.  Look for foods that are labeled as "sodium-free," "very low sodium," or "low sodium." ? Foods labeled as "reduced sodium" or "lightly salted" may still have  more sodium than what is recommended for you. Cooking  Avoid adding salt when cooking. Ask your health care provider or dietitian before using salt substitutes.  Season food with salt-free seasonings, spices, or herbs. Check the label of seasoning mixes to make sure they do not contain salt.  Cook with heart-healthy oils, such as olive, canola, soybean, or sunflower oil.  Do not fry foods. Cook foods using low-fat methods, such as baking, boiling, grilling, and broiling.  Limit unhealthy fats when cooking by: ? Removing the skin from poultry, such as chicken. ? Removing all visible fats from meats. ? Skimming the fat off from stews, soups, and gravies before serving them. Meal planning  Limit your intake of: ? Processed, canned, or pre-packaged foods. ? Foods that are high in trans fat, such as fried foods. ? Sweets, desserts, sugary drinks, and other foods with added sugar. ? Full-fat dairy products, such as whole milk.  Eat a balanced diet that includes: ? 4-5 servings of fruit each day and 4-5 servings of vegetables each day. At each meal, try to fill half of your plate with fruits and vegetables. ? Up to 6-8 servings of whole grains each day. ? Up to 2 servings of lean meat, poultry, or fish each day. One serving of meat is equal to 3 oz. This is about the same size as a deck of cards. ? 2 servings of low-fat dairy each day. ? Heart-healthy fats. Healthy fats called  omega-3 fatty acids are found in foods such as flaxseed and cold-water fish like sardines, salmon, and mackerel.  Aim to eat 25-35 g (grams) of fiber a day. Foods that are high in fiber include apples, broccoli, carrots, beans, peas, and whole grains.  Do not add salt or condiments that contain salt (such as soy sauce) to foods before eating.  When eating at a restaurant, ask that your food be prepared with less salt or no salt, if possible.  Try to eat 2 or more vegetarian meals each week.  Eat more home-cooked  food and eat less restaurant, buffet, and fast food. Recommended foods The items listed may not be a complete list. Talk with your dietitian about what dietary choices are best for you. Grains Bread with less than 80 mg of sodium per slice. Whole-wheat pasta, quinoa, and brown rice. Oats and oatmeal. Barley. [place]. Grits and cream of wheat. Whole-grain and whole-wheat cold cereal. Vegetables All fresh vegetables. Vegetables that are frozen without sauce or added salt. Low-sodium or sodium-free canned vegetables. Fruits All fresh, frozen, and canned fruits. Dried fruits, such as raisins, prunes, and cranberries. Meats and other protein foods Lean cuts of meat. Skinless chicken and [place]. Fish with high omega-3 fatty acids, such as salmon, sardines, and other cold-water fishes. Eggs. Dried beans, peas, and edamame. Unsalted nuts and nut butters. Dairy Low-fat or nonfat (skim) milk and dried milk. Rice milk, soy milk, and almond milk. Low-fat or nonfat yogurt. Small amounts of reduced-sodium block cheese. Low-sodium cottage cheese. Fats and oils Olive, canola, soybean, flaxseed, or sunflower oil. Avocado. Sweets and desserts Apple sauce. Granola bars. Sugar-free pudding and gelatin. Frozen fruit bars. Seasoning and other foods Fresh and dried herbs. Lemon or lime juice. Vinegar. Low-sodium ketchup. Salt-free marinades, salad dressings, sauces, and seasonings. Foods to avoid The items listed may not be a complete list. Talk with your dietitian about what dietary choices are best for you. Grains Bread with more than 80 mg of sodium per slice. Hot or cold cereal with more than 140 mg sodium per serving. Salted pretzels and crackers. Pre-packaged breadcrumbs. Bagels, croissants, and biscuits. Vegetables Canned vegetables. Frozen vegetables with sauce or seasonings. Creamed vegetables. [place] fries. Onion rings. Pickled vegetables and sauerkraut. Fruits Fruits that are dried with  sodium-containing preservatives. Meats and other protein foods Ribs and chicken wings. Bacon, ham, pepperoni, bologna, salami, and packaged luncheon meats. Hot dogs, bratwurst, and sausage. Canned meat. Smoked meat and fish. Salted nuts and seeds. Dairy Whole milk, half-and-half, and cream. Buttermilk. Processed cheese, cheese spreads, and cheese curds. Regular cottage cheese. Feta cheese. Shredded cheese. String cheese. Fats and oils Butter, lard, shortening, ghee, and bacon fat. Canned and packaged gravies. Seasoning and other foods Onion salt, garlic salt, table salt, and sea salt. Marinades. Regular salad dressings. Relishes, pickles, and olives. Meat flavorings and tenderizers, and bouillon cubes. Horseradish, ketchup, and mustard. Worcestershire sauce. Teriyaki sauce, soy sauce (including reduced sodium). Hot sauce and Tabasco sauce. Steak sauce, fish sauce, oyster sauce, and cocktail sauce. Taco seasonings. Barbecue sauce. Tartar sauce. Summary  A heart failure eating plan includes changes that limit your intake of sodium and unhealthy fat, and it may help you lose weight or maintain a healthy weight. Your health care provider may also recommend limiting how much fluid you drink.  Most people with heart failure should eat no more than 2,300 mg of salt (sodium) a day. The amount of sodium that is recommended for you may be lower, depending on  your condition.  Contact your health care provider or dietitian before making any major changes to your diet. This information is not intended to replace advice given to you by your health care provider. Make sure you discuss any questions you have with your health care provider. Document Released: 09/13/2016 Document Revised: 09/13/2016 Document Reviewed: 09/13/2016 Elsevier Interactive Patient Education  2018 [employer].    Acute Bronchitis, Adult Acute bronchitis is when air tubes (bronchi) in the lungs suddenly get swollen. The condition can  make it hard to breathe. It can also cause these symptoms:  A cough.  Coughing up clear, yellow, or green mucus.  Wheezing.  Chest congestion.  Shortness of breath.  A fever.  Body aches.  Chills.  A sore throat.  Follow these instructions at home: Medicines  Take over-the-counter and prescription medicines only as told by your doctor.  If you were prescribed an antibiotic medicine, take it as told by your doctor. Do not stop taking the antibiotic even if you start to feel better. General instructions  Rest.  Drink enough fluids to keep your pee (urine) clear or pale yellow.  Avoid smoking and secondhand smoke. If you smoke and you need help quitting, ask your doctor. Quitting will help your lungs heal faster.  Use an inhaler, cool mist vaporizer, or humidifier as told by your doctor.  Keep all follow-up visits as told by your doctor. This is important. How is this prevented? To lower your risk of getting this condition again:  Wash your hands often with soap and water. If you cannot use soap and water, use hand sanitizer.  Avoid contact with people who have cold symptoms.  Try not to touch your hands to your mouth, nose, or eyes.  Make sure to get the flu shot every year.  Contact a doctor if:  Your symptoms do not get better in 2 weeks. Get help right away if:  You cough up blood.  You have chest pain.  You have very bad shortness of breath.  You become dehydrated.  You faint (pass out) or keep feeling like you are going to pass out.  You keep throwing up (vomiting).  You have a very bad headache.  Your fever or chills gets worse. This information is not intended to replace advice given to you by your health care provider. Make sure you discuss any questions you have with your health care provider. Document Released: 10/16/2007 Document Revised: 12/06/2015 Document Reviewed: 10/18/2015 Elsevier Interactive Patient Education  2018 [employer].    Atrial Fibrillation Atrial fibrillation is a type of heartbeat that is irregular or fast (rapid). If you have this condition, your heart keeps quivering in a weird (chaotic) way. This condition can make it so your heart cannot pump blood normally. Having this condition gives a person more risk for stroke, heart failure, and other heart problems. There are different types of atrial fibrillation. Talk with your doctor to learn about the type that you have. Follow these instructions at home:  Take over-the-counter and prescription medicines only as told by your doctor.  If your doctor prescribed a blood-thinning medicine, take it exactly as told. Taking too much of it can cause bleeding. If you do not take enough of it, you will not have the protection that you need against stroke and other problems.  Do not use any tobacco products. These include cigarettes, chewing tobacco, and e-cigarettes. If you need help quitting, ask your doctor.  If you have apnea (obstructive  sleep apnea), manage it as told by your doctor.  Do not drink alcohol.  Do not drink beverages that have caffeine. These include coffee, soda, and tea.  Maintain a healthy weight. Do not use diet pills unless your doctor says they are safe for you. Diet pills may make heart problems worse.  Follow diet instructions as told by your doctor.  Exercise regularly as told by your doctor.  Keep all follow-up visits as told by your doctor. This is important. Contact a doctor if:  You notice a change in the speed, rhythm, or strength of your heartbeat.  You are taking a blood-thinning medicine and you notice more bruising.  You get tired more easily when you move or exercise. Get help right away if:  You have pain in your chest or your belly (abdomen).  You have sweating or weakness.  You feel sick to your stomach (nauseous).  You notice blood in your throw up (vomit), poop (stool), or pee (urine).  You are short  of breath.  You suddenly have swollen feet and ankles.  You feel dizzy.  Your suddenly get weak or numb in your face, arms, or legs, especially if it happens on one side of your body.  You have trouble talking, trouble understanding, or both.  Your face or your eyelid droops on one side. These symptoms may be an emergency. Do not wait to see if the symptoms will go away. Get medical help right away. Call your local emergency services (911 in the [place] Do not drive yourself to the hospital. This information is not intended to replace advice given to you by your health care provider. Make sure you discuss any questions you have with your health care provider. Document Released: 02/06/2008 Document Revised: 10/05/2015 Document Reviewed: 08/24/2014 Elsevier Interactive Patient Education  2018 [employer].   Balloon Kyphoplasty, Care After Refer to this sheet in the next few weeks. These instructions provide you with information about caring for yourself after your procedure. Your health care provider may also give you more specific instructions. Your treatment has been planned according to current medical practices, but problems sometimes occur. Call your health care provider if you have any problems or questions after your procedure. What can I expect after the procedure? After your procedure, it is common to have back pain. Follow these instructions at home: Incision care  Follow instructions from your health care provider about how to take care of your incisions. Make sure you: ? Wash your hands with soap and water before you change your bandage (dressing). If soap and water are not available, use hand sanitizer. ? Change your dressing as told by your health care provider. ? Leave stitches (sutures), skin glue, or adhesive strips in place. These skin closures may need to be in place for 2 weeks or longer. If adhesive strip edges start to loosen and curl up, you may trim the loose edges.  Do not remove adhesive strips completely unless your health care provider tells you to do that.  Check your incision area every day for signs of infection. Watch for: ? Redness, swelling, or pain. ? Fluid, blood, or pus.  Keep your dressing dry until your health care provider says that it can be removed. Activity   Rest your back and avoid intense physical activity for as long as told by your health care provider.  Return to your normal activities as told by your health care provider. Ask your health care provider what activities are safe  for you.  Do not lift anything that is heavier than 10 lb (4.5 kg). This is about the weight of a gallon of milk.You may need to avoid heavy lifting for several weeks. General instructions  Take over-the-counter and prescription medicines only as told by your health care provider.  If directed, apply ice to the painful area: ? Put ice in a plastic bag. ? Place a towel between your skin and the bag. ? Leave the ice on for 20 minutes, 2-3 times per day.  Do not use tobacco products, including cigarettes, chewing tobacco, or e-cigarettes. If you need help quitting, ask your health care provider.  Keep all follow-up visits as told by your health care provider. This is important. Contact a health care provider if:  You have a fever.  You have redness, swelling, or pain at the site of your incisions.  You have fluid, blood, or pus coming from your incisions.  You have pain that gets worse or does not get better with medicine.  You develop numbness or weakness in any part of your body. Get help right away if:  You have chest pain.  You have difficulty breathing.  You cannot move your legs.  You cannot control your bladder or bowel movements.  You suddenly become weak or numb on one side of your body.  You become very confused.  You have trouble speaking or understanding, or both. This information is not intended to replace advice given  to you by your health care provider. Make sure you discuss any questions you have with your health care provider. Document Released: 01/18/2015 Document Revised: 10/05/2015 Document Reviewed: 08/22/2014 Elsevier Interactive Patient Education  [employer].

## 2018-02-03 NOTE — Progress Notes (Addendum)
Physical Therapy Treatment Patient Details Name: Heather Benson MRN: [id number] DOB: 05-24-1931 Today's Date: 02/03/2018    History of Present Illness Patient is a 84 y/o female who presents with lightheadedness and weakness after being lowered onto floor from spouse. CXR-showing infiltrates concerning for pneumonia. Also with sepsis. x-ray of Rt knee showed no fracture or dislocation.  There is a joint effusion.  L1 kyphoplasty 02/02/18.  PMH includes HTN, CKD stage III, shingles, paroxysmal atrial fibrillation    PT Comments    Pt admitted with above diagnosis. Pt currently with functional limitations due to decr strength and endurance as well as balance deficits. Pt needed max assist to stand to [place]. Pt having a difficult time achieving full stand to get pads of Stedy under her.  Will need SNF to gain strength and endurance.  Pt will benefit from skilled PT to increase their independence and safety with mobility to allow discharge to the venue listed below.    SATURATION QUALIFICATIONS: (This note is used to comply with regulatory documentation for home oxygen)  Patient Saturations on Room Air at Rest = 87%  Patient Saturations on Room Air while Transferring = 84%  Patient Saturations on 3 Liters of oxygen while Transferring = 95%  Please briefly explain why patient needs home oxygen:Pt needs 3LO2 at rest and with activity to keep sats >90%.   Follow Up Recommendations  SNF;Supervision for mobility/OOB     Equipment Recommendations  None recommended by PT home O2 needed   Recommendations for Other Services OT consult     Precautions / Restrictions Precautions Precautions: Fall Restrictions Weight Bearing Restrictions: No    Mobility  Bed Mobility Overal bed mobility: Needs Assistance Bed Mobility: Rolling Rolling: Max assist;+2 for physical assistance Sidelying to sit: Max assist;HOB elevated;+2 for physical assistance       General bed mobility comments:  attempted to roll, she required max A to roll.  iNcr assist to EOB due to pain. Incr time to come to EOB.   Transfers Overall transfer level: Needs assistance Equipment used: Charlaine Dalton) Transfers: Sit to/from Stand Sit to Stand: Max assist;+2 physical assistance;From elevated surface         General transfer comment: Used Stedy to get pt OOB.  Pt needed max assist of 2 to stand and get seats under her.  Difficult to maintain standing for longer than 5 seconds.  Moved pt to chair in [place] and then pt stood once more and assisted to chair.    Ambulation/Gait                 Stairs             Wheelchair Mobility    Modified Rankin (Stroke Patients Only)       Balance Overall balance assessment: Needs assistance;History of Falls Sitting-balance support: Feet supported;Bilateral upper extremity supported Sitting balance-Leahy Scale: Poor Sitting balance - Comments: Requires BUE support for sitting EOB, able to get fully upright but was in  pain, sat about 4 min EOB but leaning to her left.  Postural control: Left lateral lean Standing balance support: Bilateral upper extremity supported;During functional activity Standing balance-Leahy Scale: Poor Standing balance comment: Needed +2 max assist and cannot sustain standing fully upright and cannot stand greater than 5 seconds.                             Cognition Arousal/Alertness: Awake/alert Behavior During Therapy: Anxious;WFL for tasks assessed/performed  Overall Cognitive Status: Impaired/Different from baseline Area of Impairment: Memory                     Memory: Decreased short-term memory                Exercises      General Comments        Pertinent Vitals/Pain Pain Assessment: Faces Faces Pain Scale: Hurts whole lot Pain Location: back and Rt knee with movement  Pain Descriptors / Indicators: Discomfort;Grimacing;Guarding Pain Intervention(s): Limited activity within  patient's tolerance;Monitored during session;Repositioned    Home Living                      Prior Function            PT Goals (current goals can now be found in the care plan section) Acute Rehab PT Goals Patient Stated Goal: to have less pain  Progress towards PT goals: Progressing toward goals    Frequency    Min 3X/week      PT Plan Current plan remains appropriate    Co-evaluation              AM-PAC PT "6 Clicks" Daily Activity  Outcome Measure  Difficulty turning over in bed (including adjusting bedclothes, sheets and blankets)?: Unable Difficulty moving from lying on back to sitting on the side of the bed? : Unable Difficulty sitting down on and standing up from a chair with arms (e.g., wheelchair, bedside commode, etc,.)?: Unable Help needed moving to and from a bed to chair (including a wheelchair)?: Total Help needed walking in hospital room?: Total Help needed climbing 3-5 steps with a railing? : Total 6 Click Score: 6    End of Session Equipment Utilized During Treatment: Oxygen;Gait belt Activity Tolerance: Patient limited by pain;Patient limited by fatigue Patient left: with call bell/phone within reach;in chair;with family/visitor present Nurse Communication: Mobility status;Need for lift equipment(Use MAximove) PT Visit Diagnosis: Pain;Muscle weakness (generalized) (M62.81);Difficulty in walking, not elsewhere classified (R26.2) Pain - Right/Left: Right Pain - part of body: Leg(back)     Time: 0034-9179 PT Time Calculation (min) (ACUTE ONLY): 34 min  Charges:  $Therapeutic Activity: 23-37 mins                     [place] Pager:  [phone]  Office:  [place] 02/03/2018, 9:58 AM

## 2018-02-03 NOTE — Telephone Encounter (Addendum)
Per note dated today (9/24) in the pts charted:   CLINICAL SOCIAL WORK PLACEMENT  NOTE  Date:  02/03/2018  Patient Details  Name: Heather Benson MRN: [id number] Date of Birth: 1932-04-25  Clinical Social Work is seeking post-discharge placement for this patient [facility]     If the pt is released to a nursing facility this TCM call will not be needed. If TCM call is needed we will call tomorrow.

## 2018-02-03 NOTE — Telephone Encounter (Signed)
TOC Patient-  Please call Patient-  Pt has an appointment with Sharyn Lull on 02-19-18.

## 2018-02-03 NOTE — Progress Notes (Signed)
Hypoglycemic Event  CBG: 68 at 0729   Treatment: OJ x1 given to patient by NT  Symptoms: none  Follow-up CBG: Time:87 CBG Result:1137  Possible Reasons for Event:   Comments/MD notified:    Nechama Guard

## 2018-02-03 NOTE — Progress Notes (Signed)
Progress Note  Patient Name: Heather Benson Date of Encounter: 02/03/2018  Primary Cardiologist: Larae Grooms, MD   Subjective   Back pain has resolved post kyphoplasty.  No chest pain fevers chills nausea vomiting shortness of breath  Inpatient Medications    Scheduled Meds: . amiodarone  200 mg Oral Daily  . aspirin EC  81 mg Oral Daily  . atorvastatin  10 mg Oral Daily  . carvedilol  3.125 mg Oral BID WC  . latanoprost  1 drop Both Eyes QHS  . mirtazapine  15 mg Oral QHS   Continuous Infusions: . sodium chloride Stopped (02/02/18 1804)  . heparin Stopped (02/02/18 0926)   PRN Meds: acetaminophen **OR** acetaminophen, ondansetron **OR** ondansetron (ZOFRAN) IV   Vital Signs    Vitals:   02/02/18 2020 02/03/18 0000 02/03/18 0400 02/03/18 0735  BP: (!) 105/54 102/61 119/66 127/63  Pulse: 61 (!) 59 61 63  Resp: (!) 25 19 19  (!) 23  Temp:  97.9 F (36.6 C) 98 F (36.7 C) (!) 97.4 F (36.3 C)  TempSrc:  Oral Oral Axillary  SpO2: 94% 97% 98% 100%  Weight:   75.2 kg   Height:        Intake/Output Summary (Last 24 hours) at 02/03/2018 0836 Last data filed at 02/03/2018 0600 Gross per 24 hour  Intake 261.82 ml  Output 0 ml  Net 261.82 ml   Filed Weights   02/01/18 0443 02/02/18 0455 02/03/18 0400  Weight: 76.3 kg 75.8 kg 75.2 kg    Telemetry    Normal sinus rhythm- Personally Reviewed  ECG    Normal sinus rhythm- Personally Reviewed  Physical Exam   GEN: No acute distress.  Elderly, comfortable laying in bed Neck: No JVD Cardiac: RRR, no murmurs, rubs, or gallops.  Respiratory: Clear to auscultation bilaterally. GI: Soft, nontender, non-distended  MS: No edema; No deformity. Neuro:  Nonfocal  Psych: Normal affect   Labs    Chemistry Recent Labs  Lab 01/28/18 0605 01/29/18 0458 01/30/18 0550  02/01/18 0436 02/02/18 0618 02/03/18 0543  NA 143 144 147*   < > 144 145 145  K 4.2 3.9 3.6   < > 3.7 3.8 3.8  CL 108 108 109   < > 107  108 111  CO2 24 30 28    < > 28 28 26   GLUCOSE 103* 106* 79   < > 89 84 87  BUN 25* 31* 29*   < > 27* 22 23  CREATININE 1.52* 1.72* 1.68*   < > 1.84* 1.68* 1.63*  CALCIUM 8.5* 8.6* 8.7*   < > 8.4* 8.7* 8.5*  PROT  --  4.6*  --   --   --   --   --   ALBUMIN 2.1* 2.1* 2.1*  --   --   --   --   AST  --  28  --   --   --   --   --   ALT  --  30  --   --   --   --   --   ALKPHOS  --  103  --   --   --   --   --   BILITOT  --  0.8  --   --   --   --   --   GFRNONAA 30* 26* 27*   < > 24* 27* 28*  GFRAA 35* 30* 31*   < > 28* 31* 32*  ANIONGAP  11 6 10    < > 9 9 8    < > = values in this interval not displayed.     Hematology Recent Labs  Lab 02/01/18 0436 02/02/18 0618 02/03/18 0543  WBC 6.8 7.3 7.8  RBC 4.36 4.44 4.29  HGB 13.0 13.1 12.8  HCT 42.8 43.1 42.8  MCV 98.2 97.1 99.8  MCH 29.8 29.5 29.8  MCHC 30.4 30.4 29.9*  RDW 16.6* 16.5* 16.5*  PLT 136* 138* 164    Cardiac EnzymesNo results for input(s): TROPONINI in the last 168 hours. No results for input(s): TROPIPOC in the last 168 hours.   BNPNo results for input(s): BNP, PROBNP in the last 168 hours.   DDimer No results for input(s): DDIMER in the last 168 hours.   Radiology    No results found.  Cardiac Studies   Echocardiogram 01/31/2018-EF 35 to 40%, mild aortic stenosis, PA pressure 33 mmHg  Patient Profile     84 y.o. 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  Assessment & Plan    Type II myocardial infarction - Mildly elevated troponin 0.91.  Conservative management.  Patient did not wish to proceed with cardiac catheterization.  In 2012 she did have mild coronary artery disease by cardiac catheterization.  Continue with secondary  prevention. -Once Eliquis is restarted post kyphoplasty, possibly this evening at [facility], I would stop her aspirin 81 mg and use monotherapy with Eliquis only. -Continue with low-dose beta-blocker, statin  Paroxysmal atrial fibrillation/AVNRT -Normal sinus rhythm - Eliquis restart when comfortable post procedure -Amiodarone 200 mg a day -Low-dose beta-blocker  Dilated cardiomyopathy - Low-dose beta-blocker, carvedilol 3.125 mg twice a day -No ACE inhibitor or angiotensin receptor blocker because of chronic kidney disease.  Essential hypertension -Very well controlled.  No changes in medications  Hyperlipidemia -LDL 71, continue with atorvastatin  Secondary pulmonary hypertension -Improved on second echocardiogram.  Chronic kidney disease -Creatinine from 1.6-1.9 stable.  Does not wish to proceed with hemodialysis if it ever does get to that point.  Has spoken with nephrology.  Okay with discharge with follow-up in approximately 2 to 4 weeks in clinic.  [facility] will sign off.   Medication Recommendations: As above. Other recommendations (labs, testing, etc): Continue to monitor basic meta blood profile with her chronic kidney disease Follow up as an outpatient: 2 to [place]. office, Dr. Irish Lack APP team.  For questions or updates, please contact [place] Please consult [email or web address] for contact info under        Signed, Candee Furbish, MD  02/03/2018, 8:36 AM

## 2018-02-03 NOTE — Progress Notes (Signed)
SATURATION QUALIFICATIONS: (This note is used to comply with regulatory documentation for home oxygen)  Patient Saturations on Room Air at Rest = 87%  Patient Saturations on Room Air while Transferring = 84%  Patient Saturations on 3 Liters of oxygen while Transferring = 95%  Please briefly explain why patient needs home oxygen:Pt needs 3LO2 at rest and with activity to keep sats >90%.  Thanks.   [place] Pager:  [phone]  Office:  [phone]

## 2018-02-03 NOTE — Care Management Important Message (Signed)
Important Message  Patient Details  Name: DARCEE DEKKER MRN: [id number] Date of Birth: 08-12-1931   Medicare Important Message Given:  Yes    Barb Merino Kaari Zeigler 02/03/2018, 3:34 PM

## 2018-02-03 NOTE — Progress Notes (Signed)
Patient will discharge to [facility]. Anticipated discharge date: 02/03/18 Family notified: Mliss Wedin, spouse Transportation by: Corey Harold  Nurse to call report to [phone]. Patient will go to room 203 at the facility.   CSW signing off.  Estanislado Emms, [place]  Clinical Social Worker

## 2018-02-03 NOTE — Clinical Social Work Placement (Signed)
   CLINICAL SOCIAL WORK PLACEMENT  NOTE  Date:  02/03/2018  Patient Details  Name: Heather Benson MRN: [id number] Date of Birth: May 26, 1931  Clinical Social Work is seeking post-discharge placement for this patient at the [place] level of care (*CSW will initial, date and re-position this form in  chart as items are completed):  Yes   Patient/family provided with [place] Work Department's list of facilities offering this level of care within the geographic area requested by the patient (or if unable, by the patient's family).  Yes   Patient/family informed of their freedom to choose among providers that offer the needed level of care, that participate in Medicare, Medicaid or managed care program needed by the patient, have an available bed and are willing to accept the patient.  Yes   Patient/family informed of [facility]'s ownership interest in [facility] and [facility], as well as of the fact that they are under no obligation to receive care at these facilities.  PASRR submitted to EDS on       PASRR number received on       Existing PASRR number confirmed on 02/01/18     FL2 transmitted to all facilities in geographic area requested by pt/family on 02/01/18     FL2 transmitted to all facilities within larger geographic area on       Patient informed that his/her managed care company has contracts with or will negotiate with certain facilities, including the following:  [facility], [facility]     Yes   Patient/family informed of bed offers received.  Patient chooses bed at [place], [place]     Physician recommends and patient chooses bed at      Patient to be transferred to [facility] on 02/03/18.  Patient to be transferred to facility by [facility]     Patient family notified on 02/03/18 of transfer.  Name of family member notified:        PHYSICIAN Please prepare priority discharge summary, including  medications, Please prepare prescriptions, Please sign DNR     Additional Comment:    _______________________________________________ Estanislado Emms, LCSW 02/03/2018, 10:10 AM

## 2018-02-03 NOTE — Plan of Care (Signed)
  Problem: Health Behavior/Discharge Planning: Goal: Ability to manage health-related needs will improve Outcome: Progressing   Problem: Activity: Goal: Risk for activity intolerance will decrease Outcome: Progressing   Problem: Nutrition: Goal: Adequate nutrition will be maintained Outcome: Progressing   Problem: Coping: Goal: Level of anxiety will decrease Outcome: Progressing   Problem: Elimination: Goal: Will not experience complications related to bowel motility Outcome: Progressing Goal: Will not experience complications related to urinary retention Outcome: Progressing   Problem: Pain Managment: Goal: General experience of comfort will improve Outcome: Progressing   Problem: Skin Integrity: Goal: Risk for impaired skin integrity will decrease Outcome: Progressing

## 2018-02-04 NOTE — Telephone Encounter (Signed)
Pt was discharged from the hospital yesterday, to [facility] facility. No TCM call to be placed. Pt will follow-up with Cards as planned at discharge from the hospital yesterday.

## 2018-02-05 ENCOUNTER — Other Ambulatory Visit: Payer: Self-pay | Admitting: *Deleted

## 2018-02-05 ENCOUNTER — Encounter (HOSPITAL_COMMUNITY): Payer: Self-pay | Admitting: Interventional Radiology

## 2018-02-05 DIAGNOSIS — I480 Paroxysmal atrial fibrillation: Secondary | ICD-10-CM | POA: Diagnosis not present

## 2018-02-05 DIAGNOSIS — S32020A Wedge compression fracture of second lumbar vertebra, initial encounter for closed fracture: Secondary | ICD-10-CM | POA: Diagnosis not present

## 2018-02-05 DIAGNOSIS — J209 Acute bronchitis, unspecified: Secondary | ICD-10-CM | POA: Diagnosis not present

## 2018-02-05 DIAGNOSIS — N184 Chronic kidney disease, stage 4 (severe): Secondary | ICD-10-CM | POA: Diagnosis not present

## 2018-02-05 DIAGNOSIS — I5043 Acute on chronic combined systolic (congestive) and diastolic (congestive) heart failure: Secondary | ICD-10-CM | POA: Diagnosis not present
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
## 2018-02-10 DIAGNOSIS — M25572 Pain in left ankle and joints of left foot: Secondary | ICD-10-CM | POA: Diagnosis not present

## 2018-02-10 DIAGNOSIS — J9811 Atelectasis: Secondary | ICD-10-CM | POA: Diagnosis not present

## 2018-02-10 DIAGNOSIS — I480 Paroxysmal atrial fibrillation: Secondary | ICD-10-CM | POA: Diagnosis not present

## 2018-02-10 DIAGNOSIS — L603 Nail dystrophy: Secondary | ICD-10-CM | POA: Diagnosis not present

## 2018-02-10 DIAGNOSIS — Z8719 Personal history of other diseases of the digestive system: Secondary | ICD-10-CM | POA: Diagnosis not present

## 2018-02-10 DIAGNOSIS — Z4789 Encounter for other orthopedic aftercare: Secondary | ICD-10-CM | POA: Diagnosis not present

## 2018-02-10 DIAGNOSIS — R2681 Unsteadiness on feet: Secondary | ICD-10-CM | POA: Diagnosis not present

## 2018-02-10 DIAGNOSIS — M25561 Pain in right knee: Secondary | ICD-10-CM | POA: Diagnosis not present

## 2018-02-10 DIAGNOSIS — R278 Other lack of coordination: Secondary | ICD-10-CM | POA: Diagnosis not present

## 2018-02-10 DIAGNOSIS — E785 Hyperlipidemia, unspecified: Secondary | ICD-10-CM | POA: Diagnosis not present

## 2018-02-10 DIAGNOSIS — N2889 Other specified disorders of kidney and ureter: Secondary | ICD-10-CM | POA: Diagnosis not present

## 2018-02-10 DIAGNOSIS — M6281 Muscle weakness (generalized): Secondary | ICD-10-CM | POA: Diagnosis not present

## 2018-02-10 DIAGNOSIS — H409 Unspecified glaucoma: Secondary | ICD-10-CM | POA: Diagnosis not present

## 2018-02-10 DIAGNOSIS — R52 Pain, unspecified: Secondary | ICD-10-CM | POA: Diagnosis not present

## 2018-02-10 DIAGNOSIS — K5900 Constipation, unspecified: Secondary | ICD-10-CM | POA: Diagnosis not present

## 2018-02-10 DIAGNOSIS — N184 Chronic kidney disease, stage 4 (severe): Secondary | ICD-10-CM | POA: Diagnosis not present

## 2018-02-10 DIAGNOSIS — S32010D Wedge compression fracture of first lumbar vertebra, subsequent encounter for fracture with routine healing: Secondary | ICD-10-CM | POA: Diagnosis not present

## 2018-02-10 DIAGNOSIS — M545 Low back pain: Secondary | ICD-10-CM | POA: Diagnosis not present

## 2018-02-10 DIAGNOSIS — M6389 Disorders of muscle in diseases classified elsewhere, multiple sites: Secondary | ICD-10-CM | POA: Diagnosis not present

## 2018-02-10 DIAGNOSIS — J209 Acute bronchitis, unspecified: Secondary | ICD-10-CM | POA: Diagnosis not present

## 2018-02-10 DIAGNOSIS — I5042 Chronic combined systolic (congestive) and diastolic (congestive) heart failure: Secondary | ICD-10-CM | POA: Diagnosis not present

## 2018-02-10 DIAGNOSIS — I10 Essential (primary) hypertension: Secondary | ICD-10-CM | POA: Diagnosis not present

## 2018-02-10 DIAGNOSIS — I739 Peripheral vascular disease, unspecified: Secondary | ICD-10-CM | POA: Diagnosis not present

## 2018-02-10 DIAGNOSIS — D696 Thrombocytopenia, unspecified: Secondary | ICD-10-CM | POA: Diagnosis not present

## 2018-02-10 DIAGNOSIS — B351 Tinea unguium: Secondary | ICD-10-CM | POA: Diagnosis not present

## 2018-02-10 DIAGNOSIS — E440 Moderate protein-calorie malnutrition: Secondary | ICD-10-CM | POA: Diagnosis not present

## 2018-02-12 ENCOUNTER — Other Ambulatory Visit: Payer: Self-pay | Admitting: *Deleted

## 2018-02-12 NOTE — Patient Outreach (Signed)
[facility]) Care Management  02/12/2018  JOHNAY MANO 1932/01/24 [id number]   Attended discharge planning meeting at [facility] center with [facility] UM team members.  PT discussed patient stating she was continuing to make progress but is still having some pain.  PT stating staff is continuing to use lift for transfers with patient.  Nursing stating patient has very supportive and involved spouse and son.  Nursing stating there is not a discharge date set, predicting 1 to 2 more weeks.  Attempted to see patient in her room but patient was not there.  Left a [facility] CM Packet at bedside and plan to follow up next week.   Made [facility] UM team member plan to follow up next week.  Rutherford Limerick RN, BSN [place] Acute Care Coordinator [phone]) Business Mobile [phone]) Toll free office

## 2018-02-19 ENCOUNTER — Other Ambulatory Visit: Payer: Self-pay | Admitting: *Deleted

## 2018-02-19 NOTE — Patient Outreach (Signed)
[facility]) Care Management  02/19/2018  [name] 08-26-1931 [id number]   Attended discharge planning meeting at [place] with [place] team members [place] and [place].  PT stated patient is progressing and pain has improved.  Nursing stated there were no new meds.  Discharge planner stated patient was well supported with an involved family.  Met with patient and spouse at patient's bedside.  Explained [employer] Education officer, community. Spouse stated they have no issues with transportation or medication affordability.  Patient stated she has 3 sons and daughter in laws who are very supportive.  Patient and spouse decline [facility] services at this time.  [facility] information left with patient and spouse.   Plan to make [facility] UM team aware of patient's decision.   Rutherford Limerick RN, BSN [place] Acute Care Coordinator [phone]) Business Mobile [phone]) Toll free office

## 2018-02-24 ENCOUNTER — Ambulatory Visit: Payer: Medicare Other | Admitting: Physician Assistant

## 2018-02-27 DIAGNOSIS — M810 Age-related osteoporosis without current pathological fracture: Secondary | ICD-10-CM | POA: Diagnosis not present

## 2018-02-27 DIAGNOSIS — E440 Moderate protein-calorie malnutrition: Secondary | ICD-10-CM | POA: Diagnosis not present

## 2018-02-27 DIAGNOSIS — I480 Paroxysmal atrial fibrillation: Secondary | ICD-10-CM | POA: Diagnosis not present

## 2018-02-27 DIAGNOSIS — I5042 Chronic combined systolic (congestive) and diastolic (congestive) heart failure: Secondary | ICD-10-CM | POA: Diagnosis not present

## 2018-02-27 DIAGNOSIS — Z96651 Presence of right artificial knee joint: Secondary | ICD-10-CM | POA: Diagnosis not present

## 2018-02-27 DIAGNOSIS — Z4789 Encounter for other orthopedic aftercare: Secondary | ICD-10-CM | POA: Diagnosis not present

## 2018-02-27 DIAGNOSIS — Z9181 History of falling: Secondary | ICD-10-CM | POA: Diagnosis not present

## 2018-02-27 DIAGNOSIS — N184 Chronic kidney disease, stage 4 (severe): Secondary | ICD-10-CM | POA: Diagnosis not present

## 2018-02-27 DIAGNOSIS — J209 Acute bronchitis, unspecified: Secondary | ICD-10-CM | POA: Diagnosis not present

## 2018-02-27 DIAGNOSIS — S32020D Wedge compression fracture of second lumbar vertebra, subsequent encounter for fracture with routine healing: Secondary | ICD-10-CM | POA: Diagnosis not present

## 2018-02-27 DIAGNOSIS — Z7952 Long term (current) use of systemic steroids: Secondary | ICD-10-CM | POA: Diagnosis not present

## 2018-02-27 DIAGNOSIS — M8580 Other specified disorders of bone density and structure, unspecified site: Secondary | ICD-10-CM | POA: Diagnosis not present

## 2018-02-27 DIAGNOSIS — S32030D Wedge compression fracture of third lumbar vertebra, subsequent encounter for fracture with routine healing: Secondary | ICD-10-CM | POA: Diagnosis not present

## 2018-02-27 DIAGNOSIS — H409 Unspecified glaucoma: Secondary | ICD-10-CM | POA: Diagnosis not present

## 2018-02-27 DIAGNOSIS — Z7901 Long term (current) use of anticoagulants: Secondary | ICD-10-CM | POA: Diagnosis not present

## 2018-02-27 DIAGNOSIS — M5135 Other intervertebral disc degeneration, thoracolumbar region: Secondary | ICD-10-CM | POA: Diagnosis not present

## 2018-02-27 DIAGNOSIS — I130 Hypertensive heart and chronic kidney disease with heart failure and stage 1 through stage 4 chronic kidney disease, or unspecified chronic kidney disease: Secondary | ICD-10-CM | POA: Diagnosis not present

## 2018-02-27 DIAGNOSIS — Z9981 Dependence on supplemental oxygen: Secondary | ICD-10-CM | POA: Diagnosis not present

## 2018-03-02 DIAGNOSIS — Z4789 Encounter for other orthopedic aftercare: Secondary | ICD-10-CM | POA: Diagnosis not present

## 2018-03-02 DIAGNOSIS — I5042 Chronic combined systolic (congestive) and diastolic (congestive) heart failure: Secondary | ICD-10-CM | POA: Diagnosis not present

## 2018-03-02 DIAGNOSIS — S32020D Wedge compression fracture of second lumbar vertebra, subsequent encounter for fracture with routine healing: Secondary | ICD-10-CM | POA: Diagnosis not present

## 2018-03-02 DIAGNOSIS — S32030D Wedge compression fracture of third lumbar vertebra, subsequent encounter for fracture with routine healing: Secondary | ICD-10-CM | POA: Diagnosis not present

## 2018-03-02 DIAGNOSIS — J209 Acute bronchitis, unspecified: Secondary | ICD-10-CM | POA: Diagnosis not present

## 2018-03-02 DIAGNOSIS — I130 Hypertensive heart and chronic kidney disease with heart failure and stage 1 through stage 4 chronic kidney disease, or unspecified chronic kidney disease: Secondary | ICD-10-CM | POA: Diagnosis not present

## 2018-03-03 ENCOUNTER — Other Ambulatory Visit: Payer: Self-pay | Admitting: Interventional Cardiology

## 2018-03-04 DIAGNOSIS — S32030D Wedge compression fracture of third lumbar vertebra, subsequent encounter for fracture with routine healing: Secondary | ICD-10-CM | POA: Diagnosis not present

## 2018-03-04 DIAGNOSIS — S32020D Wedge compression fracture of second lumbar vertebra, subsequent encounter for fracture with routine healing: Secondary | ICD-10-CM | POA: Diagnosis not present

## 2018-03-04 DIAGNOSIS — I130 Hypertensive heart and chronic kidney disease with heart failure and stage 1 through stage 4 chronic kidney disease, or unspecified chronic kidney disease: Secondary | ICD-10-CM | POA: Diagnosis not present

## 2018-03-04 DIAGNOSIS — Z4789 Encounter for other orthopedic aftercare: Secondary | ICD-10-CM | POA: Diagnosis not present

## 2018-03-04 DIAGNOSIS — J209 Acute bronchitis, unspecified: Secondary | ICD-10-CM | POA: Diagnosis not present

## 2018-03-04 DIAGNOSIS — I5042 Chronic combined systolic (congestive) and diastolic (congestive) heart failure: Secondary | ICD-10-CM | POA: Diagnosis not present

## 2018-03-05 DIAGNOSIS — S32030D Wedge compression fracture of third lumbar vertebra, subsequent encounter for fracture with routine healing: Secondary | ICD-10-CM | POA: Diagnosis not present

## 2018-03-05 DIAGNOSIS — Z4789 Encounter for other orthopedic aftercare: Secondary | ICD-10-CM | POA: Diagnosis not present

## 2018-03-05 DIAGNOSIS — I5042 Chronic combined systolic (congestive) and diastolic (congestive) heart failure: Secondary | ICD-10-CM | POA: Diagnosis not present

## 2018-03-05 DIAGNOSIS — I130 Hypertensive heart and chronic kidney disease with heart failure and stage 1 through stage 4 chronic kidney disease, or unspecified chronic kidney disease: Secondary | ICD-10-CM | POA: Diagnosis not present

## 2018-03-05 DIAGNOSIS — S32020D Wedge compression fracture of second lumbar vertebra, subsequent encounter for fracture with routine healing: Secondary | ICD-10-CM | POA: Diagnosis not present

## 2018-03-05 DIAGNOSIS — J209 Acute bronchitis, unspecified: Secondary | ICD-10-CM | POA: Diagnosis not present

## 2018-03-06 DIAGNOSIS — N184 Chronic kidney disease, stage 4 (severe): Secondary | ICD-10-CM | POA: Diagnosis not present

## 2018-03-06 DIAGNOSIS — I480 Paroxysmal atrial fibrillation: Secondary | ICD-10-CM | POA: Diagnosis not present

## 2018-03-06 DIAGNOSIS — M8000XS Age-related osteoporosis with current pathological fracture, unspecified site, sequela: Secondary | ICD-10-CM | POA: Diagnosis not present

## 2018-03-06 DIAGNOSIS — Z23 Encounter for immunization: Secondary | ICD-10-CM | POA: Diagnosis not present

## 2018-03-06 DIAGNOSIS — F324 Major depressive disorder, single episode, in partial remission: Secondary | ICD-10-CM | POA: Diagnosis not present

## 2018-03-06 DIAGNOSIS — E441 Mild protein-calorie malnutrition: Secondary | ICD-10-CM | POA: Diagnosis not present

## 2018-03-06 DIAGNOSIS — I129 Hypertensive chronic kidney disease with stage 1 through stage 4 chronic kidney disease, or unspecified chronic kidney disease: Secondary | ICD-10-CM | POA: Diagnosis not present

## 2018-03-10 DIAGNOSIS — J209 Acute bronchitis, unspecified: Secondary | ICD-10-CM | POA: Diagnosis not present

## 2018-03-10 DIAGNOSIS — I130 Hypertensive heart and chronic kidney disease with heart failure and stage 1 through stage 4 chronic kidney disease, or unspecified chronic kidney disease: Secondary | ICD-10-CM | POA: Diagnosis not present

## 2018-03-10 DIAGNOSIS — Z4789 Encounter for other orthopedic aftercare: Secondary | ICD-10-CM | POA: Diagnosis not present

## 2018-03-10 DIAGNOSIS — S32030D Wedge compression fracture of third lumbar vertebra, subsequent encounter for fracture with routine healing: Secondary | ICD-10-CM | POA: Diagnosis not present

## 2018-03-10 DIAGNOSIS — S32020D Wedge compression fracture of second lumbar vertebra, subsequent encounter for fracture with routine healing: Secondary | ICD-10-CM | POA: Diagnosis not present

## 2018-03-10 DIAGNOSIS — I5042 Chronic combined systolic (congestive) and diastolic (congestive) heart failure: Secondary | ICD-10-CM | POA: Diagnosis not present

## 2018-03-11 ENCOUNTER — Other Ambulatory Visit: Payer: Self-pay | Admitting: Interventional Cardiology

## 2018-03-11 MED ORDER — AMIODARONE HCL 200 MG PO TABS: 200.0000 mg | ORAL_TABLET | Freq: Every day | ORAL | 1 refills | Status: DC

## 2018-03-11 NOTE — Addendum Note (Signed)
Addended by: Juventino Slovak on: 03/11/2018 04:52 PM   Modules accepted: Orders

## 2018-03-12 ENCOUNTER — Ambulatory Visit: Payer: Medicare Other | Admitting: Physician Assistant

## 2018-03-12 DIAGNOSIS — J209 Acute bronchitis, unspecified: Secondary | ICD-10-CM | POA: Diagnosis not present

## 2018-03-12 DIAGNOSIS — S32030D Wedge compression fracture of third lumbar vertebra, subsequent encounter for fracture with routine healing: Secondary | ICD-10-CM | POA: Diagnosis not present

## 2018-03-12 DIAGNOSIS — I130 Hypertensive heart and chronic kidney disease with heart failure and stage 1 through stage 4 chronic kidney disease, or unspecified chronic kidney disease: Secondary | ICD-10-CM | POA: Diagnosis not present

## 2018-03-12 DIAGNOSIS — Z4789 Encounter for other orthopedic aftercare: Secondary | ICD-10-CM | POA: Diagnosis not present

## 2018-03-12 DIAGNOSIS — I5042 Chronic combined systolic (congestive) and diastolic (congestive) heart failure: Secondary | ICD-10-CM | POA: Diagnosis not present

## 2018-03-12 DIAGNOSIS — S32020D Wedge compression fracture of second lumbar vertebra, subsequent encounter for fracture with routine healing: Secondary | ICD-10-CM | POA: Diagnosis not present

## 2018-03-17 DIAGNOSIS — J209 Acute bronchitis, unspecified: Secondary | ICD-10-CM | POA: Diagnosis not present

## 2018-03-17 DIAGNOSIS — I5042 Chronic combined systolic (congestive) and diastolic (congestive) heart failure: Secondary | ICD-10-CM | POA: Diagnosis not present

## 2018-03-17 DIAGNOSIS — S32030D Wedge compression fracture of third lumbar vertebra, subsequent encounter for fracture with routine healing: Secondary | ICD-10-CM | POA: Diagnosis not present

## 2018-03-17 DIAGNOSIS — I130 Hypertensive heart and chronic kidney disease with heart failure and stage 1 through stage 4 chronic kidney disease, or unspecified chronic kidney disease: Secondary | ICD-10-CM | POA: Diagnosis not present

## 2018-03-17 DIAGNOSIS — S32020D Wedge compression fracture of second lumbar vertebra, subsequent encounter for fracture with routine healing: Secondary | ICD-10-CM | POA: Diagnosis not present

## 2018-03-17 DIAGNOSIS — Z4789 Encounter for other orthopedic aftercare: Secondary | ICD-10-CM | POA: Diagnosis not present

## 2018-03-19 DIAGNOSIS — I130 Hypertensive heart and chronic kidney disease with heart failure and stage 1 through stage 4 chronic kidney disease, or unspecified chronic kidney disease: Secondary | ICD-10-CM | POA: Diagnosis not present

## 2018-03-19 DIAGNOSIS — I5042 Chronic combined systolic (congestive) and diastolic (congestive) heart failure: Secondary | ICD-10-CM | POA: Diagnosis not present

## 2018-03-19 DIAGNOSIS — J209 Acute bronchitis, unspecified: Secondary | ICD-10-CM | POA: Diagnosis not present

## 2018-03-19 DIAGNOSIS — Z4789 Encounter for other orthopedic aftercare: Secondary | ICD-10-CM | POA: Diagnosis not present

## 2018-03-19 DIAGNOSIS — S32030D Wedge compression fracture of third lumbar vertebra, subsequent encounter for fracture with routine healing: Secondary | ICD-10-CM | POA: Diagnosis not present

## 2018-03-19 DIAGNOSIS — S32020D Wedge compression fracture of second lumbar vertebra, subsequent encounter for fracture with routine healing: Secondary | ICD-10-CM | POA: Diagnosis not present

## 2018-03-20 ENCOUNTER — Ambulatory Visit (INDEPENDENT_AMBULATORY_CARE_PROVIDER_SITE_OTHER): Payer: Medicare Other | Admitting: Physician Assistant

## 2018-03-20 ENCOUNTER — Encounter: Payer: Self-pay | Admitting: Physician Assistant

## 2018-03-20 VITALS — BP 132/74 | HR 78 | Ht 64.0 in | Wt 153.0 lb

## 2018-03-20 DIAGNOSIS — I5042 Chronic combined systolic (congestive) and diastolic (congestive) heart failure: Secondary | ICD-10-CM | POA: Diagnosis not present

## 2018-03-20 DIAGNOSIS — Z79899 Other long term (current) drug therapy: Secondary | ICD-10-CM | POA: Diagnosis not present

## 2018-03-20 DIAGNOSIS — N184 Chronic kidney disease, stage 4 (severe): Secondary | ICD-10-CM | POA: Diagnosis not present

## 2018-03-20 DIAGNOSIS — I420 Dilated cardiomyopathy: Secondary | ICD-10-CM

## 2018-03-20 DIAGNOSIS — I480 Paroxysmal atrial fibrillation: Secondary | ICD-10-CM

## 2018-03-20 HISTORY — DX: Dilated cardiomyopathy: I42.0

## 2018-03-20 MED ORDER — NITROGLYCERIN 0.4 MG SL SUBL: 0.4000 mg | SUBLINGUAL_TABLET | SUBLINGUAL | 3 refills | Status: AC | PRN

## 2018-03-20 MED ORDER — CARVEDILOL 6.25 MG PO TABS: 6.2500 mg | ORAL_TABLET | Freq: Two times a day (BID) | ORAL | 3 refills | Status: DC

## 2018-03-20 NOTE — Patient Instructions (Signed)
Medication Instructions:  Your physician has recommended you make the following change in your medication:  1. INCREASE COREG TO 6.25 MG TWICE DAILY.  2. RX SENT IN TO YOUR PHARMACY FOR NITROGLYCERIN 0.4 MG SUBLINGUAL TABLET AS NEEDED FOR CHEST PAIN.  If you need a refill on your cardiac medications before your next appointment, please call your pharmacy.   Lab work: NONE If you have labs (blood work) drawn today and your tests are completely normal, you will receive your results only by: [name] MyChart Message (if you have MyChart) OR . A paper copy in the mail If you have any lab test that is abnormal or we need to change your treatment, we will call you to review the results.  Testing/Procedures: NONE  Follow-Up: At [facility], you and your health needs are our priority.  As part of our continuing mission to provide you with exceptional heart care, we have created designated Provider Care Teams.  These Care Teams include your primary Cardiologist (physician) and Advanced Practice Providers (APPs -  Physician Assistants and Nurse Practitioners) who all work together to provide you with the care you need, when you need it. You will need a follow up appointment in 4-6 weeks.  Please call our office 2 months in advance to schedule this appointment.  You may see Larae Grooms, MD or one of the following Advanced Practice Providers on your designated Care Team:   [place], PA-C Melina Copa, PA-C . Ermalinda Barrios, PA-C  Any Other Special Instructions Will Be Listed Below (If Applicable).

## 2018-03-20 NOTE — Progress Notes (Signed)
Cardiology Office Note:    Date:  03/20/2018   ID:  [name], DOB Apr 09, 1932, MRN [id number]  PCP:  [name], MD  Cardiologist:  Larae Grooms, MD  Electrophysiologist:  None  Nephrologist: Dr. Lorrene Reid  Referring MD: [name], MD   Chief Complaint  Patient presents with  . Hospitalization Follow-up    ?pneumonia; NSTEMI; CHF    History of Present Illness:    Heather Benson is a 84 y.o. female with paroxysmal atrial fibrillation s/p cardioversion in the past, maintained on amiodarone therapy, hypertension, acid reflux, chronic kidney disease, renal cysts, SVT, vertebral compression fracture status post prior kyphoplasty.  She was admitted in August 2018 with lower GI bleeding from a diverticulum.  She required blood transfusion.  She did have SVT during that admission.  She was ultimately placed back on Apixaban.  She was seen in the office in March 2019 with recurrent palpitations and amiodarone was restarted.  She was last seen in clinic by Cecilie Kicks, NP 11/2017.    She was 9/16-9/24 with suspected pneumonia.  She was initially scheduled for kyphoplasty on the day of admission, but presented with profound weakness, fever and hypotension.  DDimer was neg for pulmonary embolism.  She was seen by Cardiology for an elevated Troponin.   Echocardiogram demonstrated reduced LV function with an EF of 35-40% and extensive new wall motion abnormalities (anteroseptal, anterior, inferior, apical hypokinesis).  There was a question of whether or not this was a stress-induced cardiomyopathy versus an ischemic cardiomyopathy.  A repeat echocardiogram prior to discharge did demonstrate an EF of 35-40% with anteroseptal, anterolateral and apical akinesis.  The patient did not want to undergo cardiac catheterization due to risk of nephrotoxicity.  She underwent kyphoplasty prior to discharge and apixaban was then resumed.  Beta-blocker therapy was titrated.  She was not placed on  ACE inhibitor or ARB secondary to advanced chronic kidney disease.  She was discharged to SNF.    Heather Benson returns for posthospitalization follow-up.  She is now back at home.  She is participating in home health physical therapy.  She is still using a walker and arrives in a wheelchair today.  She denies chest discomfort.  She does feel short of breath with some activities.  She denies orthopnea or paroxysmal nocturnal dyspnea.  Her lower extremity swelling is controlled by compression stockings.  She has chronic dizziness.  She denies syncope.  Prior CV studies:   The following studies were reviewed today:  Echo 01/31/2018 EF 35-40, apical anteroseptal, anterolateral and apical akinesis, grade 1 diastolic dysfunction, mild aortic stenosis (mean 11, peak 21), mild to moderate aortic insufficiency, mild MR, mild to moderate LAE, PASP 33  Echo 01/27/2018 EF 35-40, anteroseptal, anterior, inferior, apical hypokinesis consistent with ischemia in the distribution of the LAD, grade 1 diastolic dysfunction, mild aortic stenosis (mean 7, peak 13), mild aortic insufficiency, moderate MAC, mild to moderate MR, moderate to severe LAE, mild RAE, moderate TR, PASP 59  Echo 12/14/2015 Moderate concentric LVH, EF 55-60, very mild aortic stenosis, trivial AI, MAC, mild MR  24-hour Holter 02/2014 Intermittent atrial fibrillation with controlled ventricular rate   Echocardiogram 01/2014 Mild LVH, EF 50-55%, normal wall motion Mild AI Mild MR Mild LAE Mildly reduced RVSF Mild TR PASP 32 mmHg   Renal artery ultrasound 12/2011 No RAS   Cardiac cath 05/2010 LAD: Mid mild irregularities EF: 60%   Past Medical History:  Diagnosis Date  . Aortic atherosclerosis ([place]) 12/25/2016  .  Aortic stenosis, mild 12/14/2015  . Arthritis 08-28-11   right knee pain-surgery planned  . Cancer [facility]) 08-28-11   Melonoma-cheek(many Yrs ago) , recent bx. left  face lesion, past skin cancer lesions  . CAP (community acquired  pneumonia) 12/13/2015  . Chronic combined systolic and diastolic heart failure (HCC)    prob ischemic CM // admx with NSTEMI in 9/19 in setting of pneumonia // Echo 9/19: EF 35-40 // LHC avoided due to CKD //   . Complication of anesthesia 08-28-11   in past arrythmia-  cardiology has seen in past  . Dilated cardiomyopathy ([place]) 03/20/2018   admx with NSTEMI in setting of prob pneumonia in 9/19 >> prob ischemic CM // Echo 9/19:  EF 35-40, anteroseptal, anterior, inferior, apical hypokinesis consistent with ischemia in the distribution of the LAD, grade 1 diastolic dysfunction, mild aortic stenosis (mean 7, peak 13), mild aortic insufficiency, moderate MAC, mild to moderate MR, moderate to severe LAE, mild RAE, moderate TR, PASP 59 //   . Dysrhythmia 08-28-11   only immed.  to several days after surgery- hx. A.fib. in past  . GERD (gastroesophageal reflux disease) 08-28-11   tx. omeprazole  . Hypercholesterolemia   . Hypertension 08-28-11   tx. meds  . IBS (irritable bowel syndrome)   . Lymphedema   . Neuromuscular disorder ([place]) 08-28-11   hx. Polio age 41-slight residual affects legs  . Osteoarthritis    back and left knee  . Osteoporosis   . Pneumonia 12/2015   hospital x 3 days  . PONV (postoperative nausea and vomiting)   . Renal cyst, right   . Sarcoidosis   . Swelling of extremity 08-28-11   bilateral lower extremities- mid calf down   Surgical Hx: The patient  has a past surgical history that includes Cataract extraction, bilateral (08-28-11); Abdominal hysterectomy (08-28-11); Bunionectomy (08-28-11); Knee arthroscopy (09/06/2011); Joint replacement (Oct. 1, 2012); Eye surgery; Cardioversion (N/A, 08/05/2014); Breast reduction surgery (1997); Colonoscopy with propofol (N/A, 12/24/2016); IR VERTEBROPLASTY EA ADDL (T&LS) BX INC UNI/BIL INC INJECT/IMAGING (10/22/2017); IR VERTEBROPLASTY LUMBAR BX INC UNI/BIL INC INJECT/IMAGING (10/22/2017); and IR VERTEBROPLASTY LUMBAR BX INC UNI/BIL INC  INJECT/IMAGING (02/02/2018).   Current Medications: Current Meds  Medication Sig  . acetaminophen (TYLENOL) 500 MG tablet Take 1,000 mg by mouth every 8 (eight) hours as needed (for pain).   [name] amiodarone (PACERONE) 200 MG tablet Take 1 tablet (200 mg total) by mouth daily.  [name] apixaban (ELIQUIS) 2.5 MG TABS tablet Take 1 tablet (2.5 mg total) by mouth 2 (two) times daily. PLEASE START ON 02/03/18 AT 9PM  . atorvastatin (LIPITOR) 10 MG tablet Take 1 tablet (10 mg total) by mouth daily.  . furosemide (LASIX) 20 MG tablet Take 1 tablet (20 mg total) by mouth daily.  [name] latanoprost (XALATAN) 0.005 % ophthalmic solution Place 1 drop into both eyes at bedtime.  . mirtazapine (REMERON) 15 MG tablet Take 15 mg by mouth at bedtime.  . polyethylene glycol (MIRALAX / GLYCOLAX) packet Take 17 g by mouth daily as needed.  . potassium chloride (K-DUR) 10 MEQ tablet Take 10 mEq by mouth daily.  . predniSONE (DELTASONE) 10 MG tablet Take 10 mg by mouth daily with breakfast.  . Respiratory Therapy Supplies (FLUTTER) DEVI Use as directed.  . traMADol (ULTRAM) 50 MG tablet Take 1 tablet (50 mg total) by mouth every 6 (six) hours as needed.  . [DISCONTINUED] carvedilol (COREG) 3.125 MG tablet Take 1 tablet (3.125 mg total) by mouth 2 (two)  times daily with a meal.  . [DISCONTINUED] potassium chloride (K-DUR) 10 MEQ tablet Take one tablet by mouth daily on the days that you take Furosemide 60m. (Patient taking differently: Take 10 mEq by mouth daily. )     Allergies:   Augmentin [amoxicillin-pot clavulanate]; Boniva [ibandronic acid]; Codeine; Hydromorphone; Latex; Lisinopril; and Adhesive [tape]   Social History   Tobacco Use  . Smoking status: Never Smoker  . Smokeless tobacco: Never Used  Substance Use Topics  . Alcohol use: No    Alcohol/week: 0.0 standard drinks  . Drug use: No     Family Hx: The patient's family history includes Breast cancer in her sister; Colon cancer in her mother; Emphysema in  her father; Heart attack in her brother; Heart disease in her father and mother; Hyperlipidemia in her mother; Hypertension in her father and mother; Stroke in her maternal grandmother.  ROS:   Please see the history of present illness.    Review of Systems  Hematologic/Lymphatic: Bruises/bleeds easily.  Musculoskeletal: Positive for back pain.   All other systems reviewed and are negative.   EKGs/Labs/Other Test Reviewed:    EKG:  EKG is  ordered today.  The ekg ordered today demonstrates normal sinus rhythm, heart rate 79, left axis deviation, IVCD, QTC 460, similar to prior tracings  Recent Labs: 01/26/2018: B Natriuretic Peptide 384.8 01/28/2018: Magnesium 2.2 01/29/2018: ALT 30 01/30/2018: TSH 0.597 02/03/2018: BUN 23; Creatinine, Ser 1.63; Hemoglobin 12.8; Platelets 164; Potassium 3.8; Sodium 145   Recent Lipid Panel Lab Results  Component Value Date/Time   CHOL 211 (H) 11/20/2017 12:11 PM   TRIG 136 11/20/2017 12:11 PM   HDL 113 11/20/2017 12:11 PM   CHOLHDL 1.9 11/20/2017 12:11 PM   CHOLHDL 3.3 05/27/2010 05:05 AM   LDLCALC 71 11/20/2017 12:11 PM    Physical Exam:    VS:  BP 132/74   Pulse 78   Ht _0  (1.626 m)   Wt 153 lb (69.4 kg)   SpO2 94%   BMI 26.26 kg/m     Wt Readings from Last 3 Encounters:  03/20/18 153 lb (69.4 kg)  02/03/18 165 lb 12.6 oz (75.2 kg)  11/20/17 172 lb (78 kg)     Physical Exam  Constitutional: She is oriented to person, place, and time. She appears well-developed and well-nourished. No distress.  HENT:  Head: Normocephalic and atraumatic.  Eyes: No scleral icterus.  Neck: No thyromegaly present.  Cardiovascular: Normal rate and regular rhythm.  No murmur heard. Pulmonary/Chest: Effort normal. She has no rales.  Abdominal: Soft.  Musculoskeletal: She exhibits edema (trace ankle edema bilat; compression stockings in place).  Lymphadenopathy:    She has no cervical adenopathy.  Neurological: She is alert and oriented to person,  place, and time.  Skin: Skin is warm and dry.  Psychiatric: She has a normal mood and affect.    ASSESSMENT & PLAN:    Chronic combined systolic and diastolic heart failure (HCC) EF 35-40 with anterior wall motion abnormalities, grade 1 diastolic dysfunction.  Volume status appears stable.  She is on low-dose furosemide.  Functional status is difficult to assess given her chronic back pain from vertebral fracture.  She is not on ACE inhibitor or ARB secondary to chronic kidney disease.  Continue beta-blocker.  I will increase her carvedilol to 6.25 mg twice daily.  If her dizziness worsens, she can resume 3.125 mg twice daily.  At follow-up, we will try to initiate hydralazine/nitrates.  Consider follow-up echo in  3 months.  Dilated cardiomyopathy (HCC) Probable ischemic cardiomyopathy.  She is not on aspirin as she is on Apixaban.  Continue beta-blocker, statin.  She is not having any anginal symptoms.  Medical therapy has been planned given her chronic kidney disease.  I will give her a prescription for as needed nitroglycerin.  PAF (paroxysmal atrial fibrillation) (HCC) Maintaining normal sinus rhythm.  She remains on amiodarone, Apixaban.  Creatinine has been >1.5 and she is greater than 12 years old.  Continue apixaban 2.5 mg twice daily.  CKD (chronic kidney disease), stage IV ([place]) Continue follow-up with nephrology.  Recent creatinine stable.  On amiodarone therapy ALT and TSH were normal in September 2019.   Dispo:  Return in about 6 weeks (around 05/01/2018) for Routine Follow Up w/ Dr. Irish Lack.   Medication Adjustments/Labs and Tests Ordered: Current medicines are reviewed at length with the patient today.  Concerns regarding medicines are outlined above.  Tests Ordered: Orders Placed This Encounter  Procedures  . EKG 12-Lead   Medication Changes: Meds ordered this encounter  Medications  . nitroGLYCERIN (NITROSTAT) 0.4 MG SL tablet    Sig: Place 1 tablet (0.4 mg  total) under the tongue every 5 (five) minutes as needed for chest pain.    Dispense:  90 tablet    Refill:  3  . carvedilol (COREG) 6.25 MG tablet    Sig: Take 1 tablet (6.25 mg total) by mouth 2 (two) times daily.    Dispense:  180 tablet    Refill:  3    Signed, Richardson Dopp, PA-C  03/20/2018 12:57 PM    [address], [place], [place]  [address] Phone: [phone]; Fax: [phone]

## 2018-03-24 DIAGNOSIS — I130 Hypertensive heart and chronic kidney disease with heart failure and stage 1 through stage 4 chronic kidney disease, or unspecified chronic kidney disease: Secondary | ICD-10-CM | POA: Diagnosis not present

## 2018-03-24 DIAGNOSIS — J209 Acute bronchitis, unspecified: Secondary | ICD-10-CM | POA: Diagnosis not present

## 2018-03-24 DIAGNOSIS — S32020D Wedge compression fracture of second lumbar vertebra, subsequent encounter for fracture with routine healing: Secondary | ICD-10-CM | POA: Diagnosis not present

## 2018-03-24 DIAGNOSIS — Z4789 Encounter for other orthopedic aftercare: Secondary | ICD-10-CM | POA: Diagnosis not present

## 2018-03-24 DIAGNOSIS — I5042 Chronic combined systolic (congestive) and diastolic (congestive) heart failure: Secondary | ICD-10-CM | POA: Diagnosis not present

## 2018-03-24 DIAGNOSIS — S32030D Wedge compression fracture of third lumbar vertebra, subsequent encounter for fracture with routine healing: Secondary | ICD-10-CM | POA: Diagnosis not present

## 2018-03-27 DIAGNOSIS — I130 Hypertensive heart and chronic kidney disease with heart failure and stage 1 through stage 4 chronic kidney disease, or unspecified chronic kidney disease: Secondary | ICD-10-CM | POA: Diagnosis not present

## 2018-03-27 DIAGNOSIS — S32020D Wedge compression fracture of second lumbar vertebra, subsequent encounter for fracture with routine healing: Secondary | ICD-10-CM | POA: Diagnosis not present

## 2018-03-27 DIAGNOSIS — J209 Acute bronchitis, unspecified: Secondary | ICD-10-CM | POA: Diagnosis not present

## 2018-03-27 DIAGNOSIS — I5042 Chronic combined systolic (congestive) and diastolic (congestive) heart failure: Secondary | ICD-10-CM | POA: Diagnosis not present

## 2018-03-27 DIAGNOSIS — Z4789 Encounter for other orthopedic aftercare: Secondary | ICD-10-CM | POA: Diagnosis not present

## 2018-03-27 DIAGNOSIS — S32030D Wedge compression fracture of third lumbar vertebra, subsequent encounter for fracture with routine healing: Secondary | ICD-10-CM | POA: Diagnosis not present

## 2018-03-30 DIAGNOSIS — S32030D Wedge compression fracture of third lumbar vertebra, subsequent encounter for fracture with routine healing: Secondary | ICD-10-CM | POA: Diagnosis not present

## 2018-03-30 DIAGNOSIS — Z4789 Encounter for other orthopedic aftercare: Secondary | ICD-10-CM | POA: Diagnosis not present

## 2018-03-30 DIAGNOSIS — J209 Acute bronchitis, unspecified: Secondary | ICD-10-CM | POA: Diagnosis not present

## 2018-03-30 DIAGNOSIS — I5042 Chronic combined systolic (congestive) and diastolic (congestive) heart failure: Secondary | ICD-10-CM | POA: Diagnosis not present

## 2018-03-30 DIAGNOSIS — I130 Hypertensive heart and chronic kidney disease with heart failure and stage 1 through stage 4 chronic kidney disease, or unspecified chronic kidney disease: Secondary | ICD-10-CM | POA: Diagnosis not present

## 2018-03-30 DIAGNOSIS — S32020D Wedge compression fracture of second lumbar vertebra, subsequent encounter for fracture with routine healing: Secondary | ICD-10-CM | POA: Diagnosis not present

## 2018-04-02 DIAGNOSIS — I480 Paroxysmal atrial fibrillation: Secondary | ICD-10-CM | POA: Diagnosis not present

## 2018-04-02 DIAGNOSIS — I420 Dilated cardiomyopathy: Secondary | ICD-10-CM | POA: Diagnosis not present

## 2018-04-02 DIAGNOSIS — N184 Chronic kidney disease, stage 4 (severe): Secondary | ICD-10-CM | POA: Diagnosis not present

## 2018-04-02 DIAGNOSIS — I129 Hypertensive chronic kidney disease with stage 1 through stage 4 chronic kidney disease, or unspecified chronic kidney disease: Secondary | ICD-10-CM | POA: Diagnosis not present

## 2018-04-02 DIAGNOSIS — D869 Sarcoidosis, unspecified: Secondary | ICD-10-CM | POA: Diagnosis not present

## 2018-04-02 DIAGNOSIS — S32000A Wedge compression fracture of unspecified lumbar vertebra, initial encounter for closed fracture: Secondary | ICD-10-CM | POA: Diagnosis not present

## 2018-04-03 DIAGNOSIS — J209 Acute bronchitis, unspecified: Secondary | ICD-10-CM | POA: Diagnosis not present

## 2018-04-03 DIAGNOSIS — I5042 Chronic combined systolic (congestive) and diastolic (congestive) heart failure: Secondary | ICD-10-CM | POA: Diagnosis not present

## 2018-04-03 DIAGNOSIS — S32030D Wedge compression fracture of third lumbar vertebra, subsequent encounter for fracture with routine healing: Secondary | ICD-10-CM | POA: Diagnosis not present

## 2018-04-03 DIAGNOSIS — I130 Hypertensive heart and chronic kidney disease with heart failure and stage 1 through stage 4 chronic kidney disease, or unspecified chronic kidney disease: Secondary | ICD-10-CM | POA: Diagnosis not present

## 2018-04-03 DIAGNOSIS — Z4789 Encounter for other orthopedic aftercare: Secondary | ICD-10-CM | POA: Diagnosis not present

## 2018-04-03 DIAGNOSIS — S32020D Wedge compression fracture of second lumbar vertebra, subsequent encounter for fracture with routine healing: Secondary | ICD-10-CM | POA: Diagnosis not present

## 2018-04-06 DIAGNOSIS — I130 Hypertensive heart and chronic kidney disease with heart failure and stage 1 through stage 4 chronic kidney disease, or unspecified chronic kidney disease: Secondary | ICD-10-CM | POA: Diagnosis not present

## 2018-04-06 DIAGNOSIS — I5042 Chronic combined systolic (congestive) and diastolic (congestive) heart failure: Secondary | ICD-10-CM | POA: Diagnosis not present

## 2018-04-06 DIAGNOSIS — S32020D Wedge compression fracture of second lumbar vertebra, subsequent encounter for fracture with routine healing: Secondary | ICD-10-CM | POA: Diagnosis not present

## 2018-04-06 DIAGNOSIS — S32030D Wedge compression fracture of third lumbar vertebra, subsequent encounter for fracture with routine healing: Secondary | ICD-10-CM | POA: Diagnosis not present

## 2018-04-06 DIAGNOSIS — J209 Acute bronchitis, unspecified: Secondary | ICD-10-CM | POA: Diagnosis not present

## 2018-04-06 DIAGNOSIS — Z4789 Encounter for other orthopedic aftercare: Secondary | ICD-10-CM | POA: Diagnosis not present

## 2018-04-10 DIAGNOSIS — J209 Acute bronchitis, unspecified: Secondary | ICD-10-CM | POA: Diagnosis not present

## 2018-04-10 DIAGNOSIS — I5042 Chronic combined systolic (congestive) and diastolic (congestive) heart failure: Secondary | ICD-10-CM | POA: Diagnosis not present

## 2018-04-10 DIAGNOSIS — Z4789 Encounter for other orthopedic aftercare: Secondary | ICD-10-CM | POA: Diagnosis not present

## 2018-04-10 DIAGNOSIS — S32020D Wedge compression fracture of second lumbar vertebra, subsequent encounter for fracture with routine healing: Secondary | ICD-10-CM | POA: Diagnosis not present

## 2018-04-10 DIAGNOSIS — I130 Hypertensive heart and chronic kidney disease with heart failure and stage 1 through stage 4 chronic kidney disease, or unspecified chronic kidney disease: Secondary | ICD-10-CM | POA: Diagnosis not present

## 2018-04-10 DIAGNOSIS — S32030D Wedge compression fracture of third lumbar vertebra, subsequent encounter for fracture with routine healing: Secondary | ICD-10-CM | POA: Diagnosis not present

## 2018-04-13 ENCOUNTER — Encounter: Payer: Self-pay | Admitting: Physician Assistant

## 2018-04-13 DIAGNOSIS — Z4789 Encounter for other orthopedic aftercare: Secondary | ICD-10-CM | POA: Diagnosis not present

## 2018-04-13 DIAGNOSIS — I130 Hypertensive heart and chronic kidney disease with heart failure and stage 1 through stage 4 chronic kidney disease, or unspecified chronic kidney disease: Secondary | ICD-10-CM | POA: Diagnosis not present

## 2018-04-13 DIAGNOSIS — S32030D Wedge compression fracture of third lumbar vertebra, subsequent encounter for fracture with routine healing: Secondary | ICD-10-CM | POA: Diagnosis not present

## 2018-04-13 DIAGNOSIS — S32020D Wedge compression fracture of second lumbar vertebra, subsequent encounter for fracture with routine healing: Secondary | ICD-10-CM | POA: Diagnosis not present

## 2018-04-13 DIAGNOSIS — I5042 Chronic combined systolic (congestive) and diastolic (congestive) heart failure: Secondary | ICD-10-CM | POA: Diagnosis not present

## 2018-04-13 DIAGNOSIS — J209 Acute bronchitis, unspecified: Secondary | ICD-10-CM | POA: Diagnosis not present

## 2018-04-13 NOTE — Progress Notes (Signed)
Cardiology Office Note    Date:  04/14/2018  ID:  Heather Benson, DOB 03-17-1932, MRN [id number] PCP:  [name], MD  Cardiologist:  Larae Grooms, MD  Nephrologist: Dr. Lorrene Reid   Chief Complaint: f/u LV dysfunction  History of Present Illness:  Heather Benson is a 84 y.o. female with history of paroxysmal atrial fibrillation s/p cardioversion in the past (on amidarone), recently diagnosed LV dysfunction EF 35-40%, previous chronic diastolic CHF, mild CAD by cath 2012, CKD stage IV, hypertension, acid reflux, chronic steroid use for ?cough/sarcoidosis, aortic atheroscleorsis, SVT, diverticular GIB 12/2016, vertebral compression fracture status post prior kyphoplasty who presents for f/u.  In 01/2018 she was admitted with weaknessand sepsis and found to have an elevated troponin level(peak 0.91),with echo showing an extensive new wall motion abnormality and moderately depressed left ventricular systolic function.She was initially felt to have syncope but notes clarified this was instead leg weakness. She was felt to have suspected PNA. Initial echocardiogram demonstrated reduced LV function with an EF of 35-40% and extensive new wall motion abnormalities (anteroseptal, anterior, inferior, apical hypokinesis). There was a question of whether or not this was a stress-induced cardiomyopathy versus an ischemic cardiomyopathy. A repeat echocardiogram prior to discharge did demonstrate an EF of 35-40% with anteroseptal, anterolateral and apical akinesis. Limited echo also showed grade 1 Dd, mild AS, mild-mod AI, mild MR, mild-mod LAE, mildly elevated PASP 49mHg. The patient did not want to undergo cardiac catheterization due to risk of nephrotoxicity.  She underwent kyphoplasty prior to discharge and apixaban was then resumed. Carvedilol was initiated. She was not placed on ACE inhibitor or ARB secondary to advanced chronic kidney disease. She was discharged to SNF. Last labs otherwise  01/2018 showed Cr 1.63, Hgb 12.8, plt 164, TSH wnl, normal AST/ALT, decreased albumin. In 03/2018 she saw nephrology with BUN 28, Cr 1.76, Na 142, K 4.1.  She returns for follow-up overall stable except her dizziness seemed to worsen about a month ago. That was around the time carvedilol was increased.  It is somewhat worse with position changes and in the morning. She denies any CP, SOB, palpitations, syncope or bleeding. She is working with PT in her home and it is going well, but mobility is overall still limited. She is accompanied by her son today.   Past Medical History:  Diagnosis Date  . Aortic atherosclerosis ([place] 12/25/2016  . Aortic regurgitation    a. mild-mod by echo 01/2018.  .Marland KitchenAortic stenosis, mild 12/14/2015  . Arthritis 08-28-11   right knee pain-surgery planned  . CAD (coronary artery disease)    a. by cath 2012. b. Elev troponin 01/2018 -> cath not pursued due to renal dysfunction.  . Cancer ([facility] 08-28-11   Melonoma-cheek(many Yrs ago) , recent bx. left  face lesion, past skin cancer lesions  . CAP (community acquired pneumonia) 12/13/2015  . Chronic combined systolic [place] diastolic heart [place]    prob ischemic CM // admx with NSTEMI in 9/19 in setting of pneumonia // Echo 9/19: EF 35-40 // LHC avoided due to CKD //   . Complication of anesthesia 08-28-11   in past arrythmia-  cardiology has seen in past  . [place] 03/20/2018   admx with NSTEMI in setting of prob pneumonia in 9/19 >> prob ischemic CM // Echo 9/19:  EF 35-40, anteroseptal, anterior, inferior, apical hypokinesis consistent with ischemia in the distribution of the LAD, grade 1 diastolic dysfunction, mild aortic stenosis (mean 7, peak 13), mild  aortic insufficiency, moderate MAC, mild to moderate MR, moderate to severe LAE, mild RAE, moderate TR, PASP 59 //   . GERD (gastroesophageal reflux disease) 08-28-11   tx. omeprazole  . Hypercholesterolemia   . Hypertension 08-28-11   tx. meds  .  IBS (irritable bowel syndrome)   . Lymphedema   . Mild mitral regurgitation   . Neuromuscular disorder ([place]) 08-28-11   hx. Polio age 23-slight residual affects legs  . Osteoarthritis    back and left knee  . Osteoporosis   . PAF (paroxysmal atrial fibrillation) ([place])   . Pneumonia 12/2015   hospital x 3 days  . PONV (postoperative nausea and vomiting)   . Renal cyst, right   . Sarcoidosis   . SVT (supraventricular tachycardia) ([place])   . Swelling of extremity 08-28-11   bilateral lower extremities- mid calf down    Past Surgical History:  Procedure Laterality Date  . ABDOMINAL HYSTERECTOMY  08-28-11   Partial-vaginal approach  . BREAST REDUCTION SURGERY  1997  . BUNIONECTOMY  08-28-11   right  . CARDIOVERSION N/A 08/05/2014   Procedure: CARDIOVERSION;  Surgeon: Larey Dresser, MD;  Location: [facility];  Service: Cardiovascular;  Laterality: N/A;  . CATARACT EXTRACTION, BILATERAL  08-28-11   bilateral  . COLONOSCOPY WITH PROPOFOL N/A 12/24/2016   Procedure: COLONOSCOPY WITH PROPOFOL;  Surgeon: Wonda Horner, MD;  Location: [facility] ENDOSCOPY;  Service: Endoscopy;  Laterality: N/A;  . EYE SURGERY     Cataract  . IR VERTEBROPLASTY EA ADDL (T&LS) BX INC UNI/BIL INC INJECT/IMAGING  10/22/2017  . IR VERTEBROPLASTY LUMBAR BX INC UNI/BIL INC/INJECT/IMAGING  10/22/2017  . IR VERTEBROPLASTY LUMBAR BX INC UNI/BIL INC/INJECT/IMAGING  02/02/2018  . JOINT REPLACEMENT  Oct. 1, 2012   Right Knee  . KNEE ARTHROSCOPY  09/06/2011   Procedure: ARTHROSCOPY KNEE;  Surgeon: Gearlean Alf, MD;  Location: [facility];  Service: Orthopedics;  Laterality: Right;  with Synovectomy    Current Medications: Current Meds  Medication Sig  . acetaminophen (TYLENOL) 500 MG tablet Take 1,000 mg by mouth every 8 (eight) hours as needed (for pain).   [name] amiodarone (PACERONE) 200 MG tablet Take 1 tablet (200 mg total) by mouth daily.  [name] apixaban (ELIQUIS) 2.5 MG TABS tablet Take 1 tablet (2.5 mg total) by mouth 2 (two)  times daily. PLEASE START ON 02/03/18 AT 9PM  . atorvastatin (LIPITOR) 10 MG tablet Take 1 tablet (10 mg total) by mouth daily.  . carvedilol (COREG) 6.25 MG tablet Take 1 tablet (6.25 mg total) by mouth 2 (two) times daily.  . furosemide (LASIX) 20 MG tablet Take 1 tablet (20 mg total) by mouth daily.  [name] latanoprost (XALATAN) 0.005 % ophthalmic solution Place 1 drop into both eyes at bedtime.  . mirtazapine (REMERON) 15 MG tablet Take 15 mg by mouth at bedtime.  . nitroGLYCERIN (NITROSTAT) 0.4 MG SL tablet Place 1 tablet (0.4 mg total) under the tongue every 5 (five) minutes as needed for chest pain.  . polyethylene glycol (MIRALAX / GLYCOLAX) packet Take 17 g by mouth daily as needed.  . potassium chloride (K-DUR) 10 MEQ tablet Take 10 mEq by mouth daily.  . predniSONE (DELTASONE) 10 MG tablet Take 10 mg by mouth daily with breakfast.  . Respiratory Therapy Supplies (FLUTTER) DEVI Use as directed.  . traMADol (ULTRAM) 50 MG tablet Take 1 tablet (50 mg total) by mouth every 6 (six) hours as needed.      Allergies:   Augmentin [amoxicillin-pot  clavulanate]; Boniva [ibandronic acid]; Codeine; Hydromorphone; Latex; Lisinopril; and Adhesive [tape]   Social History   Socioeconomic History  . Marital status: Married    Spouse name: [name]  . Number of children: 3  . Years of education: 63  . Highest education level: Not on file  Occupational History  . Occupation: Retired- [employer]  . Financial resource strain: Not on file  . Food insecurity:    Worry: Not on file    Inability: Not on file  . Transportation needs:    Medical: Not on file    Non-medical: Not on file  Tobacco Use  . Smoking status: Never Smoker  . Smokeless tobacco: Never Used  Substance and Sexual Activity  . Alcohol use: No    Alcohol/week: 0.0 standard drinks  . Drug use: No  . Sexual activity: Not Currently    Partners: Male  Lifestyle  . Physical activity:    Days per week: Not on file     Minutes per session: Not on file  . Stress: Not on file  Relationships  . Social connections:    Talks on phone: Not on file    Gets together: Not on file    Attends religious service: Not on file    Active member of club or organization: Not on file    Attends meetings of clubs or organizations: Not on file    Relationship status: Not on file  Other Topics Concern  . Not on file  Social History Narrative   Lives with husband, Hilbert Odor at home   Caffeine use: Drinks coffee/tea/caffeine free soda   OCCUPATION: retired      Family History:  The patient's family history includes Breast cancer in her sister; Colon cancer in her mother; Emphysema in her father; Heart attack in her brother; Heart disease in her father and mother; Hyperlipidemia in her mother; Hypertension in her father and mother; Stroke in her maternal grandmother.  ROS:   Please see the history of present illness.  All other systems are reviewed and otherwise negative.    PHYSICAL EXAM:   VS:  BP 130/70   Pulse (!) 56   Ht _0  (1.626 m)   Wt 157 lb 1.9 oz (71.3 kg)   SpO2 93%   BMI 26.97 kg/m   BMI: Body mass index is 26.97 kg/m. GEN: Well nourished, well developed WF, in no acute distress HEENT: normocephalic, atraumatic Neck: no JVD, carotid bruits, or masses Cardiac: reg rhythm, bradycardic; no murmurs, rubs, or gallops, trace ankle edema with compression stockings in place  Respiratory:  clear to auscultation bilaterally, normal work of breathing GI: soft, nontender, nondistended, + BS MS: no deformity or atrophy Skin: warm and dry, no rash Neuro:  Alert and Oriented x 3, Strength and sensation are intact, follows commands Psych: euthymic mood, full affect  Wt Readings from Last 3 Encounters:  04/14/18 157 lb 1.9 oz (71.3 kg)  03/20/18 153 lb (69.4 kg)  02/03/18 165 lb 12.6 oz (75.2 kg)      Studies/Labs Reviewed:   EKG:  EKG was ordered today and personally reviewed by me and demonstrates sinus  bradycardia 53bpm, LVH with repol abnormality, baseline artifact due to patient motion  Recent Labs: 01/26/2018: B Natriuretic Peptide 384.8 01/28/2018: Magnesium 2.2 01/29/2018: ALT 30 01/30/2018: TSH 0.597 02/03/2018: BUN 23; Creatinine, Ser 1.63; Hemoglobin 12.8; Platelets 164; Potassium 3.8; Sodium 145   Lipid Panel    Component Value Date/Time  CHOL 211 (H) 11/20/2017 1211   TRIG 136 11/20/2017 1211   HDL 113 11/20/2017 1211   CHOLHDL 1.9 11/20/2017 1211   CHOLHDL 3.3 05/27/2010 0505   VLDL 19 05/27/2010 0505   LDLCALC 71 11/20/2017 1211    Additional studies/ records that were reviewed today include: Summarized above.   ASSESSMENT & PLAN:   1. Chronic combined CHF - weight fluctuating but overall remains lower than 01/2018. Her diuretics have been managed by nephrology in context of CKD. She feels stable from this perspective. She feels edema is improved from baseline. No CP or SOB. She does complain of worsening dizziness on carvedilol. Will decrease dose back to 3.192m BID. I would like to see if this improves her dizziness before advancing her med regimen further. We will see her back in a few weeks to clarify where her blood pressure and HR settle. She may have met the maximum amount of medication she is able to clinically tolerate. At 03/20/18 OV it was suggested to consider f/u echo in 3 months. Will plan for echo in 06/2018 to reassess LV function.  2. History of elevated troponin - managed conservatively. Continue ASA, BB, statin. 3. Paroxysmal atrial fibrillation - maintaining NSR. 4. CKD IV - followed by nephrology. 5. Dizziness - see above. Will also check blood count to ensure stable given apixaban.  Disposition: F/u with Dr. VRoney Marionteam APP in 4 weeks.   Medication Adjustments/Labs and Tests Ordered: Current medicines are reviewed at length with the patient today.  Concerns regarding medicines are outlined above. Medication changes, Labs and Tests ordered  today are summarized above and listed in the Patient Instructions accessible in Encounters.   Signed, DCharlie Pitter PA-C  04/14/2018 10:50 AM    [facility] [address] [place]  [id number]: [phone] Fax: [phone]

## 2018-04-14 ENCOUNTER — Encounter: Payer: Self-pay | Admitting: Physician Assistant

## 2018-04-14 ENCOUNTER — Ambulatory Visit (INDEPENDENT_AMBULATORY_CARE_PROVIDER_SITE_OTHER): Payer: Medicare Other | Admitting: Physician Assistant

## 2018-04-14 VITALS — BP 130/70 | HR 56 | Ht 64.0 in | Wt 157.1 lb

## 2018-04-14 DIAGNOSIS — R7989 Other specified abnormal findings of blood chemistry: Secondary | ICD-10-CM

## 2018-04-14 DIAGNOSIS — I480 Paroxysmal atrial fibrillation: Secondary | ICD-10-CM | POA: Diagnosis not present

## 2018-04-14 DIAGNOSIS — R42 Dizziness and giddiness: Secondary | ICD-10-CM | POA: Diagnosis not present

## 2018-04-14 DIAGNOSIS — I5042 Chronic combined systolic (congestive) and diastolic (congestive) heart failure: Secondary | ICD-10-CM | POA: Diagnosis not present

## 2018-04-14 DIAGNOSIS — N184 Chronic kidney disease, stage 4 (severe): Secondary | ICD-10-CM

## 2018-04-14 DIAGNOSIS — R778 Other specified abnormalities of plasma proteins: Secondary | ICD-10-CM

## 2018-04-14 LAB — CBC
HEMOGLOBIN: 14 g/dL (ref 11.1–15.9)
Hematocrit: 42.4 % (ref 34.0–46.6)
MCH: 30.1 pg (ref 26.6–33.0)
MCHC: 33 g/dL (ref 31.5–35.7)
MCV: 91 fL (ref 79–97)
PLATELETS: 207 10*3/uL (ref 150–450)
RBC: 4.65 x10E6/uL (ref 3.77–5.28)
RDW: 15.8 % — ABNORMAL HIGH (ref 12.3–15.4)
WBC: 12.5 10*3/uL — AB (ref 3.4–10.8)

## 2018-04-14 MED ORDER — CARVEDILOL 3.125 MG PO TABS: 3.1250 mg | ORAL_TABLET | Freq: Two times a day (BID) | ORAL | 3 refills | Status: DC

## 2018-04-14 NOTE — Patient Instructions (Addendum)
Medication Instructions:  Your physician has recommended you make the following change in your medication:  1.  REDUCE the Carvedilol to 3.125 mg taking 1 tablet twice a day.  This is has been sent to your local [facility].  If you need a refill on your cardiac medications before your next appointment, please call your pharmacy.   Lab work: None ordered  If you have labs (blood work) drawn today and your tests are completely normal, you will receive your results only by: [name] MyChart Message (if you have MyChart) OR . A paper copy in the mail If you have any lab test that is abnormal or we need to change your treatment, we will call you to review the results.  Testing/Procedures: Your physician has requested that you have an echocardiogram IN February, 2020. Echocardiography is a painless test that uses sound waves to create images of your heart. It provides your doctor with information about the size and shape of your heart and how well your heart's chambers and valves are working. This procedure takes approximately one hour. There are no restrictions for this procedure.    Follow-Up: Your physician recommends that you schedule a follow-up appointment in: [place] DR. VARANASI   Any Other Special Instructions Will Be Listed Below (If Applicable).  Call in about 1 week with an update regarding pts dizziness. Echocardiogram An echocardiogram, or echocardiography, uses sound waves (ultrasound) to produce an image of your heart. The echocardiogram is simple, painless, obtained within a short period of time, and offers valuable information to your health care provider. The images from an echocardiogram can provide information such as:  Evidence of coronary artery disease (CAD).  Heart size.  Heart muscle function.  Heart valve function.  Aneurysm detection.  Evidence of a past heart attack.  Fluid buildup around the heart.  Heart muscle thickening.  Assess heart valve  function.  Tell a health care provider about:  Any allergies you have.  All medicines you are taking, including vitamins, herbs, eye drops, creams, and over-the-counter medicines.  Any problems you or family members have had with anesthetic medicines.  Any blood disorders you have.  Any surgeries you have had.  Any medical conditions you have.  Whether you are pregnant or may be pregnant. What happens before the procedure? No special preparation is needed. Eat and drink normally. What happens during the procedure?  In order to produce an image of your heart, gel will be applied to your chest and a wand-like tool (transducer) will be moved over your chest. The gel will help transmit the sound waves from the transducer. The sound waves will harmlessly bounce off your heart to allow the heart images to be captured in real-time motion. These images will then be recorded.  You may need an IV to receive a medicine that improves the quality of the pictures. What happens after the procedure? You may return to your normal schedule including diet, activities, and medicines, unless your health care provider tells you otherwise. This information is not intended to replace advice given to you by your health care provider. Make sure you discuss any questions you have with your health care provider. Document Released: 04/26/2000 Document Revised: 12/16/2015 Document Reviewed: 01/04/2013 Elsevier Interactive Patient Education  2017 [employer].

## 2018-04-16 DIAGNOSIS — I130 Hypertensive heart and chronic kidney disease with heart failure and stage 1 through stage 4 chronic kidney disease, or unspecified chronic kidney disease: Secondary | ICD-10-CM | POA: Diagnosis not present

## 2018-04-16 DIAGNOSIS — I5042 Chronic combined systolic (congestive) and diastolic (congestive) heart failure: Secondary | ICD-10-CM | POA: Diagnosis not present

## 2018-04-16 DIAGNOSIS — S32020D Wedge compression fracture of second lumbar vertebra, subsequent encounter for fracture with routine healing: Secondary | ICD-10-CM | POA: Diagnosis not present

## 2018-04-16 DIAGNOSIS — J209 Acute bronchitis, unspecified: Secondary | ICD-10-CM | POA: Diagnosis not present

## 2018-04-16 DIAGNOSIS — Z4789 Encounter for other orthopedic aftercare: Secondary | ICD-10-CM | POA: Diagnosis not present

## 2018-04-16 DIAGNOSIS — S32030D Wedge compression fracture of third lumbar vertebra, subsequent encounter for fracture with routine healing: Secondary | ICD-10-CM | POA: Diagnosis not present

## 2018-04-21 DIAGNOSIS — S32030D Wedge compression fracture of third lumbar vertebra, subsequent encounter for fracture with routine healing: Secondary | ICD-10-CM | POA: Diagnosis not present

## 2018-04-21 DIAGNOSIS — S32020D Wedge compression fracture of second lumbar vertebra, subsequent encounter for fracture with routine healing: Secondary | ICD-10-CM | POA: Diagnosis not present

## 2018-04-21 DIAGNOSIS — Z4789 Encounter for other orthopedic aftercare: Secondary | ICD-10-CM | POA: Diagnosis not present

## 2018-04-21 DIAGNOSIS — I5042 Chronic combined systolic (congestive) and diastolic (congestive) heart failure: Secondary | ICD-10-CM | POA: Diagnosis not present

## 2018-04-21 DIAGNOSIS — I130 Hypertensive heart and chronic kidney disease with heart failure and stage 1 through stage 4 chronic kidney disease, or unspecified chronic kidney disease: Secondary | ICD-10-CM | POA: Diagnosis not present

## 2018-04-21 DIAGNOSIS — J209 Acute bronchitis, unspecified: Secondary | ICD-10-CM | POA: Diagnosis not present

## 2018-04-23 DIAGNOSIS — Z4789 Encounter for other orthopedic aftercare: Secondary | ICD-10-CM | POA: Diagnosis not present

## 2018-04-23 DIAGNOSIS — S32020D Wedge compression fracture of second lumbar vertebra, subsequent encounter for fracture with routine healing: Secondary | ICD-10-CM | POA: Diagnosis not present

## 2018-04-23 DIAGNOSIS — S32030D Wedge compression fracture of third lumbar vertebra, subsequent encounter for fracture with routine healing: Secondary | ICD-10-CM | POA: Diagnosis not present

## 2018-04-23 DIAGNOSIS — I130 Hypertensive heart and chronic kidney disease with heart failure and stage 1 through stage 4 chronic kidney disease, or unspecified chronic kidney disease: Secondary | ICD-10-CM | POA: Diagnosis not present

## 2018-04-23 DIAGNOSIS — I5042 Chronic combined systolic (congestive) and diastolic (congestive) heart failure: Secondary | ICD-10-CM | POA: Diagnosis not present

## 2018-04-23 DIAGNOSIS — J209 Acute bronchitis, unspecified: Secondary | ICD-10-CM | POA: Diagnosis not present

## 2018-04-28 DIAGNOSIS — Z7901 Long term (current) use of anticoagulants: Secondary | ICD-10-CM | POA: Diagnosis not present

## 2018-04-28 DIAGNOSIS — H409 Unspecified glaucoma: Secondary | ICD-10-CM | POA: Diagnosis not present

## 2018-04-28 DIAGNOSIS — E440 Moderate protein-calorie malnutrition: Secondary | ICD-10-CM | POA: Diagnosis not present

## 2018-04-28 DIAGNOSIS — N184 Chronic kidney disease, stage 4 (severe): Secondary | ICD-10-CM | POA: Diagnosis not present

## 2018-04-28 DIAGNOSIS — I130 Hypertensive heart and chronic kidney disease with heart failure and stage 1 through stage 4 chronic kidney disease, or unspecified chronic kidney disease: Secondary | ICD-10-CM | POA: Diagnosis not present

## 2018-04-28 DIAGNOSIS — Z9981 Dependence on supplemental oxygen: Secondary | ICD-10-CM | POA: Diagnosis not present

## 2018-04-28 DIAGNOSIS — I5042 Chronic combined systolic (congestive) and diastolic (congestive) heart failure: Secondary | ICD-10-CM | POA: Diagnosis not present

## 2018-04-28 DIAGNOSIS — Z96651 Presence of right artificial knee joint: Secondary | ICD-10-CM | POA: Diagnosis not present

## 2018-04-28 DIAGNOSIS — J209 Acute bronchitis, unspecified: Secondary | ICD-10-CM | POA: Diagnosis not present

## 2018-04-28 DIAGNOSIS — S32020D Wedge compression fracture of second lumbar vertebra, subsequent encounter for fracture with routine healing: Secondary | ICD-10-CM | POA: Diagnosis not present

## 2018-04-28 DIAGNOSIS — Z9181 History of falling: Secondary | ICD-10-CM | POA: Diagnosis not present

## 2018-04-28 DIAGNOSIS — Z7952 Long term (current) use of systemic steroids: Secondary | ICD-10-CM | POA: Diagnosis not present

## 2018-04-28 DIAGNOSIS — M810 Age-related osteoporosis without current pathological fracture: Secondary | ICD-10-CM | POA: Diagnosis not present

## 2018-04-28 DIAGNOSIS — I480 Paroxysmal atrial fibrillation: Secondary | ICD-10-CM | POA: Diagnosis not present

## 2018-04-28 DIAGNOSIS — S32030D Wedge compression fracture of third lumbar vertebra, subsequent encounter for fracture with routine healing: Secondary | ICD-10-CM | POA: Diagnosis not present

## 2018-04-28 DIAGNOSIS — M8580 Other specified disorders of bone density and structure, unspecified site: Secondary | ICD-10-CM | POA: Diagnosis not present

## 2018-04-28 DIAGNOSIS — M5135 Other intervertebral disc degeneration, thoracolumbar region: Secondary | ICD-10-CM | POA: Diagnosis not present

## 2018-04-30 DIAGNOSIS — S32020D Wedge compression fracture of second lumbar vertebra, subsequent encounter for fracture with routine healing: Secondary | ICD-10-CM | POA: Diagnosis not present

## 2018-04-30 DIAGNOSIS — I5042 Chronic combined systolic (congestive) and diastolic (congestive) heart failure: Secondary | ICD-10-CM | POA: Diagnosis not present

## 2018-04-30 DIAGNOSIS — N184 Chronic kidney disease, stage 4 (severe): Secondary | ICD-10-CM | POA: Diagnosis not present

## 2018-04-30 DIAGNOSIS — I130 Hypertensive heart and chronic kidney disease with heart failure and stage 1 through stage 4 chronic kidney disease, or unspecified chronic kidney disease: Secondary | ICD-10-CM | POA: Diagnosis not present

## 2018-04-30 DIAGNOSIS — J209 Acute bronchitis, unspecified: Secondary | ICD-10-CM | POA: Diagnosis not present

## 2018-04-30 DIAGNOSIS — S32030D Wedge compression fracture of third lumbar vertebra, subsequent encounter for fracture with routine healing: Secondary | ICD-10-CM | POA: Diagnosis not present

## 2018-05-04 DIAGNOSIS — I130 Hypertensive heart and chronic kidney disease with heart failure and stage 1 through stage 4 chronic kidney disease, or unspecified chronic kidney disease: Secondary | ICD-10-CM | POA: Diagnosis not present

## 2018-05-04 DIAGNOSIS — S32020D Wedge compression fracture of second lumbar vertebra, subsequent encounter for fracture with routine healing: Secondary | ICD-10-CM | POA: Diagnosis not present

## 2018-05-04 DIAGNOSIS — N184 Chronic kidney disease, stage 4 (severe): Secondary | ICD-10-CM | POA: Diagnosis not present

## 2018-05-04 DIAGNOSIS — J209 Acute bronchitis, unspecified: Secondary | ICD-10-CM | POA: Diagnosis not present

## 2018-05-04 DIAGNOSIS — I5042 Chronic combined systolic (congestive) and diastolic (congestive) heart failure: Secondary | ICD-10-CM | POA: Diagnosis not present

## 2018-05-04 DIAGNOSIS — S32030D Wedge compression fracture of third lumbar vertebra, subsequent encounter for fracture with routine healing: Secondary | ICD-10-CM | POA: Diagnosis not present

## 2018-05-07 DIAGNOSIS — N184 Chronic kidney disease, stage 4 (severe): Secondary | ICD-10-CM | POA: Diagnosis not present

## 2018-05-07 DIAGNOSIS — S32030D Wedge compression fracture of third lumbar vertebra, subsequent encounter for fracture with routine healing: Secondary | ICD-10-CM | POA: Diagnosis not present

## 2018-05-07 DIAGNOSIS — I5042 Chronic combined systolic (congestive) and diastolic (congestive) heart failure: Secondary | ICD-10-CM | POA: Diagnosis not present

## 2018-05-07 DIAGNOSIS — J209 Acute bronchitis, unspecified: Secondary | ICD-10-CM | POA: Diagnosis not present

## 2018-05-07 DIAGNOSIS — S32020D Wedge compression fracture of second lumbar vertebra, subsequent encounter for fracture with routine healing: Secondary | ICD-10-CM | POA: Diagnosis not present

## 2018-05-07 DIAGNOSIS — I130 Hypertensive heart and chronic kidney disease with heart failure and stage 1 through stage 4 chronic kidney disease, or unspecified chronic kidney disease: Secondary | ICD-10-CM | POA: Diagnosis not present

## 2018-05-11 DIAGNOSIS — J209 Acute bronchitis, unspecified: Secondary | ICD-10-CM | POA: Diagnosis not present

## 2018-05-11 DIAGNOSIS — N184 Chronic kidney disease, stage 4 (severe): Secondary | ICD-10-CM | POA: Diagnosis not present

## 2018-05-11 DIAGNOSIS — S32030D Wedge compression fracture of third lumbar vertebra, subsequent encounter for fracture with routine healing: Secondary | ICD-10-CM | POA: Diagnosis not present

## 2018-05-11 DIAGNOSIS — S32020D Wedge compression fracture of second lumbar vertebra, subsequent encounter for fracture with routine healing: Secondary | ICD-10-CM | POA: Diagnosis not present

## 2018-05-11 DIAGNOSIS — I130 Hypertensive heart and chronic kidney disease with heart failure and stage 1 through stage 4 chronic kidney disease, or unspecified chronic kidney disease: Secondary | ICD-10-CM | POA: Diagnosis not present

## 2018-05-11 DIAGNOSIS — I5042 Chronic combined systolic (congestive) and diastolic (congestive) heart failure: Secondary | ICD-10-CM | POA: Diagnosis not present

## 2018-05-11 NOTE — Progress Notes (Deleted)
Cardiology Office Note    Date:  05/11/2018  ID:  Heather Benson, DOB 1932-04-18, MRN [id number] PCP:  [name], MD  Cardiologist:  Larae Grooms, MD   Chief Complaint: f/u CHF  History of Present Illness:  Heather Benson is a 84 y.o. female with history of paroxysmal atrial fibrillation s/p cardioversion in the past (on amiodarone), cardiomyopathy (EF 35-40% in 01/2018) with chronic combined CHF (previously diastolic), mild CAD by cath 2012, CKD stage IV, hypertension, acid reflux, chronic steroid use for ?cough/sarcoidosis, aortic atheroscleorsis, SVT, diverticular GIB 12/2016, vertebral compression fracture status post prior kyphoplasty who presents for f/u.  In 01/2018 she was admitted with weaknessand sepsis and found to have an elevated troponin level(peak 0.91),with echo showing an extensive new wall motion abnormality and moderately depressed left ventricular systolic function.She was initially felt to have syncope but notes clarified this was instead leg weakness. She was felt to have suspected PNA. Initial echocardiogram demonstrated reduced LV function with an EF of 35-40% and extensive new wall motion abnormalities (anteroseptal, anterior, inferior, apical hypokinesis). There was a question of whether or not this was a stress-induced cardiomyopathy versus an ischemic cardiomyopathy. A repeat echocardiogram prior to discharge did demonstrate an EF of 35-40% with anteroseptal, anterolateral and apical akinesis. Limited echo also showed grade 1 Dd, mild AS, mild-mod AI, mild MR, mild-mod LAE, mildly elevated PASP 16mHg. The patient did not want to undergo cardiac catheterization due to risk of nephrotoxicity.  She underwent kyphoplasty prior to discharge and apixaban was then resumed. Carvedilol was initiated. She was not placed on ACE inhibitor or ARB secondary to advanced chronic kidney disease. She was discharged to SNF then was released back home. She saw SRichardson Dopp 03/20/18 and carvedilol was increased but she did not tolerate this due to worsened dizziness. There has also been a vertiginous component of this. Last labs by nephrology 03/2018 BUN 28, Cr 1.76, Na 142, K 4.1, CBC 04/2018 showed Hgb 14, Plt 207.  Echo planned 06/2018  Chronic combined CHF History of elevated troponin Paroxysmal atrial fibrillation  CKD IV   Past Medical History:  Diagnosis Date  . Aortic atherosclerosis ([facility] 12/25/2016  . Aortic regurgitation    a. mild-mod by echo 01/2018.  .Marland KitchenAortic stenosis, mild 12/14/2015  . Arthritis 08-28-11   right knee pain-surgery planned  . CAD (coronary artery disease)    a. by cath 2012. b. Elev troponin 01/2018 -> cath not pursued due to renal dysfunction.  . Cancer ([facility] 08-28-11   Melonoma-cheek(many Yrs ago) , recent bx. left  face lesion, past skin cancer lesions  . CAP (community acquired pneumonia) 12/13/2015  . Chronic combined systolic and diastolic heart failure (HCC)    prob ischemic CM // admx with NSTEMI in 9/19 in setting of pneumonia // Echo 9/19: EF 35-40 // LHC avoided due to CKD //   . Complication of anesthesia 08-28-11   in past arrythmia-  cardiology has seen in past  . Dilated cardiomyopathy ([facility] 03/20/2018   admx with NSTEMI in setting of prob pneumonia in 9/19 >> prob ischemic CM // Echo 9/19:  EF 35-40, anteroseptal, anterior, inferior, apical hypokinesis consistent with ischemia in the distribution of the LAD, grade 1 diastolic dysfunction, mild aortic stenosis (mean 7, peak 13), mild aortic insufficiency, moderate MAC, mild to moderate MR, moderate to severe LAE, mild RAE, moderate TR, PASP 59 //   . GERD (gastroesophageal reflux disease) 08-28-11   tx. omeprazole  . Hypercholesterolemia   .  Hypertension 08-28-11   tx. meds  . IBS (irritable bowel syndrome)   . Lymphedema   . Mild mitral regurgitation   . Neuromuscular disorder ([place]) 08-28-11   hx. Polio age 66-slight residual affects legs  . Osteoarthritis    back  and left knee  . Osteoporosis   . PAF (paroxysmal atrial fibrillation) ([place])   . Pneumonia 12/2015   hospital x 3 days  . PONV (postoperative nausea and vomiting)   . Renal cyst, right   . Sarcoidosis   . SVT (supraventricular tachycardia) ([place])   . Swelling of extremity 08-28-11   bilateral lower extremities- mid calf down    Past Surgical History:  Procedure Laterality Date  . ABDOMINAL HYSTERECTOMY  08-28-11   Partial-vaginal approach  . BREAST REDUCTION SURGERY  1997  . BUNIONECTOMY  08-28-11   right  . CARDIOVERSION N/A 08/05/2014   Procedure: CARDIOVERSION;  Surgeon: Larey Dresser, MD;  Location: [facility];  Service: Cardiovascular;  Laterality: N/A;  . CATARACT EXTRACTION, BILATERAL  08-28-11   bilateral  . COLONOSCOPY WITH PROPOFOL N/A 12/24/2016   Procedure: COLONOSCOPY WITH PROPOFOL;  Surgeon: Wonda Horner, MD;  Location: [facility] ENDOSCOPY;  Service: Endoscopy;  Laterality: N/A;  . EYE SURGERY     Cataract  . IR VERTEBROPLASTY EA ADDL (T&LS) BX INC UNI/BIL INC INJECT/IMAGING  10/22/2017  . IR VERTEBROPLASTY LUMBAR BX INC UNI/BIL INC/INJECT/IMAGING  10/22/2017  . IR VERTEBROPLASTY LUMBAR BX INC UNI/BIL INC/INJECT/IMAGING  02/02/2018  . JOINT REPLACEMENT  Oct. 1, 2012   Right Knee  . KNEE ARTHROSCOPY  09/06/2011   Procedure: ARTHROSCOPY KNEE;  Surgeon: Gearlean Alf, MD;  Location: [facility];  Service: Orthopedics;  Laterality: Right;  with Synovectomy    Current Medications: No outpatient medications have been marked as taking for the 05/14/18 encounter (Appointment) with Charlie Pitter, PA-C.   ***   Allergies:   Augmentin [amoxicillin-pot clavulanate]; Boniva [ibandronic acid]; Codeine; Hydromorphone; Latex; Lisinopril; and Adhesive [tape]   Social History   Socioeconomic History  . Marital status: Married    Spouse name: [name]  . Number of children: 3  . Years of education: 82  . Highest education level: Not on file  Occupational History  . Occupation: Retired- [employer]  [place]  . Financial resource strain: Not on file  . Food insecurity:    Worry: Not on file    Inability: Not on file  . Transportation needs:    Medical: Not on file    Non-medical: Not on file  Tobacco Use  . Smoking status: Never Smoker  . Smokeless tobacco: Never Used  Substance and Sexual Activity  . Alcohol use: No    Alcohol/week: 0.0 standard drinks  . Drug use: No  . Sexual activity: Not Currently    Partners: Male  Lifestyle  . Physical activity:    Days per week: Not on file    Minutes per session: Not on file  . Stress: Not on file  Relationships  . Social connections:    Talks on phone: Not on file    Gets together: Not on file    Attends religious service: Not on file    Active member of club or organization: Not on file    Attends meetings of clubs or organizations: Not on file    Relationship status: Not on file  Other Topics Concern  . Not on file  Social History Narrative   Lives with husband, Hilbert Odor at  home   Caffeine use: Drinks coffee/tea/caffeine free soda   OCCUPATION: retired      Family History:  The patient's ***family history includes Breast cancer in her sister; Colon cancer in her mother; Emphysema in her father; Heart attack in her brother; Heart disease in her father and mother; Hyperlipidemia in her mother; Hypertension in her father and mother; Stroke in her maternal grandmother.  ROS:   Please see the history of present illness. Otherwise, review of systems is positive for ***.  All other systems are reviewed and otherwise negative.    PHYSICAL EXAM:   VS:  There were no vitals taken for this visit.  BMI: There is no height or weight on file to calculate BMI. GEN: Well nourished, well developed, in no acute distress HEENT: normocephalic, atraumatic Neck: no JVD, carotid bruits, or masses Cardiac: ***RRR; no murmurs, rubs, or gallops, no edema  Respiratory:  clear to auscultation bilaterally, normal work of  breathing GI: soft, nontender, nondistended, + BS MS: no deformity or atrophy Skin: warm and dry, no rash Neuro:  Alert and Oriented x 3, Strength and sensation are intact, follows commands Psych: euthymic mood, full affect  Wt Readings from Last 3 Encounters:  04/14/18 157 lb 1.9 oz (71.3 kg)  03/20/18 153 lb (69.4 kg)  02/03/18 165 lb 12.6 oz (75.2 kg)      Studies/Labs Reviewed:   EKG:  EKG was ordered today and personally reviewed by me and demonstrates *** EKG was not ordered today.***  Recent Labs: 01/26/2018: B Natriuretic Peptide 384.8 01/28/2018: Magnesium 2.2 01/29/2018: ALT 30 01/30/2018: TSH 0.597 02/03/2018: BUN 23; Creatinine, Ser 1.63; Potassium 3.8; Sodium 145 04/14/2018: Hemoglobin 14.0; Platelets 207   Lipid Panel    Component Value Date/Time   CHOL 211 (H) 11/20/2017 1211   TRIG 136 11/20/2017 1211   HDL 113 11/20/2017 1211   CHOLHDL 1.9 11/20/2017 1211   CHOLHDL 3.3 05/27/2010 0505   VLDL 19 05/27/2010 0505   LDLCALC 71 11/20/2017 1211    Additional studies/ records that were reviewed today include: Summarized above.***    ASSESSMENT & PLAN:   1. ***  Disposition: F/u with ***   Medication Adjustments/Labs and Tests Ordered: Current medicines are reviewed at length with the patient today.  Concerns regarding medicines are outlined above. Medication changes, Labs and Tests ordered today are summarized above and listed in the Patient Instructions accessible in Encounters.   Signed, Charlie Pitter, PA-C  05/11/2018 12:35 PM    [facility] [place] [place], [place],   [address] Phone: [phone]; Fax: [phone]

## 2018-05-14 ENCOUNTER — Ambulatory Visit: Payer: Medicare Other | Admitting: Physician Assistant

## 2018-05-15 ENCOUNTER — Encounter: Payer: Self-pay | Admitting: Physician Assistant

## 2018-05-15 DIAGNOSIS — I130 Hypertensive heart and chronic kidney disease with heart failure and stage 1 through stage 4 chronic kidney disease, or unspecified chronic kidney disease: Secondary | ICD-10-CM | POA: Diagnosis not present

## 2018-05-15 DIAGNOSIS — I5042 Chronic combined systolic (congestive) and diastolic (congestive) heart failure: Secondary | ICD-10-CM | POA: Diagnosis not present

## 2018-05-15 DIAGNOSIS — J209 Acute bronchitis, unspecified: Secondary | ICD-10-CM | POA: Diagnosis not present

## 2018-05-15 DIAGNOSIS — S32020D Wedge compression fracture of second lumbar vertebra, subsequent encounter for fracture with routine healing: Secondary | ICD-10-CM | POA: Diagnosis not present

## 2018-05-15 DIAGNOSIS — S32030D Wedge compression fracture of third lumbar vertebra, subsequent encounter for fracture with routine healing: Secondary | ICD-10-CM | POA: Diagnosis not present

## 2018-05-15 DIAGNOSIS — N184 Chronic kidney disease, stage 4 (severe): Secondary | ICD-10-CM | POA: Diagnosis not present

## 2018-05-18 DIAGNOSIS — I130 Hypertensive heart and chronic kidney disease with heart failure and stage 1 through stage 4 chronic kidney disease, or unspecified chronic kidney disease: Secondary | ICD-10-CM | POA: Diagnosis not present

## 2018-05-18 DIAGNOSIS — I5042 Chronic combined systolic (congestive) and diastolic (congestive) heart failure: Secondary | ICD-10-CM | POA: Diagnosis not present

## 2018-05-18 DIAGNOSIS — N184 Chronic kidney disease, stage 4 (severe): Secondary | ICD-10-CM | POA: Diagnosis not present

## 2018-05-18 DIAGNOSIS — S32030D Wedge compression fracture of third lumbar vertebra, subsequent encounter for fracture with routine healing: Secondary | ICD-10-CM | POA: Diagnosis not present

## 2018-05-18 DIAGNOSIS — J209 Acute bronchitis, unspecified: Secondary | ICD-10-CM | POA: Diagnosis not present

## 2018-05-18 DIAGNOSIS — S32020D Wedge compression fracture of second lumbar vertebra, subsequent encounter for fracture with routine healing: Secondary | ICD-10-CM | POA: Diagnosis not present

## 2018-05-21 DIAGNOSIS — S32030D Wedge compression fracture of third lumbar vertebra, subsequent encounter for fracture with routine healing: Secondary | ICD-10-CM | POA: Diagnosis not present

## 2018-05-21 DIAGNOSIS — I130 Hypertensive heart and chronic kidney disease with heart failure and stage 1 through stage 4 chronic kidney disease, or unspecified chronic kidney disease: Secondary | ICD-10-CM | POA: Diagnosis not present

## 2018-05-21 DIAGNOSIS — N184 Chronic kidney disease, stage 4 (severe): Secondary | ICD-10-CM | POA: Diagnosis not present

## 2018-05-21 DIAGNOSIS — I5042 Chronic combined systolic (congestive) and diastolic (congestive) heart failure: Secondary | ICD-10-CM | POA: Diagnosis not present

## 2018-05-21 DIAGNOSIS — S32020D Wedge compression fracture of second lumbar vertebra, subsequent encounter for fracture with routine healing: Secondary | ICD-10-CM | POA: Diagnosis not present

## 2018-05-21 DIAGNOSIS — J209 Acute bronchitis, unspecified: Secondary | ICD-10-CM | POA: Diagnosis not present

## 2018-05-25 DIAGNOSIS — S32020D Wedge compression fracture of second lumbar vertebra, subsequent encounter for fracture with routine healing: Secondary | ICD-10-CM | POA: Diagnosis not present

## 2018-05-25 DIAGNOSIS — N184 Chronic kidney disease, stage 4 (severe): Secondary | ICD-10-CM | POA: Diagnosis not present

## 2018-05-25 DIAGNOSIS — I5042 Chronic combined systolic (congestive) and diastolic (congestive) heart failure: Secondary | ICD-10-CM | POA: Diagnosis not present

## 2018-05-25 DIAGNOSIS — S32030D Wedge compression fracture of third lumbar vertebra, subsequent encounter for fracture with routine healing: Secondary | ICD-10-CM | POA: Diagnosis not present

## 2018-05-25 DIAGNOSIS — I130 Hypertensive heart and chronic kidney disease with heart failure and stage 1 through stage 4 chronic kidney disease, or unspecified chronic kidney disease: Secondary | ICD-10-CM | POA: Diagnosis not present

## 2018-05-25 DIAGNOSIS — J209 Acute bronchitis, unspecified: Secondary | ICD-10-CM | POA: Diagnosis not present

## 2018-05-28 DIAGNOSIS — S32020D Wedge compression fracture of second lumbar vertebra, subsequent encounter for fracture with routine healing: Secondary | ICD-10-CM | POA: Diagnosis not present

## 2018-05-28 DIAGNOSIS — J209 Acute bronchitis, unspecified: Secondary | ICD-10-CM | POA: Diagnosis not present

## 2018-05-28 DIAGNOSIS — S32030D Wedge compression fracture of third lumbar vertebra, subsequent encounter for fracture with routine healing: Secondary | ICD-10-CM | POA: Diagnosis not present

## 2018-05-28 DIAGNOSIS — I130 Hypertensive heart and chronic kidney disease with heart failure and stage 1 through stage 4 chronic kidney disease, or unspecified chronic kidney disease: Secondary | ICD-10-CM | POA: Diagnosis not present

## 2018-05-28 DIAGNOSIS — I5042 Chronic combined systolic (congestive) and diastolic (congestive) heart failure: Secondary | ICD-10-CM | POA: Diagnosis not present

## 2018-05-28 DIAGNOSIS — N184 Chronic kidney disease, stage 4 (severe): Secondary | ICD-10-CM | POA: Diagnosis not present

## 2018-06-01 DIAGNOSIS — S32020D Wedge compression fracture of second lumbar vertebra, subsequent encounter for fracture with routine healing: Secondary | ICD-10-CM | POA: Diagnosis not present

## 2018-06-01 DIAGNOSIS — I130 Hypertensive heart and chronic kidney disease with heart failure and stage 1 through stage 4 chronic kidney disease, or unspecified chronic kidney disease: Secondary | ICD-10-CM | POA: Diagnosis not present

## 2018-06-01 DIAGNOSIS — S32030D Wedge compression fracture of third lumbar vertebra, subsequent encounter for fracture with routine healing: Secondary | ICD-10-CM | POA: Diagnosis not present

## 2018-06-01 DIAGNOSIS — I5042 Chronic combined systolic (congestive) and diastolic (congestive) heart failure: Secondary | ICD-10-CM | POA: Diagnosis not present

## 2018-06-01 DIAGNOSIS — J209 Acute bronchitis, unspecified: Secondary | ICD-10-CM | POA: Diagnosis not present

## 2018-06-01 DIAGNOSIS — N184 Chronic kidney disease, stage 4 (severe): Secondary | ICD-10-CM | POA: Diagnosis not present

## 2018-06-04 DIAGNOSIS — J209 Acute bronchitis, unspecified: Secondary | ICD-10-CM | POA: Diagnosis not present

## 2018-06-04 DIAGNOSIS — N184 Chronic kidney disease, stage 4 (severe): Secondary | ICD-10-CM | POA: Diagnosis not present

## 2018-06-04 DIAGNOSIS — S32030D Wedge compression fracture of third lumbar vertebra, subsequent encounter for fracture with routine healing: Secondary | ICD-10-CM | POA: Diagnosis not present

## 2018-06-04 DIAGNOSIS — I5042 Chronic combined systolic (congestive) and diastolic (congestive) heart failure: Secondary | ICD-10-CM | POA: Diagnosis not present

## 2018-06-04 DIAGNOSIS — I130 Hypertensive heart and chronic kidney disease with heart failure and stage 1 through stage 4 chronic kidney disease, or unspecified chronic kidney disease: Secondary | ICD-10-CM | POA: Diagnosis not present

## 2018-06-04 DIAGNOSIS — S32020D Wedge compression fracture of second lumbar vertebra, subsequent encounter for fracture with routine healing: Secondary | ICD-10-CM | POA: Diagnosis not present

## 2018-06-08 DIAGNOSIS — N184 Chronic kidney disease, stage 4 (severe): Secondary | ICD-10-CM | POA: Diagnosis not present

## 2018-06-08 DIAGNOSIS — S32020D Wedge compression fracture of second lumbar vertebra, subsequent encounter for fracture with routine healing: Secondary | ICD-10-CM | POA: Diagnosis not present

## 2018-06-08 DIAGNOSIS — M5135 Other intervertebral disc degeneration, thoracolumbar region: Secondary | ICD-10-CM | POA: Diagnosis not present

## 2018-06-08 DIAGNOSIS — M8580 Other specified disorders of bone density and structure, unspecified site: Secondary | ICD-10-CM | POA: Diagnosis not present

## 2018-06-08 DIAGNOSIS — J209 Acute bronchitis, unspecified: Secondary | ICD-10-CM | POA: Diagnosis not present

## 2018-06-08 DIAGNOSIS — I130 Hypertensive heart and chronic kidney disease with heart failure and stage 1 through stage 4 chronic kidney disease, or unspecified chronic kidney disease: Secondary | ICD-10-CM | POA: Diagnosis not present

## 2018-06-08 DIAGNOSIS — S32030D Wedge compression fracture of third lumbar vertebra, subsequent encounter for fracture with routine healing: Secondary | ICD-10-CM | POA: Diagnosis not present

## 2018-06-08 DIAGNOSIS — E440 Moderate protein-calorie malnutrition: Secondary | ICD-10-CM | POA: Diagnosis not present

## 2018-06-08 DIAGNOSIS — I5042 Chronic combined systolic (congestive) and diastolic (congestive) heart failure: Secondary | ICD-10-CM | POA: Diagnosis not present

## 2018-06-08 DIAGNOSIS — I480 Paroxysmal atrial fibrillation: Secondary | ICD-10-CM | POA: Diagnosis not present

## 2018-06-08 DIAGNOSIS — H409 Unspecified glaucoma: Secondary | ICD-10-CM | POA: Diagnosis not present

## 2018-06-08 DIAGNOSIS — Z7952 Long term (current) use of systemic steroids: Secondary | ICD-10-CM | POA: Diagnosis not present

## 2018-06-10 DIAGNOSIS — E440 Moderate protein-calorie malnutrition: Secondary | ICD-10-CM | POA: Diagnosis not present

## 2018-06-10 DIAGNOSIS — M8000XS Age-related osteoporosis with current pathological fracture, unspecified site, sequela: Secondary | ICD-10-CM | POA: Diagnosis not present

## 2018-06-10 DIAGNOSIS — E785 Hyperlipidemia, unspecified: Secondary | ICD-10-CM | POA: Diagnosis not present

## 2018-06-10 DIAGNOSIS — N184 Chronic kidney disease, stage 4 (severe): Secondary | ICD-10-CM | POA: Diagnosis not present

## 2018-06-10 DIAGNOSIS — R5381 Other malaise: Secondary | ICD-10-CM | POA: Diagnosis not present

## 2018-06-10 DIAGNOSIS — I129 Hypertensive chronic kidney disease with stage 1 through stage 4 chronic kidney disease, or unspecified chronic kidney disease: Secondary | ICD-10-CM | POA: Diagnosis not present

## 2018-06-10 DIAGNOSIS — F3341 Major depressive disorder, recurrent, in partial remission: Secondary | ICD-10-CM | POA: Diagnosis not present

## 2018-06-11 DIAGNOSIS — N184 Chronic kidney disease, stage 4 (severe): Secondary | ICD-10-CM | POA: Diagnosis not present

## 2018-06-11 DIAGNOSIS — I130 Hypertensive heart and chronic kidney disease with heart failure and stage 1 through stage 4 chronic kidney disease, or unspecified chronic kidney disease: Secondary | ICD-10-CM | POA: Diagnosis not present

## 2018-06-11 DIAGNOSIS — S32020D Wedge compression fracture of second lumbar vertebra, subsequent encounter for fracture with routine healing: Secondary | ICD-10-CM | POA: Diagnosis not present

## 2018-06-11 DIAGNOSIS — S32030D Wedge compression fracture of third lumbar vertebra, subsequent encounter for fracture with routine healing: Secondary | ICD-10-CM | POA: Diagnosis not present

## 2018-06-11 DIAGNOSIS — I5042 Chronic combined systolic (congestive) and diastolic (congestive) heart failure: Secondary | ICD-10-CM | POA: Diagnosis not present

## 2018-06-11 DIAGNOSIS — J209 Acute bronchitis, unspecified: Secondary | ICD-10-CM | POA: Diagnosis not present

## 2018-06-15 DIAGNOSIS — I130 Hypertensive heart and chronic kidney disease with heart failure and stage 1 through stage 4 chronic kidney disease, or unspecified chronic kidney disease: Secondary | ICD-10-CM | POA: Diagnosis not present

## 2018-06-15 DIAGNOSIS — S32020D Wedge compression fracture of second lumbar vertebra, subsequent encounter for fracture with routine healing: Secondary | ICD-10-CM | POA: Diagnosis not present

## 2018-06-15 DIAGNOSIS — N184 Chronic kidney disease, stage 4 (severe): Secondary | ICD-10-CM | POA: Diagnosis not present

## 2018-06-15 DIAGNOSIS — I5042 Chronic combined systolic (congestive) and diastolic (congestive) heart failure: Secondary | ICD-10-CM | POA: Diagnosis not present

## 2018-06-15 DIAGNOSIS — S32030D Wedge compression fracture of third lumbar vertebra, subsequent encounter for fracture with routine healing: Secondary | ICD-10-CM | POA: Diagnosis not present

## 2018-06-15 DIAGNOSIS — J209 Acute bronchitis, unspecified: Secondary | ICD-10-CM | POA: Diagnosis not present

## 2018-06-16 ENCOUNTER — Ambulatory Visit (HOSPITAL_COMMUNITY): Payer: Medicare Other | Attending: Cardiovascular Disease

## 2018-06-16 DIAGNOSIS — R42 Dizziness and giddiness: Secondary | ICD-10-CM | POA: Diagnosis not present

## 2018-06-16 DIAGNOSIS — R778 Other specified abnormalities of plasma proteins: Secondary | ICD-10-CM

## 2018-06-16 DIAGNOSIS — N184 Chronic kidney disease, stage 4 (severe): Secondary | ICD-10-CM

## 2018-06-16 DIAGNOSIS — R7989 Other specified abnormal findings of blood chemistry: Secondary | ICD-10-CM

## 2018-06-16 DIAGNOSIS — I480 Paroxysmal atrial fibrillation: Secondary | ICD-10-CM

## 2018-06-16 DIAGNOSIS — I5042 Chronic combined systolic (congestive) and diastolic (congestive) heart failure: Secondary | ICD-10-CM | POA: Diagnosis not present

## 2018-06-18 DIAGNOSIS — I130 Hypertensive heart and chronic kidney disease with heart failure and stage 1 through stage 4 chronic kidney disease, or unspecified chronic kidney disease: Secondary | ICD-10-CM | POA: Diagnosis not present

## 2018-06-18 DIAGNOSIS — J209 Acute bronchitis, unspecified: Secondary | ICD-10-CM | POA: Diagnosis not present

## 2018-06-18 DIAGNOSIS — S32020D Wedge compression fracture of second lumbar vertebra, subsequent encounter for fracture with routine healing: Secondary | ICD-10-CM | POA: Diagnosis not present

## 2018-06-18 DIAGNOSIS — S32030D Wedge compression fracture of third lumbar vertebra, subsequent encounter for fracture with routine healing: Secondary | ICD-10-CM | POA: Diagnosis not present

## 2018-06-18 DIAGNOSIS — I5042 Chronic combined systolic (congestive) and diastolic (congestive) heart failure: Secondary | ICD-10-CM | POA: Diagnosis not present

## 2018-06-18 DIAGNOSIS — N184 Chronic kidney disease, stage 4 (severe): Secondary | ICD-10-CM | POA: Diagnosis not present

## 2018-06-23 NOTE — Progress Notes (Signed)
Cardiology Office Note    Date:  06/24/2018  ID:  Heather Benson, DOB Mar 14, 1932, MRN [id number] PCP:  [name], MD  Cardiologist:  Larae Grooms, MD   Chief Complaint: discuss echo results  History of Present Illness:  Heather Benson is a 85 y.o. female with history of paroxysmal atrial fibrillation s/p cardioversion in the past (on amiodarone), cardiomyopathy (EF 35-40% in 01/2018) with chronic combined CHF (previously diastolic), mild CAD by cath 2012, mild AI, mild-moderate AS, mild-moderate MR, CKD stage IV, hypertension, acid reflux, chronic steroid use for ?cough/sarcoidosis, aortic atheroscleorsis, SVT, diverticular GIB 12/2016, vertebral compression fracture status post prior kyphoplasty who presents for f/u.    In 01/2018 she was admitted with weaknessand sepsis and found to have an elevated troponin level(peak 0.91),with echo showing an extensive new wall motion abnormality and moderately depressed left ventricular systolic function.She was initially felt to have syncope but notes clarified this was instead leg weakness. She was felt to have suspected PNA. Initial echocardiogram demonstrated reduced LV function with an EF of 35-40% and extensive new wall motion abnormalities (anteroseptal, anterior, inferior, apical hypokinesis). There was a question of whether or not this was a stress-induced cardiomyopathy versus an ischemic cardiomyopathy. A repeat echocardiogram prior to discharge did demonstrate an EF of 35-40% with anteroseptal, anterolateral and apical akinesis. Limited echo also showed grade 1 Dd, mild AS, mild-mod AI, mild MR, mild-mod LAE, mildly elevated PASP 27mHg. The patient did not want to undergo cardiac catheterization due to risk of nephrotoxicity. She underwent kyphoplasty prior to discharge and apixaban was then resumed. Carvedilol was initiated. She was not placed on ACE inhibitor or ARB secondary to advanced chronic kidney disease. She was discharged  to SNF then was released back home. She saw SRichardson Dopp11/8/19 and carvedilol was increased but she did not tolerate this due to worsened dizziness so it was decreased back again. There has also been a vertiginous component of this. F/u echo 06/16/18 showed mildly increased LV thickness, impaired diastolic relaxation, EF 530-86% normal LV filling pressures, severely dilated LA, mild AI, mild-moderate AS, mild-moderate MR. Last labs by nephrology 03/2018 BUN 28, Cr 1.76, Na 142, K 4.1, CBC 04/2018 showed Hgb 14, Plt 207.   She returns for follow-up with her daughter-in-law and husband. She has been making progress. She's not as deconditioned as she was a few months ago. She's been working with PT. Her dizziness, with somewhat of a vertigo plus orthostatic component persists, unchanged recently. They're not sure if she's on carvedilol 3.1261mor 6.2523m husband thinks it's 3.125m74mich was the intended dose. The dizziness has been present a LONG time, along with fatigue. She sleeps 11-12 hours a day. She does not snore. No CP. Has chronic DOE with higher levels of activity but not very active in general. [facility].  Past Medical History:  Diagnosis Date  . Aortic atherosclerosis (HCC)[place]  . Aortic regurgitation    a. mild-mod by echo 01/2018.  . AoMarland Kitchentic stenosis, mild 12/14/2015  . Arthritis 08-28-11   right knee pain-surgery planned  . CAD (coronary artery disease)    a. by cath 2012. b. Elev troponin 01/2018 -> cath not pursued due to renal dysfunction.  . [facility]   Melonoma-cheek(many Yrs ago) , recent bx. left  face lesion, past skin cancer lesions  . CAP (community acquired pneumonia) 12/13/2015  . Chronic combined systolic and diastolic heart failure (HCC)[place]  prob ischemic CM // admx with NSTEMI in 9/19 in setting of pneumonia // Echo 9/19: EF 35-40 // LHC avoided due to CKD //   . Complication of anesthesia 08-28-11   in  past arrythmia-  cardiology has seen in past  . Dilated cardiomyopathy ([place]) 03/20/2018   admx with NSTEMI in setting of prob pneumonia in 9/19 >> prob ischemic CM // Echo 9/19:  EF 35-40, anteroseptal, anterior, inferior, apical hypokinesis consistent with ischemia in the distribution of the LAD, grade 1 diastolic dysfunction, mild aortic stenosis (mean 7, peak 13), mild aortic insufficiency, moderate MAC, mild to moderate MR, moderate to severe LAE, mild RAE, moderate TR, PASP 59 //   . GERD (gastroesophageal reflux disease) 08-28-11   tx. omeprazole  . Hypercholesterolemia   . Hypertension 08-28-11   tx. meds  . IBS (irritable bowel syndrome)   . Lymphedema   . Mild mitral regurgitation   . Neuromuscular disorder ([place]) 08-28-11   hx. Polio age 46-slight residual affects legs  . Osteoarthritis    back and left knee  . Osteoporosis   . PAF (paroxysmal atrial fibrillation) ([place])   . Pneumonia 12/2015   hospital x 3 days  . PONV (postoperative nausea and vomiting)   . Renal cyst, right   . Sarcoidosis   . SVT (supraventricular tachycardia) ([place])   . Swelling of extremity 08-28-11   bilateral lower extremities- mid calf down    Past Surgical History:  Procedure Laterality Date  . ABDOMINAL HYSTERECTOMY  08-28-11   Partial-vaginal approach  . BREAST REDUCTION SURGERY  1997  . BUNIONECTOMY  08-28-11   right  . CARDIOVERSION N/A 08/05/2014   Procedure: CARDIOVERSION;  Surgeon: Larey Dresser, MD;  Location: [place];  Service: Cardiovascular;  Laterality: N/A;  . CATARACT EXTRACTION, BILATERAL  08-28-11   bilateral  . COLONOSCOPY WITH PROPOFOL N/A 12/24/2016   Procedure: COLONOSCOPY WITH PROPOFOL;  Surgeon: Wonda Horner, MD;  Location: [facility];  Service: Endoscopy;  Laterality: N/A;  . EYE SURGERY     Cataract  . IR VERTEBROPLASTY EA ADDL (T&LS) BX INC UNI/BIL INC INJECT/IMAGING  10/22/2017  . IR VERTEBROPLASTY LUMBAR BX INC UNI/BIL INC/INJECT/IMAGING  10/22/2017  . IR  VERTEBROPLASTY LUMBAR BX INC UNI/BIL INC/INJECT/IMAGING  02/02/2018  . JOINT REPLACEMENT  Oct. 1, 2012   Right Knee  . KNEE ARTHROSCOPY  09/06/2011   Procedure: ARTHROSCOPY KNEE;  Surgeon: Gearlean Alf, MD;  Location: [facility];  Service: Orthopedics;  Laterality: Right;  with Synovectomy    Current Medications: Current Meds  Medication Sig  . acetaminophen (TYLENOL) 500 MG tablet Take 1,000 mg by mouth every 8 (eight) hours as needed (for pain).   [name] amiodarone (PACERONE) 200 MG tablet Take 1 tablet (200 mg total) by mouth daily.  [name] apixaban (ELIQUIS) 2.5 MG TABS tablet Take 1 tablet (2.5 mg total) by mouth 2 (two) times daily. PLEASE START ON 02/03/18 AT 9PM  . atorvastatin (LIPITOR) 10 MG tablet Take 1 tablet (10 mg total) by mouth daily.  . furosemide (LASIX) 20 MG tablet Take 1 tablet (20 mg total) by mouth daily.  [name] latanoprost (XALATAN) 0.005 % ophthalmic solution Place 1 drop into both eyes at bedtime.  . mirtazapine (REMERON) 15 MG tablet Take 15 mg by mouth at bedtime.  . nitroGLYCERIN (NITROSTAT) 0.4 MG SL tablet Place 1 tablet (0.4 mg total) under the tongue every 5 (five) minutes as needed for chest pain.  . polyethylene glycol (MIRALAX / GLYCOLAX) packet  Take 17 g by mouth daily as needed.  . potassium chloride (K-DUR) 10 MEQ tablet Take 10 mEq by mouth daily.  . predniSONE (DELTASONE) 10 MG tablet Take 10 mg by mouth daily with breakfast.  . Respiratory Therapy Supplies (FLUTTER) DEVI Use as directed.  . [DISCONTINUED] carvedilol (COREG) 6.25 MG tablet Take 6.25 mg by mouth 2 (two) times daily.      Allergies:   Augmentin [amoxicillin-pot clavulanate]; Boniva [ibandronic acid]; Codeine; Hydromorphone; Latex; Lisinopril; and Adhesive [tape]   Social History   Socioeconomic History  . Marital status: Married    Spouse name: [name]  . Number of children: 3  . Years of education: 90  . Highest education level: Not on file  Occupational History  . Occupation: Retired- [employer]  . Financial resource strain: Not on file  . Food insecurity:    Worry: Not on file    Inability: Not on file  . Transportation needs:    Medical: Not on file    Non-medical: Not on file  Tobacco Use  . Smoking status: Never Smoker  . Smokeless tobacco: Never Used  Substance and Sexual Activity  . Alcohol use: No    Alcohol/week: 0.0 standard drinks  . Drug use: No  . Sexual activity: Not Currently    Partners: Male  Lifestyle  . Physical activity:    Days per week: Not on file    Minutes per session: Not on file  . Stress: Not on file  Relationships  . Social connections:    Talks on phone: Not on file    Gets together: Not on file    Attends religious service: Not on file    Active member of club or organization: Not on file    Attends meetings of clubs or organizations: Not on file    Relationship status: Not on file  Other Topics Concern  . Not on file  Social History Narrative   Lives with husband, Hilbert Odor at home   Caffeine use: Drinks coffee/tea/caffeine free soda   OCCUPATION: retired      Family History:  The patient's family history includes Breast cancer in her sister; Colon cancer in her mother; Emphysema in her father; Heart attack in her brother; Heart disease in her father and mother; Hyperlipidemia in her mother; Hypertension in her father and mother; Stroke in her maternal grandmother.  ROS:   Please see the history of present illness.  All other systems are reviewed and otherwise negative.    PHYSICAL EXAM:   VS:  BP 128/82   Pulse 66   Ht _0  (1.626 m)   Wt 151 lb (68.5 kg)   SpO2 96%   BMI 25.92 kg/m   BMI: Body mass index is 25.92 kg/m. GEN: Well nourished, well developed elderly WF, in no acute distress HEENT: normocephalic, atraumatic Neck: no JVD, carotid bruits, or masses Cardiac: RRR; minimal systolic murmur, no rubs or gallops, no edema  Respiratory:  clear to auscultation bilaterally, normal work of  breathing GI: soft, nontender, nondistended, + BS MS: no deformity or atrophy Skin: warm and dry, no rash Neuro:  Alert and Oriented x 3, Strength and sensation are intact, follows commands Psych: euthymic mood, full affect  Wt Readings from Last 3 Encounters:  06/24/18 151 lb (68.5 kg)  04/14/18 157 lb 1.9 oz (71.3 kg)  03/20/18 153 lb (69.4 kg)      Studies/Labs Reviewed:   EKG:   EKG was  not ordered today.   Recent Labs: 01/26/2018: B Natriuretic Peptide 384.8 01/28/2018: Magnesium 2.2 01/29/2018: ALT 30 01/30/2018: TSH 0.597 02/03/2018: BUN 23; Creatinine, Ser 1.63; Potassium 3.8; Sodium 145 04/14/2018: Hemoglobin 14.0; Platelets 207   Lipid Panel    Component Value Date/Time   CHOL 211 (H) 11/20/2017 1211   TRIG 136 11/20/2017 1211   HDL 113 11/20/2017 1211   CHOLHDL 1.9 11/20/2017 1211   CHOLHDL 3.3 05/27/2010 0505   VLDL 19 05/27/2010 0505   LDLCALC 71 11/20/2017 1211    Additional studies/ records that were reviewed today include: Summarized above.   ASSESSMENT & PLAN:   1. Chronic combined CHF - LVEF improved. Volume status looks great .She's also followed by nephrology. She continues to struggle with chronic fatigue and dizziness which seems present even before her recent cardiac hx. Nevertheless, given orthostatic component will try a trial off carvedilol for now. She will call on Monday with update in symptoms. I advised she f/u with PCP to discuss her dizziness and fatigue as it does not appear there's been an acute cardiac issue we can fix that would be causing this. 2. History of elevated troponin - no recent CP. Continue medical management. 3. Paroxysmal atrial fibrillation - remains in NSR by exam. Continue Eliquis. Apparently the last fill was pricier, DIL thinks due to new year deductible. She inquired about alternatives. I would be less inclined to switch to Coumadin. I told her she can review Xarelto coverage with her insurance company. Recent CBC  stable. 4. Aortic stenosis, aortic insufficiency, mitral regurgitation - follow clinically.  Disposition: F/u with Dr. Irish Lack in 6 months.  Medication Adjustments/Labs and Tests Ordered: Current medicines are reviewed at length with the patient today.  Concerns regarding medicines are outlined above. Medication changes, Labs and Tests ordered today are summarized above and listed in the Patient Instructions accessible in Encounters.   Signed, Charlie Pitter, PA-C  06/24/2018 2:16 PM    [facility] [address], [place], [place]  [address] Phone: [phone]; Fax: [phone]

## 2018-06-24 ENCOUNTER — Ambulatory Visit (INDEPENDENT_AMBULATORY_CARE_PROVIDER_SITE_OTHER): Payer: Medicare Other | Admitting: Physician Assistant

## 2018-06-24 ENCOUNTER — Encounter: Payer: Self-pay | Admitting: Physician Assistant

## 2018-06-24 VITALS — BP 128/82 | HR 66 | Ht 64.0 in | Wt 151.0 lb

## 2018-06-24 DIAGNOSIS — R7989 Other specified abnormal findings of blood chemistry: Secondary | ICD-10-CM | POA: Diagnosis not present

## 2018-06-24 DIAGNOSIS — I340 Nonrheumatic mitral (valve) insufficiency: Secondary | ICD-10-CM

## 2018-06-24 DIAGNOSIS — I5042 Chronic combined systolic (congestive) and diastolic (congestive) heart failure: Secondary | ICD-10-CM

## 2018-06-24 DIAGNOSIS — I480 Paroxysmal atrial fibrillation: Secondary | ICD-10-CM | POA: Diagnosis not present

## 2018-06-24 DIAGNOSIS — I352 Nonrheumatic aortic (valve) stenosis with insufficiency: Secondary | ICD-10-CM

## 2018-06-24 DIAGNOSIS — R778 Other specified abnormalities of plasma proteins: Secondary | ICD-10-CM

## 2018-06-24 NOTE — Patient Instructions (Signed)
Medication Instructions:  Your physician has recommended you make the following change in your medication:  1.  STOP the Carvedilol  If you need a refill on your cardiac medications before your next appointment, please call your pharmacy.   Lab work: None ordered  If you have labs (blood work) drawn today and your tests are completely normal, you will receive your results only by: [name] MyChart Message (if you have MyChart) OR . A paper copy in the mail If you have any lab test that is abnormal or we need to change your treatment, we will call you to review the results.  Testing/Procedures: One ordered  Follow-Up: At [facility], you and your health needs are our priority.  As part of our continuing mission to provide you with exceptional heart care, we have created designated Provider Care Teams.  These Care Teams include your primary Cardiologist (physician) and Advanced Practice Providers (APPs -  Physician Assistants and Nurse Practitioners) who all work together to provide you with the care you need, when you need it. You will need a follow up appointment in 6 months.  Please call our office 2 months in advance to schedule this appointment.  You may see Larae Grooms, MD or one of the following Advanced Practice Providers on your designated Care Team:   [place], PA-C Melina Copa, PA-C . Ermalinda Barrios, PA-C  Any Other Special Instructions Will Be Listed Below (If Applicable). Call us on Monday with an update on the weakness/fatigue Check with your insurance company about the Xarelto coverage

## 2018-06-26 DIAGNOSIS — R740 Nonspecific elevation of levels of transaminase and lactic acid dehydrogenase [LDH]: Secondary | ICD-10-CM | POA: Diagnosis not present

## 2018-06-30 ENCOUNTER — Telehealth: Payer: Self-pay | Admitting: Physician Assistant

## 2018-06-30 MED ORDER — CARVEDILOL 3.125 MG PO TABS: 3.1250 mg | ORAL_TABLET | Freq: Two times a day (BID) | ORAL | 3 refills | Status: DC

## 2018-06-30 NOTE — Telephone Encounter (Signed)
Returned [employer], DPR on file.  She has been made aware for pt to restart Coreg 3.125 bid and f/u with her pcp re: fatigue / dizziness. She verbalized understanding.

## 2018-06-30 NOTE — Addendum Note (Signed)
Addended by: Gaetano Net on: 06/30/2018 11:01 AM   Modules accepted: Orders

## 2018-06-30 NOTE — Telephone Encounter (Signed)
Patient's daughter in law Nola called, she stated Dayna had told her stop taking her beta blocker (did not know name of medication) to see if it would help with her tiredness and dizziness.  It has not made a difference. They want to know should she start taking it again.  Please call daughter in law or patient to advise.

## 2018-06-30 NOTE — Telephone Encounter (Signed)
She can restart at low dose, 3.125mg  BID, and f/u PCP for her dizziness/fatigue. Dayna Dunn PA-C

## 2018-07-09 DIAGNOSIS — M810 Age-related osteoporosis without current pathological fracture: Secondary | ICD-10-CM | POA: Diagnosis not present

## 2018-08-27 DIAGNOSIS — M25561 Pain in right knee: Secondary | ICD-10-CM | POA: Diagnosis not present

## 2018-08-27 DIAGNOSIS — M5431 Sciatica, right side: Secondary | ICD-10-CM | POA: Diagnosis not present

## 2018-09-17 DIAGNOSIS — I480 Paroxysmal atrial fibrillation: Secondary | ICD-10-CM | POA: Diagnosis not present

## 2018-09-17 DIAGNOSIS — K625 Hemorrhage of anus and rectum: Secondary | ICD-10-CM | POA: Diagnosis not present

## 2018-09-19 ENCOUNTER — Other Ambulatory Visit: Payer: Self-pay | Admitting: Interventional Cardiology

## 2018-09-22 ENCOUNTER — Telehealth: Payer: Self-pay

## 2018-09-22 NOTE — Telephone Encounter (Signed)
Left message for patient to call back to change appointment to virtual visit.  CRM created.

## 2018-09-23 ENCOUNTER — Encounter: Payer: Medicare Other | Admitting: Internal Medicine

## 2018-09-23 ENCOUNTER — Encounter: Payer: Self-pay | Admitting: Internal Medicine

## 2018-09-23 NOTE — Progress Notes (Deleted)
Name: Heather Benson  MRN/ DOB: [id number], June 01, 1931    Age/ Sex: 85 y.o., female    PCP: [name], MD   Reason for Endocrinology Evaluation: Osteoporosis      Date of Initial Endocrinology Evaluation: 09/23/2018     HPI: Ms. Heather Benson is a 85 y.o. female with a past medical history of A.Fib (S/P Cardioversion on Amiodarone) , cardiomyopathy (EF 35-40%), mild CAD and CKD IV, and Hx of vertebral compression fracture (S/P kyphoplasty in 10/2017 and 01/2018) . The patient presented for initial endocrinology clinic visit on 09/23/2018 for consultative assistance with her Osteoporosis  Pt was diagnosed with osteoporosis:  Menarche at age :  Menopausal at age :  Fracture Hx:  Hx of HRT:  FH of osteoporosis or hip fracture:  Prior Hx of anti-estrogenic therapy : Evista - stopped due to concerns  Of DVT Prior Hx of anti-resorptive therapy : Boniva - diarrhea, Fosamax - diarrhea, Prolia - knee pain    In review of her history she has seen Dr. Lorrene Reid in 2017 for hypercalcemia, she was found to have an elevated 1,25 dihydroxycholecalciferol levels and ACE levels, she was started on prednisone then for sarcoidosis and her hypercalcemia has resolved.   Currently she has low calcium levels    HISTORY:  Past Medical History:  Past Medical History:  Diagnosis Date  . Aortic atherosclerosis ([place]) 12/25/2016  . Aortic regurgitation    a. mild-mod by echo 01/2018.  [name] Aortic stenosis, mild 12/14/2015  . Arthritis 08-28-11   right knee pain-surgery planned  . CAD (coronary artery disease)    a. by cath 2012. b. Elev troponin 01/2018 -> cath not pursued due to renal dysfunction.  . Cancer [facility]) 08-28-11   Melonoma-cheek(many Yrs ago) , recent bx. left  face lesion, past skin cancer lesions  . CAP (community acquired pneumonia) 12/13/2015  . Chronic combined systolic and diastolic heart failure (HCC)    prob ischemic CM // admx with NSTEMI in 9/19 in setting of pneumonia // Echo  9/19: EF 35-40 // LHC avoided due to CKD //   . Complication of anesthesia 08-28-11   in past arrythmia-  cardiology has seen in past  . Dilated cardiomyopathy ([place]) 03/20/2018   admx with NSTEMI in setting of prob pneumonia in 9/19 >> prob ischemic CM // Echo 9/19:  EF 35-40, anteroseptal, anterior, inferior, apical hypokinesis consistent with ischemia in the distribution of the LAD, grade 1 diastolic dysfunction, mild aortic stenosis (mean 7, peak 13), mild aortic insufficiency, moderate MAC, mild to moderate MR, moderate to severe LAE, mild RAE, moderate TR, PASP 59 //   . GERD (gastroesophageal reflux disease) 08-28-11   tx. omeprazole  . Hypercholesterolemia   . Hypertension 08-28-11   tx. meds  . IBS (irritable bowel syndrome)   . Lymphedema   . Mild mitral regurgitation   . Neuromuscular disorder ([place]) 08-28-11   hx. Polio age 53-slight residual affects legs  . Osteoarthritis    back and left knee  . Osteoporosis   . PAF (paroxysmal atrial fibrillation) ([place])   . Pneumonia 12/2015   hospital x 3 days  . PONV (postoperative nausea and vomiting)   . Renal cyst, right   . Sarcoidosis   . SVT (supraventricular tachycardia) ([place])   . Swelling of extremity 08-28-11   bilateral lower extremities- mid calf down    Past Surgical History:  Past Surgical History:  Procedure Laterality Date  . ABDOMINAL HYSTERECTOMY  08-28-11  Partial-vaginal approach  . BREAST REDUCTION SURGERY  1997  . BUNIONECTOMY  08-28-11   right  . CARDIOVERSION N/A 08/05/2014   Procedure: CARDIOVERSION;  Surgeon: Larey Dresser, MD;  Location: [facility];  Service: Cardiovascular;  Laterality: N/A;  . CATARACT EXTRACTION, BILATERAL  08-28-11   bilateral  . COLONOSCOPY WITH PROPOFOL N/A 12/24/2016   Procedure: COLONOSCOPY WITH PROPOFOL;  Surgeon: Wonda Horner, MD;  Location: [facility] END[facility]COPY;  Service: Endoscopy;  Laterality: N/A;  . EYE SURGERY     Cataract  . IR VERTEBROPLASTY EA ADDL (T&LS) BX INC UNI/BIL INC  INJECT/IMAGING  10/22/2017  . IR VERTEBROPLASTY LUMBAR BX INC UNI/BIL INC/INJECT/IMAGING  10/22/2017  . IR VERTEBROPLASTY LUMBAR BX INC UNI/BIL INC/INJECT/IMAGING  02/02/2018  . JOINT REPLACEMENT  Oct. 1, 2012   Right Knee  . KNEE ARTHROSCOPY  09/06/2011   Procedure: ARTHROSCOPY KNEE;  Surgeon: Gearlean Alf, MD;  [facility];  Service: Orthopedics;  Laterality: Right;  with Synovectomy      Social History:  reports that she has never smoked. She has never used smokeless tobacco. She reports that she does not drink alcohol or use drugs.  Family History: family history includes Breast cancer in her sister; Colon cancer in her mother; Emphysema in her father; Heart attack in her brother; Heart disease in her father and mother; Hyperlipidemia in her mother; Hypertension in her father and mother; Stroke in her maternal grandmother.   HOME MEDICATIONS: Allergies as of 09/23/2018      Reactions   Augmentin [amoxicillin-pot Clavulanate] Diarrhea, Nausea Only   Boniva [ibandronic Acid] Diarrhea   Codeine Other (See Comments)   "Spaced out feeling" and Lightheadedness   Hydromorphone Nausea And Vomiting   Latex Rash   "Burns me"   Lisinopril Cough   Adhesive [tape] Itching, Rash      Medication List       Accurate as of Sep 23, 2018  8:34 AM. If you have any questions, ask your nurse or doctor.        acetaminophen 500 MG tablet Commonly known as:  TYLENOL Take 1,000 mg by mouth every 8 (eight) hours as needed (for pain).   amiodarone 200 MG tablet Commonly known as:  PACERONE TAKE 1 TABLET BY MOUTH EVERY DAY   apixaban 2.5 MG Tabs tablet Commonly known as:  Eliquis Take 1 tablet (2.5 mg total) by mouth 2 (two) times daily. PLEASE START ON 02/03/18 AT 9PM   atorvastatin 10 MG tablet Commonly known as:  LIPITOR Take 1 tablet (10 mg total) by mouth daily.   carvedilol 3.125 MG tablet Commonly known as:  COREG Take 1 tablet (3.125 mg total) by mouth 2 (two) times daily.    Flutter Devi Use as directed.   furosemide 20 MG tablet Commonly known as:  LASIX Take 1 tablet (20 mg total) by mouth daily.   latanoprost 0.005 % ophthalmic solution Commonly known as:  XALATAN Place 1 drop into both eyes at bedtime.   mirtazapine 15 MG tablet Commonly known as:  REMERON Take 15 mg by mouth at bedtime.   nitroGLYCERIN 0.4 MG SL tablet Commonly known as:  NITROSTAT Place 1 tablet (0.4 mg total) under the tongue every 5 (five) minutes as needed for chest pain.   polyethylene glycol 17 g packet Commonly known as:  MIRALAX / GLYCOLAX Take 17 g by mouth daily as needed.   potassium chloride 10 MEQ tablet Commonly known as:  K-DUR Take 10 mEq by mouth daily.  predniSONE 10 MG tablet Commonly known as:  DELTASONE Take 10 mg by mouth daily with breakfast.         REVIEW OF SYSTEMS: A comprehensive ROS was conducted with the patient and is negative except as per HPI and below:  ROS     OBJECTIVE:  VS: There were no vitals taken for this visit.   Wt Readings from Last 3 Encounters:  06/24/18 151 lb (68.5 kg)  04/14/18 157 lb 1.9 oz (71.3 kg)  03/20/18 153 lb (69.4 kg)     EXAM: General: Pt appears well and is in NAD  Hydration: Well-hydrated with moist mucous membranes and good skin turgor  Eyes: External eye exam normal without stare, lid lag or exophthalmos.  EOM intact.  PERRL.  Ears, Nose, Throat: Hearing: Grossly intact bilaterally Dental: Good dentition  Throat: Clear without mass, erythema or exudate  Neck: General: Supple without adenopathy. Thyroid: Thyroid size normal.  No goiter or nodules appreciated. No thyroid bruit.  Lungs: Clear with good BS bilat with no rales, rhonchi, or wheezes  Heart: Auscultation: RRR.  Abdomen: Normoactive bowel sounds, soft, nontender, without masses or organomegaly palpable  Extremities: Gait and station: Normal gait  Digits and nails: No clubbing, cyanosis, petechiae, or nodes Head and neck: Normal  alignment and mobility BL UE: Normal ROM and strength. BL LE: No pretibial edema normal ROM and strength.  Skin: Hair: Texture and amount normal with gender appropriate distribution Skin Inspection: No rashes, acanthosis nigricans/skin tags. No lipohypertrophy Skin Palpation: Skin temperature, texture, and thickness normal to palpation  Neuro: Cranial nerves: II - XII grossly intact  Cerebellar: Normal coordination and movement; no tremor Motor: Normal strength throughout DTRs: 2+ and symmetric in UE without delay in relaxation phase  Mental Status: Judgment, insight: Intact Orientation: Oriented to time, place, and person Memory: Intact for recent and remote events Mood and affect: No depression, anxiety, or agitation     DATA REVIEWED: ***    ASSESSMENT/PLAN/RECOMMENDATIONS:   1. ***    Medications :  Signed electronically by: Mack Guise, MD  [facility]  [facility] [address] [place], [place] [address] Phone: [phone] FAX: [phone]   CC: [name], MD [place] [address] Phone: [phone] Fax: [phone]   Return to Endocrinology clinic as below: Future Appointments  Date Time Provider [place]  09/23/2018  1:00 PM Judy Goodenow, Melanie Crazier, MD [id number] None

## 2018-09-24 ENCOUNTER — Ambulatory Visit (INDEPENDENT_AMBULATORY_CARE_PROVIDER_SITE_OTHER): Payer: Medicare Other | Admitting: Internal Medicine

## 2018-09-24 ENCOUNTER — Other Ambulatory Visit: Payer: Self-pay

## 2018-09-24 ENCOUNTER — Encounter: Payer: Self-pay | Admitting: Internal Medicine

## 2018-09-24 VITALS — BP 128/82 | HR 78 | Temp 98.4°F | Ht 64.0 in | Wt 148.2 lb

## 2018-09-24 DIAGNOSIS — M899 Disorder of bone, unspecified: Secondary | ICD-10-CM

## 2018-09-24 DIAGNOSIS — M8000XS Age-related osteoporosis with current pathological fracture, unspecified site, sequela: Secondary | ICD-10-CM

## 2018-09-24 DIAGNOSIS — N189 Chronic kidney disease, unspecified: Secondary | ICD-10-CM | POA: Diagnosis not present

## 2018-09-24 DIAGNOSIS — R634 Abnormal weight loss: Secondary | ICD-10-CM | POA: Diagnosis not present

## 2018-09-24 LAB — COMPREHENSIVE METABOLIC PANEL
ALT: 33 U/L (ref 0–35)
AST: 38 U/L — ABNORMAL HIGH (ref 0–37)
Albumin: 3.1 g/dL — ABNORMAL LOW (ref 3.5–5.2)
Alkaline Phosphatase: 322 U/L — ABNORMAL HIGH (ref 39–117)
BUN: 31 mg/dL — ABNORMAL HIGH (ref 6–23)
CO2: 31 mEq/L (ref 19–32)
Calcium: 8.7 mg/dL (ref 8.4–10.5)
Chloride: 105 mEq/L (ref 96–112)
Creatinine, Ser: 1.87 mg/dL — ABNORMAL HIGH (ref 0.40–1.20)
GFR: 25.6 mL/min — ABNORMAL LOW (ref >60.00)
Glucose, Bld: 93 mg/dL (ref 70–99)
Potassium: 3.9 mEq/L (ref 3.5–5.1)
Sodium: 147 mEq/L — ABNORMAL HIGH (ref 135–145)
Total Bilirubin: 0.8 mg/dL (ref 0.2–1.2)
Total Protein: 5.7 g/dL — ABNORMAL LOW (ref 6.0–8.3)

## 2018-09-24 LAB — MAGNESIUM: Magnesium: 2.3 mg/dL (ref 1.5–2.5)

## 2018-09-24 LAB — PHOSPHORUS: Phosphorus: 2.9 mg/dL (ref 2.3–4.6)

## 2018-09-24 LAB — TSH: TSH: 1.62 u[IU]/mL (ref 0.35–4.50)

## 2018-09-24 LAB — T4, FREE: Free T4: 1.48 ng/dL (ref 0.60–1.60)

## 2018-09-24 LAB — VITAMIN D 25 HYDROXY (VIT D DEFICIENCY, FRACTURES): VITD: 48.05 ng/mL (ref 30.00–100.00)

## 2018-09-24 NOTE — Patient Instructions (Signed)
-   I am not sure if you have straight osteoporosis or not at this time. People with chronic kidney disease doe have a different type of bone disease that may look like osteoporosis but requires different treatments.

## 2018-09-24 NOTE — Progress Notes (Addendum)
Name: Heather Benson  MRN/ DOB: [id number], 1931-11-10    Age/ Sex: 85 y.o., female    PCP: [name], MD   Reason for Endocrinology Evaluation: Osteoporosis      Date of Initial Endocrinology Evaluation: 09/24/2018     HPI: Heather Benson is a 85 y.o. female with a past medical history of A.Fib (S/P Cardioversion on Amiodarone) , cardiomyopathy (EF 35-40%), mild CAD and CKD IV, and Hx of vertebral compression fracture (S/P kyphoplasty in 10/2017 and 01/2018) . The patient presented for initial endocrinology clinic visit on 09/24/2018 for consultative assistance with her Osteoporosis  Pt was diagnosed with osteoporosis:in 2011  Menarche at age : 77 Menopausal at age : does not recall  Fracture Hx: T12, 2 lumbar vertebrae  Hx of HRT: no FH of osteoporosis or hip fracture: Sister with osteoporosis   Prior Hx of anti-estrogenic therapy : Evista - stopped due to concerns  Of DVT Prior Hx of anti-resorptive therapy : Boniva - diarrhea, Fosamax - diarrhea, Prolia - knee pain    In review of her history she has seen Dr. Lorrene Reid in 2017 for hypercalcemia, she was found to have an elevated 1,25 dihydroxycholecalciferol levels and ACE levels, she was started on prednisone then for sarcoidosis and her hypercalcemia has resolved.   She denies any complaints at this time , except difficulty to ambulate properly and having to use assistance.   HISTORY:  Past Medical History:  Past Medical History:  Diagnosis Date  . Aortic atherosclerosis ([place]) 12/25/2016  . Aortic regurgitation    a. mild-mod by echo 01/2018.  [name] Aortic stenosis, mild 12/14/2015  . Arthritis 08-28-11   right knee pain-surgery planned  . CAD (coronary artery disease)    a. by cath 2012. b. Elev troponin 01/2018 -> cath not pursued due to renal dysfunction.  . Cancer [facility]) 08-28-11   Melonoma-cheek(many Yrs ago) , recent bx. left  face lesion, past skin cancer lesions  . CAP (community acquired pneumonia) 12/13/2015   . Chronic combined systolic and diastolic heart failure (HCC)    prob ischemic CM // admx with NSTEMI in 9/19 in setting of pneumonia // Echo 9/19: EF 35-40 // LHC avoided due to CKD //   . Complication of anesthesia 08-28-11   in past arrythmia-  cardiology has seen in past  . Dilated cardiomyopathy ([place]) 03/20/2018   admx with NSTEMI in setting of prob pneumonia in 9/19 >> prob ischemic CM // Echo 9/19:  EF 35-40, anteroseptal, anterior, inferior, apical hypokinesis consistent with ischemia in the distribution of the LAD, grade 1 diastolic dysfunction, mild aortic stenosis (mean 7, peak 13), mild aortic insufficiency, moderate MAC, mild to moderate MR, moderate to severe LAE, mild RAE, moderate TR, PASP 59 //   . GERD (gastroesophageal reflux disease) 08-28-11   tx. omeprazole  . Hypercholesterolemia   . Hypertension 08-28-11   tx. meds  . IBS (irritable bowel syndrome)   . Lymphedema   . Mild mitral regurgitation   . Neuromuscular disorder ([place]) 08-28-11   hx. Polio age 38-slight residual affects legs  . Osteoarthritis    back and left knee  . Osteoporosis   . PAF (paroxysmal atrial fibrillation) ([place])   . Pneumonia 12/2015   hospital x 3 days  . PONV (postoperative nausea and vomiting)   . Renal cyst, right   . Sarcoidosis   . SVT (supraventricular tachycardia) ([place])   . Swelling of extremity 08-28-11   bilateral lower extremities- mid  calf down   Past Surgical History:  Past Surgical History:  Procedure Laterality Date  . ABDOMINAL HYSTERECTOMY  08-28-11   Partial-vaginal approach  . BREAST REDUCTION SURGERY  1997  . BUNIONECTOMY  08-28-11   right  . CARDIOVERSION N/A 08/05/2014   Procedure: CARDIOVERSION;  Surgeon: Larey Dresser, MD;  Location: [place];  Service: Cardiovascular;  Laterality: N/A;  . CATARACT EXTRACTION, BILATERAL  08-28-11   bilateral  . COLONOSCOPY WITH PROPOFOL N/A 12/24/2016   Procedure: COLONOSCOPY WITH PROPOFOL;  Surgeon: Wonda Horner, MD;   Location: [facility] ENDOSCOPY;  Service: Endoscopy;  Laterality: N/A;  . EYE SURGERY     Cataract  . IR VERTEBROPLASTY EA ADDL (T&LS) BX INC UNI/BIL INC INJECT/IMAGING  10/22/2017  . IR VERTEBROPLASTY LUMBAR BX INC UNI/BIL INC/INJECT/IMAGING  10/22/2017  . IR VERTEBROPLASTY LUMBAR BX INC UNI/BIL INC/INJECT/IMAGING  02/02/2018  . JOINT REPLACEMENT  Oct. 1, 2012   Right Knee  . KNEE ARTHROSCOPY  09/06/2011   Procedure: ARTHROSCOPY KNEE;  Surgeon: Gearlean Alf, MD;  Location: [facility] ORS;  Service: Orthopedics;  Laterality: Right;  with Synovectomy      Social History:  reports that she has never smoked. She has never used smokeless tobacco. She reports that she does not drink alcohol or use drugs.  Family History: family history includes Breast cancer in her sister; Colon cancer in her mother; Emphysema in her father; Heart attack in her brother; Heart disease in her father and mother; Hyperlipidemia in her mother; Hypertension in her father and mother; Stroke in her maternal grandmother.   HOME MEDICATIONS: Allergies as of 09/24/2018      Reactions   Augmentin [amoxicillin-pot Clavulanate] Diarrhea, Nausea Only   Boniva [ibandronic Acid] Diarrhea   Codeine Other (See Comments)   "Spaced out feeling" and Lightheadedness   Hydromorphone Nausea And Vomiting   Latex Rash   "Burns me"   Lisinopril Cough   Adhesive [tape] Itching, Rash      Medication List       Accurate as of Sep 24, 2018  1:25 PM. If you have any questions, ask your nurse or doctor.        acetaminophen 500 MG tablet Commonly known as:  TYLENOL Take 1,000 mg by mouth every 8 (eight) hours as needed (for pain).   amiodarone 200 MG tablet Commonly known as:  PACERONE TAKE 1 TABLET BY MOUTH EVERY DAY   apixaban 2.5 MG Tabs tablet Commonly known as:  Eliquis Take 1 tablet (2.5 mg total) by mouth 2 (two) times daily. PLEASE START ON 02/03/18 AT 9PM   atorvastatin 10 MG tablet Commonly known as:  LIPITOR Take 1 tablet (10  mg total) by mouth daily.   carvedilol 3.125 MG tablet Commonly known as:  COREG Take 1 tablet (3.125 mg total) by mouth 2 (two) times daily.   Flutter Devi Use as directed.   furosemide 20 MG tablet Commonly known as:  LASIX Take 1 tablet (20 mg total) by mouth daily.   latanoprost 0.005 % ophthalmic solution Commonly known as:  XALATAN Place 1 drop into both eyes at bedtime.   mirtazapine 15 MG tablet Commonly known as:  REMERON Take 15 mg by mouth at bedtime.   nitroGLYCERIN 0.4 MG SL tablet Commonly known as:  NITROSTAT Place 1 tablet (0.4 mg total) under the tongue every 5 (five) minutes as needed for chest pain.   polyethylene glycol 17 g packet Commonly known as:  MIRALAX / GLYCOLAX Take 17 g  by mouth daily as needed.   potassium chloride 10 MEQ tablet Commonly known as:  K-DUR Take 10 mEq by mouth daily.   predniSONE 10 MG tablet Commonly known as:  DELTASONE Take 10 mg by mouth daily with breakfast.         REVIEW OF SYSTEMS: A comprehensive ROS was conducted with the patient and is negative except as per HPI and below:  Review of Systems  Constitutional: Positive for weight loss. Negative for fever and malaise/fatigue.  HENT: Negative for congestion and sore throat.   Respiratory: Negative for cough and shortness of breath.   Cardiovascular: Negative for chest pain.  Gastrointestinal: Negative for diarrhea and nausea.  Genitourinary: Negative for frequency.  Neurological: Positive for tremors and weakness.  Endo/Heme/Allergies: Negative for polydipsia.  Psychiatric/Behavioral: Negative for depression. The patient is not nervous/anxious.        OBJECTIVE:  VS: BP 128/82 (BP Location: Right Arm, Patient Position: Sitting, Cuff Size: Normal)   Pulse 78   Temp 98.4 F (36.9 C)   Ht _0  (1.626 m)   Wt 148 lb 3.2 oz (67.2 kg)   SpO2 95%   BMI 25.44 kg/m    Wt Readings from Last 3 Encounters:  09/24/18 148 lb 3.2 oz (67.2 kg)  06/24/18 151 lb  (68.5 kg)  04/14/18 157 lb 1.9 oz (71.3 kg)     EXAM: General: Pt appears well in a wheelchair   Hydration: Well-hydrated with moist mucous membranes and good skin turgor  Eyes: External eye exam normal without stare, lid lag or exophthalmos.  EOM intact.  Ears, Nose, Throat: Hearing: Grossly intact bilaterally Dental: Upper dentures Throat: Clear without mass, erythema or exudate  Neck: General: Supple without adenopathy. Thyroid: Thyroid size normal.  No goiter or nodules appreciated. No thyroid bruit.  Lungs: Clear with good BS bilat with no rales, rhonchi, or wheezes  Heart: Auscultation: RRR.  Abdomen: Normoactive bowel sounds, soft, nontender, without masses or organomegaly palpable  Extremities:  BL LE: No pretibial edema, B/L foot edema present   Skin: Hair: Texture and amount normal with gender appropriate distribution Skin Inspection: Multiple ecchymotic lesions on extremities Skin Palpation: Skin temperature, texture, and thickness normal to palpation  Neuro: Cranial nerves: II - XII grossly intact  Motor: Decreased strength throughout 4/5  Mental Status: Judgment, insight: Intact Orientation: Oriented to time, place, and person Mood and affect: No depression, anxiety, or agitation     DATA REVIEWED: Results for AZAIAH, MELLO (MRN [id number]) as of 09/28/2018 11:20  Ref. Range 09/24/2018 11:02 09/24/2018 11:02  Sodium Latest Ref Range: 135 - 145 mEq/L 147 (H)   Potassium Latest Ref Range: 3.5 - 5.1 mEq/L 3.9   Chloride Latest Ref Range: 96 - 112 mEq/L 105   CO2 Latest Ref Range: 19 - 32 mEq/L 31   Glucose Latest Ref Range: 70 - 99 mg/dL 93   BUN Latest Ref Range: 6 - 23 mg/dL 31 (H)   Creatinine Latest Ref Range: 0.40 - 1.20 mg/dL 1.87 (H)   Calcium Latest Ref Range: 8.6 - 10.4 mg/dL 8.7 8.8  Phosphorus Latest Ref Range: 2.3 - 4.6 mg/dL 2.9   Magnesium Latest Ref Range: 1.5 - 2.5 mg/dL 2.3   Alkaline Phosphatase Latest Ref Range: 39 - 117 U/L 322 (H)   Albumin  Latest Ref Range: 3.5 - 5.2 g/dL 3.1 (L)   AST Latest Ref Range: 0 - 37 U/L 38 (H)   ALT Latest Ref Range: 0 - 35 U/L  33   Total Protein Latest Ref Range: 6.0 - 8.3 g/dL 5.7 (L)   Total Bilirubin Latest Ref Range: 0.2 - 1.2 mg/dL 0.8   GFR Latest Ref Range: >60.00 mL/min 25.60 (L)   VITD Latest Ref Range: 30.00 - 100.00 ng/mL 48.05   Vitamin D 1, 25 (OH) Total Latest Ref Range: 18 - 72 pg/mL  43  Vitamin D2 1, 25 (OH) Latest Units: pg/mL  <8  Vitamin D3 1, 25 (OH) Latest Units: pg/mL  43  PTH, Intact Latest Ref Range: 14 - 64 pg/mL  48  TSH Latest Ref Range: 0.35 - 4.50 uIU/mL 1.62   T4,Free(Direct) Latest Ref Range: 0.60 - 1.60 ng/dL 1.48     DXA 12/31/2012                  T-Score          Z-Score  Left Neck                            -1.9                     0.6 Total                                    -2.0                   -0.9  Right neck                           -2.6                     -1.3 Total                                    -2.3                    -1.2   Left forearm                         -3.8                     -1.2     ASSESSMENT/PLAN/RECOMMENDATIONS:   1. Osteoporosis with compression fractures:    It is difficult to diagnose osteoporosis in the setting of CKD. This differentiation is important as the management of osteoporosis differs from the management of bone disease in patients with CKD.  In CKD- MBD (mineral and bone disorder ) abnormalities involve more then just a low bone mass but also abnormality of calcium, phosphorus,PTh, FGF23 and vitamin D metabolism.  The gold standard for diagnosis and classification of bone disease in CKD is bone biopsy. However, since bone biopsy is invasive and costly, we would use bone biomarkers for the diagnosis and monitoring of bone turnover.   Treatment of patients with CKD-MBD varies depending upon the prevailing metabolic abnormality, the characteristic of bone disease and the severity of underlying kidney  dysfunction.  We will start with lab work first and proceed from there.   - PTH, Calcium, 1,25 dihydroxyvitamin D are all normal. Awaiting on bone biomarkers prior to making a final decision on treatment.    2. Weight loss :  -  She has lost 18 lbs in the past 6 months. Pt denies any loss of appetite,she has been eating well. She also denies depression.  - Her thyroid function has been normal in the past, repeat testing is normal.  - I will defer further work up to her PCP     F/u in 6 months     Signed electronically by: Mack Guise, MD  [facility] Endocrinology  [facility] [address] [place], [place] [address] Phone: [phone] FAX: [phone]   CC: [name], MD [place] [address] Phone: [phone] Fax: [phone]   Return to Endocrinology clinic as below: Future Appointments  Date Time Provider [place]  03/25/2019 10:30 AM Shamleffer, Melanie Crazier, MD [facility] None

## 2018-09-30 LAB — VITAMIN D 1,25 DIHYDROXY
Vitamin D 1, 25 (OH)2 Total: 43 pg/mL (ref 18–72)
Vitamin D2 1, 25 (OH)2: <8 pg/mL
Vitamin D3 1, 25 (OH)2: 43 pg/mL

## 2018-09-30 LAB — PTH, INTACT AND CALCIUM
Calcium: 8.8 mg/dL (ref 8.6–10.4)
PTH: 48 pg/mL (ref 14–64)

## 2018-09-30 LAB — C-TERMINAL TELOPEPTIDE: C-Telopeptide (CTx): 642 pg/mL

## 2018-09-30 LAB — N-TELOPEPTIDE, CROSS-LINKED, SERUM: N-Telopeptide: >40 nmol{BCE} — ABNORMAL HIGH (ref 6.2–19.0)

## 2018-10-12 ENCOUNTER — Telehealth: Payer: Self-pay | Admitting: Internal Medicine

## 2018-10-12 NOTE — Telephone Encounter (Signed)
Reviewed lab data as below    Results for Heather Benson, Heather Benson (MRN [id number]) as of 10/12/2018 12:48  Ref. Range 09/24/2018 11:02 09/24/2018 11:02  Sodium Latest Ref Range: 135 - 145 mEq/L 147 (H)   Potassium Latest Ref Range: 3.5 - 5.1 mEq/L 3.9   Chloride Latest Ref Range: 96 - 112 mEq/L 105   CO2 Latest Ref Range: 19 - 32 mEq/L 31   Glucose Latest Ref Range: 70 - 99 mg/dL 93   BUN Latest Ref Range: 6 - 23 mg/dL 31 (H)   Creatinine Latest Ref Range: 0.40 - 1.20 mg/dL 1.87 (H)   Calcium Latest Ref Range: 8.6 - 10.4 mg/dL 8.7 8.8  Phosphorus Latest Ref Range: 2.3 - 4.6 mg/dL 2.9   Magnesium Latest Ref Range: 1.5 - 2.5 mg/dL 2.3   Alkaline Phosphatase Latest Ref Range: 39 - 117 U/L 322 (H)   Albumin Latest Ref Range: 3.5 - 5.2 g/dL 3.1 (L)   AST Latest Ref Range: 0 - 37 U/L 38 (H)   ALT Latest Ref Range: 0 - 35 U/L 33   Total Protein Latest Ref Range: 6.0 - 8.3 g/dL 5.7 (L)   Total Bilirubin Latest Ref Range: 0.2 - 1.2 mg/dL 0.8   GFR Latest Ref Range: >60.00 mL/min 25.60 (L)   VITD Latest Ref Range: 30.00 - 100.00 ng/mL 48.05   Vitamin D 1, 25 (OH) Total Latest Ref Range: 18 - 72 pg/mL  43  Vitamin D2 1, 25 (OH) Latest Units: pg/mL  <8  Vitamin D3 1, 25 (OH) Latest Units: pg/mL  43  PTH, Intact Latest Ref Range: 14 - 64 pg/mL  48  TSH Latest Ref Range: 0.35 - 4.50 uIU/mL 1.62   T4,Free(Direct) Latest Ref Range: 0.60 - 1.60 ng/dL 1.48   Results for [name], [name] (MRN [id number]) as of 10/12/2018 12:48  Ref. Range 09/24/2018 11:02  N-Telopeptide Latest Ref Range: 6.2 - 19.0 nM BCE >40.0 (H)  Results for [name], [name] (MRN [id number]) as of 10/12/2018 12:48  Ref. Range 09/24/2018 11:02  C-Telopeptide (CTx) Latest Ref Range:  pg/mL 642     Recommendations   With normal PTH , there's a component of adynamic bone disease present, but given the elevated N-telopeptide and elevated Alkaline phosphatase, she definitely has increase bone resorption.  I will offer her  Prolia Q 6 months. Pt  has tried this before, and developed knee pains at the time, at this time no knee pain. We discussed the risk vs benefits. We are limited in anti-resorptive therapy due to low GFR. She declines daily injectables .   Prolia, could reduce her risk of future fractures, but will not eliminate it.  I will recheck bone tumor markers on next visit to see if prolia is working.   Pt agreed to giving prolia another chance A message to our referral coordinator was sent to start the approval process.     Abby Nena Jordan, MD  [facility] Endocrinology  [facility] [address] [place], [place] [address] Phone: [phone] FAX: [phone]

## 2018-10-20 DIAGNOSIS — R634 Abnormal weight loss: Secondary | ICD-10-CM | POA: Insufficient documentation

## 2018-10-20 DIAGNOSIS — M810 Age-related osteoporosis without current pathological fracture: Secondary | ICD-10-CM | POA: Insufficient documentation

## 2018-11-06 DIAGNOSIS — S22000D Wedge compression fracture of unspecified thoracic vertebra, subsequent encounter for fracture with routine healing: Secondary | ICD-10-CM | POA: Diagnosis not present

## 2018-11-06 DIAGNOSIS — M545 Low back pain: Secondary | ICD-10-CM | POA: Diagnosis not present

## 2018-11-12 DIAGNOSIS — M545 Low back pain: Secondary | ICD-10-CM | POA: Diagnosis not present

## 2018-11-17 DIAGNOSIS — I420 Dilated cardiomyopathy: Secondary | ICD-10-CM | POA: Diagnosis not present

## 2018-11-17 DIAGNOSIS — Z1159 Encounter for screening for other viral diseases: Secondary | ICD-10-CM | POA: Diagnosis not present

## 2018-11-17 DIAGNOSIS — S32000A Wedge compression fracture of unspecified lumbar vertebra, initial encounter for closed fracture: Secondary | ICD-10-CM | POA: Diagnosis not present

## 2018-11-17 DIAGNOSIS — I129 Hypertensive chronic kidney disease with stage 1 through stage 4 chronic kidney disease, or unspecified chronic kidney disease: Secondary | ICD-10-CM | POA: Diagnosis not present

## 2018-11-17 DIAGNOSIS — N184 Chronic kidney disease, stage 4 (severe): Secondary | ICD-10-CM | POA: Diagnosis not present

## 2018-11-17 DIAGNOSIS — D869 Sarcoidosis, unspecified: Secondary | ICD-10-CM | POA: Diagnosis not present

## 2018-11-17 DIAGNOSIS — I480 Paroxysmal atrial fibrillation: Secondary | ICD-10-CM | POA: Diagnosis not present

## 2018-11-20 ENCOUNTER — Inpatient Hospital Stay (HOSPITAL_COMMUNITY)
Admission: EM | Admit: 2018-11-20 | Discharge: 2018-12-05 | DRG: 391 | Disposition: A | Discharge status: Hospice | Payer: Medicare Other | Attending: Internal Medicine | Admitting: Internal Medicine

## 2018-11-20 ENCOUNTER — Emergency Department (HOSPITAL_COMMUNITY): Payer: Medicare Other

## 2018-11-20 ENCOUNTER — Encounter (HOSPITAL_COMMUNITY): Payer: Self-pay

## 2018-11-20 ENCOUNTER — Other Ambulatory Visit: Payer: Self-pay

## 2018-11-20 DIAGNOSIS — K219 Gastro-esophageal reflux disease without esophagitis: Secondary | ICD-10-CM | POA: Diagnosis present

## 2018-11-20 DIAGNOSIS — I10 Essential (primary) hypertension: Secondary | ICD-10-CM | POA: Diagnosis not present

## 2018-11-20 DIAGNOSIS — Z7901 Long term (current) use of anticoagulants: Secondary | ICD-10-CM

## 2018-11-20 DIAGNOSIS — Z6822 Body mass index (BMI) 22.0-22.9, adult: Secondary | ICD-10-CM | POA: Diagnosis not present

## 2018-11-20 DIAGNOSIS — R531 Weakness: Secondary | ICD-10-CM | POA: Diagnosis not present

## 2018-11-20 DIAGNOSIS — Z7952 Long term (current) use of systemic steroids: Secondary | ICD-10-CM

## 2018-11-20 DIAGNOSIS — I700 Atherosclerosis of aorta: Secondary | ICD-10-CM | POA: Diagnosis present

## 2018-11-20 DIAGNOSIS — M48061 Spinal stenosis, lumbar region without neurogenic claudication: Secondary | ICD-10-CM | POA: Diagnosis present

## 2018-11-20 DIAGNOSIS — I352 Nonrheumatic aortic (valve) stenosis with insufficiency: Secondary | ICD-10-CM | POA: Diagnosis present

## 2018-11-20 DIAGNOSIS — I420 Dilated cardiomyopathy: Secondary | ICD-10-CM | POA: Diagnosis present

## 2018-11-20 DIAGNOSIS — R29898 Other symptoms and signs involving the musculoskeletal system: Secondary | ICD-10-CM | POA: Diagnosis not present

## 2018-11-20 DIAGNOSIS — Z741 Need for assistance with personal care: Secondary | ICD-10-CM | POA: Diagnosis not present

## 2018-11-20 DIAGNOSIS — Z7401 Bed confinement status: Secondary | ICD-10-CM | POA: Diagnosis not present

## 2018-11-20 DIAGNOSIS — G14 Postpolio syndrome: Secondary | ICD-10-CM | POA: Diagnosis not present

## 2018-11-20 DIAGNOSIS — D631 Anemia in chronic kidney disease: Secondary | ICD-10-CM | POA: Diagnosis present

## 2018-11-20 DIAGNOSIS — R627 Adult failure to thrive: Secondary | ICD-10-CM

## 2018-11-20 DIAGNOSIS — N2889 Other specified disorders of kidney and ureter: Secondary | ICD-10-CM | POA: Diagnosis not present

## 2018-11-20 DIAGNOSIS — D72829 Elevated white blood cell count, unspecified: Secondary | ICD-10-CM | POA: Diagnosis not present

## 2018-11-20 DIAGNOSIS — H409 Unspecified glaucoma: Secondary | ICD-10-CM | POA: Diagnosis not present

## 2018-11-20 DIAGNOSIS — Z9889 Other specified postprocedural states: Secondary | ICD-10-CM | POA: Diagnosis not present

## 2018-11-20 DIAGNOSIS — K589 Irritable bowel syndrome without diarrhea: Secondary | ICD-10-CM | POA: Diagnosis present

## 2018-11-20 DIAGNOSIS — Z66 Do not resuscitate: Secondary | ICD-10-CM | POA: Diagnosis present

## 2018-11-20 DIAGNOSIS — K7689 Other specified diseases of liver: Secondary | ICD-10-CM | POA: Diagnosis not present

## 2018-11-20 DIAGNOSIS — G8929 Other chronic pain: Secondary | ICD-10-CM

## 2018-11-20 DIAGNOSIS — K5792 Diverticulitis of intestine, part unspecified, without perforation or abscess without bleeding: Secondary | ICD-10-CM | POA: Diagnosis not present

## 2018-11-20 DIAGNOSIS — Z8249 Family history of ischemic heart disease and other diseases of the circulatory system: Secondary | ICD-10-CM

## 2018-11-20 DIAGNOSIS — I340 Nonrheumatic mitral (valve) insufficiency: Secondary | ICD-10-CM | POA: Diagnosis not present

## 2018-11-20 DIAGNOSIS — M8088XA Other osteoporosis with current pathological fracture, vertebra(e), initial encounter for fracture: Secondary | ICD-10-CM | POA: Diagnosis present

## 2018-11-20 DIAGNOSIS — B9620 Unspecified Escherichia coli [E. coli] as the cause of diseases classified elsewhere: Secondary | ICD-10-CM | POA: Diagnosis present

## 2018-11-20 DIAGNOSIS — Z885 Allergy status to narcotic agent status: Secondary | ICD-10-CM

## 2018-11-20 DIAGNOSIS — M2550 Pain in unspecified joint: Secondary | ICD-10-CM | POA: Diagnosis not present

## 2018-11-20 DIAGNOSIS — N183 Chronic kidney disease, stage 3 (moderate): Secondary | ICD-10-CM | POA: Diagnosis not present

## 2018-11-20 DIAGNOSIS — I130 Hypertensive heart and chronic kidney disease with heart failure and stage 1 through stage 4 chronic kidney disease, or unspecified chronic kidney disease: Secondary | ICD-10-CM | POA: Diagnosis present

## 2018-11-20 DIAGNOSIS — M549 Dorsalgia, unspecified: Secondary | ICD-10-CM | POA: Diagnosis not present

## 2018-11-20 DIAGNOSIS — R29810 Facial weakness: Secondary | ICD-10-CM | POA: Diagnosis present

## 2018-11-20 DIAGNOSIS — J9811 Atelectasis: Secondary | ICD-10-CM | POA: Diagnosis not present

## 2018-11-20 DIAGNOSIS — N184 Chronic kidney disease, stage 4 (severe): Secondary | ICD-10-CM | POA: Diagnosis present

## 2018-11-20 DIAGNOSIS — I2510 Atherosclerotic heart disease of native coronary artery without angina pectoris: Secondary | ICD-10-CM | POA: Diagnosis present

## 2018-11-20 DIAGNOSIS — S22011A Stable burst fracture of first thoracic vertebra, initial encounter for closed fracture: Secondary | ICD-10-CM | POA: Diagnosis not present

## 2018-11-20 DIAGNOSIS — I480 Paroxysmal atrial fibrillation: Secondary | ICD-10-CM | POA: Diagnosis present

## 2018-11-20 DIAGNOSIS — M5489 Other dorsalgia: Secondary | ICD-10-CM | POA: Diagnosis not present

## 2018-11-20 DIAGNOSIS — S32010A Wedge compression fracture of first lumbar vertebra, initial encounter for closed fracture: Secondary | ICD-10-CM | POA: Diagnosis present

## 2018-11-20 DIAGNOSIS — Z515 Encounter for palliative care: Secondary | ICD-10-CM

## 2018-11-20 DIAGNOSIS — I252 Old myocardial infarction: Secondary | ICD-10-CM | POA: Diagnosis not present

## 2018-11-20 DIAGNOSIS — I5030 Unspecified diastolic (congestive) heart failure: Secondary | ICD-10-CM | POA: Diagnosis not present

## 2018-11-20 DIAGNOSIS — M546 Pain in thoracic spine: Secondary | ICD-10-CM | POA: Diagnosis not present

## 2018-11-20 DIAGNOSIS — A419 Sepsis, unspecified organism: Secondary | ICD-10-CM

## 2018-11-20 DIAGNOSIS — K5720 Diverticulitis of large intestine with perforation and abscess without bleeding: Secondary | ICD-10-CM | POA: Diagnosis not present

## 2018-11-20 DIAGNOSIS — K651 Peritoneal abscess: Secondary | ICD-10-CM | POA: Diagnosis not present

## 2018-11-20 DIAGNOSIS — M4802 Spinal stenosis, cervical region: Secondary | ICD-10-CM | POA: Diagnosis present

## 2018-11-20 DIAGNOSIS — Z96651 Presence of right artificial knee joint: Secondary | ICD-10-CM | POA: Diagnosis present

## 2018-11-20 DIAGNOSIS — M8088XD Other osteoporosis with current pathological fracture, vertebra(e), subsequent encounter for fracture with routine healing: Secondary | ICD-10-CM | POA: Diagnosis not present

## 2018-11-20 DIAGNOSIS — Z8612 Personal history of poliomyelitis: Secondary | ICD-10-CM

## 2018-11-20 DIAGNOSIS — K5730 Diverticulosis of large intestine without perforation or abscess without bleeding: Secondary | ICD-10-CM | POA: Diagnosis not present

## 2018-11-20 DIAGNOSIS — Z79899 Other long term (current) drug therapy: Secondary | ICD-10-CM

## 2018-11-20 DIAGNOSIS — Z20828 Contact with and (suspected) exposure to other viral communicable diseases: Secondary | ICD-10-CM | POA: Diagnosis present

## 2018-11-20 DIAGNOSIS — S22080K Wedge compression fracture of T11-T12 vertebra, subsequent encounter for fracture with nonunion: Secondary | ICD-10-CM | POA: Diagnosis not present

## 2018-11-20 DIAGNOSIS — J168 Pneumonia due to other specified infectious organisms: Secondary | ICD-10-CM | POA: Diagnosis not present

## 2018-11-20 DIAGNOSIS — B952 Enterococcus as the cause of diseases classified elsewhere: Secondary | ICD-10-CM | POA: Diagnosis present

## 2018-11-20 DIAGNOSIS — M545 Low back pain: Secondary | ICD-10-CM | POA: Diagnosis not present

## 2018-11-20 DIAGNOSIS — R0902 Hypoxemia: Secondary | ICD-10-CM | POA: Diagnosis not present

## 2018-11-20 DIAGNOSIS — N179 Acute kidney failure, unspecified: Secondary | ICD-10-CM | POA: Diagnosis present

## 2018-11-20 DIAGNOSIS — I5032 Chronic diastolic (congestive) heart failure: Secondary | ICD-10-CM | POA: Diagnosis present

## 2018-11-20 DIAGNOSIS — S22080A Wedge compression fracture of T11-T12 vertebra, initial encounter for closed fracture: Secondary | ICD-10-CM

## 2018-11-20 DIAGNOSIS — N281 Cyst of kidney, acquired: Secondary | ICD-10-CM | POA: Diagnosis not present

## 2018-11-20 DIAGNOSIS — Z881 Allergy status to other antibiotic agents status: Secondary | ICD-10-CM

## 2018-11-20 DIAGNOSIS — E785 Hyperlipidemia, unspecified: Secondary | ICD-10-CM | POA: Diagnosis present

## 2018-11-20 DIAGNOSIS — Z85828 Personal history of other malignant neoplasm of skin: Secondary | ICD-10-CM

## 2018-11-20 DIAGNOSIS — R32 Unspecified urinary incontinence: Secondary | ICD-10-CM | POA: Diagnosis not present

## 2018-11-20 DIAGNOSIS — Z91048 Other nonmedicinal substance allergy status: Secondary | ICD-10-CM

## 2018-11-20 DIAGNOSIS — M542 Cervicalgia: Secondary | ICD-10-CM | POA: Diagnosis not present

## 2018-11-20 DIAGNOSIS — J9601 Acute respiratory failure with hypoxia: Secondary | ICD-10-CM | POA: Diagnosis present

## 2018-11-20 DIAGNOSIS — N189 Chronic kidney disease, unspecified: Secondary | ICD-10-CM | POA: Diagnosis present

## 2018-11-20 DIAGNOSIS — J189 Pneumonia, unspecified organism: Secondary | ICD-10-CM | POA: Diagnosis present

## 2018-11-20 DIAGNOSIS — S22080S Wedge compression fracture of T11-T12 vertebra, sequela: Secondary | ICD-10-CM | POA: Diagnosis not present

## 2018-11-20 DIAGNOSIS — Z888 Allergy status to other drugs, medicaments and biological substances status: Secondary | ICD-10-CM

## 2018-11-20 DIAGNOSIS — R52 Pain, unspecified: Secondary | ICD-10-CM | POA: Diagnosis not present

## 2018-11-20 DIAGNOSIS — J9600 Acute respiratory failure, unspecified whether with hypoxia or hypercapnia: Secondary | ICD-10-CM | POA: Diagnosis not present

## 2018-11-20 DIAGNOSIS — J181 Lobar pneumonia, unspecified organism: Secondary | ICD-10-CM | POA: Diagnosis not present

## 2018-11-20 DIAGNOSIS — Z9981 Dependence on supplemental oxygen: Secondary | ICD-10-CM | POA: Diagnosis not present

## 2018-11-20 DIAGNOSIS — Z22322 Carrier or suspected carrier of Methicillin resistant Staphylococcus aureus: Secondary | ICD-10-CM

## 2018-11-20 DIAGNOSIS — K5780 Diverticulitis of intestine, part unspecified, with perforation and abscess without bleeding: Secondary | ICD-10-CM | POA: Diagnosis not present

## 2018-11-20 DIAGNOSIS — R918 Other nonspecific abnormal finding of lung field: Secondary | ICD-10-CM | POA: Diagnosis not present

## 2018-11-20 DIAGNOSIS — Z91040 Latex allergy status: Secondary | ICD-10-CM

## 2018-11-20 DIAGNOSIS — R159 Full incontinence of feces: Secondary | ICD-10-CM | POA: Diagnosis not present

## 2018-11-20 LAB — HEPATIC FUNCTION PANEL
ALT: 18 U/L (ref 0–44)
AST: 25 U/L (ref 15–41)
Albumin: 2.2 g/dL — ABNORMAL LOW (ref 3.5–5.0)
Alkaline Phosphatase: 101 U/L (ref 38–126)
Bilirubin, Direct: 0.4 mg/dL — ABNORMAL HIGH (ref 0.0–0.2)
Indirect Bilirubin: 0.8 mg/dL (ref 0.3–0.9)
Total Bilirubin: 1.2 mg/dL (ref 0.3–1.2)
Total Protein: 5.8 g/dL — ABNORMAL LOW (ref 6.5–8.1)

## 2018-11-20 LAB — BASIC METABOLIC PANEL
Anion gap: 11 (ref 5–15)
BUN: 32 mg/dL — ABNORMAL HIGH (ref 8–23)
CO2: 27 mmol/L (ref 22–32)
Calcium: 8.4 mg/dL — ABNORMAL LOW (ref 8.9–10.3)
Chloride: 99 mmol/L (ref 98–111)
Creatinine, Ser: 2.09 mg/dL — ABNORMAL HIGH (ref 0.44–1.00)
GFR calc Af Amer: 24 mL/min — ABNORMAL LOW (ref >60)
GFR calc non Af Amer: 21 mL/min — ABNORMAL LOW (ref >60)
Glucose, Bld: 66 mg/dL — ABNORMAL LOW (ref 70–99)
Potassium: 4.8 mmol/L (ref 3.5–5.1)
Sodium: 137 mmol/L (ref 135–145)

## 2018-11-20 LAB — CBC WITH DIFFERENTIAL/PLATELET
Abs Immature Granulocytes: 0.08 10*3/uL — ABNORMAL HIGH (ref 0.00–0.07)
Basophils Absolute: 0 10*3/uL (ref 0.0–0.1)
Basophils Relative: 0 %
Eosinophils Absolute: 0.2 10*3/uL (ref 0.0–0.5)
Eosinophils Relative: 1 %
HCT: 45.1 % (ref 36.0–46.0)
Hemoglobin: 13.4 g/dL (ref 12.0–15.0)
Immature Granulocytes: 1 %
Lymphocytes Relative: 18 %
Lymphs Abs: 2.5 10*3/uL (ref 0.7–4.0)
MCH: 28.3 pg (ref 26.0–34.0)
MCHC: 29.7 g/dL — ABNORMAL LOW (ref 30.0–36.0)
MCV: 95.3 fL (ref 80.0–100.0)
Monocytes Absolute: 1.4 10*3/uL — ABNORMAL HIGH (ref 0.1–1.0)
Monocytes Relative: 10 %
Neutro Abs: 9.8 10*3/uL — ABNORMAL HIGH (ref 1.7–7.7)
Neutrophils Relative %: 70 %
Platelets: 265 10*3/uL (ref 150–400)
RBC: 4.73 MIL/uL (ref 3.87–5.11)
RDW: 16.4 % — ABNORMAL HIGH (ref 11.5–15.5)
WBC: 14 10*3/uL — ABNORMAL HIGH (ref 4.0–10.5)
nRBC: 0 % (ref 0.0–0.2)

## 2018-11-20 LAB — LACTIC ACID, PLASMA: Lactic Acid, Venous: 1.1 mmol/L (ref 0.5–1.9)

## 2018-11-20 LAB — TROPONIN I (HIGH SENSITIVITY): Troponin I (High Sensitivity): 36 ng/L — ABNORMAL HIGH (ref <18)

## 2018-11-20 LAB — BRAIN NATRIURETIC PEPTIDE: B Natriuretic Peptide: 385.1 pg/mL — ABNORMAL HIGH (ref 0.0–100.0)

## 2018-11-20 MED ORDER — SODIUM CHLORIDE 0.9 % IV SOLN
500.0000 mg | Freq: Once | INTRAVENOUS | Status: AC
  Administered 2018-11-20: 500 mg via INTRAVENOUS
  Filled 2018-11-20: qty 500

## 2018-11-20 MED ORDER — SODIUM CHLORIDE 0.9 % IV BOLUS
1000.0000 mL | Freq: Once | INTRAVENOUS | Status: AC
  Administered 2018-11-20: 1000 mL via INTRAVENOUS

## 2018-11-20 MED ORDER — SODIUM CHLORIDE 0.9 % IV SOLN
1.0000 g | Freq: Once | INTRAVENOUS | Status: AC
  Administered 2018-11-20: 1 g via INTRAVENOUS
  Filled 2018-11-20: qty 10

## 2018-11-20 NOTE — ED Provider Notes (Signed)
  Face-to-face evaluation   History: She presents for evaluation of neck and back pain which she describes as "spasm."  States the pain is been present for 2 weeks.  Note in chart indicates that she was sent here "for scans."  Patient cannot give any additional history or state why she is having chronic pain for which she takes for it.  Physical exam: Alert elderly female.  She is talkative and responsive.  Hands have normal grip strength bilaterally.  Both feet move normally.  Medical screening examination/treatment/procedure(s) were conducted as a shared visit with non-physician practitioner(s) and myself.  I personally evaluated the patient during the encounter    Daleen Bo, MD 11/23/18 1414

## 2018-11-20 NOTE — ED Notes (Signed)
[place] pts son, updates

## 2018-11-20 NOTE — ED Notes (Signed)
Patient transported to CT 

## 2018-11-20 NOTE — ED Provider Notes (Signed)
Medical screening examination/treatment/procedure(s) were conducted as a shared visit with non-physician practitioner(s) and myself.  I personally evaluated the patient during the encounter. Briefly, the patient is a 85 y.o. female with history of CAD, heart failure, reflux, A. fib on Eliquis, aortic stenosis who presents to the ED with low back pain.  Patient found at outpatient office to be hypoxic.  Patient appears to have fever.  Patient was originally seen by my physician assistant and overall patient had CT scan and chest x-ray done.  Chest x-ray shows infiltrate and in the setting of fever and hypoxia will expand work-up to include sepsis work-up. Possible volume issue. Patient empirically started on IV antibiotics.  Images of her back did not show any acute findings except for mild progression of her T12 compression fracture.  Patient is on blood thinner.  Doubt PE.  Patient does not have any red flag signs such as saddle anesthesia, urinary or bowel incontinence. Doubt cauda equina. Patient does not typically wear oxygen.  Denies any lung disease.  No known exposure to coronavirus. Patient has overall unremarkable exam.  Fairly clear breath sounds.  Patient has leukocytosis of 14.  Otherwise normal lactic acid.  Creatinine is at baseline.  Coronavirus test is pending.  Patient admitted to hospital service for sepsis likely in the setting of pneumonia.  Urinalysis is also pending at time of handoff.  Patient with respiratory failure that is new that is improved on 2 L of oxygen.  Possibly also component of heart failure as well as BNP and troponin are elevated.  EKG shows sinus rhythm.  No ischemic changes.  Doubt ACS.   EKG Interpretation  Date/Time:  Friday November 20 2018 22:35:27 EDT Ventricular Rate:  68 PR Interval:    QRS Duration: 134 QT Interval:  451 QTC Calculation: 480 R Axis:   -58 Text Interpretation:  Sinus rhythm Nonspecific IVCD with LAD Left ventricular hypertrophy Confirmed by  Lennice Sites [phone]) on 11/20/2018 11:32:40 PM        .Critical Care Performed by: Lennice Sites, DO Authorized by: Lennice Sites, DO   Critical care provider statement:    Critical care time (minutes):  40   Critical care time was exclusive of:  Separately billable procedures and treating other patients and teaching time   Critical care was necessary to treat or prevent imminent or life-threatening deterioration of the following conditions:  Sepsis and respiratory failure   Critical care was time spent personally by me on the following activities:  Blood draw for specimens, development of treatment plan with patient or surrogate, discussions with primary provider, evaluation of patient's response to treatment, examination of patient, obtaining history from patient or surrogate, ordering and performing treatments and interventions, ordering and review of laboratory studies, ordering and review of radiographic studies, pulse oximetry, re-evaluation of patient's condition and review of old charts      Lennice Sites, DO 11/20/18 2351

## 2018-11-20 NOTE — ED Triage Notes (Addendum)
Pt brought in by [facility] from home for chronic back pain. Per EMS pt has degenerative disc disease, pt was told by PCP to come to ED for updated scans of spine. Pt hasx2 fractured vertebrae at this time, pt states she is unaware the cause of the fractures. Denies recent falls or trauma to her back. Per EMS pt is nonambulatory, pt states she walks with a walker. Pt denies SOB/fever/cough/chills/sick contacts. Per EMS pt O2 on RA 86%, after being placed on 3L 98%.

## 2018-11-20 NOTE — ED Notes (Signed)
Nurse starting IV and drawing labs. 

## 2018-11-20 NOTE — ED Provider Notes (Signed)
[facility] EMERGENCY DEPARTMENT Provider Note   CSN: [id number] Arrival date & time: 11/20/18  1821    History   Chief Complaint Chief Complaint  Patient presents with   Back Pain    HPI Heather Benson is a 85 y.o. female with PMHx CAD, CHF, dilated cardiomyopathy, GERD, HTN, PAF on eliquis, aortic stenosis who presents to the ED today complaining of acute on chronic neck and low back pain x "a couple of months." Pt reports that she has a hx of vertebral fractures and she is following with Dr. Rolena Infante. Per triage note pt was sent here for updated "scans" of spine. No new injuries to the back or neck; pt states the pain has just been worsened over the past couple of months and her medication isn't working anymore. Pt cannot say what kind of medication she is on. She states she has not followed up with Dr. Rolena Infante regarding this. Denies fever, chills, saddle anesthesia, urinary or bowel incontinence, urinary retention, or any other associated symptoms.   While EMS was at pt's home she was found to be hypoxic at 86% on RA. Pt states she does not typically wear oxygen at home. She was placed on 3L which improved to 98%. Pt denies shortness of breath, cough, chest pain, leg swelling, abdominal distention. She has a hx of CHF; states she has been compliant with her Lasix as well as her Eliquis. No recent prolonged travel or immobilization. No hx DVT or PE. No hemoptysis. No estrogen therapy.        Past Medical History:  Diagnosis Date   Aortic atherosclerosis ([place]) 12/25/2016   Aortic regurgitation    a. mild-mod by echo 01/2018.   Aortic stenosis, mild 12/14/2015   Arthritis 08-28-11   right knee pain-surgery planned   CAD (coronary artery disease)    a. by cath 2012. b. Elev troponin 01/2018 -> cath not pursued due to renal dysfunction.   Cancer [facility]) 08-28-11   Melonoma-cheek(many Yrs ago) , recent bx. left  face lesion, past skin cancer lesions   CAP (community  acquired pneumonia) 12/13/2015   Chronic combined systolic and diastolic heart failure ([place])    prob ischemic CM // admx with NSTEMI in 9/19 in setting of pneumonia // Echo 9/19: EF 35-40 // LHC avoided due to CKD //    Complication of anesthesia 08-28-11   in past arrythmia-  cardiology has seen in past   Dilated cardiomyopathy ([place]) 03/20/2018   admx with NSTEMI in setting of prob pneumonia in 9/19 >> prob ischemic CM // Echo 9/19:  EF 35-40, anteroseptal, anterior, inferior, apical hypokinesis consistent with ischemia in the distribution of the LAD, grade 1 diastolic dysfunction, mild aortic stenosis (mean 7, peak 13), mild aortic insufficiency, moderate MAC, mild to moderate MR, moderate to severe LAE, mild RAE, moderate TR, PASP 59 //    GERD (gastroesophageal reflux disease) 08-28-11   tx. omeprazole   Hypercholesterolemia    Hypertension 08-28-11   tx. meds   IBS (irritable bowel syndrome)    Lymphedema    Mild mitral regurgitation    Neuromuscular disorder ([place]) 08-28-11   hx. Polio age 89-slight residual affects legs   Osteoarthritis    back and left knee   Osteoporosis    PAF (paroxysmal atrial fibrillation) ([place])    Pneumonia 12/2015   hospital x 3 days   PONV (postoperative nausea and vomiting)    Renal cyst, right    Sarcoidosis  SVT (supraventricular tachycardia) ([facility])    Swelling of extremity 08-28-11   bilateral lower extremities- mid calf down    Patient Active Problem List   Diagnosis Date Noted   Chronic kidney disease-mineral and bone disorder 10/20/2018   Weight loss 10/20/2018   Osteoporosis 10/20/2018   Dilated cardiomyopathy ([place]) 03/20/2018   Non-ST elevation (NSTEMI) myocardial infarction ([place])    Sepsis ([place]) 01/26/2018   Closed compression fracture of body of L1 vertebra ([place]) 10/22/2017   History of herpes zoster 10/15/2017   Kidney disease 09/05/2017   Diverticulitis of colon with hemorrhage 12/25/2016   Acute lower  UTI 12/25/2016   Aortic atherosclerosis ([place]) 12/25/2016   Chronic combined systolic and diastolic heart failure ([place])    CAP (community acquired pneumonia) 12/13/2015   Elevated troponin 12/13/2015   Anemia in chronic renal disease 12/13/2015   Hyponatremia 12/13/2015   DOE (dyspnea on exertion) 03/06/2015   Bradycardia 08/23/2014   Essential hypertension 08/23/2014   Lightheadedness 08/23/2014   Fatigue 06/22/2014   PAF (paroxysmal atrial fibrillation) ([place]) 01/27/2014   CKD (chronic kidney disease), stage IV ([place]) 01/27/2014   Swelling of limb 04/01/2012   Edema leg 04/01/2012    Past Surgical History:  Procedure Laterality Date   ABDOMINAL HYSTERECTOMY  08-28-11   Partial-vaginal approach   BREAST REDUCTION SURGERY  1997   BUNIONECTOMY  08-28-11   right   CARDIOVERSION N/A 08/05/2014   Procedure: CARDIOVERSION;  Surgeon: Larey Dresser, MD;  Location: [facility];  Service: Cardiovascular;  Laterality: N/A;   CATARACT EXTRACTION, BILATERAL  08-28-11   bilateral   COLONOSCOPY WITH PROPOFOL N/A 12/24/2016   Procedure: COLONOSCOPY WITH PROPOFOL;  Surgeon: Wonda Horner, MD;  Location: [facility] ENDOSCOPY;  Service: Endoscopy;  Laterality: N/A;   EYE SURGERY     Cataract   IR VERTEBROPLASTY EA ADDL (T&LS) BX INC UNI/BIL INC INJECT/IMAGING  10/22/2017   IR VERTEBROPLASTY LUMBAR BX INC UNI/BIL INC/INJECT/IMAGING  10/22/2017   IR VERTEBROPLASTY LUMBAR BX INC UNI/BIL INC/INJECT/IMAGING  02/02/2018   JOINT REPLACEMENT  Oct. 1, 2012   Right Knee   KNEE ARTHROSCOPY  09/06/2011   Procedure: ARTHROSCOPY KNEE;  Surgeon: Gearlean Alf, MD;  Location: [facility];  Service: Orthopedics;  Laterality: Right;  with Synovectomy     OB History   No obstetric history on file.      Home Medications    Prior to Admission medications   Medication Sig Start Date End Date Taking? Authorizing Provider  acetaminophen (TYLENOL) 500 MG tablet Take 1,000 mg by mouth every 8 (eight)  hours as needed (for pain).    Yes [provider]  amiodarone (PACERONE) 200 MG tablet TAKE 1 TABLET BY MOUTH EVERY DAY Patient taking differently: Take 200 mg by mouth daily.  09/21/18  Yes Jettie Booze, MD  apixaban (ELIQUIS) 2.5 MG TABS tablet Take 1 tablet (2.5 mg total) by mouth 2 (two) times daily. PLEASE START ON 02/03/18 AT 9PM 02/03/18  Yes Bonnielee Haff, MD  atorvastatin (LIPITOR) 10 MG tablet Take 1 tablet (10 mg total) by mouth daily. 09/08/17  Yes Jettie Booze, MD  carvedilol (COREG) 3.125 MG tablet Take 1 tablet (3.125 mg total) by mouth 2 (two) times daily. 06/30/18 11/20/18 Yes Dunn, Dayna N, PA-C  furosemide (LASIX) 20 MG tablet Take 1 tablet (20 mg total) by mouth daily. 02/03/18  Yes Bonnielee Haff, MD  HYDROcodone-acetaminophen (NORCO/VICODIN) 5-325 MG tablet Take 1 tablet by mouth 2 (two) times daily as needed for  pain.   Yes [provider]  latanoprost (XALATAN) 0.005 % ophthalmic solution Place 1 drop into both eyes at bedtime. 08/11/14  Yes [provider]  mirtazapine (REMERON) 15 MG tablet Take 15 mg by mouth at bedtime.   Yes [provider]  nitroGLYCERIN (NITROSTAT) 0.4 MG SL tablet Place 1 tablet (0.4 mg total) under the tongue every 5 (five) minutes as needed for chest pain. 03/20/18 11/20/18 Yes Weaver, Scott T, PA-C  polyethylene glycol (MIRALAX / GLYCOLAX) packet Take 17 g by mouth daily as needed. 02/03/18  Yes Bonnielee Haff, MD  potassium chloride (K-DUR) 10 MEQ tablet Take 10 mEq by mouth daily.   Yes [provider]  predniSONE (DELTASONE) 10 MG tablet Take 10 mg by mouth daily with breakfast.   Yes [provider]  Respiratory Therapy Supplies (FLUTTER) DEVI Use as directed. 02/06/16   Marshell Garfinkel, MD    Family History Family History  Problem Relation Age of Onset   Heart disease Mother        Varicose Veins   Hyperlipidemia Mother    Hypertension Mother    Colon cancer Mother     Heart disease Father        Heart Disease before age 46   Hypertension Father    Emphysema Father    Breast cancer Sister    Stroke Maternal Grandmother    Heart attack Brother     Social History Social History   Tobacco Use   Smoking status: Never Smoker   Smokeless tobacco: Never Used  Substance Use Topics   Alcohol use: No    Alcohol/week: 0.0 standard drinks   Drug use: No     Allergies   Augmentin [amoxicillin-pot clavulanate], Boniva [ibandronic acid], Codeine, Hydromorphone, Latex, Lisinopril, Amoxicillin, Clavulanic acid, and Adhesive [tape]   Review of Systems Review of Systems  Constitutional: Negative for chills and fever.  HENT: Negative for congestion.   Eyes: Negative for visual disturbance.  Respiratory: Negative for cough and shortness of breath.   Cardiovascular: Negative for chest pain and leg swelling.  Gastrointestinal: Negative for abdominal pain, constipation, diarrhea, nausea and vomiting.  Genitourinary: Negative for difficulty urinating and frequency.  Musculoskeletal: Positive for back pain and neck pain.  Skin: Negative for rash and wound.  Neurological: Negative for headaches.     Physical Exam Updated Vital Signs BP (!) 146/68    Pulse 68    Temp 100.3 F (37.9 C) (Oral)    Resp 14    Ht _0  (1.676 m)    Wt 64.4 kg    SpO2 (!) 87%    BMI 22.92 kg/m   Physical Exam Vitals signs and nursing note reviewed.  Constitutional:      Appearance: She is not ill-appearing.  HENT:     Head: Normocephalic and atraumatic.  Eyes:     Conjunctiva/sclera: Conjunctivae normal.  Neck:     Musculoskeletal: Neck supple.  Cardiovascular:     Rate and Rhythm: Normal rate and regular rhythm.  Pulmonary:     Effort: Pulmonary effort is normal.     Breath sounds: Normal breath sounds.  Abdominal:     Palpations: Abdomen is soft.     Tenderness: There is no abdominal tenderness.  Musculoskeletal:     Comments: C, T, and L midline spinal  tenderness on exam. No paraspinal tenderness. ROM intact. Moves all extremities. Strength and sensation intact throughout. 2+ peripheral pulses.   Skin:    General: Skin is warm  and dry.  Neurological:     Mental Status: She is alert.      ED Treatments / Results  Labs (all labs ordered are listed, but only abnormal results are displayed) Labs Reviewed  SARS CORONAVIRUS 2 ([facility])  CULTURE, BLOOD (ROUTINE X 2)  CULTURE, BLOOD (ROUTINE X 2)  URINE CULTURE  BASIC METABOLIC PANEL  CBC WITH DIFFERENTIAL/PLATELET  BRAIN NATRIURETIC PEPTIDE  URINALYSIS, ROUTINE W REFLEX MICROSCOPIC  LACTIC ACID, PLASMA  LACTIC ACID, PLASMA  HEPATIC FUNCTION PANEL  TROPONIN I (HIGH SENSITIVITY)    EKG None  Radiology Ct Cervical Spine Wo Contrast  Result Date: 11/20/2018 CLINICAL DATA:  Neck pain and chronic back pain. EXAM: CT CERVICAL, THORACIC, AND LUMBAR SPINE WITHOUT CONTRAST TECHNIQUE: Multidetector CT imaging of the cervical, thoracic and lumbar spine was performed without intravenous contrast. Multiplanar CT image reconstructions were also generated. COMPARISON:  Chest CT January 26, 2018, CT of the lumbosacral spine December 12, 2017, CT of the abdomen December 24, 2016 FINDINGS: CT CERVICAL SPINE FINDINGS Alignment: Slightly exaggerated cervical lordosis. Skull base and vertebrae: No acute fracture. No primary bone lesion or focal pathologic process. Osteopenia. Soft tissues and spinal canal: No prevertebral fluid or swelling. No visible canal hematoma. Disc levels: Mild multilevel osteoarthritic changes. Mild to moderate posterior facet arthropathy. Mild osseous neural foraminal narrowing of bilateral C3-C4, and right C4-C5. CT THORACIC SPINE FINDINGS Alignment: Exaggerated thoracic kyphosis. Vertebrae: Mild progression of the moderate compression fracture of T12 vertebral body with approximately 70% height loss centrally, with minimal retropulsion to  the spinal canal. Paraspinal and other soft tissues: Negative. Disc levels: Diffuse spondylosis of the thoracic spine. Stigmata of diffuse idiopathic skeletal hyperostosis of the thoracic spine. Chest: Bilateral lung dependent atelectasis. Enlarged heart. Bulky annular calcifications of the mitral and aortic valves. Calcific atherosclerotic disease of the coronary arteries. CT LUMBAR SPINE FINDINGS Segmentation: 5 lumbar type vertebrae. Alignment: Exaggerated lumbar lordosis. Stable 9 mm retropulsion associated with chronic L1 burst fracture. Vertebrae: Prior vertebroplasty of L1, L2, L3 and L4. No acute fractures are seen. Paraspinal and other soft tissues: The paraspinal soft tissues are normal. Tortuosity of the abdominal aorta with a saccular dilation of the proximal infrarenal abdominal aorta measuring 2.2 cm and a second saccular dilation of the more distal infrarenal aorta measuring 2.4 cm. Disc levels: Mild-to-moderate multilevel osteoarthritic changes with disc osteophyte complexes and posterior facet arthropathy. L1-L2, mild spinal stenosis posterior to L1 vertebral body due to retropulsion, stable. L2-L3, mild disc bulge and facet arthropathy. L3-L4, broad-based disc bulge with minimal spinal canal stenosis and bilateral neural foraminal stenosis. L4-L5, mild broad-based disc bulge with mild bilateral neural foraminal stenosis. L5-S1, mild right neural foraminal stenosis. IMPRESSION: 1. Osteopenia.  No evidence of acute traumatic injury to the spine. 2. Mild progression of the compression fracture of T12 vertebral body with approximately 70% height loss centrally, and mild retropulsion into the spinal canal. 3. Prior vertebroplasty of L1, L2, L3 and L4. Stable 9 mm retropulsion associated with the chronic L1 burst fracture. 4. Mild bilateral neural foraminal stenosis at C3-C4, C4-C5, and L3-L4, L4-L5 and L5-S1 due to osteoarthritic changes. 5. Enlarged heart with extensive annular calcifications of the  mitral and aortic valves. Calcific atherosclerotic disease of the coronary arteries and the aorta. 6. Tortuosity of the abdominal aorta with a saccular dilation of the proximal infrarenal abdominal aorta measuring 2.2 cm and a second saccular dilation of the more distal infrarenal aorta measuring 2.4 cm. Electronically  Signed   By: Fidela Salisbury M.D.   On: 11/20/2018 20:36   Ct Thoracic Spine Wo Contrast  Result Date: 11/20/2018 CLINICAL DATA:  Neck pain and chronic back pain. EXAM: CT CERVICAL, THORACIC, AND LUMBAR SPINE WITHOUT CONTRAST TECHNIQUE: Multidetector CT imaging of the cervical, thoracic and lumbar spine was performed without intravenous contrast. Multiplanar CT image reconstructions were also generated. COMPARISON:  Chest CT January 26, 2018, CT of the lumbosacral spine December 12, 2017, CT of the abdomen December 24, 2016 FINDINGS: CT CERVICAL SPINE FINDINGS Alignment: Slightly exaggerated cervical lordosis. Skull base and vertebrae: No acute fracture. No primary bone lesion or focal pathologic process. Osteopenia. Soft tissues and spinal canal: No prevertebral fluid or swelling. No visible canal hematoma. Disc levels: Mild multilevel osteoarthritic changes. Mild to moderate posterior facet arthropathy. Mild osseous neural foraminal narrowing of bilateral C3-C4, and right C4-C5. CT THORACIC SPINE FINDINGS Alignment: Exaggerated thoracic kyphosis. Vertebrae: Mild progression of the moderate compression fracture of T12 vertebral body with approximately 70% height loss centrally, with minimal retropulsion to the spinal canal. Paraspinal and other soft tissues: Negative. Disc levels: Diffuse spondylosis of the thoracic spine. Stigmata of diffuse idiopathic skeletal hyperostosis of the thoracic spine. Chest: Bilateral lung dependent atelectasis. Enlarged heart. Bulky annular calcifications of the mitral and aortic valves. Calcific atherosclerotic disease of the coronary arteries. CT LUMBAR SPINE  FINDINGS Segmentation: 5 lumbar type vertebrae. Alignment: Exaggerated lumbar lordosis. Stable 9 mm retropulsion associated with chronic L1 burst fracture. Vertebrae: Prior vertebroplasty of L1, L2, L3 and L4. No acute fractures are seen. Paraspinal and other soft tissues: The paraspinal soft tissues are normal. Tortuosity of the abdominal aorta with a saccular dilation of the proximal infrarenal abdominal aorta measuring 2.2 cm and a second saccular dilation of the more distal infrarenal aorta measuring 2.4 cm. Disc levels: Mild-to-moderate multilevel osteoarthritic changes with disc osteophyte complexes and posterior facet arthropathy. L1-L2, mild spinal stenosis posterior to L1 vertebral body due to retropulsion, stable. L2-L3, mild disc bulge and facet arthropathy. L3-L4, broad-based disc bulge with minimal spinal canal stenosis and bilateral neural foraminal stenosis. L4-L5, mild broad-based disc bulge with mild bilateral neural foraminal stenosis. L5-S1, mild right neural foraminal stenosis. IMPRESSION: 1. Osteopenia.  No evidence of acute traumatic injury to the spine. 2. Mild progression of the compression fracture of T12 vertebral body with approximately 70% height loss centrally, and mild retropulsion into the spinal canal. 3. Prior vertebroplasty of L1, L2, L3 and L4. Stable 9 mm retropulsion associated with the chronic L1 burst fracture. 4. Mild bilateral neural foraminal stenosis at C3-C4, C4-C5, and L3-L4, L4-L5 and L5-S1 due to osteoarthritic changes. 5. Enlarged heart with extensive annular calcifications of the mitral and aortic valves. Calcific atherosclerotic disease of the coronary arteries and the aorta. 6. Tortuosity of the abdominal aorta with a saccular dilation of the proximal infrarenal abdominal aorta measuring 2.2 cm and a second saccular dilation of the more distal infrarenal aorta measuring 2.4 cm. Electronically Signed   By: Fidela Salisbury M.D.   On: 11/20/2018 20:36   Ct Lumbar  Spine Wo Contrast  Result Date: 11/20/2018 CLINICAL DATA:  Neck pain and chronic back pain. EXAM: CT CERVICAL, THORACIC, AND LUMBAR SPINE WITHOUT CONTRAST TECHNIQUE: Multidetector CT imaging of the cervical, thoracic and lumbar spine was performed without intravenous contrast. Multiplanar CT image reconstructions were also generated. COMPARISON:  Chest CT January 26, 2018, CT of the lumbosacral spine December 12, 2017, CT of the abdomen December 24, 2016 FINDINGS: CT CERVICAL SPINE  FINDINGS Alignment: Slightly exaggerated cervical lordosis. Skull base and vertebrae: No acute fracture. No primary bone lesion or focal pathologic process. Osteopenia. Soft tissues and spinal canal: No prevertebral fluid or swelling. No visible canal hematoma. Disc levels: Mild multilevel osteoarthritic changes. Mild to moderate posterior facet arthropathy. Mild osseous neural foraminal narrowing of bilateral C3-C4, and right C4-C5. CT THORACIC SPINE FINDINGS Alignment: Exaggerated thoracic kyphosis. Vertebrae: Mild progression of the moderate compression fracture of T12 vertebral body with approximately 70% height loss centrally, with minimal retropulsion to the spinal canal. Paraspinal and other soft tissues: Negative. Disc levels: Diffuse spondylosis of the thoracic spine. Stigmata of diffuse idiopathic skeletal hyperostosis of the thoracic spine. Chest: Bilateral lung dependent atelectasis. Enlarged heart. Bulky annular calcifications of the mitral and aortic valves. Calcific atherosclerotic disease of the coronary arteries. CT LUMBAR SPINE FINDINGS Segmentation: 5 lumbar type vertebrae. Alignment: Exaggerated lumbar lordosis. Stable 9 mm retropulsion associated with chronic L1 burst fracture. Vertebrae: Prior vertebroplasty of L1, L2, L3 and L4. No acute fractures are seen. Paraspinal and other soft tissues: The paraspinal soft tissues are normal. Tortuosity of the abdominal aorta with a saccular dilation of the proximal infrarenal  abdominal aorta measuring 2.2 cm and a second saccular dilation of the more distal infrarenal aorta measuring 2.4 cm. Disc levels: Mild-to-moderate multilevel osteoarthritic changes with disc osteophyte complexes and posterior facet arthropathy. L1-L2, mild spinal stenosis posterior to L1 vertebral body due to retropulsion, stable. L2-L3, mild disc bulge and facet arthropathy. L3-L4, broad-based disc bulge with minimal spinal canal stenosis and bilateral neural foraminal stenosis. L4-L5, mild broad-based disc bulge with mild bilateral neural foraminal stenosis. L5-S1, mild right neural foraminal stenosis. IMPRESSION: 1. Osteopenia.  No evidence of acute traumatic injury to the spine. 2. Mild progression of the compression fracture of T12 vertebral body with approximately 70% height loss centrally, and mild retropulsion into the spinal canal. 3. Prior vertebroplasty of L1, L2, L3 and L4. Stable 9 mm retropulsion associated with the chronic L1 burst fracture. 4. Mild bilateral neural foraminal stenosis at C3-C4, C4-C5, and L3-L4, L4-L5 and L5-S1 due to osteoarthritic changes. 5. Enlarged heart with extensive annular calcifications of the mitral and aortic valves. Calcific atherosclerotic disease of the coronary arteries and the aorta. 6. Tortuosity of the abdominal aorta with a saccular dilation of the proximal infrarenal abdominal aorta measuring 2.2 cm and a second saccular dilation of the more distal infrarenal aorta measuring 2.4 cm. Electronically Signed   By: Fidela Salisbury M.D.   On: 11/20/2018 20:36   Dg Chest Port 1 View  Result Date: 11/20/2018 CLINICAL DATA:  New oxygen requirement EXAM: PORTABLE CHEST 1 VIEW COMPARISON:  January 27, 2018 FINDINGS: There is elevation right hemidiaphragm. Opacity in left base could represent atelectasis or infiltrate with obscuration left hemidiaphragm. The cardiomediastinal silhouette is stable. No pneumothorax. IMPRESSION: Opacity in the left base obscuring left  hemidiaphragm could represent atelectasis or infiltrate. Recommend follow-up to resolution. No other interval changes. Electronically Signed   By: Dorise Bullion III M.D   On: 11/20/2018 21:54    Procedures Procedures (including critical care time)  Medications Ordered in ED Medications  cefTRIAXone (ROCEPHIN) 1 g in sodium chloride 0.9 % 100 mL IVPB (has no administration in time range)  azithromycin (ZITHROMAX) 500 mg in sodium chloride 0.9 % 250 mL IVPB (has no administration in time range)  sodium chloride 0.9 % bolus 1,000 mL (has no administration in time range)     Initial Impression / Assessment and Plan / ED  Course  I have reviewed the triage vital signs and the nursing notes.  Pertinent labs & imaging results that were available during my care of the patient were reviewed by me and considered in my medical decision making (see chart for details).    85 year old female presenting to the ED with unchanged back and neck pain x "months." Followed by Dr. Rolena Infante; states she is here for "updated scans." No new injuries. No focal deficits on exam; strength and sensation intact throughout. Will obtain CT C, T, and L spine as pt having midline spinal tenderness although suspect this is pt's norm. Concerning new oxygen requirement; pt found to be hypoxic with EMS; placed on 3L. Not typically on oxygen at home. Attempted to wean pt's off in the ED without success - dropped back down to 90% on RA. Initially temp 100.3 but with recheck 98.2 - not documented in chart; pt without tachycardia or tachypnea; does not currently meet SIRS criteria. Pt not complaining of any shortness of breath, chest pain, worsening leg swelling, abdominal distension. Has hx of CHF with most recent ECHO in 06/2018 with EF 55-60%. Will obtain CXR today as well as baseline bloodwork; pt will likely need to be admitted given new O2 requirement.   CXR with opacity in the left lung; rocephin and zithromax initiated. Still  awaiting remainder of labs but patient will need to be admitted at this time given age and PNA. Care signed out to Dr. Ronnald Nian at shift change.         Final Clinical Impressions(s) / ED Diagnoses   Final diagnoses:  Chronic midline back pain, unspecified back location  Pneumonia of left lower lobe due to infectious organism [facility])    ED Discharge Orders    None       Eustaquio Maize, PA-C 11/20/18 2220    Lennice Sites, DO 11/21/18 0032

## 2018-11-21 ENCOUNTER — Encounter (HOSPITAL_COMMUNITY): Payer: Self-pay | Admitting: Internal Medicine

## 2018-11-21 DIAGNOSIS — J9601 Acute respiratory failure with hypoxia: Secondary | ICD-10-CM

## 2018-11-21 DIAGNOSIS — I480 Paroxysmal atrial fibrillation: Secondary | ICD-10-CM

## 2018-11-21 DIAGNOSIS — J189 Pneumonia, unspecified organism: Secondary | ICD-10-CM

## 2018-11-21 LAB — URINALYSIS, ROUTINE W REFLEX MICROSCOPIC
Bilirubin Urine: NEGATIVE
Glucose, UA: NEGATIVE mg/dL
Ketones, ur: 5 mg/dL — AB
Nitrite: POSITIVE — AB
Protein, ur: NEGATIVE mg/dL
Specific Gravity, Urine: 1.017 (ref 1.005–1.030)
WBC, UA: >50 WBC/hpf — ABNORMAL HIGH (ref 0–5)
pH: 5 (ref 5.0–8.0)

## 2018-11-21 LAB — SARS CORONAVIRUS 2 BY RT PCR (HOSPITAL ORDER, PERFORMED IN ~~LOC~~ HOSPITAL LAB): SARS Coronavirus 2: NEGATIVE

## 2018-11-21 LAB — GLUCOSE, CAPILLARY
Glucose-Capillary: 62 mg/dL — ABNORMAL LOW (ref 70–99)
Glucose-Capillary: 94 mg/dL (ref 70–99)

## 2018-11-21 LAB — COMPREHENSIVE METABOLIC PANEL
ALT: 15 U/L (ref 0–44)
AST: 24 U/L (ref 15–41)
Albumin: 2.1 g/dL — ABNORMAL LOW (ref 3.5–5.0)
Alkaline Phosphatase: 103 U/L (ref 38–126)
Anion gap: 14 (ref 5–15)
BUN: 29 mg/dL — ABNORMAL HIGH (ref 8–23)
CO2: 25 mmol/L (ref 22–32)
Calcium: 8.2 mg/dL — ABNORMAL LOW (ref 8.9–10.3)
Chloride: 102 mmol/L (ref 98–111)
Creatinine, Ser: 2.05 mg/dL — ABNORMAL HIGH (ref 0.44–1.00)
GFR calc Af Amer: 25 mL/min — ABNORMAL LOW (ref >60)
GFR calc non Af Amer: 22 mL/min — ABNORMAL LOW (ref >60)
Glucose, Bld: 62 mg/dL — ABNORMAL LOW (ref 70–99)
Potassium: 4.8 mmol/L (ref 3.5–5.1)
Sodium: 141 mmol/L (ref 135–145)
Total Bilirubin: 1.2 mg/dL (ref 0.3–1.2)
Total Protein: 5.3 g/dL — ABNORMAL LOW (ref 6.5–8.1)

## 2018-11-21 LAB — CBC WITH DIFFERENTIAL/PLATELET
Abs Immature Granulocytes: 0.08 10*3/uL — ABNORMAL HIGH (ref 0.00–0.07)
Basophils Absolute: 0 10*3/uL (ref 0.0–0.1)
Basophils Relative: 0 %
Eosinophils Absolute: 0.1 10*3/uL (ref 0.0–0.5)
Eosinophils Relative: 1 %
HCT: 43.3 % (ref 36.0–46.0)
Hemoglobin: 12.8 g/dL (ref 12.0–15.0)
Immature Granulocytes: 1 %
Lymphocytes Relative: 15 %
Lymphs Abs: 2.1 10*3/uL (ref 0.7–4.0)
MCH: 27.9 pg (ref 26.0–34.0)
MCHC: 29.6 g/dL — ABNORMAL LOW (ref 30.0–36.0)
MCV: 94.5 fL (ref 80.0–100.0)
Monocytes Absolute: 1.1 10*3/uL — ABNORMAL HIGH (ref 0.1–1.0)
Monocytes Relative: 8 %
Neutro Abs: 10.8 10*3/uL — ABNORMAL HIGH (ref 1.7–7.7)
Neutrophils Relative %: 75 %
Platelets: 271 10*3/uL (ref 150–400)
RBC: 4.58 MIL/uL (ref 3.87–5.11)
RDW: 16.4 % — ABNORMAL HIGH (ref 11.5–15.5)
WBC: 14.2 10*3/uL — ABNORMAL HIGH (ref 4.0–10.5)
nRBC: 0 % (ref 0.0–0.2)

## 2018-11-21 LAB — APTT: aPTT: 37 seconds — ABNORMAL HIGH (ref 24–36)

## 2018-11-21 LAB — STREP PNEUMONIAE URINARY ANTIGEN: Strep Pneumo Urinary Antigen: NEGATIVE

## 2018-11-21 LAB — LACTIC ACID, PLASMA: Lactic Acid, Venous: 0.7 mmol/L (ref 0.5–1.9)

## 2018-11-21 LAB — HEPARIN LEVEL (UNFRACTIONATED): Heparin Unfractionated: 1.14 IU/mL — ABNORMAL HIGH (ref 0.30–0.70)

## 2018-11-21 LAB — TROPONIN I (HIGH SENSITIVITY): Troponin I (High Sensitivity): 38 ng/L — ABNORMAL HIGH (ref <18)

## 2018-11-21 LAB — HIV ANTIBODY (ROUTINE TESTING W REFLEX): HIV Screen 4th Generation wRfx: NONREACTIVE

## 2018-11-21 MED ORDER — LATANOPROST 0.005 % OP SOLN
1.0000 [drp] | Freq: Every day | OPHTHALMIC | Status: DC
  Administered 2018-11-21 – 2018-12-04 (×14): 1 [drp] via OPHTHALMIC
  Filled 2018-11-21: qty 2.5

## 2018-11-21 MED ORDER — SODIUM CHLORIDE 0.9 % IV SOLN
INTRAVENOUS | Status: DC | PRN
  Administered 2018-11-21: 100 mL via INTRAVENOUS
  Administered 2018-11-22 – 2018-11-23 (×2): 250 mL via INTRAVENOUS
  Administered 2018-11-25: 500 mL via INTRAVENOUS
  Administered 2018-11-26: 100 mL via INTRAVENOUS

## 2018-11-21 MED ORDER — ACETAMINOPHEN 650 MG RE SUPP: 650.0000 mg | Freq: Four times a day (QID) | RECTAL | Status: DC | PRN

## 2018-11-21 MED ORDER — ADULT MULTIVITAMIN LIQUID CH
15.0000 mL | Freq: Every day | ORAL | Status: DC
  Administered 2018-11-21 – 2018-12-03 (×13): 15 mL via ORAL
  Filled 2018-11-21 (×14): qty 15

## 2018-11-21 MED ORDER — ATORVASTATIN CALCIUM 10 MG PO TABS
10.0000 mg | ORAL_TABLET | Freq: Every day | ORAL | Status: DC
  Administered 2018-11-21 – 2018-12-03 (×13): 10 mg via ORAL
  Filled 2018-11-21 (×13): qty 1

## 2018-11-21 MED ORDER — POTASSIUM CHLORIDE CRYS ER 10 MEQ PO TBCR
10.0000 meq | EXTENDED_RELEASE_TABLET | Freq: Every day | ORAL | Status: DC
  Administered 2018-11-21 – 2018-12-03 (×13): 10 meq via ORAL
  Filled 2018-11-21 (×14): qty 1

## 2018-11-21 MED ORDER — ONDANSETRON HCL 4 MG PO TABS: 4.0000 mg | ORAL_TABLET | Freq: Four times a day (QID) | ORAL | Status: DC | PRN

## 2018-11-21 MED ORDER — ONDANSETRON HCL 4 MG/2ML IJ SOLN: 4.0000 mg | Freq: Four times a day (QID) | INTRAMUSCULAR | Status: DC | PRN

## 2018-11-21 MED ORDER — APIXABAN 2.5 MG PO TABS: 2.5000 mg | ORAL_TABLET | Freq: Two times a day (BID) | ORAL | Status: DC

## 2018-11-21 MED ORDER — CARVEDILOL 3.125 MG PO TABS
3.1250 mg | ORAL_TABLET | Freq: Two times a day (BID) | ORAL | Status: DC
  Administered 2018-11-21 – 2018-12-03 (×20): 3.125 mg via ORAL
  Filled 2018-11-21 (×26): qty 1

## 2018-11-21 MED ORDER — ACETAMINOPHEN 325 MG PO TABS
650.0000 mg | ORAL_TABLET | Freq: Four times a day (QID) | ORAL | Status: DC | PRN
  Administered 2018-11-26: 12:00:00 650 mg via ORAL
  Filled 2018-11-21: qty 2

## 2018-11-21 MED ORDER — ENSURE ENLIVE PO LIQD
237.0000 mL | Freq: Two times a day (BID) | ORAL | Status: DC
  Administered 2018-11-21 – 2018-12-01 (×18): 237 mL via ORAL

## 2018-11-21 MED ORDER — PREDNISONE 10 MG PO TABS
10.0000 mg | ORAL_TABLET | Freq: Every day | ORAL | Status: DC
  Administered 2018-11-21 – 2018-12-03 (×13): 10 mg via ORAL
  Filled 2018-11-21 (×13): qty 1

## 2018-11-21 MED ORDER — POLYETHYLENE GLYCOL 3350 17 G PO PACK: 17.0000 g | PACK | Freq: Every day | ORAL | Status: DC | PRN

## 2018-11-21 MED ORDER — ORAL CARE MOUTH RINSE
15.0000 mL | Freq: Two times a day (BID) | OROMUCOSAL | Status: DC
  Administered 2018-11-21 – 2018-12-05 (×22): 15 mL via OROMUCOSAL

## 2018-11-21 MED ORDER — SODIUM CHLORIDE 0.9 % IV SOLN
500.0000 mg | INTRAVENOUS | Status: DC
  Administered 2018-11-21 – 2018-11-23 (×3): 500 mg via INTRAVENOUS
  Filled 2018-11-21 (×4): qty 500

## 2018-11-21 MED ORDER — FUROSEMIDE 20 MG PO TABS
20.0000 mg | ORAL_TABLET | Freq: Every day | ORAL | Status: DC
  Administered 2018-11-21 – 2018-12-03 (×13): 20 mg via ORAL
  Filled 2018-11-21 (×13): qty 1

## 2018-11-21 MED ORDER — HEPARIN (PORCINE) 25000 UT/250ML-% IV SOLN
1150.0000 [IU]/h | INTRAVENOUS | Status: DC
  Administered 2018-11-21 – 2018-11-22 (×2): 1000 [IU]/h via INTRAVENOUS
  Administered 2018-11-23 – 2018-11-25 (×3): 1150 [IU]/h via INTRAVENOUS
  Filled 2018-11-21 (×5): qty 250

## 2018-11-21 MED ORDER — MIRTAZAPINE 15 MG PO TABS
15.0000 mg | ORAL_TABLET | Freq: Every day | ORAL | Status: DC
  Administered 2018-11-21 – 2018-12-03 (×13): 15 mg via ORAL
  Filled 2018-11-21 (×13): qty 1

## 2018-11-21 MED ORDER — HYDROCODONE-ACETAMINOPHEN 5-325 MG PO TABS
1.0000 | ORAL_TABLET | Freq: Two times a day (BID) | ORAL | Status: DC | PRN
  Administered 2018-11-21 – 2018-11-26 (×3): 1 via ORAL
  Filled 2018-11-21 (×3): qty 1

## 2018-11-21 MED ORDER — SODIUM CHLORIDE 0.9 % IV SOLN
2.0000 g | INTRAVENOUS | Status: DC
  Administered 2018-11-21 – 2018-11-23 (×3): 2 g via INTRAVENOUS
  Filled 2018-11-21 (×3): qty 20

## 2018-11-21 MED ORDER — AMIODARONE HCL 200 MG PO TABS
200.0000 mg | ORAL_TABLET | Freq: Every day | ORAL | Status: DC
  Administered 2018-11-21 – 2018-12-03 (×13): 200 mg via ORAL
  Filled 2018-11-21 (×13): qty 1

## 2018-11-21 MED ORDER — NITROGLYCERIN 0.4 MG SL SUBL: 0.4000 mg | SUBLINGUAL_TABLET | SUBLINGUAL | Status: DC | PRN

## 2018-11-21 NOTE — Plan of Care (Signed)

## 2018-11-21 NOTE — Progress Notes (Addendum)
Heparin was paused for couple hours due to loss of piv access. Heparin is restarted in piv placed in left [facility].  Patient was constipated with hard stool and liquid stool leaking around it. Nurse digitally removed hard stool and hemorrhoid started bleeding.   Patient is increasingly confused and refusing to eat meals. Encouraged to drink ensure.

## 2018-11-21 NOTE — H&P (Signed)
History and Physical    [name]:[id number] DOB: 24-Jul-1931 DOA: 11/20/2018  PCP: [name], MD  Patient coming from: Home.  History obtained from patient's husband and patient.  Chief Complaint: Back pain.  HPI: Heather Benson is a 85 y.o. female with history of chronic systolic heart failure paroxysmal atrial fibrillation CAD hypertension hyperlipidemia with previous vertebroplasty's has been experiencing increasing back pain over the last 2 weeks.  Patient is supposed to see orthopedic but since patient pain is worsening came to the ER.  Patient has been also noted that patient has been getting more short of breath last few days.  ED Course: In the ER patient was found to be febrile and hypoxic with temperature 100 F chest x-ray shows opacity concerning for pneumonia.  Patient had a CT C-spine L-spine and T-spine.  Shows progression of the T12 vertebral body compression fracture with some retropulsion.  Given the fever hypoxia and chest x-ray showing infiltrates patient was started on antibiotics for pneumonia.  Labs showed WBC count of 14 creatinine 2 platelets 265.  Review of Systems: As per HPI, rest all negative.   Past Medical History:  Diagnosis Date   Aortic atherosclerosis ([place]) 12/25/2016   Aortic regurgitation    a. mild-mod by echo 01/2018.   Aortic stenosis, mild 12/14/2015   Arthritis 08-28-11   right knee pain-surgery planned   CAD (coronary artery disease)    a. by cath 2012. b. Elev troponin 01/2018 -> cath not pursued due to renal dysfunction.   Cancer [facility]) 08-28-11   Melonoma-cheek(many Yrs ago) , recent bx. left  face lesion, past skin cancer lesions   CAP (community acquired pneumonia) 12/13/2015   Chronic combined systolic and diastolic heart failure ([place])    prob ischemic CM // admx with NSTEMI in 9/19 in setting of pneumonia // Echo 9/19: EF 35-40 // LHC avoided due to CKD //    Complication of anesthesia 08-28-11   in past  arrythmia-  cardiology has seen in past   Dilated cardiomyopathy ([place]) 03/20/2018   admx with NSTEMI in setting of prob pneumonia in 9/19 >> prob ischemic CM // Echo 9/19:  EF 35-40, anteroseptal, anterior, inferior, apical hypokinesis consistent with ischemia in the distribution of the LAD, grade 1 diastolic dysfunction, mild aortic stenosis (mean 7, peak 13), mild aortic insufficiency, moderate MAC, mild to moderate MR, moderate to severe LAE, mild RAE, moderate TR, PASP 59 //    GERD (gastroesophageal reflux disease) 08-28-11   tx. omeprazole   Hypercholesterolemia    Hypertension 08-28-11   tx. meds   IBS (irritable bowel syndrome)    Lymphedema    Mild mitral regurgitation    Neuromuscular disorder ([place]) 08-28-11   hx. Polio age 80-slight residual affects legs   Osteoarthritis    back and left knee   Osteoporosis    PAF (paroxysmal atrial fibrillation) ([place])    Pneumonia 12/2015   hospital x 3 days   PONV (postoperative nausea and vomiting)    Renal cyst, right    Sarcoidosis    SVT (supraventricular tachycardia) (HCC)    Swelling of extremity 08-28-11   bilateral lower extremities- mid calf down    Past Surgical History:  Procedure Laterality Date   ABDOMINAL HYSTERECTOMY  08-28-11   Partial-vaginal approach   BREAST REDUCTION SURGERY  1997   BUNIONECTOMY  08-28-11   right   CARDIOVERSION N/A 08/05/2014   Procedure: CARDIOVERSION;  Surgeon: Larey Dresser, MD;  Location: [facility]  ENDOSCOPY;  Service: Cardiovascular;  Laterality: N/A;   CATARACT EXTRACTION, BILATERAL  08-28-11   bilateral   COLONOSCOPY WITH PROPOFOL N/A 12/24/2016   Procedure: COLONOSCOPY WITH PROPOFOL;  Surgeon: Wonda Horner, MD;  Location: [facility] ENDOSCOPY;  Service: Endoscopy;  Laterality: N/A;   EYE SURGERY     Cataract   IR VERTEBROPLASTY EA ADDL (T&LS) BX INC UNI/BIL INC INJECT/IMAGING  10/22/2017   IR VERTEBROPLASTY LUMBAR BX INC UNI/BIL INC/INJECT/IMAGING  10/22/2017   IR  VERTEBROPLASTY LUMBAR BX INC UNI/BIL INC/INJECT/IMAGING  02/02/2018   JOINT REPLACEMENT  Oct. 1, 2012   Right Knee   KNEE ARTHROSCOPY  09/06/2011   Procedure: ARTHROSCOPY KNEE;  Surgeon: Gearlean Alf, MD;  Location: [facility] ORS;  Service: Orthopedics;  Laterality: Right;  with Synovectomy     reports that she has never smoked. She has never used smokeless tobacco. She reports that she does not drink alcohol or use drugs.  Allergies  Allergen Reactions   Augmentin [Amoxicillin-Pot Clavulanate] Diarrhea and Nausea Only   Boniva [Ibandronic Acid] Diarrhea   Codeine Other (See Comments)    "Spaced out feeling" and Lightheadedness   Hydromorphone Nausea And Vomiting   Latex Rash    "Burns me"   Lisinopril Cough   Amoxicillin    Clavulanic Acid    Adhesive [Tape] Itching and Rash    Family History  Problem Relation Age of Onset   Heart disease Mother        Varicose Veins   Hyperlipidemia Mother    Hypertension Mother    Colon cancer Mother    Heart disease Father        Heart Disease before age 7   Hypertension Father    Emphysema Father    Breast cancer Sister    Stroke Maternal Grandmother    Heart attack Brother     Prior to Admission medications   Medication Sig Start Date End Date Taking? Authorizing Provider  acetaminophen (TYLENOL) 500 MG tablet Take 1,000 mg by mouth every 8 (eight) hours as needed (for pain).    Yes [provider]  amiodarone (PACERONE) 200 MG tablet TAKE 1 TABLET BY MOUTH EVERY DAY Patient taking differently: Take 200 mg by mouth daily.  09/21/18  Yes Jettie Booze, MD  apixaban (ELIQUIS) 2.5 MG TABS tablet Take 1 tablet (2.5 mg total) by mouth 2 (two) times daily. PLEASE START ON 02/03/18 AT 9PM 02/03/18  Yes Bonnielee Haff, MD  atorvastatin (LIPITOR) 10 MG tablet Take 1 tablet (10 mg total) by mouth daily. 09/08/17  Yes Jettie Booze, MD  carvedilol (COREG) 3.125 MG tablet Take 1 tablet (3.125 mg total) by  mouth 2 (two) times daily. 06/30/18 11/20/18 Yes Dunn, Dayna N, PA-C  furosemide (LASIX) 20 MG tablet Take 1 tablet (20 mg total) by mouth daily. 02/03/18  Yes Bonnielee Haff, MD  HYDROcodone-acetaminophen (NORCO/VICODIN) 5-325 MG tablet Take 1 tablet by mouth 2 (two) times daily as needed for pain.   Yes [provider]  latanoprost (XALATAN) 0.005 % ophthalmic solution Place 1 drop into both eyes at bedtime. 08/11/14  Yes [provider]  mirtazapine (REMERON) 15 MG tablet Take 15 mg by mouth at bedtime.   Yes [provider]  nitroGLYCERIN (NITROSTAT) 0.4 MG SL tablet Place 1 tablet (0.4 mg total) under the tongue every 5 (five) minutes as needed for chest pain. 03/20/18 11/20/18 Yes Weaver, Scott T, PA-C  polyethylene glycol (MIRALAX / GLYCOLAX) packet Take 17 g by mouth daily  as needed. 02/03/18  Yes Bonnielee Haff, MD  potassium chloride (K-DUR) 10 MEQ tablet Take 10 mEq by mouth daily.   Yes [provider]  predniSONE (DELTASONE) 10 MG tablet Take 10 mg by mouth daily with breakfast.   Yes [provider]  Respiratory Therapy Supplies (FLUTTER) DEVI Use as directed. 02/06/16   Marshell Garfinkel, MD    Physical Exam: Constitutional: Moderately built and nourished. Vitals:   11/20/18 1825 11/20/18 1900 11/20/18 2247 11/21/18 0045  BP:  126/74 (!) 144/65 (!) 130/47  Pulse:  65 66 64  Resp:  _0 Temp:      TempSrc:      SpO2:  99% 99% 98%  Weight: 64.4 kg     Height: _1  (1.676 m)      Eyes: Anicteric no pallor. ENMT: No discharge from the ears eyes nose or mouth. Neck: No mass felt.  No neck rigidity. Respiratory: No rhonchi or crepitations. Cardiovascular: S1-S2 heard. Abdomen: Soft nontender bowel sounds present. Musculoskeletal: No edema. Skin: No rash. Neurologic: Alert awake oriented to time place and person.  Moves all extremities. Psychiatric: Appears normal per normal affect.   Labs on Admission: I have personally reviewed  following labs and imaging studies  CBC: Recent Labs  Lab 11/20/18 2220  WBC 14.0*  NEUTROABS 9.8*  HGB 13.4  HCT 45.1  MCV 95.3  PLT 275   Basic Metabolic Panel: Recent Labs  Lab 11/20/18 2220  NA 137  K 4.8  CL 99  CO2 27  GLUCOSE 66*  BUN 32*  CREATININE 2.09*  CALCIUM 8.4*   GFR: Estimated Creatinine Clearance: 18.4 mL/min (A) (by C-G formula based on SCr of 2.09 mg/dL (H)). Liver Function Tests: Recent Labs  Lab 11/20/18 2216  AST 25  ALT 18  ALKPHOS 101  BILITOT 1.2  PROT 5.8*  ALBUMIN 2.2*   No results for input(s): LIPASE, AMYLASE in the last 168 hours. No results for input(s): AMMONIA in the last 168 hours. Coagulation Profile: No results for input(s): INR, PROTIME in the last 168 hours. Cardiac Enzymes: No results for input(s): CKTOTAL, CKMB, CKMBINDEX, TROPONINI in the last 168 hours. BNP (last 3 results) No results for input(s): PROBNP in the last 8760 hours. HbA1C: No results for input(s): HGBA1C in the last 72 hours. CBG: No results for input(s): GLUCAP in the last 168 hours. Lipid Profile: No results for input(s): CHOL, HDL, LDLCALC, TRIG, CHOLHDL, LDLDIRECT in the last 72 hours. Thyroid Function Tests: No results for input(s): TSH, T4TOTAL, FREET4, T3FREE, THYROIDAB in the last 72 hours. Anemia Panel: No results for input(s): VITAMINB12, FOLATE, FERRITIN, TIBC, IRON, RETICCTPCT in the last 72 hours. Urine analysis:    Component Value Date/Time   COLORURINE YELLOW 10/16/2017 2117   APPEARANCEUR CLEAR 10/16/2017 2117   APPEARANCEUR Clear 07/17/2017 1354   LABSPEC 1.011 10/16/2017 2117   PHURINE 5.0 10/16/2017 2117   GLUCOSEU NEGATIVE 10/16/2017 2117   HGBUR MODERATE (A) 10/16/2017 2117   BILIRUBINUR NEGATIVE 10/16/2017 2117   BILIRUBINUR Negative 07/17/2017 1354   KETONESUR NEGATIVE 10/16/2017 2117   PROTEINUR NEGATIVE 10/16/2017 2117   UROBILINOGEN 0.2 02/05/2011 0915   NITRITE NEGATIVE 10/16/2017 2117   LEUKOCYTESUR NEGATIVE  10/16/2017 2117   LEUKOCYTESUR Negative 07/17/2017 1354   Sepsis Labs: _2 (procalcitonin:4,lacticidven:4) ) Recent Results (from the past 240 hour(s))  SARS Coronavirus 2 (CEPHEID- Performed in [place] hospital lab), Hosp Order     Status: None   Collection Time: 11/20/18 10:50 PM  Specimen: Nasopharyngeal Swab  Result Value Ref Range Status   SARS Coronavirus 2 NEGATIVE NEGATIVE Final    Comment: (NOTE) If result is NEGATIVE SARS-CoV-2 target nucleic acids are NOT DETECTED. The SARS-CoV-2 RNA is generally detectable in upper and lower  respiratory specimens during the acute phase of infection. The lowest  concentration of SARS-CoV-2 viral copies this assay can detect is 250  copies / mL. A negative result does not preclude SARS-CoV-2 infection  and should not be used as the sole basis for treatment or other  patient management decisions.  A negative result may occur with  improper specimen collection / handling, submission of specimen other  than nasopharyngeal swab, presence of viral mutation(s) within the  areas targeted by this assay, and inadequate number of viral copies  (<250 copies / mL). A negative result must be combined with clinical  observations, patient history, and epidemiological information. If result is POSITIVE SARS-CoV-2 target nucleic acids are DETECTED. The SARS-CoV-2 RNA is generally detectable in upper and lower  respiratory specimens dur ing the acute phase of infection.  Positive  results are indicative of active infection with SARS-CoV-2.  Clinical  correlation with patient history and other diagnostic information is  necessary to determine patient infection status.  Positive results do  not rule out bacterial infection or co-infection with other viruses. If result is PRESUMPTIVE POSTIVE SARS-CoV-2 nucleic acids MAY BE PRESENT.   A presumptive positive result was obtained on the submitted specimen  and confirmed on repeat testing.  While  2019 novel coronavirus  (SARS-CoV-2) nucleic acids may be present in the submitted sample  additional confirmatory testing may be necessary for epidemiological  and / or clinical management purposes  to differentiate between  SARS-CoV-2 and other Sarbecovirus currently known to infect humans.  If clinically indicated additional testing with an alternate test  methodology [phone]) is advised. The SARS-CoV-2 RNA is generally  detectable in upper and lower respiratory sp ecimens during the acute  phase of infection. The expected result is Negative. Fact Sheet for Patients:  StrictlyIdeas.no Fact Sheet for Healthcare Providers: BankingDealers.co.za This test is not yet approved or cleared by the [place] FDA and has been authorized for detection and/or diagnosis of SARS-CoV-2 by FDA under an Emergency Use Authorization (EUA).  This EUA will remain in effect (meaning this test can be used) for the duration of the COVID-19 declaration under Section 564(b)(1) of the Act, 21 U.S.C. section 360bbb-3(b)(1), unless the authorization is terminated or revoked sooner. Performed at [facility], [place] [address] [place], [place] [address]      Radiological Exams on Admission: Ct Cervical Spine Wo Contrast  Result Date: 11/20/2018 CLINICAL DATA:  Neck pain and chronic back pain. EXAM: CT CERVICAL, THORACIC, AND LUMBAR SPINE WITHOUT CONTRAST TECHNIQUE: Multidetector CT imaging of the cervical, thoracic and lumbar spine was performed without intravenous contrast. Multiplanar CT image reconstructions were also generated. COMPARISON:  Chest CT January 26, 2018, CT of the lumbosacral spine December 12, 2017, CT of the abdomen December 24, 2016 FINDINGS: CT CERVICAL SPINE FINDINGS Alignment: Slightly exaggerated cervical lordosis. Skull base and vertebrae: No acute fracture. No primary bone lesion or focal pathologic process. Osteopenia. Soft tissues and  spinal canal: No prevertebral fluid or swelling. No visible canal hematoma. Disc levels: Mild multilevel osteoarthritic changes. Mild to moderate posterior facet arthropathy. Mild osseous neural foraminal narrowing of bilateral C3-C4, and right C4-C5. CT THORACIC SPINE FINDINGS Alignment: Exaggerated thoracic kyphosis. Vertebrae: Mild progression of the moderate  compression fracture of T12 vertebral body with approximately 70% height loss centrally, with minimal retropulsion to the spinal canal. Paraspinal and other soft tissues: Negative. Disc levels: Diffuse spondylosis of the thoracic spine. Stigmata of diffuse idiopathic skeletal hyperostosis of the thoracic spine. Chest: Bilateral lung dependent atelectasis. Enlarged heart. Bulky annular calcifications of the mitral and aortic valves. Calcific atherosclerotic disease of the coronary arteries. CT LUMBAR SPINE FINDINGS Segmentation: 5 lumbar type vertebrae. Alignment: Exaggerated lumbar lordosis. Stable 9 mm retropulsion associated with chronic L1 burst fracture. Vertebrae: Prior vertebroplasty of L1, L2, L3 and L4. No acute fractures are seen. Paraspinal and other soft tissues: The paraspinal soft tissues are normal. Tortuosity of the abdominal aorta with a saccular dilation of the proximal infrarenal abdominal aorta measuring 2.2 cm and a second saccular dilation of the more distal infrarenal aorta measuring 2.4 cm. Disc levels: Mild-to-moderate multilevel osteoarthritic changes with disc osteophyte complexes and posterior facet arthropathy. L1-L2, mild spinal stenosis posterior to L1 vertebral body due to retropulsion, stable. L2-L3, mild disc bulge and facet arthropathy. L3-L4, broad-based disc bulge with minimal spinal canal stenosis and bilateral neural foraminal stenosis. L4-L5, mild broad-based disc bulge with mild bilateral neural foraminal stenosis. L5-S1, mild right neural foraminal stenosis. IMPRESSION: 1. Osteopenia.  No evidence of acute traumatic  injury to the spine. 2. Mild progression of the compression fracture of T12 vertebral body with approximately 70% height loss centrally, and mild retropulsion into the spinal canal. 3. Prior vertebroplasty of L1, L2, L3 and L4. Stable 9 mm retropulsion associated with the chronic L1 burst fracture. 4. Mild bilateral neural foraminal stenosis at C3-C4, C4-C5, and L3-L4, L4-L5 and L5-S1 due to osteoarthritic changes. 5. Enlarged heart with extensive annular calcifications of the mitral and aortic valves. Calcific atherosclerotic disease of the coronary arteries and the aorta. 6. Tortuosity of the abdominal aorta with a saccular dilation of the proximal infrarenal abdominal aorta measuring 2.2 cm and a second saccular dilation of the more distal infrarenal aorta measuring 2.4 cm. Electronically Signed   By: Fidela Salisbury M.D.   On: 11/20/2018 20:36   Ct Thoracic Spine Wo Contrast  Result Date: 11/20/2018 CLINICAL DATA:  Neck pain and chronic back pain. EXAM: CT CERVICAL, THORACIC, AND LUMBAR SPINE WITHOUT CONTRAST TECHNIQUE: Multidetector CT imaging of the cervical, thoracic and lumbar spine was performed without intravenous contrast. Multiplanar CT image reconstructions were also generated. COMPARISON:  Chest CT January 26, 2018, CT of the lumbosacral spine December 12, 2017, CT of the abdomen December 24, 2016 FINDINGS: CT CERVICAL SPINE FINDINGS Alignment: Slightly exaggerated cervical lordosis. Skull base and vertebrae: No acute fracture. No primary bone lesion or focal pathologic process. Osteopenia. Soft tissues and spinal canal: No prevertebral fluid or swelling. No visible canal hematoma. Disc levels: Mild multilevel osteoarthritic changes. Mild to moderate posterior facet arthropathy. Mild osseous neural foraminal narrowing of bilateral C3-C4, and right C4-C5. CT THORACIC SPINE FINDINGS Alignment: Exaggerated thoracic kyphosis. Vertebrae: Mild progression of the moderate compression fracture of T12  vertebral body with approximately 70% height loss centrally, with minimal retropulsion to the spinal canal. Paraspinal and other soft tissues: Negative. Disc levels: Diffuse spondylosis of the thoracic spine. Stigmata of diffuse idiopathic skeletal hyperostosis of the thoracic spine. Chest: Bilateral lung dependent atelectasis. Enlarged heart. Bulky annular calcifications of the mitral and aortic valves. Calcific atherosclerotic disease of the coronary arteries. CT LUMBAR SPINE FINDINGS Segmentation: 5 lumbar type vertebrae. Alignment: Exaggerated lumbar lordosis. Stable 9 mm retropulsion associated with chronic L1 burst fracture. Vertebrae: Prior  vertebroplasty of L1, L2, L3 and L4. No acute fractures are seen. Paraspinal and other soft tissues: The paraspinal soft tissues are normal. Tortuosity of the abdominal aorta with a saccular dilation of the proximal infrarenal abdominal aorta measuring 2.2 cm and a second saccular dilation of the more distal infrarenal aorta measuring 2.4 cm. Disc levels: Mild-to-moderate multilevel osteoarthritic changes with disc osteophyte complexes and posterior facet arthropathy. L1-L2, mild spinal stenosis posterior to L1 vertebral body due to retropulsion, stable. L2-L3, mild disc bulge and facet arthropathy. L3-L4, broad-based disc bulge with minimal spinal canal stenosis and bilateral neural foraminal stenosis. L4-L5, mild broad-based disc bulge with mild bilateral neural foraminal stenosis. L5-S1, mild right neural foraminal stenosis. IMPRESSION: 1. Osteopenia.  No evidence of acute traumatic injury to the spine. 2. Mild progression of the compression fracture of T12 vertebral body with approximately 70% height loss centrally, and mild retropulsion into the spinal canal. 3. Prior vertebroplasty of L1, L2, L3 and L4. Stable 9 mm retropulsion associated with the chronic L1 burst fracture. 4. Mild bilateral neural foraminal stenosis at C3-C4, C4-C5, and L3-L4, L4-L5 and L5-S1 due to  osteoarthritic changes. 5. Enlarged heart with extensive annular calcifications of the mitral and aortic valves. Calcific atherosclerotic disease of the coronary arteries and the aorta. 6. Tortuosity of the abdominal aorta with a saccular dilation of the proximal infrarenal abdominal aorta measuring 2.2 cm and a second saccular dilation of the more distal infrarenal aorta measuring 2.4 cm. Electronically Signed   By: Fidela Salisbury M.D.   On: 11/20/2018 20:36   Ct Lumbar Spine Wo Contrast  Result Date: 11/20/2018 CLINICAL DATA:  Neck pain and chronic back pain. EXAM: CT CERVICAL, THORACIC, AND LUMBAR SPINE WITHOUT CONTRAST TECHNIQUE: Multidetector CT imaging of the cervical, thoracic and lumbar spine was performed without intravenous contrast. Multiplanar CT image reconstructions were also generated. COMPARISON:  Chest CT January 26, 2018, CT of the lumbosacral spine December 12, 2017, CT of the abdomen December 24, 2016 FINDINGS: CT CERVICAL SPINE FINDINGS Alignment: Slightly exaggerated cervical lordosis. Skull base and vertebrae: No acute fracture. No primary bone lesion or focal pathologic process. Osteopenia. Soft tissues and spinal canal: No prevertebral fluid or swelling. No visible canal hematoma. Disc levels: Mild multilevel osteoarthritic changes. Mild to moderate posterior facet arthropathy. Mild osseous neural foraminal narrowing of bilateral C3-C4, and right C4-C5. CT THORACIC SPINE FINDINGS Alignment: Exaggerated thoracic kyphosis. Vertebrae: Mild progression of the moderate compression fracture of T12 vertebral body with approximately 70% height loss centrally, with minimal retropulsion to the spinal canal. Paraspinal and other soft tissues: Negative. Disc levels: Diffuse spondylosis of the thoracic spine. Stigmata of diffuse idiopathic skeletal hyperostosis of the thoracic spine. Chest: Bilateral lung dependent atelectasis. Enlarged heart. Bulky annular calcifications of the mitral and aortic  valves. Calcific atherosclerotic disease of the coronary arteries. CT LUMBAR SPINE FINDINGS Segmentation: 5 lumbar type vertebrae. Alignment: Exaggerated lumbar lordosis. Stable 9 mm retropulsion associated with chronic L1 burst fracture. Vertebrae: Prior vertebroplasty of L1, L2, L3 and L4. No acute fractures are seen. Paraspinal and other soft tissues: The paraspinal soft tissues are normal. Tortuosity of the abdominal aorta with a saccular dilation of the proximal infrarenal abdominal aorta measuring 2.2 cm and a second saccular dilation of the more distal infrarenal aorta measuring 2.4 cm. Disc levels: Mild-to-moderate multilevel osteoarthritic changes with disc osteophyte complexes and posterior facet arthropathy. L1-L2, mild spinal stenosis posterior to L1 vertebral body due to retropulsion, stable. L2-L3, mild disc bulge and facet arthropathy. L3-L4, broad-based  disc bulge with minimal spinal canal stenosis and bilateral neural foraminal stenosis. L4-L5, mild broad-based disc bulge with mild bilateral neural foraminal stenosis. L5-S1, mild right neural foraminal stenosis. IMPRESSION: 1. Osteopenia.  No evidence of acute traumatic injury to the spine. 2. Mild progression of the compression fracture of T12 vertebral body with approximately 70% height loss centrally, and mild retropulsion into the spinal canal. 3. Prior vertebroplasty of L1, L2, L3 and L4. Stable 9 mm retropulsion associated with the chronic L1 burst fracture. 4. Mild bilateral neural foraminal stenosis at C3-C4, C4-C5, and L3-L4, L4-L5 and L5-S1 due to osteoarthritic changes. 5. Enlarged heart with extensive annular calcifications of the mitral and aortic valves. Calcific atherosclerotic disease of the coronary arteries and the aorta. 6. Tortuosity of the abdominal aorta with a saccular dilation of the proximal infrarenal abdominal aorta measuring 2.2 cm and a second saccular dilation of the more distal infrarenal aorta measuring 2.4 cm.  Electronically Signed   By: Fidela Salisbury M.D.   On: 11/20/2018 20:36   Dg Chest Port 1 View  Result Date: 11/20/2018 CLINICAL DATA:  New oxygen requirement EXAM: PORTABLE CHEST 1 VIEW COMPARISON:  January 27, 2018 FINDINGS: There is elevation right hemidiaphragm. Opacity in left base could represent atelectasis or infiltrate with obscuration left hemidiaphragm. The cardiomediastinal silhouette is stable. No pneumothorax. IMPRESSION: Opacity in the left base obscuring left hemidiaphragm could represent atelectasis or infiltrate. Recommend follow-up to resolution. No other interval changes. Electronically Signed   By: Dorise Bullion III M.D   On: 11/20/2018 21:54    EKG: Independently reviewed.  Normal sinus rhythm.  Assessment/Plan Principal Problem:   Acute respiratory failure with hypoxia (HCC) Active Problems:   PAF (paroxysmal atrial fibrillation) (HCC)   CKD (chronic kidney disease), stage IV (HCC)   Essential hypertension   CAP (community acquired pneumonia)   Anemia in chronic renal disease   Closed compression fracture of body of L1 vertebra ([place])    1. Acute respiratory failure with hypoxia likely from pneumonia for which patient is on empiric antibiotics follow urine for Legionella strep antigen COVID-19 were negative. 2. Increasing back pain with progression of T12 compression.  Discussed with Dr. Rolena Infante orthopedic surgery in the morning. 3. History of paroxysmal atrial fibrillation rate controlled.  We will keep patient on heparin hold apixaban in anticipation of possible orthopedic procedure.  On Coreg and amiodarone. 4. Chronic systolic heart failure last EF measured in February 2020 has improved to 35 to 50%.  On Lasix.  Not on ACE inhibitors or ARB due to chronic kidney disease. 5. Chronic kidney disease stage IV creatinine appears to be at baseline. 6. Anemia of renal disease follow CBC. 7. On chronic steroid therapy reason not clear.  Given the complexity of  being hypoxic with pneumonia and persistent back pain with vertebral fractures patient will need inpatient status.   DVT prophylaxis: Heparin. Code Status: DNR confirmed with patient's husband. Family Communication: Patient husband. Disposition Plan: Home. Consults called: None. Admission status: Inpatient   Rise Patience MD [facility] Pager [phone].  If 7PM-7AM, please contact night-coverage [email or web address] Password [email or web address]  11/21/2018, 1:13 AM

## 2018-11-21 NOTE — Progress Notes (Signed)
Updated Merry Proud son regarding patient care plan.

## 2018-11-21 NOTE — Progress Notes (Signed)
Heather Benson was admitted to [facility] from the ED via stretcher.  The patient was transferred from stretcher to bed without incident.  The patient is alert and oriented x4.  Bed is in the lowest position.  Call bell and telephone are within reach.  Explained to patient how to use call bell and telephone, patient indicated understanding.  Admission booklet given.  Patient denies any other needs at this time.

## 2018-11-21 NOTE — Progress Notes (Signed)
ANTICOAGULATION CONSULT NOTE - Initial Consult  Pharmacy Consult for heparin Indication: atrial fibrillation  Allergies  Allergen Reactions  . Augmentin [Amoxicillin-Pot Clavulanate] Diarrhea and Nausea Only  . Boniva [Ibandronic Acid] Diarrhea  . Codeine Other (See Comments)    "Spaced out feeling" and Lightheadedness  . Hydromorphone Nausea And Vomiting  . Latex Rash    "Burns me"  . Lisinopril Cough  . Amoxicillin   . Clavulanic Acid   . Adhesive [Tape] Itching and Rash    Patient Measurements: Height: 5' 6.5" (168.9 cm) Weight: 151 lb 14.4 oz (68.9 kg) IBW/kg (Calculated) : 60.45  Vital Signs: Temp: 98.1 F (36.7 C) (07/11 0553) BP: 115/66 (07/11 0553) Pulse Rate: 61 (07/11 0553)  Labs: Recent Labs    11/20/18 2220 11/21/18 0046 11/21/18 0517  HGB 13.4  --  12.8  HCT 45.1  --  43.3  PLT 265  --  271  CREATININE 2.09*  --  2.05*  TROPONINIHS 36* 38*  --     Estimated Creatinine Clearance: 19.2 mL/min (A) (by C-G formula based on SCr of 2.05 mg/dL (H)).   Medical History: Past Medical History:  Diagnosis Date  . Aortic atherosclerosis ([place]) 12/25/2016  . Aortic regurgitation    a. mild-mod by echo 01/2018.  [name] Aortic stenosis, mild 12/14/2015  . Arthritis 08-28-11   right knee pain-surgery planned  . CAD (coronary artery disease)    a. by cath 2012. b. Elev troponin 01/2018 -> cath not pursued due to renal dysfunction.  . Cancer [facility]) 08-28-11   Melonoma-cheek(many Yrs ago) , recent bx. left  face lesion, past skin cancer lesions  . CAP (community acquired pneumonia) 12/13/2015  . Chronic combined systolic and diastolic heart failure (HCC)    prob ischemic CM // admx with NSTEMI in 9/19 in setting of pneumonia // Echo 9/19: EF 35-40 // LHC avoided due to CKD //   . Complication of anesthesia 08-28-11   in past arrythmia-  cardiology has seen in past  . Dilated cardiomyopathy ([place]) 03/20/2018   admx with NSTEMI in setting of prob pneumonia in 9/19 >> prob  ischemic CM // Echo 9/19:  EF 35-40, anteroseptal, anterior, inferior, apical hypokinesis consistent with ischemia in the distribution of the LAD, grade 1 diastolic dysfunction, mild aortic stenosis (mean 7, peak 13), mild aortic insufficiency, moderate MAC, mild to moderate MR, moderate to severe LAE, mild RAE, moderate TR, PASP 59 //   . GERD (gastroesophageal reflux disease) 08-28-11   tx. omeprazole  . Hypercholesterolemia   . Hypertension 08-28-11   tx. meds  . IBS (irritable bowel syndrome)   . Lymphedema   . Mild mitral regurgitation   . Neuromuscular disorder ([place]) 08-28-11   hx. Polio age 48-slight residual affects legs  . Osteoarthritis    back and left knee  . Osteoporosis   . PAF (paroxysmal atrial fibrillation) ([place])   . Pneumonia 12/2015   hospital x 3 days  . PONV (postoperative nausea and vomiting)   . Renal cyst, right   . Sarcoidosis   . SVT (supraventricular tachycardia) ([place])   . Swelling of extremity 08-28-11   bilateral lower extremities- mid calf down    Medications:  Medications Prior to Admission  Medication Sig Dispense Refill Last Dose  . acetaminophen (TYLENOL) 500 MG tablet Take 1,000 mg by mouth every 8 (eight) hours as needed (for pain).    Past Month at Unknown time  . amiodarone (PACERONE) 200 MG tablet TAKE 1 TABLET BY MOUTH  EVERY DAY (Patient taking differently: Take 200 mg by mouth daily. ) 90 tablet 1 11/20/2018 at Unknown time  . apixaban (ELIQUIS) 2.5 MG TABS tablet Take 1 tablet (2.5 mg total) by mouth 2 (two) times daily. PLEASE START ON 02/03/18 AT 9PM   11/20/2018 at 0900  . atorvastatin (LIPITOR) 10 MG tablet Take 1 tablet (10 mg total) by mouth daily. 90 tablet 3 11/20/2018 at Unknown time  . carvedilol (COREG) 3.125 MG tablet Take 1 tablet (3.125 mg total) by mouth 2 (two) times daily. 180 tablet 3 11/20/2018 at 0900  . furosemide (LASIX) 20 MG tablet Take 1 tablet (20 mg total) by mouth daily.   11/20/2018 at Unknown time  .  HYDROcodone-acetaminophen (NORCO/VICODIN) 5-325 MG tablet Take 1 tablet by mouth 2 (two) times daily as needed for pain.   11/20/2018 at Unknown time  . latanoprost (XALATAN) 0.005 % ophthalmic solution Place 1 drop into both eyes at bedtime.  3 11/19/2018 at Unknown time  . mirtazapine (REMERON) 15 MG tablet Take 15 mg by mouth at bedtime.   11/19/2018 at Unknown time  . nitroGLYCERIN (NITROSTAT) 0.4 MG SL tablet Place 1 tablet (0.4 mg total) under the tongue every 5 (five) minutes as needed for chest pain. 90 tablet 3 unk  . polyethylene glycol (MIRALAX / GLYCOLAX) packet Take 17 g by mouth daily as needed. 14 each 0 Past Month at Unknown time  . potassium chloride (K-DUR) 10 MEQ tablet Take 10 mEq by mouth daily.   11/20/2018 at Unknown time  . predniSONE (DELTASONE) 10 MG tablet Take 10 mg by mouth daily with breakfast.   11/20/2018 at Unknown time  . Respiratory Therapy Supplies (FLUTTER) DEVI Use as directed. 1 each 0    Scheduled:  . amiodarone  200 mg Oral Daily  . atorvastatin  10 mg Oral Daily  . carvedilol  3.125 mg Oral BID  . feeding supplement (ENSURE ENLIVE)  237 mL Oral BID BM  . furosemide  20 mg Oral Daily  . latanoprost  1 drop Both Eyes QHS  . mouth rinse  15 mL Mouth Rinse BID  . mirtazapine  15 mg Oral QHS  . potassium chloride  10 mEq Oral Daily  . predniSONE  10 mg Oral Q breakfast   Infusions:  . azithromycin    . cefTRIAXone (ROCEPHIN)  IV      Assessment: 85yo female c/o back/neck pain x months, found to have compression fracture and PNA, to hold Eliquis and start heparin for Afib; last dose of Eliquis 7/10 9am.  Goal of Therapy:  Heparin level 0.3-0.7 units/ml aPTT 66-102 seconds Monitor platelets by anticoagulation protocol: Yes   Plan:  Will start heparin gtt at 1000 units/hr and monitor heparin levels, aPTT (while Eliquis affects anti-Xa), and CBC.  Wynona Neat, PharmD, BCPS  11/21/2018,7:23 AM

## 2018-11-21 NOTE — Progress Notes (Signed)
Initial Nutrition Assessment  DOCUMENTATION CODES:  Not applicable  INTERVENTION:  Continue Ensure Enlive po BID, each supplement provides 350 kcal and 20 grams of protein  Liquid MVI with minerals  NUTRITION DIAGNOSIS:  Inadequate oral intake related to poor appetite as evidenced by Loss of >50 lbs in last 3 years.  GOAL:  Patient will meet greater than or equal to 90% of their needs  MONITOR:  PO intake, Weight trends, Supplement acceptance, Diet advancement, Labs, I & O's, Skin  REASON FOR ASSESSMENT:  Malnutrition Screening Tool    ASSESSMENT:  85 y/o female PMHx CHF, CKD2, Afib, CAD, HTN, HLD, IBS, GERD. Presented via EMS at request of PCP after her CT of spine revealed progression of T12 vertebral fracture. EMS also found pt to be hypoxic w/ 02 86% and CXR suggests PNA. Admitted for management of Acute resp failure and potential surgical intervention for T12 compression  RD operating remotely d/t covid precautions. Was able to reach patient via phone.   Pt confirms she has not been eating well recently. She says that while her back pain is bad, it is not to the point where it has impacted her oral intake. She states she just doesn't get hungry. At baseline, she reports following a HF diet and drinking Ensure regularly. She does not take a daily mvi.   Wt wise, she says her UBW is 155 lbs. Current weight, measured via bed scale, is 68.9 kg or 152 lbs. Per chart, pt was 210 lbs less than 3 years ago, with 20 of those lbs coming as a result of a hospitalization last fall. However, her weight since then appears relatively table in low-mid 150s.   Suspect patients lack of appetite is multifactorial, resulting from age related anorexia, her current acute illness, as well as the opioids she is prescribed for her back pain- though unsure how long she has been on these. Ideally would place pt on Ensure TID however she is on a 1.2 L fluid restriction. Will continue Ensure BID for now.    Labs: BUN/Creat: 29/2.05, Albumin: 2.1, WBC:14.0, Glu: 62-94 Meds: Lasix, Remeron, KCL, Prednisone, KCL, Ensure Enlive  Recent Labs  Lab 11/20/18 2220 11/21/18 0517  NA 137 141  K 4.8 4.8  CL 99 102  CO2 27 25  BUN 32* 29*  CREATININE 2.09* 2.05*  CALCIUM 8.4* 8.2*  GLUCOSE 66* 62*   NUTRITION - FOCUSED PHYSICAL EXAM: Unable to conduct  Diet Order:   Diet Order            Diet Heart Room service appropriate? Yes; Fluid consistency: Thin; Fluid restriction: 1200 mL Fluid  Diet effective now             EDUCATION NEEDS:  No education needs have been identified at this time  Skin:  MASD to buttocks, sacrum, groin  Last BM:  7/10  Height:  Ht Readings from Last 1 Encounters:  11/21/18 5' 6.5" (1.689 m)   Weight:  Wt Readings from Last 1 Encounters:  11/21/18 68.9 kg   Wt Readings from Last 10 Encounters:  11/21/18 68.9 kg  09/24/18 67.2 kg  06/24/18 68.5 kg  04/14/18 71.3 kg  03/20/18 69.4 kg  02/03/18 75.2 kg  11/20/17 78 kg  10/22/17 80.5 kg  07/24/17 85.7 kg  07/17/17 86.2 kg   Ideal Body Weight:  60.22 kg  BMI:  Body mass index is 24.15 kg/m.  Estimated Nutritional Needs:  Kcal:  1700-1850 (25-27 kcal/kg bw) Protein:  83-  96g Pro (1.2-1.4g/kg bw) Fluid:  <1.2 L (MD ordered fluid restricition)  Burtis Junes RD, LDN, CNSC Clinical Nutrition Available Tues-Sat via Pager: [phone] 11/21/2018 3:19 PM

## 2018-11-21 NOTE — Progress Notes (Signed)
PROGRESS NOTE  [name]:[id number] DOB: 1932/03/17 DOA: 11/20/2018 PCP: [name], MD  Brief History   Heather Benson is a 85 y.o. female with history of chronic systolic heart failure paroxysmal atrial fibrillation CAD hypertension hyperlipidemia with previous vertebroplasty's has been experiencing increasing back pain over the last 2 weeks.  Patient is supposed to see orthopedic but since patient pain is worsening came to the ER.  Patient has been also noted that patient has been getting more short of breath last few days.  ED Course: In the ER patient was found to be febrile and hypoxic with temperature 100 F chest x-ray shows opacity concerning for pneumonia.  Patient had a CT C-spine L-spine and T-spine.  Shows progression of the T12 vertebral body compression fracture with some retropulsion.  Given the fever hypoxia and chest x-ray showing infiltrates patient was started on antibiotics for pneumonia.  Labs showed WBC count of 14 creatinine 2 platelets 265.  The patient was admitted to a medical bed. She has been started on Rocephin and zithromax for a pneumonia. She has been continued on her home medications as possible. She has had some transient low blood pressures early this morning.  Consultants  . None  Procedures  . None  Antibiotics   Anti-infectives (From admission, onward)   Start     Dose/Rate Route Frequency Ordered Stop   11/21/18 2100  cefTRIAXone (ROCEPHIN) 2 g in sodium chloride 0.9 % 100 mL IVPB     2 g 200 mL/hr over 30 Minutes Intravenous Every 24 hours 11/21/18 0421 11/26/18 2059   11/21/18 2100  azithromycin (ZITHROMAX) 500 mg in sodium chloride 0.9 % 250 mL IVPB     500 mg 250 mL/hr over 60 Minutes Intravenous Every 24 hours 11/21/18 0421 11/26/18 2059   11/20/18 2215  cefTRIAXone (ROCEPHIN) 1 g in sodium chloride 0.9 % 100 mL IVPB     1 g 200 mL/hr over 30 Minutes Intravenous  Once 11/20/18 2204 11/20/18 2321   11/20/18 2215   azithromycin (ZITHROMAX) 500 mg in sodium chloride 0.9 % 250 mL IVPB     500 mg 250 mL/hr over 60 Minutes Intravenous  Once 11/20/18 2204 11/20/18 2352    .  [name]   Subjective  The patient is resting comfortably. No new complaints.  Objective   Vitals:  Vitals:   11/21/18 0800 11/21/18 1216  BP: (!) 99/56 (!) 110/52  Pulse:  63  Resp:  16  Temp:  98.1 F (36.7 C)  SpO2:  98%    Exam:  Constitutional:  . The patient is awake, and alert. No acute distress. Respiratory:  . No increased work of breathing.  . No wheezes, rales, or rhonchi. . No tactile fremitus. Cardiovascular:  . Regular rate and rhythm . No murmurs, ectopy, or gallups . No lateral PMI. No thrills. Abdomen:  . Abdomen is soft, non-tender, non-distended. . No hernias, masses, or organomegaly. . Normoactive bowel sounds. Musculoskeletal:  . No cyanosis, clubbing, or edema Skin:  . No rashes, lesions, ulcers . palpation of skin: no induration or nodules Neurologic:  . CN 2-12 intact . Sensation all 4 extremities intact Psychiatric:  . Mental status o Mood, affect appropriate o Orientation to person, place, time   I have personally reviewed the following:   Today's Data  . Vitals, CMP, CBC  Micro Data  . Blood cultures, urine cultures, COVID-19 negative  Imaging  . CXR   Scheduled Meds: . amiodarone  200 mg  Oral Daily  . atorvastatin  10 mg Oral Daily  . carvedilol  3.125 mg Oral BID  . feeding supplement (ENSURE ENLIVE)  237 mL Oral BID BM  . furosemide  20 mg Oral Daily  . latanoprost  1 drop Both Eyes QHS  . mouth rinse  15 mL Mouth Rinse BID  . mirtazapine  15 mg Oral QHS  . potassium chloride  10 mEq Oral Daily  . predniSONE  10 mg Oral Q breakfast   Continuous Infusions: . azithromycin    . cefTRIAXone (ROCEPHIN)  IV    . heparin 1,000 Units/hr (11/21/18 2800)    Principal Problem:   Acute respiratory failure with hypoxia (HCC) Active Problems:   PAF (paroxysmal atrial  fibrillation) (HCC)   CKD (chronic kidney disease), stage IV (HCC)   Essential hypertension   CAP (community acquired pneumonia)   Anemia in chronic renal disease   Closed compression fracture of body of L1 vertebra (HCC)   LOS: 1 day    A & P   Acute respiratory failure with hypoxia likely from pneumonia for which patient is on empiric antibiotics follow urine for Legionella strep antigen COVID-19 were negative. Follow blood cultures. Pt continues to require supplemental O2. Appears acutely ill.  Increasing back pain with progression of T12 compression:  Discussed with Dr. Rolena Infante orthopedic surgery in the morning. Will ask family about the reason for the chronic steroids as this is likely at least a part of the reason for the compression fractures.  History of paroxysmal atrial fibrillation rate controlled.  We will keep patient on heparin hold apixaban in anticipation of possible orthopedic procedure.  On Coreg and amiodarone.  Chronic systolic heart failure last EF measured in February 2020 has improved to 35 to 50%.  On Lasix.  Not on ACE inhibitors or ARB due to chronic kidney disease. Monitor volume status.   Chronic kidney disease stage [id number] appears to be at baseline. Monitor creatinine, electrolytes, and volume status.  Anemia of renal disease follow CBC.  On chronic steroid therapy reason not clear. Sarcoidosis is documented on Epic, but patient denies this.  DVT prophylaxis: Heparin. Code Status: DNR confirmed with patient's husband. Family Communication: None available. Disposition Plan: Home.  Selicia Windom, DO [facility] Direct contact: see [email or web address]  7PM-7AM contact night coverage as above 11/21/2018, 2:48 PM  LOS: 1 day

## 2018-11-22 LAB — CBC
HCT: 39 % (ref 36.0–46.0)
Hemoglobin: 11.8 g/dL — ABNORMAL LOW (ref 12.0–15.0)
MCH: 28.2 pg (ref 26.0–34.0)
MCHC: 30.3 g/dL (ref 30.0–36.0)
MCV: 93.3 fL (ref 80.0–100.0)
Platelets: 226 10*3/uL (ref 150–400)
RBC: 4.18 MIL/uL (ref 3.87–5.11)
RDW: 16.3 % — ABNORMAL HIGH (ref 11.5–15.5)
WBC: 13.9 10*3/uL — ABNORMAL HIGH (ref 4.0–10.5)
nRBC: 0 % (ref 0.0–0.2)

## 2018-11-22 LAB — BASIC METABOLIC PANEL
Anion gap: 9 (ref 5–15)
BUN: 34 mg/dL — ABNORMAL HIGH (ref 8–23)
CO2: 26 mmol/L (ref 22–32)
Calcium: 8.2 mg/dL — ABNORMAL LOW (ref 8.9–10.3)
Chloride: 104 mmol/L (ref 98–111)
Creatinine, Ser: 1.89 mg/dL — ABNORMAL HIGH (ref 0.44–1.00)
GFR calc Af Amer: 28 mL/min — ABNORMAL LOW (ref >60)
GFR calc non Af Amer: 24 mL/min — ABNORMAL LOW (ref >60)
Glucose, Bld: 114 mg/dL — ABNORMAL HIGH (ref 70–99)
Potassium: 4.8 mmol/L (ref 3.5–5.1)
Sodium: 139 mmol/L (ref 135–145)

## 2018-11-22 LAB — APTT
aPTT: 87 seconds — ABNORMAL HIGH (ref 24–36)
aPTT: 59 seconds — ABNORMAL HIGH (ref 24–36)

## 2018-11-22 LAB — LEGIONELLA PNEUMOPHILA SEROGP 1 UR AG: L. pneumophila Serogp 1 Ur Ag: NEGATIVE

## 2018-11-22 LAB — HEPARIN LEVEL (UNFRACTIONATED): Heparin Unfractionated: 0.94 IU/mL — ABNORMAL HIGH (ref 0.30–0.70)

## 2018-11-22 NOTE — Progress Notes (Addendum)
ANTICOAGULATION CONSULT NOTE - Follow Up Consult  Pharmacy Consult for Heparin Indication: atrial fibrillation  Allergies  Allergen Reactions  . Augmentin [Amoxicillin-Pot Clavulanate] Diarrhea and Nausea Only  . Boniva [Ibandronic Acid] Diarrhea  . Codeine Other (See Comments)    "Spaced out feeling" and Lightheadedness  . Hydromorphone Nausea And Vomiting  . Latex Rash    "Burns me"  . Lisinopril Cough  . Amoxicillin   . Clavulanic Acid   . Adhesive [Tape] Itching and Rash    Patient Measurements: Height: 5' 6.5" (168.9 cm) Weight: 151 lb 14.4 oz (68.9 kg) IBW/kg (Calculated) : 60.45 Heparin Dosing Weight: 68.9 kg  Vital Signs: BP: 116/58 (07/12 0536) Pulse Rate: 53 (07/12 0536)  Labs: Recent Labs    11/20/18 2220 11/21/18 0046 11/21/18 0517 11/21/18 1612 11/22/18 0354  HGB 13.4  --  12.8  --  11.8*  HCT 45.1  --  43.3  --  39.0  PLT 265  --  271  --  226  APTT  --   --   --  37* 87*  HEPARINUNFRC  --   --   --  1.14* 0.94*  CREATININE 2.09*  --  2.05*  --  1.89*  TROPONINIHS 36* 38*  --   --   --     Estimated Creatinine Clearance: 20.8 mL/min (A) (by C-G formula based on SCr of 1.89 mg/dL (H)).  Assessment: 85yo female c/o back/neck pain x months, found to have compression fracture and PNA; holding Eliquis (last dose 7/10 9am), started heparin for Afib. H&H stable, PLT WNL, no s/sx of bleed.  Monitoring aPTT daily d/t effect of PTA Apixaban on HL. aPTT therapeutic on heparin 1000 units/hour.  HL elevated d/t prior Apixaban.    Goal of Therapy:  Heparin level 0.3-0.7 units/ml aPTT 66-102 seconds Monitor platelets by anticoagulation protocol: Yes   Plan:  Will continue heparin gtt 1000 units/hour. Will continue to monitor HL, aPTT (while Eliquis affects anti-Xa), and CBC.   Ashlye Oviedo L. Devin Going, PharmD, [employer] PGY1 Pharmacy Resident [employer]: [phone]  Mobile: [phone]  11/22/18 11:43 AM  Please check AMION for all [place] phone  numbers After 10:00 PM, call the [facility] [phone]

## 2018-11-22 NOTE — Progress Notes (Signed)
Occupational Therapy Evaluation Patient Details Name: Heather Benson MRN: [id number] DOB: 10/03/1931 Today's Date: 11/22/2018    History of Present Illness Heather Benson a 85 y.o.femalewithhistory of chronic systolic heart failure paroxysmal atrial fibrillation CAD hypertension hyperlipidemia with previous vertebroplasty's has been experiencing increasing back pain over the last 2 weeks. Patient is supposed to see orthopedic but since patient pain is worsening came to the ER. Patient has been also noted that patient has been getting more short of breath last few days.   Clinical Impression   Pt admitted with above deficits. PTA, pt lives with husband who she reports assists her with ADLs. She also reports that she ambulates with RW at home. Today, pt is very limited due to back pain. Pt also with very flat affect and tearful throughout session, stating that her family does not want her. Pt did participate in rolling in bed but question effort put forth by patient.  Min assist for UB ADLs and max-total for LB ADLs at bed level.  Recommending SNF for discharge planning based on level of assist needed today. However, may possibly update to [facility] pending pt progress and level of family support at home. Will continue to follow acutely in order to maximize safety and independence with ADLs.    Follow Up Recommendations  SNF;Supervision/Assistance - 24 hour    Equipment Recommendations  (defer to next venue)    Recommendations for Other Services       Precautions / Restrictions Precautions Precautions: Back;Fall Restrictions Weight Bearing Restrictions: No      Mobility Bed Mobility Overal bed mobility: Needs Assistance Bed Mobility: Rolling Rolling: Mod assist         General bed mobility comments: Assist with rolling to left and right sides to assist with positioning pads in bed.   Transfers Overall transfer level: (pt unable to participate due to back pain)                     Balance                                           ADL either performed or assessed with clinical judgement   ADL Overall ADL's : Needs assistance/impaired     Grooming: Wash/dry face;Wash/dry hands;Supervision/safety;Bed level   Upper Body Bathing: Minimal assistance;Bed level   Lower Body Bathing: Maximal assistance;Bed level   Upper Body Dressing : Minimal assistance;Bed level   Lower Body Dressing: Total assistance;Bed level                 General ADL Comments: Unable to perform OOB mobility due to back pain.     Vision         Perception     Praxis      Pertinent Vitals/Pain Pain Assessment: 0-10 Pain Score: 8  Pain Location: back Pain Descriptors / Indicators: Discomfort;Grimacing Pain Intervention(s): Limited activity within patient's tolerance;Repositioned     Hand Dominance     Extremity/Trunk Assessment Upper Extremity Assessment Upper Extremity Assessment: Generalized weakness   Lower Extremity Assessment Lower Extremity Assessment: Defer to PT evaluation       Communication Communication Communication: No difficulties   Cognition Arousal/Alertness: Awake/alert Behavior During Therapy: Flat affect Overall Cognitive Status: Within Functional Limits for tasks assessed  General Comments: Pt very tearful during session stating "I hate being here. My family doesn't want me anway." Limited eye contact, looking out of window most of session instead of looking at therapist.   General Comments       Exercises     Shoulder Instructions      Home Living Family/patient expects to be discharged to:: Private residence Living Arrangements: Spouse/significant other Available Help at Discharge: Family;Available 24 hours/day Type of Home: House Home Access: Level entry     Home Layout: One level     Bathroom Shower/Tub: Occupational psychologist:  Handicapped height Bathroom Accessibility: Yes How Accessible: Accessible via walker Home Equipment: [place] - 2 wheels;Cane - single point;Hand held shower head;Grab bars - tub/shower;Shower seat - built in          Prior Functioning/Environment Level of Independence: Needs assistance  Gait / Transfers Assistance Needed: Uses RW for ambulation ADL's / Homemaking Assistance Needed: Pt reports husband assists with bathing and dressing as needed.             OT Problem List: Pain;Decreased knowledge of use of DME or AE;Decreased strength;Decreased activity tolerance      OT Treatment/Interventions: Self-care/ADL training;DME and/or AE instruction;Therapeutic activities;Patient/family education    OT Goals(Current goals can be found in the care plan section) Acute Rehab OT Goals Patient Stated Goal: for pain to improve OT Goal Formulation: With patient Time For Goal Achievement: 12/06/18 Potential to Achieve Goals: Fair  OT Frequency: Min 2X/week   Barriers to D/C:            Co-evaluation              AM-PAC OT "6 Clicks" Daily Activity     Outcome Measure Help from another person eating meals?: None Help from another person taking care of personal grooming?: None Help from another person toileting, which includes using toliet, bedpan, or urinal?: A Lot Help from another person bathing (including washing, rinsing, drying)?: A Lot Help from another person to put on and taking off regular upper body clothing?: A Little Help from another person to put on and taking off regular lower body clothing?: Total 6 Click Score: 16   End of Session    Activity Tolerance: Patient limited by pain Patient left: in bed;with call bell/phone within reach  OT Visit Diagnosis: Pain;Muscle weakness (generalized) (M62.81) Pain - part of body: (back)                Time: 0109-3235 OT Time Calculation (min): 12 min Charges:  OT General Charges $OT Visit: 1 Visit OT Evaluation $OT  Eval Moderate Complexity: 1 Mod    Darrol Jump OTR/L [place] [phone] 11/22/2018, 3:44 PM

## 2018-11-22 NOTE — Progress Notes (Signed)
ANTICOAGULATION CONSULT NOTE - Follow Up Consult  Pharmacy Consult for Heparin Indication: atrial fibrillation  Allergies  Allergen Reactions  . Augmentin [Amoxicillin-Pot Clavulanate] Diarrhea and Nausea Only  . Boniva [Ibandronic Acid] Diarrhea  . Codeine Other (See Comments)    "Spaced out feeling" and Lightheadedness  . Hydromorphone Nausea And Vomiting  . Latex Rash    "Burns me"  . Lisinopril Cough  . Amoxicillin   . Clavulanic Acid   . Adhesive [Tape] Itching and Rash    Patient Measurements: Height: 5' 6.5" (168.9 cm) Weight: 151 lb 14.4 oz (68.9 kg) IBW/kg (Calculated) : 60.45 Heparin Dosing Weight: 68.9 kg  Vital Signs: Temp: 97.8 F (36.6 C) (07/12 1149) Temp Source: Oral (07/12 1149) BP: 118/69 (07/12 1149) Pulse Rate: 52 (07/12 1149)  Labs: Recent Labs    11/20/18 2220 11/21/18 0046 11/21/18 0517 11/21/18 1612 11/22/18 0354 11/22/18 1310  HGB 13.4  --  12.8  --  11.8*  --   HCT 45.1  --  43.3  --  39.0  --   PLT 265  --  271  --  226  --   APTT  --   --   --  37* 87* 59*  HEPARINUNFRC  --   --   --  1.14* 0.94*  --   CREATININE 2.09*  --  2.05*  --  1.89*  --   TROPONINIHS 36* 38*  --   --   --   --     Estimated Creatinine Clearance: 20.8 mL/min (A) (by C-G formula based on SCr of 1.89 mg/dL (H)).  Assessment: 85yo female c/o back/neck pain x months, found to have compression fracture and PNA; holding Eliquis (last dose 7/10 9am), started heparin for Afib.   Monitoring aPTT daily d/t effect of PTA Apixaban on HL. APTT aPTT 87>59 on heparin 1000 units/hour - spoke with nurse to assess line issues, other factors for rapid decrease in levels; no issues with line.   HL elevated d/t prior Apixaban.  HL 1.14>0.94  H&H stable, PLT WNL, no s/sx of bleed.  Goal of Therapy:  Heparin level 0.3-0.7 units/ml aPTT 66-102 seconds Monitor platelets by anticoagulation protocol: Yes   Plan:  Will increase heparin gtt to 1150 units/hour. 6hr aPTT  2130. Will continue to monitor HL, aPTT (while Eliquis affects anti-Xa), and CBC.   Jeromie Gainor L. Devin Going, PharmD, [place] PGY1 Pharmacy Resident [place]: [phone]  Mobile: [phone]  11/22/18 2:48 PM  Please check AMION for all [place] phone numbers After 10:00 PM, call the [place] [phone]

## 2018-11-22 NOTE — Progress Notes (Signed)
PROGRESS NOTE  [name] CBS:[id number] DOB: 03/31/1932 DOA: 11/20/2018 PCP: [name], MD  Brief History   Heather Benson is a 85 y.o. female with history of chronic systolic heart failure paroxysmal atrial fibrillation CAD hypertension hyperlipidemia with previous vertebroplasty's has been experiencing increasing back pain over the last 2 weeks.  Patient is supposed to see orthopedic but since patient pain is worsening came to the ER.  Patient has been also noted that patient has been getting more short of breath last few days.  ED Course: In the ER patient was found to be febrile and hypoxic with temperature 100 F chest x-ray shows opacity concerning for pneumonia.  Patient had a CT C-spine L-spine and T-spine.  Shows progression of the T12 vertebral body compression fracture with some retropulsion.  Given the fever hypoxia and chest x-ray showing infiltrates patient was started on antibiotics for pneumonia.  Labs showed WBC count of 14 creatinine 2 platelets 265.  The patient was admitted to a medical bed. She has been started on Rocephin and zithromax for a pneumonia. She has been continued on her home medications as possible. She has had some transient low blood pressures early this morning.  Consultants  . None  Procedures  . None  Antibiotics   Anti-infectives (From admission, onward)   Start     Dose/Rate Route Frequency Ordered Stop   11/21/18 2100  cefTRIAXone (ROCEPHIN) 2 g in sodium chloride 0.9 % 100 mL IVPB     2 g 200 mL/hr over 30 Minutes Intravenous Every 24 hours 11/21/18 0421 11/26/18 2059   11/21/18 2100  azithromycin (ZITHROMAX) 500 mg in sodium chloride 0.9 % 250 mL IVPB     500 mg 250 mL/hr over 60 Minutes Intravenous Every 24 hours 11/21/18 0421 11/26/18 2059   11/20/18 2215  cefTRIAXone (ROCEPHIN) 1 g in sodium chloride 0.9 % 100 mL IVPB     1 g 200 mL/hr over 30 Minutes Intravenous  Once 11/20/18 2204 11/20/18 2321   11/20/18 2215   azithromycin (ZITHROMAX) 500 mg in sodium chloride 0.9 % 250 mL IVPB     500 mg 250 mL/hr over 60 Minutes Intravenous  Once 11/20/18 2204 11/20/18 2352     .   Subjective  The patient is resting comfortably. No new complaints.  Objective   Vitals:  Vitals:   11/22/18 0536 11/22/18 1149  BP: (!) 116/58 118/69  Pulse: (!) 53 (!) 52  Resp:  16  Temp:  97.8 F (36.6 C)  SpO2: 100% 99%    Exam:  Constitutional:  . The patient is awake, and alert. No acute distress. Respiratory:  . No increased work of breathing.  . No wheezes, rales, or rhonchi. . No tactile fremitus. Cardiovascular:  . Regular rate and rhythm . No murmurs, ectopy, or gallups . No lateral PMI. No thrills. Abdomen:  . Abdomen is soft, non-tender, non-distended. . No hernias, masses, or organomegaly. . Normoactive bowel sounds. Musculoskeletal:  . No cyanosis, clubbing, or edema Skin:  . No rashes, lesions, ulcers . palpation of skin: no induration or nodules Neurologic:  . CN 2-12 intact . Sensation all 4 extremities intact Psychiatric:  . Mental status o Mood, affect appropriate o Orientation to person, place, time   I have personally reviewed the following:   Today's Data  . Vitals, CMP, CBC  Micro Data  . Blood cultures, urine cultures, COVID-19 negative  Imaging  . CXR   Scheduled Meds: . amiodarone  200 mg  Oral Daily  . atorvastatin  10 mg Oral Daily  . carvedilol  3.125 mg Oral BID  . feeding supplement (ENSURE ENLIVE)  237 mL Oral BID BM  . furosemide  20 mg Oral Daily  . latanoprost  1 drop Both Eyes QHS  . mouth rinse  15 mL Mouth Rinse BID  . mirtazapine  15 mg Oral QHS  . multivitamin  15 mL Oral Daily  . potassium chloride  10 mEq Oral Daily  . predniSONE  10 mg Oral Q breakfast   Continuous Infusions: . sodium chloride 100 mL (11/21/18 2123)  . azithromycin Stopped (11/21/18 2223)  . cefTRIAXone (ROCEPHIN)  IV 2 g (11/21/18 2245)  . heparin 1,150 Units/hr  (11/22/18 1455)    Principal Problem:   Acute respiratory failure with hypoxia (HCC) Active Problems:   PAF (paroxysmal atrial fibrillation) (HCC)   CKD (chronic kidney disease), stage IV (HCC)   Essential hypertension   CAP (community acquired pneumonia)   Anemia in chronic renal disease   Closed compression fracture of body of L1 vertebra (HCC)   LOS: 2 days    A & P   Acute respiratory failure with hypoxia likely from pneumonia for which patient is on empiric antibiotics follow urine for Legionella strep antigen COVID-19 were negative. Follow blood cultures. Pt continues to require supplemental O2. Slowly improving. Weaning O2 as possible. PT/OT eval and treat.  Increasing back pain with progression of T12 compression:  Discussed with Dr. Rolena Infante orthopedic surgery in the morning. Will ask family about the reason for the chronic steroids as this is likely at least a part of the reason for the compression fractures.  History of paroxysmal atrial fibrillation rate controlled.  We will keep patient on heparin hold apixaban in anticipation of possible orthopedic procedure.  On Coreg and amiodarone.  Chronic systolic heart failure last EF measured in February 2020 has improved to 35 to 50%.  On Lasix.  Not on ACE inhibitors or ARB due to chronic kidney disease. Monitor volume status.   Chronic kidney disease stage WI:[id number] appears to be at baseline. Monitor creatinine, electrolytes, and volume status.  Anemia of renal disease follow CBC.  On chronic steroid therapy reason not clear. Sarcoidosis is documented on Epic, but patient denies this.  I have seen and examined this patient myself. I have spent 32 minutes in her evaluation and care.  DVT prophylaxis: Heparin. Code Status: DNR confirmed with patient's husband. Family Communication: None available. Disposition Plan: Home.  Caius Silbernagel, DO [facility] Direct contact: see [email or web address]  7PM-7AM contact night  coverage as above 11/22/2018, 3:23 PM  LOS: 1 day

## 2018-11-22 NOTE — Evaluation (Signed)
Physical Therapy Evaluation Patient Details Name: Heather Benson MRN: [id number] DOB: May 14, 1931 Today's Date: 11/22/2018   History of Present Illness  Heather Benson a 85 y.o.femalewithhistory of chronic systolic heart failure paroxysmal atrial fibrillation CAD hypertension hyperlipidemia with previous vertebroplasty's has been experiencing increasing back pain over the last 2 weeks. Patient is supposed to see orthopedic but since patient pain is worsening came to the ER. Patient has been also noted that patient has been getting more short of breath last few days.    Clinical Impression  Pt admitted with above diagnosis. Pt currently with functional limitations due to the deficits listed below (see PT Problem List). PTA pt living at home with family, she reports in tears "they are tired of taking care of me". States she has not ambulating in some time and receives assistance for ADLs. Today, limited greatly by pain unable to complete a log roll from supine to side due to pain. Pt will benefit from skilled PT to increase their independence and safety with mobility to allow discharge to the venue listed below.       Follow Up Recommendations SNF;Supervision/Assistance - 24 hour    Equipment Recommendations  (TBD next venue)    Recommendations for Other Services       Precautions / Restrictions Precautions Precautions: Back;Fall Restrictions Weight Bearing Restrictions: No      Mobility  Bed Mobility Overal bed mobility: Needs Assistance Bed Mobility: Rolling Rolling: Mod assist         General bed mobility comments: Assist with rolling to left and right sides to assist with positioning pads in bed.   Transfers Overall transfer level: (pt unable to participate due to back pain)                  Ambulation/Gait                Stairs            Wheelchair Mobility    Modified Rankin (Stroke Patients Only)       Balance                                              Pertinent Vitals/Pain Pain Assessment: 0-10 Pain Score: 8  Pain Location: back Pain Descriptors / Indicators: Discomfort;Grimacing Pain Intervention(s): Limited activity within patient's tolerance    Home Living Family/patient expects to be discharged to:: Private residence Living Arrangements: Spouse/significant other Available Help at Discharge: Family;Available 24 hours/day Type of Home: House Home Access: Level entry     Home Layout: One level Home Equipment: [place] - 2 wheels;Cane - single point;Hand held shower head;Grab bars - tub/shower;Shower seat - built in      Prior Function Level of Independence: Needs assistance   Gait / Transfers Assistance Needed: Uses RW for ambulation  ADL's / Homemaking Assistance Needed: Pt reports husband assists with bathing and dressing as needed.         Hand Dominance        Extremity/Trunk Assessment   Upper Extremity Assessment Upper Extremity Assessment: Generalized weakness    Lower Extremity Assessment Lower Extremity Assessment: Generalized weakness       Communication   Communication: No difficulties  Cognition Arousal/Alertness: Awake/alert Behavior During Therapy: Flat affect Overall Cognitive Status: Within Functional Limits for tasks assessed  General Comments: Pt very tearful during session stating "I hate being here. My family doesn't want me anway." Limited eye contact, looking out of window most of session instead of looking at therapist.      General Comments      Exercises     Assessment/Plan    PT Assessment Patient needs continued PT services  PT Problem List Decreased strength       PT Treatment Interventions Functional mobility training    PT Goals (Current goals can be found in the Care Plan section)  Acute Rehab PT Goals Patient Stated Goal: for pain to improve PT Goal Formulation: With  patient Time For Goal Achievement: 12/06/18 Potential to Achieve Goals: Fair    Frequency Min 2X/week   Barriers to discharge Decreased caregiver support      Co-evaluation               AM-PAC PT "6 Clicks" Mobility  Outcome Measure Help needed turning from your back to your side while in a flat bed without using bedrails?: Total Help needed moving from lying on your back to sitting on the side of a flat bed without using bedrails?: Total Help needed moving to and from a bed to a chair (including a wheelchair)?: Total Help needed standing up from a chair using your arms (e.g., wheelchair or bedside chair)?: Total Help needed to walk in hospital room?: Total Help needed climbing 3-5 steps with a railing? : Total 6 Click Score: 6    End of Session   Activity Tolerance: Patient limited by pain Patient left: in bed;with call bell/phone within reach;with bed alarm set Nurse Communication: Mobility status PT Visit Diagnosis: Unsteadiness on feet (R26.81)    Time: 1045-1110 PT Time Calculation (min) (ACUTE ONLY): 25 min   Charges:   PT Evaluation $PT Eval Moderate Complexity: 1 Mod          Reinaldo Berber, PT, DPT Acute Rehabilitation Services Pager: [phone] Office: [phone]    Reinaldo Berber 11/22/2018, 4:02 PM

## 2018-11-22 NOTE — Plan of Care (Signed)

## 2018-11-22 NOTE — Progress Notes (Signed)
Spoke with pt's son with updates on POC. All questions and concerns were addressed.

## 2018-11-23 LAB — APTT
aPTT: 100 seconds — ABNORMAL HIGH (ref 24–36)
aPTT: 71 seconds — ABNORMAL HIGH (ref 24–36)

## 2018-11-23 LAB — CBC
HCT: 40.4 % (ref 36.0–46.0)
Hemoglobin: 11.7 g/dL — ABNORMAL LOW (ref 12.0–15.0)
MCH: 28.1 pg (ref 26.0–34.0)
MCHC: 29 g/dL — ABNORMAL LOW (ref 30.0–36.0)
MCV: 96.9 fL (ref 80.0–100.0)
Platelets: 209 10*3/uL (ref 150–400)
RBC: 4.17 MIL/uL (ref 3.87–5.11)
RDW: 16.8 % — ABNORMAL HIGH (ref 11.5–15.5)
WBC: 12.3 10*3/uL — ABNORMAL HIGH (ref 4.0–10.5)
nRBC: 0 % (ref 0.0–0.2)

## 2018-11-23 LAB — BASIC METABOLIC PANEL
Anion gap: 8 (ref 5–15)
BUN: 28 mg/dL — ABNORMAL HIGH (ref 8–23)
CO2: 26 mmol/L (ref 22–32)
Calcium: 7.9 mg/dL — ABNORMAL LOW (ref 8.9–10.3)
Chloride: 107 mmol/L (ref 98–111)
Creatinine, Ser: 1.59 mg/dL — ABNORMAL HIGH (ref 0.44–1.00)
GFR calc Af Amer: 34 mL/min — ABNORMAL LOW (ref >60)
GFR calc non Af Amer: 29 mL/min — ABNORMAL LOW (ref >60)
Glucose, Bld: 85 mg/dL (ref 70–99)
Potassium: 5 mmol/L (ref 3.5–5.1)
Sodium: 141 mmol/L (ref 135–145)

## 2018-11-23 LAB — HEMOGLOBIN AND HEMATOCRIT, BLOOD
HCT: 42.8 % (ref 36.0–46.0)
Hemoglobin: 12.8 g/dL (ref 12.0–15.0)

## 2018-11-23 LAB — HEPARIN LEVEL (UNFRACTIONATED): Heparin Unfractionated: 0.46 IU/mL (ref 0.30–0.70)

## 2018-11-23 NOTE — Progress Notes (Signed)
Occupational Therapy Treatment Patient Details Name: Heather Benson MRN: [id number] DOB: [birth year] Today's Date: 11/23/2018    History of present illness 85 y.o. female with history of chronic systolic heart failure paroxysmal atrial fibrillation CAD hypertension hyperlipidemia with previous vertebroplasty's has been experiencing increasing back pain over the last 2 weeks.  Patient is supposed to see orthopedic but since patient pain is worsening came to the ER.  Patient has been also noted that patient has been getting more short of breath last few days.   OT comments  Pt initially agreeable to OT intervention but when asked to perform bed mobility, sit EOB, or attempt transfer pt refused. Pt displays very flat affect throughout session and allowed therapist to assist with repositioning and grooming tasks from bed level but declined other therapeutic intervention.   Follow Up Recommendations  SNF;Supervision/Assistance - 24 hour    Equipment Recommendations  Other (comment)(defer to next venue of care)    Recommendations for Other Services      Precautions / Restrictions Precautions Precautions: Back;Fall Restrictions Weight Bearing Restrictions: No       Mobility Bed Mobility Overal bed mobility: Needs Assistance Bed Mobility: Rolling Rolling: Mod assist         General bed mobility comments: Assist with rolling to reposition.  Transfers    General transfer comment: pt refused secondary to increased pain        ADL either performed or assessed with clinical judgement   ADL       Grooming: Wash/dry face;Wash/dry hands;Supervision/safety;Bed level                  Cognition Arousal/Alertness: Awake/alert Behavior During Therapy: Flat affect Overall Cognitive Status: Within Functional Limits for tasks assessed                 General Comments not able to assess secondary to pt refusal    Pertinent Vitals/ Pain       Pain Assessment: 0-10 Pain  Score: 9  Pain Descriptors / Indicators: Discomfort;Grimacing Pain Intervention(s): Limited activity within patient's tolerance;Monitored during session;Repositioned     Prior Functioning/Environment              Frequency  Min 2X/week        Progress Toward Goals  OT Goals(current goals can now be found in the care plan section)  Progress towards OT goals: Progressing toward goals  Acute Rehab OT Goals Patient Stated Goal: "for pain to be better" OT Goal Formulation: With patient Time For Goal Achievement: 12/07/18 Potential to Achieve Goals: [place] Discharge plan remains appropriate       AM-PAC OT "6 Clicks" Daily Activity     Outcome Measure   Help from another person eating meals?: None Help from another person taking care of personal grooming?: None Help from another person toileting, which includes using toliet, bedpan, or urinal?: Total Help from another person bathing (including washing, rinsing, drying)?: A Lot Help from another person to put on and taking off regular upper body clothing?: A Lot Help from another person to put on and taking off regular lower body clothing?: Total 6 Click Score: 14    End of Session    OT Visit Diagnosis: Pain;Muscle weakness (generalized) (M62.81) Pain - part of body: (back)   Activity Tolerance Patient limited by pain   Patient Left in bed;with call bell/phone within reach           Time: 1345-1400 OT Time Calculation (min): 15 min  Charges: OT General Charges $OT Visit: 1 Visit OT Treatments $Therapeutic Activity: 8-22 mins   Heather Benson P, MS, OTR/L 11/23/2018, 3:05 PM

## 2018-11-23 NOTE — Progress Notes (Signed)
ANTICOAGULATION CONSULT NOTE - Follow Up Consult  Pharmacy Consult for heparin Indication: atrial fibrillation  Allergies  Allergen Reactions  . Augmentin [Amoxicillin-Pot Clavulanate] Diarrhea and Nausea Only  . Boniva [Ibandronic Acid] Diarrhea  . Codeine Other (See Comments)    "Spaced out feeling" and Lightheadedness  . Hydromorphone Nausea And Vomiting  . Latex Rash    "Burns me"  . Lisinopril Cough  . Amoxicillin   . Clavulanic Acid   . Adhesive [Tape] Itching and Rash    Patient Measurements: Height: 5' 6.5" (168.9 cm) Weight: 152 lb 8.9 oz (69.2 kg) IBW/kg (Calculated) : 60.45 Heparin Dosing Weight: 68.9  Vital Signs: Temp: 98.1 F (36.7 C) (07/13 0521) Temp Source: Oral (07/13 0521) BP: 132/68 (07/13 0521) Pulse Rate: 119 (07/13 0521)  Labs: Recent Labs    11/20/18 2220 11/21/18 0046 11/21/18 0517  11/21/18 1612 11/22/18 0354 11/22/18 1310 11/22/18 2219 11/23/18 0337  HGB 13.4  --  12.8  --   --  11.8*  --   --  11.7*  HCT 45.1  --  43.3  --   --  39.0  --   --  40.4  PLT 265  --  271  --   --  226  --   --  209  APTT  --   --   --    < > 37* 87* 59* 100* 71*  HEPARINUNFRC  --   --   --   --  1.14* 0.94*  --   --  0.46  CREATININE 2.09*  --  2.05*  --   --  1.89*  --   --  1.59*  TROPONINIHS 36* 38*  --   --   --   --   --   --   --    < > = values in this interval not displayed.    Estimated Creatinine Clearance: 24.7 mL/min (A) (by C-G formula based on SCr of 1.59 mg/dL (H)).   Medications:  Scheduled:  . amiodarone  200 mg Oral Daily  . atorvastatin  10 mg Oral Daily  . carvedilol  3.125 mg Oral BID  . feeding supplement (ENSURE ENLIVE)  237 mL Oral BID BM  . furosemide  20 mg Oral Daily  . latanoprost  1 drop Both Eyes QHS  . mouth rinse  15 mL Mouth Rinse BID  . mirtazapine  15 mg Oral QHS  . multivitamin  15 mL Oral Daily  . potassium chloride  10 mEq Oral Daily  . predniSONE  10 mg Oral Q breakfast    Assessment: 85 y/o female  c/o back/neck pain x months, found to have compression fracture and PNA; holding Eliquis (last dose 7/10 9am), started heparin for Afib.  Monitoring aPTT daily d/t effect of PTA Apixaban on heparin level. APTT 100>71 on heparin 1150 units/hour  Heparin level therapeutic at 0.46.  H&H stable, PLT WNL, no s/sx of bleed.  Goal of Therapy:  Heparin level 0.3-0.7 units/ml aPTT 66-102 seconds Monitor platelets by anticoagulation protocol: Yes   Plan:  Continue heparin 1150 units/hr Daily aPTT, Heparin Level, and CBC Can likely stop monitoring aPTT after tomorrow since Eliquis washout period will allow heparin level to be more accurate Continue to monitor H&H, platelets, and signs of bleeding. F/U plan to switch back to [place], PharmD PGY1 Ambulatory Care Pharmacy Resident [employer] Phone: [phone] 11/23/2018,11:09 AM

## 2018-11-23 NOTE — Care Management Important Message (Signed)
Important Message  Patient Details  Name: Heather Benson MRN: [id number] Date of Birth: Dec 08, 1931   Medicare Important Message Given:  Yes     Orbie Pyo 11/23/2018, 4:38 PM

## 2018-11-23 NOTE — Progress Notes (Signed)
Called pt's husband back and gave him a brief update. All questions and concerns were addressed.

## 2018-11-23 NOTE — Progress Notes (Signed)
Spoke with pt's husband [name]. Told him that I would call him back when I have updates on POC. Let him know that she was still sleeping.

## 2018-11-23 NOTE — Progress Notes (Signed)
ANTICOAGULATION CONSULT NOTE - Follow Up Consult  Pharmacy Consult for Heparin Indication: atrial fibrillation  Allergies  Allergen Reactions  . Augmentin [Amoxicillin-Pot Clavulanate] Diarrhea and Nausea Only  . Boniva [Ibandronic Acid] Diarrhea  . Codeine Other (See Comments)    "Spaced out feeling" and Lightheadedness  . Hydromorphone Nausea And Vomiting  . Latex Rash    "Burns me"  . Lisinopril Cough  . Amoxicillin   . Clavulanic Acid   . Adhesive [Tape] Itching and Rash    Patient Measurements: Height: 5' 6.5" (168.9 cm) Weight: 151 lb 14.4 oz (68.9 kg) IBW/kg (Calculated) : 60.45 Heparin Dosing Weight: 68.9 kg  Vital Signs: Temp: 97.8 F (36.6 C) (07/12 2111) Temp Source: Oral (07/12 2111) BP: 113/66 (07/12 2111) Pulse Rate: 59 (07/12 2111)  Labs: Recent Labs    11/20/18 2220 11/21/18 0046 11/21/18 0517  11/21/18 1612 11/22/18 0354 11/22/18 1310 11/22/18 2219  HGB 13.4  --  12.8  --   --  11.8*  --   --   HCT 45.1  --  43.3  --   --  39.0  --   --   PLT 265  --  271  --   --  226  --   --   APTT  --   --   --    < > 37* 87* 59* 100*  HEPARINUNFRC  --   --   --   --  1.14* 0.94*  --   --   CREATININE 2.09*  --  2.05*  --   --  1.89*  --   --   TROPONINIHS 36* 38*  --   --   --   --   --   --    < > = values in this interval not displayed.    Estimated Creatinine Clearance: 20.8 mL/min (A) (by C-G formula based on SCr of 1.89 mg/dL (H)).  Assessment: 85yo female c/o back/neck pain x months, found to have compression fracture and PNA; holding Eliquis (last dose 7/10 9am), started heparin for Afib.   APTT this evening 100 sec   Goal of Therapy:  Heparin level 0.3-0.7 units/ml aPTT 66-102 seconds Monitor platelets by anticoagulation protocol: Yes   Plan:  Continue heparin gtt at 1150 units/hour. Will continue to monitor HL, aPTT (while Eliquis affects anti-Xa), and CBC. Thanks for allowing pharmacy to be a part of this patient's care.  Excell Seltzer, PharmD Clinical Pharmacist  11/23/18 1:07 AM

## 2018-11-23 NOTE — Progress Notes (Signed)
PROGRESS NOTE  Heather Benson WUJ:[id number] DOB: 12/22/1931 DOA: 11/20/2018 PCP: [name], MD  Brief History   Heather Benson is a 85 y.o. female with history of chronic systolic heart failure paroxysmal atrial fibrillation CAD hypertension hyperlipidemia with previous vertebroplasty's has been experiencing increasing back pain over the last 2 weeks.  Patient is supposed to see orthopedic but since patient pain is worsening came to the ER.  Patient has been also noted that patient has been getting more short of breath last few days.  ED Course: In the ER patient was found to be febrile and hypoxic with temperature 100 F chest x-ray shows opacity concerning for pneumonia.  Patient had a CT C-spine L-spine and T-spine.  Shows progression of the T12 vertebral body compression fracture with some retropulsion.  Given the fever hypoxia and chest x-ray showing infiltrates patient was started on antibiotics for pneumonia.  Labs showed WBC count of 14 creatinine 2 platelets 265.  The patient was admitted to a medical bed. She has been started on Rocephin and zithromax for a pneumonia. She has been continued on her home medications as possible.   I have discussed the patient with her orthopedic surgeon Dr. Rolena Infante with [facility]. He has recommended that the patient be evaluated by IR for her compression fracture. I have consulted IR. They have ordered a MR of the Lumbar spine evaluating T10 - S1. Consultants  . Interventional Radiology  Procedures  . None  Antibiotics   Anti-infectives (From admission, onward)   Start     Dose/Rate Route Frequency Ordered Stop   11/21/18 2100  cefTRIAXone (ROCEPHIN) 2 g in sodium chloride 0.9 % 100 mL IVPB     2 g 200 mL/hr over 30 Minutes Intravenous Every 24 hours 11/21/18 0421 11/26/18 2059   11/21/18 2100  azithromycin (ZITHROMAX) 500 mg in sodium chloride 0.9 % 250 mL IVPB     500 mg 250 mL/hr over 60 Minutes Intravenous Every 24 hours 11/21/18  0421 11/26/18 2059   11/20/18 2215  cefTRIAXone (ROCEPHIN) 1 g in sodium chloride 0.9 % 100 mL IVPB     1 g 200 mL/hr over 30 Minutes Intravenous  Once 11/20/18 2204 11/20/18 2321   11/20/18 2215  azithromycin (ZITHROMAX) 500 mg in sodium chloride 0.9 % 250 mL IVPB     500 mg 250 mL/hr over 60 Minutes Intravenous  Once 11/20/18 2204 11/20/18 2352     .   Subjective  The patient is resting comfortably. No new complaints.  Objective   Vitals:  Vitals:   11/23/18 0521 11/23/18 1342  BP: 132/68 137/68  Pulse: (!) 119 66  Resp: 16 20  Temp: 98.1 F (36.7 C) (!) 97.5 F (36.4 C)  SpO2: 99% 93%    Exam:  Constitutional:  . The patient is awake, and alert. No acute distress. Respiratory:  . No increased work of breathing.  . No wheezes, rales, or rhonchi. . No tactile fremitus. Cardiovascular:  . Regular rate and rhythm . No murmurs, ectopy, or gallups . No lateral PMI. No thrills. Abdomen:  . Abdomen is soft, non-tender, non-distended. . No hernias, masses, or organomegaly. . Normoactive bowel sounds. Musculoskeletal:  . No cyanosis, clubbing, or edema Skin:  . No rashes, lesions, ulcers . palpation of skin: no induration or nodules Neurologic:  . CN 2-12 intact . Sensation all 4 extremities intact Psychiatric:  . Mental status o Mood, affect appropriate o Orientation to person, place, time   I have  personally reviewed the following:   Today's Data  . Vitals, CMP, CBC  Micro Data  . Blood cultures, urine cultures, COVID-19 negative  Imaging  . CXR   Scheduled Meds: . amiodarone  200 mg Oral Daily  . atorvastatin  10 mg Oral Daily  . carvedilol  3.125 mg Oral BID  . feeding supplement (ENSURE ENLIVE)  237 mL Oral BID BM  . furosemide  20 mg Oral Daily  . latanoprost  1 drop Both Eyes QHS  . mouth rinse  15 mL Mouth Rinse BID  . mirtazapine  15 mg Oral QHS  . multivitamin  15 mL Oral Daily  . potassium chloride  10 mEq Oral Daily  . predniSONE   10 mg Oral Q breakfast   Continuous Infusions: . sodium chloride 10 mL/hr at 11/23/18 0429  . azithromycin Stopped (11/23/18 0435)  . cefTRIAXone (ROCEPHIN)  IV Stopped (11/23/18 0435)  . heparin 1,150 Units/hr (11/23/18 1306)    Principal Problem:   Acute respiratory failure with hypoxia (HCC) Active Problems:   PAF (paroxysmal atrial fibrillation) (HCC)   CKD (chronic kidney disease), stage IV (HCC)   Essential hypertension   CAP (community acquired pneumonia)   Anemia in chronic renal disease   Closed compression fracture of body of L1 vertebra (HCC)   LOS: 3 days    A & P   Acute respiratory failure with hypoxia likely from pneumonia for which patient is on empiric antibiotics follow urine for Legionella strep antigen COVID-19 were negative. Follow blood cultures. Pt continues to require supplemental O2. Slowly improving. Weaning O2 as possible. PT/OT eval and treat.  Increasing back pain with progression of T12 compression:  Discussed with Dr. Rolena Infante orthopedic surgery in the morning. I have discussed the patient with her orthopedic surgeon Dr. Rolena Infante with [facility]. He has recommended that the patient be evaluated by IR for her compression fracture. I have consulted IR. They have ordered a MR of the Lumbar spine evaluating T10 - S1.  History of paroxysmal atrial fibrillation rate controlled.  Rate is controlled. We will keep patient on heparin hold apixaban in anticipation of possible orthopedic procedure.  On Coreg and amiodarone.  Chronic systolic heart failure last EF measured in February 2020 has improved to 35 to 50%.  On Lasix.  Not on ACE inhibitors or ARB due to chronic kidney disease. Monitor volume status.   Chronic kidney disease stage IV: Creatinine appears to be at baseline. Monitor creatinine, electrolytes, and volume status.  Anemia of renal disease follow CBC.  On chronic steroid therapy reason not clear. Sarcoidosis is documented on Epic, but patient  denies this.  I have seen and examined this patient myself. I have spent 38 minutes in her evaluation and care. I have spent more than 50% of this in coordination of care with Dr. Rolena Infante from [facility].  DVT prophylaxis: Heparin. Code Status: DNR confirmed with patient's husband. Family Communication: None available. Disposition Plan: Home.  Salene Mohamud, DO [facility] Direct contact: see [email or web address]  7PM-7AM contact night coverage as above 11/23/2018, 3:18 PM  LOS: 1 day

## 2018-11-23 NOTE — Progress Notes (Signed)
IR requested by Dr. Benny Lennert for possible image-guided T12 kyphoplasty/vertebroplasty.  Case has been reviewed by Dr. Estanislado Pandy who recommends MR lumbar spine (T10 to S1) to further evaluate acuity of fracture prior to proceeding with procedure- imaging scan ordered, will review once results post.  Please call IR with questions/concerns.   Bea Graff Trino Higinbotham, PA-C 11/23/2018, 11:07 AM

## 2018-11-24 ENCOUNTER — Inpatient Hospital Stay (HOSPITAL_COMMUNITY): Payer: Medicare Other

## 2018-11-24 LAB — HEMOGLOBIN AND HEMATOCRIT, BLOOD
HCT: 42.2 % (ref 36.0–46.0)
HCT: 47 % — ABNORMAL HIGH (ref 36.0–46.0)
Hemoglobin: 12.5 g/dL (ref 12.0–15.0)
Hemoglobin: 14.1 g/dL (ref 12.0–15.0)

## 2018-11-24 LAB — CBC
HCT: 42.6 % (ref 36.0–46.0)
Hemoglobin: 12.6 g/dL (ref 12.0–15.0)
MCH: 28 pg (ref 26.0–34.0)
MCHC: 29.6 g/dL — ABNORMAL LOW (ref 30.0–36.0)
MCV: 94.7 fL (ref 80.0–100.0)
Platelets: 236 10*3/uL (ref 150–400)
RBC: 4.5 MIL/uL (ref 3.87–5.11)
RDW: 16.6 % — ABNORMAL HIGH (ref 11.5–15.5)
WBC: 10.6 10*3/uL — ABNORMAL HIGH (ref 4.0–10.5)
nRBC: 0 % (ref 0.0–0.2)

## 2018-11-24 LAB — URINE CULTURE: Culture: 10000 — AB

## 2018-11-24 LAB — HEPARIN LEVEL (UNFRACTIONATED): Heparin Unfractionated: 0.64 IU/mL (ref 0.30–0.70)

## 2018-11-24 LAB — APTT: aPTT: 98 seconds — ABNORMAL HIGH (ref 24–36)

## 2018-11-24 MED ORDER — CIPROFLOXACIN IN D5W 400 MG/200ML IV SOLN
400.0000 mg | INTRAVENOUS | Status: DC
  Administered 2018-11-24 – 2018-11-27 (×4): 400 mg via INTRAVENOUS
  Filled 2018-11-24 (×4): qty 200

## 2018-11-24 MED ORDER — METRONIDAZOLE IN NACL 5-0.79 MG/ML-% IV SOLN
500.0000 mg | Freq: Three times a day (TID) | INTRAVENOUS | Status: DC
  Administered 2018-11-24 – 2018-11-28 (×10): 500 mg via INTRAVENOUS
  Filled 2018-11-24 (×10): qty 100

## 2018-11-24 NOTE — Progress Notes (Signed)
PHARMACY NOTE:  ANTIMICROBIAL RENAL DOSAGE ADJUSTMENT  Current antimicrobial regimen includes a mismatch between antimicrobial dosage and estimated renal function.  As per policy approved by the Pharmacy & Therapeutics and Medical Executive Committees, the antimicrobial dosage will be adjusted accordingly.  Current antimicrobial dosage:  Cipro 400 mg IV every 12 hours  Indication: Diverticulitis with abscess  Renal Function:  Estimated Creatinine Clearance: 24.7 mL/min (A) (by C-G formula based on SCr of 1.59 mg/dL (H)). []      On intermittent HD, scheduled: []      On CRRT    Antimicrobial dosage has been changed to:  Ciprofloxacin 400 mg IV every 24 hours  Additional comments:   Alycia Rossetti, PharmD, BCPS 5:07 PM

## 2018-11-24 NOTE — Progress Notes (Signed)
Spoke with pt's husband with updates. Let him know that pt went down for MRI.

## 2018-11-24 NOTE — NC FL2 (Signed)
[place] LEVEL OF CARE SCREENING TOOL     IDENTIFICATION  Patient Name: Heather Benson Birthdate: 02-19-1932 Sex: female Admission Date (Current Location): 11/20/2018  [facility] and [place] Number:  Herbalist and Address:  [facility], [place] [address], [place], [place] [address]      Provider Number: [id number]  Attending Physician Name and Address:  Karie Kirks, DO  Relative Name and Phone Number:  [name], spouse, [phone]    Current Level of Care: SNF Recommended Level of Care: [place] Prior Approval Number:    Date Approved/Denied:   PASRR Number: [id number]  Discharge Plan: SNF    Current Diagnoses: Patient Active Problem List   Diagnosis Date Noted  . Acute respiratory failure with hypoxia ([place]) 11/20/2018  . Chronic kidney disease-mineral and bone disorder 10/20/2018  . Weight loss 10/20/2018  . Osteoporosis 10/20/2018  . Dilated cardiomyopathy ([place]) 03/20/2018  . Non-ST elevation (NSTEMI) myocardial infarction ([place])   . Sepsis ([place]) 01/26/2018  . Closed compression fracture of body of L1 vertebra ([place]) 10/22/2017  . History of herpes zoster 10/15/2017  . Kidney disease 09/05/2017  . Diverticulitis of colon with hemorrhage 12/25/2016  . Acute lower UTI 12/25/2016  . Aortic atherosclerosis ([place]) 12/25/2016  . Chronic combined systolic and diastolic heart failure ([place])   . CAP (community acquired pneumonia) 12/13/2015  . Elevated troponin 12/13/2015  . Anemia in chronic renal disease 12/13/2015  . Hyponatremia 12/13/2015  . DOE (dyspnea on exertion) 03/06/2015  . Bradycardia 08/23/2014  . Essential hypertension 08/23/2014  . Lightheadedness 08/23/2014  . Fatigue 06/22/2014  . PAF (paroxysmal atrial fibrillation) ([place]) 01/27/2014  . CKD (chronic kidney disease), stage IV ([place]) 01/27/2014  . Swelling of limb 04/01/2012  . Edema leg 04/01/2012    Orientation RESPIRATION BLADDER Height &  Weight     Self  Normal Incontinent Weight: 157 lb (71.2 kg) Height:  5' 6.5" (168.9 cm)  BEHAVIORAL SYMPTOMS/MOOD NEUROLOGICAL BOWEL NUTRITION STATUS      Continent Diet(Please see DC Summary)  AMBULATORY STATUS COMMUNICATION OF NEEDS Skin   Extensive Assist Verbally Normal, Surgical wounds(Spine surgery)                       Personal Care Assistance Level of Assistance  Bathing, Feeding, Dressing Bathing Assistance: Maximum assistance Feeding assistance: Independent Dressing Assistance: Maximum assistance     Functional Limitations Info  Sight, Hearing, Speech Sight Info: Adequate Hearing Info: Adequate Speech Info: Adequate    SPECIAL CARE FACTORS FREQUENCY  PT (By licensed PT), OT (By licensed OT)     PT Frequency: 5x/week OT Frequency: 3x/week            Contractures Contractures Info: Not present    Additional Factors Info  Code Status, Allergies Code Status Info: DNR Allergies Info: Augmentin (Amoxicillin-pot Clavulanate), Boniva (Ibandronic Acid), Codeine, Hydromorphone, Latex, Lisinopril, Amoxicillin, Clavulanic Acid, Adhesive (Tape)           Current Medications (11/24/2018):  This is the current hospital active medication list Current Facility-Administered Medications  Medication Dose Route Frequency Provider Last Rate Last Dose  . 0.9 %  sodium chloride infusion   Intravenous PRN Swayze, Ava, DO 10 mL/hr at 11/24/18 0700    . acetaminophen (TYLENOL) tablet 650 mg  650 mg Oral Q6H PRN Rise Patience, MD       Or  . acetaminophen (TYLENOL) suppository 650 mg  650 mg Rectal Q6H PRN  Rise Patience, MD      . amiodarone (PACERONE) tablet 200 mg  200 mg Oral Daily Rise Patience, MD   200 mg at 11/24/18 1610  . atorvastatin (LIPITOR) tablet 10 mg  10 mg Oral Daily Rise Patience, MD   10 mg at 11/24/18 0903  . azithromycin (ZITHROMAX) 500 mg in sodium chloride 0.9 % 250 mL IVPB  500 mg Intravenous Q24H Rise Patience, MD  250 mL/hr at 11/23/18 2300    . carvedilol (COREG) tablet 3.125 mg  3.125 mg Oral BID Rise Patience, MD   3.125 mg at 11/24/18 0902  . cefTRIAXone (ROCEPHIN) 2 g in sodium chloride 0.9 % 100 mL IVPB  2 g Intravenous Q24H Rise Patience, MD   Stopped at 11/23/18 2106  . feeding supplement (ENSURE ENLIVE) (ENSURE ENLIVE) liquid 237 mL  237 mL Oral BID BM Rise Patience, MD   237 mL at 11/24/18 0904  . furosemide (LASIX) tablet 20 mg  20 mg Oral Daily Rise Patience, MD   20 mg at 11/24/18 0903  . heparin ADULT infusion 100 units/mL (25000 units/229mL sodium chloride 0.45%)  1,150 Units/hr Intravenous Continuous Pham, Mimi L, RPH 11.5 mL/hr at 11/24/18 0700 1,150 Units/hr at 11/24/18 0700  . HYDROcodone-acetaminophen (NORCO/VICODIN) 5-325 MG per tablet 1 tablet  1 tablet Oral BID PRN Rise Patience, MD   1 tablet at 11/23/18 1406  . latanoprost (XALATAN) 0.005 % ophthalmic solution 1 drop  1 drop Both Eyes QHS Rise Patience, MD   1 drop at 11/23/18 2225  . MEDLINE mouth rinse  15 mL Mouth Rinse BID Rise Patience, MD   15 mL at 11/24/18 0903  . mirtazapine (REMERON) tablet 15 mg  15 mg Oral QHS Rise Patience, MD   15 mg at 11/23/18 2223  . multivitamin liquid 15 mL  15 mL Oral Daily Swayze, Ava, DO   15 mL at 11/24/18 0903  . nitroGLYCERIN (NITROSTAT) SL tablet 0.4 mg  0.4 mg Sublingual Q5 min PRN Rise Patience, MD      . ondansetron [facility]) tablet 4 mg  4 mg Oral Q6H PRN Rise Patience, MD       Or  . ondansetron [facility]) injection 4 mg  4 mg Intravenous Q6H PRN Rise Patience, MD      . polyethylene glycol (MIRALAX / GLYCOLAX) packet 17 g  17 g Oral Daily PRN Rise Patience, MD      . potassium chloride (K-DUR) CR tablet 10 mEq  10 mEq Oral Daily Rise Patience, MD   10 mEq at 11/24/18 0903  . predniSONE (DELTASONE) tablet 10 mg  10 mg Oral Q breakfast Rise Patience, MD   10 mg at 11/24/18 9604     Discharge  Medications: Please see discharge summary for a list of discharge medications.  Relevant Imaging Results:  Relevant Lab Results:   Additional Information SSN: [id number]   COVID negative on 7/10  Benard Halsted, LCSW

## 2018-11-24 NOTE — Progress Notes (Signed)
PT Cancellation Note  Patient Details Name: Heather Benson MRN: [id number] DOB: 11/11/1931   Cancelled Treatment:     Attempted to work with patient 3x. On the first occasion  the patient asked that the therapist come back after lunch 2nd to recent MRI. On the second occasion she was wet and hoped to eat lunch before it got cold. On the third attempt she was getting a bath.    Carney Living PT DPT  11/24/2018, 4:15 PM

## 2018-11-24 NOTE — TOC Initial Note (Signed)
Transition of Care [facility]) - Initial/Assessment Note    Patient Details  Name: Heather Benson MRN: [id number] Date of Birth: 02-09-1932  Transition of Care [facility]) CM/SW Contact:    Benard Halsted, LCSW Phone Number: 11/24/2018, 5:03 PM  Clinical Narrative:                 CSW received consult for possible SNF placement at time of discharge. CSW spoke with patient's spouse regarding PT recommendation of SNF placement at time of discharge. Patient's spouse reported that he is currently unable to care for patient at their home given patient's current physical needs and fall risk. Patient's spouse expressed understanding of PT recommendation and is agreeable to SNF placement at time of discharge. Patient's spouse reports preference for [facility] since patient always does her rehab there. [facility] is able to accept patient when medically stable. CSW discussed insurance authorization process and provided Medicare SNF ratings list. Patient expressed being hopeful for rehab and to feel better soon. No further questions reported at this time. CSW to continue to follow and assist with discharge planning needs.   Expected Discharge Plan: Skilled Nursing Facility Barriers to Discharge: Continued Medical Work up   Patient Goals and CMS Choice Patient states their goals for this hospitalization and ongoing recovery are:: to get better CMS Medicare.gov Compare Post Acute Care list provided to:: Patient Represenative (must comment)(Spouse)    Expected Discharge Plan and Services Expected Discharge Plan: [place] In-house Referral: Clinical Social Work Discharge Planning Services: CM Consult Post Acute Care Choice: [place] arrangements for the past 2 months: Single Family Home                 DME Arranged: N/A(owns Air traffic controller) DME Agency: NA       HH Arranged: NA [place] Agency: NA        Prior Living Arrangements/Services Living arrangements  for the past 2 months: [place] Lives with:: Spouse Patient language and need for interpreter reviewed:: Yes Do you feel safe going back to the place where you live?: Yes      Need for Family Participation in Patient Care: Yes (Comment) Care giver support system in place?: Yes (comment) Current home services: DME Criminal Activity/Legal Involvement Pertinent to Current Situation/Hospitalization: No - Comment as needed  Activities of Daily Living Home Assistive Devices/Equipment: Dentures (specify type), Eyeglasses, Shower chair with back, Raised toilet seat with rails, Walker (specify type)(four wheel walker, upper dentures) ADL Screening (condition at time of admission) Patient's cognitive ability adequate to safely complete daily activities?: Yes Is the patient deaf or have difficulty hearing?: No Does the patient have difficulty seeing, even when wearing glasses/contacts?: No Does the patient have difficulty concentrating, remembering, or making decisions?: No Patient able to express need for assistance with ADLs?: Yes Does the patient have difficulty dressing or bathing?: No Independently performs ADLs?: Yes (appropriate for developmental age) Does the patient have difficulty walking or climbing stairs?: Yes Weakness of Legs: Both Weakness of Arms/Hands: Both  Permission Sought/Granted Permission sought to share information with : Case Manager, Family Supports Permission granted to share information with : Yes, Verbal Permission Granted  Share Information with NAME: [name]  Permission granted to share info w AGENCY: SNFs  Permission granted to share info w Relationship: Spouse  Permission granted to share info w Contact Information: [phone]  Emotional Assessment Appearance:: Appears stated age Attitude/Demeanor/Rapport: Gracious Affect (typically observed): Accepting Orientation: : Oriented to Self, Oriented to Place,  Fluctuating Orientation (Suspected and/or  reported Sundowners) Alcohol / Substance Use: Not Applicable Psych Involvement: No (comment)  Admission diagnosis:  Pneumonia of left lower lobe due to infectious organism (HCC) [J18.1] Chronic midline back pain, unspecified back location [M54.9, G89.29] Sepsis, due to unspecified organism, unspecified whether acute organ dysfunction present ([place]) [A41.9] Acute respiratory failure with hypoxia ([place]) [J96.01] Patient Active Problem List   Diagnosis Date Noted  . Acute respiratory failure with hypoxia ([place]) 11/20/2018  . Chronic kidney disease-mineral and bone disorder 10/20/2018  . Weight loss 10/20/2018  . Osteoporosis 10/20/2018  . Dilated cardiomyopathy ([place]) 03/20/2018  . Non-ST elevation (NSTEMI) myocardial infarction ([place])   . Sepsis ([place]) 01/26/2018  . Closed compression fracture of body of L1 vertebra ([place]) 10/22/2017  . History of herpes zoster 10/15/2017  . Kidney disease 09/05/2017  . Diverticulitis of colon with hemorrhage 12/25/2016  . Acute lower UTI 12/25/2016  . Aortic atherosclerosis ([place]) 12/25/2016  . Chronic combined systolic and diastolic heart failure ([place])   . CAP (community acquired pneumonia) 12/13/2015  . Elevated troponin 12/13/2015  . Anemia in chronic renal disease 12/13/2015  . Hyponatremia 12/13/2015  . DOE (dyspnea on exertion) 03/06/2015  . Bradycardia 08/23/2014  . Essential hypertension 08/23/2014  . Lightheadedness 08/23/2014  . Fatigue 06/22/2014  . PAF (paroxysmal atrial fibrillation) ([place]) 01/27/2014  . CKD (chronic kidney disease), stage IV ([place]) 01/27/2014  . Swelling of limb 04/01/2012  . Edema leg 04/01/2012   PCP:  [name], MD Pharmacy:   [id number] - [place], [address] [place] [address] Phone: [phone] Fax: [phone]     Social Determinants of Health (SDOH) Interventions    Readmission Risk Interventions Readmission Risk Prevention Plan 11/24/2018  Transportation  Screening Complete  PCP or Specialist Appt within 3-5 Days Complete  HRI or [place] Complete  Social Work Consult for [place] Planning/Counseling Complete  Palliative Care Screening Not Applicable  Medication Review Press photographer) Complete  Some recent data might be hidden

## 2018-11-24 NOTE — Progress Notes (Addendum)
Patient ID: Heather Benson, female   DOB: 11-Aug-1931, 85 y.o.   MRN: [id number]   Request received for KP  MR today:  IMPRESSION: 1. L5 acute or early subacute compression fracture, suspect this is the most symptomatic level. 2. Remote T12 compression fracture with similar height loss to September 2019 and only mild residual edema. 3. Interval but nonacute bilateral sacral ala and S2 insufficiency fractures. 4. Additional incidental finding on lumbar spine CT from 4 days ago, there is a partially covered mass in the left hemipelvis favoring diverticular abscess. Recommend abdomen and pelvis CT.  Dr Estanislado Pandy has reviewed this imaging He approves L5 level for KP But Must have treatment of diverticular abscess prior to KP procedure per Dr Estanislado Pandy  KP can be done as OP if MD feels best- most appropriate Will need Insurance approval-- IR scheduler will call for this  Spoke to Dr Benny Lennert She will order CT Abd/Pelvis May need to consult [facility] for opinion She is agreeable

## 2018-11-24 NOTE — Progress Notes (Signed)
Spoke with pt's son Merry Proud. Told him that I would page the MD and ask her to give him a call.

## 2018-11-24 NOTE — Progress Notes (Signed)
ANTICOAGULATION CONSULT NOTE - Follow Up Consult  Pharmacy Consult for heparin Indication: atrial fibrillation  Allergies  Allergen Reactions  . Augmentin [Amoxicillin-Pot Clavulanate] Diarrhea and Nausea Only  . Boniva [Ibandronic Acid] Diarrhea  . Codeine Other (See Comments)    "Spaced out feeling" and Lightheadedness  . Hydromorphone Nausea And Vomiting  . Latex Rash    "Burns me"  . Lisinopril Cough  . Amoxicillin   . Clavulanic Acid   . Adhesive [Tape] Itching and Rash    Patient Measurements: Height: 5' 6.5" (168.9 cm) Weight: 157 lb (71.2 kg) IBW/kg (Calculated) : 60.45 Heparin Dosing Weight: 68.9  Vital Signs: Temp: 98.2 F (36.8 C) (07/14 0712) Temp Source: Oral (07/14 0712) BP: 127/66 (07/14 0712) Pulse Rate: 66 (07/14 0712)  Labs: Recent Labs    11/22/18 0354  11/22/18 2219 11/23/18 0337 11/23/18 1844 11/24/18 0225  HGB 11.8*  --   --  11.7* 12.8 12.6  HCT 39.0  --   --  40.4 42.8 42.6  PLT 226  --   --  209  --  236  APTT 87*   < > 100* 71*  --  98*  HEPARINUNFRC 0.94*  --   --  0.46  --  0.64  CREATININE 1.89*  --   --  1.59*  --   --    < > = values in this interval not displayed.    Estimated Creatinine Clearance: 24.7 mL/min (A) (by C-G formula based on SCr of 1.59 mg/dL (H)).   Medications:  Scheduled:  . amiodarone  200 mg Oral Daily  . atorvastatin  10 mg Oral Daily  . carvedilol  3.125 mg Oral BID  . feeding supplement (ENSURE ENLIVE)  237 mL Oral BID BM  . furosemide  20 mg Oral Daily  . latanoprost  1 drop Both Eyes QHS  . mouth rinse  15 mL Mouth Rinse BID  . mirtazapine  15 mg Oral QHS  . multivitamin  15 mL Oral Daily  . potassium chloride  10 mEq Oral Daily  . predniSONE  10 mg Oral Q breakfast    Assessment: 85 y/o female c/o back/neck pain x months, found to have compression fracture and PNA; holding Eliquis (last dose 7/10 9am), started heparin for Afib. Dr. Benny Lennert would like to continue heparin and hold Eliquis in  anticipation of possible kyphoplasty/vertebroplasty.  APTT 71>98 on heparin 1150 units/hour  Heparin level therapeutic at 0.64.  H&H stable, PLT WNL, no s/sx of bleed.  Goal of Therapy:  Heparin level 0.3-0.7 units/ml aPTT 66-102 seconds Monitor platelets by anticoagulation protocol: Yes   Plan:  -Continue heparin 1150 units/hr -Daily Heparin Level and CBC -No longer monitoring aPTT as of 7/15 since Eliquis washout period is complete and heparin levels correlate with anticoagulation now -Continue to monitor H&H, platelets, and signs of bleeding -F/U plan to switch back to PO Eliquis after possible kyphoplasty/vertebroplasty -F/U heparin hold plan if procedure is done  Kennon Holter, PharmD PGY1 Ambulatory Care Pharmacy Resident [place] Phone: [phone] 11/24/2018,9:23 AM

## 2018-11-24 NOTE — Progress Notes (Signed)
PROGRESS NOTE  Heather Benson IWP:[id number] DOB: [birth year] DOA: 11/20/2018 PCP: [name], MD  Brief History   Heather Benson is a 85 y.o. female with history of chronic systolic heart failure paroxysmal atrial fibrillation CAD hypertension hyperlipidemia with previous vertebroplasty's has been experiencing increasing back pain over the last 2 weeks.  Patient is supposed to see orthopedic but since patient pain is worsening came to the ER.  Patient has been also noted that patient has been getting more short of breath last few days.  ED Course: In the ER patient was found to be febrile and hypoxic with temperature 100 F chest x-ray shows opacity concerning for pneumonia.  Patient had a CT C-spine L-spine and T-spine.  Shows progression of the T12 vertebral body compression fracture with some retropulsion.  Given the fever hypoxia and chest x-ray showing infiltrates patient was started on antibiotics for pneumonia.  Labs showed WBC count of 14 creatinine 2 platelets 265.  The patient was admitted to a medical bed. She has been started on Rocephin and zithromax for a pneumonia. She has been continued on her home medications as possible.   I have discussed the patient with her orthopedic surgeon Dr. Rolena Infante with [facility]. He has recommended that the patient be evaluated by IR for her compression fracture. I have consulted IR. An MR of the Lumbar spine evaluating T10 - S1 was performed. I have discussed the results with IR. It seems that there is a compression fracture at L5 that they feel is more likely the source of her pain. They also have identified diverticulitis in the rectosigmoid with an adjacent area of abscess. I have discussed possible percutaneous drainage with IR. They have requested a CT abdomen and pelvis first. This has been ordered and the patient's antibiotics have been changed to cipro and flagyl to better cover for enteric pathogen. Consultants  . Interventional  Radiology  Procedures  . None  Antibiotics   Anti-infectives (From admission, onward)   Start     Dose/Rate Route Frequency Ordered Stop   11/24/18 1530  ciprofloxacin (CIPRO) IVPB 400 mg     400 mg 200 mL/hr over 60 Minutes Intravenous Every 12 hours 11/24/18 1516     11/24/18 1530  metroNIDAZOLE (FLAGYL) IVPB 500 mg     500 mg 100 mL/hr over 60 Minutes Intravenous Every 8 hours 11/24/18 1516     11/21/18 2100  cefTRIAXone (ROCEPHIN) 2 g in sodium chloride 0.9 % 100 mL IVPB  Status:  Discontinued     2 g 200 mL/hr over 30 Minutes Intravenous Every 24 hours 11/21/18 0421 11/24/18 1602   11/21/18 2100  azithromycin (ZITHROMAX) 500 mg in sodium chloride 0.9 % 250 mL IVPB  Status:  Discontinued     500 mg 250 mL/hr over 60 Minutes Intravenous Every 24 hours 11/21/18 0421 11/24/18 1602   11/20/18 2215  cefTRIAXone (ROCEPHIN) 1 g in sodium chloride 0.9 % 100 mL IVPB     1 g 200 mL/hr over 30 Minutes Intravenous  Once 11/20/18 2204 11/20/18 2321   11/20/18 2215  azithromycin (ZITHROMAX) 500 mg in sodium chloride 0.9 % 250 mL IVPB     500 mg 250 mL/hr over 60 Minutes Intravenous  Once 11/20/18 2204 11/20/18 2352     .   Subjective  The patient is resting comfortably. No new complaints.  Objective   Vitals:  Vitals:   11/24/18 0712 11/24/18 1240  BP: 127/66 (!) 100/47  Pulse: 66 (!) 53  Resp:  16  Temp: 98.2 F (36.8 C) (!) 97.5 F (36.4 C)  SpO2: 100% 97%   Exam:  Constitutional:  . The patient is awake, and alert. No acute distress. Respiratory:  . No increased work of breathing.  . No wheezes, rales, or rhonchi. . No tactile fremitus. Cardiovascular:  . Regular rate and rhythm . No murmurs, ectopy, or gallups . No lateral PMI. No thrills. Abdomen:  . Abdomen is soft, non-distended. There is tenderness in the patient's left lower quadrant. . No hernias, masses, or organomegaly. . Normoactive bowel sounds. Musculoskeletal:  . No cyanosis, clubbing, or edema  Skin:  . No rashes, lesions, ulcers . palpation of skin: no induration or nodules Neurologic:  . CN 2-12 intact . Sensation all 4 extremities intact Psychiatric:  . Mental status o Mood, affect appropriate o Orientation to person, place, time   I have personally reviewed the following:   Today's Data  . Vitals, CMP, CBC  Micro Data  . Urine cultures are positive for E. Coli. , Blood cultures have had no growth, COVID-19 negative  Imaging  . CXR, MRI of L spine   Scheduled Meds: . amiodarone  200 mg Oral Daily  . atorvastatin  10 mg Oral Daily  . carvedilol  3.125 mg Oral BID  . feeding supplement (ENSURE ENLIVE)  237 mL Oral BID BM  . furosemide  20 mg Oral Daily  . latanoprost  1 drop Both Eyes QHS  . mouth rinse  15 mL Mouth Rinse BID  . mirtazapine  15 mg Oral QHS  . multivitamin  15 mL Oral Daily  . potassium chloride  10 mEq Oral Daily  . predniSONE  10 mg Oral Q breakfast   Continuous Infusions: . sodium chloride 10 mL/hr at 11/24/18 0700  . ciprofloxacin    . heparin 1,150 Units/hr (11/24/18 1022)  . metronidazole      Principal Problem:   Acute respiratory failure with hypoxia (HCC) Active Problems:   PAF (paroxysmal atrial fibrillation) (HCC)   CKD (chronic kidney disease), stage IV (HCC)   Essential hypertension   CAP (community acquired pneumonia)   Anemia in chronic renal disease   Closed compression fracture of body of L1 vertebra (HCC)   LOS: 4 days    A & P   Acute respiratory failure with hypoxia likely from pneumonia for which patient is on empiric antibiotics follow urine for Legionella strep antigen COVID-19 were negative. Follow blood cultures. Pt continues to require supplemental O2. Slowly improving. Weaning O2 as possible. PT/OT eval and treat.  Increasing back pain with progression of T12 compression:  Discussed with Dr. Rolena Infante orthopedic surgery in the morning. I have discussed the patient with her orthopedic surgeon Dr. Rolena Infante  with [facility]. He has recommended that the patient be evaluated by IR for her compression fracture. I have consulted IR.   An MR of the Lumbar spine evaluating T10 - S1 was performed. I have discussed the results with IR. It seems that there is a compression fracture at L5 that they feel is more likely the source of her pain.   Diverticulitis with abscess: The MRI also demonstrated diverticulitis in the rectosigmoid with an adjacent area of abscess. I have discussed possible percutaneous drainage with IR. They have requested a CT abdomen and pelvis first. This has been ordered and the patient's antibiotics have been changed to cipro and flagyl to better cover for enteric pathogen.  History of paroxysmal atrial fibrillation rate controlled.  Rate is controlled. We will keep patient on heparin hold apixaban in anticipation of possible orthopedic procedure.  On Coreg and amiodarone.  Chronic systolic heart failure last EF measured in February 2020 has improved to 35 to 50%.  On Lasix.  Not on ACE inhibitors or ARB due to chronic kidney disease. Monitor volume status.   Chronic kidney disease stage IV: Creatinine appears to be at baseline. Monitor creatinine, electrolytes, and volume status.  Anemia of renal disease follow CBC.  On chronic steroid therapy reason not clear. Sarcoidosis is documented on Epic, but patient denies this.  I have seen and examined this patient myself. I have spent 38 minutes in her evaluation and care. I have spent more than 50% of this in coordination of care with interventional radiology. I have also discussed the patient in detail with the patient's son, Merry Proud. All questions answered to the best of my ability.  DVT prophylaxis: Heparin. Code Status: DNR confirmed with patient's husband. Family Communication: None available. Disposition Plan: Home.  Brenetta Penny, DO [facility] Direct contact: see [email or web address]  7PM-7AM contact night coverage as above  11/24/2018, 4:15 PM  LOS: 1 day

## 2018-11-25 ENCOUNTER — Encounter (HOSPITAL_COMMUNITY): Payer: Self-pay | Admitting: General Surgery

## 2018-11-25 ENCOUNTER — Inpatient Hospital Stay (HOSPITAL_COMMUNITY): Payer: Medicare Other

## 2018-11-25 LAB — CULTURE, BLOOD (ROUTINE X 2)
Culture: NO GROWTH
Culture: NO GROWTH
Special Requests: ADEQUATE

## 2018-11-25 LAB — BASIC METABOLIC PANEL
Anion gap: 11 (ref 5–15)
BUN: 21 mg/dL (ref 8–23)
CO2: 25 mmol/L (ref 22–32)
Calcium: 8.8 mg/dL — ABNORMAL LOW (ref 8.9–10.3)
Chloride: 100 mmol/L (ref 98–111)
Creatinine, Ser: 1.53 mg/dL — ABNORMAL HIGH (ref 0.44–1.00)
GFR calc Af Amer: 36 mL/min — ABNORMAL LOW (ref >60)
GFR calc non Af Amer: 31 mL/min — ABNORMAL LOW (ref >60)
Glucose, Bld: 80 mg/dL (ref 70–99)
Potassium: 4.9 mmol/L (ref 3.5–5.1)
Sodium: 136 mmol/L (ref 135–145)

## 2018-11-25 LAB — PROTIME-INR
INR: 1.1 (ref 0.8–1.2)
Prothrombin Time: 14.1 seconds (ref 11.4–15.2)

## 2018-11-25 LAB — CBC WITH DIFFERENTIAL/PLATELET
Abs Immature Granulocytes: 0.11 10*3/uL — ABNORMAL HIGH (ref 0.00–0.07)
Basophils Absolute: 0 10*3/uL (ref 0.0–0.1)
Basophils Relative: 0 %
Eosinophils Absolute: 0 10*3/uL (ref 0.0–0.5)
Eosinophils Relative: 0 %
HCT: 45.6 % (ref 36.0–46.0)
Hemoglobin: 13.8 g/dL (ref 12.0–15.0)
Immature Granulocytes: 1 %
Lymphocytes Relative: 22 %
Lymphs Abs: 2.6 10*3/uL (ref 0.7–4.0)
MCH: 28.1 pg (ref 26.0–34.0)
MCHC: 30.3 g/dL (ref 30.0–36.0)
MCV: 92.9 fL (ref 80.0–100.0)
Monocytes Absolute: 0.8 10*3/uL (ref 0.1–1.0)
Monocytes Relative: 6 %
Neutro Abs: 8.4 10*3/uL — ABNORMAL HIGH (ref 1.7–7.7)
Neutrophils Relative %: 71 %
Platelets: 229 10*3/uL (ref 150–400)
RBC: 4.91 MIL/uL (ref 3.87–5.11)
RDW: 16.4 % — ABNORMAL HIGH (ref 11.5–15.5)
WBC: 11.9 10*3/uL — ABNORMAL HIGH (ref 4.0–10.5)
nRBC: 0 % (ref 0.0–0.2)

## 2018-11-25 LAB — HEMOGLOBIN AND HEMATOCRIT, BLOOD
HCT: 43 % (ref 36.0–46.0)
HCT: 44 % (ref 36.0–46.0)
Hemoglobin: 12.7 g/dL (ref 12.0–15.0)
Hemoglobin: 13.3 g/dL (ref 12.0–15.0)

## 2018-11-25 LAB — HEPARIN LEVEL (UNFRACTIONATED): Heparin Unfractionated: 0.55 IU/mL (ref 0.30–0.70)

## 2018-11-25 MED ORDER — SODIUM CHLORIDE 0.9% FLUSH
10.0000 mL | INTRAVENOUS | Status: DC | PRN
  Administered 2018-12-02 – 2018-12-04 (×2): 10 mL
  Filled 2018-11-25 (×2): qty 40

## 2018-11-25 MED ORDER — SODIUM CHLORIDE 0.9% FLUSH
10.0000 mL | Freq: Two times a day (BID) | INTRAVENOUS | Status: DC
  Administered 2018-11-25 – 2018-12-05 (×13): 10 mL

## 2018-11-25 NOTE — Progress Notes (Signed)
PROGRESS NOTE  [name]:[id number] DOB: 03-03-1932 DOA: 11/20/2018 PCP: [name], MD  Brief History   Heather Benson is a 85 y.o. female with history of chronic systolic heart failure paroxysmal atrial fibrillation CAD hypertension hyperlipidemia with previous vertebroplasty's has been experiencing increasing back pain over the last 2 weeks.  Patient is supposed to see orthopedic but since patient pain is worsening came to the ER.  Patient has been also noted that patient has been getting more short of breath last few days.  ED Course: In the ER patient was found to be febrile and hypoxic with temperature 100 F chest x-ray shows opacity concerning for pneumonia.  Patient had a CT C-spine L-spine and T-spine.  Shows progression of the T12 vertebral body compression fracture with some retropulsion.  Given the fever hypoxia and chest x-ray showing infiltrates patient was started on antibiotics for pneumonia.  Labs showed WBC count of 14 creatinine 2 platelets 265.  The patient was admitted to a medical bed. She has been started on Rocephin and zithromax for a pneumonia. She has been continued on her home medications as possible.   I have discussed the patient with her orthopedic surgeon Dr. Rolena Infante with [facility]. He has recommended that the patient be evaluated by IR for her compression fracture. I have consulted IR. An MR of the Lumbar spine evaluating T10 - S1 was performed. I have discussed the results with IR. It seems that there is a compression fracture at L5 that they feel is more likely the source of her pain. They also have identified diverticulitis in the rectosigmoid with an adjacent area of abscess. I have discussed possible percutaneous drainage with IR. They have requested a CT abdomen and pelvis first. This has been ordered and the patient's antibiotics have been changed to cipro and flagyl to better cover for enteric pathogen. CT abdomen and pelvis again  demonstrated the diverticulitis and abscess. General surgery was consulted to evaluate the patient. They will contact IR to see if they can percutaneously drain the abscess. Consultants  . Interventional Radiology . General Surgery  Procedures  . None  Antibiotics   Anti-infectives (From admission, onward)   Start     Dose/Rate Route Frequency Ordered Stop   11/24/18 1800  ciprofloxacin (CIPRO) IVPB 400 mg     400 mg 200 mL/hr over 60 Minutes Intravenous Every 24 hours 11/24/18 1516     11/24/18 1800  metroNIDAZOLE (FLAGYL) IVPB 500 mg     500 mg 100 mL/hr over 60 Minutes Intravenous Every 8 hours 11/24/18 1516     11/21/18 2100  cefTRIAXone (ROCEPHIN) 2 g in sodium chloride 0.9 % 100 mL IVPB  Status:  Discontinued     2 g 200 mL/hr over 30 Minutes Intravenous Every 24 hours 11/21/18 0421 11/24/18 1602   11/21/18 2100  azithromycin (ZITHROMAX) 500 mg in sodium chloride 0.9 % 250 mL IVPB  Status:  Discontinued     500 mg 250 mL/hr over 60 Minutes Intravenous Every 24 hours 11/21/18 0421 11/24/18 1602   11/20/18 2215  cefTRIAXone (ROCEPHIN) 1 g in sodium chloride 0.9 % 100 mL IVPB     1 g 200 mL/hr over 30 Minutes Intravenous  Once 11/20/18 2204 11/20/18 2321   11/20/18 2215  azithromycin (ZITHROMAX) 500 mg in sodium chloride 0.9 % 250 mL IVPB     500 mg 250 mL/hr over 60 Minutes Intravenous  Once 11/20/18 2204 11/20/18 2352     .  Subjective  The patient is resting comfortably. No new complaints.  Objective   Vitals:  Vitals:   11/25/18 0531 11/25/18 0625  BP: (!) 130/57 120/74  Pulse: 66 75  Resp: 16   Temp: 98.8 F (37.1 C) 98.5 F (36.9 C)  SpO2: 93% 100%   Exam:  Constitutional:  . The patient is awake, and alert. No acute distress. Respiratory:  . No increased work of breathing.  . No wheezes, rales, or rhonchi. . No tactile fremitus. Cardiovascular:  . Regular rate and rhythm . No murmurs, ectopy, or gallups . No lateral PMI. No thrills. Abdomen:   . Abdomen is soft, non-distended. There is mild tenderness in the patient's left lower quadrant. . No hernias, masses, or organomegaly. . Normoactive bowel sounds. Musculoskeletal:  . No cyanosis, clubbing, or edema Skin:  . No rashes, lesions, ulcers . palpation of skin: no induration or nodules Neurologic:  . CN 2-12 intact . Sensation all 4 extremities intact Psychiatric:  . Mental status o Mood, affect appropriate o Orientation to person, place, time   I have personally reviewed the following:   Today's Data  . Vitals, CMP, CBC  Micro Data  . Urine cultures are positive for E. Coli. , Blood cultures have had no growth, COVID-19 negative  Imaging  . CXR, MRI of L spine, CTa abdomen and pelvis   Scheduled Meds: . amiodarone  200 mg Oral Daily  . atorvastatin  10 mg Oral Daily  . carvedilol  3.125 mg Oral BID  . feeding supplement (ENSURE ENLIVE)  237 mL Oral BID BM  . furosemide  20 mg Oral Daily  . latanoprost  1 drop Both Eyes QHS  . mouth rinse  15 mL Mouth Rinse BID  . mirtazapine  15 mg Oral QHS  . multivitamin  15 mL Oral Daily  . potassium chloride  10 mEq Oral Daily  . predniSONE  10 mg Oral Q breakfast   Continuous Infusions: . sodium chloride 500 mL (11/25/18 1032)  . ciprofloxacin Stopped (11/24/18 1900)  . heparin 1,150 Units/hr (11/25/18 0851)  . metronidazole 500 mg (11/25/18 1037)    Principal Problem:   Acute respiratory failure with hypoxia (HCC) Active Problems:   PAF (paroxysmal atrial fibrillation) (HCC)   CKD (chronic kidney disease), stage IV (HCC)   Essential hypertension   CAP (community acquired pneumonia)   Anemia in chronic renal disease   Closed compression fracture of body of L1 vertebra (HCC)   LOS: 5 days    A & P   Acute respiratory failure with hypoxia likely from pneumonia for which patient is on empiric antibiotics follow urine for Legionella strep antigen COVID-19 were negative. Follow blood cultures. Pt continues  to require supplemental O2. Slowly improving. Weaning O2 as possible. PT/OT eval and treat.  Increasing back pain with progression of T12 compression:  Discussed with Dr. Rolena Infante orthopedic surgery in the morning. I have discussed the patient with her orthopedic surgeon Dr. Rolena Infante with [facility]. He has recommended that the patient be evaluated by IR for her compression fracture. I have consulted IR.   An MR of the Lumbar spine evaluating T10 - S1 was performed. I have discussed the results with IR. It seems that there is a compression fracture at L5 that they feel is more likely the source of her pain.  This will be addressed after infectious issues have been addressed.  Diverticulitis with abscess: The MRI also demonstrated diverticulitis in the rectosigmoid with an adjacent area  of abscess. I have discussed possible percutaneous drainage with IR. They have requested a CT abdomen and pelvis first. This has been ordered and the patient's antibiotics have been changed to cipro and flagyl to better cover for enteric pathogen. General surgery has evaluated the patient and will discuss the patient with IR regarding percutaneous drainage of the abscess.  History of paroxysmal atrial fibrillation rate controlled.  Rate is controlled. We will keep patient on heparin hold apixaban in anticipation of possible orthopedic procedure.  On Coreg and amiodarone.  Chronic systolic heart failure last EF measured in February 2020 has improved to 35 to 50%.  On Lasix.  Not on ACE inhibitors or ARB due to chronic kidney disease. Monitor volume status.   Chronic kidney disease stage IV: Creatinine appears to be at baseline. Monitor creatinine, electrolytes, and volume status.  Anemia of renal disease follow CBC.  On chronic steroid therapy reason not clear. Sarcoidosis is documented on Epic, but patient denies this.  I have seen and examined this patient myself. I have spent 32 minutes in her evaluation and care.   DVT prophylaxis: Heparin. Code Status: DNR confirmed with patient's husband. Family Communication: None available. Disposition Plan: Home.  Anthonyjames Bargar, DO [facility] Direct contact: see [email or web address]  7PM-7AM contact night coverage as above 11/25/2018, 12:16 PM  LOS: 1 day

## 2018-11-25 NOTE — Consult Note (Signed)
Chief Complaint: Patient was seen in consultation today for diverticular abscess drain Chief Complaint  Patient presents with   Back Pain   at the request of Dr Domenica Reamer  Referring Physician(s): Dr Jeanmarie Hubert  Supervising Physician: Jacqulynn Cadet  Patient Status: [facility] - In-pt  History of Present Illness: Heather Benson is a 85 y.o. female   Afib/ systolic HF CAD; HTN/ HLD Admitted with back pain Has had previous Vertebroplasties-- all helpful with pain MR does show L5 acute fx But  Also reveals diverticular abscess Dr Estanislado Pandy did approved L5 KP--- but cannot not move forward with KP till diverticular infection is resolved CT:  IMPRESSION: 1. Extensive sigmoid colon diverticular disease. Mild wall thickening and soft tissue thickening at the sigmoid colon. 4.2 x 3.7 cm gas and fluid collection along the left aspect of the sigmoid colon, new since CT from 10/15/2017 and presumably representing diverticular abscess. 2. Acute fracture through superior endplate of L5. Multiple chronic compression fractures of the thoracic and lumbar spine 3. Slightly dense liver, possibly related to amiodarone use 4. 17 mm mass off the lower pole right kidney, grossly stable in size but slightly more complex in the interim, with increased density values. This could be due to interval hemorrhage or proteinaceous debris. Attention on follow-up imaging suggested.  Request made for diverticular abscess drain placement Dr Laurence Ferrari has reviewed imaging and approves procedure Scheduled for 7/16  Pt is on Hep drip  Past Medical History:  Diagnosis Date   Aortic atherosclerosis ([place]) 12/25/2016   Aortic regurgitation    a. mild-mod by echo 01/2018.   Aortic stenosis, mild 12/14/2015   Arthritis 08-28-11   right knee pain-surgery planned   CAD (coronary artery disease)    a. by cath 2012. b. Elev troponin 01/2018 -> cath not pursued due to renal dysfunction.   Cancer [facility])  08-28-11   Melonoma-cheek(many Yrs ago) , recent bx. left  face lesion, past skin cancer lesions   CAP (community acquired pneumonia) 12/13/2015   Chronic combined systolic and diastolic heart failure ([place])    prob ischemic CM // admx with NSTEMI in 9/19 in setting of pneumonia // Echo 9/19: EF 35-40 // LHC avoided due to CKD //    Complication of anesthesia 08-28-11   in past arrythmia-  cardiology has seen in past   Dilated cardiomyopathy ([place]) 03/20/2018   admx with NSTEMI in setting of prob pneumonia in 9/19 >> prob ischemic CM // Echo 9/19:  EF 35-40, anteroseptal, anterior, inferior, apical hypokinesis consistent with ischemia in the distribution of the LAD, grade 1 diastolic dysfunction, mild aortic stenosis (mean 7, peak 13), mild aortic insufficiency, moderate MAC, mild to moderate MR, moderate to severe LAE, mild RAE, moderate TR, PASP 59 //    GERD (gastroesophageal reflux disease) 08-28-11   tx. omeprazole   Hypercholesterolemia    Hypertension 08-28-11   tx. meds   IBS (irritable bowel syndrome)    Lymphedema    Mild mitral regurgitation    Neuromuscular disorder ([place]) 08-28-11   hx. Polio age 25-slight residual affects legs   Osteoarthritis    back and left knee   Osteoporosis    PAF (paroxysmal atrial fibrillation) ([place])    Pneumonia 12/2015   hospital x 3 days   PONV (postoperative nausea and vomiting)    Renal cyst, right    Sarcoidosis    SVT (supraventricular tachycardia) ([facility])    Swelling of extremity 08-28-11   bilateral lower extremities- mid calf  down    Past Surgical History:  Procedure Laterality Date   ABDOMINAL HYSTERECTOMY  08-28-11   Partial-vaginal approach   BREAST REDUCTION SURGERY  1997   BUNIONECTOMY  08-28-11   right   CARDIOVERSION N/A 08/05/2014   Procedure: CARDIOVERSION;  Surgeon: Larey Dresser, MD;  Location: [place];  Service: Cardiovascular;  Laterality: N/A;   CATARACT EXTRACTION, BILATERAL  08-28-11    bilateral   COLONOSCOPY WITH PROPOFOL N/A 12/24/2016   Procedure: COLONOSCOPY WITH PROPOFOL;  Surgeon: Wonda Horner, MD;  [facility] ENDOSCOPY;  Service: Endoscopy;  Laterality: N/A;   EYE SURGERY     Cataract   IR VERTEBROPLASTY EA ADDL (T&LS) BX INC UNI/BIL INC INJECT/IMAGING  10/22/2017   IR VERTEBROPLASTY LUMBAR BX INC UNI/BIL INC/INJECT/IMAGING  10/22/2017   IR VERTEBROPLASTY LUMBAR BX INC UNI/BIL INC/INJECT/IMAGING  02/02/2018   JOINT REPLACEMENT  Oct. 1, 2012   Right Knee   KNEE ARTHROSCOPY  09/06/2011   Procedure: ARTHROSCOPY KNEE;  Surgeon: Gearlean Alf, MD;  Location: [facility] ORS;  Service: Orthopedics;  Laterality: Right;  with Synovectomy    Allergies: Augmentin [amoxicillin-pot clavulanate], Boniva [ibandronic acid], Codeine, Hydromorphone, Latex, Lisinopril, Amoxicillin, Clavulanic acid, and Adhesive [tape]  Medications: Prior to Admission medications   Medication Sig Start Date End Date Taking? Authorizing Provider  acetaminophen (TYLENOL) 500 MG tablet Take 1,000 mg by mouth every 8 (eight) hours as needed (for pain).    Yes [provider]  amiodarone (PACERONE) 200 MG tablet TAKE 1 TABLET BY MOUTH EVERY DAY Patient taking differently: Take 200 mg by mouth daily.  09/21/18  Yes Jettie Booze, MD  apixaban (ELIQUIS) 2.5 MG TABS tablet Take 1 tablet (2.5 mg total) by mouth 2 (two) times daily. PLEASE START ON 02/03/18 AT 9PM 02/03/18  Yes Bonnielee Haff, MD  atorvastatin (LIPITOR) 10 MG tablet Take 1 tablet (10 mg total) by mouth daily. 09/08/17  Yes Jettie Booze, MD  carvedilol (COREG) 3.125 MG tablet Take 1 tablet (3.125 mg total) by mouth 2 (two) times daily. 06/30/18 11/20/18 Yes Dunn, Dayna N, PA-C  furosemide (LASIX) 20 MG tablet Take 1 tablet (20 mg total) by mouth daily. 02/03/18  Yes Bonnielee Haff, MD  HYDROcodone-acetaminophen (NORCO/VICODIN) 5-325 MG tablet Take 1 tablet by mouth 2 (two) times daily as needed for pain.   Yes [provider]  latanoprost (XALATAN) 0.005 % ophthalmic solution Place 1 drop into both eyes at bedtime. 08/11/14  Yes [provider]  mirtazapine (REMERON) 15 MG tablet Take 15 mg by mouth at bedtime.   Yes [provider]  nitroGLYCERIN (NITROSTAT) 0.4 MG SL tablet Place 1 tablet (0.4 mg total) under the tongue every 5 (five) minutes as needed for chest pain. 03/20/18 11/20/18 Yes Weaver, Scott T, PA-C  polyethylene glycol (MIRALAX / GLYCOLAX) packet Take 17 g by mouth daily as needed. 02/03/18  Yes Bonnielee Haff, MD  potassium chloride (K-DUR) 10 MEQ tablet Take 10 mEq by mouth daily.   Yes [provider]  predniSONE (DELTASONE) 10 MG tablet Take 10 mg by mouth daily with breakfast.   Yes [provider]  Respiratory Therapy Supplies (FLUTTER) DEVI Use as directed. 02/06/16   Marshell Garfinkel, MD     Family History  Problem Relation Age of Onset   Heart disease Mother        Varicose Veins   Hyperlipidemia Mother    Hypertension Mother    Colon cancer Mother    Heart disease Father  Heart Disease before age 16   Hypertension Father    Emphysema Father    Breast cancer Sister    Stroke Maternal Grandmother    Heart attack Brother     Social History   Socioeconomic History   Marital status: Married    Spouse name: [name]   Number of children: 3   Years of education: 12   Highest education level: Not on file  Occupational History   Occupation: Retired- [employer] strain: Not on file   Food insecurity    Worry: Not on file    Inability: Not on Lexicographer needs    Medical: Not on file    Non-medical: Not on file  Tobacco Use   Smoking status: Never Smoker   Smokeless tobacco: Never Used  Substance and Sexual Activity   Alcohol use: No    Alcohol/week: 0.0 standard drinks   Drug use: No   Sexual activity: Not Currently    Partners: Male  Lifestyle    Physical activity    Days per week: Not on file    Minutes per session: Not on file   Stress: Not on file  Relationships   Social connections    Talks on phone: Not on file    Gets together: Not on file    Attends religious service: Not on file    Active member of club or organization: Not on file    Attends meetings of clubs or organizations: Not on file    Relationship status: Not on file  Other Topics Concern   Not on file  Social History Narrative   Lives with husband, Hilbert Odor at home   Caffeine use: Drinks coffee/tea/caffeine free soda   OCCUPATION: retired     Review of Systems: A 12 point ROS discussed and pertinent positives are indicated in the HPI above.  All other systems are negative.  Review of Systems  Constitutional: Positive for activity change, appetite change and fatigue. Negative for fever and unexpected weight change.  Respiratory: Negative for cough and shortness of breath.   Cardiovascular: Negative for chest pain.  Gastrointestinal: Negative for abdominal pain and nausea.  Musculoskeletal: Positive for back pain and gait problem.  Neurological: Positive for weakness.  Psychiatric/Behavioral: Negative for behavioral problems and confusion.    Vital Signs: BP 120/74 (BP Location: Left Arm)    Pulse 75    Temp 98.5 F (36.9 C)    Resp 16    Ht 5' 6.5" (1.689 m)    Wt 158 lb 11.7 oz (72 kg)    SpO2 100%    BMI 25.24 kg/m   Physical Exam Vitals signs reviewed.  Cardiovascular:     Rate and Rhythm: Normal rate. Rhythm irregular.     Heart sounds: Normal heart sounds.  Pulmonary:     Effort: Pulmonary effort is normal.     Breath sounds: Normal breath sounds.  Abdominal:     Palpations: Abdomen is soft.  Musculoskeletal: Normal range of motion.  Skin:    General: Skin is warm and dry.  Neurological:     Mental Status: She is alert and oriented to person, place, and time.  Psychiatric:        Mood and Affect: Mood normal.        Behavior:  Behavior normal.        Thought Content: Thought content normal.        Judgment: Judgment normal.  Imaging: Ct Abdomen Pelvis Wo Contrast  Result Date: 11/25/2018 CLINICAL DATA:  Diverticulitis in the rectosigmoid colon with adjacent area of abscess EXAM: CT ABDOMEN AND PELVIS WITHOUT CONTRAST TECHNIQUE: Multidetector CT imaging of the abdomen and pelvis was performed following the standard protocol without IV contrast. COMPARISON:  MRI 11/24/2018, CT 10/15/2017 FINDINGS: Lower chest: Lung bases demonstrate dependent atelectasis. Mild bronchiectasis in the right middle lobe and bilateral lung bases with mild bronchial wall thickening. Borderline to mild cardiomegaly. Dense mitral calcification. Coronary vascular calcifications. Hepatobiliary: Liver appears increased in density. Small stones in the gallbladder. No biliary dilatation. Pancreas: Unremarkable. No pancreatic ductal dilatation or surrounding inflammatory changes. Spleen: Within normal limits Adrenals/Urinary Tract: Adrenal glands are within normal limits. Kidneys show no hydronephrosis. Multiple bilateral renal cysts, many of which are exophytic. 17 mm lower pole slightly complex cyst demonstrates increased density values internally. Urinary bladder is unremarkable Stomach/Bowel: Stomach nonenlarged. Small hiatal hernia. No dilated small. Negative appendix. Extensive diverticular disease of the sigmoid colon. Mild wall thickening of the sigmoid colon but without much inflammatory change. 4.2 x 3.7 cm gas and fluid collection along the left aspect of the sigmoid colon, new as compared with prior CT from 10/15/2017 and presumably representing a diverticular abscess. Vascular/Lymphatic: Extensive aortic atherosclerosis. No significantly enlarged lymph nodes. Reproductive: Status post hysterectomy. No adnexal masses. Other: Negative for free air or free fluid. Musculoskeletal: Kyphosis of the spine. Severe chronic compression fracture T12.  Treated compression fractures L1 through L4. Acute fracture through the superior endplate L5, without significant loss of vertebral body height. Healing sacral fracture. IMPRESSION: 1. Extensive sigmoid colon diverticular disease. Mild wall thickening and soft tissue thickening at the sigmoid colon. 4.2 x 3.7 cm gas and fluid collection along the left aspect of the sigmoid colon, new since CT from 10/15/2017 and presumably representing diverticular abscess. 2. Acute fracture through superior endplate of L5. Multiple chronic compression fractures of the thoracic and lumbar spine 3. Slightly dense liver, possibly related to amiodarone use 4. 17 mm mass off the lower pole right kidney, grossly stable in size but slightly more complex in the interim, with increased density values. This could be due to interval hemorrhage or proteinaceous debris. Attention on follow-up imaging suggested. Electronically Signed   By: Donavan Foil M.D.   On: 11/25/2018 02:48   Ct Cervical Spine Wo Contrast  Result Date: 11/20/2018 CLINICAL DATA:  Neck pain and chronic back pain. EXAM: CT CERVICAL, THORACIC, AND LUMBAR SPINE WITHOUT CONTRAST TECHNIQUE: Multidetector CT imaging of the cervical, thoracic and lumbar spine was performed without intravenous contrast. Multiplanar CT image reconstructions were also generated. COMPARISON:  Chest CT January 26, 2018, CT of the lumbosacral spine December 12, 2017, CT of the abdomen December 24, 2016 FINDINGS: CT CERVICAL SPINE FINDINGS Alignment: Slightly exaggerated cervical lordosis. Skull base and vertebrae: No acute fracture. No primary bone lesion or focal pathologic process. Osteopenia. Soft tissues and spinal canal: No prevertebral fluid or swelling. No visible canal hematoma. Disc levels: Mild multilevel osteoarthritic changes. Mild to moderate posterior facet arthropathy. Mild osseous neural foraminal narrowing of bilateral C3-C4, and right C4-C5. CT THORACIC SPINE FINDINGS Alignment:  Exaggerated thoracic kyphosis. Vertebrae: Mild progression of the moderate compression fracture of T12 vertebral body with approximately 70% height loss centrally, with minimal retropulsion to the spinal canal. Paraspinal and other soft tissues: Negative. Disc levels: Diffuse spondylosis of the thoracic spine. Stigmata of diffuse idiopathic skeletal hyperostosis of the thoracic spine. Chest: Bilateral lung dependent atelectasis. Enlarged heart. Bulky annular  calcifications of the mitral and aortic valves. Calcific atherosclerotic disease of the coronary arteries. CT LUMBAR SPINE FINDINGS Segmentation: 5 lumbar type vertebrae. Alignment: Exaggerated lumbar lordosis. Stable 9 mm retropulsion associated with chronic L1 burst fracture. Vertebrae: Prior vertebroplasty of L1, L2, L3 and L4. No acute fractures are seen. Paraspinal and other soft tissues: The paraspinal soft tissues are normal. Tortuosity of the abdominal aorta with a saccular dilation of the proximal infrarenal abdominal aorta measuring 2.2 cm and a second saccular dilation of the more distal infrarenal aorta measuring 2.4 cm. Disc levels: Mild-to-moderate multilevel osteoarthritic changes with disc osteophyte complexes and posterior facet arthropathy. L1-L2, mild spinal stenosis posterior to L1 vertebral body due to retropulsion, stable. L2-L3, mild disc bulge and facet arthropathy. L3-L4, broad-based disc bulge with minimal spinal canal stenosis and bilateral neural foraminal stenosis. L4-L5, mild broad-based disc bulge with mild bilateral neural foraminal stenosis. L5-S1, mild right neural foraminal stenosis. IMPRESSION: 1. Osteopenia.  No evidence of acute traumatic injury to the spine. 2. Mild progression of the compression fracture of T12 vertebral body with approximately 70% height loss centrally, and mild retropulsion into the spinal canal. 3. Prior vertebroplasty of L1, L2, L3 and L4. Stable 9 mm retropulsion associated with the chronic L1 burst  fracture. 4. Mild bilateral neural foraminal stenosis at C3-C4, C4-C5, and L3-L4, L4-L5 and L5-S1 due to osteoarthritic changes. 5. Enlarged heart with extensive annular calcifications of the mitral and aortic valves. Calcific atherosclerotic disease of the coronary arteries and the aorta. 6. Tortuosity of the abdominal aorta with a saccular dilation of the proximal infrarenal abdominal aorta measuring 2.2 cm and a second saccular dilation of the more distal infrarenal aorta measuring 2.4 cm. Electronically Signed   By: Fidela Salisbury M.D.   On: 11/20/2018 20:36   Ct Thoracic Spine Wo Contrast  Result Date: 11/20/2018 CLINICAL DATA:  Neck pain and chronic back pain. EXAM: CT CERVICAL, THORACIC, AND LUMBAR SPINE WITHOUT CONTRAST TECHNIQUE: Multidetector CT imaging of the cervical, thoracic and lumbar spine was performed without intravenous contrast. Multiplanar CT image reconstructions were also generated. COMPARISON:  Chest CT January 26, 2018, CT of the lumbosacral spine December 12, 2017, CT of the abdomen December 24, 2016 FINDINGS: CT CERVICAL SPINE FINDINGS Alignment: Slightly exaggerated cervical lordosis. Skull base and vertebrae: No acute fracture. No primary bone lesion or focal pathologic process. Osteopenia. Soft tissues and spinal canal: No prevertebral fluid or swelling. No visible canal hematoma. Disc levels: Mild multilevel osteoarthritic changes. Mild to moderate posterior facet arthropathy. Mild osseous neural foraminal narrowing of bilateral C3-C4, and right C4-C5. CT THORACIC SPINE FINDINGS Alignment: Exaggerated thoracic kyphosis. Vertebrae: Mild progression of the moderate compression fracture of T12 vertebral body with approximately 70% height loss centrally, with minimal retropulsion to the spinal canal. Paraspinal and other soft tissues: Negative. Disc levels: Diffuse spondylosis of the thoracic spine. Stigmata of diffuse idiopathic skeletal hyperostosis of the thoracic spine. Chest:  Bilateral lung dependent atelectasis. Enlarged heart. Bulky annular calcifications of the mitral and aortic valves. Calcific atherosclerotic disease of the coronary arteries. CT LUMBAR SPINE FINDINGS Segmentation: 5 lumbar type vertebrae. Alignment: Exaggerated lumbar lordosis. Stable 9 mm retropulsion associated with chronic L1 burst fracture. Vertebrae: Prior vertebroplasty of L1, L2, L3 and L4. No acute fractures are seen. Paraspinal and other soft tissues: The paraspinal soft tissues are normal. Tortuosity of the abdominal aorta with a saccular dilation of the proximal infrarenal abdominal aorta measuring 2.2 cm and a second saccular dilation of the more distal infrarenal aorta  measuring 2.4 cm. Disc levels: Mild-to-moderate multilevel osteoarthritic changes with disc osteophyte complexes and posterior facet arthropathy. L1-L2, mild spinal stenosis posterior to L1 vertebral body due to retropulsion, stable. L2-L3, mild disc bulge and facet arthropathy. L3-L4, broad-based disc bulge with minimal spinal canal stenosis and bilateral neural foraminal stenosis. L4-L5, mild broad-based disc bulge with mild bilateral neural foraminal stenosis. L5-S1, mild right neural foraminal stenosis. IMPRESSION: 1. Osteopenia.  No evidence of acute traumatic injury to the spine. 2. Mild progression of the compression fracture of T12 vertebral body with approximately 70% height loss centrally, and mild retropulsion into the spinal canal. 3. Prior vertebroplasty of L1, L2, L3 and L4. Stable 9 mm retropulsion associated with the chronic L1 burst fracture. 4. Mild bilateral neural foraminal stenosis at C3-C4, C4-C5, and L3-L4, L4-L5 and L5-S1 due to osteoarthritic changes. 5. Enlarged heart with extensive annular calcifications of the mitral and aortic valves. Calcific atherosclerotic disease of the coronary arteries and the aorta. 6. Tortuosity of the abdominal aorta with a saccular dilation of the proximal infrarenal abdominal aorta  measuring 2.2 cm and a second saccular dilation of the more distal infrarenal aorta measuring 2.4 cm. Electronically Signed   By: Fidela Salisbury M.D.   On: 11/20/2018 20:36   Ct Lumbar Spine Wo Contrast  Result Date: 11/20/2018 CLINICAL DATA:  Neck pain and chronic back pain. EXAM: CT CERVICAL, THORACIC, AND LUMBAR SPINE WITHOUT CONTRAST TECHNIQUE: Multidetector CT imaging of the cervical, thoracic and lumbar spine was performed without intravenous contrast. Multiplanar CT image reconstructions were also generated. COMPARISON:  Chest CT January 26, 2018, CT of the lumbosacral spine December 12, 2017, CT of the abdomen December 24, 2016 FINDINGS: CT CERVICAL SPINE FINDINGS Alignment: Slightly exaggerated cervical lordosis. Skull base and vertebrae: No acute fracture. No primary bone lesion or focal pathologic process. Osteopenia. Soft tissues and spinal canal: No prevertebral fluid or swelling. No visible canal hematoma. Disc levels: Mild multilevel osteoarthritic changes. Mild to moderate posterior facet arthropathy. Mild osseous neural foraminal narrowing of bilateral C3-C4, and right C4-C5. CT THORACIC SPINE FINDINGS Alignment: Exaggerated thoracic kyphosis. Vertebrae: Mild progression of the moderate compression fracture of T12 vertebral body with approximately 70% height loss centrally, with minimal retropulsion to the spinal canal. Paraspinal and other soft tissues: Negative. Disc levels: Diffuse spondylosis of the thoracic spine. Stigmata of diffuse idiopathic skeletal hyperostosis of the thoracic spine. Chest: Bilateral lung dependent atelectasis. Enlarged heart. Bulky annular calcifications of the mitral and aortic valves. Calcific atherosclerotic disease of the coronary arteries. CT LUMBAR SPINE FINDINGS Segmentation: 5 lumbar type vertebrae. Alignment: Exaggerated lumbar lordosis. Stable 9 mm retropulsion associated with chronic L1 burst fracture. Vertebrae: Prior vertebroplasty of L1, L2, L3 and L4.  No acute fractures are seen. Paraspinal and other soft tissues: The paraspinal soft tissues are normal. Tortuosity of the abdominal aorta with a saccular dilation of the proximal infrarenal abdominal aorta measuring 2.2 cm and a second saccular dilation of the more distal infrarenal aorta measuring 2.4 cm. Disc levels: Mild-to-moderate multilevel osteoarthritic changes with disc osteophyte complexes and posterior facet arthropathy. L1-L2, mild spinal stenosis posterior to L1 vertebral body due to retropulsion, stable. L2-L3, mild disc bulge and facet arthropathy. L3-L4, broad-based disc bulge with minimal spinal canal stenosis and bilateral neural foraminal stenosis. L4-L5, mild broad-based disc bulge with mild bilateral neural foraminal stenosis. L5-S1, mild right neural foraminal stenosis. IMPRESSION: 1. Osteopenia.  No evidence of acute traumatic injury to the spine. 2. Mild progression of the compression fracture of T12 vertebral  body with approximately 70% height loss centrally, and mild retropulsion into the spinal canal. 3. Prior vertebroplasty of L1, L2, L3 and L4. Stable 9 mm retropulsion associated with the chronic L1 burst fracture. 4. Mild bilateral neural foraminal stenosis at C3-C4, C4-C5, and L3-L4, L4-L5 and L5-S1 due to osteoarthritic changes. 5. Enlarged heart with extensive annular calcifications of the mitral and aortic valves. Calcific atherosclerotic disease of the coronary arteries and the aorta. 6. Tortuosity of the abdominal aorta with a saccular dilation of the proximal infrarenal abdominal aorta measuring 2.2 cm and a second saccular dilation of the more distal infrarenal aorta measuring 2.4 cm. Electronically Signed   By: Fidela Salisbury M.D.   On: 11/20/2018 20:36   Mr Lumbar Spine Wo Contrast  Result Date: 11/24/2018 CLINICAL DATA:  T12 compression fracture. EXAM: MRI LUMBAR SPINE WITHOUT CONTRAST TECHNIQUE: Multiplanar, multisequence MR imaging of the lumbar spine was  performed. No intravenous contrast was administered. COMPARISON:  11/20/2018 CT FINDINGS: Segmentation:  Standard lumbar numbering Alignment: Kyphosis at the thoracolumbar junction related to chronic compression fractures. Vertebrae: Acute appearing compression fracture at L5 with marrow edema in the upper body where there is a horizontal fracture plane. Remote T12 compression fracture with unchanged advanced height loss and mild residual edema when compared to prior. Remote compression fracture at L1-L4 with completed healing at these levels. Cement augmentation performed at L1-L3. Sacral insufficiency fracture at S2 and bilateral sacral ala which is new from prior but only with minimal residual edema. Conus medullaris and cauda equina: Conus extends to the L2 level. Conus and cauda equina appear normal. Paraspinal and other soft tissues: Renal cystic intensities. Severe atrophy of intrinsic back muscles. Disc levels: Usual degenerative changes for age.  No degenerative impingement These results were called by telephone at the time of interpretation on 11/24/2018 at 11:43 am to Dr. Benny Lennert , who verbally acknowledged these results. IMPRESSION: 1. L5 acute or early subacute compression fracture, suspect this is the most symptomatic level. 2. Remote T12 compression fracture with similar height loss to September 2019 and only mild residual edema. 3. Interval but nonacute bilateral sacral ala and S2 insufficiency fractures. 4. Additional incidental finding on lumbar spine CT from 4 days ago, there is a partially covered mass in the left hemipelvis favoring diverticular abscess. Recommend abdomen and pelvis CT. Electronically Signed   By: Monte Fantasia M.D.   On: 11/24/2018 11:43   Dg Chest Port 1 View  Result Date: 11/20/2018 CLINICAL DATA:  New oxygen requirement EXAM: PORTABLE CHEST 1 VIEW COMPARISON:  January 27, 2018 FINDINGS: There is elevation right hemidiaphragm. Opacity in left base could represent  atelectasis or infiltrate with obscuration left hemidiaphragm. The cardiomediastinal silhouette is stable. No pneumothorax. IMPRESSION: Opacity in the left base obscuring left hemidiaphragm could represent atelectasis or infiltrate. Recommend follow-up to resolution. No other interval changes. Electronically Signed   By: Dorise Bullion III M.D   On: 11/20/2018 21:54    Labs:  CBC: Recent Labs    11/22/18 0354 11/23/18 4008  11/24/18 0225 11/24/18 1153 11/24/18 1814 11/25/18 0236 11/25/18 1002  WBC 13.9* 12.3*  --  10.6*  --   --  11.9*  --   HGB 11.8* 11.7*   < > 12.6 12.5 14.1 13.8 12.7  HCT 39.0 40.4   < > 42.6 42.2 47.0* 45.6 43.0  PLT 226 209  --  236  --   --  229  --    < > = values in this interval  not displayed.    COAGS: Recent Labs    01/26/18 1016  11/22/18 1310 11/22/18 2219 11/23/18 0337 11/24/18 0225 11/25/18 1203  INR 1.07  --   --   --   --   --  1.1  APTT 28   < > 59* 100* 71* 98*  --    < > = values in this interval not displayed.    BMP: Recent Labs    11/21/18 0517 11/22/18 0354 11/23/18 0337 11/25/18 0236  NA 141 139 141 136  K 4.8 4.8 5.0 4.9  CL 102 104 107 100  CO2 _0 GLUCOSE 62* 114* 85 80  BUN 29* 34* 28* 21  CALCIUM 8.2* 8.2* 7.9* 8.8*  CREATININE 2.05* 1.89* 1.59* 1.53*  GFRNONAA 22* 24* 29* 31*  GFRAA 25* 28* 34* 36*    LIVER FUNCTION TESTS: Recent Labs    01/29/18 0458 01/30/18 0550 09/24/18 1102 11/20/18 2216 11/21/18 0517  BILITOT 0.8  --  0.8 1.2 1.2  AST 28  --  38* 25 24  ALT 30  --  33 18 15  ALKPHOS 103  --  322* 101 103  PROT 4.6*  --  5.7* 5.8* 5.3*  ALBUMIN 2.1* 2.1* 3.1* 2.2* 2.1*    TUMOR MARKERS: No results for input(s): AFPTM, CEA, CA199, CHROMGRNA in the last 8760 hours.  Assessment and Plan:  Diverticular abscess Scheduled for drain placement in IR tomorrow Risks and benefits discussed with the patient including bleeding, infection, damage to adjacent structures, bowel  perforation/fistula connection, and sepsis.  All of the patient's questions were answered, patient is agreeable to proceed. Consent signed and in chart.   Thank you for this interesting consult.  I greatly enjoyed meeting KEITH CANCIO and look forward to participating in their care.  A copy of this report was sent to the requesting provider on this date.  Electronically Signed: Lavonia Drafts, PA-C 11/25/2018, 2:36 PM   I spent a total of 40 Minutes    in face to face in clinical consultation, greater than 50% of which was counseling/coordinating care for diverticular abscess drain

## 2018-11-25 NOTE — Progress Notes (Signed)
Physical Therapy Treatment Patient Details Name: Heather Benson MRN: [id number] DOB: 19-Feb-1932 Today's Date: 11/25/2018    History of Present Illness 85 y.o. female with history of chronic systolic heart failure paroxysmal atrial fibrillation CAD hypertension hyperlipidemia with previous vertebroplasty's has been experiencing increasing back pain over the last 2 weeks.  Patient is supposed to see orthopedic but since patient pain is worsening came to the ER.  Patient has been also noted that patient has been getting more short of breath last few days.    PT Comments    Pt showing some confusion this session, as she does not believe she is in the hospital, though she was reminder several times she was at [facility]. She was able to slowly progress to sitting EOB with mod A. Once seated, pt c/o dizziness requiring return to supine position. Attempted to place pt's bed in chair position, but was not able to come fully upright secondary to back pain. Pt is making very slow progress towards goals at this time. Patient would benefit from continued skilled PT to maximize functional independence and safety with mobility. Will continue to follow acutely.     Follow Up Recommendations  SNF;Supervision/Assistance - 24 hour     Equipment Recommendations  (TBD next venue)    Recommendations for Other Services       Precautions / Restrictions Precautions Precautions: Back;Fall Restrictions Weight Bearing Restrictions: No    Mobility  Bed Mobility Overal bed mobility: Needs Assistance Bed Mobility: Rolling;Sidelying to Sit Rolling: Min assist Sidelying to sit: Mod assist       General bed mobility comments: Cueing for technique with log roll. Pt required break once in side laying, secondary to pain. Mod A given to rise into sitting position. Once pt was sitting EOB she reporter dizziness that worstened the longer she was sitting and requested to lay back down.   Transfers                  General transfer comment: pt unable at this time secondary to dizziness.  Ambulation/Gait                 Stairs             Wheelchair Mobility    Modified Rankin (Stroke Patients Only)       Balance                                            Cognition Arousal/Alertness: Awake/alert Behavior During Therapy: Flat affect Overall Cognitive Status: Impaired/Different from baseline Area of Impairment: Memory                     Memory: Decreased short-term memory         General Comments: Several times pt made some reference to not being in the hospital and was reminded several times that she was in the hospital.      Exercises      General Comments        Pertinent Vitals/Pain Pain Assessment: 0-10 Pain Score: 8  Pain Location: back Pain Descriptors / Indicators: Discomfort;Grimacing Pain Intervention(s): Limited activity within patient's tolerance;Repositioned;Monitored during session    Home Living                      Prior Function  PT Goals (current goals can now be found in the care plan section) Acute Rehab PT Goals Patient Stated Goal: "for pain to be better" PT Goal Formulation: With patient Time For Goal Achievement: 12/06/18 Potential to Achieve Goals: Fair Progress towards PT goals: Progressing toward goals(slow progress)    Frequency    Min 2X/week      PT Plan Current plan remains appropriate    Co-evaluation              AM-PAC PT "6 Clicks" Mobility   Outcome Measure  Help needed turning from your back to your side while in a flat bed without using bedrails?: A Little Help needed moving from lying on your back to sitting on the side of a flat bed without using bedrails?: A Lot Help needed moving to and from a bed to a chair (including a wheelchair)?: Total Help needed standing up from a chair using your arms (e.g., wheelchair or bedside  chair)?: Total Help needed to walk in hospital room?: Total Help needed climbing 3-5 steps with a railing? : Total 6 Click Score: 9    End of Session   Activity Tolerance: Patient limited by pain;Treatment limited secondary to medical complications (Comment)(dizziness) Patient left: in bed;with call bell/phone within reach;with bed alarm set Nurse Communication: Mobility status PT Visit Diagnosis: Unsteadiness on feet (R26.81)     Time: 8325-4982 PT Time Calculation (min) (ACUTE ONLY): 24 min  Charges:  $Therapeutic Activity: 23-37 mins                    Benjiman Core, [place] Pager [phone] Acute Rehab    Allena Katz 11/25/2018, 1:15 PM

## 2018-11-25 NOTE — Plan of Care (Signed)
  Problem: Education: Goal: Knowledge of General Education information will improve Description: Including pain rating scale, medication(s)/side effects and non-pharmacologic comfort measures Outcome: Progressing   Problem: Health Behavior/Discharge Planning: Goal: Ability to manage health-related needs will improve Outcome: Progressing   Problem: Clinical Measurements: Goal: Ability to maintain clinical measurements within normal limits will improve Outcome: Progressing Goal: Will remain free from infection Outcome: Progressing Goal: Diagnostic test results will improve Outcome: Progressing Goal: Respiratory complications will improve Outcome: Progressing Goal: Cardiovascular complication will be avoided Outcome: Progressing   Problem: Activity: Goal: Risk for activity intolerance will decrease Outcome: Progressing   Problem: Nutrition: Goal: Adequate nutrition will be maintained Outcome: Progressing   Problem: Coping: Goal: Level of anxiety will decrease Outcome: Progressing   Problem: Elimination: Goal: Will not experience complications related to bowel motility Outcome: Progressing Goal: Will not experience complications related to urinary retention Outcome: Progressing   Problem: Pain Managment: Goal: General experience of comfort will improve Outcome: Progressing   Problem: Safety: Goal: Ability to remain free from injury will improve Outcome: Progressing   Problem: Skin Integrity: Goal: Risk for impaired skin integrity will decrease Outcome: Progressing   Problem: Activity: Goal: Ability to tolerate increased activity will improve Outcome: Progressing   Problem: Clinical Measurements: Goal: Ability to maintain a body temperature in the normal range will improve Outcome: Progressing   Problem: Respiratory: Goal: Ability to maintain adequate ventilation will improve Outcome: Progressing Goal: Ability to maintain a clear airway will improve Outcome:  Progressing   Problem: Education: Goal: Ability to demonstrate management of disease process will improve Outcome: Progressing Goal: Ability to verbalize understanding of medication therapies will improve Outcome: Progressing Goal: Individualized Educational Video(s) Outcome: Progressing   Problem: Activity: Goal: Capacity to carry out activities will improve Outcome: Progressing   Problem: Cardiac: Goal: Ability to achieve and maintain adequate cardiopulmonary perfusion will improve Outcome: Progressing   

## 2018-11-25 NOTE — Consult Note (Signed)
Heather Benson [birth year]  [id number].    Requesting MD: Dr. Karie Kirks Chief Complaint/Reason for Consult: diverticular abscess  HPI:  This is an 85 yo white female with HTN, CAD, a fib on eliquis, cardiomyopathy, and significant osteoporosis results in multiple vertebral fractures.  She denies any trauma or falls resulting in these.  She has been having significant back pain and that is why she was admitted several days ago.  She had imaging completed and was being evaluated by NIR for a KP.  During her imaging, she was incidentally noted to have a LLQ abscess.  A dedicated CT a/p was completed which revealed mild diverticulitis with what appears to be a diverticular abscess.  We have been asked to see her for further recommendations.  She denise any abdominal pain, N/V/D or issues eating.  She was unaware she had any issues with her abdomen.  ROS: ROS: Please see HPI, otherwise negative.  Family History  Problem Relation Age of Onset   Heart disease Mother        Varicose Veins   Hyperlipidemia Mother    Hypertension Mother    Colon cancer Mother    Heart disease Father        Heart Disease before age 20   Hypertension Father    Emphysema Father    Breast cancer Sister    Stroke Maternal Grandmother    Heart attack Brother     Past Medical History:  Diagnosis Date   Aortic atherosclerosis ([place]) 12/25/2016   Aortic regurgitation    a. mild-mod by echo 01/2018.   Aortic stenosis, mild 12/14/2015   Arthritis 08-28-11   right knee pain-surgery planned   CAD (coronary artery disease)    a. by cath 2012. b. Elev troponin 01/2018 -> cath not pursued due to renal dysfunction.   Cancer [facility]) 08-28-11   Melonoma-cheek(many Yrs ago) , recent bx. left  face lesion, past skin cancer lesions   CAP (community acquired pneumonia) 12/13/2015   Chronic combined systolic and diastolic heart failure ([place])    prob ischemic CM // admx with NSTEMI in 9/19 in setting of  pneumonia // Echo 9/19: EF 35-40 // LHC avoided due to CKD //    Complication of anesthesia 08-28-11   in past arrythmia-  cardiology has seen in past   Dilated cardiomyopathy ([place]) 03/20/2018   admx with NSTEMI in setting of prob pneumonia in 9/19 >> prob ischemic CM // Echo 9/19:  EF 35-40, anteroseptal, anterior, inferior, apical hypokinesis consistent with ischemia in the distribution of the LAD, grade 1 diastolic dysfunction, mild aortic stenosis (mean 7, peak 13), mild aortic insufficiency, moderate MAC, mild to moderate MR, moderate to severe LAE, mild RAE, moderate TR, PASP 59 //    GERD (gastroesophageal reflux disease) 08-28-11   tx. omeprazole   Hypercholesterolemia    Hypertension 08-28-11   tx. meds   IBS (irritable bowel syndrome)    Lymphedema    Mild mitral regurgitation    Neuromuscular disorder ([place]) 08-28-11   hx. Polio age 16-slight residual affects legs   Osteoarthritis    back and left knee   Osteoporosis    PAF (paroxysmal atrial fibrillation) ([place])    Pneumonia 12/2015   hospital x 3 days   PONV (postoperative nausea and vomiting)    Renal cyst, right    Sarcoidosis    SVT (supraventricular tachycardia) ([facility])    Swelling of extremity 08-28-11   bilateral lower extremities- mid calf  down    Past Surgical History:  Procedure Laterality Date   ABDOMINAL HYSTERECTOMY  08-28-11   Partial-vaginal approach   BREAST REDUCTION SURGERY  1997   BUNIONECTOMY  08-28-11   right   CARDIOVERSION N/A 08/05/2014   Procedure: CARDIOVERSION;  Surgeon: Larey Dresser, MD;  Location: [place];  Service: Cardiovascular;  Laterality: N/A;   CATARACT EXTRACTION, BILATERAL  08-28-11   bilateral   COLONOSCOPY WITH PROPOFOL N/A 12/24/2016   Procedure: COLONOSCOPY WITH PROPOFOL;  Surgeon: Wonda Horner, MD;  Location: [facility] ENDOSCOPY;  Service: Endoscopy;  Laterality: N/A;   EYE SURGERY     Cataract   IR VERTEBROPLASTY EA ADDL (T&LS) BX INC UNI/BIL INC  INJECT/IMAGING  10/22/2017   IR VERTEBROPLASTY LUMBAR BX INC UNI/BIL INC/INJECT/IMAGING  10/22/2017   IR VERTEBROPLASTY LUMBAR BX INC UNI/BIL INC/INJECT/IMAGING  02/02/2018   JOINT REPLACEMENT  Oct. 1, 2012   Right Knee   KNEE ARTHROSCOPY  09/06/2011   Procedure: ARTHROSCOPY KNEE;  Surgeon: Gearlean Alf, MD;  Location: [facility] ORS;  Service: Orthopedics;  Laterality: Right;  with Synovectomy    Social History:  reports that she has never smoked. She has never used smokeless tobacco. She reports that she does not drink alcohol or use drugs.  Allergies:  Allergies  Allergen Reactions   Augmentin [Amoxicillin-Pot Clavulanate] Diarrhea and Nausea Only   Boniva [Ibandronic Acid] Diarrhea   Codeine Other (See Comments)    "Spaced out feeling" and Lightheadedness   Hydromorphone Nausea And Vomiting   Latex Rash    "Burns me"   Lisinopril Cough   Amoxicillin    Clavulanic Acid    Adhesive [Tape] Itching and Rash    Medications Prior to Admission  Medication Sig Dispense Refill   acetaminophen (TYLENOL) 500 MG tablet Take 1,000 mg by mouth every 8 (eight) hours as needed (for pain).      amiodarone (PACERONE) 200 MG tablet TAKE 1 TABLET BY MOUTH EVERY DAY (Patient taking differently: Take 200 mg by mouth daily. ) 90 tablet 1   apixaban (ELIQUIS) 2.5 MG TABS tablet Take 1 tablet (2.5 mg total) by mouth 2 (two) times daily. PLEASE START ON 02/03/18 AT 9PM     atorvastatin (LIPITOR) 10 MG tablet Take 1 tablet (10 mg total) by mouth daily. 90 tablet 3   carvedilol (COREG) 3.125 MG tablet Take 1 tablet (3.125 mg total) by mouth 2 (two) times daily. 180 tablet 3   furosemide (LASIX) 20 MG tablet Take 1 tablet (20 mg total) by mouth daily.     HYDROcodone-acetaminophen (NORCO/VICODIN) 5-325 MG tablet Take 1 tablet by mouth 2 (two) times daily as needed for pain.     latanoprost (XALATAN) 0.005 % ophthalmic solution Place 1 drop into both eyes at bedtime.  3   mirtazapine (REMERON)  15 MG tablet Take 15 mg by mouth at bedtime.     nitroGLYCERIN (NITROSTAT) 0.4 MG SL tablet Place 1 tablet (0.4 mg total) under the tongue every 5 (five) minutes as needed for chest pain. 90 tablet 3   polyethylene glycol (MIRALAX / GLYCOLAX) packet Take 17 g by mouth daily as needed. 14 each 0   potassium chloride (K-DUR) 10 MEQ tablet Take 10 mEq by mouth daily.     predniSONE (DELTASONE) 10 MG tablet Take 10 mg by mouth daily with breakfast.     Respiratory Therapy Supplies (FLUTTER) DEVI Use as directed. 1 each 0     Physical Exam: Blood pressure 120/74, pulse 75, temperature  98.5 F (36.9 C), resp. rate 16, height 5' 6.5" (1.689 m), weight 72 kg, SpO2 100 %. General: pleasant, elderly white female who is laying in bed in NAD HEENT: head is normocephalic, atraumatic.  Sclera are noninjected.  PERRL.  Ears and nose without any masses or lesions.  Mouth is pink and moist Heart: regular, rate, and rhythm.  Normal s1,s2. No obvious murmurs, gallops, or rubs noted.  Palpable radial and pedal pulses bilaterally Lungs: CTAB, no wheezes, rhonchi, or rales noted.  Respiratory effort nonlabored Abd: soft, minimally tender in LLQ, ND, +BS, no masses, hernias, or organomegaly MS: all 4 extremities are symmetrical with no cyanosis, clubbing.  +2 pitting edema in BLEs Skin: warm and dry with no masses, lesions, or rashes Psych: Alert, oriented to self and situation but unaware she is in the hospital or the year   Results for orders placed or performed during the hospital encounter of 11/20/18 (from the past 48 hour(s))  Hemoglobin and hematocrit, blood     Status: None   Collection Time: 11/23/18  6:44 PM  Result Value Ref Range   Hemoglobin 12.8 12.0 - 15.0 g/dL   HCT 42.8 36.0 - 46.0 %    Comment: Performed at [facility], [place] [address], [place], [place] [address]  APTT     Status: Abnormal   Collection Time: 11/24/18  2:25 AM  Result Value Ref Range   aPTT 98 (H) 24 - 36 seconds     Comment:        IF BASELINE aPTT IS ELEVATED, SUGGEST PATIENT RISK ASSESSMENT BE USED TO DETERMINE APPROPRIATE ANTICOAGULANT THERAPY. Performed at [facility], [place] [address] [place], [place] [address]   CBC     Status: Abnormal   Collection Time: 11/24/18  2:25 AM  Result Value Ref Range   WBC 10.6 (H) 4.0 - 10.5 K/uL   RBC 4.50 3.87 - 5.11 MIL/uL   Hemoglobin 12.6 12.0 - 15.0 g/dL   HCT 42.6 36.0 - 46.0 %   MCV 94.7 80.0 - 100.0 fL   MCH 28.0 26.0 - 34.0 pg   MCHC 29.6 (L) 30.0 - 36.0 g/dL   RDW 16.6 (H) 11.5 - 15.5 %   Platelets 236 150 - 400 K/uL   nRBC 0.0 0.0 - 0.2 %    Comment: Performed at [facility], [place] [address], [place], [place] [address]  Heparin level (unfractionated)     Status: None   Collection Time: 11/24/18  2:25 AM  Result Value Ref Range   Heparin Unfractionated 0.64 0.30 - 0.70 IU/mL    Comment: (NOTE) If heparin results are below expected values, and patient dosage has  been confirmed, suggest follow up testing of antithrombin III levels. Performed at [facility], [place] [address], [place], [place] [address]   Hemoglobin and hematocrit, blood     Status: None   Collection Time: 11/24/18 11:53 AM  Result Value Ref Range   Hemoglobin 12.5 12.0 - 15.0 g/dL   HCT 42.2 36.0 - 46.0 %    Comment: Performed at [facility], [place] [address], [place], [place] [address]  Hemoglobin and hematocrit, blood     Status: Abnormal   Collection Time: 11/24/18  6:14 PM  Result Value Ref Range   Hemoglobin 14.1 12.0 - 15.0 g/dL   HCT 47.0 (H) 36.0 - 46.0 %    Comment: Performed at [place],  [place] [address]  Heparin level (unfractionated)     Status: None   Collection Time: 11/25/18  2:36 AM  Result Value Ref Range   Heparin Unfractionated 0.55 0.30 - 0.70 IU/mL    Comment: (NOTE) If heparin results are below expected values, and patient dosage has  been confirmed, suggest follow up testing of  antithrombin III levels. Performed at [facility], [place] [address], [place], [place] [address]   CBC with Differential/Platelet     Status: Abnormal   Collection Time: 11/25/18  2:36 AM  Result Value Ref Range   WBC 11.9 (H) 4.0 - 10.5 K/uL   RBC 4.91 3.87 - 5.11 MIL/uL   Hemoglobin 13.8 12.0 - 15.0 g/dL   HCT 45.6 36.0 - 46.0 %   MCV 92.9 80.0 - 100.0 fL   MCH 28.1 26.0 - 34.0 pg   MCHC 30.3 30.0 - 36.0 g/dL   RDW 16.4 (H) 11.5 - 15.5 %   Platelets 229 150 - 400 K/uL   nRBC 0.0 0.0 - 0.2 %   Neutrophils Relative % 71 %   Neutro Abs 8.4 (H) 1.7 - 7.7 K/uL   Lymphocytes Relative 22 %   Lymphs Abs 2.6 0.7 - 4.0 K/uL   Monocytes Relative 6 %   Monocytes Absolute 0.8 0.1 - 1.0 K/uL   Eosinophils Relative 0 %   Eosinophils Absolute 0.0 0.0 - 0.5 K/uL   Basophils Relative 0 %   Basophils Absolute 0.0 0.0 - 0.1 K/uL   Immature Granulocytes 1 %   Abs Immature Granulocytes 0.11 (H) 0.00 - 0.07 K/uL    Comment: Performed at [facility] [address] [place], [place] [address]  Basic metabolic panel     Status: Abnormal   Collection Time: 11/25/18  2:36 AM  Result Value Ref Range   Sodium 136 135 - 145 mmol/L   Potassium 4.9 3.5 - 5.1 mmol/L   Chloride 100 98 - 111 mmol/L   CO2 25 22 - 32 mmol/L   Glucose, Bld 80 70 - 99 mg/dL   BUN 21 8 - 23 mg/dL   Creatinine, Ser 1.53 (H) 0.44 - 1.00 mg/dL   Calcium 8.8 (L) 8.9 - 10.3 mg/dL   GFR calc non Af Amer 31 (L) >60 mL/min   GFR calc Af Amer 36 (L) >60 mL/min   Anion gap 11 5 - 15    Comment: Performed at [facility], [place] [address], [place], [place] [address]  Hemoglobin and hematocrit, blood     Status: None   Collection Time: 11/25/18 10:02 AM  Result Value Ref Range   Hemoglobin 12.7 12.0 - 15.0 g/dL   HCT 43.0 36.0 - 46.0 %    Comment: Performed at [facility], [place] [address], [place],  [address]   Ct Abdomen Pelvis Wo Contrast  Result Date: 11/25/2018 CLINICAL DATA:  Diverticulitis in  the rectosigmoid colon with adjacent area of abscess EXAM: CT ABDOMEN AND PELVIS WITHOUT CONTRAST TECHNIQUE: Multidetector CT imaging of the abdomen and pelvis was performed following the standard protocol without IV contrast. COMPARISON:  MRI 11/24/2018, CT 10/15/2017 FINDINGS: Lower chest: Lung bases demonstrate dependent atelectasis. Mild bronchiectasis in the right middle lobe and bilateral lung bases with mild bronchial wall thickening. Borderline to mild cardiomegaly. Dense mitral calcification. Coronary vascular calcifications. Hepatobiliary: Liver appears increased in density. Small stones in the gallbladder. No biliary dilatation. Pancreas: Unremarkable. No pancreatic ductal dilatation or surrounding inflammatory changes. Spleen:  Within normal limits Adrenals/Urinary Tract: Adrenal glands are within normal limits. Kidneys show no hydronephrosis. Multiple bilateral renal cysts, many of which are exophytic. 17 mm lower pole slightly complex cyst demonstrates increased density values internally. Urinary bladder is unremarkable Stomach/Bowel: Stomach nonenlarged. Small hiatal hernia. No dilated small. Negative appendix. Extensive diverticular disease of the sigmoid colon. Mild wall thickening of the sigmoid colon but without much inflammatory change. 4.2 x 3.7 cm gas and fluid collection along the left aspect of the sigmoid colon, new as compared with prior CT from 10/15/2017 and presumably representing a diverticular abscess. Vascular/Lymphatic: Extensive aortic atherosclerosis. No significantly enlarged lymph nodes. Reproductive: Status post hysterectomy. No adnexal masses. Other: Negative for free air or free fluid. Musculoskeletal: Kyphosis of the spine. Severe chronic compression fracture T12. Treated compression fractures L1 through L4. Acute fracture through the superior endplate L5, without significant loss of vertebral body height. Healing sacral fracture. IMPRESSION: 1. Extensive sigmoid colon  diverticular disease. Mild wall thickening and soft tissue thickening at the sigmoid colon. 4.2 x 3.7 cm gas and fluid collection along the left aspect of the sigmoid colon, new since CT from 10/15/2017 and presumably representing diverticular abscess. 2. Acute fracture through superior endplate of L5. Multiple chronic compression fractures of the thoracic and lumbar spine 3. Slightly dense liver, possibly related to amiodarone use 4. 17 mm mass off the lower pole right kidney, grossly stable in size but slightly more complex in the interim, with increased density values. This could be due to interval hemorrhage or proteinaceous debris. Attention on follow-up imaging suggested. Electronically Signed   By: Donavan Foil M.D.   On: 11/25/2018 02:48   Mr Lumbar Spine Wo Contrast  Result Date: 11/24/2018 CLINICAL DATA:  T12 compression fracture. EXAM: MRI LUMBAR SPINE WITHOUT CONTRAST TECHNIQUE: Multiplanar, multisequence MR imaging of the lumbar spine was performed. No intravenous contrast was administered. COMPARISON:  11/20/2018 CT FINDINGS: Segmentation:  Standard lumbar numbering Alignment: Kyphosis at the thoracolumbar junction related to chronic compression fractures. Vertebrae: Acute appearing compression fracture at L5 with marrow edema in the upper body where there is a horizontal fracture plane. Remote T12 compression fracture with unchanged advanced height loss and mild residual edema when compared to prior. Remote compression fracture at L1-L4 with completed healing at these levels. Cement augmentation performed at L1-L3. Sacral insufficiency fracture at S2 and bilateral sacral ala which is new from prior but only with minimal residual edema. Conus medullaris and cauda equina: Conus extends to the L2 level. Conus and cauda equina appear normal. Paraspinal and other soft tissues: Renal cystic intensities. Severe atrophy of intrinsic back muscles. Disc levels: Usual degenerative changes for age.  No  degenerative impingement These results were called by telephone at the time of interpretation on 11/24/2018 at 11:43 am to Dr. Benny Lennert , who verbally acknowledged these results. IMPRESSION: 1. L5 acute or early subacute compression fracture, suspect this is the most symptomatic level. 2. Remote T12 compression fracture with similar height loss to September 2019 and only mild residual edema. 3. Interval but nonacute bilateral sacral ala and S2 insufficiency fractures. 4. Additional incidental finding on lumbar spine CT from 4 days ago, there is a partially covered mass in the left hemipelvis favoring diverticular abscess. Recommend abdomen and pelvis CT. Electronically Signed   By: Monte Fantasia M.D.   On: 11/24/2018 11:43      Assessment/Plan Diverticular abscess The patient is asymptomatic from this disease process essentially.  She has minimal LLQ pain that she didn't even  know she had until I palpated this area.  We will ask IR to evaluate for perc drain of this collection in the hopes that she improves and resolves without any surgical intervention.  Agree with Cipro/Flagyl.  She is already on a regular diet with minimal pain.  Do not see any reason necessarily to back off to clear liquids given she essentially has no pain.  We will follow with you   FEN - IVFs/heart healthy diet VTE - IV heparin ID - Cipro/Flagyl  [name], [facility] Surgery 11/25/2018, 11:43 AM Pager: [phone]

## 2018-11-25 NOTE — Progress Notes (Signed)
ANTICOAGULATION CONSULT NOTE - Follow Up Consult  Pharmacy Consult for heparin Indication: atrial fibrillation  Allergies  Allergen Reactions  . Augmentin [Amoxicillin-Pot Clavulanate] Diarrhea and Nausea Only  . Boniva [Ibandronic Acid] Diarrhea  . Codeine Other (See Comments)    "Spaced out feeling" and Lightheadedness  . Hydromorphone Nausea And Vomiting  . Latex Rash    "Burns me"  . Lisinopril Cough  . Amoxicillin   . Clavulanic Acid   . Adhesive [Tape] Itching and Rash    Patient Measurements: Height: 5' 6.5" (168.9 cm) Weight: 158 lb 11.7 oz (72 kg) IBW/kg (Calculated) : 60.45 Heparin Dosing Weight: 68.9  Vital Signs: Temp: 98.5 F (36.9 C) (07/15 0625) Temp Source: Oral (07/14 2156) BP: 120/74 (07/15 0625) Pulse Rate: 75 (07/15 0625)  Labs: Recent Labs    11/22/18 2219 11/23/18 0337  11/24/18 0225 11/24/18 1153 11/24/18 1814 11/25/18 0236  HGB  --  11.7*   < > 12.6 12.5 14.1 13.8  HCT  --  40.4   < > 42.6 42.2 47.0* 45.6  PLT  --  209  --  236  --   --  229  APTT 100* 71*  --  98*  --   --   --   HEPARINUNFRC  --  0.46  --  0.64  --   --  0.55  CREATININE  --  1.59*  --   --   --   --  1.53*   < > = values in this interval not displayed.    Estimated Creatinine Clearance: 25.7 mL/min (A) (by C-G formula based on SCr of 1.53 mg/dL (H)).   Medications:  Scheduled:  . amiodarone  200 mg Oral Daily  . atorvastatin  10 mg Oral Daily  . carvedilol  3.125 mg Oral BID  . feeding supplement (ENSURE ENLIVE)  237 mL Oral BID BM  . furosemide  20 mg Oral Daily  . latanoprost  1 drop Both Eyes QHS  . mouth rinse  15 mL Mouth Rinse BID  . mirtazapine  15 mg Oral QHS  . multivitamin  15 mL Oral Daily  . potassium chloride  10 mEq Oral Daily  . predniSONE  10 mg Oral Q breakfast    Assessment: 85 y/o female c/o back/neck pain x months, found to have compression fracture and PNA; holding Eliquis (last dose 7/10 9am), started heparin for Afib. Per IR,  patient qualifies for L5 kypho, but needs treatment for diverticular abscess first, and insurance approval. Patient may undergo possible percutaneous drainage after CT abdomen and pelvis. Plan is to continue heparin for now.  Heparin level therapeutic at 0.55.  H&H stable, PLT WNL, no s/sx of bleed.  Goal of Therapy:  Heparin level 0.3-0.7 units/ml Monitor platelets by anticoagulation protocol: Yes   Plan:  -Continue heparin 1150 units/hr -Daily Heparin Level and CBC -Continue to monitor H&H, platelets, and signs of bleeding -F/U plan to switch back to PO Eliquis  -F/U heparin hold plan if procedure is done  Kennon Holter, PharmD PGY1 Ambulatory Care Pharmacy Resident [employer] Phone: [phone] 11/25/2018,8:36 AM

## 2018-11-26 ENCOUNTER — Inpatient Hospital Stay (HOSPITAL_COMMUNITY): Payer: Medicare Other

## 2018-11-26 LAB — CBC WITH DIFFERENTIAL/PLATELET
Abs Immature Granulocytes: 0.08 10*3/uL — ABNORMAL HIGH (ref 0.00–0.07)
Basophils Absolute: 0 10*3/uL (ref 0.0–0.1)
Basophils Relative: 0 %
Eosinophils Absolute: 0 10*3/uL (ref 0.0–0.5)
Eosinophils Relative: 0 %
HCT: 40.6 % (ref 36.0–46.0)
Hemoglobin: 12.1 g/dL (ref 12.0–15.0)
Immature Granulocytes: 1 %
Lymphocytes Relative: 21 %
Lymphs Abs: 2.1 10*3/uL (ref 0.7–4.0)
MCH: 27.9 pg (ref 26.0–34.0)
MCHC: 29.8 g/dL — ABNORMAL LOW (ref 30.0–36.0)
MCV: 93.5 fL (ref 80.0–100.0)
Monocytes Absolute: 0.8 10*3/uL (ref 0.1–1.0)
Monocytes Relative: 7 %
Neutro Abs: 7.2 10*3/uL (ref 1.7–7.7)
Neutrophils Relative %: 71 %
Platelets: 235 10*3/uL (ref 150–400)
RBC: 4.34 MIL/uL (ref 3.87–5.11)
RDW: 16.4 % — ABNORMAL HIGH (ref 11.5–15.5)
WBC: 10.2 10*3/uL (ref 4.0–10.5)
nRBC: 0 % (ref 0.0–0.2)

## 2018-11-26 LAB — HEMOGLOBIN AND HEMATOCRIT, BLOOD
HCT: 41.1 % (ref 36.0–46.0)
HCT: 41.4 % (ref 36.0–46.0)
HCT: 45.3 % (ref 36.0–46.0)
Hemoglobin: 12.1 g/dL (ref 12.0–15.0)
Hemoglobin: 12.6 g/dL (ref 12.0–15.0)
Hemoglobin: 13.4 g/dL (ref 12.0–15.0)

## 2018-11-26 LAB — SURGICAL PCR SCREEN
MRSA, PCR: POSITIVE — AB
Staphylococcus aureus: POSITIVE — AB

## 2018-11-26 LAB — BASIC METABOLIC PANEL
Anion gap: 9 (ref 5–15)
BUN: 16 mg/dL (ref 8–23)
CO2: 29 mmol/L (ref 22–32)
Calcium: 8.7 mg/dL — ABNORMAL LOW (ref 8.9–10.3)
Chloride: 104 mmol/L (ref 98–111)
Creatinine, Ser: 1.48 mg/dL — ABNORMAL HIGH (ref 0.44–1.00)
GFR calc Af Amer: 37 mL/min — ABNORMAL LOW (ref >60)
GFR calc non Af Amer: 32 mL/min — ABNORMAL LOW (ref >60)
Glucose, Bld: 95 mg/dL (ref 70–99)
Potassium: 4.5 mmol/L (ref 3.5–5.1)
Sodium: 142 mmol/L (ref 135–145)

## 2018-11-26 LAB — HEPARIN LEVEL (UNFRACTIONATED): Heparin Unfractionated: 0.66 IU/mL (ref 0.30–0.70)

## 2018-11-26 MED ORDER — MIDAZOLAM HCL 2 MG/2ML IJ SOLN
INTRAMUSCULAR | Status: AC | PRN
  Administered 2018-11-26: 1 mg via INTRAVENOUS
  Administered 2018-11-26: 0.5 mg via INTRAVENOUS

## 2018-11-26 MED ORDER — MIDAZOLAM HCL 2 MG/2ML IJ SOLN
INTRAMUSCULAR | Status: AC
  Filled 2018-11-26: qty 2

## 2018-11-26 MED ORDER — CHLORHEXIDINE GLUCONATE CLOTH 2 % EX PADS
6.0000 | MEDICATED_PAD | Freq: Every day | CUTANEOUS | Status: AC
  Administered 2018-11-26 – 2018-11-29 (×4): 6 via TOPICAL

## 2018-11-26 MED ORDER — SODIUM CHLORIDE 0.9% FLUSH
5.0000 mL | Freq: Three times a day (TID) | INTRAVENOUS | Status: DC
  Administered 2018-11-26 – 2018-12-05 (×27): 5 mL

## 2018-11-26 MED ORDER — FENTANYL CITRATE (PF) 100 MCG/2ML IJ SOLN
INTRAMUSCULAR | Status: AC
  Filled 2018-11-26: qty 2

## 2018-11-26 MED ORDER — DEXTROSE-NACL 5-0.9 % IV SOLN
INTRAVENOUS | Status: AC
  Administered 2018-11-26: 50 mL/h via INTRAVENOUS

## 2018-11-26 MED ORDER — HEPARIN (PORCINE) 25000 UT/250ML-% IV SOLN
1100.0000 [IU]/h | INTRAVENOUS | Status: DC
  Administered 2018-11-26: 1100 [IU]/h via INTRAVENOUS
  Filled 2018-11-26: qty 250

## 2018-11-26 MED ORDER — LIDOCAINE HCL 1 % IJ SOLN
INTRAMUSCULAR | Status: AC
  Filled 2018-11-26: qty 20

## 2018-11-26 MED ORDER — FENTANYL CITRATE (PF) 100 MCG/2ML IJ SOLN
INTRAMUSCULAR | Status: AC | PRN
  Administered 2018-11-26 (×2): 25 ug via INTRAVENOUS

## 2018-11-26 MED ORDER — MUPIROCIN 2 % EX OINT
1.0000 "application " | TOPICAL_OINTMENT | Freq: Two times a day (BID) | CUTANEOUS | Status: AC
  Administered 2018-11-26 – 2018-11-30 (×10): 1 via NASAL
  Filled 2018-11-26 (×3): qty 22

## 2018-11-26 NOTE — Progress Notes (Signed)
[name] WIO:[id number] DOB: April 06, 1932 DOA: 11/20/2018 PCP: [name], MD  Brief History   Heather Benson is a 85 y.o. female with history of chronic systolic heart failure paroxysmal atrial fibrillation CAD hypertension hyperlipidemia with previous vertebroplasty's has been experiencing increasing back pain over the last 2 weeks.  Patient is supposed to see orthopedic but since patient pain is worsening came to the ER.  Patient has been also noted that patient has been getting more short of breath last few days.  ED Course: In the ER patient was found to be febrile and hypoxic with temperature 100 F chest x-ray shows opacity concerning for pneumonia.  Patient had a CT C-spine L-spine and T-spine.  Shows progression of the T12 vertebral body compression fracture with some retropulsion.  Given the fever hypoxia and chest x-ray showing infiltrates patient was started on antibiotics for pneumonia.  Labs showed WBC count of 14 creatinine 2 platelets 265.  The patient was admitted to a medical bed. She has been started on Rocephin and zithromax for a pneumonia. She has been continued on her home medications as possible.   I have discussed the patient with her orthopedic surgeon Dr. Rolena Infante with [facility]. He has recommended that the patient be evaluated by IR for her compression fracture. I have consulted IR. An MR of the Lumbar spine evaluating T10 - S1 was performed. I have discussed the results with IR. It seems that there is a compression fracture at L5 that they feel is more likely the source of her pain. They also have identified diverticulitis in the rectosigmoid with an adjacent area of abscess. I have discussed possible percutaneous drainage with IR. They have requested a CT abdomen and pelvis first. This has been ordered and the patient's antibiotics have been changed to cipro and flagyl to better cover for enteric pathogen. CT abdomen and pelvis again  demonstrated the diverticulitis and abscess. General surgery was consulted to evaluate the patient. They will contact IR to see if they can percutaneously drain the abscess.  11/26/2018: Patient underwent colonic abscess drain placement by interventional radiology team earlier today.  20 cc of purulent material drained.  No new changes.  Continue antibiotics for now.  Surgical input is also appreciated.  Consultants  . Interventional Radiology . General Surgery  Procedures  . None  Antibiotics   Anti-infectives (From admission, onward)   Start     Dose/Rate Route Frequency Ordered Stop   11/24/18 1800  ciprofloxacin (CIPRO) IVPB 400 mg     400 mg 200 mL/hr over 60 Minutes Intravenous Every 24 hours 11/24/18 1516     11/24/18 1800  metroNIDAZOLE (FLAGYL) IVPB 500 mg     500 mg 100 mL/hr over 60 Minutes Intravenous Every 8 hours 11/24/18 1516     11/21/18 2100  cefTRIAXone (ROCEPHIN) 2 g in sodium chloride 0.9 % 100 mL IVPB  Status:  Discontinued     2 g 200 mL/hr over 30 Minutes Intravenous Every 24 hours 11/21/18 0421 11/24/18 1602   11/21/18 2100  azithromycin (ZITHROMAX) 500 mg in sodium chloride 0.9 % 250 mL IVPB  Status:  Discontinued     500 mg 250 mL/hr over 60 Minutes Intravenous Every 24 hours 11/21/18 0421 11/24/18 1602   11/20/18 2215  cefTRIAXone (ROCEPHIN) 1 g in sodium chloride 0.9 % 100 mL IVPB     1 g 200 mL/hr over 30 Minutes Intravenous  Once 11/20/18 2204 11/20/18 2321   11/20/18 2215  azithromycin (ZITHROMAX) 500 mg in sodium chloride 0.9 % 250 mL IVPB     500 mg 250 mL/hr over 60 Minutes Intravenous  Once 11/20/18 2204 11/20/18 2352     .   Subjective  The patient is resting comfortably. No new complaints.  Objective   Vitals:  Vitals:   11/26/18 1110 11/26/18 1143  BP: 131/68 (!) 149/71  Pulse: 61 60  Resp: 20 17  Temp:  98 F (36.7 C)  SpO2: 100% 100%   Exam:  Constitutional:  . The patient is awake, and alert. No acute distress.  Respiratory:  Clear to auscultation.   Cardiovascular:  . S1-S2.   Abdomen:  Abdomen is soft, vague generalized tenderness.   Musculoskeletal:  . No edema Neurologic:  . Awake and alert.  Patient moves all extremities.   Today's Data  . Vitals, CMP, CBC  Micro Data  . Urine cultures are positive for E. Coli. , Blood cultures have had no growth, COVID-19 negative  Imaging  . CXR, MRI of L spine, CTa abdomen and pelvis   Scheduled Meds: . amiodarone  200 mg Oral Daily  . atorvastatin  10 mg Oral Daily  . carvedilol  3.125 mg Oral BID  . Chlorhexidine Gluconate Cloth  6 each Topical Q0600  . feeding supplement (ENSURE ENLIVE)  237 mL Oral BID BM  . fentaNYL      . furosemide  20 mg Oral Daily  . latanoprost  1 drop Both Eyes QHS  . lidocaine      . mouth rinse  15 mL Mouth Rinse BID  . midazolam      . mirtazapine  15 mg Oral QHS  . multivitamin  15 mL Oral Daily  . mupirocin ointment  1 application Nasal BID  . potassium chloride  10 mEq Oral Daily  . predniSONE  10 mg Oral Q breakfast  . sodium chloride flush  10-40 mL Intracatheter Q12H  . sodium chloride flush  5 mL Intracatheter Q8H   Continuous Infusions: . sodium chloride 100 mL (11/26/18 0208)  . ciprofloxacin Stopped (11/25/18 1855)  . heparin 1,100 Units/hr (11/26/18 1600)  . metronidazole Stopped (11/26/18 1304)    Principal Problem:   Acute respiratory failure with hypoxia (HCC) Active Problems:   PAF (paroxysmal atrial fibrillation) (HCC)   CKD (chronic kidney disease), stage IV (HCC)   Essential hypertension   CAP (community acquired pneumonia)   Anemia in chronic renal disease   Closed compression fracture of body of L1 vertebra (HCC)   LOS: 6 days    A & P   Acute respiratory failure with hypoxia: Resolved.  Increasing back pain with progression of T12 compression:   Discussed with Dr. Rolena Infante orthopedic surgery in the morning. I have discussed the patient with her orthopedic surgeon  Dr. Rolena Infante with [facility]. He has recommended that the patient be evaluated by IR for her compression fracture. I have consulted IR.   An MR of the Lumbar spine evaluating T10 - S1 was performed. I have discussed the results with IR. It seems that there is a compression fracture at L5 that they feel is more likely the source of her pain.  This will be addressed after infectious issues have been addressed. 11/26/2018: Back pain control seems improved.  Continue current management.  Diverticulitis with abscess:  The MRI also demonstrated diverticulitis in the rectosigmoid with an adjacent area of abscess. I have discussed possible percutaneous drainage with IR. They have requested a CT abdomen  and pelvis first. This has been ordered and the patient's antibiotics have been changed to cipro and flagyl to better cover for enteric pathogen. General surgery has evaluated the patient and will discuss the patient with IR regarding percutaneous drainage of the abscess. 11/26/2018: Patient underwent placement of colonic abscess drained by interventional radiology team.  Continue antibiotics for now.  Surgical input is appreciated.  Further management will depend on hospital course.  History of paroxysmal atrial fibrillation: Rate controlled.   We will keep patient on heparin hold apixaban  Await interventional radiology team when to resume apixaban.    Chronic diasystolic heart failure: Stable No symptoms.    AKI on CKD 3:  Renal function is back to baseline.   Continue to monitor closely.    Anemia of chronic inflammation:  Hemoglobin is 12.1 g/dL today.   Stable.    On chronic steroid therapy: Reason not clear. Sarcoidosis is documented on Epic, but patient denies this. Pursue further history, considering current diverticular abscess.  DVT prophylaxis: Heparin. Code Status: DNR  Family Communication: Disposition Plan: Home.  Dana Allan, MD. [facility] Direct contact: see  [email or web address]  7PM-7AM contact night coverage as above

## 2018-11-26 NOTE — Progress Notes (Signed)
Progress Note: General Surgery Service   Assessment/Plan: Principal Problem:   Acute respiratory failure with hypoxia (HCC) Active Problems:   PAF (paroxysmal atrial fibrillation) (HCC)   CKD (chronic kidney disease), stage IV (HCC)   Essential hypertension   CAP (community acquired pneumonia)   Anemia in chronic renal disease   Closed compression fracture of body of L1 vertebra ([place])  Diverticular abscess -going for IR drain today -continue abx   LOS: 6 days  Chief Complaint/Subjective: No pain at rest  Objective: Vital signs in last 24 hours: Temp:  [97.5 F (36.4 C)-98.2 F (36.8 C)] 97.8 F (36.6 C) (07/16 0539) Pulse Rate:  [61-66] 65 (07/16 0539) Resp:  [16-19] 19 (07/16 0539) BP: (139-159)/(58-90) 143/90 (07/16 0539) SpO2:  [100 %] 100 % (07/16 0539) Weight:  [66.7 kg] 66.7 kg (07/16 0500) Last BM Date: 11/25/18  Intake/Output from previous day: 07/15 0701 - 07/16 0700 In: 929.7 [I.V.:399.5; IV Piggyback:530.2] Out: 1350 [Urine:1350] Intake/Output this shift: No intake/output data recorded.  Lungs: nonlabored  Cardiovascular: RRR  Abd: soft, TTP LLQ  Extremities: no edema  Neuro: AOx4  Lab Results: CBC  Recent Labs    11/25/18 0236  11/25/18 1834 11/26/18 0358  WBC 11.9*  --   --  10.2  HGB 13.8   < > 13.3 12.1  12.1  HCT 45.6   < > 44.0 40.6  41.1  PLT 229  --   --  235   < > = values in this interval not displayed.   BMET Recent Labs    11/25/18 0236 11/26/18 0358  NA 136 142  K 4.9 4.5  CL 100 104  CO2 25 29  GLUCOSE 80 95  BUN 21 16  CREATININE 1.53* 1.48*  CALCIUM 8.8* 8.7*   PT/INR Recent Labs    11/25/18 1203  LABPROT 14.1  INR 1.1   ABG No results for input(s): PHART, HCO3 in the last 72 hours.  Invalid input(s): PCO2, PO2  Studies/Results:  Anti-infectives: Anti-infectives (From admission, onward)   Start     Dose/Rate Route Frequency Ordered Stop   11/24/18 1800  ciprofloxacin (CIPRO) IVPB 400 mg     400 mg 200 mL/hr over 60 Minutes Intravenous Every 24 hours 11/24/18 1516     11/24/18 1800  metroNIDAZOLE (FLAGYL) IVPB 500 mg     500 mg 100 mL/hr over 60 Minutes Intravenous Every 8 hours 11/24/18 1516     11/21/18 2100  cefTRIAXone (ROCEPHIN) 2 g in sodium chloride 0.9 % 100 mL IVPB  Status:  Discontinued     2 g 200 mL/hr over 30 Minutes Intravenous Every 24 hours 11/21/18 0421 11/24/18 1602   11/21/18 2100  azithromycin (ZITHROMAX) 500 mg in sodium chloride 0.9 % 250 mL IVPB  Status:  Discontinued     500 mg 250 mL/hr over 60 Minutes Intravenous Every 24 hours 11/21/18 0421 11/24/18 1602   11/20/18 2215  cefTRIAXone (ROCEPHIN) 1 g in sodium chloride 0.9 % 100 mL IVPB     1 g 200 mL/hr over 30 Minutes Intravenous  Once 11/20/18 2204 11/20/18 2321   11/20/18 2215  azithromycin (ZITHROMAX) 500 mg in sodium chloride 0.9 % 250 mL IVPB     500 mg 250 mL/hr over 60 Minutes Intravenous  Once 11/20/18 2204 11/20/18 2352      Medications: Scheduled Meds: . amiodarone  200 mg Oral Daily  . atorvastatin  10 mg Oral Daily  . carvedilol  3.125 mg Oral BID  .  Chlorhexidine Gluconate Cloth  6 each Topical Q0600  . feeding supplement (ENSURE ENLIVE)  237 mL Oral BID BM  . furosemide  20 mg Oral Daily  . latanoprost  1 drop Both Eyes QHS  . mouth rinse  15 mL Mouth Rinse BID  . mirtazapine  15 mg Oral QHS  . multivitamin  15 mL Oral Daily  . mupirocin ointment  1 application Nasal BID  . potassium chloride  10 mEq Oral Daily  . predniSONE  10 mg Oral Q breakfast  . sodium chloride flush  10-40 mL Intracatheter Q12H   Continuous Infusions: . sodium chloride 100 mL (11/26/18 0208)  . ciprofloxacin Stopped (11/25/18 1855)  . heparin 1,150 Units/hr (11/25/18 0851)  . metronidazole Stopped (11/26/18 0311)   PRN Meds:.sodium chloride, acetaminophen **OR** acetaminophen, HYDROcodone-acetaminophen, nitroGLYCERIN, ondansetron **OR** ondansetron (ZOFRAN) IV, polyethylene glycol, sodium chloride  flush  Mickeal Skinner, MD [facility]

## 2018-11-26 NOTE — Progress Notes (Signed)
ANTICOAGULATION CONSULT NOTE - Follow Up Consult  Pharmacy Consult for Heparin Indication: atrial fibrillation  Allergies  Allergen Reactions  . Augmentin [Amoxicillin-Pot Clavulanate] Diarrhea and Nausea Only  . Boniva [Ibandronic Acid] Diarrhea  . Codeine Other (See Comments)    "Spaced out feeling" and Lightheadedness  . Hydromorphone Nausea And Vomiting  . Latex Rash    "Burns me"  . Lisinopril Cough  . Amoxicillin   . Clavulanic Acid   . Adhesive [Tape] Itching and Rash    Patient Measurements: Height: 5' 6.5" (168.9 cm) Weight: 147 lb (66.7 kg) IBW/kg (Calculated) : 60.45 Heparin Dosing Weight: 66.7 kg  Vital Signs: Temp: 98 F (36.7 C) (07/16 1143) Temp Source: Oral (07/16 1143) BP: 149/71 (07/16 1143) Pulse Rate: 60 (07/16 1143)  Labs: Recent Labs    11/24/18 0225  11/25/18 0236  11/25/18 1203 11/25/18 1834 11/26/18 0358 11/26/18 1226  HGB 12.6   < > 13.8   < >  --  13.3 12.1  12.1 12.6  HCT 42.6   < > 45.6   < >  --  44.0 40.6  41.1 41.4  PLT 236  --  229  --   --   --  235  --   APTT 98*  --   --   --   --   --   --   --   LABPROT  --   --   --   --  14.1  --   --   --   INR  --   --   --   --  1.1  --   --   --   HEPARINUNFRC 0.64  --  0.55  --   --   --  0.66  --   CREATININE  --   --  1.53*  --   --   --  1.48*  --    < > = values in this interval not displayed.    Estimated Creatinine Clearance: 26.5 mL/min (A) (by C-G formula based on SCr of 1.48 mg/dL (H)).   Assessment: 85 y/o female c/o back/neck pain x months, found to have compression fracture and PNA; holding Eliquis (last dose 7/10 9am), started heparin for Afib. Per IR, patient qualifies for L5 kypho, but needs treatment for diverticular abscess first, and insurance approval. Heparin to continue for now.    Percutaneous drain placed today. Culture sent. Heparin drip held for several hours prior to procedure and resumed ~3-4 hrs post-procedure per IR instructions.  Heparin level was  therapeutic (0.66) this morning on 1150 units/hr. CBC stable  Goal of Therapy:  Heparin level 0.3-0.7 units/ml Monitor platelets by anticoagulation protocol: Yes   Plan:   Heparin drip resumed ~3pm at slightly reduced rate of 1100 units/hr  Continue daily Heparin Level and CBC.  Monitor for signs of bleeding.  Eliquis on hold.   Arty Baumgartner, [place] Pager: [phone] or phone: [phone] 11/26/2018 3:43 PM

## 2018-11-26 NOTE — Progress Notes (Signed)
Nutrition Follow-up  DOCUMENTATION CODES:   Not applicable  INTERVENTION:  When appropriate, resume: Ensure Enlive po BID, each supplement provides 350 kcal and 20 grams of protein  Liquid MVI with minerals  Consider appetite stimulant if poor PO continues at diet advancement  NUTRITION DIAGNOSIS:   Inadequate oral intake related to poor appetite as evidenced by (Loss of >50 lbs in last 3 years.).  Remains appropriate  GOAL:   Patient will meet greater than or equal to 90% of their needs  Unmet; NPO  MONITOR:   PO intake, Weight trends, Supplement acceptance, Diet advancement, Labs, I & O's, Skin  REASON FOR ASSESSMENT:   Malnutrition Screening Tool    ASSESSMENT:  RD working remotely.  85 y/o female PMHx CHF, CKD2, Afib, CAD, HTN, HLD, IBS, GERD. Presented via EMS at request of PCP after her CT of spine revealed progression of T12 vertebral fracture. EMS also found pt to be hypoxic w/ 02 86% and CXR suggests PNA. Admitted for management of Acute resp failure and potential surgical intervention for T12 compressi   Unable to reach pt via phone today.Per chart review pt showing some confusion; needing reminders that she was at [facility]. Patient tested positive for MRSA and Staph aureus today during surgical PCR screening.    7/16: NPO  - CT guided drain placement related to diverticular abscess IR findings: Pericolonic abscess along left pelvis. 10 Fr drain placed and 20 ml of thick purulent fluid removed.   Patient continues with poor PO prior to NPO with 0% PO x 1 recorded meal yesterday. May benefit from appetite stimulant at diet advancement.  Weights reviewed - pt with BLE edema; on IV Lasix current wt 66.7 kg 7/15 72 kg 7/14 71.2 kg 7/13 69.2 kg  Suspect wt trends attributed to fluid status  Medications and labs reviewed  UOP 1.3L x 24 hrs  Intake/Output Summary (Last 24 hours) at 11/26/2018 1708 Last data filed at 11/26/2018 1600 Gross per 24 hour  Intake  723.65 ml  Output 420 ml  Net 303.65 ml    Diet Order:   Diet Order            Diet Heart Room service appropriate? No; Fluid consistency: Thin  Diet effective now              EDUCATION NEEDS:   No education needs have been identified at this time  Skin:  Skin Assessment: Reviewed RN Assessment  Last BM:  7/15  Height:   Ht Readings from Last 1 Encounters:  11/21/18 5' 6.5" (1.689 m)    Weight:   Wt Readings from Last 1 Encounters:  11/26/18 66.7 kg    Ideal Body Weight:  60.22 kg  BMI:  Body mass index is 23.37 kg/m.  Estimated Nutritional Needs:   Kcal:  1700-1850 (25-27 kcal/kg bw)  Protein:  83- 96g Pro (1.2-1.4g/kg bw)  Fluid:  <1.2 L (MD ordered fluid restricition)   Lajuan Lines, RD, LDN  After Hours/Weekend Pager: [phone]

## 2018-11-26 NOTE — Progress Notes (Signed)
Informed by lab that patient's surgical PCR screening tested positive for MRSA and Staph aureus.  MRSA/Staph positive standing orders initiated.

## 2018-11-26 NOTE — Procedures (Signed)
Interventional Radiology Procedure:   Indications: Colonic diverticular abscess  Procedure: CT guided drain placement  Findings: Pericolonic abscess along left pelvis.   10 Fr drain placed and 20 ml of thick purulent fluid removed.   Complications: none     EBL: less than 20 ml  Plan: Send fluid for culture and follow output.     Tereasa Yilmaz R. Anselm Pancoast, MD  Pager: [phone]

## 2018-11-27 LAB — RENAL FUNCTION PANEL
Albumin: 2 g/dL — ABNORMAL LOW (ref 3.5–5.0)
Anion gap: 7 (ref 5–15)
BUN: 16 mg/dL (ref 8–23)
CO2: 29 mmol/L (ref 22–32)
Calcium: 8.5 mg/dL — ABNORMAL LOW (ref 8.9–10.3)
Chloride: 104 mmol/L (ref 98–111)
Creatinine, Ser: 1.46 mg/dL — ABNORMAL HIGH (ref 0.44–1.00)
GFR calc Af Amer: 38 mL/min — ABNORMAL LOW (ref >60)
GFR calc non Af Amer: 32 mL/min — ABNORMAL LOW (ref >60)
Glucose, Bld: 157 mg/dL — ABNORMAL HIGH (ref 70–99)
Phosphorus: 3.2 mg/dL (ref 2.5–4.6)
Potassium: 4.6 mmol/L (ref 3.5–5.1)
Sodium: 140 mmol/L (ref 135–145)

## 2018-11-27 LAB — CBC
HCT: 41.6 % (ref 36.0–46.0)
Hemoglobin: 12.2 g/dL (ref 12.0–15.0)
MCH: 27.8 pg (ref 26.0–34.0)
MCHC: 29.3 g/dL — ABNORMAL LOW (ref 30.0–36.0)
MCV: 94.8 fL (ref 80.0–100.0)
Platelets: 231 10*3/uL (ref 150–400)
RBC: 4.39 MIL/uL (ref 3.87–5.11)
RDW: 16.3 % — ABNORMAL HIGH (ref 11.5–15.5)
WBC: 11.7 10*3/uL — ABNORMAL HIGH (ref 4.0–10.5)
nRBC: 0 % (ref 0.0–0.2)

## 2018-11-27 LAB — HEMOGLOBIN AND HEMATOCRIT, BLOOD
HCT: 42.5 % (ref 36.0–46.0)
Hemoglobin: 12.5 g/dL (ref 12.0–15.0)

## 2018-11-27 LAB — HEPARIN LEVEL (UNFRACTIONATED): Heparin Unfractionated: 0.42 IU/mL (ref 0.30–0.70)

## 2018-11-27 LAB — MAGNESIUM: Magnesium: 1.9 mg/dL (ref 1.7–2.4)

## 2018-11-27 MED ORDER — APIXABAN 2.5 MG PO TABS
2.5000 mg | ORAL_TABLET | Freq: Two times a day (BID) | ORAL | Status: DC
  Administered 2018-11-27 – 2018-12-02 (×12): 2.5 mg via ORAL
  Filled 2018-11-27 (×13): qty 1

## 2018-11-27 NOTE — Progress Notes (Signed)
ANTICOAGULATION CONSULT NOTE - Follow Up Consult  Pharmacy Consult for Heparin Indication: atrial fibrillation  Allergies  Allergen Reactions  . Augmentin [Amoxicillin-Pot Clavulanate] Diarrhea and Nausea Only  . Boniva [Ibandronic Acid] Diarrhea  . Codeine Other (See Comments)    "Spaced out feeling" and Lightheadedness  . Hydromorphone Nausea And Vomiting  . Latex Rash    "Burns me"  . Lisinopril Cough  . Amoxicillin   . Clavulanic Acid   . Adhesive [Tape] Itching and Rash    Patient Measurements: Height: 5' 6.5" (168.9 cm) Weight: 150 lb (68 kg) IBW/kg (Calculated) : 60.45 Heparin Dosing Weight: 66.7 kg  Vital Signs: Temp: 97.8 F (36.6 C) (07/17 0354) Temp Source: Oral (07/17 0354) BP: 151/71 (07/17 0918) Pulse Rate: 61 (07/17 0918)  Labs: Recent Labs    11/25/18 0236  11/25/18 1203  11/26/18 0358 11/26/18 1226 11/26/18 2045 11/27/18 0418  HGB 13.8   < >  --    < > 12.1  12.1 12.6 13.4 12.2  HCT 45.6   < >  --    < > 40.6  41.1 41.4 45.3 41.6  PLT 229  --   --   --  235  --   --  231  LABPROT  --   --  14.1  --   --   --   --   --   INR  --   --  1.1  --   --   --   --   --   HEPARINUNFRC 0.55  --   --   --  0.66  --   --  0.42  CREATININE 1.53*  --   --   --  1.48*  --   --  1.46*   < > = values in this interval not displayed.    Estimated Creatinine Clearance: 26.9 mL/min (A) (by C-G formula based on SCr of 1.46 mg/dL (H)).   Assessment: 85 y/o female c/o back/neck pain x months, found to have compression fracture and PNA; holding Eliquis (last dose 7/10 9am), started heparin for Afib. Per IR, patient qualifies for L5 kypho, but needs treatment for diverticular abscess first, and insurance approval. Heparin to continue for now.  Heparin level therapeutic (0.42) on 1100 units/hr. CBC stable, no bleeding noted.  Goal of Therapy:  Heparin level 0.3-0.7 units/ml Monitor platelets by anticoagulation protocol: Yes   Plan:  Continue heparin drip at  1100 units/hr Continue daily Heparin Level and CBC Monitor for signs of bleeding. Eliquis on hold - F/U resume as able    Thank you for involving pharmacy in this patient's care.  Renold Genta, PharmD, BCPS Clinical Pharmacist Clinical phone for 11/27/2018 until 3p is [phone] 11/27/2018 10:41 AM  **Pharmacist phone directory can be found on [email or web address] listed under [place]**

## 2018-11-27 NOTE — Progress Notes (Signed)
Progress Note: General Surgery Service   Assessment/Plan: Principal Problem:   Acute respiratory failure with hypoxia (HCC) Active Problems:   PAF (paroxysmal atrial fibrillation) (HCC)   CKD (chronic kidney disease), stage IV (HCC)   Essential hypertension   CAP (community acquired pneumonia)   Anemia in chronic renal disease   Closed compression fracture of body of L1 vertebra ([place])  Diverticular abscess, IR drain 7/16 -continue abx   LOS: 7 days  Chief Complaint/Subjective: Pain around tube, no nausea, toleratied some diet, no appetite  Objective: Vital signs in last 24 hours: Temp:  [97.8 F (36.6 C)-98 F (36.7 C)] 97.8 F (36.6 C) (07/17 0354) Pulse Rate:  [59-66] 61 (07/17 0918) Resp:  [16-22] 18 (07/17 0918) BP: (113-151)/(51-75) 151/71 (07/17 0918) SpO2:  [91 %-100 %] 100 % (07/17 0918) Weight:  [68 kg] 68 kg (07/17 0354) Last BM Date: 11/25/18  Intake/Output from previous day: 07/16 0701 - 07/17 0700 In: 1078.1 [I.V.:573.2; IV Piggyback:499.9] Out: 0947 [Urine:1150; Drains:35] Intake/Output this shift: No intake/output data recorded.  Lungs: nonlabored  Cardiovascular: RRR  Abd: soft, TTP lower abdomen, no guarding  Extremities: no edema  Neuro: AOx4  Lab Results: CBC  Recent Labs    11/26/18 0358  11/26/18 2045 11/27/18 0418  WBC 10.2  --   --  11.7*  HGB 12.1  12.1   < > 13.4 12.2  HCT 40.6  41.1   < > 45.3 41.6  PLT 235  --   --  231   < > = values in this interval not displayed.   BMET Recent Labs    11/26/18 0358 11/27/18 0418  NA 142 140  K 4.5 4.6  CL 104 104  CO2 29 29  GLUCOSE 95 157*  BUN 16 16  CREATININE 1.48* 1.46*  CALCIUM 8.7* 8.5*   PT/INR Recent Labs    11/25/18 1203  LABPROT 14.1  INR 1.1   ABG No results for input(s): PHART, HCO3 in the last 72 hours.  Invalid input(s): PCO2, PO2  Studies/Results:  Anti-infectives: Anti-infectives (From admission, onward)   Start     Dose/Rate Route  Frequency Ordered Stop   11/24/18 1800  ciprofloxacin (CIPRO) IVPB 400 mg     400 mg 200 mL/hr over 60 Minutes Intravenous Every 24 hours 11/24/18 1516     11/24/18 1800  metroNIDAZOLE (FLAGYL) IVPB 500 mg     500 mg 100 mL/hr over 60 Minutes Intravenous Every 8 hours 11/24/18 1516     11/21/18 2100  cefTRIAXone (ROCEPHIN) 2 g in sodium chloride 0.9 % 100 mL IVPB  Status:  Discontinued     2 g 200 mL/hr over 30 Minutes Intravenous Every 24 hours 11/21/18 0421 11/24/18 1602   11/21/18 2100  azithromycin (ZITHROMAX) 500 mg in sodium chloride 0.9 % 250 mL IVPB  Status:  Discontinued     500 mg 250 mL/hr over 60 Minutes Intravenous Every 24 hours 11/21/18 0421 11/24/18 1602   11/20/18 2215  cefTRIAXone (ROCEPHIN) 1 g in sodium chloride 0.9 % 100 mL IVPB     1 g 200 mL/hr over 30 Minutes Intravenous  Once 11/20/18 2204 11/20/18 2321   11/20/18 2215  azithromycin (ZITHROMAX) 500 mg in sodium chloride 0.9 % 250 mL IVPB     500 mg 250 mL/hr over 60 Minutes Intravenous  Once 11/20/18 2204 11/20/18 2352      Medications: Scheduled Meds: . amiodarone  200 mg Oral Daily  . atorvastatin  10 mg Oral Daily  .  carvedilol  3.125 mg Oral BID  . Chlorhexidine Gluconate Cloth  6 each Topical Q0600  . feeding supplement (ENSURE ENLIVE)  237 mL Oral BID BM  . furosemide  20 mg Oral Daily  . latanoprost  1 drop Both Eyes QHS  . mouth rinse  15 mL Mouth Rinse BID  . mirtazapine  15 mg Oral QHS  . multivitamin  15 mL Oral Daily  . mupirocin ointment  1 application Nasal BID  . potassium chloride  10 mEq Oral Daily  . predniSONE  10 mg Oral Q breakfast  . sodium chloride flush  10-40 mL Intracatheter Q12H  . sodium chloride flush  5 mL Intracatheter Q8H   Continuous Infusions: . sodium chloride 100 mL (11/26/18 0208)  . ciprofloxacin 400 mg (11/26/18 1721)  . dextrose 5 % and 0.9% NaCl 50 mL/hr (11/26/18 1719)  . heparin 1,100 Units/hr (11/26/18 1600)  . metronidazole 500 mg (11/27/18 0229)    PRN Meds:.sodium chloride, acetaminophen **OR** acetaminophen, HYDROcodone-acetaminophen, nitroGLYCERIN, ondansetron **OR** ondansetron (ZOFRAN) IV, polyethylene glycol, sodium chloride flush  Mickeal Skinner, MD [facility]

## 2018-11-27 NOTE — Care Management Important Message (Signed)
Important Message  Patient Details  Name: Heather Benson MRN: [id number] Date of Birth: [birth year]   Medicare Important Message Given:  Yes     Memory Argue 11/27/2018, 3:13 PM

## 2018-11-27 NOTE — Progress Notes (Signed)
Physical Therapy Treatment Patient Details Name: Heather Benson MRN: [id number] DOB: 05-Feb-1932 Today's Date: 11/27/2018    History of Present Illness 85 y.o. female with history of chronic systolic heart failure paroxysmal atrial fibrillation CAD hypertension hyperlipidemia with previous vertebroplasty's has been experiencing increasing back pain over the last 2 weeks.  Patient is supposed to see orthopedic but since patient pain is worsening came to the ER.  Patient has been also noted that patient has been getting more short of breath last few days. Pt is now s/p IR drain placement secondary to diverticular abscess.     PT Comments    Pt with very slow progression towards goals and continues to be limited secondary to pain. Pt only able to tolerate sitting at EOB. Required mod A for all bed mobility tasks. Reviewed supine HEP with pt. Current recommendations appropriate. Will continue to follow acutely to maximize functional mobility independence and safety.    Follow Up Recommendations  SNF;Supervision/Assistance - 24 hour     Equipment Recommendations  Other (comment)(TBD)    Recommendations for Other Services       Precautions / Restrictions Precautions Precautions: Back;Fall Precaution Booklet Issued: No Precaution Comments: Verbally reviewed back precautions with pt.  Restrictions Weight Bearing Restrictions: No    Mobility  Bed Mobility Overal bed mobility: Needs Assistance Bed Mobility: Rolling;Sidelying to Sit;Sit to Sidelying Rolling: Mod assist Sidelying to sit: Mod assist     Sit to sidelying: Mod assist General bed mobility comments: Mod A for rolling and for trunk elevation to come to sitting. Pt only able to tolerate brief period in sitting before requesting to lie back down. Required mod A for LE assist for return to sidelying. Cues for sequencing to use log roll technique.   Transfers                 General transfer comment: Unable secondary to  pain   Ambulation/Gait                 Stairs             Wheelchair Mobility    Modified Rankin (Stroke Patients Only)       Balance                                            Cognition Arousal/Alertness: Awake/alert Behavior During Therapy: Flat affect Overall Cognitive Status: Impaired/Different from baseline Area of Impairment: Memory                     Memory: Decreased short-term memory                Exercises General Exercises - Lower Extremity Ankle Circles/Pumps: AROM;Both;20 reps;Supine Quad Sets: AROM;Both;10 reps;Supine    General Comments        Pertinent Vitals/Pain Pain Assessment: Faces Faces Pain Scale: Hurts whole lot Pain Location: back Pain Descriptors / Indicators: Discomfort;Grimacing Pain Intervention(s): Limited activity within patient's tolerance;Monitored during session;Repositioned    Home Living                      Prior Function            PT Goals (current goals can now be found in the care plan section) Acute Rehab PT Goals Patient Stated Goal: for pain to improve PT Goal Formulation: With patient Time  For Goal Achievement: 12/06/18 Potential to Achieve Goals: Fair Progress towards PT goals: Progressing toward goals(slow progression )    Frequency    Min 2X/week      PT Plan Current plan remains appropriate    Co-evaluation              AM-PAC PT "6 Clicks" Mobility   Outcome Measure  Help needed turning from your back to your side while in a flat bed without using bedrails?: A Lot Help needed moving from lying on your back to sitting on the side of a flat bed without using bedrails?: A Lot Help needed moving to and from a bed to a chair (including a wheelchair)?: Total Help needed standing up from a chair using your arms (e.g., wheelchair or bedside chair)?: Total Help needed to walk in hospital room?: Total Help needed climbing 3-5 steps with a  railing? : Total 6 Click Score: 8    End of Session   Activity Tolerance: Patient limited by pain Patient left: in bed;with call bell/phone within reach Nurse Communication: Mobility status PT Visit Diagnosis: Unsteadiness on feet (R26.81)     Time: 9371-6967 PT Time Calculation (min) (ACUTE ONLY): 15 min  Charges:  $Therapeutic Activity: 8-22 mins                     Leighton Ruff, PT, DPT  Acute Rehabilitation Services  Pager: [phone] Office: [phone]    Rudean Hitt 11/27/2018, 6:31 PM

## 2018-11-27 NOTE — Progress Notes (Signed)
Referring Physician(s): Swayze  Supervising Physician: Arne Cleveland  Patient Status:  [facility] - In-pt  Chief Complaint: Intra-abdominal fluid collection  Subjective: Resting comfortably.  Denies complaints related to drain.   Allergies: Augmentin [amoxicillin-pot clavulanate], Boniva [ibandronic acid], Codeine, Hydromorphone, Latex, Lisinopril, Amoxicillin, Clavulanic acid, and Adhesive [tape]  Medications: Prior to Admission medications   Medication Sig Start Date End Date Taking? Authorizing Provider  acetaminophen (TYLENOL) 500 MG tablet Take 1,000 mg by mouth every 8 (eight) hours as needed (for pain).    Yes [provider]  amiodarone (PACERONE) 200 MG tablet TAKE 1 TABLET BY MOUTH EVERY DAY Patient taking differently: Take 200 mg by mouth daily.  09/21/18  Yes Jettie Booze, MD  apixaban (ELIQUIS) 2.5 MG TABS tablet Take 1 tablet (2.5 mg total) by mouth 2 (two) times daily. PLEASE START ON 02/03/18 AT 9PM 02/03/18  Yes Bonnielee Haff, MD  atorvastatin (LIPITOR) 10 MG tablet Take 1 tablet (10 mg total) by mouth daily. 09/08/17  Yes Jettie Booze, MD  carvedilol (COREG) 3.125 MG tablet Take 1 tablet (3.125 mg total) by mouth 2 (two) times daily. 06/30/18 11/20/18 Yes Dunn, Dayna N, PA-C  furosemide (LASIX) 20 MG tablet Take 1 tablet (20 mg total) by mouth daily. 02/03/18  Yes Bonnielee Haff, MD  HYDROcodone-acetaminophen (NORCO/VICODIN) 5-325 MG tablet Take 1 tablet by mouth 2 (two) times daily as needed for pain.   Yes [provider]  latanoprost (XALATAN) 0.005 % ophthalmic solution Place 1 drop into both eyes at bedtime. 08/11/14  Yes [provider]  mirtazapine (REMERON) 15 MG tablet Take 15 mg by mouth at bedtime.   Yes [provider]  nitroGLYCERIN (NITROSTAT) 0.4 MG SL tablet Place 1 tablet (0.4 mg total) under the tongue every 5 (five) minutes as needed for chest pain. 03/20/18 11/20/18 Yes Weaver, Scott T, PA-C    polyethylene glycol (MIRALAX / GLYCOLAX) packet Take 17 g by mouth daily as needed. 02/03/18  Yes Bonnielee Haff, MD  potassium chloride (K-DUR) 10 MEQ tablet Take 10 mEq by mouth daily.   Yes [provider]  predniSONE (DELTASONE) 10 MG tablet Take 10 mg by mouth daily with breakfast.   Yes [provider]  Respiratory Therapy Supplies (FLUTTER) DEVI Use as directed. 02/06/16   Mannam, Hart Robinsons, MD     Vital Signs: BP (!) 151/71 (BP Location: Left Arm)    Pulse 61    Temp 97.8 F (36.6 C) (Oral)    Resp 18    Ht 5' 6.5" (1.689 m)    Wt 150 lb (68 kg)    SpO2 100%    BMI 23.85 kg/m   Physical Exam  NAD, alert Abdomen:  Soft, non-tender.  LLQ drain in place.  Small amount of bloody output in bulb.   Imaging: Ct Abdomen Pelvis Wo Contrast  Result Date: 11/25/2018 CLINICAL DATA:  Diverticulitis in the rectosigmoid colon with adjacent area of abscess EXAM: CT ABDOMEN AND PELVIS WITHOUT CONTRAST TECHNIQUE: Multidetector CT imaging of the abdomen and pelvis was performed following the standard protocol without IV contrast. COMPARISON:  MRI 11/24/2018, CT 10/15/2017 FINDINGS: Lower chest: Lung bases demonstrate dependent atelectasis. Mild bronchiectasis in the right middle lobe and bilateral lung bases with mild bronchial wall thickening. Borderline to mild cardiomegaly. Dense mitral calcification. Coronary vascular calcifications. Hepatobiliary: Liver appears increased in density. Small stones in the gallbladder. No biliary dilatation. Pancreas: Unremarkable. No pancreatic ductal dilatation or surrounding inflammatory changes. Spleen: Within normal limits Adrenals/Urinary  Tract: Adrenal glands are within normal limits. Kidneys show no hydronephrosis. Multiple bilateral renal cysts, many of which are exophytic. 17 mm lower pole slightly complex cyst demonstrates increased density values internally. Urinary bladder is unremarkable Stomach/Bowel: Stomach nonenlarged. Small hiatal hernia.  No dilated small. Negative appendix. Extensive diverticular disease of the sigmoid colon. Mild wall thickening of the sigmoid colon but without much inflammatory change. 4.2 x 3.7 cm gas and fluid collection along the left aspect of the sigmoid colon, new as compared with prior CT from 10/15/2017 and presumably representing a diverticular abscess. Vascular/Lymphatic: Extensive aortic atherosclerosis. No significantly enlarged lymph nodes. Reproductive: Status post hysterectomy. No adnexal masses. Other: Negative for free air or free fluid. Musculoskeletal: Kyphosis of the spine. Severe chronic compression fracture T12. Treated compression fractures L1 through L4. Acute fracture through the superior endplate L5, without significant loss of vertebral body height. Healing sacral fracture. IMPRESSION: 1. Extensive sigmoid colon diverticular disease. Mild wall thickening and soft tissue thickening at the sigmoid colon. 4.2 x 3.7 cm gas and fluid collection along the left aspect of the sigmoid colon, new since CT from 10/15/2017 and presumably representing diverticular abscess. 2. Acute fracture through superior endplate of L5. Multiple chronic compression fractures of the thoracic and lumbar spine 3. Slightly dense liver, possibly related to amiodarone use 4. 17 mm mass off the lower pole right kidney, grossly stable in size but slightly more complex in the interim, with increased density values. This could be due to interval hemorrhage or proteinaceous debris. Attention on follow-up imaging suggested. Electronically Signed   By: Donavan Foil M.D.   On: 11/25/2018 02:48   Mr Lumbar Spine Wo Contrast  Result Date: 11/24/2018 CLINICAL DATA:  T12 compression fracture. EXAM: MRI LUMBAR SPINE WITHOUT CONTRAST TECHNIQUE: Multiplanar, multisequence MR imaging of the lumbar spine was performed. No intravenous contrast was administered. COMPARISON:  11/20/2018 CT FINDINGS: Segmentation:  Standard lumbar numbering Alignment:  Kyphosis at the thoracolumbar junction related to chronic compression fractures. Vertebrae: Acute appearing compression fracture at L5 with marrow edema in the upper body where there is a horizontal fracture plane. Remote T12 compression fracture with unchanged advanced height loss and mild residual edema when compared to prior. Remote compression fracture at L1-L4 with completed healing at these levels. Cement augmentation performed at L1-L3. Sacral insufficiency fracture at S2 and bilateral sacral ala which is new from prior but only with minimal residual edema. Conus medullaris and cauda equina: Conus extends to the L2 level. Conus and cauda equina appear normal. Paraspinal and other soft tissues: Renal cystic intensities. Severe atrophy of intrinsic back muscles. Disc levels: Usual degenerative changes for age.  No degenerative impingement These results were called by telephone at the time of interpretation on 11/24/2018 at 11:43 am to Dr. Benny Lennert , who verbally acknowledged these results. IMPRESSION: 1. L5 acute or early subacute compression fracture, suspect this is the most symptomatic level. 2. Remote T12 compression fracture with similar height loss to September 2019 and only mild residual edema. 3. Interval but nonacute bilateral sacral ala and S2 insufficiency fractures. 4. Additional incidental finding on lumbar spine CT from 4 days ago, there is a partially covered mass in the left hemipelvis favoring diverticular abscess. Recommend abdomen and pelvis CT. Electronically Signed   By: Monte Fantasia M.D.   On: 11/24/2018 11:43   Ct Image Guided Drainage By Percutaneous Catheter  Result Date: 11/26/2018 INDICATION: 85 year old with a colonic diverticular abscess. Plan for CT-guided drain placement. EXAM: CT GUIDED DRAINAGE OF PELVIC  ABSCESS MEDICATIONS: Moderate sedation ANESTHESIA/SEDATION: 1.5 mg IV Versed 150 mcg IV Fentanyl Moderate Sedation Time:  33 minutes The patient was continuously monitored  during the procedure by the interventional radiology nurse under my direct supervision. COMPLICATIONS: None immediate. TECHNIQUE: Informed written consent was obtained from the patient after a thorough discussion of the procedural risks, benefits and alternatives. All questions were addressed. Maximal Sterile Barrier Technique was utilized including caps, mask, sterile gowns, sterile gloves, sterile drape, hand hygiene and skin antiseptic. A timeout was performed prior to the initiation of the procedure. PROCEDURE: Patient was supine on the CT scanner and images through the pelvis were obtained. The abscess along the left pelvic sidewall was targeted. The left lower abdomen/pelvic region was prepped with chlorhexidine and sterile field was created. Skin and soft tissues were anesthetized with 1% lidocaine. Using CT guidance, 18 gauge trocar needle was directed into the abscess collection. Wire was advanced into the abscess. Follow up CT images were obtained to confirm the wire was not traversing bowel or large vessel. Tract was dilated to accommodate a 10.2 [place] multipurpose drain. 20 mL of thick bloody purulent fluid was aspirated from the collection. Follow up CT images were obtained. The drain was sutured to skin and attached to a suction bulb. FINDINGS: Irregular air-fluid collection in the left pelvis between the left external iliac vessels and the sigmoid colon. Drain was successfully advanced into the collection and 20 mL of purulent fluid was removed. Small hematoma along the anterior abdominal wall at the catheter entrance site. This small hematoma did not significantly change on the follow up imaging. IMPRESSION: CT-guided placement of a drain in the left pelvic abscess. Electronically Signed   By: Markus Daft M.D.   On: 11/26/2018 14:50    Labs:  CBC: Recent Labs    11/24/18 0225  11/25/18 0236  11/26/18 0358 11/26/18 1226 11/26/18 2045 11/27/18 0418 11/27/18 1046  WBC 10.6*  --  11.9*  --   10.2  --   --  11.7*  --   HGB 12.6   < > 13.8   < > 12.1   12.1 12.6 13.4 12.2 12.5  HCT 42.6   < > 45.6   < > 40.6   41.1 41.4 45.3 41.6 42.5  PLT 236  --  229  --  235  --   --  231  --    < > = values in this interval not displayed.    COAGS: Recent Labs    01/26/18 1016  11/22/18 1310 11/22/18 2219 11/23/18 0337 11/24/18 0225 11/25/18 1203  INR 1.07  --   --   --   --   --  1.1  APTT 28   < > 59* 100* 71* 98*  --    < > = values in this interval not displayed.    BMP: Recent Labs    11/23/18 0337 11/25/18 0236 11/26/18 0358 11/27/18 0418  NA 141 136 142 140  K 5.0 4.9 4.5 4.6  CL 107 100 104 104  CO2 26 25 29 29   GLUCOSE 85 80 95 157*  BUN 28* 21 16 16   CALCIUM 7.9* 8.8* 8.7* 8.5*  CREATININE 1.59* 1.53* 1.48* 1.46*  GFRNONAA 29* 31* 32* 32*  GFRAA 34* 36* 37* 38*    LIVER FUNCTION TESTS: Recent Labs    01/29/18 0458  09/24/18 1102 11/20/18 2216 11/21/18 0517 11/27/18 0418  BILITOT 0.8  --  0.8 1.2 1.2  --   AST  28  --  38* 25 24  --   ALT 30  --  33 18 15  --   ALKPHOS 103  --  322* 101 103  --   PROT 4.6*  --  5.7* 5.8* 5.3*  --   ALBUMIN 2.1*   < > 3.1* 2.2* 2.1* 2.0*   < > = values in this interval not displayed.    Assessment and Plan: Intra-abdominal fluid collection s/p drain placement 11/26/18 LLQ drain placed by Dr. Anselm Pancoast yesterday.  Drain intact today.  Small amount of bloody output.  Culture pending.  WBC stable, slight increase. Afebrile. On Cipro and flagyl  Back pain Patient sitting up in bed comfortably.   Moves freely.  Approved for kyphoplasty/vertebroplasty if unable to achieve pain control with conservative measures, however on hold due to current infection.  Discussed with Dr. Marthenia Rolling.  Continue with current intervention for acute infection.  Consideration of vertebral augmentation procedure as an outpatient once recovered.  Electronically Signed: Docia Barrier, PA 11/27/2018, 2:25 PM   I spent a total of  15 Minutes at the the patient's bedside AND on the patient's hospital floor or unit, greater than 50% of which was counseling/coordinating care for intra-abdominal fluid collection, back pain.

## 2018-11-27 NOTE — Progress Notes (Signed)
PROGRESS NOTE  Heather Benson IRC:[id number] DOB: [birth year] DOA: 11/20/2018 PCP: [name], MD  Brief History   Heather Benson is a 85 y.o. female with history of chronic systolic heart failure paroxysmal atrial fibrillation CAD hypertension hyperlipidemia with previous vertebroplasty's has been experiencing increasing back pain over the last 2 weeks.  Patient is supposed to see orthopedic but since patient pain is worsening came to the ER.  Patient has been also noted that patient has been getting more short of breath last few days.  ED Course: In the ER patient was found to be febrile and hypoxic with temperature 100 F chest x-ray shows opacity concerning for pneumonia.  Patient had a CT C-spine L-spine and T-spine.  Shows progression of the T12 vertebral body compression fracture with some retropulsion.  Given the fever hypoxia and chest x-ray showing infiltrates patient was started on antibiotics for pneumonia.  Labs showed WBC count of 14 creatinine 2 platelets 265.  The patient was admitted to a medical bed. She has been started on Rocephin and zithromax for a pneumonia. She has been continued on her home medications as possible.   I have discussed the patient with her orthopedic surgeon Dr. Rolena Infante with [facility]. He has recommended that the patient be evaluated by IR for her compression fracture. I have consulted IR. An MR of the Lumbar spine evaluating T10 - S1 was performed. I have discussed the results with IR. It seems that there is a compression fracture at L5 that they feel is more likely the source of her pain. They also have identified diverticulitis in the rectosigmoid with an adjacent area of abscess. I have discussed possible percutaneous drainage with IR. They have requested a CT abdomen and pelvis first. This has been ordered and the patient's antibiotics have been changed to cipro and flagyl to better cover for enteric pathogen. CT abdomen and pelvis again  demonstrated the diverticulitis and abscess. General surgery was consulted to evaluate the patient. They will contact IR to see if they can percutaneously drain the abscess.  11/26/2018: Patient underwent colonic abscess drain placement by interventional radiology team earlier today.  20 cc of purulent material drained.  No new changes.  Continue antibiotics for now.  Surgical input is also appreciated.  11/27/2018: Patient looks better today.  Abdominal pain has improved significantly.  Will hold off on kyphoplasty until intra-abdominal infection/diverticular abscess results.  Discussed with interventional radiology team.  Apparently, kyphoplasty/vertebroplasty was being considered.  Patient tells me that the back pain is quite tolerable.  Consultants  . Interventional Radiology . General Surgery  Procedures  . None  Antibiotics   Anti-infectives (From admission, onward)   Start     Dose/Rate Route Frequency Ordered Stop   11/24/18 1800  ciprofloxacin (CIPRO) IVPB 400 mg     400 mg 200 mL/hr over 60 Minutes Intravenous Every 24 hours 11/24/18 1516     11/24/18 1800  metroNIDAZOLE (FLAGYL) IVPB 500 mg     500 mg 100 mL/hr over 60 Minutes Intravenous Every 8 hours 11/24/18 1516     11/21/18 2100  cefTRIAXone (ROCEPHIN) 2 g in sodium chloride 0.9 % 100 mL IVPB  Status:  Discontinued     2 g 200 mL/hr over 30 Minutes Intravenous Every 24 hours 11/21/18 0421 11/24/18 1602   11/21/18 2100  azithromycin (ZITHROMAX) 500 mg in sodium chloride 0.9 % 250 mL IVPB  Status:  Discontinued     500 mg 250 mL/hr over 60 Minutes Intravenous  Every 24 hours 11/21/18 0421 11/24/18 1602   11/20/18 2215  cefTRIAXone (ROCEPHIN) 1 g in sodium chloride 0.9 % 100 mL IVPB     1 g 200 mL/hr over 30 Minutes Intravenous  Once 11/20/18 2204 11/20/18 2321   11/20/18 2215  azithromycin (ZITHROMAX) 500 mg in sodium chloride 0.9 % 250 mL IVPB     500 mg 250 mL/hr over 60 Minutes Intravenous  Once 11/20/18 2204 11/20/18  2352     .   Subjective  The patient is resting comfortably. No new complaints.  Objective   Vitals:  Vitals:   11/26/18 2141 11/27/18 0354  BP: (!) 148/73 139/75  Pulse: 63 65  Resp:  20  Temp:  97.8 F (36.6 C)  SpO2:  92%   Exam:  Constitutional:  . The patient is awake, and alert. No acute distress. Respiratory:  Clear to auscultation.   Cardiovascular:  . S1-S2.   Abdomen:  Abdomen is soft, abdominal tenderness has improved significantly. Musculoskeletal:  . No edema Neurologic:  . Awake and alert.  Patient moves all extremities.   Today's Data  . Vitals, CMP, CBC  Micro Data  . Urine cultures are positive for E. Coli. , Blood cultures have had no growth, COVID-19 negative  Imaging  . CXR, MRI of L spine, CTa abdomen and pelvis   Scheduled Meds: . amiodarone  200 mg Oral Daily  . atorvastatin  10 mg Oral Daily  . carvedilol  3.125 mg Oral BID  . Chlorhexidine Gluconate Cloth  6 each Topical Q0600  . feeding supplement (ENSURE ENLIVE)  237 mL Oral BID BM  . furosemide  20 mg Oral Daily  . latanoprost  1 drop Both Eyes QHS  . mouth rinse  15 mL Mouth Rinse BID  . mirtazapine  15 mg Oral QHS  . multivitamin  15 mL Oral Daily  . mupirocin ointment  1 application Nasal BID  . potassium chloride  10 mEq Oral Daily  . predniSONE  10 mg Oral Q breakfast  . sodium chloride flush  10-40 mL Intracatheter Q12H  . sodium chloride flush  5 mL Intracatheter Q8H   Continuous Infusions: . sodium chloride 100 mL (11/26/18 0208)  . ciprofloxacin 400 mg (11/26/18 1721)  . dextrose 5 % and 0.9% NaCl 50 mL/hr (11/26/18 1719)  . heparin 1,100 Units/hr (11/26/18 1600)  . metronidazole 500 mg (11/27/18 0229)    Principal Problem:   Acute respiratory failure with hypoxia (HCC) Active Problems:   PAF (paroxysmal atrial fibrillation) (HCC)   CKD (chronic kidney disease), stage IV (HCC)   Essential hypertension   CAP (community acquired pneumonia)   Anemia in  chronic renal disease   Closed compression fracture of body of L1 vertebra (HCC)   LOS: 7 days    A & P   Acute respiratory failure with hypoxia: Resolved.  Back pain with progression of T12 compression/L5 compression:   Discussed with Dr. Rolena Infante orthopedic surgery in the morning. I have discussed the patient with her orthopedic surgeon Dr. Rolena Infante with [facility]. He has recommended that the patient be evaluated by IR for her compression fracture. I have consulted IR.   An MR of the Lumbar spine evaluating T10 - S1 was performed. I have discussed the results with IR. It seems that there is a compression fracture at L5 that they feel is more likely the source of her pain.  This will be addressed after infectious issues have been addressed. 11/26/2018: Back pain  control seems improved.  Continue current management. 11/27/2018: Back pain is tolerable at the moment.  Hold kyphoplasty/vertebroplasty until diverticular abscess resolves.  Diverticulitis with abscess:  The MRI also demonstrated diverticulitis in the rectosigmoid with an adjacent area of abscess. I have discussed possible percutaneous drainage with IR. They have requested a CT abdomen and pelvis first. This has been ordered and the patient's antibiotics have been changed to cipro and flagyl to better cover for enteric pathogen. General surgery has evaluated the patient and will discuss the patient with IR regarding percutaneous drainage of the abscess. 11/26/2018: Patient underwent placement of colonic abscess drained by interventional radiology team.  Continue antibiotics for now.  Surgical input is appreciated.  Further management will depend on hospital course. 11/27/2018: Patient has improved significantly.  Continue current management.  History of paroxysmal atrial fibrillation: Rate controlled.   We will keep patient on heparin hold apixaban  Await interventional radiology team when to resume apixaban.   11/27/2018: Discontinue  heparin drip.  Restart apixaban.  Chronic diasystolic heart failure: Stable No symptoms.    AKI on CKD 3:  Renal function is back to baseline.   Continue to monitor closely.    Anemia of chronic inflammation:  Hemoglobin is 12.1 g/dL today.   Stable.    On chronic steroid therapy: Reason not clear. Sarcoidosis is documented on Epic, but patient denies this. Pursue further history, considering current diverticular abscess.  DVT prophylaxis: Heparin. Code Status: DNR  Family Communication: Disposition Plan: Home.  Dana Allan, MD. [facility] Direct contact: see [email or web address]  7PM-7AM contact night coverage as above

## 2018-11-28 LAB — HEMOGLOBIN AND HEMATOCRIT, BLOOD
HCT: 38.7 % (ref 36.0–46.0)
HCT: 41.6 % (ref 36.0–46.0)
HCT: 40.9 % (ref 36.0–46.0)
Hemoglobin: 11.4 g/dL — ABNORMAL LOW (ref 12.0–15.0)
Hemoglobin: 12.2 g/dL (ref 12.0–15.0)
Hemoglobin: 12.2 g/dL (ref 12.0–15.0)

## 2018-11-28 MED ORDER — PIPERACILLIN-TAZOBACTAM 3.375 G IVPB
3.3750 g | Freq: Three times a day (TID) | INTRAVENOUS | Status: DC
  Administered 2018-11-28 – 2018-11-30 (×6): 3.375 g via INTRAVENOUS
  Filled 2018-11-28 (×6): qty 50

## 2018-11-28 NOTE — Progress Notes (Signed)
PROGRESS NOTE  Heather Benson:[id number] DOB: [birth year] DOA: 11/20/2018 PCP: [name], MD  Brief History   Heather Benson is a 85 y.o. female with history of chronic systolic heart failure paroxysmal atrial fibrillation CAD hypertension hyperlipidemia with previous vertebroplasty's has been experiencing increasing back pain over the last 2 weeks.  Patient is supposed to see orthopedic but since patient pain is worsening came to the ER.  Patient has been also noted that patient has been getting more short of breath last few days.  ED Course: In the ER patient was found to be febrile and hypoxic with temperature 100 F chest x-ray shows opacity concerning for pneumonia.  Patient had a CT C-spine L-spine and T-spine.  Shows progression of the T12 vertebral body compression fracture with some retropulsion.  Given the fever hypoxia and chest x-ray showing infiltrates patient was started on antibiotics for pneumonia.  Labs showed WBC count of 14 creatinine 2 platelets 265.  The patient was admitted to a medical bed. She has been started on Rocephin and zithromax for a pneumonia. She has been continued on her home medications as possible.   I have discussed the patient with her orthopedic surgeon Dr. Rolena Infante with [facility]. He has recommended that the patient be evaluated by IR for her compression fracture. I have consulted IR. An MR of the Lumbar spine evaluating T10 - S1 was performed. I have discussed the results with IR. It seems that there is a compression fracture at L5 that they feel is more likely the source of her pain. They also have identified diverticulitis in the rectosigmoid with an adjacent area of abscess. I have discussed possible percutaneous drainage with IR. They have requested a CT abdomen and pelvis first. This has been ordered and the patient's antibiotics have been changed to cipro and flagyl to better cover for enteric pathogen. CT abdomen and pelvis again  demonstrated the diverticulitis and abscess. General surgery was consulted to evaluate the patient. They will contact IR to see if they can percutaneously drain the abscess.  11/26/2018: Patient underwent colonic abscess drain placement by interventional radiology team earlier today.  20 cc of purulent material drained.  No new changes.  Continue antibiotics for now.  Surgical input is also appreciated.  11/27/2018: Patient looks better today.  Abdominal pain has improved significantly.  Will hold off on kyphoplasty until intra-abdominal infection/diverticular abscess results.  Discussed with interventional radiology team.  Apparently, kyphoplasty/vertebroplasty was being considered.  Patient tells me that the back pain is quite tolerable.  11/28/2018: Culture from the diverticular abscess is growing Enterococcus faecalis and E. coli.  Will discontinue ciprofloxacin and Flagyl.  Will start patient on IV Zosyn.  Urine culture also grew E. coli, though, 10,000 colonies per mL.  Abdominal pain persists.  Consultants   Interventional Radiology  General Surgery  Procedures   None  Antibiotics   Anti-infectives (From admission, onward)   Start     Dose/Rate Route Frequency Ordered Stop   11/28/18 1000  piperacillin-tazobactam (ZOSYN) IVPB 3.375 g     3.375 g 12.5 mL/hr over 240 Minutes Intravenous Every 8 hours 11/28/18 0922     11/24/18 1800  ciprofloxacin (CIPRO) IVPB 400 mg  Status:  Discontinued     400 mg 200 mL/hr over 60 Minutes Intravenous Every 24 hours 11/24/18 1516 11/28/18 0922   11/24/18 1800  metroNIDAZOLE (FLAGYL) IVPB 500 mg  Status:  Discontinued     500 mg 100 mL/hr over 60 Minutes Intravenous  Every 8 hours 11/24/18 1516 11/28/18 0922   11/21/18 2100  cefTRIAXone (ROCEPHIN) 2 g in sodium chloride 0.9 % 100 mL IVPB  Status:  Discontinued     2 g 200 mL/hr over 30 Minutes Intravenous Every 24 hours 11/21/18 0421 11/24/18 1602   11/21/18 2100  azithromycin (ZITHROMAX) 500 mg  in sodium chloride 0.9 % 250 mL IVPB  Status:  Discontinued     500 mg 250 mL/hr over 60 Minutes Intravenous Every 24 hours 11/21/18 0421 11/24/18 1602   11/20/18 2215  cefTRIAXone (ROCEPHIN) 1 g in sodium chloride 0.9 % 100 mL IVPB     1 g 200 mL/hr over 30 Minutes Intravenous  Once 11/20/18 2204 11/20/18 2321   11/20/18 2215  azithromycin (ZITHROMAX) 500 mg in sodium chloride 0.9 % 250 mL IVPB     500 mg 250 mL/hr over 60 Minutes Intravenous  Once 11/20/18 2204 11/20/18 2352        Subjective  Abdominal pain. Poor appetite.  Objective   Vitals:  Vitals:   11/28/18 0643 11/28/18 1224  BP: (!) 152/69 132/63  Pulse: 71 62  Resp:  15  Temp: 98.1 F (36.7 C) 98.3 F (36.8 C)  SpO2: 100% 99%   Exam:  Constitutional:   The patient is awake, and alert. No acute distress. Respiratory:  Clear to auscultation.   Cardiovascular:   S1-S2.   Abdomen:  Abdomen is soft, abdominal tenderness left lower quadrant area.   Musculoskeletal:   No edema Neurologic:   Awake and alert.  Patient moves all extremities.   Today's Data   Vitals, CMP, CBC  Micro Data   Urine cultures are positive for E. Coli. , Blood cultures have had no growth, COVID-19 negative  Imaging   CXR, MRI of L spine, CTa abdomen and pelvis   Scheduled Meds:  amiodarone  200 mg Oral Daily   apixaban  2.5 mg Oral BID   atorvastatin  10 mg Oral Daily   carvedilol  3.125 mg Oral BID   Chlorhexidine Gluconate Cloth  6 each Topical Q0600   feeding supplement (ENSURE ENLIVE)  237 mL Oral BID BM   furosemide  20 mg Oral Daily   latanoprost  1 drop Both Eyes QHS   mouth rinse  15 mL Mouth Rinse BID   mirtazapine  15 mg Oral QHS   multivitamin  15 mL Oral Daily   mupirocin ointment  1 application Nasal BID   potassium chloride  10 mEq Oral Daily   predniSONE  10 mg Oral Q breakfast   sodium chloride flush  10-40 mL Intracatheter Q12H   sodium chloride flush  5 mL Intracatheter Q8H     Continuous Infusions:  sodium chloride 100 mL (11/26/18 0208)   piperacillin-tazobactam (ZOSYN)  IV 3.375 g (11/28/18 0958)    Principal Problem:   Acute respiratory failure with hypoxia (HCC) Active Problems:   PAF (paroxysmal atrial fibrillation) (HCC)   CKD (chronic kidney disease), stage IV (HCC)   Essential hypertension   CAP (community acquired pneumonia)   Anemia in chronic renal disease   Closed compression fracture of body of L1 vertebra (HCC)   LOS: 8 days    A & P   Acute respiratory failure with hypoxia: Resolved.  Back pain with progression of T12 compression/L5 compression:   Discussed with Dr. Rolena Infante orthopedic surgery in the morning. I have discussed the patient with her orthopedic surgeon Dr. Rolena Infante with [facility]. He has recommended that the patient  be evaluated by IR for her compression fracture. I have consulted IR.   An MR of the Lumbar spine evaluating T10 - S1 was performed. I have discussed the results with IR. It seems that there is a compression fracture at L5 that they feel is more likely the source of her pain.  This will be addressed after infectious issues have been addressed. 11/26/2018: Back pain control seems improved.  Continue current management. 11/27/2018: Back pain is tolerable at the moment.  Hold kyphoplasty/vertebroplasty until diverticular abscess resolves.  Diverticulitis with abscess:  The MRI also demonstrated diverticulitis in the rectosigmoid with an adjacent area of abscess. I have discussed possible percutaneous drainage with IR. They have requested a CT abdomen and pelvis first. This has been ordered and the patient's antibiotics have been changed to cipro and flagyl to better cover for enteric pathogen. General surgery has evaluated the patient and will discuss the patient with IR regarding percutaneous drainage of the abscess. 11/26/2018: Patient underwent placement of colonic abscess drained by interventional radiology team.   Continue antibiotics for now.  Surgical input is appreciated.  Further management will depend on hospital course. 11/27/2018: Patient has improved significantly.  Continue current management. 11/28/2018: Culture from the diverticulosis is growing Enterococcus faecalis and E. coli.  Will discontinue Cipro and Flagyl.  Start patient on IV Zosyn.  History of paroxysmal atrial fibrillation: Rate controlled.   We will keep patient on heparin hold apixaban  Await interventional radiology team when to resume apixaban.   11/27/2018: Discontinue heparin drip.  Restart apixaban.  Chronic diasystolic heart failure: Stable No symptoms.    AKI on CKD 3:  Renal function is back to baseline.   Continue to monitor closely.    Anemia of chronic inflammation:  Hemoglobin is 12.1 g/dL today.   Stable.    On chronic steroid therapy: Reason not clear. Sarcoidosis is documented on Epic, but patient denies this. Pursue further history, considering current diverticular abscess.  DVT prophylaxis: Heparin. Code Status: DNR  Family Communication: Disposition Plan: Home.  Dana Allan, MD. [facility] Direct contact: see [email or web address]  7PM-7AM contact night coverage as above

## 2018-11-28 NOTE — TOC Progression Note (Signed)
Transition of Care [facility]) - Progression Note    Patient Details  Name: PAULENE TAYAG MRN: [id number] Date of Birth: August 23, 1931  Transition of [facility] CM/SW [place], LCSW Phone Number: 11/28/2018, 8:52 AM  Clinical Narrative:    CSW notified [place] that patient is not medically stable to transfer. Patient will need an updated COVID test 24-48hrs prior to discharge.    Expected Discharge Plan: [place] Barriers to Discharge: Continued Medical Work up  Expected Discharge Plan and Services Expected Discharge Plan: [place] In-house Referral: Clinical Social Work Discharge Planning Services: CM Consult Post Acute Care Choice: [place] arrangements for the past 2 months: Single Family Home                 DME Arranged: N/A(owns Air traffic controller) DME Agency: NA       HH Arranged: NA HH Agency: NA         Social Determinants of Health (SDOH) Interventions    Readmission Risk Interventions Readmission Risk Prevention Plan 11/24/2018 11/24/2018  Transportation Screening Complete Complete  PCP or Specialist Appt within 3-5 Days - Complete  HRI or [place] - Complete  Social Work Consult for [place] Planning/Counseling - Complete  Palliative Care Screening - Not Applicable  Medication Review Press photographer) - Complete  Some recent data might be hidden

## 2018-11-28 NOTE — Progress Notes (Signed)
Patient anxious, verbalized that she want to go home. Writer explained that she is not medically ready to be discharged and called patient's family. Patient was able to talk with her son and her husband and Probation officer gave an update on patient. Will continue to monitor.

## 2018-11-28 NOTE — Progress Notes (Signed)
Patient ID: Heather Benson, female   DOB: 1931/05/26, 85 y.o.   MRN: [id number] [facility] Surgery Progress Note:   * No surgery found *  Subjective: Mental status is clear Objective: Vital signs in last 24 hours: Temp:  [98.1 F (36.7 C)-98.7 F (37.1 C)] 98.1 F (36.7 C) (07/18 0643) Pulse Rate:  [63-71] 71 (07/18 0643) Resp:  [18] 18 (07/17 1554) BP: (113-152)/(55-69) 152/69 (07/18 0643) SpO2:  [100 %] 100 % (07/18 0643)  Intake/Output from previous day: 07/17 0701 - 07/18 0700 In: 1100.2 [P.O.:480; I.V.:120.2; IV Piggyback:500] Out: 410 [Urine:400; Drains:10] Intake/Output this shift: Total I/O In: 230 [P.O.:180; IV Piggyback:50] Out: -   Physical Exam: Work of breathing is not labored.  Feeling better after drainage.    Lab Results:  Results for orders placed or performed during the hospital encounter of 11/20/18 (from the past 48 hour(s))  Hemoglobin and hematocrit, blood     Status: None   Collection Time: 11/26/18 12:26 PM  Result Value Ref Range   Hemoglobin 12.6 12.0 - 15.0 g/dL   HCT 41.4 36.0 - 46.0 %    Comment: Performed at [facility], [place] [address], [place], [place] [address]  Aerobic/Anaerobic Culture (surgical/deep wound)     Status: None (Preliminary result)   Collection Time: 11/26/18  1:48 PM   Specimen: Abscess  Result Value Ref Range   Specimen Description ABSCESS PERICOLONIC    Special Requests NONE    Gram Stain      FEW WBC PRESENT, PREDOMINANTLY PMN FEW GRAM POSITIVE RODS FEW GRAM POSITIVE COCCI IN CLUSTERS    Culture      MODERATE ENTEROCOCCUS FAECALIS FEW ESCHERICHIA COLI SUSCEPTIBILITIES TO FOLLOW Performed at [facility], [place] [address], [place], [place] [address]    Report Status PENDING   Hemoglobin and hematocrit, blood     Status: None   Collection Time: 11/26/18  8:45 PM  Result Value Ref Range   Hemoglobin 13.4 12.0 - 15.0 g/dL   HCT 45.3 36.0 - 46.0 %    Comment: Performed at [facility],  [place] [address], [place], [place] [address]  Heparin level (unfractionated)     Status: None   Collection Time: 11/27/18  4:18 AM  Result Value Ref Range   Heparin Unfractionated 0.42 0.30 - 0.70 IU/mL    Comment: (NOTE) If heparin results are below expected values, and patient dosage has  been confirmed, suggest follow up testing of antithrombin III levels. Performed at [facility], [place] [address] [place], [place] [address]   CBC     Status: Abnormal   Collection Time: 11/27/18  4:18 AM  Result Value Ref Range   WBC 11.7 (H) 4.0 - 10.5 K/uL   RBC 4.39 3.87 - 5.11 MIL/uL   Hemoglobin 12.2 12.0 - 15.0 g/dL   HCT 41.6 36.0 - 46.0 %   MCV 94.8 80.0 - 100.0 fL   MCH 27.8 26.0 - 34.0 pg   MCHC 29.3 (L) 30.0 - 36.0 g/dL   RDW 16.3 (H) 11.5 - 15.5 %   Platelets 231 150 - 400 K/uL   nRBC 0.0 0.0 - 0.2 %    Comment: Performed at [facility], [place] [address] [place], [place] [address]  Renal function panel     Status: Abnormal   Collection Time: 11/27/18  4:18 AM  Result Value Ref Range   Sodium 140 135 - 145 mmol/L   Potassium 4.6  3.5 - 5.1 mmol/L   Chloride 104 98 - 111 mmol/L   CO2 29 22 - 32 mmol/L   Glucose, Bld 157 (H) 70 - 99 mg/dL   BUN 16 8 - 23 mg/dL   Creatinine, Ser 1.46 (H) 0.44 - 1.00 mg/dL   Calcium 8.5 (L) 8.9 - 10.3 mg/dL   Phosphorus 3.2 2.5 - 4.6 mg/dL   Albumin 2.0 (L) 3.5 - 5.0 g/dL   GFR calc non Af Amer 32 (L) >60 mL/min   GFR calc Af Amer 38 (L) >60 mL/min   Anion gap 7 5 - 15    Comment: Performed at [place] [address] [place], [place] [address]  Magnesium     Status: None   Collection Time: 11/27/18  4:18 AM  Result Value Ref Range   Magnesium 1.9 1.7 - 2.4 mg/dL    Comment: Performed at [facility] [address] [place], [place] [address]  Hemoglobin and hematocrit, blood     Status: None   Collection Time: 11/27/18 10:46 AM  Result Value Ref Range   Hemoglobin 12.5 12.0 - 15.0 g/dL   HCT 42.5 36.0 - 46.0 %     Comment: Performed at [facility], [place] [address], [place], [place] [address]  Hemoglobin and hematocrit, blood     Status: Abnormal   Collection Time: 11/28/18 12:55 AM  Result Value Ref Range   Hemoglobin 11.4 (L) 12.0 - 15.0 g/dL   HCT 38.7 36.0 - 46.0 %    Comment: Performed at [facility], [place] [address], [place], [place] [address]  Hemoglobin and hematocrit, blood     Status: None   Collection Time: 11/28/18 10:27 AM  Result Value Ref Range   Hemoglobin 12.2 12.0 - 15.0 g/dL   HCT 41.6 36.0 - 46.0 %    Comment: Performed at [facility] [address] [place], [place] [address]    Radiology/Results: No results found.  Anti-infectives: Anti-infectives (From admission, onward)   Start     Dose/Rate Route Frequency Ordered Stop   11/28/18 1000  piperacillin-tazobactam (ZOSYN) IVPB 3.375 g     3.375 g 12.5 mL/hr over 240 Minutes Intravenous Every 8 hours 11/28/18 0922     11/24/18 1800  ciprofloxacin (CIPRO) IVPB 400 mg  Status:  Discontinued     400 mg 200 mL/hr over 60 Minutes Intravenous Every 24 hours 11/24/18 1516 11/28/18 0922   11/24/18 1800  metroNIDAZOLE (FLAGYL) IVPB 500 mg  Status:  Discontinued     500 mg 100 mL/hr over 60 Minutes Intravenous Every 8 hours 11/24/18 1516 11/28/18 0922   11/21/18 2100  cefTRIAXone (ROCEPHIN) 2 g in sodium chloride 0.9 % 100 mL IVPB  Status:  Discontinued     2 g 200 mL/hr over 30 Minutes Intravenous Every 24 hours 11/21/18 0421 11/24/18 1602   11/21/18 2100  azithromycin (ZITHROMAX) 500 mg in sodium chloride 0.9 % 250 mL IVPB  Status:  Discontinued     500 mg 250 mL/hr over 60 Minutes Intravenous Every 24 hours 11/21/18 0421 11/24/18 1602   11/20/18 2215  cefTRIAXone (ROCEPHIN) 1 g in sodium chloride 0.9 % 100 mL IVPB     1 g 200 mL/hr over 30 Minutes Intravenous  Once 11/20/18 2204 11/20/18 2321   11/20/18 2215  azithromycin (ZITHROMAX) 500 mg in sodium chloride 0.9 % 250 mL IVPB     500 mg 250 mL/hr over 60  Minutes  Intravenous  Once 11/20/18 2204 11/20/18 2352      Assessment/Plan: Problem List: Patient Active Problem List   Diagnosis Date Noted  . Acute respiratory failure with hypoxia ([place]) 11/20/2018  . Chronic kidney disease-mineral and bone disorder 10/20/2018  . Weight loss 10/20/2018  . Osteoporosis 10/20/2018  . Dilated cardiomyopathy ([place]) 03/20/2018  . Non-ST elevation (NSTEMI) myocardial infarction ([place])   . Sepsis ([place]) 01/26/2018  . Closed compression fracture of body of L1 vertebra ([place]) 10/22/2017  . History of herpes zoster 10/15/2017  . Kidney disease 09/05/2017  . Diverticulitis of colon with hemorrhage 12/25/2016  . Acute lower UTI 12/25/2016  . Aortic atherosclerosis ([place]) 12/25/2016  . Chronic combined systolic and diastolic heart failure ([place])   . CAP (community acquired pneumonia) 12/13/2015  . Elevated troponin 12/13/2015  . Anemia in chronic renal disease 12/13/2015  . Hyponatremia 12/13/2015  . DOE (dyspnea on exertion) 03/06/2015  . Bradycardia 08/23/2014  . Essential hypertension 08/23/2014  . Lightheadedness 08/23/2014  . Fatigue 06/22/2014  . PAF (paroxysmal atrial fibrillation) ([place]) 01/27/2014  . CKD (chronic kidney disease), stage IV ([place]) 01/27/2014  . Swelling of limb 04/01/2012  . Edema leg 04/01/2012    Improvement after drainage of abscess * No surgery found *    LOS: 8 days   Matt B. Hassell Done, MD, [facility] [phone] beeper [phone]  11/28/2018 11:49 AM

## 2018-11-29 LAB — CBC WITH DIFFERENTIAL/PLATELET
Abs Immature Granulocytes: 0.1 10*3/uL — ABNORMAL HIGH (ref 0.00–0.07)
Basophils Absolute: 0 10*3/uL (ref 0.0–0.1)
Basophils Relative: 0 %
Eosinophils Absolute: 0.1 10*3/uL (ref 0.0–0.5)
Eosinophils Relative: 0 %
HCT: 43.2 % (ref 36.0–46.0)
Hemoglobin: 12.7 g/dL (ref 12.0–15.0)
Immature Granulocytes: 1 %
Lymphocytes Relative: 11 %
Lymphs Abs: 1.4 10*3/uL (ref 0.7–4.0)
MCH: 27.7 pg (ref 26.0–34.0)
MCHC: 29.4 g/dL — ABNORMAL LOW (ref 30.0–36.0)
MCV: 94.1 fL (ref 80.0–100.0)
Monocytes Absolute: 0.5 10*3/uL (ref 0.1–1.0)
Monocytes Relative: 4 %
Neutro Abs: 10.5 10*3/uL — ABNORMAL HIGH (ref 1.7–7.7)
Neutrophils Relative %: 84 %
Platelets: 237 10*3/uL (ref 150–400)
RBC: 4.59 MIL/uL (ref 3.87–5.11)
RDW: 16.9 % — ABNORMAL HIGH (ref 11.5–15.5)
WBC: 12.6 10*3/uL — ABNORMAL HIGH (ref 4.0–10.5)
nRBC: 0 % (ref 0.0–0.2)

## 2018-11-29 LAB — MAGNESIUM: Magnesium: 2 mg/dL (ref 1.7–2.4)

## 2018-11-29 LAB — RENAL FUNCTION PANEL
Albumin: 2.1 g/dL — ABNORMAL LOW (ref 3.5–5.0)
Anion gap: 8 (ref 5–15)
BUN: 15 mg/dL (ref 8–23)
CO2: 30 mmol/L (ref 22–32)
Calcium: 9 mg/dL (ref 8.9–10.3)
Chloride: 101 mmol/L (ref 98–111)
Creatinine, Ser: 1.74 mg/dL — ABNORMAL HIGH (ref 0.44–1.00)
GFR calc Af Amer: 30 mL/min — ABNORMAL LOW (ref >60)
GFR calc non Af Amer: 26 mL/min — ABNORMAL LOW (ref >60)
Glucose, Bld: 117 mg/dL — ABNORMAL HIGH (ref 70–99)
Phosphorus: 3.1 mg/dL (ref 2.5–4.6)
Potassium: 5.2 mmol/L — ABNORMAL HIGH (ref 3.5–5.1)
Sodium: 139 mmol/L (ref 135–145)

## 2018-11-29 LAB — HEMOGLOBIN AND HEMATOCRIT, BLOOD
HCT: 39.1 % (ref 36.0–46.0)
HCT: 45.4 % (ref 36.0–46.0)
Hemoglobin: 11.6 g/dL — ABNORMAL LOW (ref 12.0–15.0)
Hemoglobin: 13.6 g/dL (ref 12.0–15.0)

## 2018-11-29 NOTE — Progress Notes (Signed)
Referring Physician(s): Dr Benny Lennert  Supervising Physician: Arne Cleveland  Patient Status:  [facility] - In-pt  Chief Complaint:  Left pelvic abscess drain placed 7/16  Subjective:  OP minimal Pt feels some better abd pain is less  Back pain is only occasional now-- although pt not up and active  Allergies: Augmentin [amoxicillin-pot clavulanate], Boniva [ibandronic acid], Codeine, Hydromorphone, Latex, Lisinopril, Amoxicillin, Clavulanic acid, and Adhesive [tape]  Medications: Prior to Admission medications   Medication Sig Start Date End Date Taking? Authorizing Provider  acetaminophen (TYLENOL) 500 MG tablet Take 1,000 mg by mouth every 8 (eight) hours as needed (for pain).    Yes [provider]  amiodarone (PACERONE) 200 MG tablet TAKE 1 TABLET BY MOUTH EVERY DAY Patient taking differently: Take 200 mg by mouth daily.  09/21/18  Yes Jettie Booze, MD  apixaban (ELIQUIS) 2.5 MG TABS tablet Take 1 tablet (2.5 mg total) by mouth 2 (two) times daily. PLEASE START ON 02/03/18 AT 9PM 02/03/18  Yes Bonnielee Haff, MD  atorvastatin (LIPITOR) 10 MG tablet Take 1 tablet (10 mg total) by mouth daily. 09/08/17  Yes Jettie Booze, MD  carvedilol (COREG) 3.125 MG tablet Take 1 tablet (3.125 mg total) by mouth 2 (two) times daily. 06/30/18 11/20/18 Yes Dunn, Dayna N, PA-C  furosemide (LASIX) 20 MG tablet Take 1 tablet (20 mg total) by mouth daily. 02/03/18  Yes Bonnielee Haff, MD  HYDROcodone-acetaminophen (NORCO/VICODIN) 5-325 MG tablet Take 1 tablet by mouth 2 (two) times daily as needed for pain.   Yes [provider]  latanoprost (XALATAN) 0.005 % ophthalmic solution Place 1 drop into both eyes at bedtime. 08/11/14  Yes [provider]  mirtazapine (REMERON) 15 MG tablet Take 15 mg by mouth at bedtime.   Yes [provider]  nitroGLYCERIN (NITROSTAT) 0.4 MG SL tablet Place 1 tablet (0.4 mg total) under the tongue every 5 (five) minutes as needed  for chest pain. 03/20/18 11/20/18 Yes Weaver, Scott T, PA-C  polyethylene glycol (MIRALAX / GLYCOLAX) packet Take 17 g by mouth daily as needed. 02/03/18  Yes Bonnielee Haff, MD  potassium chloride (K-DUR) 10 MEQ tablet Take 10 mEq by mouth daily.   Yes [provider]  predniSONE (DELTASONE) 10 MG tablet Take 10 mg by mouth daily with breakfast.   Yes [provider]  Respiratory Therapy Supplies (FLUTTER) DEVI Use as directed. 02/06/16   Mannam, Hart Robinsons, MD     Vital Signs: BP 140/63 (BP Location: Left Arm)    Pulse (!) 59    Temp 98.1 F (36.7 C) (Oral)    Resp 16    Ht 5' 6.5" (1.689 m)    Wt 150 lb (68 kg)    SpO2 96%    BMI 23.85 kg/m   Physical Exam Vitals signs reviewed.  Skin:    General: Skin is warm and dry.     Comments: Site of left pelvic drain is Clean and dry NT No bleeding OP cloudy serous Minimal for few days  MODERATE ENTEROCOCCUS FAECALIS  FEW ESCHERICHIA COLI   Neurological:     Mental Status: She is alert.     Imaging: Ct Image Guided Drainage By Percutaneous Catheter  Result Date: 11/26/2018 INDICATION: 85 year old with a colonic diverticular abscess. Plan for CT-guided drain placement. EXAM: CT GUIDED DRAINAGE OF PELVIC ABSCESS MEDICATIONS: Moderate sedation ANESTHESIA/SEDATION: 1.5 mg IV Versed 150 mcg IV Fentanyl Moderate Sedation Time:  33 minutes The patient was continuously monitored during the procedure by  the interventional radiology nurse under my direct supervision. COMPLICATIONS: None immediate. TECHNIQUE: Informed written consent was obtained from the patient after a thorough discussion of the procedural risks, benefits and alternatives. All questions were addressed. Maximal Sterile Barrier Technique was utilized including caps, mask, sterile gowns, sterile gloves, sterile drape, hand hygiene and skin antiseptic. A timeout was performed prior to the initiation of the procedure. PROCEDURE: Patient was supine on the CT scanner and  images through the pelvis were obtained. The abscess along the left pelvic sidewall was targeted. The left lower abdomen/pelvic region was prepped with chlorhexidine and sterile field was created. Skin and soft tissues were anesthetized with 1% lidocaine. Using CT guidance, 18 gauge trocar needle was directed into the abscess collection. Wire was advanced into the abscess. Follow up CT images were obtained to confirm the wire was not traversing bowel or large vessel. Tract was dilated to accommodate a 10.2 [place] multipurpose drain. 20 mL of thick bloody purulent fluid was aspirated from the collection. Follow up CT images were obtained. The drain was sutured to skin and attached to a suction bulb. FINDINGS: Irregular air-fluid collection in the left pelvis between the left external iliac vessels and the sigmoid colon. Drain was successfully advanced into the collection and 20 mL of purulent fluid was removed. Small hematoma along the anterior abdominal wall at the catheter entrance site. This small hematoma did not significantly change on the follow up imaging. IMPRESSION: CT-guided placement of a drain in the left pelvic abscess. Electronically Signed   By: Markus Daft M.D.   On: 11/26/2018 14:50    Labs:  CBC: Recent Labs    11/24/18 0225  11/25/18 0236  11/26/18 0358  11/27/18 0418  11/28/18 0055 11/28/18 1027 11/28/18 1758 11/29/18 0441  WBC 10.6*  --  11.9*  --  10.2  --  11.7*  --   --   --   --   --   HGB 12.6   < > 13.8   < > 12.1   12.1   < > 12.2   < > 11.4* 12.2 12.2 11.6*  HCT 42.6   < > 45.6   < > 40.6   41.1   < > 41.6   < > 38.7 41.6 40.9 39.1  PLT 236  --  229  --  235  --  231  --   --   --   --   --    < > = values in this interval not displayed.    COAGS: Recent Labs    01/26/18 1016  11/22/18 1310 11/22/18 2219 11/23/18 0337 11/24/18 0225 11/25/18 1203  INR 1.07  --   --   --   --   --  1.1  APTT 28   < > 59* 100* 71* 98*  --    < > = values in this interval not  displayed.    BMP: Recent Labs    11/23/18 0337 11/25/18 0236 11/26/18 0358 11/27/18 0418  NA 141 136 142 140  K 5.0 4.9 4.5 4.6  CL 107 100 104 104  CO2 26 25 29 29   GLUCOSE 85 80 95 157*  BUN 28* 21 16 16   CALCIUM 7.9* 8.8* 8.7* 8.5*  CREATININE 1.59* 1.53* 1.48* 1.46*  GFRNONAA 29* 31* 32* 32*  GFRAA 34* 36* 37* 38*    LIVER FUNCTION TESTS: Recent Labs    01/29/18 0458  09/24/18 1102 11/20/18 2216 11/21/18 0517 11/27/18 0418  BILITOT  0.8  --  0.8 1.2 1.2  --   AST 28  --  38* 25 24  --   ALT 30  --  33 18 15  --   ALKPHOS 103  --  322* 101 103  --   PROT 4.6*  --  5.7* 5.8* 5.3*  --   ALBUMIN 2.1*   < > 3.1* 2.2* 2.1* 2.0*   < > = values in this interval not displayed.    Assessment and Plan:  Diverticular abscess drain intact OP minimal Consider re CT to evaluate collection- and drain injection prior to removal Can have KP as OP after recuperation from abscess And re evaluation-- pain is less per pt  Electronically Signed: Lavonia Drafts, PA-C 11/29/2018, 9:17 AM   I spent a total of 15 Minutes at the the patient's bedside AND on the patient's hospital floor or unit, greater than 50% of which was counseling/coordinating care for pelvis absc drain

## 2018-11-29 NOTE — Progress Notes (Signed)
PROGRESS NOTE  Heather Benson:[id number] DOB: 02-21-1932 DOA: 11/20/2018 PCP: [name], MD  Brief History   Heather Benson is a 85 y.o. female with  past medical history significant for chronic systolic heart failure, paroxysmal atrial fibrillation, CAD, hypertension, hyperlipidemia and vertebroplasty.  Patient has chronic back pain.  Patient was admitted with diverticular abscess and back pain.  Interventional radiology team has placed a drain for the diverticular abscess.  The specimen from the diverticular abscess grew Enterococcus faecalis and E. coli, both sensitive to IV Zosyn.  Patient is currently on IV Zosyn.  Kindly consult infectious disease team in the morning to help direct antibiotics management.  Vertebroplasty/kyphoplasty was initially planned for the back pain, but this will be pursued after infection is under control.  Patient can follow-up with interventional radiology team on outpatient basis.  Patient is eager to be discharged back home, but physical therapy assessment has recommended skilled nursing facility placement.  Patient was initially on IV Cipro and Flagyl, but antibiotics was changed to IV Zosyn yesterday, and patient feels a lot better today.  Consultants  . Interventional Radiology . General Surgery . Please consult infectious disease team tomorrow, 11/30/2018.  Procedures  . None  Antibiotics   Anti-infectives (From admission, onward)   Start     Dose/Rate Route Frequency Ordered Stop   11/28/18 1000  piperacillin-tazobactam (ZOSYN) IVPB 3.375 g     3.375 g 12.5 mL/hr over 240 Minutes Intravenous Every 8 hours 11/28/18 0922     11/24/18 1800  ciprofloxacin (CIPRO) IVPB 400 mg  Status:  Discontinued     400 mg 200 mL/hr over 60 Minutes Intravenous Every 24 hours 11/24/18 1516 11/28/18 0922   11/24/18 1800  metroNIDAZOLE (FLAGYL) IVPB 500 mg  Status:  Discontinued     500 mg 100 mL/hr over 60 Minutes Intravenous Every 8 hours 11/24/18 1516  11/28/18 0922   11/21/18 2100  cefTRIAXone (ROCEPHIN) 2 g in sodium chloride 0.9 % 100 mL IVPB  Status:  Discontinued     2 g 200 mL/hr over 30 Minutes Intravenous Every 24 hours 11/21/18 0421 11/24/18 1602   11/21/18 2100  azithromycin (ZITHROMAX) 500 mg in sodium chloride 0.9 % 250 mL IVPB  Status:  Discontinued     500 mg 250 mL/hr over 60 Minutes Intravenous Every 24 hours 11/21/18 0421 11/24/18 1602   11/20/18 2215  cefTRIAXone (ROCEPHIN) 1 g in sodium chloride 0.9 % 100 mL IVPB     1 g 200 mL/hr over 30 Minutes Intravenous  Once 11/20/18 2204 11/20/18 2321   11/20/18 2215  azithromycin (ZITHROMAX) 500 mg in sodium chloride 0.9 % 250 mL IVPB     500 mg 250 mL/hr over 60 Minutes Intravenous  Once 11/20/18 2204 11/20/18 2352     . Patient is currently on IV Zosyn. . IV Cipro and Flagyl were discontinued yesterday, 11/28/2018.  Subjective  Abdominal pain has improved significantly. No fever or chills.. Appetite seems to be improving. Back pain is tolerable.  Objective   Vitals:  Vitals:   11/29/18 0606 11/29/18 1148  BP: 140/63 136/64  Pulse: (!) 59 (!) 57  Resp: 16 14  Temp: 98.1 F (36.7 C) 98.1 F (36.7 C)  SpO2: 96% 98%   Exam:  Constitutional:  . The patient is awake, and alert. No acute distress. Respiratory:  Clear to auscultation.   Cardiovascular:  . S1-S2.   Abdomen:  Abdomen is soft, abdominal tenderness left lower quadrant area.   Musculoskeletal:  .  No edema Neurologic:  . Awake and alert.  Patient moves all extremities.   Today's Data  . Vitals, CMP, CBC  Micro Data  . Urine cultures are positive for E. Coli. , Blood cultures have had no growth, COVID-19 negative  Imaging  . CXR, MRI of L spine, CTa abdomen and pelvis   Scheduled Meds: . amiodarone  200 mg Oral Daily  . apixaban  2.5 mg Oral BID  . atorvastatin  10 mg Oral Daily  . carvedilol  3.125 mg Oral BID  . Chlorhexidine Gluconate Cloth  6 each Topical Q0600  . feeding  supplement (ENSURE ENLIVE)  237 mL Oral BID BM  . furosemide  20 mg Oral Daily  . latanoprost  1 drop Both Eyes QHS  . mouth rinse  15 mL Mouth Rinse BID  . mirtazapine  15 mg Oral QHS  . multivitamin  15 mL Oral Daily  . mupirocin ointment  1 application Nasal BID  . potassium chloride  10 mEq Oral Daily  . predniSONE  10 mg Oral Q breakfast  . sodium chloride flush  10-40 mL Intracatheter Q12H  . sodium chloride flush  5 mL Intracatheter Q8H   Continuous Infusions: . sodium chloride 100 mL (11/26/18 0208)  . piperacillin-tazobactam (ZOSYN)  IV 3.375 g (11/29/18 7412)    Principal Problem:   Acute respiratory failure with hypoxia (HCC) Active Problems:   PAF (paroxysmal atrial fibrillation) (HCC)   CKD (chronic kidney disease), stage IV (H[facility])   Essential hypertension   CAP (community acquired pneumonia)   Anemia in chronic renal disease   Closed compression fracture of body of L1 vertebra (HCC)   LOS: 9 days    A & P   Acute respiratory failure with hypoxia: Resolved.  Diverticulitis with particular abscess:  -MRI also demonstrated diverticulitis at the rectosigmoid with an adjacent area of abscess. -Patient is status post percutaneous drainage of the diverticular abscess.  -Specimen from the diverticular abscess has grown Enterococcus faecalis and E. coli.  -IV Cipro and Flagyl were discontinued and 11/28/2018.  -IV Zosyn was started on 11/28/2018.  -Patient feels a lot better today.  -Palliative consult infectious disease tomorrow, 11/30/2018 to assist with antibiotics management.   -Patient is eager to be discharged back home, however, physical therapy assessment has recommended skilled nursing facility.  Back pain with progression of T12 compression/L5 compression:   -MR of the Lumbar spine evaluating T10 - S1 was performed.  -IR team seems to believe that L5 compression fracture is likely responsible for the back pain. -For vertebroplasty when diverticular abscess  is controlled/resolved.  History of paroxysmal atrial fibrillation: Rate controlled.   Continue apixaban.   Continue rate control.  Chronic diasystolic heart failure: Stable No symptoms.    AKI on CKD 3:  Renal function is back to baseline.   Continue to monitor closely.    Anemia of chronic inflammation:  Hemoglobin is 12.7 g/dL today.   Stable.    On chronic steroid therapy: Reason not clear. Sarcoidosis is documented on Epic, but patient denies this. Pursue further history, considering current diverticular abscess.  DVT prophylaxis: Heparin. Code Status: DNR  Family Communication: Disposition Plan: Home.  Dana Allan, MD. [facility] Direct contact: see [email or web address]  7PM-7AM contact night coverage as above

## 2018-11-29 NOTE — Progress Notes (Signed)
Patient ID: Heather Benson, female   DOB: [birth year], 85 y.o.   MRN: [id number] [facility] Progress Note:   * No surgery found *  Subjective: Mental status is clear. No complaints. Feeling better each day.  Objective: Vital signs in last 24 hours: Temp:  [98.1 F (36.7 C)-98.4 F (36.9 C)] 98.1 F (36.7 C) (07/19 0606) Pulse Rate:  [59-63] 59 (07/19 0606) Resp:  [15-16] 16 (07/19 0606) BP: (132-141)/(57-63) 140/63 (07/19 0606) SpO2:  [96 %-99 %] 96 % (07/19 0606)  Intake/Output from previous day: 07/18 0701 - 07/19 0700 In: 530 [P.O.:420; I.V.:5; IV Piggyback:100] Out: 607 [Urine:600; Drains:7] Intake/Output this shift: No intake/output data recorded.  Physical Exam: Work of breathing is not labored.  Feeling better after drainage.  Abdomen is soft, NT/ND   Lab Results:  Results for orders placed or performed during the hospital encounter of 11/20/18 (from the past 48 hour(s))  Hemoglobin and hematocrit, blood     Status: None   Collection Time: 11/27/18 10:46 AM  Result Value Ref Range   Hemoglobin 12.5 12.0 - 15.0 g/dL   HCT 42.5 36.0 - 46.0 %    Comment: Performed at [facility] [address], [place], [place] [address]  Hemoglobin and hematocrit, blood     Status: Abnormal   Collection Time: 11/28/18 12:55 AM  Result Value Ref Range   Hemoglobin 11.4 (L) 12.0 - 15.0 g/dL   HCT 38.7 36.0 - 46.0 %    Comment: Performed at [facility], [place] [address], [place], [place] [address]  Hemoglobin and hematocrit, blood     Status: None   Collection Time: 11/28/18 10:27 AM  Result Value Ref Range   Hemoglobin 12.2 12.0 - 15.0 g/dL   HCT 41.6 36.0 - 46.0 %    Comment: Performed at [facility] [address] [place], [place] [address]  Hemoglobin and hematocrit, blood     Status: None   Collection Time: 11/28/18  5:58 PM  Result Value Ref Range   Hemoglobin 12.2 12.0 - 15.0 g/dL   HCT 40.9 36.0 - 46.0 %    Comment: Performed at [facility], [place] [address], [place], [place] [address]  Hemoglobin and hematocrit, blood     Status: Abnormal   Collection Time: 11/29/18  4:41 AM  Result Value Ref Range   Hemoglobin 11.6 (L) 12.0 - 15.0 g/dL   HCT 39.1 36.0 - 46.0 %    Comment: Performed at [facility], [place] [address], [place], [place] [address]    Radiology/Results: No results found.  Anti-infectives: Anti-infectives (From admission, onward)   Start     Dose/Rate Route Frequency Ordered Stop   11/28/18 1000  piperacillin-tazobactam (ZOSYN) IVPB 3.375 g     3.375 g 12.5 mL/hr over 240 Minutes Intravenous Every 8 hours 11/28/18 0922     11/24/18 1800  ciprofloxacin (CIPRO) IVPB 400 mg  Status:  Discontinued     400 mg 200 mL/hr over 60 Minutes Intravenous Every 24 hours 11/24/18 1516 11/28/18 0922   11/24/18 1800  metroNIDAZOLE (FLAGYL) IVPB 500 mg  Status:  Discontinued     500 mg 100 mL/hr over 60 Minutes Intravenous Every 8 hours 11/24/18 1516 11/28/18 0922   11/21/18 2100  cefTRIAXone (ROCEPHIN) 2 g in sodium chloride 0.9 % 100 mL IVPB  Status:  Discontinued     2 g 200 mL/hr over 30 Minutes Intravenous Every 24 hours 11/21/18  0421 11/24/18 1602   11/21/18 2100  azithromycin (ZITHROMAX) 500 mg in sodium chloride 0.9 % 250 mL IVPB  Status:  Discontinued     500 mg 250 mL/hr over 60 Minutes Intravenous Every 24 hours 11/21/18 0421 11/24/18 1602   11/20/18 2215  cefTRIAXone (ROCEPHIN) 1 g in sodium chloride 0.9 % 100 mL IVPB     1 g 200 mL/hr over 30 Minutes Intravenous  Once 11/20/18 2204 11/20/18 2321   11/20/18 2215  azithromycin (ZITHROMAX) 500 mg in sodium chloride 0.9 % 250 mL IVPB     500 mg 250 mL/hr over 60 Minutes Intravenous  Once 11/20/18 2204 11/20/18 2352      Assessment/Plan: Problem List: Patient Active Problem List   Diagnosis Date Noted  . Acute respiratory failure with hypoxia ([place]) 11/20/2018  . Chronic kidney disease-mineral and bone disorder 10/20/2018  . Weight loss  10/20/2018  . Osteoporosis 10/20/2018  . Dilated cardiomyopathy ([place]) 03/20/2018  . Non-ST elevation (NSTEMI) myocardial infarction ([place])   . Sepsis ([place]) 01/26/2018  . Closed compression fracture of body of L1 vertebra ([place]) 10/22/2017  . History of herpes zoster 10/15/2017  . Kidney disease 09/05/2017  . Diverticulitis of colon with hemorrhage 12/25/2016  . Acute lower UTI 12/25/2016  . Aortic atherosclerosis ([place]) 12/25/2016  . Chronic combined systolic and diastolic heart failure ([place])   . CAP (community acquired pneumonia) 12/13/2015  . Elevated troponin 12/13/2015  . Anemia in chronic renal disease 12/13/2015  . Hyponatremia 12/13/2015  . DOE (dyspnea on exertion) 03/06/2015  . Bradycardia 08/23/2014  . Essential hypertension 08/23/2014  . Lightheadedness 08/23/2014  . Fatigue 06/22/2014  . PAF (paroxysmal atrial fibrillation) ([place]) 01/27/2014  . CKD (chronic kidney disease), stage IV ([place]) 01/27/2014  . Swelling of limb 04/01/2012  . Edema leg 04/01/2012    Improvement after drainage of abscess Drain teaching Transition to PO abx (Cipro/flagyl fine) - to complete ~10d course If doing well tomorrow and tolerating PO abx, should be stable for discahrge from our standpoint   LOS: 9 days   Sharon Mt. Dema Severin, M.D. [facility]  11/29/2018 10:29 AM

## 2018-11-29 NOTE — Progress Notes (Addendum)
Son Heather Benson called and was updated.  Son is against rehab at [place] if compression fx/abcess is not attended to.  He states she will be discharged go to rehab or come home, not be able to withstand walking, sitting up, and be right back at hospital.   Son had video conference with Dr. Lavone Neri [facility].  Also spoke wit [facility].    Patient and family refuse pain medication management.  Son asked if an interventional alternative such as cementing compressing fracture, steroid  injection can help   Son requests MD call him Monday.

## 2018-11-30 DIAGNOSIS — D631 Anemia in chronic kidney disease: Secondary | ICD-10-CM

## 2018-11-30 DIAGNOSIS — Z515 Encounter for palliative care: Secondary | ICD-10-CM

## 2018-11-30 DIAGNOSIS — M549 Dorsalgia, unspecified: Secondary | ICD-10-CM

## 2018-11-30 DIAGNOSIS — Z66 Do not resuscitate: Secondary | ICD-10-CM

## 2018-11-30 DIAGNOSIS — I10 Essential (primary) hypertension: Secondary | ICD-10-CM

## 2018-11-30 DIAGNOSIS — K651 Peritoneal abscess: Secondary | ICD-10-CM

## 2018-11-30 DIAGNOSIS — N184 Chronic kidney disease, stage 4 (severe): Secondary | ICD-10-CM

## 2018-11-30 DIAGNOSIS — K5792 Diverticulitis of intestine, part unspecified, without perforation or abscess without bleeding: Secondary | ICD-10-CM

## 2018-11-30 DIAGNOSIS — S32010A Wedge compression fracture of first lumbar vertebra, initial encounter for closed fracture: Secondary | ICD-10-CM

## 2018-11-30 DIAGNOSIS — G8929 Other chronic pain: Secondary | ICD-10-CM

## 2018-11-30 DIAGNOSIS — S22080S Wedge compression fracture of T11-T12 vertebra, sequela: Secondary | ICD-10-CM

## 2018-11-30 LAB — HEMOGLOBIN AND HEMATOCRIT, BLOOD
HCT: 40.4 % (ref 36.0–46.0)
HCT: 41.7 % (ref 36.0–46.0)
Hemoglobin: 12.1 g/dL (ref 12.0–15.0)
Hemoglobin: 12.3 g/dL (ref 12.0–15.0)

## 2018-11-30 MED ORDER — AMOXICILLIN 500 MG PO CAPS
500.0000 mg | ORAL_CAPSULE | Freq: Two times a day (BID) | ORAL | Status: DC
  Administered 2018-11-30: 500 mg via ORAL
  Filled 2018-11-30: qty 1

## 2018-11-30 MED ORDER — CEPHALEXIN 500 MG PO CAPS
500.0000 mg | ORAL_CAPSULE | Freq: Two times a day (BID) | ORAL | Status: DC
  Administered 2018-11-30 – 2018-12-03 (×8): 500 mg via ORAL
  Filled 2018-11-30 (×8): qty 1

## 2018-11-30 MED ORDER — LOPERAMIDE HCL 2 MG PO CAPS
2.0000 mg | ORAL_CAPSULE | ORAL | Status: DC | PRN
  Administered 2018-11-30: 2 mg via ORAL
  Filled 2018-11-30 (×2): qty 1

## 2018-11-30 MED ORDER — AMOXICILLIN-POT CLAVULANATE 500-125 MG PO TABS
1.0000 | ORAL_TABLET | Freq: Two times a day (BID) | ORAL | Status: DC
  Administered 2018-11-30 – 2018-12-03 (×7): 500 mg via ORAL
  Filled 2018-11-30 (×7): qty 1

## 2018-11-30 NOTE — Progress Notes (Signed)
Occupational Therapy Treatment Patient Details Name: Heather Benson MRN: [id number] DOB: 16-Feb-1932 Today's Date: 11/30/2018    History of present illness 85 y.o. female with history of chronic systolic heart failure paroxysmal atrial fibrillation CAD hypertension hyperlipidemia with previous vertebroplasty's has been experiencing increasing back pain over the last 2 weeks.  Patient is supposed to see orthopedic but since patient pain is worsening came to the ER.  Patient has been also noted that patient has been getting more short of breath last few days. Pt is now s/p IR drain placement secondary to diverticular abscess.    OT comments  Upon entering the room, pt stating, " I want to go home. I'm not going to rehab." OT discussed recommendations with pt and verbalized that she would bee 24/7 caregiver and hospital bed with additional equipment if returning home. Pt reports her husband is there but he likely can't provide the assistance we are. Pt reports being agreeable to transfer into recliner chair but once set up completed. Pt reports being incontinent of BM and needing to be "cleaned up". OT educated pt on importance of notifying staff in order to get onto bed pan or BSC secondary to skin integrity concerns. Pt verbalized knowing when she needs to have BM and just used it on herself while therapist was in the room. Pt does not seem to fully understanding the amount of assistance she will need if returning home. OT continues to recommend SNF for safety.   Follow Up Recommendations  SNF;Supervision/Assistance - 24 hour    Equipment Recommendations  Other (comment)(defer to next venue of care)       Precautions / Restrictions Precautions Precautions: Back;Fall Precaution Booklet Issued: No Restrictions Weight Bearing Restrictions: No       Mobility Bed Mobility Overal bed mobility: Needs Assistance Bed Mobility: Rolling;Sidelying to Sit;Sit to Sidelying Rolling: Mod assist                    ADL either performed or assessed with clinical judgement   ADL     General ADL Comments: max A for hygiene after incontience of BM     Vision Patient Visual Report: No change from baseline            Cognition Arousal/Alertness: Awake/alert Behavior During Therapy: Flat affect Overall Cognitive Status: Impaired/Different from baseline Area of Impairment: Memory       Memory: Decreased short-term memory                    Pertinent Vitals/ Pain       Pain Assessment: Faces Faces Pain Scale: Hurts little more Pain Location: back Pain Descriptors / Indicators: Discomfort;Grimacing Pain Intervention(s): Limited activity within patient's tolerance;Monitored during session;Repositioned         Frequency  Min 2X/week        Progress Toward Goals  OT Goals(current goals can now be found in the care plan section)  Progress towards OT goals: Not progressing toward goals - comment(secondary to pain and decreased motivation)  Acute Rehab OT Goals Patient Stated Goal: for pain to improve OT Goal Formulation: With patient Time For Goal Achievement: 12/14/18 Potential to Achieve Goals: Good  Plan Discharge plan remains appropriate       AM-PAC OT "6 Clicks" Daily Activity     Outcome Measure   Help from another person eating meals?: None Help from another person taking care of personal grooming?: None Help from another person toileting, which includes using toliet,  bedpan, or urinal?: Total Help from another person bathing (including washing, rinsing, drying)?: A Lot Help from another person to put on and taking off regular upper body clothing?: A Lot Help from another person to put on and taking off regular lower body clothing?: Total 6 Click Score: 14    End of Session    OT Visit Diagnosis: Pain;Muscle weakness (generalized) (M62.81)   Activity Tolerance Patient limited by pain   Patient Left in bed;with call bell/phone within  reach   Nurse Communication          Time: 4199-1444 OT Time Calculation (min): 16 min  Charges: OT General Charges $OT Visit: 1 Visit OT Treatments $Self Care/Home Management : 8-22 mins    Darleen Crocker P 11/30/2018, 10:45 AM

## 2018-11-30 NOTE — Discharge Instructions (Signed)
Diverticulitis  Diverticulitis is when small pockets in your large intestine (colon) get infected or swollen. This causes stomach pain and watery poop (diarrhea). These pouches are called diverticula. They form in people who have a condition called diverticulosis. Follow these instructions at home: Medicines  Take over-the-counter and prescription medicines only as told by your doctor. These include: ? Antibiotics. ? Pain medicines. ? Fiber pills. ? Probiotics. ? Stool softeners.  Do not drive or use heavy machinery while taking prescription pain medicine.  If you were prescribed an antibiotic, take it as told. Do not stop taking it even if you feel better. General instructions   Follow a diet as told by your doctor.  When you feel better, your doctor may tell you to change your diet. You may need to eat a lot of fiber. Fiber makes it easier to poop (have bowel movements). Healthy foods with fiber include: ? Berries. ? Beans. ? Lentils. ? Green vegetables.  Exercise 3 or more times a week. Aim for 30 minutes each time. Exercise enough to sweat and make your heart beat faster.  Keep all follow-up visits as told. This is important. You may need to have an exam of the large intestine. This is called a colonoscopy. Contact a doctor if:  Your pain does not get better.  You have a hard time eating or drinking.  You are not pooping like normal. Get help right away if:  Your pain gets worse.  Your problems do not get better.  Your problems get worse very fast.  You have a fever.  You throw up (vomit) more than one time.  You have poop that is: ? Bloody. ? Black. ? Tarry. Summary  Diverticulitis is when small pockets in your large intestine (colon) get infected or swollen.  Take medicines only as told by your doctor.  Follow a diet as told by your doctor. This information is not intended to replace advice given to you by your health care provider. Make sure you  discuss any questions you have with your health care provider. Document Released: 10/16/2007 Document Revised: 04/11/2017 Document Reviewed: 05/16/2016 Elsevier Patient Education  [place].   Percutaneous Abscess Drain, Care After This sheet gives you information about how to care for yourself after your procedure. Your health care provider may also give you more specific instructions. If you have problems or questions, contact your health care provider. What can I expect after the procedure? After your procedure, it is common to have:  A small amount of bruising and discomfort in the area where the drainage tube (catheter) was placed.  Sleepiness and fatigue. This should go away after the medicines you were given have worn off. Follow these instructions at home: Incision care  Follow instructions from your health care provider about how to take care of your incision. Make sure you: ? Wash your hands with soap and water before you change your bandage (dressing). If soap and water are not available, use hand sanitizer. ? Change your dressing as told by your health care provider. ? Leave stitches (sutures), skin glue, or adhesive strips in place. These skin closures may need to stay in place for 2 weeks or longer. If adhesive strip edges start to loosen and curl up, you may trim the loose edges. Do not remove adhesive strips completely unless your health care provider tells you to do that.  Check your incision area every day for signs of infection. Check for: ? More redness, swelling, or  pain. ? More fluid or blood. ? Warmth. ? Pus or a bad smell. ? Fluid leaking from around your catheter (instead of fluid draining through your catheter). Catheter care   Follow instructions from your health care provider about emptying and cleaning your catheter and collection bag. You may need to clean the catheter every day so it does not clog.  If directed, write down the following  information every time you empty your bag: ? The date and time. ? The amount of drainage. General instructions  Rest at home for 1-2 days after your procedure. Return to your normal activities as told by your health care provider.  Do not take baths, swim, or use a hot tub for 24 hours after your procedure, or until your health care provider says that this is okay.  Take over-the-counter and prescription medicines only as told by your health care provider.  Keep all follow-up visits as told by your health care provider. This is important. Contact a health care provider if:  You have less than 10 mL of drainage a day for 2-3 days in a row, or as directed by your health care provider.  You have more redness, swelling, or pain around your incision area.  You have more fluid or blood coming from your incision area.  Your incision area feels warm to the touch.  You have pus or a bad smell coming from your incision area.  You have fluid leaking from around your catheter (instead of through your catheter).  You have a fever or chills.  You have pain that does not get better with medicine. Get help right away if:  Your catheter comes out.  You suddenly stop having drainage from your catheter.  You suddenly have blood in the fluid that is draining from your catheter.  You become dizzy or you faint.  You develop a rash.  You have nausea or vomiting.  You have difficulty breathing or you feel short of breath.  You develop chest pain.  You have problems with your speech or vision.  You have trouble balancing or moving your arms or legs. Summary  It is common to have a small amount of bruising and discomfort in the area where the drainage tube (catheter) was placed.  You may be directed to record the amount of drainage from the bag every time you empty it.  Follow instructions from your health care provider about emptying and cleaning your catheter and collection bag. This  information is not intended to replace advice given to you by your health care provider. Make sure you discuss any questions you have with your health care provider. Document Released: 09/13/2013 Document Revised: 04/11/2017 Document Reviewed: 03/21/2016 Elsevier Patient Education  2020 [employer].

## 2018-11-30 NOTE — Progress Notes (Signed)
PROGRESS NOTE  Heather Benson ZCH:[id number] DOB: [birth year]   PCP: [name], MD  Patient is from: Home  DOA: 11/20/2018 LOS: 20  Brief Narrative / Interim history: 85 year old female with history of chronic systolic CHF, PAF, CAD, HTN, HLD and vertebroplasty admitted with diverticular abscess and back pain.  Had drain placed by IR for diverticular abscess.  Abscess culture grew Enterococcus faecalis and E. coli both sensitive to IV Zosyn.  Discussed with ID, Dr. Linus Salmons who recommended amoxicillin for enterococcus and Keflex for E. Coli.. So she was transitioned to oral antibiotics on 11/30/2018.  Patient was evaluated by PT/OT who recommended SNF.  However, patient is eager to be discharged back home.  Family, particularly son concerned about patient being discharged home as her husband is not well enough to look after her.   Subjective: No major events overnight.  She had multiple bouts of loose bowel movements overnight.  She said this is chronic for her.  She says she takes Imodium at home.  She has no fever or leukocytosis to suggest C. difficile although she is at risk as she is on broad-spectrum antibiotic.  She denies chest pain, dyspnea or GU symptoms. She tells me she would love to go home and the state of SNF.  She says she was at a SNF in the past and she did not like it.  She says "I am 85. I just want to go home and be with my family".  Both patient and family are open to palliative care consult.  Objective: Vitals:   11/29/18 2121 11/30/18 0605 11/30/18 0606 11/30/18 1357  BP: 138/65 (!) 152/68  (!) 116/58  Pulse: 67 64  62  Resp: 17 16  16   Temp: 98.1 F (36.7 C) 98.4 F (36.9 C)  98.5 F (36.9 C)  TempSrc: Oral Oral  Oral  SpO2: 96% 98%  97%  Weight:   68 kg   Height:        Intake/Output Summary (Last 24 hours) at 11/30/2018 1439 Last data filed at 11/30/2018 1300 Gross per 24 hour  Intake 596.46 ml  Output 670 ml  Net -73.54 ml   Filed Weights   11/26/18 0500 11/27/18 0354 11/30/18 0606  Weight: 66.7 kg 68 kg 68 kg    Examination:  GENERAL: No acute distress.  Lying in bed comfortably. HEENT: MMM.  Vision and hearing grossly intact.  NECK: Supple.  No apparent JVD.  RESP:  No IWOB. Good air movement bilaterally. CVS:  RRR. Heart sounds normal.  ABD/GI/GU: Bowel sounds present.  Tenderness mainly over LLQ.  Drain over LUQ with serosanguineous fluid. MSK/EXT:  Moves extremities. No apparent deformity or edema.  SKIN: no apparent skin lesion or wound NEURO: Awake, alert and oriented appropriately.  No gross deficit.  PSYCH: Calm. Normal affect.  I have personally reviewed the following labs and images:  Radiology Studies: No results found.  Microbiology: Recent Results (from the past 240 hour(s))  Blood Culture (routine x 2)     Status: None   Collection Time: 11/20/18 10:30 PM   Specimen: BLOOD  Result Value Ref Range Status   Specimen Description BLOOD RIGHT ANTECUBITAL  Final   Special Requests   Final    BOTTLES DRAWN AEROBIC AND ANAEROBIC Blood Culture adequate volume   Culture   Final    NO GROWTH 5 DAYS Performed at [facility], [address] [place], [place] [address]    Report Status 11/25/2018 FINAL  Final  Blood Culture (routine x 2)     Status: None   Collection Time: 11/20/18 10:47 PM   Specimen: BLOOD  Result Value Ref Range Status   Specimen Description BLOOD LEFT ANTECUBITAL  Final   Special Requests   Final    BOTTLES DRAWN AEROBIC AND ANAEROBIC Blood Culture results may not be optimal due to an excessive volume of blood received in culture bottles   Culture   Final    NO GROWTH 5 DAYS Performed at [facility], [place] [address], [place], [place] [address]    Report Status 11/25/2018 FINAL  Final  SARS Coronavirus 2 (CEPHEID- Performed in [facility]), Hosp Order     Status: None   Collection Time: 11/20/18 10:50 PM   Specimen: Nasopharyngeal Swab  Result Value Ref  Range Status   SARS Coronavirus 2 NEGATIVE NEGATIVE Final    Comment: (NOTE) If result is NEGATIVE SARS-CoV-2 target nucleic acids are NOT DETECTED. The SARS-CoV-2 RNA is generally detectable in upper and lower  respiratory specimens during the acute phase of infection. The lowest  concentration of SARS-CoV-2 viral copies this assay can detect is 250  copies / mL. A negative result does not preclude SARS-CoV-2 infection  and should not be used as the sole basis for treatment or other  patient management decisions.  A negative result may occur with  improper specimen collection / handling, submission of specimen other  than nasopharyngeal swab, presence of viral mutation(s) within the  areas targeted by this assay, and inadequate number of viral copies  (<250 copies / mL). A negative result must be combined with clinical  observations, patient history, and epidemiological information. If result is POSITIVE SARS-CoV-2 target nucleic acids are DETECTED. The SARS-CoV-2 RNA is generally detectable in upper and lower  respiratory specimens dur ing the acute phase of infection.  Positive  results are indicative of active infection with SARS-CoV-2.  Clinical  correlation with patient history and other diagnostic information is  necessary to determine patient infection status.  Positive results do  not rule out bacterial infection or co-infection with other viruses. If result is PRESUMPTIVE POSTIVE SARS-CoV-2 nucleic acids MAY BE PRESENT.   A presumptive positive result was obtained on the submitted specimen  and confirmed on repeat testing.  While 2019 novel coronavirus  (SARS-CoV-2) nucleic acids may be present in the submitted sample  additional confirmatory testing may be necessary for epidemiological  and / or clinical management purposes  to differentiate between  SARS-CoV-2 and other Sarbecovirus currently known to infect humans.  If clinically indicated additional testing with an  alternate test  methodology [phone]) is advised. The SARS-CoV-2 RNA is generally  detectable in upper and lower respiratory sp ecimens during the acute  phase of infection. The expected result is Negative. Fact Sheet for Patients:  StrictlyIdeas.no Fact Sheet for Healthcare Providers: BankingDealers.co.za This test is not yet approved or cleared by the [place] FDA and has been authorized for detection and/or diagnosis of SARS-CoV-2 by FDA under an Emergency Use Authorization (EUA).  This EUA will remain in effect (meaning this test can be used) for the duration of the COVID-19 declaration under Section 564(b)(1) of the Act, 21 U.S.C. section 360bbb-3(b)(1), unless the authorization is terminated or revoked sooner. Performed at [facility], [place] [address] [place], [place] [address]   Urine culture     Status: Abnormal   Collection Time: 11/21/18  2:17 PM   Specimen: Urine, Clean Catch  Result Value Ref Range Status   Specimen Description URINE, CLEAN CATCH  Final   Special Requests NONE  Final   Culture (A)  Final    10,000 COLONIES/mL ESCHERICHIA COLI 80,000 COLONIES/mL AEROCOCCUS SPECIES Standardized susceptibility testing for this organism is not available. Performed at [facility], [place] [address], [place], [place] [address]    Report Status 11/24/2018 FINAL  Final   Organism ID, Bacteria ESCHERICHIA COLI (A)  Final      Susceptibility   Escherichia coli - MIC*    AMPICILLIN >=32 RESISTANT Resistant     CEFAZOLIN <=4 SENSITIVE Sensitive     CEFTRIAXONE <=1 SENSITIVE Sensitive     CIPROFLOXACIN >=4 RESISTANT Resistant     GENTAMICIN <=1 SENSITIVE Sensitive     IMIPENEM <=0.25 SENSITIVE Sensitive     NITROFURANTOIN <=16 SENSITIVE Sensitive     TRIMETH/SULFA <=20 SENSITIVE Sensitive     AMPICILLIN/SULBACTAM >=32 RESISTANT Resistant     PIP/TAZO 8 SENSITIVE Sensitive     Extended ESBL NEGATIVE Sensitive      * 10,000 COLONIES/mL ESCHERICHIA COLI  Surgical pcr screen     Status: Abnormal   Collection Time: 11/25/18 10:58 PM   Specimen: Nasal Mucosa; Nasal Swab  Result Value Ref Range Status   MRSA, PCR POSITIVE (A) NEGATIVE Final   Staphylococcus aureus POSITIVE (A) NEGATIVE Final    Comment: RESULT CALLED TO, READ BACK BY AND VERIFIED WITH: C ROBINSON RN 11/26/18 0125 JDW (NOTE) The Xpert SA Assay (FDA approved for NASAL specimens in patients 31 years of age and older), is one component of a comprehensive surveillance program. It is not intended to diagnose infection nor to guide or monitor treatment. Performed at [facility], [place] [address], [place], [place] [address]   Aerobic/Anaerobic Culture (surgical/deep wound)     Status: None (Preliminary result)   Collection Time: 11/26/18  1:48 PM   Specimen: Abscess  Result Value Ref Range Status   Specimen Description ABSCESS PERICOLONIC  Final   Special Requests NONE  Final   Gram Stain   Final    FEW WBC PRESENT, PREDOMINANTLY PMN FEW GRAM POSITIVE RODS FEW GRAM POSITIVE COCCI IN CLUSTERS    Culture   Final    MODERATE ENTEROCOCCUS FAECALIS FEW ESCHERICHIA COLI FEW BACTEROIDES FRAGILIS BETA LACTAMASE POSITIVE CULTURE REINCUBATED FOR BETTER GROWTH Performed at [facility], [address], [place],  [address]    Report Status PENDING  Incomplete   Organism ID, Bacteria ENTEROCOCCUS FAECALIS  Final   Organism ID, Bacteria ESCHERICHIA COLI  Final      Susceptibility   Escherichia coli - MIC*    AMPICILLIN >=32 RESISTANT Resistant     CEFAZOLIN <=4 SENSITIVE Sensitive     CEFEPIME <=1 SENSITIVE Sensitive     CEFTAZIDIME <=1 SENSITIVE Sensitive     CEFTRIAXONE <=1 SENSITIVE Sensitive     CIPROFLOXACIN >=4 RESISTANT Resistant     GENTAMICIN <=1 SENSITIVE Sensitive     IMIPENEM <=0.25 SENSITIVE Sensitive     TRIMETH/SULFA <=20 SENSITIVE Sensitive     AMPICILLIN/SULBACTAM >=32 RESISTANT Resistant     PIP/TAZO  <=4 SENSITIVE Sensitive     Extended ESBL NEGATIVE Sensitive     * FEW ESCHERICHIA COLI   Enterococcus faecalis - MIC*    AMPICILLIN <=2 SENSITIVE Sensitive     VANCOMYCIN 1 SENSITIVE Sensitive     GENTAMICIN SYNERGY SENSITIVE Sensitive     * MODERATE ENTEROCOCCUS FAECALIS    Sepsis  Labs: Invalid input(s): PROCALCITONIN, LACTICIDVEN  Urine analysis:    Component Value Date/Time   COLORURINE YELLOW 11/20/2018 1421   APPEARANCEUR CLOUDY (A) 11/20/2018 1421   APPEARANCEUR Clear 07/17/2017 1354   LABSPEC 1.017 11/20/2018 1421   PHURINE 5.0 11/20/2018 1421   GLUCOSEU NEGATIVE 11/20/2018 1421   HGBUR LARGE (A) 11/20/2018 1421   BILIRUBINUR NEGATIVE 11/20/2018 1421   BILIRUBINUR Negative 07/17/2017 1354   KETONESUR 5 (A) 11/20/2018 1421   PROTEINUR NEGATIVE 11/20/2018 1421   UROBILINOGEN 0.2 02/05/2011 0915   NITRITE POSITIVE (A) 11/20/2018 1421   LEUKOCYTESUR LARGE (A) 11/20/2018 1421    Anemia Panel: No results for input(s): VITAMINB12, FOLATE, FERRITIN, TIBC, IRON, RETICCTPCT in the last 72 hours.  Thyroid Function Tests: No results for input(s): TSH, T4TOTAL, FREET4, T3FREE, THYROIDAB in the last 72 hours.  Lipid Profile: No results for input(s): CHOL, HDL, LDLCALC, TRIG, CHOLHDL, LDLDIRECT in the last 72 hours.  CBG: No results for input(s): GLUCAP in the last 168 hours.  HbA1C: No results for input(s): HGBA1C in the last 72 hours.  BNP (last 3 results): No results for input(s): PROBNP in the last 8760 hours.  Cardiac Enzymes: No results for input(s): CKTOTAL, CKMB, CKMBINDEX, TROPONINI in the last 168 hours.  Coagulation Profile: Recent Labs  Lab 11/25/18 1203  INR 1.1    Liver Function Tests: Recent Labs  Lab 11/27/18 0418 11/29/18 1449  ALBUMIN 2.0* 2.1*   No results for input(s): LIPASE, AMYLASE in the last 168 hours. No results for input(s): AMMONIA in the last 168 hours.  Basic Metabolic Panel: Recent Labs  Lab 11/25/18 0236 11/26/18 0358  11/27/18 0418 11/29/18 1449  NA 136 142 140 139  K 4.9 4.5 4.6 5.2*  CL 100 104 104 101  CO2 25 29 29 30   GLUCOSE 80 95 157* 117*  BUN 21 16 16 15   CREATININE 1.53* 1.48* 1.46* 1.74*  CALCIUM 8.8* 8.7* 8.5* 9.0  MG  --   --  1.9 2.0  PHOS  --   --  3.2 3.1   GFR: Estimated Creatinine Clearance: 22.6 mL/min (A) (by C-G formula based on SCr of 1.74 mg/dL (H)).  CBC: Recent Labs  Lab 11/24/18 0225  11/25/18 0236  11/26/18 0358  11/27/18 0418  11/29/18 0441 11/29/18 1449 11/29/18 1821 11/30/18 0254 11/30/18 1156  WBC 10.6*  --  11.9*  --  10.2  --  11.7*  --   --  12.6*  --   --   --   NEUTROABS  --   --  8.4*  --  7.2  --   --   --   --  10.5*  --   --   --   HGB 12.6   < > 13.8   < > 12.1  12.1   < > 12.2   < > 11.6* 12.7 13.6 12.1 12.3  HCT 42.6   < > 45.6   < > 40.6  41.1   < > 41.6   < > 39.1 43.2 45.4 40.4 41.7  MCV 94.7  --  92.9  --  93.5  --  94.8  --   --  94.1  --   --   --   PLT 236  --  229  --  235  --  231  --   --  237  --   --   --    < > = values in this interval not displayed.    Procedures:  Drain placement by IR  Microbiology summarized: OMVEH-20 negative. Abscess drain culture grew enterococcus faecalis and E. coli Blood cultures negative so far  Assessment & Plan: Diverticulitis with diverticular abscess: -Status post percutaneous drain by IR -Abscess culture grew Enterococcus faecalis and E. coli both sensitive to Zosyn but no common oral agent -Transition to oral amoxicillin and Keflex per ID.  Antibiotic course depends on duration of drain. -Okay to discharge with drain in place per surgery.  Follow-up with Dr. Alvino Blood in 3 weeks. -Needs IR follow-up. -PT OT recommended SNF.  Patient not interested.  Family concerned about going home. -Palliative care consulted  Back pain likely due to compression fraction at T12/L5. -IR suggests vertebroplasty when diverticular abscess is controlled -Pain fairly controlled.  Paroxysmal A. Fib  -Continue rate control and anticoagulation  Chronic diastolic CHF: Stable -Continue home regimen  AKI on CKD 3: AKI resolved -Continue monitoring  Anemia of chronic disease: Hgb stable. -Continue monitoring  Acute respiratory failure with hypoxia: Resolved  DVT prophylaxis: On Eliquis for atrial fibrillation Code Status: DNR/DNI Family Communication: Patient and/or RN. Available if any question.  Disposition Plan: Remains inpatient.  Patient to discuss and decide final disposition.  Consultants: General surgery, IR, palliative care, ID (over the phone)   Antimicrobials: Anti-infectives (From admission, onward)   Start     Dose/Rate Route Frequency Ordered Stop   11/30/18 1200  amoxicillin (AMOXIL) capsule 500 mg     500 mg Oral Every 12 hours 11/30/18 1057 12/14/18 0959   11/30/18 1200  cephALEXin (KEFLEX) capsule 500 mg     500 mg Oral Every 12 hours 11/30/18 1057 12/14/18 0959   11/28/18 1000  piperacillin-tazobactam (ZOSYN) IVPB 3.375 g  Status:  Discontinued     3.375 g 12.5 mL/hr over 240 Minutes Intravenous Every 8 hours 11/28/18 0922 11/30/18 1057   11/24/18 1800  ciprofloxacin (CIPRO) IVPB 400 mg  Status:  Discontinued     400 mg 200 mL/hr over 60 Minutes Intravenous Every 24 hours 11/24/18 1516 11/28/18 0922   11/24/18 1800  metroNIDAZOLE (FLAGYL) IVPB 500 mg  Status:  Discontinued     500 mg 100 mL/hr over 60 Minutes Intravenous Every 8 hours 11/24/18 1516 11/28/18 0922   11/21/18 2100  cefTRIAXone (ROCEPHIN) 2 g in sodium chloride 0.9 % 100 mL IVPB  Status:  Discontinued     2 g 200 mL/hr over 30 Minutes Intravenous Every 24 hours 11/21/18 0421 11/24/18 1602   11/21/18 2100  azithromycin (ZITHROMAX) 500 mg in sodium chloride 0.9 % 250 mL IVPB  Status:  Discontinued     500 mg 250 mL/hr over 60 Minutes Intravenous Every 24 hours 11/21/18 0421 11/24/18 1602   11/20/18 2215  cefTRIAXone (ROCEPHIN) 1 g in sodium chloride 0.9 % 100 mL IVPB     1 g 200 mL/hr over 30  Minutes Intravenous  Once 11/20/18 2204 11/20/18 2321   11/20/18 2215  azithromycin (ZITHROMAX) 500 mg in sodium chloride 0.9 % 250 mL IVPB     500 mg 250 mL/hr over 60 Minutes Intravenous  Once 11/20/18 2204 11/20/18 2352      Sch Meds:  Scheduled Meds: . amiodarone  200 mg Oral Daily  . amoxicillin  500 mg Oral Q12H  . apixaban  2.5 mg Oral BID  . atorvastatin  10 mg Oral Daily  . carvedilol  3.125 mg Oral BID  . cephALEXin  500 mg Oral Q12H  . Chlorhexidine Gluconate Cloth  6 each Topical Q0600  .  feeding supplement (ENSURE ENLIVE)  237 mL Oral BID BM  . furosemide  20 mg Oral Daily  . latanoprost  1 drop Both Eyes QHS  . mouth rinse  15 mL Mouth Rinse BID  . mirtazapine  15 mg Oral QHS  . multivitamin  15 mL Oral Daily  . potassium chloride  10 mEq Oral Daily  . predniSONE  10 mg Oral Q breakfast  . sodium chloride flush  10-40 mL Intracatheter Q12H  . sodium chloride flush  5 mL Intracatheter Q8H   Continuous Infusions: . sodium chloride 100 mL (11/26/18 0208)   PRN Meds:.sodium chloride, acetaminophen **OR** acetaminophen, HYDROcodone-acetaminophen, loperamide, nitroGLYCERIN, ondansetron **OR** ondansetron (ZOFRAN) IV, polyethylene glycol, sodium chloride flush  35 minutes with more than 50% spent in reviewing records, counseling patient/family and coordinating care.  Alexey Rhoads T. [place]  If 7PM-7AM, please contact night-coverage [email or web address] Password [facility] 11/30/2018, 2:39 PM

## 2018-11-30 NOTE — Progress Notes (Signed)
Patient ID: Heather Benson, female   DOB: 1931-06-23, 85 y.o.   MRN: [id number]       Subjective: No complaints of abdominal pain.  Didn't eat much for breakfast but says she never eats much.  Wants to go home and not to a SNF.  Objective: Vital signs in last 24 hours: Temp:  [98.1 F (36.7 C)-98.4 F (36.9 C)] 98.4 F (36.9 C) (07/20 0605) Pulse Rate:  [57-67] 64 (07/20 0605) Resp:  [14-17] 16 (07/20 0605) BP: (136-152)/(64-68) 152/68 (07/20 0605) SpO2:  [96 %-98 %] 98 % (07/20 0605) Weight:  [68 kg] 68 kg (07/20 0606) Last BM Date: 11/29/18  Intake/Output from previous day: 07/19 0701 - 07/20 0700 In: 257.1 [I.V.:60.7; IV Piggyback:181.4] Out: 470 [Urine:450; Drains:20] Intake/Output this shift: No intake/output data recorded.  PE: Abd: soft, NT, ND, +BS, drain in place with feculent drainage c/w fistula  Lab Results:  Recent Labs    11/29/18 1449 11/29/18 1821 11/30/18 0254  WBC 12.6*  --   --   HGB 12.7 13.6 12.1  HCT 43.2 45.4 40.4  PLT 237  --   --    BMET Recent Labs    11/29/18 1449  NA 139  K 5.2*  CL 101  CO2 30  GLUCOSE 117*  BUN 15  CREATININE 1.74*  CALCIUM 9.0   PT/INR No results for input(s): LABPROT, INR in the last 72 hours. CMP     Component Value Date/Time   NA 139 11/29/2018 1449   NA 144 11/20/2017 1211   K 5.2 (H) 11/29/2018 1449   CL 101 11/29/2018 1449   CO2 30 11/29/2018 1449   GLUCOSE 117 (H) 11/29/2018 1449   BUN 15 11/29/2018 1449   BUN 31 (H) 11/20/2017 1211   CREATININE 1.74 (H) 11/29/2018 1449   CREATININE 2.03 (H) 02/27/2015 1051   CALCIUM 9.0 11/29/2018 1449   PROT 5.3 (L) 11/21/2018 0517   PROT 7.0 11/20/2017 1211   ALBUMIN 2.1 (L) 11/29/2018 1449   ALBUMIN 3.8 11/20/2017 1211   AST 24 11/21/2018 0517   ALT 15 11/21/2018 0517   ALKPHOS 103 11/21/2018 0517   BILITOT 1.2 11/21/2018 0517   BILITOT 1.0 11/20/2017 1211   GFRNONAA 26 (L) 11/29/2018 1449   GFRAA 30 (L) 11/29/2018 1449   Lipase      Component Value Date/Time   LIPASE 27 12/11/2016 1335       Studies/Results: No results found.  Anti-infectives: Anti-infectives (From admission, onward)   Start     Dose/Rate Route Frequency Ordered Stop   11/28/18 1000  piperacillin-tazobactam (ZOSYN) IVPB 3.375 g     3.375 g 12.5 mL/hr over 240 Minutes Intravenous Every 8 hours 11/28/18 0922     11/24/18 1800  ciprofloxacin (CIPRO) IVPB 400 mg  Status:  Discontinued     400 mg 200 mL/hr over 60 Minutes Intravenous Every 24 hours 11/24/18 1516 11/28/18 0922   11/24/18 1800  metroNIDAZOLE (FLAGYL) IVPB 500 mg  Status:  Discontinued     500 mg 100 mL/hr over 60 Minutes Intravenous Every 8 hours 11/24/18 1516 11/28/18 0922   11/21/18 2100  cefTRIAXone (ROCEPHIN) 2 g in sodium chloride 0.9 % 100 mL IVPB  Status:  Discontinued     2 g 200 mL/hr over 30 Minutes Intravenous Every 24 hours 11/21/18 0421 11/24/18 1602   11/21/18 2100  azithromycin (ZITHROMAX) 500 mg in sodium chloride 0.9 % 250 mL IVPB  Status:  Discontinued     500 mg  250 mL/hr over 60 Minutes Intravenous Every 24 hours 11/21/18 0421 11/24/18 1602   11/20/18 2215  cefTRIAXone (ROCEPHIN) 1 g in sodium chloride 0.9 % 100 mL IVPB     1 g 200 mL/hr over 30 Minutes Intravenous  Once 11/20/18 2204 11/20/18 2321   11/20/18 2215  azithromycin (ZITHROMAX) 500 mg in sodium chloride 0.9 % 250 mL IVPB     500 mg 250 mL/hr over 60 Minutes Intravenous  Once 11/20/18 2204 11/20/18 2352       Assessment/Plan 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  Can convert to oral medications. -ok for DC home from our standpoint -will need outpatient IR follow up with drain study given likely fistula -follow up in our office with Dr. Kieth Brightly in 3 weeks   FEN - IVFs/heart healthy diet VTE - Eliquis ID - Cipro/Flagyl stopped and now on zosyn   LOS: 10 days     [name] , [facility] 11/30/2018, 9:20 AM Pager: [phone]

## 2018-11-30 NOTE — Consult Note (Signed)
Consultation Note Date: 11/30/2018   Patient Name: Heather Benson  DOB: [birth year]  MRN: [id number]  Age / Sex: 85 y.o., female  PCP: [name], MD Referring Physician: Mercy Riding, MD  Reason for Consultation: Establishing GOCs and emotional support  HPI/Patient Profile:   85 y.o. female  admitted on 11/20/2018 with  PMH significant for systolic CHF, PAF, CAD, HTN, HLD and vertebroplasty admitted with diverticular abscess and back pain.    Had drain placed by IR for diverticular abscess.  Abscess culture grew Enterococcus faecalis and E. coli both sensitive to IV Zosyn.  Discussed with ID, Dr. Linus Salmons who recommended amoxicillin for enterococcus and Keflex for E. Coli.. So she was transitioned to oral antibiotics on 11/30/2018.  Patient was evaluated by PT/OT who recommended SNF.  However, patient is eager to be discharged back home.  Family, particularly son concerned about patient being discharged home as her husband is not well enough to look after her.  Patient and her family face treatment option decisions, advanced directives decisions and anticipatory care needs.   Clinical Assessment and Goals of Care:   This NP Wadie Lessen reviewed medical records, received report from team, assessed the patient and then meet at the patient's bedside and then spoke by telephone   to discuss diagnosis, prognosis, GOC, EOL wishes disposition and options.  Concept of Hospice and Palliative Care were discussed  A detailed discussion was had today regarding advanced directives.  Concepts specific to code status, artifical feeding and hydration, continued IV antibiotics and rehospitalization was had.  The difference between a aggressive medical intervention path  and a palliative comfort care path for this patient at this time was had.  Values and goals of care important to patient and family were attempted to be  elicited.  Son/Jeff has good understanding of the overall situation and is realistic about the patient's human mortality and natural life cycle.  He and his other 2 brothers and the patient's husband have open conversation regarding current medical situation and treatment options and realistic outcomes.  However at this point in time they are open to all offered and available medical interventions to prolong quality of life.  They will continue as a family to have discussions regarding the next transitions of care.  They understand that there will be increased care needs in the home regardless of goals of care decisions.   Questions and concerns addressed.   Family encouraged to call with questions or concerns.    PMT will continue to support holistically.   SUMMARY OF RECOMMENDATIONS    Code Status/Advance Care Planning:  DNR/DNI- previously documented   Palliative Prophylaxis:   Bowel Regimen, Delirium Protocol, Frequent Pain Assessment and Oral Care  Additional Recommendations (Limitations, Scope, Preferences):  Full Scope Treatment  Psycho-social/Spiritual:   Desire for further Chaplaincy support:yes  Additional Recommendations: Education on Hospice and OP Palliative Care srvices  Prognosis:   Unable to determine  Discharge Planning: To Be Determined      Primary Diagnoses: Present on Admission: .  Acute respiratory failure with hypoxia ([place]) . PAF (paroxysmal atrial fibrillation) ([place]) . Essential hypertension . CAP (community acquired pneumonia) . CKD (chronic kidney disease), stage IV ([place]) . Closed compression fracture of body of L1 vertebra ([place]) . Anemia in chronic renal disease   I have reviewed the medical record, interviewed the patient and family, and examined the patient. The following aspects are pertinent.  Past Medical History:  Diagnosis Date  . Aortic atherosclerosis ([place]) 12/25/2016  . Aortic regurgitation    a. mild-mod by echo 01/2018.  [name]  Aortic stenosis, mild 12/14/2015  . Arthritis 08-28-11   right knee pain-surgery planned  . CAD (coronary artery disease)    a. by cath 2012. b. Elev troponin 01/2018 -> cath not pursued due to renal dysfunction.  . Cancer [facility]) 08-28-11   Melonoma-cheek(many Yrs ago) , recent bx. left  face lesion, past skin cancer lesions  . CAP (community acquired pneumonia) 12/13/2015  . Chronic combined systolic and diastolic heart failure (HCC)    prob ischemic CM // admx with NSTEMI in 9/19 in setting of pneumonia // Echo 9/19: EF 35-40 // LHC avoided due to CKD //   . Complication of anesthesia 08-28-11   in past arrythmia-  cardiology has seen in past  . Dilated cardiomyopathy ([place]) 03/20/2018   admx with NSTEMI in setting of prob pneumonia in 9/19 >> prob ischemic CM // Echo 9/19:  EF 35-40, anteroseptal, anterior, inferior, apical hypokinesis consistent with ischemia in the distribution of the LAD, grade 1 diastolic dysfunction, mild aortic stenosis (mean 7, peak 13), mild aortic insufficiency, moderate MAC, mild to moderate MR, moderate to severe LAE, mild RAE, moderate TR, PASP 59 //   . GERD (gastroesophageal reflux disease) 08-28-11   tx. omeprazole  . Hypercholesterolemia   . Hypertension 08-28-11   tx. meds  . IBS (irritable bowel syndrome)   . Lymphedema   . Mild mitral regurgitation   . Neuromuscular disorder ([place]) 08-28-11   hx. Polio age 70-slight residual affects legs  . Osteoarthritis    back and left knee  . Osteoporosis   . PAF (paroxysmal atrial fibrillation) ([place])   . Pneumonia 12/2015   hospital x 3 days  . PONV (postoperative nausea and vomiting)   . Renal cyst, right   . Sarcoidosis   . SVT (supraventricular tachycardia) ([place])   . Swelling of extremity 08-28-11   bilateral lower extremities- mid calf down   Social History   Socioeconomic History  . Marital status: Married    Spouse name: [name]  . Number of children: 3  . Years of education: 56  . Highest education level:  Not on file  Occupational History  . Occupation: Retired- [employer]  . Financial resource strain: Not on file  . Food insecurity    Worry: Not on file    Inability: Not on file  . Transportation needs    Medical: Not on file    Non-medical: Not on file  Tobacco Use  . Smoking status: Never Smoker  . Smokeless tobacco: Never Used  Substance and Sexual Activity  . Alcohol use: No    Alcohol/week: 0.0 standard drinks  . Drug use: No  . Sexual activity: Not Currently    Partners: Male  Lifestyle  . Physical activity    Days per week: Not on file    Minutes per session: Not on file  . Stress: Not on file  Relationships  . Social connections  Talks on phone: Not on file    Gets together: Not on file    Attends religious service: Not on file    Active member of club or organization: Not on file    Attends meetings of clubs or organizations: Not on file    Relationship status: Not on file  Other Topics Concern  . Not on file  Social History Narrative   Lives with husband, Hilbert Odor at home   Caffeine use: Drinks coffee/tea/caffeine free soda   OCCUPATION: retired    Family History  Problem Relation Age of Onset  . Heart disease Mother        Varicose Veins  . Hyperlipidemia Mother   . Hypertension Mother   . Colon cancer Mother   . Heart disease Father        Heart Disease before age 64  . Hypertension Father   . Emphysema Father   . Breast cancer Sister   . Stroke Maternal Grandmother   . Heart attack Brother    Scheduled Meds: . amiodarone  200 mg Oral Daily  . amoxicillin  500 mg Oral Q12H  . apixaban  2.5 mg Oral BID  . atorvastatin  10 mg Oral Daily  . carvedilol  3.125 mg Oral BID  . cephALEXin  500 mg Oral Q12H  . Chlorhexidine Gluconate Cloth  6 each Topical Q0600  . feeding supplement (ENSURE ENLIVE)  237 mL Oral BID BM  . furosemide  20 mg Oral Daily  . latanoprost  1 drop Both Eyes QHS  . mouth rinse  15 mL Mouth Rinse BID  .  mirtazapine  15 mg Oral QHS  . multivitamin  15 mL Oral Daily  . potassium chloride  10 mEq Oral Daily  . predniSONE  10 mg Oral Q breakfast  . sodium chloride flush  10-40 mL Intracatheter Q12H  . sodium chloride flush  5 mL Intracatheter Q8H   Continuous Infusions: . sodium chloride 100 mL (11/26/18 0208)   PRN Meds:.sodium chloride, acetaminophen **OR** acetaminophen, HYDROcodone-acetaminophen, loperamide, nitroGLYCERIN, ondansetron **OR** ondansetron (ZOFRAN) IV, polyethylene glycol, sodium chloride flush Medications Prior to Admission:  Prior to Admission medications   Medication Sig Start Date End Date Taking? Authorizing Provider  acetaminophen (TYLENOL) 500 MG tablet Take 1,000 mg by mouth every 8 (eight) hours as needed (for pain).    Yes [provider]  amiodarone (PACERONE) 200 MG tablet TAKE 1 TABLET BY MOUTH EVERY DAY Patient taking differently: Take 200 mg by mouth daily.  09/21/18  Yes Jettie Booze, MD  apixaban (ELIQUIS) 2.5 MG TABS tablet Take 1 tablet (2.5 mg total) by mouth 2 (two) times daily. PLEASE START ON 02/03/18 AT 9PM 02/03/18  Yes Bonnielee Haff, MD  atorvastatin (LIPITOR) 10 MG tablet Take 1 tablet (10 mg total) by mouth daily. 09/08/17  Yes Jettie Booze, MD  carvedilol (COREG) 3.125 MG tablet Take 1 tablet (3.125 mg total) by mouth 2 (two) times daily. 06/30/18 11/20/18 Yes Dunn, Dayna N, PA-C  furosemide (LASIX) 20 MG tablet Take 1 tablet (20 mg total) by mouth daily. 02/03/18  Yes Bonnielee Haff, MD  HYDROcodone-acetaminophen (NORCO/VICODIN) 5-325 MG tablet Take 1 tablet by mouth 2 (two) times daily as needed for pain.   Yes [provider]  latanoprost (XALATAN) 0.005 % ophthalmic solution Place 1 drop into both eyes at bedtime. 08/11/14  Yes [provider]  mirtazapine (REMERON) 15 MG tablet Take 15 mg by mouth at bedtime.   Yes [provider]  nitroGLYCERIN (NITROSTAT) 0.4 MG SL tablet Place 1 tablet (0.4 mg  total) under the tongue every 5 (five) minutes as needed for chest pain. 03/20/18 11/20/18 Yes Weaver, Scott T, PA-C  polyethylene glycol (MIRALAX / GLYCOLAX) packet Take 17 g by mouth daily as needed. 02/03/18  Yes Bonnielee Haff, MD  potassium chloride (K-DUR) 10 MEQ tablet Take 10 mEq by mouth daily.   Yes [provider]  predniSONE (DELTASONE) 10 MG tablet Take 10 mg by mouth daily with breakfast.   Yes [provider]  Respiratory Therapy Supplies (FLUTTER) DEVI Use as directed. 02/06/16   Marshell Garfinkel, MD   Allergies  Allergen Reactions  . Augmentin [Amoxicillin-Pot Clavulanate] Diarrhea and Nausea Only  . Boniva [Ibandronic Acid] Diarrhea  . Codeine Other (See Comments)    "Spaced out feeling" and Lightheadedness  . Hydromorphone Nausea And Vomiting  . Latex Rash    "Burns me"  . Lisinopril Cough  . Amoxicillin   . Clavulanic Acid   . Adhesive [Tape] Itching and Rash   Review of Systems  Musculoskeletal: Positive for back pain.  Neurological: Positive for weakness.    Physical Exam  Vital Signs: BP (!) 116/58 (BP Location: Left Arm)   Pulse 62   Temp 98.5 F (36.9 C) (Oral)   Resp 16   Ht 5' 6.5" (1.689 m)   Wt 68 kg   SpO2 97%   BMI 23.85 kg/m  Pain Scale: 0-10 POSS *See Group Information*: 1-Acceptable,Awake and alert Pain Score: 9    SpO2: SpO2: 97 % O2 Device:SpO2: 97 % O2 Flow Rate: .O2 Flow Rate (L/min): 3 L/min  IO: Intake/output summary:   Intake/Output Summary (Last 24 hours) at 11/30/2018 1524 Last data filed at 11/30/2018 1300 Gross per 24 hour  Intake 596.46 ml  Output 670 ml  Net -73.54 ml    LBM: Last BM Date: 11/29/18 Baseline Weight: Weight: 64.4 kg Most recent weight: Weight: 68 kg     Palliative Assessment/Data: 50%   Discussed with Dr Cyndia Skeeters  Time In: 1450 Time Out: 1600 Time Total: 70 minutes Greater than 50%  of this time was spent counseling and coordinating care related to the above assessment and plan.   Signed by: Wadie Lessen, NP   Please contact Palliative Medicine Team phone at [phone] for questions and concerns.  For individual provider: See Shea Evans

## 2018-12-01 DIAGNOSIS — Z515 Encounter for palliative care: Secondary | ICD-10-CM

## 2018-12-01 DIAGNOSIS — S22080K Wedge compression fracture of T11-T12 vertebra, subsequent encounter for fracture with nonunion: Secondary | ICD-10-CM

## 2018-12-01 DIAGNOSIS — K5720 Diverticulitis of large intestine with perforation and abscess without bleeding: Principal | ICD-10-CM

## 2018-12-01 DIAGNOSIS — Z66 Do not resuscitate: Secondary | ICD-10-CM

## 2018-12-01 DIAGNOSIS — S22080A Wedge compression fracture of T11-T12 vertebra, initial encounter for closed fracture: Secondary | ICD-10-CM

## 2018-12-01 DIAGNOSIS — R531 Weakness: Secondary | ICD-10-CM

## 2018-12-01 LAB — MAGNESIUM: Magnesium: 2.2 mg/dL (ref 1.7–2.4)

## 2018-12-01 LAB — CBC
HCT: 41.7 % (ref 36.0–46.0)
Hemoglobin: 12.4 g/dL (ref 12.0–15.0)
MCH: 28.1 pg (ref 26.0–34.0)
MCHC: 29.7 g/dL — ABNORMAL LOW (ref 30.0–36.0)
MCV: 94.3 fL (ref 80.0–100.0)
Platelets: 230 10*3/uL (ref 150–400)
RBC: 4.42 MIL/uL (ref 3.87–5.11)
RDW: 17 % — ABNORMAL HIGH (ref 11.5–15.5)
WBC: 12.6 10*3/uL — ABNORMAL HIGH (ref 4.0–10.5)
nRBC: 0 % (ref 0.0–0.2)

## 2018-12-01 LAB — AEROBIC/ANAEROBIC CULTURE W GRAM STAIN (SURGICAL/DEEP WOUND)

## 2018-12-01 LAB — BASIC METABOLIC PANEL
Anion gap: 8 (ref 5–15)
BUN: 20 mg/dL (ref 8–23)
CO2: 31 mmol/L (ref 22–32)
Calcium: 8.8 mg/dL — ABNORMAL LOW (ref 8.9–10.3)
Chloride: 102 mmol/L (ref 98–111)
Creatinine, Ser: 1.63 mg/dL — ABNORMAL HIGH (ref 0.44–1.00)
GFR calc Af Amer: 33 mL/min — ABNORMAL LOW (ref >60)
GFR calc non Af Amer: 28 mL/min — ABNORMAL LOW (ref >60)
Glucose, Bld: 108 mg/dL — ABNORMAL HIGH (ref 70–99)
Potassium: 4.7 mmol/L (ref 3.5–5.1)
Sodium: 141 mmol/L (ref 135–145)

## 2018-12-01 MED ORDER — PRO-STAT SUGAR FREE PO LIQD
30.0000 mL | Freq: Two times a day (BID) | ORAL | Status: DC
  Administered 2018-12-01 – 2018-12-03 (×5): 30 mL via ORAL
  Filled 2018-12-01 (×5): qty 30

## 2018-12-01 MED ORDER — ENSURE ENLIVE PO LIQD
237.0000 mL | Freq: Three times a day (TID) | ORAL | Status: DC
  Administered 2018-12-02 – 2018-12-03 (×5): 237 mL via ORAL

## 2018-12-01 NOTE — Progress Notes (Signed)
PT Cancellation Note  Patient Details Name: [name] MRN: [id number] DOB: 1931/10/03   Cancelled Treatment:    Reason Eval/Treat Not Completed: Other (comment). Pt initially asked PT to come back after lunch which was not on the hall yet, then upon returning from her meal pt declined stating she just did not feel up to therapy today.  Will reattempt tomorrow.   Ramond Dial 12/01/2018, 3:12 PM   Mee Hives, PT MS Acute Rehab Dept. Number: [place] and [place]

## 2018-12-01 NOTE — Progress Notes (Signed)
Spoke with pt's husband [name]. Told him that pt refused PT. He stated that she seemed a little more confused today. Nurse agreed. He also stated that he and his son both want her to go to SNF for rehab. Told husband that this nurse would relay the message to MD and CSW.

## 2018-12-01 NOTE — Progress Notes (Signed)
Called pt's husband, no answer. Will try again later.

## 2018-12-01 NOTE — Progress Notes (Signed)
PROGRESS NOTE  ZAKARA PARKEY MWN:[id number] DOB: [birth year]   PCP: [name], MD  Patient is from: Home  DOA: 11/20/2018 LOS: 7  Brief Narrative / Interim history: 85 year old female with history of chronic systolic CHF, PAF, CAD, HTN, HLD and vertebroplasty admitted with diverticular abscess and back pain.  Had drain placed by IR for diverticular abscess.  Abscess culture grew Enterococcus faecalis and E. coli both sensitive to IV Zosyn.  Discussed with ID, Dr. Linus Salmons who recommended amoxicillin for enterococcus and Keflex for E. Coli.. So she was transitioned to oral antibiotics on 11/30/2018.  Patient was evaluated by PT/OT who recommended SNF.  However, she is eager to be discharged back home.  Family, particularly son concerned about patient being discharged home as her husband is not well enough to look after her. She is now considering SNF close to her home.  Subjective: No major events overnight of this morning.  No complaints morning.  She denies abdominal pain.  Diarrhea subsided.  No chest pain or dyspnea.  She lives to be home but understand the burden this puts on her family.  She is considering SNF close to her home.  She states she has been to [facility].  She refuses CIR.  Objective: Vitals:   11/30/18 2059 12/01/18 0517 12/01/18 0615 12/01/18 1205  BP: (!) 151/63 (!) 150/80  127/66  Pulse: (!) 59 (!) 57  60  Resp: 20 18  16   Temp: 98.8 F (37.1 C) 97.8 F (36.6 C)  97.9 F (36.6 C)  TempSrc: Oral Oral    SpO2: 100% (!) 87%  100%  Weight:   64.9 kg   Height:        Intake/Output Summary (Last 24 hours) at 12/01/2018 1222 Last data filed at 12/01/2018 0558 Gross per 24 hour  Intake 582 ml  Output 264 ml  Net 318 ml   Filed Weights   11/27/18 0354 11/30/18 0606 12/01/18 0615  Weight: 68 kg 68 kg 64.9 kg    Examination:  GENERAL: No acute distress.  Lying in bed comfortably. HEENT: MMM.  Vision and hearing grossly intact.  NECK: Supple.  No apparent  JVD.  RESP:  No IWOB. Good air movement bilaterally. CVS: Seems to have PACs/trigeminy on auscultation.  Heart sounds normal.  No murmurs. ABD/GI/GU: Bowel sounds present. Soft.  Tenderness over LLQ.  Drain over LUQ with very minimal serosanguineous fluid. MSK/EXT:  Moves extremities. No apparent deformity or edema.  SKIN: no apparent skin lesion or wound NEURO: Awake, alert and oriented appropriately.  No gross deficit.  PSYCH: Calm. Normal affect.   I have personally reviewed the following labs and images:  Radiology Studies: No results found.  Microbiology: Recent Results (from the past 240 hour(s))  Urine culture     Status: Abnormal   Collection Time: 11/21/18  2:17 PM   Specimen: Urine, Clean Catch  Result Value Ref Range Status   Specimen Description URINE, CLEAN CATCH  Final   Special Requests NONE  Final   Culture (A)  Final    10,000 COLONIES/mL ESCHERICHIA COLI 80,000 COLONIES/mL AEROCOCCUS SPECIES Standardized susceptibility testing for this organism is not available. Performed at [facility], [place] [address], [place], [place] [address]    Report Status 11/24/2018 FINAL  Final   Organism ID, Bacteria ESCHERICHIA COLI (A)  Final      Susceptibility   Escherichia coli - MIC*    AMPICILLIN >=32 RESISTANT Resistant     CEFAZOLIN <=4 SENSITIVE Sensitive  CEFTRIAXONE <=1 SENSITIVE Sensitive     CIPROFLOXACIN >=4 RESISTANT Resistant     GENTAMICIN <=1 SENSITIVE Sensitive     IMIPENEM <=0.25 SENSITIVE Sensitive     NITROFURANTOIN <=16 SENSITIVE Sensitive     TRIMETH/SULFA <=20 SENSITIVE Sensitive     AMPICILLIN/SULBACTAM >=32 RESISTANT Resistant     PIP/TAZO 8 SENSITIVE Sensitive     Extended ESBL NEGATIVE Sensitive     * 10,000 COLONIES/mL ESCHERICHIA COLI  Surgical pcr screen     Status: Abnormal   Collection Time: 11/25/18 10:58 PM   Specimen: Nasal Mucosa; Nasal Swab  Result Value Ref Range Status   MRSA, PCR POSITIVE (A) NEGATIVE Final    Staphylococcus aureus POSITIVE (A) NEGATIVE Final    Comment: RESULT CALLED TO, READ BACK BY AND VERIFIED WITH: C ROBINSON RN 11/26/18 0125 JDW (NOTE) The Xpert SA Assay (FDA approved for NASAL specimens in patients 51 years of age and older), is one component of a comprehensive surveillance program. It is not intended to diagnose infection nor to guide or monitor treatment. Performed at [facility], [place] [address] [place], [place] [address]   Aerobic/Anaerobic Culture (surgical/deep wound)     Status: None   Collection Time: 11/26/18  1:48 PM   Specimen: Abscess  Result Value Ref Range Status   Specimen Description ABSCESS PERICOLONIC  Final   Special Requests NONE  Final   Gram Stain   Final    FEW WBC PRESENT, PREDOMINANTLY PMN FEW GRAM POSITIVE RODS FEW GRAM POSITIVE COCCI IN CLUSTERS    Culture   Final    MODERATE ENTEROCOCCUS FAECALIS FEW ESCHERICHIA COLI FEW BACTEROIDES FRAGILIS FEW BACTEROIDES OVATUS BETA LACTAMASE POSITIVE Performed at [facility], [place] [address] [place], [place] [address]    Report Status 12/01/2018 FINAL  Final   Organism ID, Bacteria ENTEROCOCCUS FAECALIS  Final   Organism ID, Bacteria ESCHERICHIA COLI  Final      Susceptibility   Escherichia coli - MIC*    AMPICILLIN >=32 RESISTANT Resistant     CEFAZOLIN <=4 SENSITIVE Sensitive     CEFEPIME <=1 SENSITIVE Sensitive     CEFTAZIDIME <=1 SENSITIVE Sensitive     CEFTRIAXONE <=1 SENSITIVE Sensitive     CIPROFLOXACIN >=4 RESISTANT Resistant     GENTAMICIN <=1 SENSITIVE Sensitive     IMIPENEM <=0.25 SENSITIVE Sensitive     TRIMETH/SULFA <=20 SENSITIVE Sensitive     AMPICILLIN/SULBACTAM >=32 RESISTANT Resistant     PIP/TAZO <=4 SENSITIVE Sensitive     Extended ESBL NEGATIVE Sensitive     * FEW ESCHERICHIA COLI   Enterococcus faecalis - MIC*    AMPICILLIN <=2 SENSITIVE Sensitive     VANCOMYCIN 1 SENSITIVE Sensitive     GENTAMICIN SYNERGY SENSITIVE Sensitive     * MODERATE  ENTEROCOCCUS FAECALIS    Sepsis Labs: Invalid input(s): PROCALCITONIN, LACTICIDVEN  Urine analysis:    Component Value Date/Time   COLORURINE YELLOW 11/20/2018 1421   APPEARANCEUR CLOUDY (A) 11/20/2018 1421   APPEARANCEUR Clear 07/17/2017 1354   LABSPEC 1.017 11/20/2018 1421   PHURINE 5.0 11/20/2018 1421   GLUCOSEU NEGATIVE 11/20/2018 1421   HGBUR LARGE (A) 11/20/2018 1421   BILIRUBINUR NEGATIVE 11/20/2018 1421   BILIRUBINUR Negative 07/17/2017 1354   KETONESUR 5 (A) 11/20/2018 1421   PROTEINUR NEGATIVE 11/20/2018 1421   UROBILINOGEN 0.2 02/05/2011 0915   NITRITE POSITIVE (A) 11/20/2018 1421   LEUKOCYTESUR LARGE (A) 11/20/2018 1421    Anemia Panel: No results for input(s): VITAMINB12,  FOLATE, FERRITIN, TIBC, IRON, RETICCTPCT in the last 72 hours.  Thyroid Function Tests: No results for input(s): TSH, T4TOTAL, FREET4, T3FREE, THYROIDAB in the last 72 hours.  Lipid Profile: No results for input(s): CHOL, HDL, LDLCALC, TRIG, CHOLHDL, LDLDIRECT in the last 72 hours.  CBG: No results for input(s): GLUCAP in the last 168 hours.  HbA1C: No results for input(s): HGBA1C in the last 72 hours.  BNP (last 3 results): No results for input(s): PROBNP in the last 8760 hours.  Cardiac Enzymes: No results for input(s): CKTOTAL, CKMB, CKMBINDEX, TROPONINI in the last 168 hours.  Coagulation Profile: Recent Labs  Lab 11/25/18 1203  INR 1.1    Liver Function Tests: Recent Labs  Lab 11/27/18 0418 11/29/18 1449  ALBUMIN 2.0* 2.1*   No results for input(s): LIPASE, AMYLASE in the last 168 hours. No results for input(s): AMMONIA in the last 168 hours.  Basic Metabolic Panel: Recent Labs  Lab 11/25/18 0236 11/26/18 0358 11/27/18 0418 11/29/18 1449 12/01/18 0424  NA 136 142 140 139 141  K 4.9 4.5 4.6 5.2* 4.7  CL 100 104 104 101 102  CO2 25 29 29 30 31   GLUCOSE 80 95 157* 117* 108*  BUN 21 16 16 15 20   CREATININE 1.53* 1.48* 1.46* 1.74* 1.63*  CALCIUM 8.8* 8.7*  8.5* 9.0 8.8*  MG  --   --  1.9 2.0 2.2  PHOS  --   --  3.2 3.1  --    GFR: Estimated Creatinine Clearance: 24.1 mL/min (A) (by C-G formula based on SCr of 1.63 mg/dL (H)).  CBC: Recent Labs  Lab 11/25/18 0236  11/26/18 0358  11/27/18 0418  11/29/18 1449 11/29/18 1821 11/30/18 0254 11/30/18 1156 12/01/18 0523  WBC 11.9*  --  10.2  --  11.7*  --  12.6*  --   --   --  12.6*  NEUTROABS 8.4*  --  7.2  --   --   --  10.5*  --   --   --   --   HGB 13.8   < > 12.1  12.1   < > 12.2   < > 12.7 13.6 12.1 12.3 12.4  HCT 45.6   < > 40.6  41.1   < > 41.6   < > 43.2 45.4 40.4 41.7 41.7  MCV 92.9  --  93.5  --  94.8  --  94.1  --   --   --  94.3  PLT 229  --  235  --  231  --  237  --   --   --  230   < > = values in this interval not displayed.    Procedures:  Drain placement by IR  Microbiology summarized: [id number] negative. Abscess drain culture grew enterococcus faecalis and E. coli Blood cultures negative so far  Assessment & Plan: Diverticulitis with diverticular abscess: -Status post percutaneous drain by IR -Abscess culture grew Enterococcus faecalis and E. coli both sensitive to Zosyn but no common oral agent -Transitioned to Augmentin and Keflex per ID recommendation.  Antibiotic course depends on duration of drain. -Okay to discharge with drain in place per surgery.  Follow-up with Dr. Alvino Blood in 3 weeks. -IR recommends flushing drain daily with 10 cc sterile saline and outpatient follow-up in 7 to 10 days. -IR to arrange outpatient follow-up. -PT/OT recommended SNF.  Patient now considering SNF close to her home -Palliative care consulted  Leukocytosis: Likely due to the above.  Improving.  Back pain likely due to compression fraction at T12/L5. -IR suggests vertebroplasty when diverticular abscess is controlled -Pain fairly controlled.  Paroxysmal A. Fib: currently sinus rhythm with PAC. -Continue amiodarone, Coreg and Eliquis  Chronic diastolic CHF: Stable  -Continue home Lasix.  AKI on CKD 3: AKI resolved -Continue monitoring  Anemia of chronic disease: Hgb stable. -Continue monitoring  Acute respiratory failure with hypoxia: Resolved  DVT prophylaxis: On Eliquis for atrial fibrillation Code Status: DNR/DNI Family Communication: Updated patient's son on 7/20. Disposition Plan: Remains inpatient.  Patient to discuss and decide final disposition.  Consultants: General surgery, IR, palliative care, ID (over the phone)   Antimicrobials: Anti-infectives (From admission, onward)   Start     Dose/Rate Route Frequency Ordered Stop   11/30/18 1700  amoxicillin-clavulanate (AUGMENTIN) 500-125 MG per tablet 500 mg     1 tablet Oral 2 times daily with meals 11/30/18 1553 12/14/18 1659   11/30/18 1200  amoxicillin (AMOXIL) capsule 500 mg  Status:  Discontinued     500 mg Oral Every 12 hours 11/30/18 1057 11/30/18 1553   11/30/18 1200  cephALEXin (KEFLEX) capsule 500 mg     500 mg Oral Every 12 hours 11/30/18 1057 12/14/18 0959   11/28/18 1000  piperacillin-tazobactam (ZOSYN) IVPB 3.375 g  Status:  Discontinued     3.375 g 12.5 mL/hr over 240 Minutes Intravenous Every 8 hours 11/28/18 0922 11/30/18 1057   11/24/18 1800  ciprofloxacin (CIPRO) IVPB 400 mg  Status:  Discontinued     400 mg 200 mL/hr over 60 Minutes Intravenous Every 24 hours 11/24/18 1516 11/28/18 0922   11/24/18 1800  metroNIDAZOLE (FLAGYL) IVPB 500 mg  Status:  Discontinued     500 mg 100 mL/hr over 60 Minutes Intravenous Every 8 hours 11/24/18 1516 11/28/18 0922   11/21/18 2100  cefTRIAXone (ROCEPHIN) 2 g in sodium chloride 0.9 % 100 mL IVPB  Status:  Discontinued     2 g 200 mL/hr over 30 Minutes Intravenous Every 24 hours 11/21/18 0421 11/24/18 1602   11/21/18 2100  azithromycin (ZITHROMAX) 500 mg in sodium chloride 0.9 % 250 mL IVPB  Status:  Discontinued     500 mg 250 mL/hr over 60 Minutes Intravenous Every 24 hours 11/21/18 0421 11/24/18 1602   11/20/18 2215   cefTRIAXone (ROCEPHIN) 1 g in sodium chloride 0.9 % 100 mL IVPB     1 g 200 mL/hr over 30 Minutes Intravenous  Once 11/20/18 2204 11/20/18 2321   11/20/18 2215  azithromycin (ZITHROMAX) 500 mg in sodium chloride 0.9 % 250 mL IVPB     500 mg 250 mL/hr over 60 Minutes Intravenous  Once 11/20/18 2204 11/20/18 2352      Sch Meds:  Scheduled Meds: . amiodarone  200 mg Oral Daily  . amoxicillin-clavulanate  1 tablet Oral BID WC  . apixaban  2.5 mg Oral BID  . atorvastatin  10 mg Oral Daily  . carvedilol  3.125 mg Oral BID  . cephALEXin  500 mg Oral Q12H  . feeding supplement (ENSURE ENLIVE)  237 mL Oral BID BM  . furosemide  20 mg Oral Daily  . latanoprost  1 drop Both Eyes QHS  . mouth rinse  15 mL Mouth Rinse BID  . mirtazapine  15 mg Oral QHS  . multivitamin  15 mL Oral Daily  . potassium chloride  10 mEq Oral Daily  . predniSONE  10 mg Oral Q breakfast  . sodium chloride flush  10-40 mL Intracatheter Q12H  .  sodium chloride flush  5 mL Intracatheter Q8H   Continuous Infusions: . sodium chloride Stopped (11/30/18 2301)   PRN Meds:.sodium chloride, acetaminophen **OR** acetaminophen, HYDROcodone-acetaminophen, loperamide, nitroGLYCERIN, ondansetron **OR** ondansetron (ZOFRAN) IV, polyethylene glycol, sodium chloride flush  Shyanne Mcclary T. Hillman  If 7PM-7AM, please contact night-coverage [email or web address] Password [facility] 12/01/2018, 12:22 PM

## 2018-12-01 NOTE — Progress Notes (Signed)
Referring Physician(s): Dr Jonny Ruiz  Supervising Physician: Jacqulynn Cadet  Patient Status:  [facility] - In-pt  Chief Complaint:  Diverticular abscess Drain placed 7/16 in IR  Subjective:  Drain is intact Clean and dry OP is 15-20 cc daily Feels better daily Says back pain is minimal   Allergies: Augmentin [amoxicillin-pot clavulanate], Boniva [ibandronic acid], Codeine, Hydromorphone, Latex, Lisinopril, Amoxicillin, Clavulanic acid, and Adhesive [tape]  Medications: Prior to Admission medications   Medication Sig Start Date End Date Taking? Authorizing Provider  acetaminophen (TYLENOL) 500 MG tablet Take 1,000 mg by mouth every 8 (eight) hours as needed (for pain).    Yes [provider]  amiodarone (PACERONE) 200 MG tablet TAKE 1 TABLET BY MOUTH EVERY DAY Patient taking differently: Take 200 mg by mouth daily.  09/21/18  Yes Jettie Booze, MD  apixaban (ELIQUIS) 2.5 MG TABS tablet Take 1 tablet (2.5 mg total) by mouth 2 (two) times daily. PLEASE START ON 02/03/18 AT 9PM 02/03/18  Yes Bonnielee Haff, MD  atorvastatin (LIPITOR) 10 MG tablet Take 1 tablet (10 mg total) by mouth daily. 09/08/17  Yes Jettie Booze, MD  carvedilol (COREG) 3.125 MG tablet Take 1 tablet (3.125 mg total) by mouth 2 (two) times daily. 06/30/18 11/20/18 Yes Dunn, Dayna N, PA-C  furosemide (LASIX) 20 MG tablet Take 1 tablet (20 mg total) by mouth daily. 02/03/18  Yes Bonnielee Haff, MD  HYDROcodone-acetaminophen (NORCO/VICODIN) 5-325 MG tablet Take 1 tablet by mouth 2 (two) times daily as needed for pain.   Yes [provider]  latanoprost (XALATAN) 0.005 % ophthalmic solution Place 1 drop into both eyes at bedtime. 08/11/14  Yes [provider]  mirtazapine (REMERON) 15 MG tablet Take 15 mg by mouth at bedtime.   Yes [provider]  nitroGLYCERIN (NITROSTAT) 0.4 MG SL tablet Place 1 tablet (0.4 mg total) under the tongue every 5 (five) minutes as needed for  chest pain. 03/20/18 11/20/18 Yes Weaver, Scott T, PA-C  polyethylene glycol (MIRALAX / GLYCOLAX) packet Take 17 g by mouth daily as needed. 02/03/18  Yes Bonnielee Haff, MD  potassium chloride (K-DUR) 10 MEQ tablet Take 10 mEq by mouth daily.   Yes [provider]  predniSONE (DELTASONE) 10 MG tablet Take 10 mg by mouth daily with breakfast.   Yes [provider]  Respiratory Therapy Supplies (FLUTTER) DEVI Use as directed. 02/06/16   Mannam, Hart Robinsons, MD     Vital Signs: BP (!) 150/80   Pulse (!) 57   Temp 97.8 F (36.6 C) (Oral)   Resp 18   Ht 5' 6.5" (1.689 m)   Wt 143 lb (64.9 kg)   SpO2 (!) 87%   BMI 22.74 kg/m   Physical Exam Vitals signs reviewed.  Skin:    General: Skin is warm and dry.     Comments: Site of drain is clean and dry NT no bleeding OP brown/thin fluid 15-20 cc daily  ENTEROCOCCUS FAECALIS   Organism ID, Bacteria ESCHERICHIA COLI     Neurological:     Mental Status: She is oriented to person, place, and time.  Psychiatric:        Behavior: Behavior normal.     Imaging: No results found.  Labs:  CBC: Recent Labs    11/26/18 0358  11/27/18 0418  11/29/18 1449 11/29/18 1821 11/30/18 0254 11/30/18 1156 12/01/18 0523  WBC 10.2  --  11.7*  --  12.6*  --   --   --  12.6*  HGB 12.1  12.1   < > 12.2   < > 12.7 13.6 12.1 12.3 12.4  HCT 40.6  41.1   < > 41.6   < > 43.2 45.4 40.4 41.7 41.7  PLT 235  --  231  --  237  --   --   --  230   < > = values in this interval not displayed.    COAGS: Recent Labs    01/26/18 1016  11/22/18 1310 11/22/18 2219 11/23/18 0337 11/24/18 0225 11/25/18 1203  INR 1.07  --   --   --   --   --  1.1  APTT 28   < > 59* 100* 71* 98*  --    < > = values in this interval not displayed.    BMP: Recent Labs    11/26/18 0358 11/27/18 0418 11/29/18 1449 12/01/18 0424  NA 142 140 139 141  K 4.5 4.6 5.2* 4.7  CL 104 104 101 102  CO2 29 29 30 31   GLUCOSE 95 157* 117* 108*  BUN 16 16 15  20   CALCIUM 8.7* 8.5* 9.0 8.8*  CREATININE 1.48* 1.46* 1.74* 1.63*  GFRNONAA 32* 32* 26* 28*  GFRAA 37* 38* 30* 33*    LIVER FUNCTION TESTS: Recent Labs    01/29/18 0458  09/24/18 1102 11/20/18 2216 11/21/18 0517 11/27/18 0418 11/29/18 1449  BILITOT 0.8  --  0.8 1.2 1.2  --   --   AST 28  --  38* 25 24  --   --   ALT 30  --  33 18 15  --   --   ALKPHOS 103  --  322* 101 103  --   --   PROT 4.6*  --  5.7* 5.8* 5.3*  --   --   ALBUMIN 2.1*   < > 3.1* 2.2* 2.1* 2.0* 2.1*   < > = values in this interval not displayed.    Assessment and Plan:  divertic drain in place OP 15-20 cc daily Feeling better Will follow with CCS Will need to flush drain daily 10 cc sterile saline RN to teach flushing-- or MD to arrange [facility] Will need Rx for flushes Follow up with IR OP Clinic 7-10 days Orders in place-- she will hear from scheduler  Electronically Signed: Lavonia Drafts, PA-C 12/01/2018, 10:34 AM   I spent a total of 15 Minutes at the the patient's bedside AND on the patient's hospital floor or unit, greater than 50% of which was counseling/coordinating care for diverticular abscess drain

## 2018-12-01 NOTE — Progress Notes (Signed)
Patient ID: Heather Benson, female   DOB: 06/03/1931, 85 y.o.   MRN: [id number]       Subjective: No new complaints today.  Not eating much at all.  Wants to get out of bed  Objective: Vital signs in last 24 hours: Temp:  [97.8 F (36.6 C)-98.8 F (37.1 C)] 97.8 F (36.6 C) (07/21 0517) Pulse Rate:  [57-62] 57 (07/21 0517) Resp:  [16-20] 18 (07/21 0517) BP: (116-151)/(58-80) 150/80 (07/21 0517) SpO2:  [87 %-100 %] 87 % (07/21 0517) Weight:  [64.9 kg] 64.9 kg (07/21 0615) Last BM Date: 11/30/18  Intake/Output from previous day: 07/20 0701 - 07/21 0700 In: 818 [P.O.:758; I.V.:50] Out: 464 [Urine:450; Drains:14] Intake/Output this shift: No intake/output data recorded.  PE: Abd: soft, seems slightly more tender today on exam, but still with good BS and ND.  JP drain with essentially no output currently.  Appears to have some feculent remnant left in drain, but difficult to tell today.  Lab Results:  Recent Labs    11/29/18 1449  11/30/18 1156 12/01/18 0523  WBC 12.6*  --   --  12.6*  HGB 12.7   < > 12.3 12.4  HCT 43.2   < > 41.7 41.7  PLT 237  --   --  230   < > = values in this interval not displayed.   BMET Recent Labs    11/29/18 1449 12/01/18 0424  NA 139 141  K 5.2* 4.7  CL 101 102  CO2 30 31  GLUCOSE 117* 108*  BUN 15 20  CREATININE 1.74* 1.63*  CALCIUM 9.0 8.8*   PT/INR No results for input(s): LABPROT, INR in the last 72 hours. CMP     Component Value Date/Time   NA 141 12/01/2018 0424   NA 144 11/20/2017 1211   K 4.7 12/01/2018 0424   CL 102 12/01/2018 0424   CO2 31 12/01/2018 0424   GLUCOSE 108 (H) 12/01/2018 0424   BUN 20 12/01/2018 0424   BUN 31 (H) 11/20/2017 1211   CREATININE 1.63 (H) 12/01/2018 0424   CREATININE 2.03 (H) 02/27/2015 1051   CALCIUM 8.8 (L) 12/01/2018 0424   PROT 5.3 (L) 11/21/2018 0517   PROT 7.0 11/20/2017 1211   ALBUMIN 2.1 (L) 11/29/2018 1449   ALBUMIN 3.8 11/20/2017 1211   AST 24 11/21/2018 0517   ALT 15  11/21/2018 0517   ALKPHOS 103 11/21/2018 0517   BILITOT 1.2 11/21/2018 0517   BILITOT 1.0 11/20/2017 1211   GFRNONAA 28 (L) 12/01/2018 0424   GFRAA 33 (L) 12/01/2018 0424   Lipase     Component Value Date/Time   LIPASE 27 12/11/2016 1335       Studies/Results: No results found.  Anti-infectives: Anti-infectives (From admission, onward)   Start     Dose/Rate Route Frequency Ordered Stop   11/30/18 1700  amoxicillin-clavulanate (AUGMENTIN) 500-125 MG per tablet 500 mg     1 tablet Oral 2 times daily with meals 11/30/18 1553 12/14/18 1659   11/30/18 1200  amoxicillin (AMOXIL) capsule 500 mg  Status:  Discontinued     500 mg Oral Every 12 hours 11/30/18 1057 11/30/18 1553   11/30/18 1200  cephALEXin (KEFLEX) capsule 500 mg     500 mg Oral Every 12 hours 11/30/18 1057 12/14/18 0959   11/28/18 1000  piperacillin-tazobactam (ZOSYN) IVPB 3.375 g  Status:  Discontinued     3.375 g 12.5 mL/hr over 240 Minutes Intravenous Every 8 hours 11/28/18 0922 11/30/18 1057  11/24/18 1800  ciprofloxacin (CIPRO) IVPB 400 mg  Status:  Discontinued     400 mg 200 mL/hr over 60 Minutes Intravenous Every 24 hours 11/24/18 1516 11/28/18 0922   11/24/18 1800  metroNIDAZOLE (FLAGYL) IVPB 500 mg  Status:  Discontinued     500 mg 100 mL/hr over 60 Minutes Intravenous Every 8 hours 11/24/18 1516 11/28/18 0922   11/21/18 2100  cefTRIAXone (ROCEPHIN) 2 g in sodium chloride 0.9 % 100 mL IVPB  Status:  Discontinued     2 g 200 mL/hr over 30 Minutes Intravenous Every 24 hours 11/21/18 0421 11/24/18 1602   11/21/18 2100  azithromycin (ZITHROMAX) 500 mg in sodium chloride 0.9 % 250 mL IVPB  Status:  Discontinued     500 mg 250 mL/hr over 60 Minutes Intravenous Every 24 hours 11/21/18 0421 11/24/18 1602   11/20/18 2215  cefTRIAXone (ROCEPHIN) 1 g in sodium chloride 0.9 % 100 mL IVPB     1 g 200 mL/hr over 30 Minutes Intravenous  Once 11/20/18 2204 11/20/18 2321   11/20/18 2215  azithromycin (ZITHROMAX) 500 mg  in sodium chloride 0.9 % 250 mL IVPB     500 mg 250 mL/hr over 60 Minutes Intravenous  Once 11/20/18 2204 11/20/18 2352       Assessment/Plan Diverticular abscess with suspected fistula (will need fistulagram to confirm) -s/p perc drain placement by IR last week -on solid diet -would treat with approximately 10 days of abx therapy.  on oral abx therapy now -ok for DC home from our standpoint -will need outpatient IR follow up with drain study given likely fistula -follow up in our office with Dr. Kieth Brightly in 3 weeks   FEN -heart healthy diet VTE -Eliquis ID -augmentin   LOS: 11 days    [name] , [facility] 12/01/2018, 9:16 AM Pager: [phone]

## 2018-12-01 NOTE — Progress Notes (Signed)
Patient ID: Heather Benson, female   DOB: [birth year], 85 y.o.   MRN: [id number]  This NP visited patient at the bedside as a follow up to  yesterday's [place].  Spoke to son Heather Benson by telephone for continued conversation regarding current medical situation; treatment options, goals of care, disposition issues and options.  Patient verbalizes this morning and agreement for short-term rehab.  Family are hopeful and optimistic that patient could transition to inpatient CIR rehabilitation opportunity.  Discussed with attending.  Patient tends to waffle back and forth between "wanting to try" and simply wanting to disposition home and let nature take its course.  Patient's son and I discussed in great detail the concept of human mortality and the limitations of medical interventions to prolong  quality of when a body begins to fail to thrive. The family understands that simply secondary to the patient's advanced age that long-term prognosis is limited  Comfort, quality and dignity of life are priority to all  Discussed with son the importance of continued conversation with all family memebers and the  medical providers regarding overall plan of care and treatment options,  ensuring decisions are within the context of the patients values and GOCs.  Questions and concerns addressed   Discussed with Dr Cyndia Skeeters and Whitman Hero  Total time spent on the unit was 35 minutes    PMT will continue to support holistically  Greater than 50% of the time was spent in counseling and coordination of care  Wadie Lessen NP  Palliative Medicine Team Team Phone # [phone] Pager [phone]

## 2018-12-01 NOTE — Progress Notes (Signed)
Nutrition Follow-up  RD working remotely.  DOCUMENTATION CODES:   Not applicable  INTERVENTION:   -Increase Ensure Enlive po to TID, each supplement provides 350 kcal and 20 grams of protein -Continue 15 ml liquid MVI daily -30 ml Prostat BID, each supplement provides 100 kcals and 15 grams protein  -Downgrade diet to dysphagia 3 (advanced mechanical soft) for ease of intake  NUTRITION DIAGNOSIS:   Inadequate oral intake related to poor appetite as evidenced by (Loss of >50 lbs in last 3 years.).  Ongoing  GOAL:   Patient will meet greater than or equal to 90% of their needs  Unmet  MONITOR:   PO intake, Weight trends, Supplement acceptance, Diet advancement, Labs, I & O's, Skin  REASON FOR ASSESSMENT:   Malnutrition Screening Tool    ASSESSMENT:   85 y/o female PMHx CHF, CKD2, Afib, CAD, HTN, HLD, IBS, GERD. Presented via EMS at request of PCP after her CT of spine revealed progression of T12 vertebral fracture. EMS also found pt to be hypoxic w/ 02 86% and CXR suggests PNA. Admitted for management of Acute resp failure and potential surgical intervention for T12 compressi  7/16: NPO  - CT guided drain placement related to diverticular abscess IR findings: Pericolonic abscess along left pelvis. 10 Fr drain placed and 20 ml of thick purulent fluid removed.   Reviewed I/O's: +354 ml x 24 hours and +1.6 L since admission  UOP: 450 ml x 24 hours  Drain output: 14 ml x 24 hours  Per chart review, pt refusing therapies today. She has also had some periods of confusion.   Per orthopedics notes, no plans for kyphoplasty until diverticular abscess is resolved.   Intake remains very poor; noted meal completion 0-25%. Per MAR, pt is compliant with Ensure supplements. Given pain and weakness, may benefit from a mechanically altered diet for ease of intake.   Medications reviewed and include prednisone.   Per CSW notes, pt family amenable to SNF placement once medically  stable.   Labs reviewed.   Diet Order:   Diet Order            Diet Heart Room service appropriate? No; Fluid consistency: Thin  Diet effective now              EDUCATION NEEDS:   No education needs have been identified at this time  Skin:  Skin Assessment: Skin Integrity Issues: Skin Integrity Issues:: Other (Comment) Other: MASD to buttocks, groin, and sacrum  Last BM:  11/30/18  Height:   Ht Readings from Last 1 Encounters:  11/21/18 5' 6.5" (1.689 m)    Weight:   Wt Readings from Last 1 Encounters:  12/01/18 64.9 kg    Ideal Body Weight:  60.22 kg  BMI:  Body mass index is 22.74 kg/m.  Estimated Nutritional Needs:   Kcal:  1700-1850 (25-27 kcal/kg bw)  Protein:  83- 96g Pro (1.2-1.4g/kg bw)  Fluid:  <1.2 L (MD ordered fluid restricition)    Tifani Dack A. Jimmye Norman, RD, LDN, [place] Registered Dietitian II Certified Diabetes Care and Education Specialist Pager: [phone] After hours Pager: [phone]

## 2018-12-02 ENCOUNTER — Other Ambulatory Visit: Payer: Self-pay | Admitting: Cardiology

## 2018-12-02 ENCOUNTER — Other Ambulatory Visit: Payer: Self-pay | Admitting: Interventional Cardiology

## 2018-12-02 DIAGNOSIS — K5720 Diverticulitis of large intestine with perforation and abscess without bleeding: Secondary | ICD-10-CM

## 2018-12-02 LAB — NOVEL CORONAVIRUS, NAA (HOSP ORDER, SEND-OUT TO REF LAB; TAT 18-24 HRS): SARS-CoV-2, NAA: NOT DETECTED

## 2018-12-02 LAB — BASIC METABOLIC PANEL
Anion gap: 8 (ref 5–15)
BUN: 22 mg/dL (ref 8–23)
CO2: 32 mmol/L (ref 22–32)
Calcium: 9 mg/dL (ref 8.9–10.3)
Chloride: 101 mmol/L (ref 98–111)
Creatinine, Ser: 1.84 mg/dL — ABNORMAL HIGH (ref 0.44–1.00)
GFR calc Af Amer: 28 mL/min — ABNORMAL LOW (ref >60)
GFR calc non Af Amer: 25 mL/min — ABNORMAL LOW (ref >60)
Glucose, Bld: 94 mg/dL (ref 70–99)
Potassium: 4.7 mmol/L (ref 3.5–5.1)
Sodium: 141 mmol/L (ref 135–145)

## 2018-12-02 LAB — MAGNESIUM: Magnesium: 2.2 mg/dL (ref 1.7–2.4)

## 2018-12-02 NOTE — Progress Notes (Signed)
PROGRESS NOTE    Heather Benson  VEH:[id number] DOB: 05-Feb-1932 DOA: 11/20/2018 PCP: [name], MD   Brief Narrative:  HPI on 11/21/2018 by Dr. Gean Birchwood Heather Benson is a 85 y.o. female with history of chronic systolic heart failure paroxysmal atrial fibrillation CAD hypertension hyperlipidemia with previous vertebroplasty's has been experiencing increasing back pain over the last 2 weeks.  Patient is supposed to see orthopedic but since patient pain is worsening came to the ER.  Patient has been also noted that patient has been getting more short of breath last few days.  Interim history Patient admitted with diverticular abscess and back pain.  Interventional radiology was consulted and drain was placed.  Abscess culture grew Enterococcus faecalis and E. coli both sensitive to IV Zosyn.  Infectious disease recommended amoxicillin for enterococcus and Keflex for E. coli.  Transition to oral antibiotics on 11/30/2018.  PT OT also recommending SNF. Assessment & Plan   Diverticulitis with diverticular abscess -Status post percutaneous drain by interventional radiology -Abscess culture grew Enterococcus faecalis and E. coli both sensitive to Zosyn but no common oral agent -Transitioned to Augmentin and Keflex per ID recommendation.  Antibiotic course depends on duration of drain. -Okay to discharge with drain in place per surgery.  Follow-up with Dr. Alvino Blood in 3 weeks. -IR recommends flushing drain daily with 10 cc sterile saline and outpatient follow-up in 7 to 10 days. -IR to arrange outpatient follow-up. -PT/OT recommended SNF.  Patient now considering SNF close to her home -Palliative care consulted -Inpatient rehab also consulted with feels that patient would not be a candidate due to poor participation and tolerance  Leukocytosis -Likely due to the above -Has improved compared to admission  Back pain likely due to compression fraction at T12/L5 -IR suggests  vertebroplasty when diverticular abscess is controlled -Pain fairly controlled  Paroxysmal atrial fibrillation -Currently in sinus rhythm, continue amiodarone, Coreg, Eliquis  Chronic diastolic CHF -Currently stable and euvolemic -Continue home Lasix  Acute kidney injury on chronic kidney disease, stage IV -AKI resolved -Baseline creatinine appears to be 1.6-1.8 upon review of patient's chart dating back to 2019  Anemia of chronic disease -Hemoglobin stable  Acute respiratory failure with hypoxia -Resolved  DVT Prophylaxis  Eliquis  Code Status: DNR  Family Communication: None at bedside  Disposition Plan: Admitted. Pending SNF  Consultants Interventional radiology Inpatient rehab Infectious disease, via phone by Dr. Cyndia Skeeters General surgery Palliative care  Procedures  CT-guided drainage of pelvic abscess, drain placement by interventional radiology  Antibiotics   Anti-infectives (From admission, onward)   Start     Dose/Rate Route Frequency Ordered Stop   11/30/18 1700  amoxicillin-clavulanate (AUGMENTIN) 500-125 MG per tablet 500 mg     1 tablet Oral 2 times daily with meals 11/30/18 1553 12/14/18 1659   11/30/18 1200  amoxicillin (AMOXIL) capsule 500 mg  Status:  Discontinued     500 mg Oral Every 12 hours 11/30/18 1057 11/30/18 1553   11/30/18 1200  cephALEXin (KEFLEX) capsule 500 mg     500 mg Oral Every 12 hours 11/30/18 1057 12/14/18 0959   11/28/18 1000  piperacillin-tazobactam (ZOSYN) IVPB 3.375 g  Status:  Discontinued     3.375 g 12.5 mL/hr over 240 Minutes Intravenous Every 8 hours 11/28/18 0922 11/30/18 1057   11/24/18 1800  ciprofloxacin (CIPRO) IVPB 400 mg  Status:  Discontinued     400 mg 200 mL/hr over 60 Minutes Intravenous Every 24 hours 11/24/18 1516 11/28/18 8366  11/24/18 1800  metroNIDAZOLE (FLAGYL) IVPB 500 mg  Status:  Discontinued     500 mg 100 mL/hr over 60 Minutes Intravenous Every 8 hours 11/24/18 1516 11/28/18 0922   11/21/18  2100  cefTRIAXone (ROCEPHIN) 2 g in sodium chloride 0.9 % 100 mL IVPB  Status:  Discontinued     2 g 200 mL/hr over 30 Minutes Intravenous Every 24 hours 11/21/18 0421 11/24/18 1602   11/21/18 2100  azithromycin (ZITHROMAX) 500 mg in sodium chloride 0.9 % 250 mL IVPB  Status:  Discontinued     500 mg 250 mL/hr over 60 Minutes Intravenous Every 24 hours 11/21/18 0421 11/24/18 1602   11/20/18 2215  cefTRIAXone (ROCEPHIN) 1 g in sodium chloride 0.9 % 100 mL IVPB     1 g 200 mL/hr over 30 Minutes Intravenous  Once 11/20/18 2204 11/20/18 2321   11/20/18 2215  azithromycin (ZITHROMAX) 500 mg in sodium chloride 0.9 % 250 mL IVPB     500 mg 250 mL/hr over 60 Minutes Intravenous  Once 11/20/18 2204 11/20/18 2352      Subjective:   Heather Benson seen and examined today. Has no complaints this morning. Tells me she feels she is at home. Denies current chest pain, shortness of breath, abdominal pain, N/V/D/C.   Objective:   Vitals:   12/02/18 0629 12/02/18 0635 12/02/18 0639 12/02/18 0946  BP: 138/67   119/61  Pulse: 71   71  Resp: 16     Temp: 98.6 F (37 C)     TempSrc: Oral     SpO2: 95% (!) 88% 94%   Weight:      Height:        Intake/Output Summary (Last 24 hours) at 12/02/2018 1514 Last data filed at 12/02/2018 0955 Gross per 24 hour  Intake 280 ml  Output 315 ml  Net -35 ml   Filed Weights   11/30/18 0606 12/01/18 0615 12/02/18 0500  Weight: 68 kg 64.9 kg 66.7 kg    Exam  General: Well developed, well nourished, NAD, appears stated age  66: NCAT, mucous membranes moist.   Neck: Supple  Cardiovascular: S1 S2 auscultated, RRR, no murmur  Respiratory: Clear to auscultation bilaterally with equal chest rise  Abdomen: Soft, nontender, nondistended, + bowel sounds  Extremities: warm dry without cyanosis clubbing or edema  Neuro: AAOx2 (self, time, needed to be re-oriented), nonfocal  Psych: Normal affect and demeanor   Data Reviewed: I have personally  reviewed following labs and imaging studies  CBC: Recent Labs  Lab 11/26/18 0358  11/27/18 0418  11/29/18 1449 11/29/18 1821 11/30/18 0254 11/30/18 1156 12/01/18 0523  WBC 10.2  --  11.7*  --  12.6*  --   --   --  12.6*  NEUTROABS 7.2  --   --   --  10.5*  --   --   --   --   HGB 12.1  12.1   < > 12.2   < > 12.7 13.6 12.1 12.3 12.4  HCT 40.6  41.1   < > 41.6   < > 43.2 45.4 40.4 41.7 41.7  MCV 93.5  --  94.8  --  94.1  --   --   --  94.3  PLT 235  --  231  --  237  --   --   --  230   < > = values in this interval not displayed.   Basic Metabolic Panel: Recent Labs  Lab 11/26/18 0358 11/27/18  9767 11/29/18 1449 12/01/18 0424 12/02/18 0346  NA 142 140 139 141 141  K 4.5 4.6 5.2* 4.7 4.7  CL 104 104 101 102 101  CO2 29 29 30 31  32  GLUCOSE 95 157* 117* 108* 94  BUN 16 16 15 20 22   CREATININE 1.48* 1.46* 1.74* 1.63* 1.84*  CALCIUM 8.7* 8.5* 9.0 8.8* 9.0  MG  --  1.9 2.0 2.2 2.2  PHOS  --  3.2 3.1  --   --    GFR: Estimated Creatinine Clearance: 21.3 mL/min (A) (by C-G formula based on SCr of 1.84 mg/dL (H)). Liver Function Tests: Recent Labs  Lab 11/27/18 0418 11/29/18 1449  ALBUMIN 2.0* 2.1*   No results for input(s): LIPASE, AMYLASE in the last 168 hours. No results for input(s): AMMONIA in the last 168 hours. Coagulation Profile: No results for input(s): INR, PROTIME in the last 168 hours. Cardiac Enzymes: No results for input(s): CKTOTAL, CKMB, CKMBINDEX, TROPONINI in the last 168 hours. BNP (last 3 results) No results for input(s): PROBNP in the last 8760 hours. HbA1C: No results for input(s): HGBA1C in the last 72 hours. CBG: No results for input(s): GLUCAP in the last 168 hours. Lipid Profile: No results for input(s): CHOL, HDL, LDLCALC, TRIG, CHOLHDL, LDLDIRECT in the last 72 hours. Thyroid Function Tests: No results for input(s): TSH, T4TOTAL, FREET4, T3FREE, THYROIDAB in the last 72 hours. Anemia Panel: No results for input(s): VITAMINB12,  FOLATE, FERRITIN, TIBC, IRON, RETICCTPCT in the last 72 hours. Urine analysis:    Component Value Date/Time   COLORURINE YELLOW 11/20/2018 1421   APPEARANCEUR CLOUDY (A) 11/20/2018 1421   APPEARANCEUR Clear 07/17/2017 1354   LABSPEC 1.017 11/20/2018 1421   PHURINE 5.0 11/20/2018 1421   GLUCOSEU NEGATIVE 11/20/2018 1421   HGBUR LARGE (A) 11/20/2018 1421   BILIRUBINUR NEGATIVE 11/20/2018 1421   BILIRUBINUR Negative 07/17/2017 1354   KETONESUR 5 (A) 11/20/2018 1421   PROTEINUR NEGATIVE 11/20/2018 1421   UROBILINOGEN 0.2 02/05/2011 0915   NITRITE POSITIVE (A) 11/20/2018 1421   LEUKOCYTESUR LARGE (A) 11/20/2018 1421   Sepsis Labs: @LABRCNTIP (procalcitonin:4,lacticidven:4)  ) Recent Results (from the past 240 hour(s))  Surgical pcr screen     Status: Abnormal   Collection Time: 11/25/18 10:58 PM   Specimen: Nasal Mucosa; Nasal Swab  Result Value Ref Range Status   MRSA, PCR POSITIVE (A) NEGATIVE Final   Staphylococcus aureus POSITIVE (A) NEGATIVE Final    Comment: RESULT CALLED TO, READ BACK BY AND VERIFIED WITH: C ROBINSON RN 11/26/18 0125 JDW (NOTE) The Xpert SA Assay (FDA approved for NASAL specimens in patients 84 years of age and older), is one component of a comprehensive surveillance program. It is not intended to diagnose infection nor to guide or monitor treatment. Performed at [facility], [place] [address], [place], [place] [address]   Aerobic/Anaerobic Culture (surgical/deep wound)     Status: None   Collection Time: 11/26/18  1:48 PM   Specimen: Abscess  Result Value Ref Range Status   Specimen Description ABSCESS PERICOLONIC  Final   Special Requests NONE  Final   Gram Stain   Final    FEW WBC PRESENT, PREDOMINANTLY PMN FEW GRAM POSITIVE RODS FEW GRAM POSITIVE COCCI IN CLUSTERS    Culture   Final    MODERATE ENTEROCOCCUS FAECALIS FEW ESCHERICHIA COLI FEW BACTEROIDES FRAGILIS FEW BACTEROIDES OVATUS BETA LACTAMASE POSITIVE Performed at [facility], [place] [address], [place], [place] [address]    Report Status  12/01/2018 FINAL  Final   Organism ID, Bacteria ENTEROCOCCUS FAECALIS  Final   Organism ID, Bacteria ESCHERICHIA COLI  Final      Susceptibility   Escherichia coli - MIC*    AMPICILLIN >=32 RESISTANT Resistant     CEFAZOLIN <=4 SENSITIVE Sensitive     CEFEPIME <=1 SENSITIVE Sensitive     CEFTAZIDIME <=1 SENSITIVE Sensitive     CEFTRIAXONE <=1 SENSITIVE Sensitive     CIPROFLOXACIN >=4 RESISTANT Resistant     GENTAMICIN <=1 SENSITIVE Sensitive     IMIPENEM <=0.25 SENSITIVE Sensitive     TRIMETH/SULFA <=20 SENSITIVE Sensitive     AMPICILLIN/SULBACTAM >=32 RESISTANT Resistant     PIP/TAZO <=4 SENSITIVE Sensitive     Extended ESBL NEGATIVE Sensitive     * FEW ESCHERICHIA COLI   Enterococcus faecalis - MIC*    AMPICILLIN <=2 SENSITIVE Sensitive     VANCOMYCIN 1 SENSITIVE Sensitive     GENTAMICIN SYNERGY SENSITIVE Sensitive     * MODERATE ENTEROCOCCUS FAECALIS  Novel Coronavirus, NAA (hospital order; send-out to ref lab)     Status: None   Collection Time: 12/01/18  1:15 PM   Specimen: Nasopharyngeal Swab; Respiratory  Result Value Ref Range Status   SARS-CoV-2, NAA NOT DETECTED NOT DETECTED Final    Comment: (NOTE) This test was developed and its performance characteristics determined by [employer]. This test has not been FDA cleared or approved. This test has been authorized by FDA under an Emergency Use Authorization (EUA). This test is only authorized for the duration of time the declaration that circumstances exist justifying the authorization of the emergency use of in vitro diagnostic tests for detection of SARS-CoV-2 virus and/or diagnosis of COVID-19 infection under section 564(b)(1) of the Act, 21 U.S.C. 703JKK-9(F)(8), unless the authorization is terminated or revoked sooner. When diagnostic testing is negative, the possibility of a false negative result should be considered in the context of a  patient's recent exposures and the presence of clinical signs and symptoms consistent with COVID-19. An individual without symptoms of COVID-19 and who is not shedding SARS-CoV-2 virus would expect to have a negative (not detected) result in this assay. Performed  At: [facility] [address] [place], [place] [id number] Rush Farmer MD RC:[id number]    [place]  Final    Comment: Performed at [facility], [place] [address], [place], [place] [address]      Radiology Studies: No results found.   Scheduled Meds: . amiodarone  200 mg Oral Daily  . amoxicillin-clavulanate  1 tablet Oral BID WC  . apixaban  2.5 mg Oral BID  . atorvastatin  10 mg Oral Daily  . carvedilol  3.125 mg Oral BID  . cephALEXin  500 mg Oral Q12H  . feeding supplement (ENSURE ENLIVE)  237 mL Oral TID BM  . feeding supplement (PRO-STAT SUGAR FREE 64)  30 mL Oral BID WC  . furosemide  20 mg Oral Daily  . latanoprost  1 drop Both Eyes QHS  . mouth rinse  15 mL Mouth Rinse BID  . mirtazapine  15 mg Oral QHS  . multivitamin  15 mL Oral Daily  . potassium chloride  10 mEq Oral Daily  . predniSONE  10 mg Oral Q breakfast  . sodium chloride flush  10-40 mL Intracatheter Q12H  . sodium chloride flush  5 mL Intracatheter Q8H   Continuous Infusions: . sodium chloride Stopped (11/30/18 2301)     LOS: 12 days  Time Spent in minutes   30 minutes  Tanashia Ciesla D.O. on 12/02/2018 at 3:14 PM  Between 7am to 7pm - Please see pager noted on [email or web address]  After 7pm go to [email or web address]  And look for the night coverage person covering for me after hours  [facility] Office  [phone]

## 2018-12-02 NOTE — Progress Notes (Signed)
PT Cancellation Note  Patient Details Name: Heather Benson MRN: [id number] DOB: 13-Jun-1931   Cancelled Treatment:    Reason Eval/Treat Not Completed: Patient declined, no reason specified. Pt in bed on arrival with untouched lunch in front of her. Pt reports she feels too sick and dizzy to participate with therapy. VSS. She requested PTA remove lunch from her tray. Pt has refused food several times since being admitted, and discussed with pt this could contribute to her lack of energy. Will check back as time allows.  Benjiman Core, PTA Pager [phone] Acute Rehab  Allena Katz 12/02/2018, 1:19 PM

## 2018-12-02 NOTE — Progress Notes (Addendum)
Rehab Admissions Coordinator Note:  Per SW request, this patient was screened by [name] for appropriateness for an Inpatient Acute Rehab Consult. Due to poor participation and tolerance with therapies, AC is recommending Heather Benson. Anticipate pt will need a slower-paced, longer term rehab stay. Please call if questions.   [name] 12/02/2018, 2:59 PM  I can be reached at [phone].

## 2018-12-02 NOTE — Progress Notes (Signed)
Occupational Therapy Treatment Patient Details Name: Heather Benson MRN: [id number] DOB: 12/24/1931 Today's Date: 12/02/2018    History of present illness 85 y.o. female with history of chronic systolic heart failure paroxysmal atrial fibrillation CAD hypertension hyperlipidemia with previous vertebroplasty's has been experiencing increasing back pain over the last 2 weeks.  Patient is supposed to see orthopedic but since patient pain is worsening came to the ER.  Patient has been also noted that patient has been getting more short of breath last few days. Pt is now s/p IR drain placement secondary to diverticular abscess.    OT comments  Pt is making limited progress towards goals.  Pt self-limiting participation in therapy sessions, educated on purpose of therapy with pt willing to attempt to sit at EOB.  Engaged in rolling with mod assist and max encouragement.  Pt reporting nausea and dizziness with mobility.  Pt reports room "spinning". Educated on gaze stabilization during mobility. Repositioned in bed with assist from RN.  RN to assess vitals.   Follow Up Recommendations  SNF;Supervision/Assistance - 24 hour    Equipment Recommendations  Other (comment)(defer to next venue of care)       Precautions / Restrictions Precautions Precautions: Back;Fall Precaution Comments: Verbally reviewed back precautions with pt.        Mobility Bed Mobility Overal bed mobility: Needs Assistance Bed Mobility: Rolling Rolling: Mod assist                   ADL either performed or assessed with clinical judgement   ADL Overall ADL's : Needs assistance/impaired                                       General ADL Comments: max A for hygiene after incontinence of BM     Vision Patient Visual Report: No change from baseline Additional Comments: Pt does report increased dizziness at rest and with rolling          Cognition Arousal/Alertness: Awake/alert Behavior  During Therapy: Flat affect Overall Cognitive Status: Impaired/Different from baseline Area of Impairment: Memory                     Memory: Decreased short-term memory                             Pertinent Vitals/ Pain       Pain Assessment: Faces Faces Pain Scale: Hurts little more Pain Location: back Pain Descriptors / Indicators: Discomfort;Grimacing Pain Intervention(s): Limited activity within patient's tolerance;Monitored during session;Repositioned         Frequency  Min 2X/week        Progress Toward Goals  OT Goals(current goals can now be found in the care plan section)  Progress towards OT goals: Not progressing toward goals - comment  Acute Rehab OT Goals Patient Stated Goal: for pain to improve OT Goal Formulation: With patient Time For Goal Achievement: 12/14/18 Potential to Achieve Goals: Good  Plan Discharge plan remains appropriate       AM-PAC OT "6 Clicks" Daily Activity     Outcome Measure   Help from another person eating meals?: A Little Help from another person taking care of personal grooming?: None Help from another person toileting, which includes using toliet, bedpan, or urinal?: Total Help from another person bathing (including washing, rinsing, drying)?: A  Lot Help from another person to put on and taking off regular upper body clothing?: A Lot Help from another person to put on and taking off regular lower body clothing?: Total 6 Click Score: 13    End of Session Equipment Utilized During Treatment: Oxygen  OT Visit Diagnosis: Pain;Muscle weakness (generalized) (M62.81) Pain - part of body: (back)   Activity Tolerance Patient limited by lethargy;Patient limited by pain   Patient Left in bed;with call bell/phone within reach;with nursing/sitter in room   Nurse Communication (decreased O2 sats and incontinence)        Time: 1456-1510 OT Time Calculation (min): 14 min  Charges: OT General Charges $OT  Visit: 1 Visit OT Treatments $Self Care/Home Management : 8-22 mins    Simonne Come, [phone] 12/02/2018, 3:27 PM

## 2018-12-02 NOTE — Progress Notes (Signed)
Referring Physician(s): Dr Jonny Ruiz  Supervising Physician: Corrie Mckusick  Patient Status:  Heather [facility] - In-pt  Chief Complaint:  Diverticular abscess Drain placed 7/16 in IR  Subjective:  Drain is intact 10 cc out yesterday [place] thin liquid in [place] with no to little back pain * if back pain is resolving-- will not be a candidate for nor need KP ultimately   Allergies: Augmentin [amoxicillin-pot clavulanate], Boniva [ibandronic acid], Codeine, Hydromorphone, Latex, Lisinopril, Amoxicillin, Clavulanic acid, and Adhesive [tape]  Medications: Prior to Admission medications   Medication Sig Start Date End Date Taking? Authorizing Provider  acetaminophen (TYLENOL) 500 MG tablet Take 1,000 mg by mouth every 8 (eight) hours as needed (for pain).    Yes [provider]  amiodarone (PACERONE) 200 MG tablet TAKE 1 TABLET BY MOUTH EVERY DAY Patient taking differently: Take 200 mg by mouth daily.  09/21/18  Yes Jettie Booze, MD  apixaban (ELIQUIS) 2.5 MG TABS tablet Take 1 tablet (2.5 mg total) by mouth 2 (two) times daily. PLEASE START ON 02/03/18 AT 9PM 02/03/18  Yes Bonnielee Haff, MD  atorvastatin (LIPITOR) 10 MG tablet Take 1 tablet (10 mg total) by mouth daily. 09/08/17  Yes Jettie Booze, MD  carvedilol (COREG) 3.125 MG tablet Take 1 tablet (3.125 mg total) by mouth 2 (two) times daily. 06/30/18 11/20/18 Yes Dunn, Dayna N, PA-C  furosemide (LASIX) 20 MG tablet Take 1 tablet (20 mg total) by mouth daily. 02/03/18  Yes Bonnielee Haff, MD  HYDROcodone-acetaminophen (NORCO/VICODIN) 5-325 MG tablet Take 1 tablet by mouth 2 (two) times daily as needed for pain.   Yes [provider]  latanoprost (XALATAN) 0.005 % ophthalmic solution Place 1 drop into both eyes at bedtime. 08/11/14  Yes [provider]  mirtazapine (REMERON) 15 MG tablet Take 15 mg by mouth at bedtime.   Yes [provider]  nitroGLYCERIN (NITROSTAT) 0.4 MG SL tablet Place 1  tablet (0.4 mg total) under the tongue every 5 (five) minutes as needed for chest pain. 03/20/18 11/20/18 Yes Weaver, Scott T, PA-C  polyethylene glycol (MIRALAX / GLYCOLAX) packet Take 17 g by mouth daily as needed. 02/03/18  Yes Bonnielee Haff, MD  potassium chloride (K-DUR) 10 MEQ tablet Take 10 mEq by mouth daily.   Yes [provider]  predniSONE (DELTASONE) 10 MG tablet Take 10 mg by mouth daily with breakfast.   Yes [provider]  Respiratory Therapy Supplies (FLUTTER) DEVI Use as directed. 02/06/16   Marshell Garfinkel, MD     Vital Signs: BP 138/67 (BP Location: Left Arm)   Pulse 71   Temp 98.6 F (37 C) (Oral)   Resp 16   Ht 5' 6.5" (1.689 m)   Wt 147 lb (66.7 kg)   SpO2 94%   BMI 23.37 kg/m   Physical Exam Vitals signs reviewed.  Skin:    General: Skin is warm and dry.     Comments: Site is clean and dry NT No bleeding OP dark brown 10 cc yesterday 10 cc in JP now  Neurological:     Mental Status: She is alert and oriented to person, place, and time.  Psychiatric:        Behavior: Behavior normal.     Imaging: No results found.  Labs:  CBC: Recent Labs    11/26/18 0358  11/27/18 0418  11/29/18 1449 11/29/18 1821 11/30/18 0254 11/30/18 1156 12/01/18 0523  WBC 10.2  --  11.7*  --  12.6*  --   --   --  12.6*  HGB 12.1  12.1   < > 12.2   < > 12.7 13.6 12.1 12.3 12.4  HCT 40.6  41.1   < > 41.6   < > 43.2 45.4 40.4 41.7 41.7  PLT 235  --  231  --  237  --   --   --  230   < > = values in this interval not displayed.    COAGS: Recent Labs    01/26/18 1016  11/22/18 1310 11/22/18 2219 11/23/18 0337 11/24/18 0225 11/25/18 1203  INR 1.07  --   --   --   --   --  1.1  APTT 28   < > 59* 100* 71* 98*  --    < > = values in this interval not displayed.    BMP: Recent Labs    11/27/18 0418 11/29/18 1449 12/01/18 0424 12/02/18 0346  NA 140 139 141 141  K 4.6 5.2* 4.7 4.7  CL 104 101 102 101  CO2 29 30 31  32  GLUCOSE 157*  117* 108* 94  BUN 16 15 20 22   CALCIUM 8.5* 9.0 8.8* 9.0  CREATININE 1.46* 1.74* 1.63* 1.84*  GFRNONAA 32* 26* 28* 25*  GFRAA 38* 30* 33* 28*    LIVER FUNCTION TESTS: Recent Labs    01/29/18 0458  09/24/18 1102 11/20/18 2216 11/21/18 0517 11/27/18 0418 11/29/18 1449  BILITOT 0.8  --  0.8 1.2 1.2  --   --   AST 28  --  38* 25 24  --   --   ALT 30  --  33 18 15  --   --   ALKPHOS 103  --  322* 101 103  --   --   PROT 4.6*  --  5.7* 5.8* 5.3*  --   --   ALBUMIN 2.1*   < > 3.1* 2.2* 2.1* 2.0* 2.1*   < > = values in this interval not displayed.    Assessment and Plan:  Will follow drain OP order in for OP IR Clinic follow up She will hear from scheduler for time and date Will need to flush once daily 10 cc sterile saline Keep record of OP Plan for follow up with IR 7-10 days  Electronically Signed: Lavonia Drafts, PA-C 12/02/2018, 8:44 AM   I spent a total of 15 Minutes at the the patient's bedside AND on the patient's [facility] floor or unit, greater than 50% of which was counseling/coordinating care for diverticular abscess drain

## 2018-12-03 ENCOUNTER — Inpatient Hospital Stay (HOSPITAL_COMMUNITY): Payer: Medicare Other

## 2018-12-03 DIAGNOSIS — D72829 Elevated white blood cell count, unspecified: Secondary | ICD-10-CM

## 2018-12-03 DIAGNOSIS — R627 Adult failure to thrive: Secondary | ICD-10-CM

## 2018-12-03 LAB — APTT: aPTT: 58 seconds — ABNORMAL HIGH (ref 24–36)

## 2018-12-03 LAB — CBC
HCT: 40.6 % (ref 36.0–46.0)
Hemoglobin: 12.1 g/dL (ref 12.0–15.0)
MCH: 27.8 pg (ref 26.0–34.0)
MCHC: 29.8 g/dL — ABNORMAL LOW (ref 30.0–36.0)
MCV: 93.3 fL (ref 80.0–100.0)
Platelets: 241 10*3/uL (ref 150–400)
RBC: 4.35 MIL/uL (ref 3.87–5.11)
RDW: 17.1 % — ABNORMAL HIGH (ref 11.5–15.5)
WBC: 17.4 10*3/uL — ABNORMAL HIGH (ref 4.0–10.5)
nRBC: 0 % (ref 0.0–0.2)

## 2018-12-03 LAB — BASIC METABOLIC PANEL
Anion gap: 9 (ref 5–15)
BUN: 27 mg/dL — ABNORMAL HIGH (ref 8–23)
CO2: 29 mmol/L (ref 22–32)
Calcium: 8.6 mg/dL — ABNORMAL LOW (ref 8.9–10.3)
Chloride: 100 mmol/L (ref 98–111)
Creatinine, Ser: 1.52 mg/dL — ABNORMAL HIGH (ref 0.44–1.00)
GFR calc Af Amer: 36 mL/min — ABNORMAL LOW (ref >60)
GFR calc non Af Amer: 31 mL/min — ABNORMAL LOW (ref >60)
Glucose, Bld: 110 mg/dL — ABNORMAL HIGH (ref 70–99)
Potassium: 5 mmol/L (ref 3.5–5.1)
Sodium: 138 mmol/L (ref 135–145)

## 2018-12-03 LAB — HEPARIN LEVEL (UNFRACTIONATED): Heparin Unfractionated: 1.92 IU/mL — ABNORMAL HIGH (ref 0.30–0.70)

## 2018-12-03 MED ORDER — IOPAMIDOL (ISOVUE-300) INJECTION 61%
80.0000 mL | Freq: Once | INTRAVENOUS | Status: AC | PRN
  Administered 2018-12-03: 14:00:00 80 mL via INTRAVENOUS

## 2018-12-03 MED ORDER — HEPARIN (PORCINE) 25000 UT/250ML-% IV SOLN
950.0000 [IU]/h | INTRAVENOUS | Status: DC
  Administered 2018-12-03: 850 [IU]/h via INTRAVENOUS
  Filled 2018-12-03: qty 250

## 2018-12-03 NOTE — Progress Notes (Signed)
ANTICOAGULATION CONSULT NOTE - Initial Consult  Pharmacy Consult for Heparin Indication: atrial fibrillation  Allergies  Allergen Reactions  . Augmentin [Amoxicillin-Pot Clavulanate] Diarrhea and Nausea Only  . Boniva [Ibandronic Acid] Diarrhea  . Codeine Other (See Comments)    "Spaced out feeling" and Lightheadedness  . Hydromorphone Nausea And Vomiting  . Latex Rash    "Burns me"  . Lisinopril Cough  . Amoxicillin   . Clavulanic Acid   . Adhesive [Tape] Itching and Rash    Patient Measurements: Height: 5' 6.5" (168.9 cm) Weight: 144 lb (65.3 kg) IBW/kg (Calculated) : 60.45 Heparin Dosing Weight: 65.3  Vital Signs: Temp: 99.9 F (37.7 C) (07/23 0634) BP: 144/54 (07/23 0959) Pulse Rate: 66 (07/23 0959)  Labs: Recent Labs    11/30/18 1156 12/01/18 0424 12/01/18 0523 12/02/18 0346 12/03/18 0500  HGB 12.3  --  12.4  --  12.1  HCT 41.7  --  41.7  --  40.6  PLT  --   --  230  --  241  CREATININE  --  1.63*  --  1.84*  --     Estimated Creatinine Clearance: 21.3 mL/min (A) (by C-G formula based on SCr of 1.84 mg/dL (H)).   Medical History: Past Medical History:  Diagnosis Date  . Aortic atherosclerosis ([place]) 12/25/2016  . Aortic regurgitation    a. mild-mod by echo 01/2018.  [name] Aortic stenosis, mild 12/14/2015  . Arthritis 08-28-11   right knee pain-surgery planned  . CAD (coronary artery disease)    a. by cath 2012. b. Elev troponin 01/2018 -> cath not pursued due to renal dysfunction.  . Cancer [facility]) 08-28-11   Melonoma-cheek(many Yrs ago) , recent bx. left  face lesion, past skin cancer lesions  . CAP (community acquired pneumonia) 12/13/2015  . Chronic combined systolic and diastolic heart failure (HCC)    prob ischemic CM // admx with NSTEMI in 9/19 in setting of pneumonia // Echo 9/19: EF 35-40 // LHC avoided due to CKD //   . Complication of anesthesia 08-28-11   in past arrythmia-  cardiology has seen in past  . Dilated cardiomyopathy ([place]) 03/20/2018   admx with NSTEMI in setting of prob pneumonia in 9/19 >> prob ischemic CM // Echo 9/19:  EF 35-40, anteroseptal, anterior, inferior, apical hypokinesis consistent with ischemia in the distribution of the LAD, grade 1 diastolic dysfunction, mild aortic stenosis (mean 7, peak 13), mild aortic insufficiency, moderate MAC, mild to moderate MR, moderate to severe LAE, mild RAE, moderate TR, PASP 59 //   . GERD (gastroesophageal reflux disease) 08-28-11   tx. omeprazole  . Hypercholesterolemia   . Hypertension 08-28-11   tx. meds  . IBS (irritable bowel syndrome)   . Lymphedema   . Mild mitral regurgitation   . Neuromuscular disorder ([place]) 08-28-11   hx. Polio age 73-slight residual affects legs  . Osteoarthritis    back and left knee  . Osteoporosis   . PAF (paroxysmal atrial fibrillation) ([place])   . Pneumonia 12/2015   hospital x 3 days  . PONV (postoperative nausea and vomiting)   . Renal cyst, right   . Sarcoidosis   . SVT (supraventricular tachycardia) ([place])   . Swelling of extremity 08-28-11   bilateral lower extremities- mid calf down   Assessment: Pt 85 yo F with hx of Afib, on apixaban PTA. Was on Apixaban 2.5 mg PO bid but holding for possible surgery. Transition to heparin.   Goal of Therapy:  Heparin level 0.3-0.7  units/ml aPTT 66-102 seconds Monitor platelets by anticoagulation protocol: Yes   Plan:  D/c Apixaban 2.5 mg PO bid as PTA -  For possible surgery Start Heparin gtt 850 units/hr, no bolus at 1100 HL/aPTT 8hr after starting  Daily HL/aPTT, CBC Monitor for bleeding   Desirea Mizrahi A. Levada Dy, PharmD, [place] Pager: [phone] Please utilize Amion for appropriate phone number to reach the unit pharmacist ([place])

## 2018-12-03 NOTE — Progress Notes (Addendum)
Physical Therapy Treatment Patient Details Name: Heather Benson MRN: [id number] DOB: 1932/04/21 Today's Date: 12/03/2018    History of Present Illness 85 y.o. female with history of chronic systolic heart failure paroxysmal atrial fibrillation CAD hypertension hyperlipidemia with previous vertebroplasty's has been experiencing increasing back pain over the last 2 weeks.  Patient is supposed to see orthopedic but since patient pain is worsening came to the ER.  Patient has been also noted that patient has been getting more short of breath last few days. Pt is now s/p IR drain placement secondary to diverticular abscess.     PT Comments    Patient with minimal motivation this visit, agreeable to bed level therex. Patient screaming with light touch to her skin today.  Cont to rec SNF at this time.     Follow Up Recommendations  SNF;Supervision/Assistance - 24 hour     Equipment Recommendations  Other (comment)(TBD)    Recommendations for Other Services       Precautions / Restrictions Precautions Precautions: Back;Fall Precaution Booklet Issued: No Precaution Comments: Verbally reviewed back precautions with pt.  Restrictions Weight Bearing Restrictions: No    Mobility  Bed Mobility Overal bed mobility: Needs Assistance Bed Mobility: Rolling;Sidelying to Sit;Sit to Sidelying Rolling: Mod assist Sidelying to sit: Mod assist     Sit to sidelying: Mod assist General bed mobility comments: Mod A for rolling and for trunk elevation to come to sitting. Pt only able to tolerate brief period in sitting before requesting to lie back down. Required mod A for LE assist for return to sidelying. Cues for sequencing to use log roll technique.   Transfers                 General transfer comment: Unable secondary to pain   Ambulation/Gait                 Stairs             Wheelchair Mobility    Modified Rankin (Stroke Patients Only)       Balance                                             Cognition Arousal/Alertness: Awake/alert Behavior During Therapy: Flat affect Overall Cognitive Status: Impaired/Different from baseline Area of Impairment: Memory                     Memory: Decreased short-term memory                Exercises General Exercises - Lower Extremity Ankle Circles/Pumps: AROM;Both;20 reps;Supine Quad Sets: AROM;Both;10 reps;Supine Heel Slides: 10 reps Hip ABduction/ADduction: 10 reps Straight Leg Raises: 10 reps    General Comments        Pertinent Vitals/Pain Faces Pain Scale: Hurts whole lot Pain Location: back Pain Descriptors / Indicators: Discomfort;Grimacing    Home Living                      Prior Function            PT Goals (current goals can now be found in the care plan section) Acute Rehab PT Goals Patient Stated Goal: for pain to improve PT Goal Formulation: With patient Time For Goal Achievement: 12/06/18 Potential to Achieve Goals: Fair Progress towards PT goals: Progressing toward goals    Frequency  Min 2X/week      PT Plan Current plan remains appropriate    Co-evaluation              AM-PAC PT "6 Clicks" Mobility   Outcome Measure  Help needed turning from your back to your side while in a flat bed without using bedrails?: A Lot Help needed moving from lying on your back to sitting on the side of a flat bed without using bedrails?: A Lot Help needed moving to and from a bed to a chair (including a wheelchair)?: Total Help needed standing up from a chair using your arms (e.g., wheelchair or bedside chair)?: Total Help needed to walk in hospital room?: Total Help needed climbing 3-5 steps with a railing? : Total 6 Click Score: 8    End of Session   Activity Tolerance: Patient limited by pain Patient left: in bed;with call bell/phone within reach Nurse Communication: Mobility status PT Visit Diagnosis:  Unsteadiness on feet (R26.81)     Time: 1610-9604 PT Time Calculation (min) (ACUTE ONLY): 15 min  Charges:  $Therapeutic Exercise: 8-22 mins                     Reinaldo Berber, PT, DPT Acute Rehabilitation Services Pager: [phone] Office: [place] 12/03/2018, 4:33 PM

## 2018-12-03 NOTE — Progress Notes (Signed)
Patient ID: Heather Benson, female   DOB: Jun 03, 1931, 85 y.o.   MRN: [id number]  This NP visited patient at the bedside as a follow up to  yesterday's [place].  Patient appears weaker today and more lethargic today,  speech is garbled Increased abdominal discomfort and elevated WBCs.  Further workup pending.   At this time family is open to all offered and available medical interventions to prolong life.  Spoke with son/early evening and expressed by concerns again for her visible decline, she is  high risk for decompensation.  Patient's son and I again discussed in great detail the concept of human mortality and the limitations of medical interventions to prolong  quality of when a body begins to fail to thrive.  The family understands that  secondary to the patient's advanced age, multiple co-morbid ites and now further signs of decline her prognosis is limited.  Comfort, quality and dignity of life are priority to all  Encouraged face to face visit for tomorrow.  Family will arrive at 1:00pm, will clear with nursing, I will meet with them.  Discussed with son the importance of continued conversation with  family members, situation appears to be declining and I believe EOL decisions are near.  Emotional support offered  Questions and concerns addressed    Total time spent on the unit was 45 minutes    PMT will continue to support holistically  Greater than 50% of the time was spent in counseling and coordination of care  Wadie Lessen NP  Palliative Medicine Team Team Phone # [phone] Pager [phone]

## 2018-12-03 NOTE — Progress Notes (Signed)
Patient ID: Heather Benson, female   DOB: 01-May-1932, 85 y.o.   MRN: [id number]  This NP visited patient at the bedside as a follow up to  yesterday's [place].  Patient appears weaker today and more lethargic  Spoke to son Merry Proud by telephone for continued conversation regarding current medical situation; treatment options, goals of care, disposition issues and options.  Today patient has been refusing to participate in therapies. Patient tends to waffle back and forth between "wanting to try" and simply wanting to disposition home and let nature take its course.  Patient's son and I discussed in great detail the concept of human mortality and the limitations of medical interventions to prolong  quality of when a body begins to fail to thrive.  The family understands that simply secondary to the patient's advanced age that her  long-term prognosis is limited but remain hopeful for opportunity for CIR rehabilitation, ongoing treatment and ultimate improvement and retrun home.  I offered a vist at bedside to help family see/understand the patient's condition.  They declined thinking t would be worse for the patient to see them and then see them leave :( They continue to maintain contact via telephone.  Comfort, quality and dignity of life are priority to all.  Again hopeful for all offered and available medical interventions to prolong life.  Discussed with son the importance of continued conversation with all family memebers and the  medical providers regarding overall plan of care and treatment options,  ensuring decisions are within the context of the patients values and GOCs.  Questions and concerns addressed   Discussed with  Percell Locus LCSW  Total time spent on the unit was 20 minutes    PMT will continue to support holistically  Greater than 50% of the time was spent in counseling and coordination of care  Wadie Lessen NP  Palliative Medicine Team Team Phone # [phone] Pager  [phone]

## 2018-12-03 NOTE — Progress Notes (Signed)
Patient started today on the heparin drip at 8.5 ml/hr no signs of bleeding or discomfort noted. Will continue to monitor pt.

## 2018-12-03 NOTE — Progress Notes (Addendum)
During handoff with day RN observed patient held tilted to right side on pillow. Asked patient to smile, patient refused and stated she had nothing to smile about. Upon reassessment,iInstructed patient to put teeth together (like gritting teeth), patient followed command and tilted patient's head to reposition; face symmetrical.   Received call from pharmacy at 2016 requesting patient's heparin dosage change to 9.95mL/hr; rate/dose changed per order.  During assessment patient drowsy however awakens to voice and able to state name, DOB and husbands name.  Patient care completed 0040, patient soiled, stool apearing to come from vaginal area. 400 mL brown urine, with what appeared to be sediment/stool, emptied from [facility]. Generalized tenderness when assisted with turning in bed. Antifungal powder applied to groin area. Foam applied to left hand skin tear (removed prior tape/cottonball).   Patient complained of pain with bathing; skin tender and tender abdomen.Stated "oww" while bathing tender areas. Patient able to minimally assist while turning her in bed to bathe.   21mL emptied from JP drain; brown with sediment/stool.  New dressing applied to JP drain site at 0700. Site clean dry and intact.

## 2018-12-03 NOTE — Progress Notes (Signed)
[place] for Heparin Indication: atrial fibrillation  Allergies  Allergen Reactions  . Augmentin [Amoxicillin-Pot Clavulanate] Diarrhea and Nausea Only  . Boniva [Ibandronic Acid] Diarrhea  . Codeine Other (See Comments)    "Spaced out feeling" and Lightheadedness  . Hydromorphone Nausea And Vomiting  . Latex Rash    "Burns me"  . Lisinopril Cough  . Amoxicillin   . Clavulanic Acid   . Adhesive [Tape] Itching and Rash    Patient Measurements: Height: 5' 6.5" (168.9 cm) Weight: 144 lb (65.3 kg) IBW/kg (Calculated) : 60.45 Heparin Dosing Weight: 65.3  Vital Signs: BP: 144/54 (07/23 0959) Pulse Rate: 66 (07/23 0959)  Labs: Recent Labs    12/01/18 0424 12/01/18 0523 12/02/18 0346 12/03/18 0500 12/03/18 0918 12/03/18 1904  HGB  --  12.4  --  12.1  --   --   HCT  --  41.7  --  40.6  --   --   PLT  --  230  --  241  --   --   APTT  --   --   --   --   --  58*  HEPARINUNFRC  --   --   --   --   --  1.92*  CREATININE 1.63*  --  1.84*  --  1.52*  --     Estimated Creatinine Clearance: 25.8 mL/min (A) (by C-G formula based on SCr of 1.52 mg/dL (H)).   Medical History: Past Medical History:  Diagnosis Date  . Aortic atherosclerosis ([place]) 12/25/2016  . Aortic regurgitation    a. mild-mod by echo 01/2018.  [name] Aortic stenosis, mild 12/14/2015  . Arthritis 08-28-11   right knee pain-surgery planned  . CAD (coronary artery disease)    a. by cath 2012. b. Elev troponin 01/2018 -> cath not pursued due to renal dysfunction.  . Cancer [facility]) 08-28-11   Melonoma-cheek(many Yrs ago) , recent bx. left  face lesion, past skin cancer lesions  . CAP (community acquired pneumonia) 12/13/2015  . Chronic combined systolic and diastolic heart failure (HCC)    prob ischemic CM // admx with NSTEMI in 9/19 in setting of pneumonia // Echo 9/19: EF 35-40 // LHC avoided due to CKD //   . Complication of anesthesia 08-28-11   in past arrythmia-  cardiology  has seen in past  . Dilated cardiomyopathy ([place]) 03/20/2018   admx with NSTEMI in setting of prob pneumonia in 9/19 >> prob ischemic CM // Echo 9/19:  EF 35-40, anteroseptal, anterior, inferior, apical hypokinesis consistent with ischemia in the distribution of the LAD, grade 1 diastolic dysfunction, mild aortic stenosis (mean 7, peak 13), mild aortic insufficiency, moderate MAC, mild to moderate MR, moderate to severe LAE, mild RAE, moderate TR, PASP 59 //   . GERD (gastroesophageal reflux disease) 08-28-11   tx. omeprazole  . Hypercholesterolemia   . Hypertension 08-28-11   tx. meds  . IBS (irritable bowel syndrome)   . Lymphedema   . Mild mitral regurgitation   . Neuromuscular disorder ([place]) 08-28-11   hx. Polio age 58-slight residual affects legs  . Osteoarthritis    back and left knee  . Osteoporosis   . PAF (paroxysmal atrial fibrillation) ([place])   . Pneumonia 12/2015   hospital x 3 days  . PONV (postoperative nausea and vomiting)   . Renal cyst, right   . Sarcoidosis   . SVT (supraventricular tachycardia) ([place])   . Swelling of extremity 08-28-11  bilateral lower extremities- mid calf down   Assessment: Pt 85 yo F with hx of Afib, on apixaban PTA. Was on Apixaban 2.5 mg PO bid but holding for possible surgery. Transition to heparin.   APTT this evening 58, below goal range.  No overt bleeding or complications noted.  Per RN, no known issues with IV.  Goal of Therapy:  Heparin level 0.3-0.7 units/ml aPTT 66-102 seconds Monitor platelets by anticoagulation protocol: Yes   Plan:  Increase IV heparin to 950 units/hr. Recheck aPTT, heparin level in 8 hrs. Daily HL/aPTT, CBC Monitor for bleeding   Marguerite Olea, [facility] Clinical Pharmacist Phone [phone]  12/03/2018 8:17 PM

## 2018-12-03 NOTE — Progress Notes (Signed)
PROGRESS NOTE    Heather INGBER  Heather Benson:[id number] DOB: 11-16-1931 DOA: 11/20/2018 PCP: [name], MD   Brief Narrative:  HPI on 11/21/2018 by Dr. Gean Birchwood Heather Benson is a 85 y.o. female with history of chronic systolic heart failure paroxysmal atrial fibrillation CAD hypertension hyperlipidemia with previous vertebroplasty's has been experiencing increasing back pain over the last 2 weeks.  Patient is supposed to see orthopedic but since patient pain is worsening came to the ER.  Patient has been also noted that patient has been getting more short of breath last few days.  Interim history Patient admitted with diverticular abscess and back pain.  Interventional radiology was consulted and drain was placed.  Abscess culture grew Enterococcus faecalis and E. coli both sensitive to IV Zosyn.  Infectious disease recommended amoxicillin for enterococcus and Keflex for E. coli.  Transition to oral antibiotics on 11/30/2018.  PT OT also recommending SNF. Assessment & Plan   Diverticulitis with diverticular abscess -Status post percutaneous drain by interventional radiology -Abscess culture grew Enterococcus faecalis and E. coli both sensitive to Zosyn but no common oral agent -Transitioned to Augmentin and Keflex per ID recommendation.  Antibiotic course depends on duration of drain. -Okay to discharge with drain in place per surgery.  Follow-up with Dr. Alvino Blood in 3 weeks. -IR recommends flushing drain daily with 10 cc sterile saline and outpatient follow-up in 7 to 10 days. -IR to arrange outpatient follow-up. -PT/OT recommended SNF.  Patient now considering SNF close to her home -Palliative care consulted -Inpatient rehab also consulted with feels that patient would not be a candidate due to poor participation and tolerance -Patient with increased leukocytosis today up to 17 and with more abdominal pain. -Discussed with general surgery, will order a CT abdomen pelvis with  and without contrast.  Leukocytosis -Likely due to the above -As above  Back pain likely due to compression fraction at T12/L5 -IR suggests vertebroplasty when diverticular abscess is controlled -Pain fairly controlled  Paroxysmal atrial fibrillation -Currently in sinus rhythm, continue amiodarone, Coreg -Will discontinue Eliquis 2.5 mg BID and place on heparin, in case of any upcoming procedures  Chronic diastolic CHF -Currently stable and euvolemic -Continue home Lasix  Acute kidney injury on chronic kidney disease, stage IV -AKI resolved -Baseline creatinine appears to be 1.6-1.8 upon review of patient's chart dating back to 2019  Anemia of chronic disease -Hemoglobin stable  Acute respiratory failure with hypoxia -Resolved  DVT Prophylaxis  Eliquis  Code Status: DNR  Family Communication: None at bedside  Disposition Plan: Admitted. Pending SNF  Consultants Interventional radiology Inpatient rehab Infectious disease, via phone by Dr. Cyndia Skeeters General surgery Palliative care  Procedures  CT-guided drainage of pelvic abscess, drain placement by interventional radiology  Antibiotics   Anti-infectives (From admission, onward)   Start     Dose/Rate Route Frequency Ordered Stop   11/30/18 1700  amoxicillin-clavulanate (AUGMENTIN) 500-125 MG per tablet 500 mg     1 tablet Oral 2 times daily with meals 11/30/18 1553 12/14/18 1659   11/30/18 1200  amoxicillin (AMOXIL) capsule 500 mg  Status:  Discontinued     500 mg Oral Every 12 hours 11/30/18 1057 11/30/18 1553   11/30/18 1200  cephALEXin (KEFLEX) capsule 500 mg     500 mg Oral Every 12 hours 11/30/18 1057 12/14/18 0959   11/28/18 1000  piperacillin-tazobactam (ZOSYN) IVPB 3.375 g  Status:  Discontinued     3.375 g 12.5 mL/hr over 240 Minutes Intravenous Every 8 hours  11/28/18 0922 11/30/18 1057   11/24/18 1800  ciprofloxacin (CIPRO) IVPB 400 mg  Status:  Discontinued     400 mg 200 mL/hr over 60 Minutes  Intravenous Every 24 hours 11/24/18 1516 11/28/18 0922   11/24/18 1800  metroNIDAZOLE (FLAGYL) IVPB 500 mg  Status:  Discontinued     500 mg 100 mL/hr over 60 Minutes Intravenous Every 8 hours 11/24/18 1516 11/28/18 0922   11/21/18 2100  cefTRIAXone (ROCEPHIN) 2 g in sodium chloride 0.9 % 100 mL IVPB  Status:  Discontinued     2 g 200 mL/hr over 30 Minutes Intravenous Every 24 hours 11/21/18 0421 11/24/18 1602   11/21/18 2100  azithromycin (ZITHROMAX) 500 mg in sodium chloride 0.9 % 250 mL IVPB  Status:  Discontinued     500 mg 250 mL/hr over 60 Minutes Intravenous Every 24 hours 11/21/18 0421 11/24/18 1602   11/20/18 2215  cefTRIAXone (ROCEPHIN) 1 g in sodium chloride 0.9 % 100 mL IVPB     1 g 200 mL/hr over 30 Minutes Intravenous  Once 11/20/18 2204 11/20/18 2321   11/20/18 2215  azithromycin (ZITHROMAX) 500 mg in sodium chloride 0.9 % 250 mL IVPB     500 mg 250 mL/hr over 60 Minutes Intravenous  Once 11/20/18 2204 11/20/18 2352      Subjective:   [name] seen and examined today.  Patient with mild abdominal pain this morning. States her back pain has improved.  Denies current chest pain or shortness of breath.  Would like to be left alone so she can sleep.  Objective:   Vitals:   12/02/18 2140 12/02/18 2307 12/03/18 0631 12/03/18 0634  BP: (!) 129/58 (!) 129/53  (!) 144/55  Pulse: 75 73  66  Resp:      Temp: 99.1 F (37.3 C)   99.9 F (37.7 C)  TempSrc:      SpO2: 96%   100%  Weight:   65.3 kg   Height:        Intake/Output Summary (Last 24 hours) at 12/03/2018 0945 Last data filed at 12/03/2018 6734 Gross per 24 hour  Intake 20 ml  Output 129.5 ml  Net -109.5 ml   Filed Weights   12/01/18 0615 12/02/18 0500 12/03/18 0631  Weight: 64.9 kg 66.7 kg 65.3 kg   Exam  General: Well developed, well nourished, NAD, appears stated age  42: NCAT, mucous membranes moist.   Cardiovascular: S1 S2 auscultated, RRR  Respiratory: Clear to auscultation bilaterally  with equal chest rise  Abdomen: Soft, LLQ TTP, JP drain with minimal output, +BS  Extremities: warm dry without cyanosis clubbing or edema  Neuro: AAOx2 (self, time), nonfocal  Data Reviewed: I have personally reviewed following labs and imaging studies  CBC: Recent Labs  Lab 11/27/18 0418  11/29/18 1449 11/29/18 1821 11/30/18 0254 11/30/18 1156 12/01/18 0523 12/03/18 0500  WBC 11.7*  --  12.6*  --   --   --  12.6* 17.4*  NEUTROABS  --   --  10.5*  --   --   --   --   --   HGB 12.2   < > 12.7 13.6 12.1 12.3 12.4 12.1  HCT 41.6   < > 43.2 45.4 40.4 41.7 41.7 40.6  MCV 94.8  --  94.1  --   --   --  94.3 93.3  PLT 231  --  237  --   --   --  230 241   < > =  values in this interval not displayed.   Basic Metabolic Panel: Recent Labs  Lab 11/27/18 0418 11/29/18 1449 12/01/18 0424 12/02/18 0346  NA 140 139 141 141  K 4.6 5.2* 4.7 4.7  CL 104 101 102 101  CO2 29 30 31  32  GLUCOSE 157* 117* 108* 94  BUN 16 15 20 22   CREATININE 1.46* 1.74* 1.63* 1.84*  CALCIUM 8.5* 9.0 8.8* 9.0  MG 1.9 2.0 2.2 2.2  PHOS 3.2 3.1  --   --    GFR: Estimated Creatinine Clearance: 21.3 mL/min (A) (by C-G formula based on SCr of 1.84 mg/dL (H)). Liver Function Tests: Recent Labs  Lab 11/27/18 0418 11/29/18 1449  ALBUMIN 2.0* 2.1*   No results for input(s): LIPASE, AMYLASE in the last 168 hours. No results for input(s): AMMONIA in the last 168 hours. Coagulation Profile: No results for input(s): INR, PROTIME in the last 168 hours. Cardiac Enzymes: No results for input(s): CKTOTAL, CKMB, CKMBINDEX, TROPONINI in the last 168 hours. BNP (last 3 results) No results for input(s): PROBNP in the last 8760 hours. HbA1C: No results for input(s): HGBA1C in the last 72 hours. CBG: No results for input(s): GLUCAP in the last 168 hours. Lipid Profile: No results for input(s): CHOL, HDL, LDLCALC, TRIG, CHOLHDL, LDLDIRECT in the last 72 hours. Thyroid Function Tests: No results for input(s):  TSH, T4TOTAL, FREET4, T3FREE, THYROIDAB in the last 72 hours. Anemia Panel: No results for input(s): VITAMINB12, FOLATE, FERRITIN, TIBC, IRON, RETICCTPCT in the last 72 hours. Urine analysis:    Component Value Date/Time   COLORURINE YELLOW 11/20/2018 1421   APPEARANCEUR CLOUDY (A) 11/20/2018 1421   APPEARANCEUR Clear 07/17/2017 1354   LABSPEC 1.017 11/20/2018 1421   PHURINE 5.0 11/20/2018 1421   GLUCOSEU NEGATIVE 11/20/2018 1421   HGBUR LARGE (A) 11/20/2018 1421   BILIRUBINUR NEGATIVE 11/20/2018 1421   BILIRUBINUR Negative 07/17/2017 1354   KETONESUR 5 (A) 11/20/2018 1421   PROTEINUR NEGATIVE 11/20/2018 1421   UROBILINOGEN 0.2 02/05/2011 0915   NITRITE POSITIVE (A) 11/20/2018 1421   LEUKOCYTESUR LARGE (A) 11/20/2018 1421   Sepsis Labs: @LABRCNTIP (procalcitonin:4,lacticidven:4)  ) Recent Results (from the past 240 hour(s))  Surgical pcr screen     Status: Abnormal   Collection Time: 11/25/18 10:58 PM   Specimen: Nasal Mucosa; Nasal Swab  Result Value Ref Range Status   MRSA, PCR POSITIVE (A) NEGATIVE Final   Staphylococcus aureus POSITIVE (A) NEGATIVE Final    Comment: RESULT CALLED TO, READ BACK BY AND VERIFIED WITH: C ROBINSON RN 11/26/18 0125 JDW (NOTE) The Xpert SA Assay (FDA approved for NASAL specimens in patients 75 years of age and older), is one component of a comprehensive surveillance program. It is not intended to diagnose infection nor to guide or monitor treatment. Performed at [facility], [place] [address], [place], [place] [address]   Aerobic/Anaerobic Culture (surgical/deep wound)     Status: None   Collection Time: 11/26/18  1:48 PM   Specimen: Abscess  Result Value Ref Range Status   Specimen Description ABSCESS PERICOLONIC  Final   Special Requests NONE  Final   Gram Stain   Final    FEW WBC PRESENT, PREDOMINANTLY PMN FEW GRAM POSITIVE RODS FEW GRAM POSITIVE COCCI IN CLUSTERS    Culture   Final    MODERATE ENTEROCOCCUS FAECALIS FEW  ESCHERICHIA COLI FEW BACTEROIDES FRAGILIS FEW BACTEROIDES OVATUS BETA LACTAMASE POSITIVE Performed at [facility], [place] [address] [place], [place] [address]  Report Status 12/01/2018 FINAL  Final   Organism ID, Bacteria ENTEROCOCCUS FAECALIS  Final   Organism ID, Bacteria ESCHERICHIA COLI  Final      Susceptibility   Escherichia coli - MIC*    AMPICILLIN >=32 RESISTANT Resistant     CEFAZOLIN <=4 SENSITIVE Sensitive     CEFEPIME <=1 SENSITIVE Sensitive     CEFTAZIDIME <=1 SENSITIVE Sensitive     CEFTRIAXONE <=1 SENSITIVE Sensitive     CIPROFLOXACIN >=4 RESISTANT Resistant     GENTAMICIN <=1 SENSITIVE Sensitive     IMIPENEM <=0.25 SENSITIVE Sensitive     TRIMETH/SULFA <=20 SENSITIVE Sensitive     AMPICILLIN/SULBACTAM >=32 RESISTANT Resistant     PIP/TAZO <=4 SENSITIVE Sensitive     Extended ESBL NEGATIVE Sensitive     * FEW ESCHERICHIA COLI   Enterococcus faecalis - MIC*    AMPICILLIN <=2 SENSITIVE Sensitive     VANCOMYCIN 1 SENSITIVE Sensitive     GENTAMICIN SYNERGY SENSITIVE Sensitive     * MODERATE ENTEROCOCCUS FAECALIS  Novel Coronavirus, NAA (hospital order; send-out to ref lab)     Status: None   Collection Time: 12/01/18  1:15 PM   Specimen: Nasopharyngeal Swab; Respiratory  Result Value Ref Range Status   SARS-CoV-2, NAA NOT DETECTED NOT DETECTED Final    Comment: (NOTE) This test was developed and its performance characteristics determined by [employer]. This test has not been FDA cleared or approved. This test has been authorized by FDA under an Emergency Use Authorization (EUA). This test is only authorized for the duration of time the declaration that circumstances exist justifying the authorization of the emergency use of in vitro diagnostic tests for detection of SARS-CoV-2 virus and/or diagnosis of COVID-19 infection under section 564(b)(1) of the Act, 21 U.S.C. 509TOI-7(T)(2), unless the authorization is terminated or revoked sooner. When  diagnostic testing is negative, the possibility of a false negative result should be considered in the context of a patient's recent exposures and the presence of clinical signs and symptoms consistent with COVID-19. An individual without symptoms of COVID-19 and who is not shedding SARS-CoV-2 virus would expect to have a negative (not detected) result in this assay. Performed  At: [facility] [address] [place], [place] [id number] Rush Farmer MD AS:[id number]    [place]  Final    Comment: Performed at [facility], [place] [address], [place], [place] [address]      Radiology Studies: Dg Chest Port 1 View  Result Date: 12/03/2018 CLINICAL DATA:  Leukocytosis,weak EXAM: PORTABLE CHEST 1 VIEW COMPARISON:  11/20/2018 FINDINGS: The heart is enlarged. Shallow lung inflation. Stable elevation of RIGHT hemidiaphragm. Thoracic aorta is tortuous and partially calcified. There is dense calcification of the mitral annulus. There is minimal bibasilar atelectasis. No focal consolidations, pleural effusions, or pulmonary edema. IMPRESSION: 1. Cardiomegaly. 2. Bibasilar atelectasis. 3. Lungs otherwise clear. Electronically Signed   By: Nolon Nations M.D.   On: 12/03/2018 09:31     Scheduled Meds: . amiodarone  200 mg Oral Daily  . amoxicillin-clavulanate  1 tablet Oral BID WC  . apixaban  2.5 mg Oral BID  . atorvastatin  10 mg Oral Daily  . carvedilol  3.125 mg Oral BID  . cephALEXin  500 mg Oral Q12H  . feeding supplement (ENSURE ENLIVE)  237 mL Oral TID BM  . feeding supplement (PRO-STAT SUGAR FREE 64)  30 mL Oral BID WC  . furosemide  20 mg Oral Daily  . latanoprost  1 drop Both Eyes QHS  . mouth rinse  15 mL Mouth Rinse BID  . mirtazapine  15 mg Oral QHS  . multivitamin  15 mL Oral Daily  . potassium chloride  10 mEq Oral Daily  . predniSONE  10 mg Oral Q breakfast  . sodium chloride flush  10-40 mL Intracatheter Q12H  . sodium chloride  flush  5 mL Intracatheter Q8H   Continuous Infusions: . sodium chloride Stopped (11/30/18 2301)     LOS: 13 days   Time Spent in minutes   30 minutes  Fain Francis D.O. on 12/03/2018 at 9:45 AM  Between 7am to 7pm - Please see pager noted on [email or web address]  After 7pm go to [email or web address]  And look for the night coverage person covering for me after hours  [facility] Office  [phone]

## 2018-12-03 NOTE — Progress Notes (Signed)
Patient ID: Heather Benson, female   DOB: Jun 28, 1931, 85 y.o.   MRN: [id number]       Subjective: Sleeping upon my arrival.  Doesn't eat much.    Objective: Vital signs in last 24 hours: Temp:  [98.2 F (36.8 C)-99.9 F (37.7 C)] 99.9 F (37.7 C) (07/23 0634) Pulse Rate:  [66-76] 66 (07/23 0634) Resp:  [16] 16 (07/22 1530) BP: (111-144)/(51-61) 144/55 (07/23 0634) SpO2:  [96 %-100 %] 100 % (07/23 0634) Weight:  [65.3 kg] 65.3 kg (07/23 0631) Last BM Date: 12/02/18  Intake/Output from previous day: 07/22 0701 - 07/23 0700 In: 30 [I.V.:20] Out: 129.5 [Urine:100; Drains:29.5] Intake/Output this shift: No intake/output data recorded.  PE: Abd: soft, seems more tender today in LLQ, +BS, JP with minimal output but what is present is feculent.    Lab Results:  Recent Labs    12/01/18 0523 12/03/18 0500  WBC 12.6* 17.4*  HGB 12.4 12.1  HCT 41.7 40.6  PLT 230 241   BMET Recent Labs    12/01/18 0424 12/02/18 0346  NA 141 141  K 4.7 4.7  CL 102 101  CO2 31 32  GLUCOSE 108* 94  BUN 20 22  CREATININE 1.63* 1.84*  CALCIUM 8.8* 9.0   PT/INR No results for input(s): LABPROT, INR in the last 72 hours. CMP     Component Value Date/Time   NA 141 12/02/2018 0346   NA 144 11/20/2017 1211   K 4.7 12/02/2018 0346   CL 101 12/02/2018 0346   CO2 32 12/02/2018 0346   GLUCOSE 94 12/02/2018 0346   BUN 22 12/02/2018 0346   BUN 31 (H) 11/20/2017 1211   CREATININE 1.84 (H) 12/02/2018 0346   CREATININE 2.03 (H) 02/27/2015 1051   CALCIUM 9.0 12/02/2018 0346   PROT 5.3 (L) 11/21/2018 0517   PROT 7.0 11/20/2017 1211   ALBUMIN 2.1 (L) 11/29/2018 1449   ALBUMIN 3.8 11/20/2017 1211   AST 24 11/21/2018 0517   ALT 15 11/21/2018 0517   ALKPHOS 103 11/21/2018 0517   BILITOT 1.2 11/21/2018 0517   BILITOT 1.0 11/20/2017 1211   GFRNONAA 25 (L) 12/02/2018 0346   GFRAA 28 (L) 12/02/2018 0346   Lipase     Component Value Date/Time   LIPASE 27 12/11/2016 1335        Studies/Results: No results found.  Anti-infectives: Anti-infectives (From admission, onward)   Start     Dose/Rate Route Frequency Ordered Stop   11/30/18 1700  amoxicillin-clavulanate (AUGMENTIN) 500-125 MG per tablet 500 mg     1 tablet Oral 2 times daily with meals 11/30/18 1553 12/14/18 1659   11/30/18 1200  amoxicillin (AMOXIL) capsule 500 mg  Status:  Discontinued     500 mg Oral Every 12 hours 11/30/18 1057 11/30/18 1553   11/30/18 1200  cephALEXin (KEFLEX) capsule 500 mg     500 mg Oral Every 12 hours 11/30/18 1057 12/14/18 0959   11/28/18 1000  piperacillin-tazobactam (ZOSYN) IVPB 3.375 g  Status:  Discontinued     3.375 g 12.5 mL/hr over 240 Minutes Intravenous Every 8 hours 11/28/18 0922 11/30/18 1057   11/24/18 1800  ciprofloxacin (CIPRO) IVPB 400 mg  Status:  Discontinued     400 mg 200 mL/hr over 60 Minutes Intravenous Every 24 hours 11/24/18 1516 11/28/18 0922   11/24/18 1800  metroNIDAZOLE (FLAGYL) IVPB 500 mg  Status:  Discontinued     500 mg 100 mL/hr over 60 Minutes Intravenous Every 8 hours 11/24/18 1516 11/28/18  9038   11/21/18 2100  cefTRIAXone (ROCEPHIN) 2 g in sodium chloride 0.9 % 100 mL IVPB  Status:  Discontinued     2 g 200 mL/hr over 30 Minutes Intravenous Every 24 hours 11/21/18 0421 11/24/18 1602   11/21/18 2100  azithromycin (ZITHROMAX) 500 mg in sodium chloride 0.9 % 250 mL IVPB  Status:  Discontinued     500 mg 250 mL/hr over 60 Minutes Intravenous Every 24 hours 11/21/18 0421 11/24/18 1602   11/20/18 2215  cefTRIAXone (ROCEPHIN) 1 g in sodium chloride 0.9 % 100 mL IVPB     1 g 200 mL/hr over 30 Minutes Intravenous  Once 11/20/18 2204 11/20/18 2321   11/20/18 2215  azithromycin (ZITHROMAX) 500 mg in sodium chloride 0.9 % 250 mL IVPB     500 mg 250 mL/hr over 60 Minutes Intravenous  Once 11/20/18 2204 11/20/18 2352       Assessment/Plan Diverticular abscesswith suspected fistula (will need fistulagram to confirm) -s/p perc drain placement by  IR last week -on solid diet, but barely eating -WBC increased today to 17 and with more abdominal pain. Will order CT scan to further evaluate process -agree with palliative care involvement.  Unsure how well patient will do overall as she doesn't seem to have the determination to get better via mobilization or eating. -cont to follow -cont abx therapy   FEN -heart healthy diet VTE -Eliquis ID -augmentin   LOS: 13 days    [name] , [facility] 12/03/2018, 8:58 AM Pager: [phone]

## 2018-12-04 ENCOUNTER — Inpatient Hospital Stay (HOSPITAL_COMMUNITY): Payer: Medicare Other

## 2018-12-04 DIAGNOSIS — R627 Adult failure to thrive: Secondary | ICD-10-CM

## 2018-12-04 DIAGNOSIS — D72829 Elevated white blood cell count, unspecified: Secondary | ICD-10-CM

## 2018-12-04 LAB — CBC
HCT: 41.1 % (ref 36.0–46.0)
Hemoglobin: 12.4 g/dL (ref 12.0–15.0)
MCH: 27.9 pg (ref 26.0–34.0)
MCHC: 30.2 g/dL (ref 30.0–36.0)
MCV: 92.4 fL (ref 80.0–100.0)
Platelets: 277 10*3/uL (ref 150–400)
RBC: 4.45 MIL/uL (ref 3.87–5.11)
RDW: 17.1 % — ABNORMAL HIGH (ref 11.5–15.5)
WBC: 19.7 10*3/uL — ABNORMAL HIGH (ref 4.0–10.5)
nRBC: 0 % (ref 0.0–0.2)

## 2018-12-04 LAB — BASIC METABOLIC PANEL
Anion gap: 11 (ref 5–15)
BUN: 24 mg/dL — ABNORMAL HIGH (ref 8–23)
CO2: 28 mmol/L (ref 22–32)
Calcium: 8.9 mg/dL (ref 8.9–10.3)
Chloride: 96 mmol/L — ABNORMAL LOW (ref 98–111)
Creatinine, Ser: 1.37 mg/dL — ABNORMAL HIGH (ref 0.44–1.00)
GFR calc Af Amer: 41 mL/min — ABNORMAL LOW (ref >60)
GFR calc non Af Amer: 35 mL/min — ABNORMAL LOW (ref >60)
Glucose, Bld: 95 mg/dL (ref 70–99)
Potassium: 5 mmol/L (ref 3.5–5.1)
Sodium: 135 mmol/L (ref 135–145)

## 2018-12-04 LAB — APTT: aPTT: 75 seconds — ABNORMAL HIGH (ref 24–36)

## 2018-12-04 LAB — HEPARIN LEVEL (UNFRACTIONATED): Heparin Unfractionated: 1.28 IU/mL — ABNORMAL HIGH (ref 0.30–0.70)

## 2018-12-04 MED ORDER — VANCOMYCIN HCL 10 G IV SOLR
1250.0000 mg | INTRAVENOUS | Status: DC
  Filled 2018-12-04: qty 1250

## 2018-12-04 MED ORDER — MORPHINE SULFATE (CONCENTRATE) 10 MG/0.5ML PO SOLN: 5.0000 mg | ORAL | Status: DC | PRN

## 2018-12-04 MED ORDER — PIPERACILLIN-TAZOBACTAM 3.375 G IVPB: 3.3750 g | Freq: Three times a day (TID) | INTRAVENOUS | Status: DC

## 2018-12-04 MED ORDER — METHYLPREDNISOLONE SODIUM SUCC 40 MG IJ SOLR: 40.0000 mg | Freq: Every day | INTRAMUSCULAR | Status: DC

## 2018-12-04 MED ORDER — MORPHINE SULFATE (PF) 2 MG/ML IV SOLN: 2.0000 mg | INTRAVENOUS | Status: DC | PRN

## 2018-12-04 MED ORDER — LORAZEPAM 1 MG PO TABS: 1.0000 mg | ORAL_TABLET | ORAL | Status: DC | PRN

## 2018-12-04 NOTE — Telephone Encounter (Signed)
Refill not appropriate at this time.  Pt currently in hospital

## 2018-12-04 NOTE — TOC Progression Note (Signed)
Transition of Care [facility]) - Progression Note    Patient Details  Name: KECHIA YAHNKE MRN: [id number] Date of Birth: Jan 25, 1932  [facility] CM/SW [place], LCSW Phone Number: 12/04/2018, 3:33 PM  Clinical Narrative:    CSW received consult regarding setting up hospice services at home. [facility] reviewing referral and contacting family. Will need oxygen delivery to home prior to discharge home.    Expected Discharge Plan: [place] Barriers to Discharge: Continued Medical Work up  Expected Discharge Plan and Services Expected Discharge Plan: [place] In-house Referral: Clinical Social Work Discharge Planning Services: CM Consult Post Acute Care Choice: [place] arrangements for the past 2 months: Single Family Home                 DME Arranged: N/A(owns Air traffic controller) DME Agency: NA       HH Arranged: NA HH Agency: NA         Social Determinants of Health (SDOH) Interventions    Readmission Risk Interventions Readmission Risk Prevention Plan 11/24/2018 11/24/2018  Transportation Screening Complete Complete  PCP or Specialist Appt within 3-5 Days - Complete  HRI or [place] - Complete  Social Work Consult for [place] Planning/Counseling - Complete  Palliative Care Screening - Not Applicable  Medication Review [employer] photographer) - Complete  Some recent data might be hidden

## 2018-12-04 NOTE — Progress Notes (Addendum)
PROGRESS NOTE    Heather Benson  GEZ:[id number] DOB: November 01, 1931 DOA: 11/20/2018 PCP: [name], MD   Brief Narrative:  HPI on 11/21/2018 by Dr. Gean Birchwood Heather Benson is a 85 y.o. female with history of chronic systolic heart failure paroxysmal atrial fibrillation CAD hypertension hyperlipidemia with previous vertebroplasty's has been experiencing increasing back pain over the last 2 weeks.  Patient is supposed to see orthopedic but since patient pain is worsening came to the ER.  Patient has been also noted that patient has been getting more short of breath last few days.  Interim history Patient admitted with diverticular abscess and back pain.  Interventional radiology was consulted and drain was placed.  Abscess culture grew Enterococcus faecalis and E. coli both sensitive to IV Zosyn.  Infectious disease recommended amoxicillin for enterococcus and Keflex for E. coli.  Transition to oral antibiotics on 11/30/2018.  PT OT also recommending SNF. Assessment & Plan   Diverticulitis with diverticular abscess -Status post percutaneous drain by interventional radiology -Abscess culture grew Enterococcus faecalis and E. coli both sensitive to Zosyn but no common oral agent -Transitioned to Augmentin and Keflex per ID recommendation.  Antibiotic course depends on duration of drain. -Okay to discharge with drain in place per surgery.  Follow-up with Dr. Alvino Blood in 3 weeks. -IR recommends flushing drain daily with 10 cc sterile saline and outpatient follow-up in 7 to 10 days. -IR to arrange outpatient follow-up. -PT/OT recommended SNF.  Patient now considering SNF close to her home -Palliative care consulted -Inpatient rehab also consulted with feels that patient would not be a candidate due to poor participation and tolerance -Repeat CT abdomen pelvis on 12/03/2018 showed sigmoid diverticulosis.  CT-guided drain catheter placement with catheter positioned in the collection  which is unchanged 3.5 cm -Leukocytosis continues to worsen, up to 19.7 today. -will discuss with IR  -Will obtain repeat UA/Culture -given low grade fever, will transition to IV antibiotics, zosyn and vancomycin  Leukocytosis -Likely due to the above -As above  Back pain likely due to compression fraction at T12/L5 -IR suggests vertebroplasty when diverticular abscess is controlled -Pain fairly controlled  Paroxysmal atrial fibrillation -Currently in sinus rhythm, continue amiodarone, Coreg -Will discontinue Eliquis 2.5 mg BID and place on heparin, in case of any upcoming procedures  Chronic diastolic CHF -Currently stable and euvolemic -Continue home Lasix  Acute kidney injury on chronic kidney disease, stage IV -AKI resolved -Baseline creatinine appears to be 1.6-1.8 upon review of patient's chart dating back to 2019  Anemia of chronic disease -Hemoglobin stable  Acute respiratory failure with hypoxia -Resolved  Goals of care  -Palliative care consulted and appreciated -currently DNR -Family planning to come at 1pm  Addendum: RN thought patient was different and had facial droop and weakness, along with tongue swelling. CODE Stroke called. CT head negative for acute intracranial abnormality.   DVT Prophylaxis  Eliquis--> Heparin  Code Status: DNR  Family Communication: None at bedside. Family scheduled to come at 1pm. Discussed with patient's husband via phone.  Disposition Plan: Admitted. Pending SNF  Consultants Interventional radiology Inpatient rehab Infectious disease, via phone by Dr. Cyndia Skeeters General surgery Palliative care  Procedures  CT-guided drainage of pelvic abscess, drain placement by interventional radiology  Antibiotics   Anti-infectives (From admission, onward)   Start     Dose/Rate Route Frequency Ordered Stop   11/30/18 1700  amoxicillin-clavulanate (AUGMENTIN) 500-125 MG per tablet 500 mg     1 tablet Oral 2 times daily with  meals 11/30/18 1553 12/14/18 1659   11/30/18 1200  amoxicillin (AMOXIL) capsule 500 mg  Status:  Discontinued     500 mg Oral Every 12 hours 11/30/18 1057 11/30/18 1553   11/30/18 1200  cephALEXin (KEFLEX) capsule 500 mg     500 mg Oral Every 12 hours 11/30/18 1057 12/14/18 0959   11/28/18 1000  piperacillin-tazobactam (ZOSYN) IVPB 3.375 g  Status:  Discontinued     3.375 g 12.5 mL/hr over 240 Minutes Intravenous Every 8 hours 11/28/18 0922 11/30/18 1057   11/24/18 1800  ciprofloxacin (CIPRO) IVPB 400 mg  Status:  Discontinued     400 mg 200 mL/hr over 60 Minutes Intravenous Every 24 hours 11/24/18 1516 11/28/18 0922   11/24/18 1800  metroNIDAZOLE (FLAGYL) IVPB 500 mg  Status:  Discontinued     500 mg 100 mL/hr over 60 Minutes Intravenous Every 8 hours 11/24/18 1516 11/28/18 0922   11/21/18 2100  cefTRIAXone (ROCEPHIN) 2 g in sodium chloride 0.9 % 100 mL IVPB  Status:  Discontinued     2 g 200 mL/hr over 30 Minutes Intravenous Every 24 hours 11/21/18 0421 11/24/18 1602   11/21/18 2100  azithromycin (ZITHROMAX) 500 mg in sodium chloride 0.9 % 250 mL IVPB  Status:  Discontinued     500 mg 250 mL/hr over 60 Minutes Intravenous Every 24 hours 11/21/18 0421 11/24/18 1602   11/20/18 2215  cefTRIAXone (ROCEPHIN) 1 g in sodium chloride 0.9 % 100 mL IVPB     1 g 200 mL/hr over 30 Minutes Intravenous  Once 11/20/18 2204 11/20/18 2321   11/20/18 2215  azithromycin (ZITHROMAX) 500 mg in sodium chloride 0.9 % 250 mL IVPB     500 mg 250 mL/hr over 60 Minutes Intravenous  Once 11/20/18 2204 11/20/18 2352      Subjective:   Heather Benson seen and examined today.  Not very interactive this morning. Complains of abdominal pain. Denies back pain at this time.   Objective:   Vitals:   12/03/18 0631 12/03/18 0634 12/03/18 0959 12/03/18 2321  BP:  (!) 144/55 (!) 144/54 (!) 146/72  Pulse:  66 66 78  Resp:    18  Temp:  99.9 F (37.7 C)  99.5 F (37.5 C)  TempSrc:    Oral  SpO2:  100%  98%    Weight: 65.3 kg     Height:        Intake/Output Summary (Last 24 hours) at 12/04/2018 0845 Last data filed at 12/04/2018 0653 Gross per 24 hour  Intake 454 ml  Output 20 ml  Net 434 ml   Filed Weights   12/01/18 0615 12/02/18 0500 12/03/18 0631  Weight: 64.9 kg 66.7 kg 65.3 kg   Exam  General: Well developed, chronically ill appearing, NAD  HEENT: NCAT, mucous membranes moist.   Neck: Supple, no JVD, no masses  Cardiovascular: S1 S2 auscultated, RRR  Respiratory: Clear to auscultation bilaterally   Abdomen: Soft, TTP (LLQ), nondistended, +BS, JP drain with minimal output  Extremities: warm dry without cyanosis clubbing or edema  Neuro: AAOx 2, nonfocal  Data Reviewed: I have personally reviewed following labs and imaging studies  CBC: Recent Labs  Lab 11/29/18 1449  11/30/18 0254 11/30/18 1156 12/01/18 0523 12/03/18 0500 12/04/18 0524  WBC 12.6*  --   --   --  12.6* 17.4* 19.7*  NEUTROABS 10.5*  --   --   --   --   --   --   HGB 12.7   < >  12.1 12.3 12.4 12.1 12.4  HCT 43.2   < > 40.4 41.7 41.7 40.6 41.1  MCV 94.1  --   --   --  94.3 93.3 92.4  PLT 237  --   --   --  230 241 277   < > = values in this interval not displayed.   Basic Metabolic Panel: Recent Labs  Lab 11/29/18 1449 12/01/18 0424 12/02/18 0346 12/03/18 0918 12/04/18 0524  NA 139 141 141 138 135  K 5.2* 4.7 4.7 5.0 5.0  CL 101 102 101 100 96*  CO2 30 31 32 29 28  GLUCOSE 117* 108* 94 110* 95  BUN 15 20 22  27* 24*  CREATININE 1.74* 1.63* 1.84* 1.52* 1.37*  CALCIUM 9.0 8.8* 9.0 8.6* 8.9  MG 2.0 2.2 2.2  --   --   PHOS 3.1  --   --   --   --    GFR: Estimated Creatinine Clearance: 28.7 mL/min (A) (by C-G formula based on SCr of 1.37 mg/dL (H)). Liver Function Tests: Recent Labs  Lab 11/29/18 1449  ALBUMIN 2.1*   No results for input(s): LIPASE, AMYLASE in the last 168 hours. No results for input(s): AMMONIA in the last 168 hours. Coagulation Profile: No results for input(s):  INR, PROTIME in the last 168 hours. Cardiac Enzymes: No results for input(s): CKTOTAL, CKMB, CKMBINDEX, TROPONINI in the last 168 hours. BNP (last 3 results) No results for input(s): PROBNP in the last 8760 hours. HbA1C: No results for input(s): HGBA1C in the last 72 hours. CBG: No results for input(s): GLUCAP in the last 168 hours. Lipid Profile: No results for input(s): CHOL, HDL, LDLCALC, TRIG, CHOLHDL, LDLDIRECT in the last 72 hours. Thyroid Function Tests: No results for input(s): TSH, T4TOTAL, FREET4, T3FREE, THYROIDAB in the last 72 hours. Anemia Panel: No results for input(s): VITAMINB12, FOLATE, FERRITIN, TIBC, IRON, RETICCTPCT in the last 72 hours. Urine analysis:    Component Value Date/Time   COLORURINE YELLOW 11/20/2018 1421   APPEARANCEUR CLOUDY (A) 11/20/2018 1421   APPEARANCEUR Clear 07/17/2017 1354   LABSPEC 1.017 11/20/2018 1421   PHURINE 5.0 11/20/2018 1421   GLUCOSEU NEGATIVE 11/20/2018 1421   HGBUR LARGE (A) 11/20/2018 1421   BILIRUBINUR NEGATIVE 11/20/2018 1421   BILIRUBINUR Negative 07/17/2017 1354   KETONESUR 5 (A) 11/20/2018 1421   PROTEINUR NEGATIVE 11/20/2018 1421   UROBILINOGEN 0.2 02/05/2011 0915   NITRITE POSITIVE (A) 11/20/2018 1421   LEUKOCYTESUR LARGE (A) 11/20/2018 1421   Sepsis Labs: @LABRCNTIP (procalcitonin:4,lacticidven:4)  ) Recent Results (from the past 240 hour(s))  Surgical pcr screen     Status: Abnormal   Collection Time: 11/25/18 10:58 PM   Specimen: Nasal Mucosa; Nasal Swab  Result Value Ref Range Status   MRSA, PCR POSITIVE (A) NEGATIVE Final   Staphylococcus aureus POSITIVE (A) NEGATIVE Final    Comment: RESULT CALLED TO, READ BACK BY AND VERIFIED WITH: C ROBINSON RN 11/26/18 0125 JDW (NOTE) The Xpert SA Assay (FDA approved for NASAL specimens in patients 25 years of age and older), is one component of a comprehensive surveillance program. It is not intended to diagnose infection nor to guide or monitor  treatment. Performed at [facility], [place] [address] [place], [place] [address]   Aerobic/Anaerobic Culture (surgical/deep wound)     Status: None   Collection Time: 11/26/18  1:48 PM   Specimen: Abscess  Result Value Ref Range Status   Specimen Description ABSCESS PERICOLONIC  Final   Special Requests  NONE  Final   Gram Stain   Final    FEW WBC PRESENT, PREDOMINANTLY PMN FEW GRAM POSITIVE RODS FEW GRAM POSITIVE COCCI IN CLUSTERS    Culture   Final    MODERATE ENTEROCOCCUS FAECALIS FEW ESCHERICHIA COLI FEW BACTEROIDES FRAGILIS FEW BACTEROIDES OVATUS BETA LACTAMASE POSITIVE Performed at [facility], [place] [address], [place], [place] [address]    Report Status 12/01/2018 FINAL  Final   Organism ID, Bacteria ENTEROCOCCUS FAECALIS  Final   Organism ID, Bacteria ESCHERICHIA COLI  Final      Susceptibility   Escherichia coli - MIC*    AMPICILLIN >=32 RESISTANT Resistant     CEFAZOLIN <=4 SENSITIVE Sensitive     CEFEPIME <=1 SENSITIVE Sensitive     CEFTAZIDIME <=1 SENSITIVE Sensitive     CEFTRIAXONE <=1 SENSITIVE Sensitive     CIPROFLOXACIN >=4 RESISTANT Resistant     GENTAMICIN <=1 SENSITIVE Sensitive     IMIPENEM <=0.25 SENSITIVE Sensitive     TRIMETH/SULFA <=20 SENSITIVE Sensitive     AMPICILLIN/SULBACTAM >=32 RESISTANT Resistant     PIP/TAZO <=4 SENSITIVE Sensitive     Extended ESBL NEGATIVE Sensitive     * FEW ESCHERICHIA COLI   Enterococcus faecalis - MIC*    AMPICILLIN <=2 SENSITIVE Sensitive     VANCOMYCIN 1 SENSITIVE Sensitive     GENTAMICIN SYNERGY SENSITIVE Sensitive     * MODERATE ENTEROCOCCUS FAECALIS  Novel Coronavirus, NAA (hospital order; send-out to ref lab)     Status: None   Collection Time: 12/01/18  1:15 PM   Specimen: Nasopharyngeal Swab; Respiratory  Result Value Ref Range Status   SARS-CoV-2, NAA NOT DETECTED NOT DETECTED Final    Comment: (NOTE) This test was developed and its performance characteristics determined by [employer]. This test has not been FDA cleared or approved. This test has been authorized by FDA under an Emergency Use Authorization (EUA). This test is only authorized for the duration of time the declaration that circumstances exist justifying the authorization of the emergency use of in vitro diagnostic tests for detection of SARS-CoV-2 virus and/or diagnosis of COVID-19 infection under section 564(b)(1) of the Act, 21 U.S.C. 191YNW-2(N)(5), unless the authorization is terminated or revoked sooner. When diagnostic testing is negative, the possibility of a false negative result should be considered in the context of a patient's recent exposures and the presence of clinical signs and symptoms consistent with COVID-19. An individual without symptoms of COVID-19 and who is not shedding SARS-CoV-2 virus would expect to have a negative (not detected) result in this assay. Performed  At: [facility] [address] [place], [place] [id number] Rush Farmer MD QI:[id number]    [place]  Final    Comment: Performed at [facility], [place] [address] [place], [place] [address]      Radiology Studies: Ct Abdomen Pelvis W Contrast  Result Date: 12/03/2018 CLINICAL DATA:  Follow-up diverticulitis EXAM: CT ABDOMEN AND PELVIS WITH CONTRAST TECHNIQUE: Multidetector CT imaging of the abdomen and pelvis was performed using the standard protocol following bolus administration of intravenous contrast. CONTRAST:  40mL ISOVUE-300 IOPAMIDOL (ISOVUE-300) INJECTION 61%, additional oral enteric contrast COMPARISON:  11/25/2018, CT-guided drain placement, 11/25/2018 FINDINGS: Lower chest: Bibasilar atelectasis or consolidation, similar compared to prior examination. Cardiomegaly, coronary artery disease, and dense mitral annular calcifications Hepatobiliary: No solid liver abnormality is seen. No gallstones, gallbladder wall thickening, or biliary dilatation. Pancreas:  Unremarkable. No pancreatic ductal dilatation or surrounding inflammatory changes. Spleen:  Normal in size without significant abnormality. Adrenals/Urinary Tract: Adrenal glands are unremarkable. Large, exophytic right renal cyst. Kidneys are otherwise normal, without renal calculi, solid lesion, or hydronephrosis. Bladder is unremarkable. Stomach/Bowel: Stomach is within normal limits. Redemonstrated sigmoid diverticulosis with wall thickening of the mid sigmoid and an air and fluid collection adjacent to the left aspect of the colon. There has been interval CT-guided drain placement with left anterior approach pigtail drainage catheter position within the collection, which is not significantly changed in size, measuring 3.5 cm. Vascular/Lymphatic: Aortic atherosclerosis. No enlarged abdominal or pelvic lymph nodes. Reproductive: Status post hysterectomy. Other: No abdominal wall hernia or abnormality. No abdominopelvic ascites. Musculoskeletal: No acute or significant osseous findings. IMPRESSION: 1. Redemonstrated sigmoid diverticulosis with wall thickening of the mid sigmoid and an air and fluid collection adjacent to the left aspect of the colon. There has been interval CT-guided drain placement with left anterior approach pigtail drainage catheter position within the collection, which is not significantly changed in size, measuring 3.5 cm. 2. Other chronic, incidental, and postoperative findings as detailed above. Electronically Signed   By: Eddie Candle M.D.   On: 12/03/2018 14:20   Dg Chest Port 1 View  Result Date: 12/03/2018 CLINICAL DATA:  Leukocytosis,weak EXAM: PORTABLE CHEST 1 VIEW COMPARISON:  11/20/2018 FINDINGS: The heart is enlarged. Shallow lung inflation. Stable elevation of RIGHT hemidiaphragm. Thoracic aorta is tortuous and partially calcified. There is dense calcification of the mitral annulus. There is minimal bibasilar atelectasis. No focal consolidations, pleural effusions, or pulmonary  edema. IMPRESSION: 1. Cardiomegaly. 2. Bibasilar atelectasis. 3. Lungs otherwise clear. Electronically Signed   By: Nolon Nations M.D.   On: 12/03/2018 09:31     Scheduled Meds:  amiodarone  200 mg Oral Daily   amoxicillin-clavulanate  1 tablet Oral BID WC   atorvastatin  10 mg Oral Daily   carvedilol  3.125 mg Oral BID   cephALEXin  500 mg Oral Q12H   feeding supplement (ENSURE ENLIVE)  237 mL Oral TID BM   feeding supplement (PRO-STAT SUGAR FREE 64)  30 mL Oral BID WC   furosemide  20 mg Oral Daily   latanoprost  1 drop Both Eyes QHS   mouth rinse  15 mL Mouth Rinse BID   mirtazapine  15 mg Oral QHS   multivitamin  15 mL Oral Daily   potassium chloride  10 mEq Oral Daily   predniSONE  10 mg Oral Q breakfast   sodium chloride flush  10-40 mL Intracatheter Q12H   sodium chloride flush  5 mL Intracatheter Q8H   Continuous Infusions:  sodium chloride Stopped (11/30/18 2301)   heparin 950 Units/hr (12/03/18 2022)     LOS: 14 days   Time Spent in minutes   45 minutes  Tyger Oka D.O. on 12/04/2018 at 8:45 AM  Between 7am to 7pm - Please see pager noted on [email or web address]  After 7pm go to [email or web address]  And look for the night coverage person covering for me after hours  [facility] Office  [phone]

## 2018-12-04 NOTE — Progress Notes (Addendum)
Manufacturing engineer [facility]) Hospital Liaison: RN Note   Notified by [facility] of patient/family request for [employer] services at home after discharge. Chart and patient information under review by [facility] physician. Hospice eligibility pending at this time.   Writer spoke with son Merry Proud via telephone to initiate education related to hospice philosophy, services and team approach to care.  Patient son Merry Proud verbalized understanding of information given. Per discussion, plan is for discharge to home by [facility] tomorrow.   Please send signed and completed DNR form home with patient/family. Patient will need prescriptions for discharge comfort medications.   DME needs have been discussed, patient currently has the following equipment in the home: a walker and cane.  Patient/family requests the following DME for delivery to the home: a full rail hospital bed with bedside table and oxygen tanks/set up for 2L O2 via nasal cannula. [place] equipment manager has been notified and will contact [employer] to arrange delivery to the home. Home address has been verified and is correct in the chart.  Merry Proud (son) is the family member to contact to arrange time of delivery.   [facility] aware of the above. Please notify [facility] when patient is ready to leave the unit at discharge. (Call [phone] or [phone] after 5pm.) [facility] information and contact numbers given to son Merry Proud at time of visit. Above information shared with [facility] covering [place].   Please call with any hospice related questions.   Thank you for this referral,   Gar Ponto, RN [facility] Liaison (now found on [place]) [phone]   Update: Pt is hospice eligible per the [facility] MD (Dr. Konrad Dolores)....have made son Merry Proud and [place] aware.

## 2018-12-04 NOTE — Significant Event (Signed)
Rapid Response Event Note  Overview: Time Called: 0900 Arrival Time: 0903 Event Type: Neurologic  Initial Focused Assessment: Per RN "new" facial droop.  Upon my assessment patient with "right facial droop" at rest.  She does not make the effort to smile or grimace.  Initially not talking to staff but after a few minutes answering a few questions and  Verbally reacting to staff.  Generalized weakness, drift blat UE and minimal movement of legs. Patient complains of pain in her legs, back and abdomen.  She feel warm to the touch  Rectal temp 100.3  Dr Ree Kida at bedside to assess patient, states patient with baseline facial droop at rest.  Saverio Danker PA also at bedside to assess patient.  Patient with Left quadrant drain, very tender around drain.  146/63  HR 78  RR 20  O2 sat 97% on RA    Interventions: CT head done  Plan of Care (if not transferred): Family conference scheduled at [place] to call if assistance needed   Event Summary: Name of Physician Notified: Velta Addison Mikhail at 0900    at    Outcome: Stayed in room and stabalized  Event End Time: 0920  Raliegh Ip

## 2018-12-04 NOTE — Progress Notes (Signed)
Patient ID: Heather Benson, female   DOB: 12/14/1931, 85 y.o.   MRN: [id number]       Subjective: Arrived this morning with possible code stroke in place.  Patient doesn't want to speak much this morning.  Complaining of horrible abdominal pain around her drain.  Isn't eating.    Objective: Vital signs in last 24 hours: Temp:  [99.4 F (37.4 C)-99.5 F (37.5 C)] 99.4 F (37.4 C) (07/24 0859) Pulse Rate:  [78] 78 (07/24 0859) Resp:  [18-20] 20 (07/24 0859) BP: (146)/(63-72) 146/63 (07/24 0859) SpO2:  [97 %-98 %] 97 % (07/24 0859) Last BM Date: 12/04/18  Intake/Output from previous day: 07/23 0701 - 07/24 0700 In: 454 [P.O.:444; I.V.:10] Out: 20 [Drains:20] Intake/Output this shift: No intake/output data recorded.  PE: Abd: soft, but very tender around her drain.  Drain with feculent drainage, about 20cc in last 24 hrs.  +BS Neurologic: nonfocal.  Most of this exam was done by primary team before my arrival  Lab Results:  Recent Labs    12/03/18 0500 12/04/18 0524  WBC 17.4* 19.7*  HGB 12.1 12.4  HCT 40.6 41.1  PLT 241 277   BMET Recent Labs    12/03/18 0918 12/04/18 0524  NA 138 135  K 5.0 5.0  CL 100 96*  CO2 29 28  GLUCOSE 110* 95  BUN 27* 24*  CREATININE 1.52* 1.37*  CALCIUM 8.6* 8.9   PT/INR No results for input(s): LABPROT, INR in the last 72 hours. CMP     Component Value Date/Time   NA 135 12/04/2018 0524   NA 144 11/20/2017 1211   K 5.0 12/04/2018 0524   CL 96 (L) 12/04/2018 0524   CO2 28 12/04/2018 0524   GLUCOSE 95 12/04/2018 0524   BUN 24 (H) 12/04/2018 0524   BUN 31 (H) 11/20/2017 1211   CREATININE 1.37 (H) 12/04/2018 0524   CREATININE 2.03 (H) 02/27/2015 1051   CALCIUM 8.9 12/04/2018 0524   PROT 5.3 (L) 11/21/2018 0517   PROT 7.0 11/20/2017 1211   ALBUMIN 2.1 (L) 11/29/2018 1449   ALBUMIN 3.8 11/20/2017 1211   AST 24 11/21/2018 0517   ALT 15 11/21/2018 0517   ALKPHOS 103 11/21/2018 0517   BILITOT 1.2 11/21/2018 0517   BILITOT  1.0 11/20/2017 1211   GFRNONAA 35 (L) 12/04/2018 0524   GFRAA 41 (L) 12/04/2018 0524   Lipase     Component Value Date/Time   LIPASE 27 12/11/2016 1335       Studies/Results: Ct Head Wo Contrast  Result Date: 12/04/2018 CLINICAL DATA:  Facial weakness EXAM: CT HEAD WITHOUT CONTRAST TECHNIQUE: Contiguous axial images were obtained from the base of the skull through the vertex without intravenous contrast. COMPARISON:  01/26/2018 FINDINGS: Brain: There is atrophy and chronic small vessel disease changes. Associated ventriculomegaly. No hemorrhage or acute infarction. No midline shift. Vascular: No hyperdense vessel or unexpected calcification. Skull: No acute calvarial abnormality. Sinuses/Orbits: No acute finding Other: None IMPRESSION: Atrophy, chronic microvascular disease. No acute intracranial abnormality. Electronically Signed   By: Rolm Baptise M.D.   On: 12/04/2018 09:45   Ct Abdomen Pelvis W Contrast  Result Date: 12/03/2018 CLINICAL DATA:  Follow-up diverticulitis EXAM: CT ABDOMEN AND PELVIS WITH CONTRAST TECHNIQUE: Multidetector CT imaging of the abdomen and pelvis was performed using the standard protocol following bolus administration of intravenous contrast. CONTRAST:  46mL ISOVUE-300 IOPAMIDOL (ISOVUE-300) INJECTION 61%, additional oral enteric contrast COMPARISON:  11/25/2018, CT-guided drain placement, 11/25/2018 FINDINGS: Lower chest: Bibasilar atelectasis  or consolidation, similar compared to prior examination. Cardiomegaly, coronary artery disease, and dense mitral annular calcifications Hepatobiliary: No solid liver abnormality is seen. No gallstones, gallbladder wall thickening, or biliary dilatation. Pancreas: Unremarkable. No pancreatic ductal dilatation or surrounding inflammatory changes. Spleen: Normal in size without significant abnormality. Adrenals/Urinary Tract: Adrenal glands are unremarkable. Large, exophytic right renal cyst. Kidneys are otherwise normal, without  renal calculi, solid lesion, or hydronephrosis. Bladder is unremarkable. Stomach/Bowel: Stomach is within normal limits. Redemonstrated sigmoid diverticulosis with wall thickening of the mid sigmoid and an air and fluid collection adjacent to the left aspect of the colon. There has been interval CT-guided drain placement with left anterior approach pigtail drainage catheter position within the collection, which is not significantly changed in size, measuring 3.5 cm. Vascular/Lymphatic: Aortic atherosclerosis. No enlarged abdominal or pelvic lymph nodes. Reproductive: Status post hysterectomy. Other: No abdominal wall hernia or abnormality. No abdominopelvic ascites. Musculoskeletal: No acute or significant osseous findings. IMPRESSION: 1. Redemonstrated sigmoid diverticulosis with wall thickening of the mid sigmoid and an air and fluid collection adjacent to the left aspect of the colon. There has been interval CT-guided drain placement with left anterior approach pigtail drainage catheter position within the collection, which is not significantly changed in size, measuring 3.5 cm. 2. Other chronic, incidental, and postoperative findings as detailed above. Electronically Signed   By: Eddie Candle M.D.   On: 12/03/2018 14:20   Dg Chest Port 1 View  Result Date: 12/03/2018 CLINICAL DATA:  Leukocytosis,weak EXAM: PORTABLE CHEST 1 VIEW COMPARISON:  11/20/2018 FINDINGS: The heart is enlarged. Shallow lung inflation. Stable elevation of RIGHT hemidiaphragm. Thoracic aorta is tortuous and partially calcified. There is dense calcification of the mitral annulus. There is minimal bibasilar atelectasis. No focal consolidations, pleural effusions, or pulmonary edema. IMPRESSION: 1. Cardiomegaly. 2. Bibasilar atelectasis. 3. Lungs otherwise clear. Electronically Signed   By: Nolon Nations M.D.   On: 12/03/2018 09:31    Anti-infectives: Anti-infectives (From admission, onward)   Start     Dose/Rate Route Frequency  Ordered Stop   11/30/18 1700  amoxicillin-clavulanate (AUGMENTIN) 500-125 MG per tablet 500 mg     1 tablet Oral 2 times daily with meals 11/30/18 1553 12/14/18 1659   11/30/18 1200  amoxicillin (AMOXIL) capsule 500 mg  Status:  Discontinued     500 mg Oral Every 12 hours 11/30/18 1057 11/30/18 1553   11/30/18 1200  cephALEXin (KEFLEX) capsule 500 mg     500 mg Oral Every 12 hours 11/30/18 1057 12/14/18 0959   11/28/18 1000  piperacillin-tazobactam (ZOSYN) IVPB 3.375 g  Status:  Discontinued     3.375 g 12.5 mL/hr over 240 Minutes Intravenous Every 8 hours 11/28/18 0922 11/30/18 1057   11/24/18 1800  ciprofloxacin (CIPRO) IVPB 400 mg  Status:  Discontinued     400 mg 200 mL/hr over 60 Minutes Intravenous Every 24 hours 11/24/18 1516 11/28/18 0922   11/24/18 1800  metroNIDAZOLE (FLAGYL) IVPB 500 mg  Status:  Discontinued     500 mg 100 mL/hr over 60 Minutes Intravenous Every 8 hours 11/24/18 1516 11/28/18 0922   11/21/18 2100  cefTRIAXone (ROCEPHIN) 2 g in sodium chloride 0.9 % 100 mL IVPB  Status:  Discontinued     2 g 200 mL/hr over 30 Minutes Intravenous Every 24 hours 11/21/18 0421 11/24/18 1602   11/21/18 2100  azithromycin (ZITHROMAX) 500 mg in sodium chloride 0.9 % 250 mL IVPB  Status:  Discontinued     500 mg 250 mL/hr over  60 Minutes Intravenous Every 24 hours 11/21/18 0421 11/24/18 1602   11/20/18 2215  cefTRIAXone (ROCEPHIN) 1 g in sodium chloride 0.9 % 100 mL IVPB     1 g 200 mL/hr over 30 Minutes Intravenous  Once 11/20/18 2204 11/20/18 2321   11/20/18 2215  azithromycin (ZITHROMAX) 500 mg in sodium chloride 0.9 % 250 mL IVPB     500 mg 250 mL/hr over 60 Minutes Intravenous  Once 11/20/18 2204 11/20/18 2352       Assessment/Plan Diverticular abscesswith suspected fistula(will need fistulagram to confirm) -s/p perc drain placement by IR last week -on solid diet, but barely eating -WBC increased today to 19 today.  CT yesterday with no new changes.  Collection still  about the same size.  Inflammatory changes on CT scan are not much different to explain her significant increase in pain and WBC -discussed with primary, to reinitiate IV abx therapy to see if this makes any difference -UA is pending to rule out UTI as source -agree with palliative care involvement.  Unsure how well patient will do overall as she doesn't seem to have the determination to get better via mobilization or eating. -cont to follow -CT head negative for stroke   FEN -NPO VTE -heparin ID -augmentin/Kelfex, will DC today and return to IV abx therapy   LOS: 14 days    [name] , [facility] Surgery 12/04/2018, 10:16 AM Pager: [phone]

## 2018-12-04 NOTE — Progress Notes (Signed)
RN received report from [employer]. She reported pt had some slight right facial dropping but with repositioning pt it helped pt be more alert and able to answer questions. (0715)  0900 - RN's NT (Sherelle) verbalized concern for pt including decreased response and increase facial droop & tongue swelling. RN went to assess pt. RN agreed with these findings along with weak left hand grip and pt unable to speak. Pt unable to state her name. All responses very delayed and garbled. Pt unable to follow pt's fingers with her eyes.   RN paged concerns to Ree Kida MD who stated she would come to assess pt and order head CT to check if any changes have occurred.   RN will continue to monitor pt post CT. (SWAT team took pt to head CT)

## 2018-12-04 NOTE — Progress Notes (Signed)
Patient ID: Heather Benson, female   DOB: 17-Oct-1931, 85 y.o.   MRN: [id number]  This NP visited patient at the bedside as a follow up for palliative medicine needs and emotional support.  Scheduled meeting with husband and son for continued conversation regarding GOCs and EOL wishes.  Conversation had regarding diagnosis, prognosis, GOC, EOL wishes disposition and options.  Concept of Hospice and hospice benefit  discussed  A detailed discussion was had today regarding advanced directives.  Concepts specific to code status, artifical feeding and hydration, continued IV antibiotics and rehospitalization was had.  The difference between a aggressive medical intervention path  and a palliative comfort care path for this patient at this time was had.  Values and goals of care important to patient and family were attempted to be elicited.   Plan of Care: -DNR/DNI -focus is comfort and dignity, allow for a natural death/no further life prolonging measures -no artifical feeding or hydration now or in the future - no further diagnostics, no labs, discontinue antibiotics -symptom management;  Roxanol/Ativan - transition home with hospice  Natural trajectory and expectations at EOL were discussed.  Questions and concerns addressed.   Family encouraged to call with questions or concerns.    Emotional support offered      [place] booklet left for review  Questions and concerns addressed  Discussed with Dr Ree Kida  Total time spent on the unit was 60  minutes    PMT will continue to support holistically  Greater than 50% of the time was spent in counseling and coordination of care  Wadie Lessen NP  Palliative Medicine Team Team Phone # [phone] Pager [phone]

## 2018-12-04 NOTE — Progress Notes (Signed)
Pharmacy Antibiotic Note  Heather Benson is a 85 y.o. female admitted on 11/20/2018 with a diverticular abscess.  Pharmacy has been consulted for vancomycin and zosyn dosing.  Plan: Start Vancomycin 1250 mg IV q48h Expected AUC 479.4 Scr used: 1.37 Obtain vanc levels as needed  Start Zosyn 3.375 gm q8h Monitor renal function, clinical improvement   Height: 5' 6.5" (168.9 cm) Weight: 144 lb (65.3 kg) IBW/kg (Calculated) : 60.45  Temp (24hrs), Avg:99.5 F (37.5 C), Min:98.9 F (37.2 C), Max:100.3 F (37.9 C)  Recent Labs  Lab 11/29/18 1449 12/01/18 0424 12/01/18 0523 12/02/18 0346 12/03/18 0500 12/03/18 0918 12/04/18 0524  WBC 12.6*  --  12.6*  --  17.4*  --  19.7*  CREATININE 1.74* 1.63*  --  1.84*  --  1.52* 1.37*    Estimated Creatinine Clearance: 28.7 mL/min (A) (by C-G formula based on SCr of 1.37 mg/dL (H)).    Allergies  Allergen Reactions  . Augmentin [Amoxicillin-Pot Clavulanate] Diarrhea and Nausea Only  . Boniva [Ibandronic Acid] Diarrhea  . Codeine Other (See Comments)    "Spaced out feeling" and Lightheadedness  . Hydromorphone Nausea And Vomiting  . Latex Rash    "Burns me"  . Lisinopril Cough  . Amoxicillin   . Clavulanic Acid   . Adhesive [Tape] Itching and Rash    Antimicrobials this admission: Azith 7/10>>> 7/14 CTX 7/10>>>7/14 Cipro 7/14>>7/18 Flagyl 7/14>>7/18 Zosyn 7/18>>7/20; 7/24 >> CHG/Bactroban 7/16>>(7/20) Cephalexin 7/20 >> 7/24 Augmentin 7/20 >> 7/24 Vancomycin 7/24 >>  Microbiology results: 7/10 blood x 2 - negative 7/10 urine cx: positive for Ecoli (R-Unasyn, Cipro) and Aerococcus - low colony count  7/15: surgical PCR: positive 7/16: pericolonic abscess: mod Enterococcus faecalis, few E coli, bacteroids  Thank you for allowing pharmacy to be a part of this patient's care.  Acey Lav, PharmD  PGY1 Pharmacy Resident [facility] [phone] 12/04/2018 11:51 AM

## 2018-12-04 NOTE — Progress Notes (Signed)
[place] for Heparin Indication: atrial fibrillation  Allergies  Allergen Reactions  . Augmentin [Amoxicillin-Pot Clavulanate] Diarrhea and Nausea Only  . Boniva [Ibandronic Acid] Diarrhea  . Codeine Other (See Comments)    "Spaced out feeling" and Lightheadedness  . Hydromorphone Nausea And Vomiting  . Latex Rash    "Burns me"  . Lisinopril Cough  . Amoxicillin   . Clavulanic Acid   . Adhesive [Tape] Itching and Rash    Patient Measurements: Height: 5' 6.5" (168.9 cm) Weight: 144 lb (65.3 kg) IBW/kg (Calculated) : 60.45 Heparin Dosing Weight: 65.3  Vital Signs: Temp: 99.5 F (37.5 C) (07/23 2321) Temp Source: Oral (07/23 2321) BP: 146/72 (07/23 2321) Pulse Rate: 78 (07/23 2321)  Labs: Recent Labs    12/02/18 0346 12/03/18 0500 12/03/18 0918 12/03/18 1904 12/04/18 0524  HGB  --  12.1  --   --  12.4  HCT  --  40.6  --   --  41.1  PLT  --  241  --   --  277  APTT  --   --   --  58* 75*  HEPARINUNFRC  --   --   --  1.92*  --   CREATININE 1.84*  --  1.52*  --  1.37*    Estimated Creatinine Clearance: 28.7 mL/min (A) (by C-G formula based on SCr of 1.37 mg/dL (H)).   Medical History: Past Medical History:  Diagnosis Date  . Aortic atherosclerosis ([place]) 12/25/2016  . Aortic regurgitation    a. mild-mod by echo 01/2018.  [name] Aortic stenosis, mild 12/14/2015  . Arthritis 08-28-11   right knee pain-surgery planned  . CAD (coronary artery disease)    a. by cath 2012. b. Elev troponin 01/2018 -> cath not pursued due to renal dysfunction.  . Cancer [facility]) 08-28-11   Melonoma-cheek(many Yrs ago) , recent bx. left  face lesion, past skin cancer lesions  . CAP (community acquired pneumonia) 12/13/2015  . Chronic combined systolic and diastolic heart failure (HCC)    prob ischemic CM // admx with NSTEMI in 9/19 in setting of pneumonia // Echo 9/19: EF 35-40 // LHC avoided due to CKD //   . Complication of anesthesia 08-28-11   in past  arrythmia-  cardiology has seen in past  . Dilated cardiomyopathy ([place]) 03/20/2018   admx with NSTEMI in setting of prob pneumonia in 9/19 >> prob ischemic CM // Echo 9/19:  EF 35-40, anteroseptal, anterior, inferior, apical hypokinesis consistent with ischemia in the distribution of the LAD, grade 1 diastolic dysfunction, mild aortic stenosis (mean 7, peak 13), mild aortic insufficiency, moderate MAC, mild to moderate MR, moderate to severe LAE, mild RAE, moderate TR, PASP 59 //   . GERD (gastroesophageal reflux disease) 08-28-11   tx. omeprazole  . Hypercholesterolemia   . Hypertension 08-28-11   tx. meds  . IBS (irritable bowel syndrome)   . Lymphedema   . Mild mitral regurgitation   . Neuromuscular disorder ([place]) 08-28-11   hx. Polio age 25-slight residual affects legs  . Osteoarthritis    back and left knee  . Osteoporosis   . PAF (paroxysmal atrial fibrillation) ([place])   . Pneumonia 12/2015   hospital x 3 days  . PONV (postoperative nausea and vomiting)   . Renal cyst, right   . Sarcoidosis   . SVT (supraventricular tachycardia) ([place])   . Swelling of extremity 08-28-11   bilateral lower extremities- mid calf down   Assessment: Pt  85 yo F with hx of Afib, on apixaban PTA. Was on Apixaban 2.5 mg PO bid but holding for possible surgery. Transition to heparin.   Repeat aPTT now therapeutic on higher rate of 950 units/hr, heparin level still altered by DOAC, CBC stable.  Goal of Therapy:  Heparin level 0.3-0.7 units/ml aPTT 66-102 seconds Monitor platelets by anticoagulation protocol: Yes   Plan:  -Continue heparin 950 units/hr -Daily aPTT, heparin level, and CBC   Arrie Senate, PharmD, BCPS Clinical Pharmacist Please check AMION for all [facility] Pharmacy numbers 12/04/2018

## 2018-12-04 NOTE — Progress Notes (Signed)
Referring Physician(s): Dr Jonny Ruiz  Supervising Physician: Corrie Mckusick  Patient Status:  [facility] - In-pt  Chief Complaint:  Diverticular abscess Drain placed 7/16 in IR  Subjective:  OP is 20-30 cc daily Flushing probably 15 cc in daily  CT yesterday: IMPRESSION: 1. Redemonstrated sigmoid diverticulosis with wall thickening of the mid sigmoid and an air and fluid collection adjacent to the left aspect of the colon. There has been interval CT-guided drain placement with left anterior approach pigtail drainage catheter position within the collection, which is not significantly changed in size, measuring 3.5 cm. 2. Other chronic, incidental, and postoperative findings as detailed Above.   Allergies: Augmentin [amoxicillin-pot clavulanate], Boniva [ibandronic acid], Codeine, Hydromorphone, Latex, Lisinopril, Amoxicillin, Clavulanic acid, and Adhesive [tape]  Medications: Prior to Admission medications   Medication Sig Start Date End Date Taking? Authorizing Provider  acetaminophen (TYLENOL) 500 MG tablet Take 1,000 mg by mouth every 8 (eight) hours as needed (for pain).    Yes [provider]  amiodarone (PACERONE) 200 MG tablet TAKE 1 TABLET BY MOUTH EVERY DAY Patient taking differently: Take 200 mg by mouth daily.  09/21/18  Yes Jettie Booze, MD  apixaban (ELIQUIS) 2.5 MG TABS tablet Take 1 tablet (2.5 mg total) by mouth 2 (two) times daily. PLEASE START ON 02/03/18 AT 9PM 02/03/18  Yes Bonnielee Haff, MD  carvedilol (COREG) 3.125 MG tablet Take 1 tablet (3.125 mg total) by mouth 2 (two) times daily. 06/30/18 11/20/18 Yes Dunn, Dayna N, PA-C  furosemide (LASIX) 20 MG tablet Take 1 tablet (20 mg total) by mouth daily. 02/03/18  Yes Bonnielee Haff, MD  HYDROcodone-acetaminophen (NORCO/VICODIN) 5-325 MG tablet Take 1 tablet by mouth 2 (two) times daily as needed for pain.   Yes [provider]  latanoprost (XALATAN) 0.005 % ophthalmic solution Place 1  drop into both eyes at bedtime. 08/11/14  Yes [provider]  mirtazapine (REMERON) 15 MG tablet Take 15 mg by mouth at bedtime.   Yes [provider]  nitroGLYCERIN (NITROSTAT) 0.4 MG SL tablet Place 1 tablet (0.4 mg total) under the tongue every 5 (five) minutes as needed for chest pain. 03/20/18 11/20/18 Yes Weaver, Scott T, PA-C  polyethylene glycol (MIRALAX / GLYCOLAX) packet Take 17 g by mouth daily as needed. 02/03/18  Yes Bonnielee Haff, MD  potassium chloride (K-DUR) 10 MEQ tablet Take 10 mEq by mouth daily.   Yes [provider]  predniSONE (DELTASONE) 10 MG tablet Take 10 mg by mouth daily with breakfast.   Yes [provider]  atorvastatin (LIPITOR) 10 MG tablet Take 1 tablet (10 mg total) by mouth daily. Please make annual appt for future refills. [phone]. 1st attempt. 12/02/18   Jettie Booze, MD  Respiratory Therapy Supplies (FLUTTER) DEVI Use as directed. 02/06/16   Marshell Garfinkel, MD     Vital Signs: BP (!) 143/61 (BP Location: Left Arm)    Pulse 72    Temp 98.9 F (37.2 C) (Oral)    Resp 20    Ht 5' 6.5" (1.689 m)    Wt 144 lb (65.3 kg)    SpO2 100%    BMI 22.89 kg/m   Physical Exam Vitals signs reviewed.  Skin:    General: Skin is warm and dry.     Comments: Site is clean and dry NT No sign of infection OP feculent 20 cc recorded yesterday 10 cc in JP    ENTEROCOCCUS FAECALIS    Organism ID, Bacteria ESCHERICHIA COLI  Imaging: Ct Head Wo Contrast  Result Date: 12/04/2018 CLINICAL DATA:  Facial weakness EXAM: CT HEAD WITHOUT CONTRAST TECHNIQUE: Contiguous axial images were obtained from the base of the skull through the vertex without intravenous contrast. COMPARISON:  01/26/2018 FINDINGS: Brain: There is atrophy and chronic small vessel disease changes. Associated ventriculomegaly. No hemorrhage or acute infarction. No midline shift. Vascular: No hyperdense vessel or unexpected calcification. Skull: No acute  calvarial abnormality. Sinuses/Orbits: No acute finding Other: None IMPRESSION: Atrophy, chronic microvascular disease. No acute intracranial abnormality. Electronically Signed   By: Rolm Baptise M.D.   On: 12/04/2018 09:45   Ct Abdomen Pelvis W Contrast  Result Date: 12/03/2018 CLINICAL DATA:  Follow-up diverticulitis EXAM: CT ABDOMEN AND PELVIS WITH CONTRAST TECHNIQUE: Multidetector CT imaging of the abdomen and pelvis was performed using the standard protocol following bolus administration of intravenous contrast. CONTRAST:  67mL ISOVUE-300 IOPAMIDOL (ISOVUE-300) INJECTION 61%, additional oral enteric contrast COMPARISON:  11/25/2018, CT-guided drain placement, 11/25/2018 FINDINGS: Lower chest: Bibasilar atelectasis or consolidation, similar compared to prior examination. Cardiomegaly, coronary artery disease, and dense mitral annular calcifications Hepatobiliary: No solid liver abnormality is seen. No gallstones, gallbladder wall thickening, or biliary dilatation. Pancreas: Unremarkable. No pancreatic ductal dilatation or surrounding inflammatory changes. Spleen: Normal in size without significant abnormality. Adrenals/Urinary Tract: Adrenal glands are unremarkable. Large, exophytic right renal cyst. Kidneys are otherwise normal, without renal calculi, solid lesion, or hydronephrosis. Bladder is unremarkable. Stomach/Bowel: Stomach is within normal limits. Redemonstrated sigmoid diverticulosis with wall thickening of the mid sigmoid and an air and fluid collection adjacent to the left aspect of the colon. There has been interval CT-guided drain placement with left anterior approach pigtail drainage catheter position within the collection, which is not significantly changed in size, measuring 3.5 cm. Vascular/Lymphatic: Aortic atherosclerosis. No enlarged abdominal or pelvic lymph nodes. Reproductive: Status post hysterectomy. Other: No abdominal wall hernia or abnormality. No abdominopelvic ascites.  Musculoskeletal: No acute or significant osseous findings. IMPRESSION: 1. Redemonstrated sigmoid diverticulosis with wall thickening of the mid sigmoid and an air and fluid collection adjacent to the left aspect of the colon. There has been interval CT-guided drain placement with left anterior approach pigtail drainage catheter position within the collection, which is not significantly changed in size, measuring 3.5 cm. 2. Other chronic, incidental, and postoperative findings as detailed above. Electronically Signed   By: Eddie Candle M.D.   On: 12/03/2018 14:20   Dg Chest Port 1 View  Result Date: 12/03/2018 CLINICAL DATA:  Leukocytosis,weak EXAM: PORTABLE CHEST 1 VIEW COMPARISON:  11/20/2018 FINDINGS: The heart is enlarged. Shallow lung inflation. Stable elevation of RIGHT hemidiaphragm. Thoracic aorta is tortuous and partially calcified. There is dense calcification of the mitral annulus. There is minimal bibasilar atelectasis. No focal consolidations, pleural effusions, or pulmonary edema. IMPRESSION: 1. Cardiomegaly. 2. Bibasilar atelectasis. 3. Lungs otherwise clear. Electronically Signed   By: Nolon Nations M.D.   On: 12/03/2018 09:31    Labs:  CBC: Recent Labs    11/29/18 1449  11/30/18 1156 12/01/18 0523 12/03/18 0500 12/04/18 0524  WBC 12.6*  --   --  12.6* 17.4* 19.7*  HGB 12.7   < > 12.3 12.4 12.1 12.4  HCT 43.2   < > 41.7 41.7 40.6 41.1  PLT 237  --   --  230 241 277   < > = values in this interval not displayed.    COAGS: Recent Labs    01/26/18 1016  11/23/18 0337 11/24/18 0225 11/25/18 1203 12/03/18 1904 12/04/18  0524  INR 1.07  --   --   --  1.1  --   --   APTT 28   < > 71* 98*  --  58* 75*   < > = values in this interval not displayed.    BMP: Recent Labs    12/01/18 0424 12/02/18 0346 12/03/18 0918 12/04/18 0524  NA 141 141 138 135  K 4.7 4.7 5.0 5.0  CL 102 101 100 96*  CO2 31 32 29 28  GLUCOSE 108* 94 110* 95  BUN 20 22 27* 24*  CALCIUM 8.8*  9.0 8.6* 8.9  CREATININE 1.63* 1.84* 1.52* 1.37*  GFRNONAA 28* 25* 31* 35*  GFRAA 33* 28* 36* 41*    LIVER FUNCTION TESTS: Recent Labs    01/29/18 0458  09/24/18 1102 11/20/18 2216 11/21/18 0517 11/27/18 0418 11/29/18 1449  BILITOT 0.8  --  0.8 1.2 1.2  --   --   AST 28  --  38* 25 24  --   --   ALT 30  --  33 18 15  --   --   ALKPHOS 103  --  322* 101 103  --   --   PROT 4.6*  --  5.7* 5.8* 5.3*  --   --   ALBUMIN 2.1*   < > 3.1* 2.2* 2.1* 2.0* 2.1*   < > = values in this interval not displayed.    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

## 2018-12-05 MED ORDER — ONDANSETRON HCL 4 MG PO TABS: 4.0000 mg | ORAL_TABLET | Freq: Four times a day (QID) | ORAL | 0 refills | Status: AC | PRN

## 2018-12-05 MED ORDER — MORPHINE SULFATE (CONCENTRATE) 10 MG/0.5ML PO SOLN: 5.0000 mg | ORAL | 0 refills | Status: AC | PRN

## 2018-12-05 MED ORDER — LORAZEPAM 1 MG PO TABS: 1.0000 mg | ORAL_TABLET | ORAL | 0 refills | Status: AC | PRN

## 2018-12-05 NOTE — Care Management (Signed)
Notified by [facility] that bed and O2 are delivered. Spoke w son, Merry Proud, he confirmed and would like patient to be picked up at hospital by [facility] at 1pm. Address confirmed. [facility] request to pick up at 1pm completed. Forms including DNR on chart, meds sent to pharmacy by MD. No other CM needs.

## 2018-12-05 NOTE — Discharge Summary (Signed)
Physician Discharge Summary  Heather Benson MOQ:[id number] DOB: July 06, 1931 DOA: 11/20/2018  PCP: [name], MD  Admit date: 11/20/2018 Discharge date: 12/05/2018  Time spent: 45 minutes  Recommendations for Outpatient Follow-up:  Patient will be discharged to home with hospice.    Discharge Diagnoses:  Diverticulitis with diverticular abscess Leukocytosis Back pain likely due to compression fraction at T12/L5 Paroxysmal atrial fibrillation Chronic diastolic CHF Acute kidney injury on chronic kidney disease, stage IV Anemia of chronic disease Acute respiratory failure with hypoxia Goals of care   Discharge Condition: Stable  Diet recommendation: comfort  Filed Weights   12/01/18 0615 12/02/18 0500 12/03/18 0631  Weight: 64.9 kg 66.7 kg 65.3 kg    History of present illness:  on 11/21/2018 by Dr. Adalberto Ill a 85 y.o.femalewithhistory of chronic systolic heart failure paroxysmal atrial fibrillation CAD hypertension hyperlipidemia with previous vertebroplasty's has been experiencing increasing back pain over the last 2 weeks. Patient is supposed to see orthopedic but since patient pain is worsening came to the ER. Patient has been also noted that patient has been getting more short of breath last few days.  Hospital Course:  Diverticulitis with diverticular abscess -Status post percutaneous drain by interventional radiology -Abscess culture grew Enterococcus faecalis and E. coli both sensitive to Zosyn but no common oral agent -Transitionedto Augmentinand Keflex per ID recommendation. Antibiotic course depends on duration of drain. -Okay to discharge with drain in place per surgery. Follow-up with Dr. Alvino Blood in 3 weeks. -IR recommends flushing drain daily with 10 cc sterile saline and outpatient follow-up in 7 to 10 days. -IR to arrange outpatient follow-up. -PT/OT recommended SNF. Patient now considering SNF close to her  home -Palliative care consulted -Inpatient rehab also consulted with feels that patient would not be a candidate due to poor participation and tolerance -Repeat CT abdomen pelvis on 12/03/2018 showed sigmoid diverticulosis.  CT-guided drain catheter placement with catheter positioned in the collection which is unchanged 3.5 cm -Leukocytosis continues to worsen, up to 19.7  -placed back on vancomycin and zosyn, however now patient transitioned to comfort care -Discussed removing tube with IR, Dr. Pascal Lux, recommended leaving it in  Leukocytosis -Likely due to the above -As above  Back pain likely due to compression fraction at T12/L5 -IR suggests vertebroplasty when diverticular abscess is controlled -Pain fairly controlled  Paroxysmal atrial fibrillation -Currently in sinus rhythm, continue amiodarone, Coreg -given that patient is now comfort care- Eliquis held  Chronic diastolic CHF -Currently stable and euvolemic -Continue homeLasix  Acute kidney injury on chronic kidney disease, stage IV -AKI resolved -Baseline creatinine appears to be 1.6-1.8 upon review of patient's chart dating back to 2019  Anemia of chronic disease -Hemoglobin stable  Acute respiratory failure with hypoxia -Resolved  Goals of care  -Palliative care consulted and appreciated -currently DNR -family has opted to take the patient home with  Hospice and transitioned to comfort care  Consultants Interventional radiology Inpatient rehab Infectious disease, via phone by Dr. Cyndia Skeeters General surgery Palliative care  Procedures  CT-guided drainage of pelvic abscess, drain placement by interventional radiology   Discharge Exam: Vitals:   12/04/18 0900 12/04/18 1136  BP:  (!) 143/61  Pulse:  72  Resp:  20  Temp: 100.3 F (37.9 C) 98.9 F (37.2 C)  SpO2:  100%     General: Well developed, chronically ill appearing, NAD  HEENT: NCAT, mucous membranes moist.  Cardiovascular: S1 S2  auscultated, RRR  Respiratory: Clear to auscultation bilaterally  Abdomen:  Soft, nontender, nondistended, + bowel sounds  Extremities: warm dry without cyanosis clubbing or edema  Discharge Instructions  Allergies as of 12/05/2018      Reactions   Augmentin [amoxicillin-pot Clavulanate] Diarrhea, Nausea Only   Boniva [ibandronic Acid] Diarrhea   Codeine Other (See Comments)   "Spaced out feeling" and Lightheadedness   Hydromorphone Nausea And Vomiting   Latex Rash   "Burns me"   Lisinopril Cough   Amoxicillin    Clavulanic Acid    Adhesive [tape] Itching, Rash      Medication List    STOP taking these medications   amiodarone 200 MG tablet Commonly known as: PACERONE   apixaban 2.5 MG Tabs tablet Commonly known as: Eliquis   atorvastatin 10 MG tablet Commonly known as: LIPITOR   carvedilol 3.125 MG tablet Commonly known as: COREG   furosemide 20 MG tablet Commonly known as: LASIX   mirtazapine 15 MG tablet Commonly known as: REMERON   potassium chloride 10 MEQ tablet Commonly known as: K-DUR   predniSONE 10 MG tablet Commonly known as: DELTASONE     TAKE these medications   acetaminophen 500 MG tablet Commonly known as: TYLENOL Take 1,000 mg by mouth every 8 (eight) hours as needed (for pain).   Flutter Devi Use as directed.   HYDROcodone-acetaminophen 5-325 MG tablet Commonly known as: NORCO/VICODIN Take 1 tablet by mouth 2 (two) times daily as needed for pain.   latanoprost 0.005 % ophthalmic solution Commonly known as: XALATAN Place 1 drop into both eyes at bedtime.   LORazepam 1 MG tablet Commonly known as: ATIVAN Take 1 tablet (1 mg total) by mouth every 4 (four) hours as needed for anxiety.   morphine CONCENTRATE 10 MG/0.5ML Soln concentrated solution Take 0.25 mLs (5 mg total) by mouth every 2 (two) hours as needed for moderate pain or shortness of breath.   nitroGLYCERIN 0.4 MG SL tablet Commonly known as: NITROSTAT Place 1 tablet  (0.4 mg total) under the tongue every 5 (five) minutes as needed for chest pain.   ondansetron 4 MG tablet Commonly known as: ZOFRAN Take 1 tablet (4 mg total) by mouth every 6 (six) hours as needed for nausea.   polyethylene glycol 17 g packet Commonly known as: MIRALAX / GLYCOLAX Take 17 g by mouth daily as needed.      Allergies  Allergen Reactions   Augmentin [Amoxicillin-Pot Clavulanate] Diarrhea and Nausea Only   Boniva [Ibandronic Acid] Diarrhea   Codeine Other (See Comments)    "Spaced out feeling" and Lightheadedness   Hydromorphone Nausea And Vomiting   Latex Rash    "Burns me"   Lisinopril Cough   Amoxicillin    Clavulanic Acid    Adhesive [Tape] Itching and Rash    Contact information for follow-up providers    Kinsinger, Arta Bruce, MD Follow up on 12/22/2018.   Specialty: General Surgery Why: 3:40pm, arrive by 3:10pm for paperwork and check in Contact information: [place] [address] [phone]        Markus Daft, MD Follow up in 10 day(s).   Specialties: Interventional Radiology, Radiology Why: follow up diverticular abscess drain 7-10 days in [facility]; pt will hear from scheduler for time and date; call [phone] if questions or needs Contact information: [address] [place] [address] [phone]            Contact information for after-discharge care    Destination    [facility] Preferred SNF .   Service: Skilled Nursing Contact information: [place] [phone]                   The results of significant diagnostics from this hospitalization (including imaging, microbiology, ancillary and laboratory) are listed below for reference.    Significant Diagnostic Studies: Ct Abdomen Pelvis Wo Contrast  Result Date: 11/25/2018 CLINICAL DATA:  Diverticulitis in the rectosigmoid colon with adjacent area of  abscess EXAM: CT ABDOMEN AND PELVIS WITHOUT CONTRAST TECHNIQUE: Multidetector CT imaging of the abdomen and pelvis was performed following the standard protocol without IV contrast. COMPARISON:  MRI 11/24/2018, CT 10/15/2017 FINDINGS: Lower chest: Lung bases demonstrate dependent atelectasis. Mild bronchiectasis in the right middle lobe and bilateral lung bases with mild bronchial wall thickening. Borderline to mild cardiomegaly. Dense mitral calcification. Coronary vascular calcifications. Hepatobiliary: Liver appears increased in density. Small stones in the gallbladder. No biliary dilatation. Pancreas: Unremarkable. No pancreatic ductal dilatation or surrounding inflammatory changes. Spleen: Within normal limits Adrenals/Urinary Tract: Adrenal glands are within normal limits. Kidneys show no hydronephrosis. Multiple bilateral renal cysts, many of which are exophytic. 17 mm lower pole slightly complex cyst demonstrates increased density values internally. Urinary bladder is unremarkable Stomach/Bowel: Stomach nonenlarged. Small hiatal hernia. No dilated small. Negative appendix. Extensive diverticular disease of the sigmoid colon. Mild wall thickening of the sigmoid colon but without much inflammatory change. 4.2 x 3.7 cm gas and fluid collection along the left aspect of the sigmoid colon, new as compared with prior CT from 10/15/2017 and presumably representing a diverticular abscess. Vascular/Lymphatic: Extensive aortic atherosclerosis. No significantly enlarged lymph nodes. Reproductive: Status post hysterectomy. No adnexal masses. Other: Negative for free air or free fluid. Musculoskeletal: Kyphosis of the spine. Severe chronic compression fracture T12. Treated compression fractures L1 through L4. Acute fracture through the superior endplate L5, without significant loss of vertebral body height. Healing sacral fracture. IMPRESSION: 1. Extensive sigmoid colon diverticular disease. Mild wall thickening and soft  tissue thickening at the sigmoid colon. 4.2 x 3.7 cm gas and fluid collection along the left aspect of the sigmoid colon, new since CT from 10/15/2017 and presumably representing diverticular abscess. 2. Acute fracture through superior endplate of L5. Multiple chronic compression fractures of the thoracic and lumbar spine 3. Slightly dense liver, possibly related to amiodarone use 4. 17 mm mass off the lower pole right kidney, grossly stable in size but slightly more complex in the interim, with increased density values. This could be due to interval hemorrhage or proteinaceous debris. Attention on follow-up imaging suggested. Electronically Signed   By: Donavan Foil M.D.   On: 11/25/2018 02:48   Ct Head Wo Contrast  Result Date: 12/04/2018 CLINICAL DATA:  Facial weakness EXAM: CT HEAD WITHOUT CONTRAST TECHNIQUE: Contiguous axial images were obtained from the base of the skull through the vertex without intravenous contrast. COMPARISON:  01/26/2018 FINDINGS: Brain: There is atrophy and chronic small vessel disease changes. Associated ventriculomegaly. No hemorrhage or acute infarction. No midline shift. Vascular: No hyperdense vessel or unexpected calcification. Skull: No acute calvarial abnormality. Sinuses/Orbits: No acute finding Other: None IMPRESSION: Atrophy, chronic microvascular disease. No acute intracranial abnormality. Electronically Signed   By: Rolm Baptise M.D.   On: 12/04/2018 09:45   Ct Cervical Spine Wo Contrast  Result Date: 11/20/2018 CLINICAL DATA:  Neck pain and chronic back pain. EXAM: CT CERVICAL, THORACIC, AND LUMBAR SPINE WITHOUT CONTRAST TECHNIQUE: Multidetector CT imaging of the cervical, thoracic  and lumbar spine was performed without intravenous contrast. Multiplanar CT image reconstructions were also generated. COMPARISON:  Chest CT January 26, 2018, CT of the lumbosacral spine December 12, 2017, CT of the abdomen December 24, 2016 FINDINGS: CT CERVICAL SPINE FINDINGS Alignment:  Slightly exaggerated cervical lordosis. Skull base and vertebrae: No acute fracture. No primary bone lesion or focal pathologic process. Osteopenia. Soft tissues and spinal canal: No prevertebral fluid or swelling. No visible canal hematoma. Disc levels: Mild multilevel osteoarthritic changes. Mild to moderate posterior facet arthropathy. Mild osseous neural foraminal narrowing of bilateral C3-C4, and right C4-C5. CT THORACIC SPINE FINDINGS Alignment: Exaggerated thoracic kyphosis. Vertebrae: Mild progression of the moderate compression fracture of T12 vertebral body with approximately 70% height loss centrally, with minimal retropulsion to the spinal canal. Paraspinal and other soft tissues: Negative. Disc levels: Diffuse spondylosis of the thoracic spine. Stigmata of diffuse idiopathic skeletal hyperostosis of the thoracic spine. Chest: Bilateral lung dependent atelectasis. Enlarged heart. Bulky annular calcifications of the mitral and aortic valves. Calcific atherosclerotic disease of the coronary arteries. CT LUMBAR SPINE FINDINGS Segmentation: 5 lumbar type vertebrae. Alignment: Exaggerated lumbar lordosis. Stable 9 mm retropulsion associated with chronic L1 burst fracture. Vertebrae: Prior vertebroplasty of L1, L2, L3 and L4. No acute fractures are seen. Paraspinal and other soft tissues: The paraspinal soft tissues are normal. Tortuosity of the abdominal aorta with a saccular dilation of the proximal infrarenal abdominal aorta measuring 2.2 cm and a second saccular dilation of the more distal infrarenal aorta measuring 2.4 cm. Disc levels: Mild-to-moderate multilevel osteoarthritic changes with disc osteophyte complexes and posterior facet arthropathy. L1-L2, mild spinal stenosis posterior to L1 vertebral body due to retropulsion, stable. L2-L3, mild disc bulge and facet arthropathy. L3-L4, broad-based disc bulge with minimal spinal canal stenosis and bilateral neural foraminal stenosis. L4-L5, mild  broad-based disc bulge with mild bilateral neural foraminal stenosis. L5-S1, mild right neural foraminal stenosis. IMPRESSION: 1. Osteopenia.  No evidence of acute traumatic injury to the spine. 2. Mild progression of the compression fracture of T12 vertebral body with approximately 70% height loss centrally, and mild retropulsion into the spinal canal. 3. Prior vertebroplasty of L1, L2, L3 and L4. Stable 9 mm retropulsion associated with the chronic L1 burst fracture. 4. Mild bilateral neural foraminal stenosis at C3-C4, C4-C5, and L3-L4, L4-L5 and L5-S1 due to osteoarthritic changes. 5. Enlarged heart with extensive annular calcifications of the mitral and aortic valves. Calcific atherosclerotic disease of the coronary arteries and the aorta. 6. Tortuosity of the abdominal aorta with a saccular dilation of the proximal infrarenal abdominal aorta measuring 2.2 cm and a second saccular dilation of the more distal infrarenal aorta measuring 2.4 cm. Electronically Signed   By: Fidela Salisbury M.D.   On: 11/20/2018 20:36   Ct Thoracic Spine Wo Contrast  Result Date: 11/20/2018 CLINICAL DATA:  Neck pain and chronic back pain. EXAM: CT CERVICAL, THORACIC, AND LUMBAR SPINE WITHOUT CONTRAST TECHNIQUE: Multidetector CT imaging of the cervical, thoracic and lumbar spine was performed without intravenous contrast. Multiplanar CT image reconstructions were also generated. COMPARISON:  Chest CT January 26, 2018, CT of the lumbosacral spine December 12, 2017, CT of the abdomen December 24, 2016 FINDINGS: CT CERVICAL SPINE FINDINGS Alignment: Slightly exaggerated cervical lordosis. Skull base and vertebrae: No acute fracture. No primary bone lesion or focal pathologic process. Osteopenia. Soft tissues and spinal canal: No prevertebral fluid or swelling. No visible canal hematoma. Disc levels: Mild multilevel osteoarthritic changes. Mild to moderate posterior facet arthropathy. Mild osseous  neural foraminal narrowing of  bilateral C3-C4, and right C4-C5. CT THORACIC SPINE FINDINGS Alignment: Exaggerated thoracic kyphosis. Vertebrae: Mild progression of the moderate compression fracture of T12 vertebral body with approximately 70% height loss centrally, with minimal retropulsion to the spinal canal. Paraspinal and other soft tissues: Negative. Disc levels: Diffuse spondylosis of the thoracic spine. Stigmata of diffuse idiopathic skeletal hyperostosis of the thoracic spine. Chest: Bilateral lung dependent atelectasis. Enlarged heart. Bulky annular calcifications of the mitral and aortic valves. Calcific atherosclerotic disease of the coronary arteries. CT LUMBAR SPINE FINDINGS Segmentation: 5 lumbar type vertebrae. Alignment: Exaggerated lumbar lordosis. Stable 9 mm retropulsion associated with chronic L1 burst fracture. Vertebrae: Prior vertebroplasty of L1, L2, L3 and L4. No acute fractures are seen. Paraspinal and other soft tissues: The paraspinal soft tissues are normal. Tortuosity of the abdominal aorta with a saccular dilation of the proximal infrarenal abdominal aorta measuring 2.2 cm and a second saccular dilation of the more distal infrarenal aorta measuring 2.4 cm. Disc levels: Mild-to-moderate multilevel osteoarthritic changes with disc osteophyte complexes and posterior facet arthropathy. L1-L2, mild spinal stenosis posterior to L1 vertebral body due to retropulsion, stable. L2-L3, mild disc bulge and facet arthropathy. L3-L4, broad-based disc bulge with minimal spinal canal stenosis and bilateral neural foraminal stenosis. L4-L5, mild broad-based disc bulge with mild bilateral neural foraminal stenosis. L5-S1, mild right neural foraminal stenosis. IMPRESSION: 1. Osteopenia.  No evidence of acute traumatic injury to the spine. 2. Mild progression of the compression fracture of T12 vertebral body with approximately 70% height loss centrally, and mild retropulsion into the spinal canal. 3. Prior vertebroplasty of L1, L2, L3  and L4. Stable 9 mm retropulsion associated with the chronic L1 burst fracture. 4. Mild bilateral neural foraminal stenosis at C3-C4, C4-C5, and L3-L4, L4-L5 and L5-S1 due to osteoarthritic changes. 5. Enlarged heart with extensive annular calcifications of the mitral and aortic valves. Calcific atherosclerotic disease of the coronary arteries and the aorta. 6. Tortuosity of the abdominal aorta with a saccular dilation of the proximal infrarenal abdominal aorta measuring 2.2 cm and a second saccular dilation of the more distal infrarenal aorta measuring 2.4 cm. Electronically Signed   By: Fidela Salisbury M.D.   On: 11/20/2018 20:36   Ct Lumbar Spine Wo Contrast  Result Date: 11/20/2018 CLINICAL DATA:  Neck pain and chronic back pain. EXAM: CT CERVICAL, THORACIC, AND LUMBAR SPINE WITHOUT CONTRAST TECHNIQUE: Multidetector CT imaging of the cervical, thoracic and lumbar spine was performed without intravenous contrast. Multiplanar CT image reconstructions were also generated. COMPARISON:  Chest CT January 26, 2018, CT of the lumbosacral spine December 12, 2017, CT of the abdomen December 24, 2016 FINDINGS: CT CERVICAL SPINE FINDINGS Alignment: Slightly exaggerated cervical lordosis. Skull base and vertebrae: No acute fracture. No primary bone lesion or focal pathologic process. Osteopenia. Soft tissues and spinal canal: No prevertebral fluid or swelling. No visible canal hematoma. Disc levels: Mild multilevel osteoarthritic changes. Mild to moderate posterior facet arthropathy. Mild osseous neural foraminal narrowing of bilateral C3-C4, and right C4-C5. CT THORACIC SPINE FINDINGS Alignment: Exaggerated thoracic kyphosis. Vertebrae: Mild progression of the moderate compression fracture of T12 vertebral body with approximately 70% height loss centrally, with minimal retropulsion to the spinal canal. Paraspinal and other soft tissues: Negative. Disc levels: Diffuse spondylosis of the thoracic spine. Stigmata of  diffuse idiopathic skeletal hyperostosis of the thoracic spine. Chest: Bilateral lung dependent atelectasis. Enlarged heart. Bulky annular calcifications of the mitral and aortic valves. Calcific atherosclerotic disease of the coronary arteries. CT  LUMBAR SPINE FINDINGS Segmentation: 5 lumbar type vertebrae. Alignment: Exaggerated lumbar lordosis. Stable 9 mm retropulsion associated with chronic L1 burst fracture. Vertebrae: Prior vertebroplasty of L1, L2, L3 and L4. No acute fractures are seen. Paraspinal and other soft tissues: The paraspinal soft tissues are normal. Tortuosity of the abdominal aorta with a saccular dilation of the proximal infrarenal abdominal aorta measuring 2.2 cm and a second saccular dilation of the more distal infrarenal aorta measuring 2.4 cm. Disc levels: Mild-to-moderate multilevel osteoarthritic changes with disc osteophyte complexes and posterior facet arthropathy. L1-L2, mild spinal stenosis posterior to L1 vertebral body due to retropulsion, stable. L2-L3, mild disc bulge and facet arthropathy. L3-L4, broad-based disc bulge with minimal spinal canal stenosis and bilateral neural foraminal stenosis. L4-L5, mild broad-based disc bulge with mild bilateral neural foraminal stenosis. L5-S1, mild right neural foraminal stenosis. IMPRESSION: 1. Osteopenia.  No evidence of acute traumatic injury to the spine. 2. Mild progression of the compression fracture of T12 vertebral body with approximately 70% height loss centrally, and mild retropulsion into the spinal canal. 3. Prior vertebroplasty of L1, L2, L3 and L4. Stable 9 mm retropulsion associated with the chronic L1 burst fracture. 4. Mild bilateral neural foraminal stenosis at C3-C4, C4-C5, and L3-L4, L4-L5 and L5-S1 due to osteoarthritic changes. 5. Enlarged heart with extensive annular calcifications of the mitral and aortic valves. Calcific atherosclerotic disease of the coronary arteries and the aorta. 6. Tortuosity of the abdominal  aorta with a saccular dilation of the proximal infrarenal abdominal aorta measuring 2.2 cm and a second saccular dilation of the more distal infrarenal aorta measuring 2.4 cm. Electronically Signed   By: Fidela Salisbury M.D.   On: 11/20/2018 20:36   Mr Lumbar Spine Wo Contrast  Result Date: 11/24/2018 CLINICAL DATA:  T12 compression fracture. EXAM: MRI LUMBAR SPINE WITHOUT CONTRAST TECHNIQUE: Multiplanar, multisequence MR imaging of the lumbar spine was performed. No intravenous contrast was administered. COMPARISON:  11/20/2018 CT FINDINGS: Segmentation:  Standard lumbar numbering Alignment: Kyphosis at the thoracolumbar junction related to chronic compression fractures. Vertebrae: Acute appearing compression fracture at L5 with marrow edema in the upper body where there is a horizontal fracture plane. Remote T12 compression fracture with unchanged advanced height loss and mild residual edema when compared to prior. Remote compression fracture at L1-L4 with completed healing at these levels. Cement augmentation performed at L1-L3. Sacral insufficiency fracture at S2 and bilateral sacral ala which is new from prior but only with minimal residual edema. Conus medullaris and cauda equina: Conus extends to the L2 level. Conus and cauda equina appear normal. Paraspinal and other soft tissues: Renal cystic intensities. Severe atrophy of intrinsic back muscles. Disc levels: Usual degenerative changes for age.  No degenerative impingement These results were called by telephone at the time of interpretation on 11/24/2018 at 11:43 am to Dr. Benny Lennert , who verbally acknowledged these results. IMPRESSION: 1. L5 acute or early subacute compression fracture, suspect this is the most symptomatic level. 2. Remote T12 compression fracture with similar height loss to September 2019 and only mild residual edema. 3. Interval but nonacute bilateral sacral ala and S2 insufficiency fractures. 4. Additional incidental finding on  lumbar spine CT from 4 days ago, there is a partially covered mass in the left hemipelvis favoring diverticular abscess. Recommend abdomen and pelvis CT. Electronically Signed   By: Monte Fantasia M.D.   On: 11/24/2018 11:43   Ct Abdomen Pelvis W Contrast  Result Date: 12/03/2018 CLINICAL DATA:  Follow-up diverticulitis EXAM: CT ABDOMEN AND  PELVIS WITH CONTRAST TECHNIQUE: Multidetector CT imaging of the abdomen and pelvis was performed using the standard protocol following bolus administration of intravenous contrast. CONTRAST:  27mL ISOVUE-300 IOPAMIDOL (ISOVUE-300) INJECTION 61%, additional oral enteric contrast COMPARISON:  11/25/2018, CT-guided drain placement, 11/25/2018 FINDINGS: Lower chest: Bibasilar atelectasis or consolidation, similar compared to prior examination. Cardiomegaly, coronary artery disease, and dense mitral annular calcifications Hepatobiliary: No solid liver abnormality is seen. No gallstones, gallbladder wall thickening, or biliary dilatation. Pancreas: Unremarkable. No pancreatic ductal dilatation or surrounding inflammatory changes. Spleen: Normal in size without significant abnormality. Adrenals/Urinary Tract: Adrenal glands are unremarkable. Large, exophytic right renal cyst. Kidneys are otherwise normal, without renal calculi, solid lesion, or hydronephrosis. Bladder is unremarkable. Stomach/Bowel: Stomach is within normal limits. Redemonstrated sigmoid diverticulosis with wall thickening of the mid sigmoid and an air and fluid collection adjacent to the left aspect of the colon. There has been interval CT-guided drain placement with left anterior approach pigtail drainage catheter position within the collection, which is not significantly changed in size, measuring 3.5 cm. Vascular/Lymphatic: Aortic atherosclerosis. No enlarged abdominal or pelvic lymph nodes. Reproductive: Status post hysterectomy. Other: No abdominal wall hernia or abnormality. No abdominopelvic ascites.  Musculoskeletal: No acute or significant osseous findings. IMPRESSION: 1. Redemonstrated sigmoid diverticulosis with wall thickening of the mid sigmoid and an air and fluid collection adjacent to the left aspect of the colon. There has been interval CT-guided drain placement with left anterior approach pigtail drainage catheter position within the collection, which is not significantly changed in size, measuring 3.5 cm. 2. Other chronic, incidental, and postoperative findings as detailed above. Electronically Signed   By: Eddie Candle M.D.   On: 12/03/2018 14:20   Dg Chest Port 1 View  Result Date: 12/03/2018 CLINICAL DATA:  Leukocytosis,weak EXAM: PORTABLE CHEST 1 VIEW COMPARISON:  11/20/2018 FINDINGS: The heart is enlarged. Shallow lung inflation. Stable elevation of RIGHT hemidiaphragm. Thoracic aorta is tortuous and partially calcified. There is dense calcification of the mitral annulus. There is minimal bibasilar atelectasis. No focal consolidations, pleural effusions, or pulmonary edema. IMPRESSION: 1. Cardiomegaly. 2. Bibasilar atelectasis. 3. Lungs otherwise clear. Electronically Signed   By: Nolon Nations M.D.   On: 12/03/2018 09:31   Dg Chest Port 1 View  Result Date: 11/20/2018 CLINICAL DATA:  New oxygen requirement EXAM: PORTABLE CHEST 1 VIEW COMPARISON:  January 27, 2018 FINDINGS: There is elevation right hemidiaphragm. Opacity in left base could represent atelectasis or infiltrate with obscuration left hemidiaphragm. The cardiomediastinal silhouette is stable. No pneumothorax. IMPRESSION: Opacity in the left base obscuring left hemidiaphragm could represent atelectasis or infiltrate. Recommend follow-up to resolution. No other interval changes. Electronically Signed   By: Dorise Bullion III M.D   On: 11/20/2018 21:54   Ct Image Guided Drainage By Percutaneous Catheter  Result Date: 11/26/2018 INDICATION: 85 year old with a colonic diverticular abscess. Plan for CT-guided drain  placement. EXAM: CT GUIDED DRAINAGE OF PELVIC ABSCESS MEDICATIONS: Moderate sedation ANESTHESIA/SEDATION: 1.5 mg IV Versed 150 mcg IV Fentanyl Moderate Sedation Time:  33 minutes The patient was continuously monitored during the procedure by the interventional radiology nurse under my direct supervision. COMPLICATIONS: None immediate. TECHNIQUE: Informed written consent was obtained from the patient after a thorough discussion of the procedural risks, benefits and alternatives. All questions were addressed. Maximal Sterile Barrier Technique was utilized including caps, mask, sterile gowns, sterile gloves, sterile drape, hand hygiene and skin antiseptic. A timeout was performed prior to the initiation of the procedure. PROCEDURE: Patient was supine on the CT  scanner and images through the pelvis were obtained. The abscess along the left pelvic sidewall was targeted. The left lower abdomen/pelvic region was prepped with chlorhexidine and sterile field was created. Skin and soft tissues were anesthetized with 1% lidocaine. Using CT guidance, 18 gauge trocar needle was directed into the abscess collection. Wire was advanced into the abscess. Follow up CT images were obtained to confirm the wire was not traversing bowel or large vessel. Tract was dilated to accommodate a 10.2 [place] multipurpose drain. 20 mL of thick bloody purulent fluid was aspirated from the collection. Follow up CT images were obtained. The drain was sutured to skin and attached to a suction bulb. FINDINGS: Irregular air-fluid collection in the left pelvis between the left external iliac vessels and the sigmoid colon. Drain was successfully advanced into the collection and 20 mL of purulent fluid was removed. Small hematoma along the anterior abdominal wall at the catheter entrance site. This small hematoma did not significantly change on the follow up imaging. IMPRESSION: CT-guided placement of a drain in the left pelvic abscess. Electronically  Signed   By: Markus Daft M.D.   On: 11/26/2018 14:50    Microbiology: Recent Results (from the past 240 hour(s))  Surgical pcr screen     Status: Abnormal   Collection Time: 11/25/18 10:58 PM   Specimen: Nasal Mucosa; Nasal Swab  Result Value Ref Range Status   MRSA, PCR POSITIVE (A) NEGATIVE Final   Staphylococcus aureus POSITIVE (A) NEGATIVE Final    Comment: RESULT CALLED TO, READ BACK BY AND VERIFIED WITH: C ROBINSON RN 11/26/18 0125 JDW (NOTE) The Xpert SA Assay (FDA approved for NASAL specimens in patients 40 years of age and older), is one component of a comprehensive surveillance program. It is not intended to diagnose infection nor to guide or monitor treatment. Performed at [facility], [place] [address] [place], [place] [address]   Aerobic/Anaerobic Culture (surgical/deep wound)     Status: None   Collection Time: 11/26/18  1:48 PM   Specimen: Abscess  Result Value Ref Range Status   Specimen Description ABSCESS PERICOLONIC  Final   Special Requests NONE  Final   Gram Stain   Final    FEW WBC PRESENT, PREDOMINANTLY PMN FEW GRAM POSITIVE RODS FEW GRAM POSITIVE COCCI IN CLUSTERS    Culture   Final    MODERATE ENTEROCOCCUS FAECALIS FEW ESCHERICHIA COLI FEW BACTEROIDES FRAGILIS FEW BACTEROIDES OVATUS BETA LACTAMASE POSITIVE Performed at [facility], [place] [address] [place], [place] [address]    Report Status 12/01/2018 FINAL  Final   Organism ID, Bacteria ENTEROCOCCUS FAECALIS  Final   Organism ID, Bacteria ESCHERICHIA COLI  Final      Susceptibility   Escherichia coli - MIC*    AMPICILLIN >=32 RESISTANT Resistant     CEFAZOLIN <=4 SENSITIVE Sensitive     CEFEPIME <=1 SENSITIVE Sensitive     CEFTAZIDIME <=1 SENSITIVE Sensitive     CEFTRIAXONE <=1 SENSITIVE Sensitive     CIPROFLOXACIN >=4 RESISTANT Resistant     GENTAMICIN <=1 SENSITIVE Sensitive     IMIPENEM <=0.25 SENSITIVE Sensitive     TRIMETH/SULFA <=20 SENSITIVE Sensitive      AMPICILLIN/SULBACTAM >=32 RESISTANT Resistant     PIP/TAZO <=4 SENSITIVE Sensitive     Extended ESBL NEGATIVE Sensitive     * FEW ESCHERICHIA COLI   Enterococcus faecalis - MIC*    AMPICILLIN <=2 SENSITIVE Sensitive     VANCOMYCIN 1 SENSITIVE Sensitive  GENTAMICIN SYNERGY SENSITIVE Sensitive     * MODERATE ENTEROCOCCUS FAECALIS  Novel Coronavirus, NAA (hospital order; send-out to ref lab)     Status: None   Collection Time: 12/01/18  1:15 PM   Specimen: Nasopharyngeal Swab; Respiratory  Result Value Ref Range Status   SARS-CoV-2, NAA NOT DETECTED NOT DETECTED Final    Comment: (NOTE) This test was developed and its performance characteristics determined by [employer]. This test has not been FDA cleared or approved. This test has been authorized by FDA under an Emergency Use Authorization (EUA). This test is only authorized for the duration of time the declaration that circumstances exist justifying the authorization of the emergency use of in vitro diagnostic tests for detection of SARS-CoV-2 virus and/or diagnosis of COVID-19 infection under section 564(b)(1) of the Act, 21 U.S.C. 604VWU-9(W)(1), unless the authorization is terminated or revoked sooner. When diagnostic testing is negative, the possibility of a false negative result should be considered in the context of a patient's recent exposures and the presence of clinical signs and symptoms consistent with COVID-19. An individual without symptoms of COVID-19 and who is not shedding SARS-CoV-2 virus would expect to have a negative (not detected) result in this assay. Performed  At: [facility] [address] [place], [place] [id number] Rush Farmer MD AO:[id number]    [place]  Final    Comment: Performed at [facility], [place] [address] [place], [place] [address]  Culture, blood (routine x 2)     Status: None (Preliminary result)   Collection Time: 12/04/18  1:44 PM    Specimen: BLOOD LEFT HAND  Result Value Ref Range Status   Specimen Description BLOOD LEFT HAND  Final   Special Requests   Final    AEROBIC BOTTLE ONLY Blood Culture results may not be optimal due to an inadequate volume of blood received in culture bottles   Culture   Final    NO GROWTH < 24 HOURS Performed at [facility], [place] [address], [place], [place] [address]    Report Status PENDING  Incomplete     Labs: Basic Metabolic Panel: Recent Labs  Lab 11/29/18 1449 12/01/18 0424 12/02/18 0346 12/03/18 0918 12/04/18 0524  NA 139 141 141 138 135  K 5.2* 4.7 4.7 5.0 5.0  CL 101 102 101 100 96*  CO2 30 31 32 29 28  GLUCOSE 117* 108* 94 110* 95  BUN 15 20 22  27* 24*  CREATININE 1.74* 1.63* 1.84* 1.52* 1.37*  CALCIUM 9.0 8.8* 9.0 8.6* 8.9  MG 2.0 2.2 2.2  --   --   PHOS 3.1  --   --   --   --    Liver Function Tests: Recent Labs  Lab 11/29/18 1449  ALBUMIN 2.1*   No results for input(s): LIPASE, AMYLASE in the last 168 hours. No results for input(s): AMMONIA in the last 168 hours. CBC: Recent Labs  Lab 11/29/18 1449  11/30/18 0254 11/30/18 1156 12/01/18 0523 12/03/18 0500 12/04/18 0524  WBC 12.6*  --   --   --  12.6* 17.4* 19.7*  NEUTROABS 10.5*  --   --   --   --   --   --   HGB 12.7   < > 12.1 12.3 12.4 12.1 12.4  HCT 43.2   < > 40.4 41.7 41.7 40.6 41.1  MCV 94.1  --   --   --  94.3 93.3 92.4  PLT 237  --   --   --  230 241 277   < > = values in this interval not displayed.   Cardiac Enzymes: No results for input(s): CKTOTAL, CKMB, CKMBINDEX, TROPONINI in the last 168 hours. BNP: BNP (last 3 results) Recent Labs    01/26/18 0307 11/20/18 2126  BNP 384.8* 385.1*    ProBNP (last 3 results) No results for input(s): PROBNP in the last 8760 hours.  CBG: No results for input(s): GLUCAP in the last 168 hours.     Signed:  Cristal Ford  [facility] 12/05/2018, 11:04 AM

## 2018-12-07 ENCOUNTER — Other Ambulatory Visit: Payer: Self-pay | Admitting: General Surgery

## 2018-12-07 DIAGNOSIS — K5720 Diverticulitis of large intestine with perforation and abscess without bleeding: Secondary | ICD-10-CM

## 2018-12-09 LAB — CULTURE, BLOOD (ROUTINE X 2): Culture: NO GROWTH

## 2018-12-12 DEATH — deceased

## 2018-12-17 ENCOUNTER — Other Ambulatory Visit: Payer: Medicare Other

## 2019-02-12 ENCOUNTER — Telehealth: Payer: Self-pay | Admitting: Internal Medicine

## 2019-02-12 NOTE — Telephone Encounter (Signed)
LVM for pt to return call regarding insurance information, what she provided [place] at last visit is no longer active and we are not able to get her prolia approved without this information

## 2019-02-15 ENCOUNTER — Telehealth: Payer: Self-pay | Admitting: Internal Medicine

## 2019-02-15 NOTE — Telephone Encounter (Signed)
FYI

## 2019-02-15 NOTE — Telephone Encounter (Signed)
Placed on your desk. 

## 2019-02-15 NOTE — Telephone Encounter (Signed)
Patient's husband [name] called to let Dr. Kelton Pillar know that Innocence passed away on 2022/12/16

## 2019-03-25 ENCOUNTER — Ambulatory Visit: Payer: Medicare Other | Admitting: Internal Medicine

## 2020-11-14 IMAGING — CT CT CERVICAL SPINE WITHOUT CONTRAST
3 series · 12 of 33 positions shown, 14 images · non-contrast
Comparison: Chest CT January 26, 2018, CT of the lumbosacral
spine December 12, 2017, CT of the abdomen December 24, 2016

CLINICAL DATA: Neck pain and chronic back pain.

EXAM:
CT CERVICAL, THORACIC, AND LUMBAR SPINE WITHOUT CONTRAST
TECHNIQUE: Multidetector CT imaging of the cervical, thoracic and lumbar spine
was performed without intravenous contrast. Multiplanar CT image
reconstructions were also generated.

[Series 5: c spine soft · axial · 0.42mm/px · z∈[-249,-91]mm · 4 of 115 slices shown, 5 images]
[im 18/115  soft-tissue]
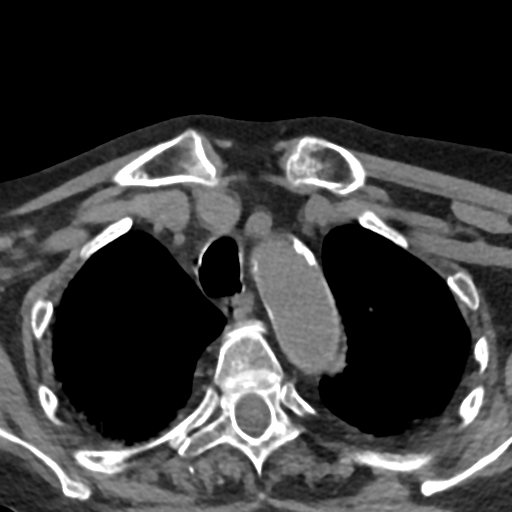
[im 18/115  bone]
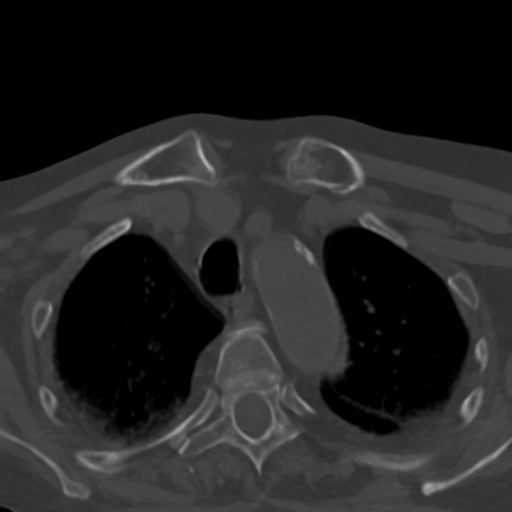
[im 44/115  bone]
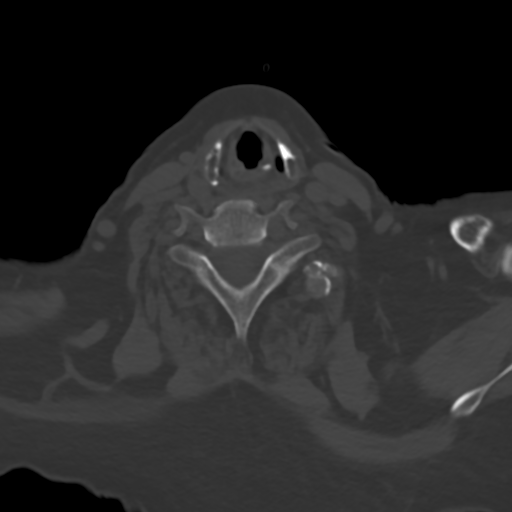
[im 71/115  bone]
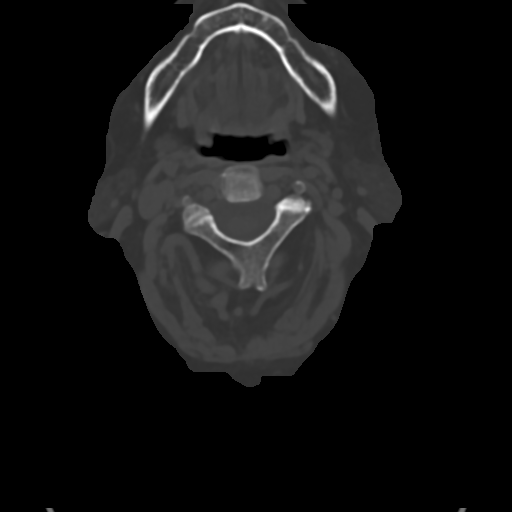
[im 97/115  bone]
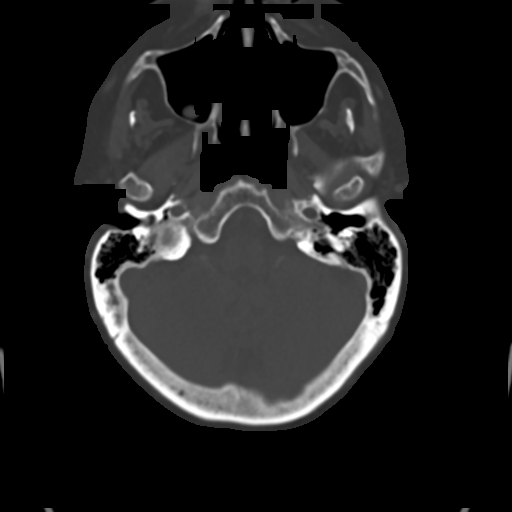

[Series 6: sag bone · sagittal · 0.34mm/px · 5 of 61 slices shown, 6 images]
[im 21/61  bone]
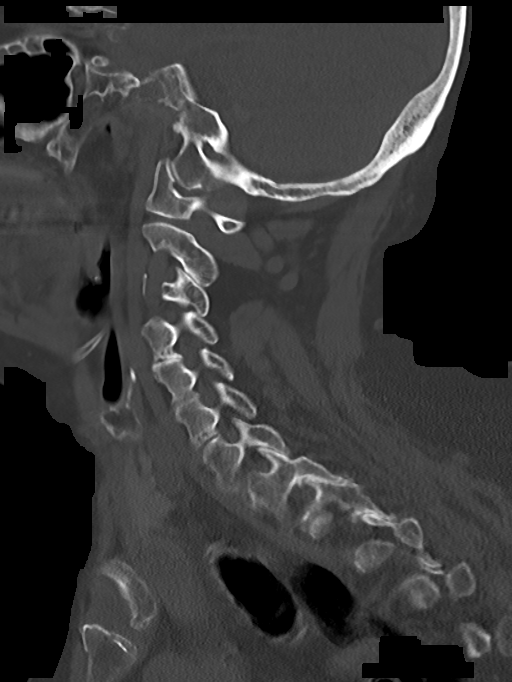
[im 26/61  bone]
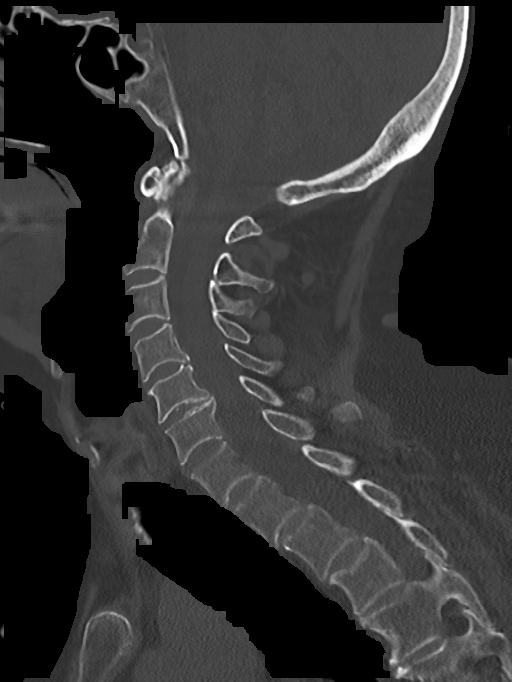
[im 31/61  soft-tissue]
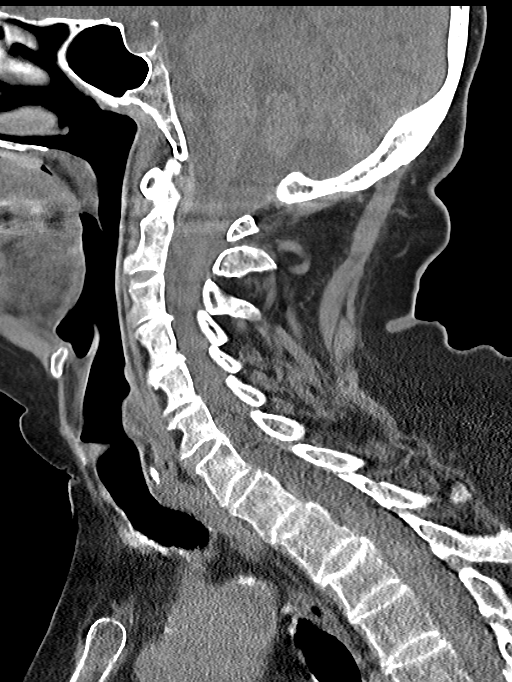
[im 31/61  bone]
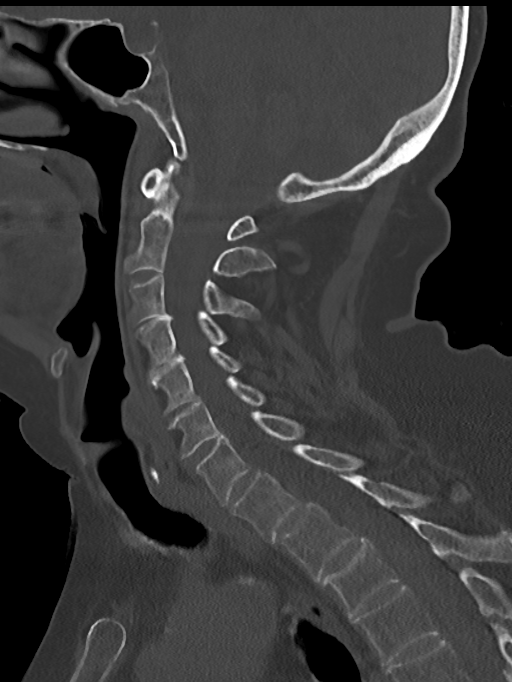
[im 36/61  bone]
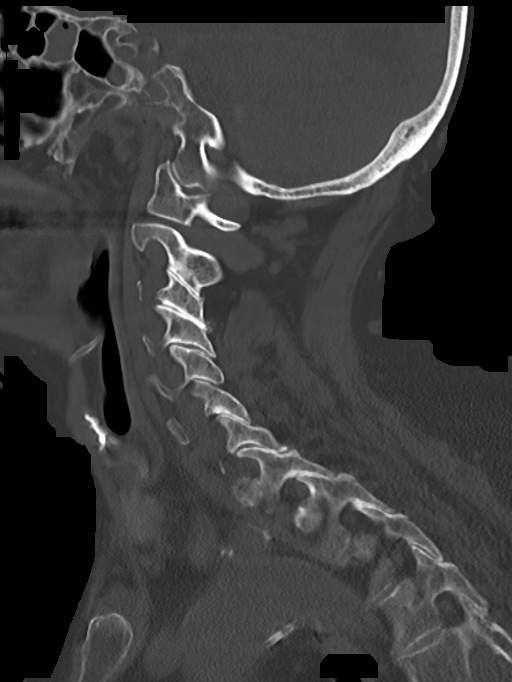
[im 41/61  bone]
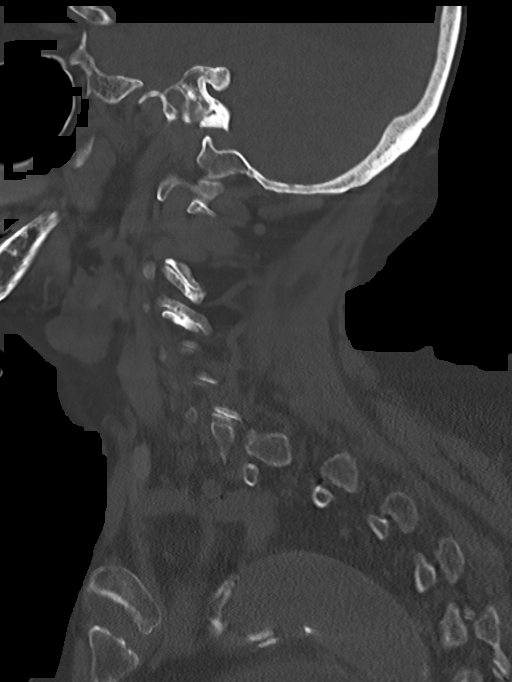

[Series 7: cor bone · coronal · 0.34mm/px · 3 of 93 slices shown]
[im 21/93  bone]
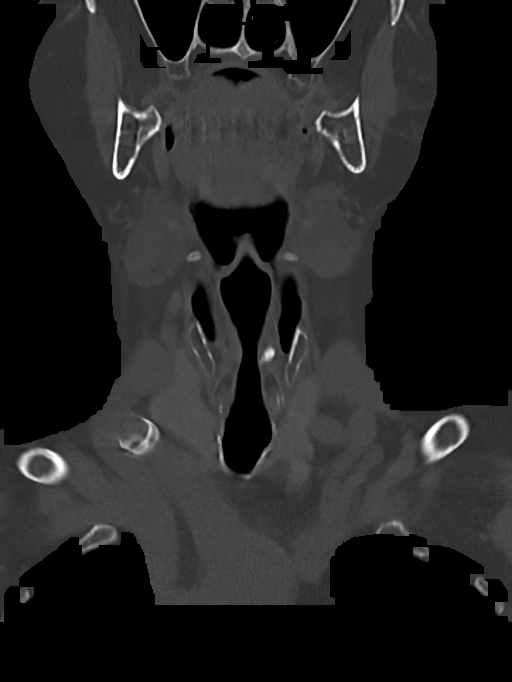
[im 38/93  bone]
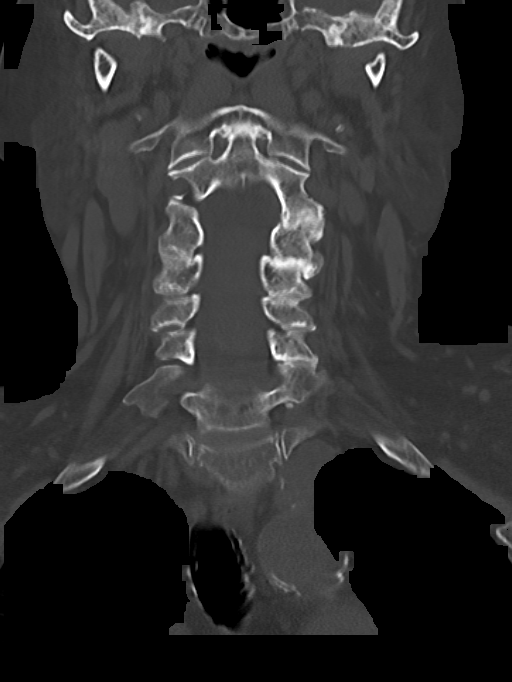
[im 55/93  bone]
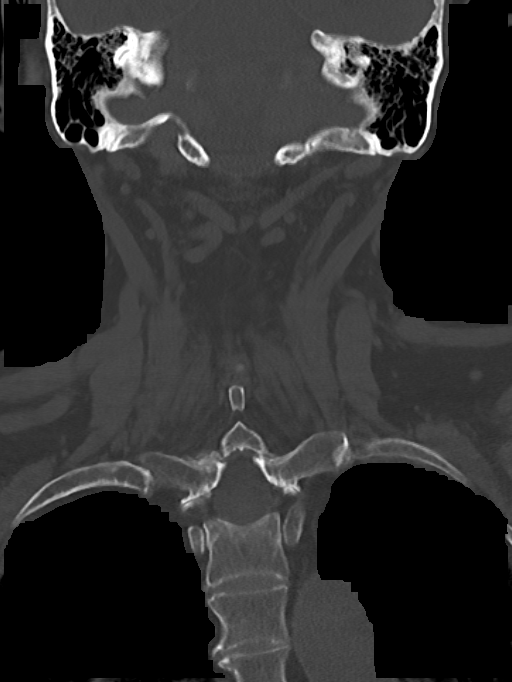

[12 of 33 positions shown; findings below may reference images not displayed]

FINDINGS: CT CERVICAL SPINE FINDINGS

Alignment: Slightly exaggerated cervical lordosis.

Skull base and vertebrae: No acute fracture. No primary bone lesion
or focal pathologic process. Osteopenia.

Soft tissues and spinal canal: No prevertebral fluid or swelling. No
visible canal hematoma.

Disc levels: Mild multilevel osteoarthritic changes. Mild to
moderate posterior facet arthropathy. Mild osseous neural foraminal
narrowing of bilateral C3-C4, and right C4-C5.

CT THORACIC SPINE FINDINGS

Alignment: Exaggerated thoracic kyphosis.

Vertebrae: Mild progression of the moderate compression fracture of
T12 vertebral body with approximately 70% height loss centrally,
with minimal retropulsion to the spinal canal.

Paraspinal and other soft tissues: Negative.

Disc levels: Diffuse spondylosis of the thoracic spine. Stigmata of
diffuse idiopathic skeletal hyperostosis of the thoracic spine.

Chest: Bilateral lung dependent atelectasis. Enlarged heart. Bulky
annular calcifications of the mitral and aortic valves. Calcific
atherosclerotic disease of the coronary arteries.

CT LUMBAR SPINE FINDINGS

Segmentation: 5 lumbar type vertebrae.

Alignment: Exaggerated lumbar lordosis. Stable 9 mm retropulsion
associated with chronic L1 burst fracture.

Vertebrae: Prior vertebroplasty of L1, L2, L3 and L4. No acute
fractures are seen.

Paraspinal and other soft tissues: The paraspinal soft tissues are
normal. Tortuosity of the abdominal aorta with a saccular dilation
of the proximal infrarenal abdominal aorta measuring 2.2 cm and a
second saccular dilation of the more distal infrarenal aorta
measuring 2.4 cm.

Disc levels: Mild-to-moderate multilevel osteoarthritic changes with
disc osteophyte complexes and posterior facet arthropathy. L1-L2,
mild spinal stenosis posterior to L1 vertebral body due to
retropulsion, stable. L2-L3, mild disc bulge and facet arthropathy.
L3-L4, broad-based disc bulge with minimal spinal canal stenosis and
bilateral neural foraminal stenosis. L4-L5, mild broad-based disc
bulge with mild bilateral neural foraminal stenosis. L5-S1, mild
right neural foraminal stenosis.
IMPRESSION: 1. Osteopenia.  No evidence of acute traumatic injury to the spine.
2. Mild progression of the compression fracture of T12 vertebral
body with approximately 70% height loss centrally, and mild
retropulsion into the spinal canal.
3. Prior vertebroplasty of L1, L2, L3 and L4. Stable 9 mm
retropulsion associated with the chronic L1 burst fracture.
4. Mild bilateral neural foraminal stenosis at C3-C4, C4-C5, and
L3-L4, L4-L5 and L5-S1 due to osteoarthritic changes.
5. Enlarged heart with extensive annular calcifications of the
mitral and aortic valves. Calcific atherosclerotic disease of the
coronary arteries and the aorta.
6. Tortuosity of the abdominal aorta with a saccular dilation of the
proximal infrarenal abdominal aorta measuring 2.2 cm and a second
saccular dilation of the more distal infrarenal aorta measuring
cm.

## 2020-11-14 IMAGING — DX PORTABLE CHEST - 1 VIEW
1 series · 1 of 1 positions shown · non-contrast
Comparison: January 27, 2018

CLINICAL DATA: New oxygen requirement

EXAM:
PORTABLE CHEST 1 VIEW

[chest]
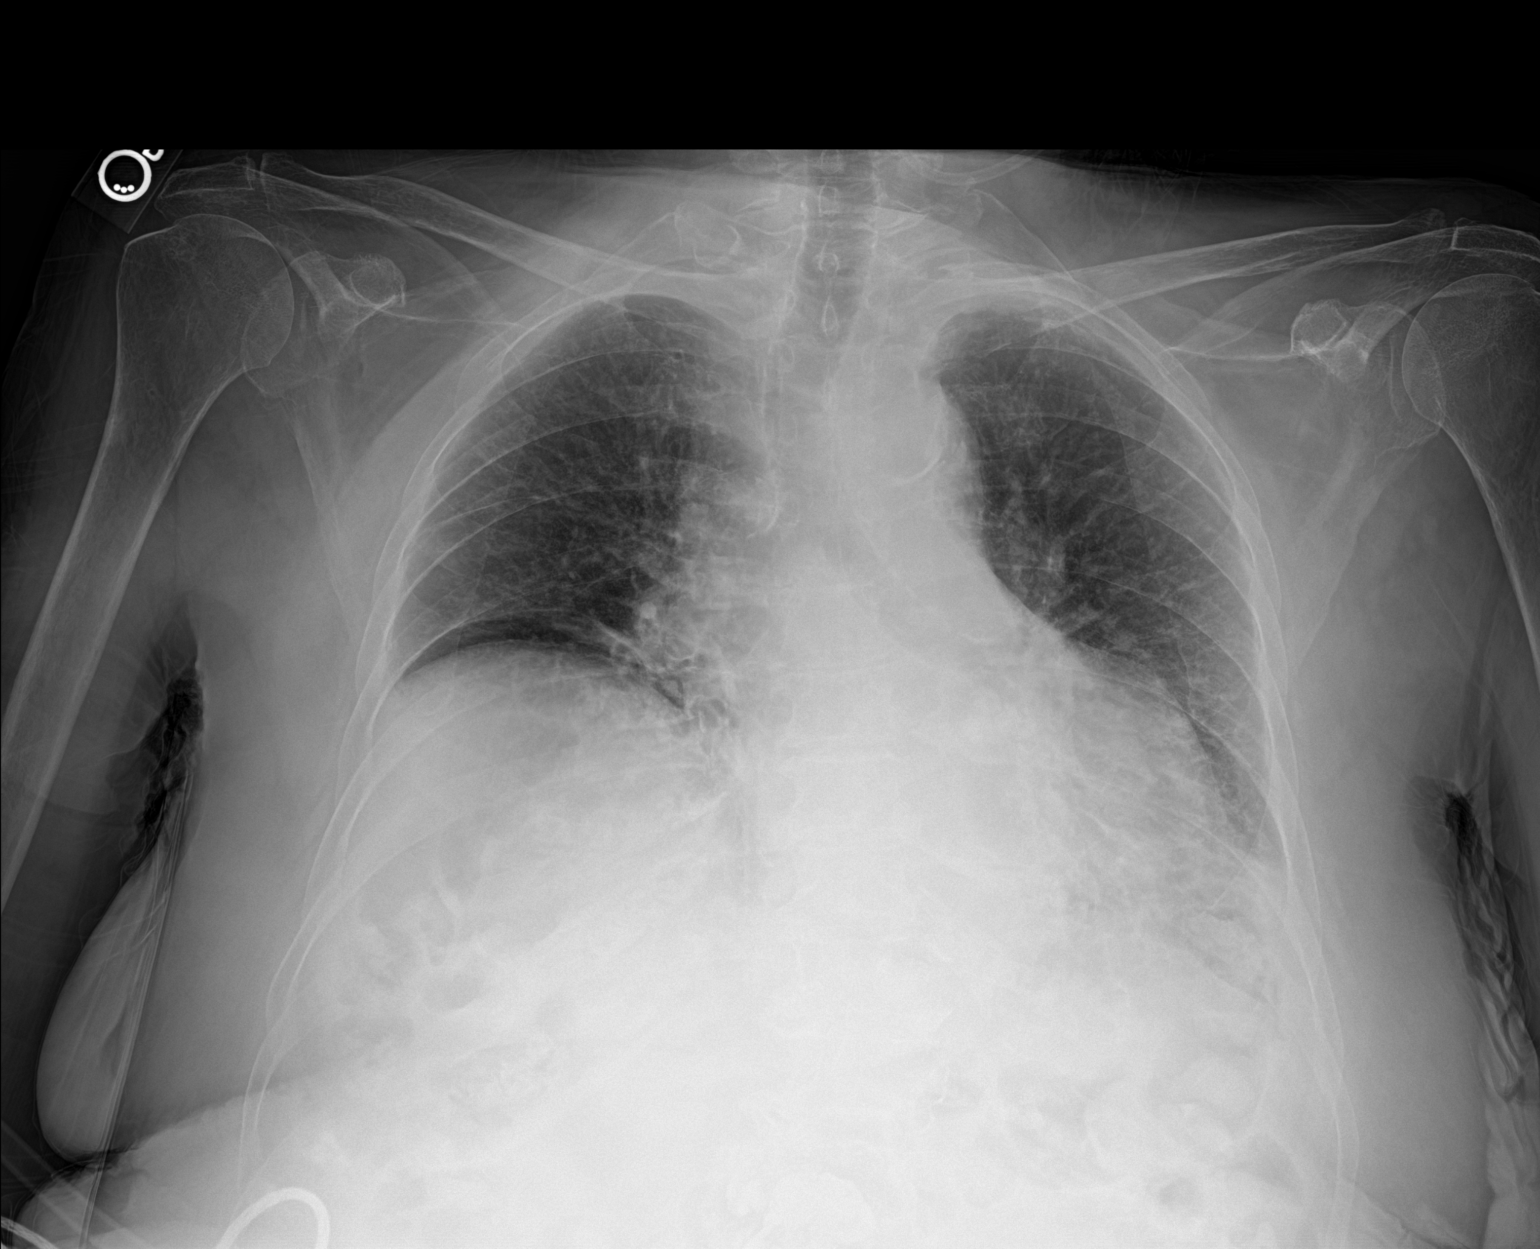

[1 of 1 positions shown; findings below may reference images not displayed]

FINDINGS: There is elevation right hemidiaphragm. Opacity in left base could
represent atelectasis or infiltrate with obscuration left
hemidiaphragm. The cardiomediastinal silhouette is stable. No
pneumothorax.
IMPRESSION: Opacity in the left base obscuring left hemidiaphragm could
represent atelectasis or infiltrate. Recommend follow-up to
resolution. No other interval changes.
